# Patient Record
Sex: Male | Born: 1980 | Race: Black or African American | Hispanic: No | Marital: Single | Smoking: Current some day smoker
Health system: Southern US, Community
[De-identification: ages and names within clinical notes are randomized; demographics above are authoritative.]

## PROBLEM LIST (undated history)

## (undated) DIAGNOSIS — I2699 Other pulmonary embolism without acute cor pulmonale: Secondary | ICD-10-CM

## (undated) DIAGNOSIS — J45909 Unspecified asthma, uncomplicated: Secondary | ICD-10-CM

## (undated) DIAGNOSIS — Z789 Other specified health status: Secondary | ICD-10-CM

## (undated) DIAGNOSIS — F1090 Alcohol use, unspecified, uncomplicated: Secondary | ICD-10-CM

## (undated) DIAGNOSIS — K219 Gastro-esophageal reflux disease without esophagitis: Secondary | ICD-10-CM

## (undated) DIAGNOSIS — R569 Unspecified convulsions: Secondary | ICD-10-CM

## (undated) DIAGNOSIS — F419 Anxiety disorder, unspecified: Secondary | ICD-10-CM

## (undated) DIAGNOSIS — I509 Heart failure, unspecified: Secondary | ICD-10-CM

## (undated) DIAGNOSIS — F32A Depression, unspecified: Secondary | ICD-10-CM

## (undated) DIAGNOSIS — Z7289 Other problems related to lifestyle: Secondary | ICD-10-CM

## (undated) HISTORY — DX: Depression, unspecified: F32.A

## (undated) HISTORY — DX: Anxiety disorder, unspecified: F41.9

## (undated) HISTORY — DX: Unspecified convulsions: R56.9

## (undated) HISTORY — PX: WRIST SURGERY: SHX841

## (undated) HISTORY — DX: Other pulmonary embolism without acute cor pulmonale: I26.99

## (undated) HISTORY — DX: Gastro-esophageal reflux disease without esophagitis: K21.9

---

## 2005-08-16 ENCOUNTER — Emergency Department (HOSPITAL_COMMUNITY): Admission: EM | Admit: 2005-08-16 | Discharge: 2005-08-17 | Payer: Self-pay | Admitting: Emergency Medicine

## 2007-10-08 ENCOUNTER — Emergency Department (HOSPITAL_COMMUNITY): Admission: EM | Admit: 2007-10-08 | Discharge: 2007-10-09 | Payer: Self-pay | Admitting: Emergency Medicine

## 2008-02-02 ENCOUNTER — Emergency Department (HOSPITAL_COMMUNITY): Admission: EM | Admit: 2008-02-02 | Discharge: 2008-02-02 | Payer: Self-pay | Admitting: Emergency Medicine

## 2011-11-18 ENCOUNTER — Emergency Department (HOSPITAL_COMMUNITY)
Admission: EM | Admit: 2011-11-18 | Discharge: 2011-11-18 | Disposition: A | Discharge status: Home / Self Care | Payer: Self-pay | Attending: Emergency Medicine | Admitting: Emergency Medicine

## 2011-11-18 ENCOUNTER — Encounter (HOSPITAL_COMMUNITY): Payer: Self-pay | Admitting: Emergency Medicine

## 2011-11-18 DIAGNOSIS — F1010 Alcohol abuse, uncomplicated: Secondary | ICD-10-CM | POA: Insufficient documentation

## 2011-11-18 DIAGNOSIS — R110 Nausea: Secondary | ICD-10-CM | POA: Insufficient documentation

## 2011-11-18 HISTORY — DX: Unspecified asthma, uncomplicated: J45.909

## 2011-11-18 LAB — COMPREHENSIVE METABOLIC PANEL
ALT: 27 U/L (ref 0–53)
AST: 26 U/L (ref 0–37)
Albumin: 4.5 g/dL (ref 3.5–5.2)
Alkaline Phosphatase: 58 U/L (ref 39–117)
BUN: 11 mg/dL (ref 6–23)
CO2: 24 mEq/L (ref 19–32)
Calcium: 9.3 mg/dL (ref 8.4–10.5)
Chloride: 100 mEq/L (ref 96–112)
Creatinine, Ser: 0.88 mg/dL (ref 0.50–1.35)
GFR calc Af Amer: >90 mL/min (ref >90)
GFR calc non Af Amer: >90 mL/min (ref >90)
Glucose, Bld: 97 mg/dL (ref 70–99)
Potassium: 3.2 mEq/L — ABNORMAL LOW (ref 3.5–5.1)
Sodium: 136 mEq/L (ref 135–145)
Total Bilirubin: 0.7 mg/dL (ref 0.3–1.2)
Total Protein: 7.7 g/dL (ref 6.0–8.3)

## 2011-11-18 LAB — RAPID URINE DRUG SCREEN, HOSP PERFORMED
Amphetamines: NOT DETECTED
Barbiturates: NOT DETECTED
Benzodiazepines: NOT DETECTED
Cocaine: NOT DETECTED
Opiates: NOT DETECTED
Tetrahydrocannabinol: POSITIVE — AB

## 2011-11-18 LAB — CBC
HCT: 42.5 % (ref 39.0–52.0)
Hemoglobin: 14.8 g/dL (ref 13.0–17.0)
MCH: 29.7 pg (ref 26.0–34.0)
MCHC: 34.8 g/dL (ref 30.0–36.0)
MCV: 85.3 fL (ref 78.0–100.0)
Platelets: 291 10*3/uL (ref 150–400)
RBC: 4.98 MIL/uL (ref 4.22–5.81)
RDW: 13.2 % (ref 11.5–15.5)
WBC: 5 10*3/uL (ref 4.0–10.5)

## 2011-11-18 LAB — ETHANOL: Alcohol, Ethyl (B): 87 mg/dL — ABNORMAL HIGH (ref 0–11)

## 2011-11-18 NOTE — ED Notes (Signed)
Pt presenting to ed with c/o wanting detox pt states he drinks every other day. Pt states he drinks liquor and beer pt states he drinks a bottle of brandy. Pt states "I either drink brandy or beer" pt states mild abdominal pain. Pt states positive nausea no vomiting.

## 2011-11-18 NOTE — BHH Counselor (Signed)
Received call from patients nurse Anderson Malta stating that she met with patient upon his arrival to the Psych ED. Patient expressed to Anderson Malta that he does not want to be here and is only here b/c his family encourage him to come. Patient asking for referrals and wants to leave the facility. Writer briefly met with patient and provided him with the appropriate referrals  (CD-IOP, residential programs, individual substance abuse therapist/psychiatrist, etc.). Patient discharge home to follow up accordingly.

## 2011-11-18 NOTE — ED Provider Notes (Signed)
History     CSN: [id number]  Arrival date & time 11/18/11  19   First MD Initiated Contact with Patient 11/18/11 1641      Chief Complaint  Patient presents with  . Alcohol Problem    (Consider location/radiation/quality/duration/timing/severity/associated sxs/prior treatment) Patient is a 31 y.o. male presenting with alcohol problem. The history is provided by the patient.  Alcohol Problem This is a new problem. Pertinent negatives include no chest pain, no abdominal pain, no headaches and no shortness of breath.   patient is requesting treatment for alcohol abuse. He states he drinks about a pint of brandy every other day. He drinks beer or brandy. He last drank on Saturday. He is occasional nausea without vomiting. No abdominal pain. He states he does not have difficulty when he doesn't drink. He states he is here because his family states in his treatment. No other drug abuse. No fevers. He has no physical issues because of the drinking per patient. No suicidal or homicidal thoughts. He's not had previous treatment for his drinking. Past Medical History  Diagnosis Date  . Asthma     Past Surgical History  Procedure Date  . Wrist surgery     No family history on file.  History  Substance Use Topics  . Smoking status: Current Everyday Smoker    Types: Cigarettes  . Smokeless tobacco: Not on file  . Alcohol Use: Yes     every other day      Review of Systems  Constitutional: Negative for activity change and appetite change.  HENT: Negative for neck stiffness.   Eyes: Negative for pain.  Respiratory: Negative for chest tightness and shortness of breath.   Cardiovascular: Negative for chest pain and leg swelling.  Gastrointestinal: Positive for nausea. Negative for vomiting, abdominal pain and diarrhea.  Genitourinary: Negative for flank pain.  Musculoskeletal: Negative for back pain.  Skin: Negative for rash.  Neurological: Negative for weakness, numbness and  headaches.  Psychiatric/Behavioral: Negative for behavioral problems.    Allergies  Review of patient's allergies indicates no known allergies.  Home Medications  No current outpatient prescriptions on file.  BP 128/90  Pulse 79  Temp 98.7 F (37.1 C) (Oral)  Resp 20  SpO2 98%  Physical Exam  Nursing note and vitals reviewed. Constitutional: He is oriented to person, place, and time. He appears well-developed and well-nourished.  HENT:  Head: Normocephalic and atraumatic.  Eyes: EOM are normal. Pupils are equal, round, and reactive to light.  Neck: Normal range of motion. Neck supple.  Cardiovascular: Normal rate, regular rhythm and normal heart sounds.   No murmur heard. Pulmonary/Chest: Effort normal and breath sounds normal.  Abdominal: Soft. Bowel sounds are normal. He exhibits no distension and no mass. There is no tenderness. There is no rebound and no guarding.  Musculoskeletal: Normal range of motion. He exhibits no edema.  Neurological: He is alert and oriented to person, place, and time. No cranial nerve deficit.  Skin: Skin is warm and dry.  Psychiatric: He has a normal mood and affect.    ED Course  Procedures (including critical care time)  Labs Reviewed  COMPREHENSIVE METABOLIC PANEL - Abnormal; Notable for the following:    Potassium 3.2 (*)     All other components within normal limits  ETHANOL - Abnormal; Notable for the following:    Alcohol, Ethyl (B) 87 (*)     All other components within normal limits  URINE RAPID DRUG SCREEN (HOSP PERFORMED) - Abnormal;  Notable for the following:    Tetrahydrocannabinol POSITIVE (*)     All other components within normal limits  CBC   No results found.   1. Alcohol abuse       MDM  Patient with alcohol abuse. Patient states he needs treatment, but also states he is just here because his family made him. Patient appears to medically clear. Once patient was in the site cold rooms and before he was seen by  ACT patient requested to leave. Patient was given followup resources. He does not appear to be a risk to himself or others. He appears to have the capacity to make the decision. He was discharged home.        Jasper Riling. Alvino Chapel, MD 11/18/11 [id number]

## 2013-11-03 ENCOUNTER — Emergency Department (HOSPITAL_COMMUNITY)
Admission: EM | Admit: 2013-11-03 | Discharge: 2013-11-03 | Disposition: A | Discharge status: Home / Self Care | Payer: Self-pay | Attending: Emergency Medicine | Admitting: Emergency Medicine

## 2013-11-03 ENCOUNTER — Encounter (HOSPITAL_COMMUNITY): Payer: Self-pay | Admitting: Emergency Medicine

## 2013-11-03 ENCOUNTER — Emergency Department (HOSPITAL_COMMUNITY): Payer: Self-pay

## 2013-11-03 ENCOUNTER — Emergency Department (INDEPENDENT_AMBULATORY_CARE_PROVIDER_SITE_OTHER)
Admission: EM | Admit: 2013-11-03 | Discharge: 2013-11-03 | Disposition: A | Discharge status: Other Acute Inpatient Hospital | Payer: Self-pay | Source: Home / Self Care | Attending: Emergency Medicine | Admitting: Emergency Medicine

## 2013-11-03 DIAGNOSIS — Z720 Tobacco use: Secondary | ICD-10-CM | POA: Diagnosis present

## 2013-11-03 DIAGNOSIS — R072 Precordial pain: Secondary | ICD-10-CM

## 2013-11-03 DIAGNOSIS — R079 Chest pain, unspecified: Secondary | ICD-10-CM | POA: Diagnosis present

## 2013-11-03 DIAGNOSIS — Z79899 Other long term (current) drug therapy: Secondary | ICD-10-CM | POA: Insufficient documentation

## 2013-11-03 DIAGNOSIS — F17200 Nicotine dependence, unspecified, uncomplicated: Secondary | ICD-10-CM | POA: Insufficient documentation

## 2013-11-03 DIAGNOSIS — K2920 Alcoholic gastritis without bleeding: Secondary | ICD-10-CM | POA: Insufficient documentation

## 2013-11-03 DIAGNOSIS — J45909 Unspecified asthma, uncomplicated: Secondary | ICD-10-CM | POA: Insufficient documentation

## 2013-11-03 DIAGNOSIS — R1013 Epigastric pain: Secondary | ICD-10-CM

## 2013-11-03 LAB — COMPREHENSIVE METABOLIC PANEL WITH GFR
ALT: 31 U/L (ref 0–53)
AST: 27 U/L (ref 0–37)
Albumin: 3.6 g/dL (ref 3.5–5.2)
Alkaline Phosphatase: 73 U/L (ref 39–117)
BUN: 10 mg/dL (ref 6–23)
CO2: 24 meq/L (ref 19–32)
Calcium: 8.8 mg/dL (ref 8.4–10.5)
Chloride: 103 meq/L (ref 96–112)
Creatinine, Ser: 0.81 mg/dL (ref 0.50–1.35)
GFR calc Af Amer: >90 mL/min
GFR calc non Af Amer: >90 mL/min
Glucose, Bld: 93 mg/dL (ref 70–99)
Potassium: 4.5 meq/L (ref 3.7–5.3)
Sodium: 139 meq/L (ref 137–147)
Total Bilirubin: <0.2 mg/dL — ABNORMAL LOW (ref 0.3–1.2)
Total Protein: 6.7 g/dL (ref 6.0–8.3)

## 2013-11-03 LAB — CBC WITH DIFFERENTIAL/PLATELET
Basophils Absolute: 0 10*3/uL (ref 0.0–0.1)
Basophils Absolute: 0 10*3/uL (ref 0.0–0.1)
Basophils Relative: 1 % (ref 0–1)
Basophils Relative: 0 % (ref 0–1)
Eosinophils Absolute: 0.2 10*3/uL (ref 0.0–0.7)
Eosinophils Absolute: 0.1 10*3/uL (ref 0.0–0.7)
Eosinophils Relative: 2 % (ref 0–5)
Eosinophils Relative: 2 % (ref 0–5)
HCT: 39.3 % (ref 39.0–52.0)
HCT: 37.9 % — ABNORMAL LOW (ref 39.0–52.0)
Hemoglobin: 13.2 g/dL (ref 13.0–17.0)
Hemoglobin: 12.9 g/dL — ABNORMAL LOW (ref 13.0–17.0)
Lymphocytes Relative: 14 % (ref 12–46)
Lymphocytes Relative: 16 % (ref 12–46)
Lymphs Abs: 1.2 10*3/uL (ref 0.7–4.0)
Lymphs Abs: 1.3 10*3/uL (ref 0.7–4.0)
MCH: 29.3 pg (ref 26.0–34.0)
MCH: 29.5 pg (ref 26.0–34.0)
MCHC: 33.6 g/dL (ref 30.0–36.0)
MCHC: 34 g/dL (ref 30.0–36.0)
MCV: 87.3 fL (ref 78.0–100.0)
MCV: 86.5 fL (ref 78.0–100.0)
Monocytes Absolute: 0.9 10*3/uL (ref 0.1–1.0)
Monocytes Absolute: 0.9 10*3/uL (ref 0.1–1.0)
Monocytes Relative: 11 % (ref 3–12)
Monocytes Relative: 11 % (ref 3–12)
Neutro Abs: 6.3 10*3/uL (ref 1.7–7.7)
Neutro Abs: 5.6 10*3/uL (ref 1.7–7.7)
Neutrophils Relative %: 72 % (ref 43–77)
Neutrophils Relative %: 71 % (ref 43–77)
Platelets: 257 10*3/uL (ref 150–400)
Platelets: 250 10*3/uL (ref 150–400)
RBC: 4.5 MIL/uL (ref 4.22–5.81)
RBC: 4.38 MIL/uL (ref 4.22–5.81)
RDW: 13.2 % (ref 11.5–15.5)
RDW: 13.1 % (ref 11.5–15.5)
WBC: 8.6 10*3/uL (ref 4.0–10.5)
WBC: 7.9 10*3/uL (ref 4.0–10.5)

## 2013-11-03 LAB — COMPREHENSIVE METABOLIC PANEL
ALT: 33 U/L (ref 0–53)
AST: 30 U/L (ref 0–37)
Albumin: 3.8 g/dL (ref 3.5–5.2)
Alkaline Phosphatase: 82 U/L (ref 39–117)
BUN: 11 mg/dL (ref 6–23)
CO2: 21 mEq/L (ref 19–32)
Calcium: 8.8 mg/dL (ref 8.4–10.5)
Chloride: 102 mEq/L (ref 96–112)
Creatinine, Ser: 0.89 mg/dL (ref 0.50–1.35)
GFR calc Af Amer: >90 mL/min (ref >90)
GFR calc non Af Amer: >90 mL/min (ref >90)
Glucose, Bld: 154 mg/dL — ABNORMAL HIGH (ref 70–99)
Potassium: 4.1 mEq/L (ref 3.7–5.3)
Sodium: 139 mEq/L (ref 137–147)
Total Bilirubin: <0.2 mg/dL — ABNORMAL LOW (ref 0.3–1.2)
Total Protein: 6.9 g/dL (ref 6.0–8.3)

## 2013-11-03 LAB — TROPONIN I
Troponin I: <0.3 ng/mL (ref <0.30)
Troponin I: <0.3 ng/mL (ref <0.30)

## 2013-11-03 LAB — POCT H PYLORI SCREEN: H. PYLORI SCREEN, POC: NEGATIVE

## 2013-11-03 LAB — POC OCCULT BLOOD, ED: Fecal Occult Bld: NEGATIVE

## 2013-11-03 LAB — LIPASE, BLOOD
Lipase: 15 U/L (ref 11–59)
Lipase: 14 U/L (ref 11–59)

## 2013-11-03 LAB — OCCULT BLOOD, POC DEVICE: Fecal Occult Bld: NEGATIVE

## 2013-11-03 MED ORDER — NITROGLYCERIN 0.4 MG SL SUBL: 0.4000 mg | SUBLINGUAL_TABLET | SUBLINGUAL | Status: DC | PRN

## 2013-11-03 MED ORDER — SODIUM CHLORIDE 0.9 % IV SOLN
INTRAVENOUS | Status: DC
  Administered 2013-11-03: 10:00:00 via INTRAVENOUS

## 2013-11-03 MED ORDER — OMEPRAZOLE 20 MG PO CPDR
20.0000 mg | DELAYED_RELEASE_CAPSULE | Freq: Every day | ORAL | Status: DC
Start: 2013-11-03 — End: 2018-03-02

## 2013-11-03 MED ORDER — NITROGLYCERIN 0.4 MG SL SUBL
SUBLINGUAL_TABLET | SUBLINGUAL | Status: AC
  Filled 2013-11-03: qty 1

## 2013-11-03 MED ORDER — GI COCKTAIL ~~LOC~~
30.0000 mL | Freq: Once | ORAL | Status: AC
  Administered 2013-11-03: 30 mL via ORAL

## 2013-11-03 MED ORDER — GI COCKTAIL ~~LOC~~
ORAL | Status: AC
  Filled 2013-11-03: qty 30

## 2013-11-03 MED ORDER — SODIUM CHLORIDE 0.9 % IV SOLN
80.0000 mg | Freq: Once | INTRAVENOUS | Status: AC
  Administered 2013-11-03: 80 mg via INTRAVENOUS
  Filled 2013-11-03: qty 80

## 2013-11-03 NOTE — Discharge Instructions (Signed)
1. Medications: omeprazole, usual home medications 2. Treatment: rest, drink plenty of fluids, stop drinking and stop smoking; no more ibuprofen use 3. Follow Up: Please followup with gastroenterology within 1 week for discussion of your diagnoses and further evaluation after today's visit; if you do not have a primary care doctor use the resource guide provided to find one;    Gastritis, Adult Gastritis is soreness and swelling (inflammation) of the lining of the stomach. Gastritis can develop as a sudden onset (acute) or long-term (chronic) condition. If gastritis is not treated, it can lead to stomach bleeding and ulcers. CAUSES  Gastritis occurs when the stomach lining is weak or damaged. Digestive juices from the stomach then inflame the weakened stomach lining. The stomach lining may be weak or damaged due to viral or bacterial infections. One common bacterial infection is the Helicobacter pylori infection. Gastritis can also result from excessive alcohol consumption, taking certain medicines, or having too much acid in the stomach.  SYMPTOMS  In some cases, there are no symptoms. When symptoms are present, they may include:  Pain or a burning sensation in the upper abdomen.  Nausea.  Vomiting.  An uncomfortable feeling of fullness after eating. DIAGNOSIS  Your caregiver may suspect you have gastritis based on your symptoms and a physical exam. To determine the cause of your gastritis, your caregiver may perform the following:  Blood or stool tests to check for the H pylori bacterium.  Gastroscopy. A thin, flexible tube (endoscope) is passed down the esophagus and into the stomach. The endoscope has a light and camera on the end. Your caregiver uses the endoscope to view the inside of the stomach.  Taking a tissue sample (biopsy) from the stomach to examine under a microscope. TREATMENT  Depending on the cause of your gastritis, medicines may be prescribed. If you have a bacterial  infection, such as an H pylori infection, antibiotics may be given. If your gastritis is caused by too much acid in the stomach, H2 blockers or antacids may be given. Your caregiver may recommend that you stop taking aspirin, ibuprofen, or other nonsteroidal anti-inflammatory drugs (NSAIDs). HOME CARE INSTRUCTIONS  Only take over-the-counter or prescription medicines as directed by your caregiver.  If you were given antibiotic medicines, take them as directed. Finish them even if you start to feel better.  Drink enough fluids to keep your urine clear or pale yellow.  Avoid foods and drinks that make your symptoms worse, such as:  Caffeine or alcoholic drinks.  Chocolate.  Peppermint or mint flavorings.  Garlic and onions.  Spicy foods.  Citrus fruits, such as oranges, lemons, or limes.  Tomato-based foods such as sauce, chili, salsa, and pizza.  Fried and fatty foods.  Eat small, frequent meals instead of large meals. SEEK IMMEDIATE MEDICAL CARE IF:   You have black or dark red stools.  You vomit blood or material that looks like coffee grounds.  You are unable to keep fluids down.  Your abdominal pain gets worse.  You have a fever.  You do not feel better after 1 week.  You have any other questions or concerns. MAKE SURE YOU:  Understand these instructions.  Will watch your condition.  Will get help right away if you are not doing well or get worse. Document Released: 04/23/2001 Document Revised: 10/29/2011 Document Reviewed: 06/12/2011 [facility] Patient Information 2015 [place], [place]. This information is not intended to replace advice given to you by your health care provider. Make sure you discuss any  questions you have with your health care provider.    Emergency Department Resource Guide 1) Find a Doctor and Pay Out of Pocket Although you won't have to find out who is covered by your insurance plan, it is a good idea to ask around and get  recommendations. You will then need to call the office and see if the doctor you have chosen will accept you as a new patient and what types of options they offer for patients who are self-pay. Some doctors offer discounts or will set up payment plans for their patients who do not have insurance, but you will need to ask so you aren't surprised when you get to your appointment.  2) Contact Your Local Health Department Not all health departments have doctors that can see patients for sick visits, but many do, so it is worth a call to see if yours does. If you don't know where your local health department is, you can check in your phone book. The CDC also has a tool to help you locate your state's health department, and many state websites also have listings of all of their local health departments.  3) Find a [facility] If your illness is not likely to be very severe or complicated, you may want to try a walk in clinic. These are popping up all over the country in pharmacies, drugstores, and shopping centers. They're usually staffed by nurse practitioners or physician assistants that have been trained to treat common illnesses and complaints. They're usually fairly quick and inexpensive. However, if you have serious medical issues or chronic medical problems, these are probably not your best option.  No Primary Care Doctor: - Call Health Connect at  [phone] - they can help you locate a primary care doctor that  accepts your insurance, provides certain services, etc. - Physician Referral Service- [phone]  Chronic Pain Problems: Organization         Address  Phone   Notes  [facility]  [phone] Patients need to be referred by their primary care doctor.   Medication Assistance: Organization         Address  Phone   Notes  [facility] Medication [facility] [address] [place], [place] [address] [phone] --Must be a resident of  [facility] -- Must have NO insurance coverage whatsoever (no Medicaid/ Medicare, etc.) -- The pt. MUST have a primary care doctor that directs their care regularly and follows them in the community   [facility]ist  [phone]   [employer]  [phone]    Agencies that provide inexpensive medical care: Organization         Address  Phone   Notes  [place]  [phone]   [facility]    [phone]   [facility] [place], [place] [address] [phone]   [place] [address], [place] [phone]   Planned Parenthood    [phone]   [facility]    [phone]   [place] and [address], [place] Phone:  [phone], Fax:  [phone] Hours of Operation:  9 am - 6 pm, M-F.  Also accepts Medicaid/Medicare and self-pay.  [facility] for [place], [address],  Phone: [phone], Fax: [phone]. Hours of Operation:  8:30 am - 5:30 pm,  M-F.  Also accepts Medicaid and self-pay.  [facility] [place] [address] [place] Phone: [phone]   [place], [place], [place] [phone], Ext. [phone] Mondays & Thursdays: 7-9 AM.  First 15 patients are seen on a first come, first serve basis.    [place] Providers:  Organization         Address  Phone   Notes  [facility] [address], [place] [phone] Also accepts self-pay patients.  [facility] [id number] [address], [place]  [phone]   [place], [address], [place] [phone]   [facility] [address] [place] [phone]   Lucianne Lei [address], [place]   [phone] Only accepts [place] Access  [place] patients after they have their name applied to their card.   Self-Pay (no insurance) in [facility]:  Organization         Address  Phone   Notes  Sickle Cell Patients, [facility] Internal Medicine [place] [phone]   [facility] [place] [phone]   [facility] [place]  [place], [place], [place] [phone]   [facility]/Dr. Osei-Bonsu  [address] [place] or [address], [place] [phone] Phone number for both [place] and [place] locations is the same.  Urgent Medical and [facility] [address], [place] [phone]   [facility] [address] [place] or [address] [phone]   [facility] [address], [place] [phone], phone; [phone], fax Sees patients 1st and 3rd Saturday of every month.  Must not qualify for public or private insurance (i.e. Medicaid, Medicare, [place] Health Choice, Veterans' Benefits)  Household income should be no more than 200% of the poverty level The clinic cannot treat you if you are pregnant or think you are pregnant  Sexually transmitted diseases are not treated at the clinic.    Dental Care: Organization         Address  Phone  Notes  [facility] Department of [facility] [place] [phone] Accepts children up to age 70 who are enrolled in [place] or [place]; pregnant women with a Medicaid card; and children who have applied for Medicaid or [place] Health Choice, but were declined, whose parents can pay a reduced fee at time of service.  [facility] Department of [facility]  [address], [place] [phone] Accepts children up to age 61 who are enrolled in [place] or [place]; pregnant women with a Medicaid  card; and children who have applied for Medicaid or [place] Health Choice, but were declined, whose parents can pay a reduced fee at time of service.  [facility] Adult [facility] Access PROGRAM  [place] [phone] Patients are seen by appointment only. Walk-ins are not accepted. [place] will see patients 38 years of age and older. Monday - Tuesday (8am-5pm) Most Wednesdays (8:30-5pm) $30 per visit, cash only  [facility] Adult Dental Access PROGRAM  [address], [facility] [phone] Patients are seen by appointment only. Walk-ins are not accepted.  [place] will see patients 59 years of age and older. One Wednesday Evening (Monthly: Volunteer Based).  $30 per visit, cash only  [place]  [phone] for adults; Children under age 68, call Graduate Pediatric Dentistry at [phone]. Children aged 33-14, please call [phone] to request a pediatric application.  Dental services are provided in all areas of dental care including fillings, crowns and bridges, complete and partial dentures, implants, gum treatment, root canals, and extractions. Preventive care is also provided. Treatment is provided to both adults and children. Patients are selected via a lottery and there is often a waiting list.   [facility] [address], [place]  [phone] [email or web address]   [facility] [address] [place], [place] [phone], Ext. [phone] Second and Fourth Thursday of each month, opens at 6:30 AM; Clinic ends at 9 AM.  Patients are seen on a first-come first-served basis, and a limited number are seen during each clinic.   [facility]  [address] Hillard Danker [place], [place] [phone]   Eligibility Requirements You must have lived in [place], [place], or [place] counties for at least the last three months.   You cannot be eligible for state or federal sponsored [employer],  including [employer], [place], or [employer].   You generally cannot be eligible for healthcare insurance through your employer.    How to apply: Eligibility screenings are held every Tuesday and Wednesday afternoon from 1:00 pm until 4:00 pm. You do not need an appointment for the interview!  [facility] [address], [place], [place]   [place]  [place] Department  [place]  [phone]    Behavioral Health Resources in the Community: Intensive Outpatient Programs Organization         Address  Phone  Notes  [place]. [address], [place], [place] [phone]   [facility] Outpatient [address] [place], [place]   ADS: Alcohol & Drug Svcs [address] [place], [place]   [place] [address]  [place], [place] or [phone]   Substance Abuse Resources Organization         Address  Phone  Notes  Alcohol and Drug Services  [phone]   [place]  [phone]   [facility] [place]   Chinita Pester  [phone]   Residential & Outpatient Substance Abuse Program  [phone]   Psychological Services Organization         Address  Phone  Notes  [facility]  [place]  [phone]   [place] [address], [place] or [phone]    Mobile Crisis Teams Organization         Address  Phone  Notes  [facility], Mobile Crisis Care Unit  [phone]   [facility] Psychotherapeutic [facility]  [address] [place], [place]   [place] [address] [place] [phone]    Self-Help/Support Groups Organization         Address  Phone             Notes  [place]  of [place] - variety of support groups  [place] Call for more information  Narcotics Anonymous (NA), Caring Services  [place], [place]  2 meetings at this location   Residential Facilities manager         Address  Phone  Notes  [facility] Treatment [place],    [place]  [phone]   [facility]  [address], [place] [id number], [place], [place]   [place], [place] [phone] Admissions: 8am-3pm M-F  Incentives Substance [place] [address]    [place], [place] [phone]   [facility] [address] [place], [place], [place]   [facility] [address],  [place], [place]   [facility] - Intensive Outpatient [address] Kristeen [address], [place], [place]   [facility] ([place].,  [place], [place] [phone] or [phone]   [facility] Treatment Services (RTS) [address], [place], [place] Accepts Medicaid  [facility] [place] [address],  [place] [phone] Substance Abuse/Addiction Treatment   [facility] Organization         Address  Phone  Notes  [facility]  [phone]   Domenic Schwab, PhD [address] Arlis Porta [place], [place]   [phone] or [phone]   [place], [place] [phone]   [facility] [address] [place], [place] [phone] Insurance/Medicaid/sponsorship through [facility] and Families [address] [place]                                    [place], [place] [phone] [place] [address], [place] [phone]    Dr. Adele Schilder  [phone]   [facility] Dept. 1) [address], [place] 2)  [place] 3)  [place] [address], [place] [phone]  [phone]   [place] [phone] or [phone] (After Hours)

## 2013-11-03 NOTE — Discharge Instructions (Signed)
We have determined that your problem requires further evaluation in the emergency department.  We will take care of your transport there.  Once at the emergency department, you will be evaluated by a provider and they will order whatever treatment or tests they deem necessary.  We cannot guarantee that they will do any specific test or do any specific treatment.  ° °

## 2013-11-03 NOTE — ED Notes (Addendum)
Pt states that he drinks beer daily to every other day and has abdominal pain the next day after. Pt states that he also has noticed that he has blood in his vomit and once in his stool. Pt states that he also takes ibuprofen on a regular basis as well with or without food.

## 2013-11-03 NOTE — ED Notes (Signed)
C/o has had several week duration of abdominal pain, nausea, vomiting, spitting up blood, stool color changes. Drinks a couple of beers a day. NAD at present

## 2013-11-03 NOTE — ED Provider Notes (Signed)
CSN: [id number]     Arrival date & time 11/03/13  1014 History   First MD Initiated Contact with Patient 11/03/13 1023     Chief Complaint  Patient presents with  . Abdominal Pain     (Consider location/radiation/quality/duration/timing/severity/associated sxs/prior Treatment) Patient is a 33 y.o. male presenting with abdominal pain. The history is provided by the patient and medical records. No language interpreter was used.  Abdominal Pain Associated symptoms: chest pain, nausea and vomiting   Associated symptoms: no constipation, no cough, no diarrhea, no dysuria, no fatigue, no fever, no hematuria and no shortness of breath     Robert [name] is a 33 y.o. male  with a hx of asthma presents to the Emergency Department from [facility] complaining of intermittent epigastric pain rating along the costal margins in the low sternal area onset proximally one month ago. Patient reports the pain occurs upon waking the morning after drinking beer lasting for several hours and resolving spontaneously. He describes it as a burning sensation, rated at a 5/10.  He reports he is drinking 3-4, 24 ounce beers every day or every other day for the last several years. He reports he takes ibuprofen almost daily without food and drinks at least one rib bullet everyday.  He reports that he intermittently vomits and occasionally it is streaked with bright red blood after several bouts of emesis. He reports approximately a 5 pound weight loss in the last several months due to poor appetite.  He denies dyspnea on exertion or shortness of breath.  He denies any personal cardiac history. He reports his father had an MI at the age of 58 but no family history of early cardiac disease or sudden cardiac death.  Beer aggravates the problem and there are no alleviating factors the ibuprofen does sometimes help.  Pt denies fever, chills, headache, neck pain, diarrhea, weakness, dizziness, syncope, hematuria. He reports a  history of constipation and one episode of bright red blood in his stool greater than one month ago without recurrence. He denies, or hematochezia.  He denies history of peptic ulcer disease or any other GI problems.   Past Medical History  Diagnosis Date  . Asthma    Past Surgical History  Procedure Laterality Date  . Wrist surgery     Family History  Problem Relation Age of Onset  . Heart failure Father    History  Substance Use Topics  . Smoking status: Current Every Day Smoker -- 1.00 packs/day    Types: Cigarettes  . Smokeless tobacco: Not on file  . Alcohol Use: Yes     Comment: every other day.  2-3, 20 ounce beers     Review of Systems  Constitutional: Negative for fever, diaphoresis, appetite change, fatigue and unexpected weight change.  HENT: Negative for mouth sores.   Eyes: Negative for visual disturbance.  Respiratory: Negative for cough, chest tightness, shortness of breath and wheezing.   Cardiovascular: Positive for chest pain.  Gastrointestinal: Positive for nausea, vomiting and abdominal pain. Negative for diarrhea and constipation.  Endocrine: Negative for polydipsia, polyphagia and polyuria.  Genitourinary: Negative for dysuria, urgency, frequency and hematuria.  Musculoskeletal: Negative for back pain and neck stiffness.  Skin: Negative for rash.  Allergic/Immunologic: Negative for immunocompromised state.  Neurological: Negative for syncope, light-headedness and headaches.  Hematological: Does not bruise/bleed easily.  Psychiatric/Behavioral: Negative for sleep disturbance. The patient is not nervous/anxious.       Allergies  Review of patient's allergies indicates  no known allergies.  Home Medications   Prior to Admission medications   Medication Sig Start Date End Date Taking? Authorizing Provider  ibuprofen (ADVIL,MOTRIN) 200 MG tablet Take 400 mg by mouth 2 (two) times daily as needed (pain).   Yes Historical Provider, MD  omeprazole  (PRILOSEC) 20 MG capsule Take 1 capsule (20 mg total) by mouth daily. 11/03/13   Izzie Geers, PA-C   BP 138/114  Pulse 65  Resp 21  SpO2 99% Physical Exam  Nursing note and vitals reviewed. Constitutional: He is oriented to person, place, and time. He appears well-developed and well-nourished. No distress.  Awake, alert, nontoxic appearance  HENT:  Head: Normocephalic and atraumatic.  Mouth/Throat: Oropharynx is clear and moist. No oropharyngeal exudate.  Eyes: Conjunctivae are normal. No scleral icterus.  Neck: Normal range of motion. Neck supple.  Cardiovascular: Normal rate, regular rhythm, normal heart sounds and intact distal pulses.   No murmur heard. Regular rate and rhythm without murmurs or gallops  Pulmonary/Chest: Effort normal and breath sounds normal. No respiratory distress. He has no wheezes.  Clear and equal breath sounds  Abdominal: Soft. Bowel sounds are normal. He exhibits no distension and no mass. There is tenderness. There is no rebound and no guarding.  Very mild epigastric tenderness without guarding, rebound or peritoneal signs  Musculoskeletal: Normal range of motion. He exhibits no edema.  Neurological: He is alert and oriented to person, place, and time. He exhibits normal muscle tone. Coordination normal.  Speech is clear and goal oriented Moves extremities without ataxia  Skin: Skin is warm and dry. He is not diaphoretic. No erythema.  Psychiatric: He has a normal mood and affect.    ED Course  Procedures (including critical care time) Labs Review Labs Reviewed  CBC WITH DIFFERENTIAL - Abnormal; Notable for the following:    Hemoglobin 12.9 (*)    HCT 37.9 (*)    All other components within normal limits  COMPREHENSIVE METABOLIC PANEL - Abnormal; Notable for the following:    Total Bilirubin <0.2 (*)    All other components within normal limits  LIPASE, BLOOD  TROPONIN I  TROPONIN I  POC OCCULT BLOOD, ED    Imaging Review Dg Chest 2  View  11/03/2013   CLINICAL DATA:  Chest pain  EXAM: CHEST  2 VIEW  COMPARISON:  10/08/2007  FINDINGS: Cardiac shadow is at the upper limits of normal in size. The lungs are clear bilaterally. No focal infiltrate or sizable effusion is seen.  IMPRESSION: No active cardiopulmonary disease.   Electronically Signed   By: Inez Catalina M.D.   On: 11/03/2013 12:10     EKG Interpretation   Date/Time:  Wednesday November 03 2013 10:17:00 EDT Ventricular Rate:  71 PR Interval:  166 QRS Duration: 85 QT Interval:  393 QTC Calculation: 427 R Axis:   61 Text Interpretation:  Sinus arrhythmia Abnormal T, consider ischemia,  anterior leads Confirmed by Betsey Holiday  MD, CHRISTOPHER [phone]) on 11/03/2013  12:29:37 PM      MDM   Final diagnoses:  Chest pain, unspecified chest pain type  Alcoholic gastritis   Sterling Kearney presents from [facility] with epigastric pain radiating along the costal margins and into the lower sternum always associated with alcohol intake. Patient also with risk factors for pedicles at disease with persistent ibuprofen and taken in the stomach and red bull.  Likely alcoholic gastritis however will proceed with cardiac workup and consult with cardiology and GI.  12:59  PM Clinical negative, CBC with mild anemia at 12.9, CMP and lipase within normal limits. First troponin negative.  Chest x-ray without evidence of pulmonary edema, pneumothorax or pneumonia.  Pt is now pain free after protonix administration.   I still believe that patient's symptoms are likely pedicles at disease versus her chronic gastritis however patient continues to have abnormal T-wave inversion specifically in the anterior leads. Discussed with cardiology who will consult to help make admission versus outpatient followup decision. Delta troponin pending.    2:04 PM Consult with Dr. Gwenlyn Found of cardiology: "Young BM with minimal CRF and 1 month H/O atypical epigastric pain. Currently pain free. Exam  benign. Enz neg. EKG notable with LVH with strain. Suspect pain is not ischemic but rather gastrointestinal ( PUD, gastritis, esophagitis). LFTs not elevated. Can be D/Cd on PPI. No cardiology F/U necessary. Needs to stop smoking and drinking."  Patient is to be discharged with recommendation to follow up with PCP in regards to today's hospital visit. Chest pain is not likely of cardiac or pulmonary etiology d/t presentation, PERC negative, VSS, no tracheal deviation, no JVD or new murmur, RRR, breath sounds equal bilaterally, EKG with LVH with strain, negative delta troponin, and negative CXR. Pt has been advised start a PPI and return to the ED if CP becomes exertional, associated with diaphoresis or nausea, radiates to left jaw/arm, worsens or becomes concerning in any way. Pt appears reliable for follow up and is agreeable to discharge.   Case has been discussed with and seen by Dr. Betsey Holiday who agrees with the above plan to discharge.    Robert Soho Alphonsa Brickle, PA-C 11/03/13 1410

## 2013-11-03 NOTE — ED Provider Notes (Signed)
Medical screening examination/treatment/procedure(s) were conducted as a shared visit with non-physician practitioner(s) and myself.  I personally evaluated the patient during the encounter.   EKG Interpretation   Date/Time:  Wednesday November 03 2013 10:17:00 EDT Ventricular Rate:  71 PR Interval:  166 QRS Duration: 85 QT Interval:  393 QTC Calculation: 427 R Axis:   61 Text Interpretation:  Sinus arrhythmia Abnormal T, consider ischemia,  anterior leads Confirmed by Connie Hilgert  MD, Maheen Cwikla [phone]) on 11/03/2013  12:29:37 PM     Presented to the ER for evaluation of abdominal discomfort, chest discomfort, nausea and vomiting which is occasionally bloody. Symptoms felt to be consistent with gastritis secondary to alcohol, possible peptic ulcer disease. EKG abnormal, diffusely inverted T waves. Cardiology consult evaluated, have cleared the patient for discharge. He will be referred to GI for further workup.  Orpah Greek, MD 11/03/13 1415

## 2013-11-03 NOTE — ED Provider Notes (Signed)
Chief Complaint   Chief Complaint  Patient presents with  . Abdominal Pain    History of Present Illness   Robert [name] is a 33 year old male who has had a one-month history of recurring epigastric pain radiating along both costal margins and into his lower sternal area. It does not radiate through the back. The pain can last hours to days at a time. It's sharp and rated 9/10 in intensity at the most, now down to a 5/10 in intensity. It's associated with nausea and vomiting. Sometimes the vomitus contained streaks of bright red blood. He's had poor appetite and lost about 5 pounds. He's had some constipation and has seen streaks of bright red blood on his stool as well. The pain is worse if he drinks too much. It's not related to food, to eating, or to exertion. He denies any fever or chills. He had some burning with urination about a month ago but none recently. He has had some sweats. He has no cardiac history and no history of GI problems in the past. He smokes a pack of cigarettes a day and admits to drinking 2 beers per day.  Review of Systems   Other than as noted above, the patient denies any of the following symptoms: Constitutional:  No fever, chills, weight loss or anorexia. Abdomen:  No nausea, vomiting, hematememesis, melena, diarrhea, or hematochezia. GU:  No dysuria, frequency, urgency, or hematuria.  No testicular pain or swelling.  [place]   Past medical history, family history, social history, meds, and allergies were reviewed. He has a history of asthma.  Physical Examination     Vital signs:  BP 142/87  Pulse 93  Temp(Src) 98.6 F (37 C) (Oral)  Resp 12  SpO2 98% Gen:  Alert, oriented, in no distress. Lungs:  Breath sounds clear and equal bilaterally.  No wheezes, rales or rhonchi. Heart:  Regular rhythm.  No gallops or murmers.   Abdomen:  Abdomen is soft, flat, nondistended. No is no organomegaly or mass. Bowel sounds are normally active. He has mild pain to  palpation in the epigastrium without guarding or rebound. Murphy sign and Murphy's punch were negative. Rectal exam:  No masses, normal prostate, heme negative stools. Skin:  Clear, warm and dry.  No rash.  Labs   Results for orders placed during the hospital encounter of 11/03/13  OCCULT BLOOD, POC DEVICE      Result Value Ref Range   Fecal Occult Bld NEGATIVE  NEGATIVE  POCT H PYLORI SCREEN      Result Value Ref Range   H. PYLORI SCREEN, POC NEGATIVE  NEGATIVE    A CBC, complete metabolic panel, and lipase have also been ordered and results are pending as of this time.  EKG Results:  Date: 11/03/2013  Rate: 87  Rhythm: normal sinus rhythm  QRS Axis: normal  Intervals: normal  ST/T Wave abnormalities: nonspecific T wave changes  Conduction Disutrbances:none  Narrative Interpretation: Normal sinus rhythm, T-wave inversions in leads V3 through V6 consistent with anterolateral ischemia.  Old EKG Reviewed: none available   Course in Urgent [place]   The following medications were given:  Medications  0.9 %  sodium chloride infusion (not administered)  nitroGLYCERIN (NITROSTAT) SL tablet 0.4 mg (not administered)  gi cocktail (Maalox,Lidocaine,Donnatal) (30 mLs Oral Given 11/03/13 0915)   The patient was also given aspirin 325 mg by mouth.  Assessment   The primary encounter diagnosis was Epigastric pain. A diagnosis of Precordial pain was also  pertinent to this visit.  With the epigastric and substernal chest pain and abnormal EKG, intermediate cardiac syndrome needs to be ruled out. Other possibilities include esophagitis, gastritis, ulcer, gallstones, liver problems, or pancreatitis.  Plan     The patient was transferred to the ED via [place] in stable condition.  Medical Decision Making:  33 year old male smoker has a 1 month history of intermittent epigastric and lower sternal pain.  No shortness of breath, but has had some diaphoresis, nausea and vomiting.  EKG  shows T inversions in V3 to V6. Raising the possibility of angina pectoris.  Will send via [facility].          Harden Mo, MD 11/03/13 [phone]

## 2013-11-03 NOTE — ED Notes (Signed)
Oxygen @2liters /min nasal cannula applied , and discussed need for treatment. Went in to check on patient, and he had self d/c O2, as it was uncomfortable. Regarding GI cocktail, patient states pain went from "4: to "2". Given NTG

## 2013-11-03 NOTE — ED Notes (Addendum)
Pt sent from urgent care for evaluation of epigastric pain, nausea and vomiting. Pt was given 1 nitro prior to arrival. Pt states that he is pain free expect for a headache. Pt received GI cocktail

## 2013-11-03 NOTE — Consult Note (Signed)
Reason for Consult:  EKG changes/chest pain Referring Physician: ER  Korde Jeppsen is an 33 y.o. male.  HPI:  The patient is a 33 yo male with a history of tobacco abuse and asthma.  He drinks 2-3, 20oz beers per day.  His father had an MI at age 22 or 84. He reports having chest pain for the last month off and on.  It is not associated with exertion.  He describes it as "sharp".  No radiation.  He has had nausea and vomited.  Some hematemesis and hematochezia.  No melena, SOB, diaphoresis, LEE, orthopnea, PND, ABD pain.   He works for a [employer] and exerts himself frequently.  He notices the pain the morning after drinking more so.  He does takes ibuprofen 2-3 times per week for right arm pain or headache.    Past Medical History  Diagnosis Date  . Asthma     Past Surgical History  Procedure Laterality Date  . Wrist surgery      Family History  Problem Relation Age of Onset  . Heart failure Father     Social History:  reports that he has been smoking Cigarettes.  He has been smoking about 1.00 pack per day. He does not have any smokeless tobacco history on file. He reports that he drinks alcohol. He reports that he uses illicit drugs (Marijuana).  Allergies: No Known Allergies  Medications: Prior to Admission medications   Medication Sig Start Date End Date Taking? Authorizing Provider  ibuprofen (ADVIL,MOTRIN) 200 MG tablet Take 400 mg by mouth 2 (two) times daily as needed (pain).   Yes Historical Provider, MD     Results for orders placed during the hospital encounter of 11/03/13 (from the past 48 hour(s))  CBC WITH DIFFERENTIAL     Status: Abnormal   Collection Time    11/03/13 10:40 AM      Result Value Ref Range   WBC 7.9  4.0 - 10.5 K/uL   RBC 4.38  4.22 - 5.81 MIL/uL   Hemoglobin 12.9 (*) 13.0 - 17.0 g/dL   HCT 37.9 (*) 39.0 - 52.0 %   MCV 86.5  78.0 - 100.0 fL   MCH 29.5  26.0 - 34.0 pg   MCHC 34.0  30.0 - 36.0 g/dL   RDW 13.1  11.5 - 15.5 %   Platelets 250  150 - 400 K/uL   Neutrophils Relative % 71  43 - 77 %   Neutro Abs 5.6  1.7 - 7.7 K/uL   Lymphocytes Relative 16  12 - 46 %   Lymphs Abs 1.3  0.7 - 4.0 K/uL   Monocytes Relative 11  3 - 12 %   Monocytes Absolute 0.9  0.1 - 1.0 K/uL   Eosinophils Relative 2  0 - 5 %   Eosinophils Absolute 0.1  0.0 - 0.7 K/uL   Basophils Relative 0  0 - 1 %   Basophils Absolute 0.0  0.0 - 0.1 K/uL  COMPREHENSIVE METABOLIC PANEL     Status: Abnormal   Collection Time    11/03/13 10:40 AM      Result Value Ref Range   Sodium 139  137 - 147 mEq/L   Potassium 4.5  3.7 - 5.3 mEq/L   Chloride 103  96 - 112 mEq/L   CO2 24  19 - 32 mEq/L   Glucose, Bld 93  70 - 99 mg/dL   BUN 10  6 - 23 mg/dL  Creatinine, Ser 0.81  0.50 - 1.35 mg/dL   Calcium 8.8  8.4 - 10.5 mg/dL   Total Protein 6.7  6.0 - 8.3 g/dL   Albumin 3.6  3.5 - 5.2 g/dL   AST 27  0 - 37 U/L   ALT 31  0 - 53 U/L   Alkaline Phosphatase 73  39 - 117 U/L   Total Bilirubin <0.2 (*) 0.3 - 1.2 mg/dL   GFR calc non Af Amer >90  >90 mL/min   GFR calc Af Amer >90  >90 mL/min   Comment: (NOTE)     The eGFR has been calculated using the CKD EPI equation.     This calculation has not been validated in all clinical situations.     eGFR's persistently <90 mL/min signify possible Chronic Kidney     Disease.  LIPASE, BLOOD     Status: None   Collection Time    11/03/13 10:40 AM      Result Value Ref Range   Lipase 14  11 - 59 U/L  TROPONIN I     Status: None   Collection Time    11/03/13 10:40 AM      Result Value Ref Range   Troponin I <0.30  <0.30 ng/mL   Comment:            Due to the release kinetics of cTnI,     a negative result within the first hours     of the onset of symptoms does not rule out     myocardial infarction with certainty.     If myocardial infarction is still suspected,     repeat the test at appropriate intervals.  POC OCCULT BLOOD, ED     Status: None   Collection Time    11/03/13 11:12 AM      Result  Value Ref Range   Fecal Occult Bld NEGATIVE  NEGATIVE  TROPONIN I     Status: None   Collection Time    11/03/13 12:38 PM      Result Value Ref Range   Troponin I <0.30  <0.30 ng/mL   Comment:            Due to the release kinetics of cTnI,     a negative result within the first hours     of the onset of symptoms does not rule out     myocardial infarction with certainty.     If myocardial infarction is still suspected,     repeat the test at appropriate intervals.    Dg Chest 2 View  11/03/2013   CLINICAL DATA:  Chest pain  EXAM: CHEST  2 VIEW  COMPARISON:  10/08/2007  FINDINGS: Cardiac shadow is at the upper limits of normal in size. The lungs are clear bilaterally. No focal infiltrate or sizable effusion is seen.  IMPRESSION: No active cardiopulmonary disease.   Electronically Signed   By: Inez Catalina M.D.   On: 11/03/2013 12:10    Review of Systems  Constitutional: Negative for fever and diaphoresis.  HENT: Negative for congestion and sore throat.   Respiratory: Negative for cough and shortness of breath.   Cardiovascular: Positive for chest pain. Negative for orthopnea, claudication, leg swelling and PND.  Gastrointestinal: Positive for nausea, vomiting and blood in stool. Negative for abdominal pain and melena.       +hematemesis  Genitourinary: Negative for hematuria.  Musculoskeletal: Negative for myalgias.  Neurological: Negative for dizziness.  All other systems  reviewed and are negative.  Blood pressure 138/114, pulse 65, resp. rate 21, SpO2 99.00%. Physical Exam  Nursing note and vitals reviewed. Constitutional: He is oriented to person, place, and time. He appears well-developed and well-nourished. No distress.  HENT:  Head: Normocephalic and atraumatic.  Mouth/Throat: No oropharyngeal exudate.  Eyes: EOM are normal. Pupils are equal, round, and reactive to light. No scleral icterus.  Neck: Normal range of motion. Neck supple.  Cardiovascular: Normal rate, S1  normal and S2 normal.   No murmur heard. Pulses:      Radial pulses are 2+ on the right side, and 2+ on the left side.       Dorsalis pedis pulses are 2+ on the right side, and 2+ on the left side.  Respiratory: Effort normal and breath sounds normal. He has no wheezes. He has no rales.  GI: Soft. Bowel sounds are normal. He exhibits no distension. There is no tenderness.  Musculoskeletal: He exhibits no edema.  Lymphadenopathy:    He has no cervical adenopathy.  Neurological: He is alert and oriented to person, place, and time. He exhibits normal muscle tone.  Skin: Skin is warm and dry.  Psychiatric: He has a normal mood and affect.    Assessment/Plan: Principal Problem:   Chest pain Active Problems:   Tobacco abuse  Plan: Sounds noncardiac and likely GI. Nonexertional.  Two negative troponins.  EKG shows TWI inferiorly and in V3-5 probably from LV strain.  Discussed tobacco cessation.  Recommend he stops drinking and add PPI.  Stop NSAIDS   HAGER, BRYAN, PA-C 11/03/2013, 1:37 PM      Agree with note written by Luisa Dago PAC  Young BM with minimal CRF and 1 month H/O atypical epigastric pain. Currently pain free. Exam benign. Enz neg. EKG notable with LVH with strain. Suspect pain is not ischemic but rather gastrointestinal ( PUD, gastritis, esophagitis). LFTs not elevated. Can be D/Cd on PPI.  No cardiology F/U necessary. Needs to stop smoking and drinking.  Lorretta Harp 11/03/2013 1:58 PM

## 2013-11-03 NOTE — ED Notes (Signed)
PA at bedside.

## 2013-11-03 NOTE — ED Notes (Signed)
Cardiology at the bedside.

## 2013-11-09 NOTE — Discharge Planning (Signed)
[facility] Liaison was not able to see patient, [facility] orange card information and primary care resource guide will be mailed to the address listed

## 2016-09-28 ENCOUNTER — Emergency Department (HOSPITAL_COMMUNITY)
Admission: EM | Admit: 2016-09-28 | Discharge: 2016-09-28 | Disposition: A | Discharge status: Home / Self Care | Payer: Self-pay | Attending: Dermatology | Admitting: Dermatology

## 2016-09-28 DIAGNOSIS — F419 Anxiety disorder, unspecified: Secondary | ICD-10-CM | POA: Insufficient documentation

## 2016-09-28 DIAGNOSIS — Z5321 Procedure and treatment not carried out due to patient leaving prior to being seen by health care provider: Secondary | ICD-10-CM | POA: Insufficient documentation

## 2016-09-28 NOTE — ED Notes (Signed)
Pt now stating "I just want to go.  I started feeling better in the ambulance."  Charge RN informed pt wanting to leave.

## 2016-09-28 NOTE — ED Notes (Signed)
Bed: WA32 Expected date:  Expected time:  Means of arrival:  Comments: 

## 2016-09-28 NOTE — ED Triage Notes (Signed)
Per [facility], around 1400 pt used Molly and began feeling anxious.

## 2017-11-18 ENCOUNTER — Ambulatory Visit (HOSPITAL_COMMUNITY)
Admission: EM | Admit: 2017-11-18 | Discharge: 2017-11-18 | Disposition: A | Discharge status: Home / Self Care | Payer: Self-pay | Attending: Family Medicine | Admitting: Family Medicine

## 2017-11-18 ENCOUNTER — Encounter (HOSPITAL_COMMUNITY): Payer: Self-pay | Admitting: *Deleted

## 2017-11-18 ENCOUNTER — Other Ambulatory Visit: Payer: Self-pay

## 2017-11-18 DIAGNOSIS — R1084 Generalized abdominal pain: Secondary | ICD-10-CM

## 2017-11-18 DIAGNOSIS — R112 Nausea with vomiting, unspecified: Secondary | ICD-10-CM

## 2017-11-18 DIAGNOSIS — R197 Diarrhea, unspecified: Secondary | ICD-10-CM

## 2017-11-18 MED ORDER — ONDANSETRON HCL 4 MG PO TABS: 4.0000 mg | ORAL_TABLET | Freq: Three times a day (TID) | ORAL | 0 refills | Status: DC | PRN

## 2017-11-18 NOTE — Discharge Instructions (Addendum)
It was nice meeting you!!  I believe that you have either a stomach virus or food poisoning.  Your exam was normal.  I will prescribe zofran for the nausea.  Please return or go to the ER for worsening symptoms.

## 2017-11-18 NOTE — ED Provider Notes (Signed)
[place]    CSN: [id number] Arrival date & time: 11/18/17  7902     History   Chief Complaint Chief Complaint  Patient presents with  . Abdominal Pain    HPI Robert [name] is a 37 y.o. male.   Pt is a 37 year old male with complaints of generalized abd cramping. This started yesterday morning. He admits to eating some chinese food that could have been bad Sunday night. The abd pain is associated with N,V,D. This has improved since yesterday. He is feeling better. He hasn't taken any medications for his symptoms. He denies fever, chills, recent travels, dysuria, or constipation. He does have a hx of smoking and alcohol use. No significant medical hx.   ROS per HPI      Past Medical History:  Diagnosis Date  . Asthma     Patient Active Problem List   Diagnosis Date Noted  . Chest pain 11/03/2013  . Tobacco abuse 11/03/2013    Past Surgical History:  Procedure Laterality Date  . WRIST SURGERY         Home Medications    Prior to Admission medications   Medication Sig Start Date End Date Taking? Authorizing Provider  ibuprofen (ADVIL,MOTRIN) 200 MG tablet Take 400 mg by mouth 2 (two) times daily as needed (pain).    [provider]  omeprazole (PRILOSEC) 20 MG capsule Take 1 capsule (20 mg total) by mouth daily. 11/03/13   Muthersbaugh, Jarrett Soho, PA-C  ondansetron (ZOFRAN) 4 MG tablet Take 1 tablet (4 mg total) by mouth every 8 (eight) hours as needed for nausea or vomiting. 11/18/17   Orvan July, NP    Family History Family History  Problem Relation Age of Onset  . Heart failure Father     Social History Social History   Tobacco Use  . Smoking status: Current Every Day Smoker    Packs/day: 1.00    Types: Cigarettes  . Smokeless tobacco: Never Used  Substance Use Topics  . Alcohol use: Yes    Comment: every other day.  2-3, 20 ounce beers   . Drug use: Yes    Types: Marijuana     Allergies   Patient has no known  allergies.   Review of Systems Review of Systems   Physical Exam Triage Vital Signs ED Triage Vitals  Enc Vitals Group     BP 11/18/17 1000 (!) 140/92     Pulse Rate 11/18/17 1000 80     Resp 11/18/17 1000 18     Temp 11/18/17 1000 98.3 F (36.8 C)     Temp Source 11/18/17 1000 Oral     SpO2 11/18/17 1000 98 %     Weight --      Height --      Head Circumference --      Peak Flow --      Pain Score 11/18/17 1001 4     Pain Loc --      Pain Edu? --      Excl. in [place]? --    No data found.  Updated Vital Signs BP (!) 140/92 (BP Location: Right Arm)   Pulse 80   Temp 98.3 F (36.8 C) (Oral)   Resp 18   SpO2 98%   Visual Acuity Right Eye Distance:   Left Eye Distance:   Bilateral Distance:    Right Eye Near:   Left Eye Near:    Bilateral Near:     Physical Exam  Constitutional: He appears well-developed and well-nourished. He does not appear ill.  HENT:  Head: Normocephalic.  Cardiovascular: Normal rate and regular rhythm.  Pulmonary/Chest: Effort normal.  Abdominal: Soft. Normal appearance and bowel sounds are normal. He exhibits no shifting dullness, no distension, no pulsatile liver, no fluid wave, no abdominal bruit, no ascites, no pulsatile midline mass and no mass. There is no hepatosplenomegaly, splenomegaly or hepatomegaly. There is no tenderness. There is no rigidity, no rebound, no guarding, no tenderness at McBurney's point and negative Murphy's sign.  Skin: Skin is dry. Capillary refill takes less than 2 seconds.     UC Treatments / Results  Labs (all labs ordered are listed, but only abnormal results are displayed) Labs Reviewed - No data to display  EKG None  Radiology No results found.  Procedures Procedures (including critical care time)  Medications Ordered in UC Medications - No data to display  Initial Impression / Assessment and Plan / UC Course  I have reviewed the triage vital signs and the nursing notes.  Pertinent labs &  imaging results that were available during my care of the patient were reviewed by me and considered in my medical decision making (see chart for details).     I believe this is a self limiting stomach virus or food poisoning. More likely food poisoning. No acute abdomen. Exam completley normal. Zofran for nausea and work note given. Return precautions given.  Final Clinical Impressions(s) / UC Diagnoses   Final diagnoses:  Generalized abdominal pain  Nausea vomiting and diarrhea     Discharge Instructions     It was nice meeting you!!  I believe that you have either a stomach virus or food poisoning.  Your exam was normal.  I will prescribe zofran for the nausea.  Please return or go to the ER for worsening symptoms.    ED Prescriptions    Medication Sig Dispense Auth. Provider   ondansetron (ZOFRAN) 4 MG tablet  (Status: Discontinued) Take 1 tablet (4 mg total) by mouth every 8 (eight) hours as needed for nausea or vomiting. 4 tablet Alissa Pharr A, NP   ondansetron (ZOFRAN) 4 MG tablet Take 1 tablet (4 mg total) by mouth every 8 (eight) hours as needed for nausea or vomiting. 4 tablet Loura Halt A, NP     Controlled Substance Prescriptions [place] Controlled Substance Registry consulted? Not Applicable   Orvan July, NP 11/18/17 1133

## 2017-11-18 NOTE — ED Triage Notes (Signed)
C/o abd. Pain osnet yest. vomited x 1has diarrhea yest

## 2017-12-04 ENCOUNTER — Ambulatory Visit (HOSPITAL_COMMUNITY)
Admission: EM | Admit: 2017-12-04 | Discharge: 2017-12-04 | Disposition: A | Discharge status: Home / Self Care | Payer: Self-pay | Attending: Family Medicine | Admitting: Family Medicine

## 2017-12-04 ENCOUNTER — Encounter (HOSPITAL_COMMUNITY): Payer: Self-pay | Admitting: Emergency Medicine

## 2017-12-04 DIAGNOSIS — R1084 Generalized abdominal pain: Secondary | ICD-10-CM | POA: Insufficient documentation

## 2017-12-04 DIAGNOSIS — F17210 Nicotine dependence, cigarettes, uncomplicated: Secondary | ICD-10-CM | POA: Insufficient documentation

## 2017-12-04 DIAGNOSIS — J45909 Unspecified asthma, uncomplicated: Secondary | ICD-10-CM | POA: Insufficient documentation

## 2017-12-04 DIAGNOSIS — R197 Diarrhea, unspecified: Secondary | ICD-10-CM | POA: Insufficient documentation

## 2017-12-04 DIAGNOSIS — R1010 Upper abdominal pain, unspecified: Secondary | ICD-10-CM

## 2017-12-04 DIAGNOSIS — Z79899 Other long term (current) drug therapy: Secondary | ICD-10-CM | POA: Insufficient documentation

## 2017-12-04 LAB — COMPREHENSIVE METABOLIC PANEL
ALT: 22 U/L (ref 0–44)
AST: 22 U/L (ref 15–41)
Albumin: 3.5 g/dL (ref 3.5–5.0)
Alkaline Phosphatase: 46 U/L (ref 38–126)
Anion gap: 9 (ref 5–15)
BUN: 5 mg/dL — ABNORMAL LOW (ref 6–20)
CO2: 23 mmol/L (ref 22–32)
Calcium: 8.5 mg/dL — ABNORMAL LOW (ref 8.9–10.3)
Chloride: 108 mmol/L (ref 98–111)
Creatinine, Ser: 1.09 mg/dL (ref 0.61–1.24)
GFR calc Af Amer: >60 mL/min (ref >60)
GFR calc non Af Amer: >60 mL/min (ref >60)
Glucose, Bld: 92 mg/dL (ref 70–99)
Potassium: 4.2 mmol/L (ref 3.5–5.1)
Sodium: 140 mmol/L (ref 135–145)
Total Bilirubin: 0.5 mg/dL (ref 0.3–1.2)
Total Protein: 5.6 g/dL — ABNORMAL LOW (ref 6.5–8.1)

## 2017-12-04 LAB — LIPASE, BLOOD: Lipase: 26 U/L (ref 11–51)

## 2017-12-04 LAB — AMYLASE: Amylase: 55 U/L (ref 28–100)

## 2017-12-04 NOTE — Discharge Instructions (Addendum)
It was nice meeting you!!  This could be a simple virus but we will check a few labs to ensure that everything else is okay.  We will call you with the results.  Make sure you drink plenty of fluids to stay hydrated with the diarrhea. I will stay away from spicy, greasy foods or milk products until the diarrhea resolves. If you develop any worsening abdominal pain, nausea, vomiting please return to be reevaluated.

## 2017-12-04 NOTE — ED Triage Notes (Signed)
PT reports generalized abdominal pain with diarrhea that started yesterday.

## 2017-12-04 NOTE — ED Provider Notes (Signed)
[place]    CSN: [id number] Arrival date & time: 12/04/17  1229     History   Chief Complaint Chief Complaint  Patient presents with  . Abdominal Pain    HPI Robert [name] is a 37 y.o. male.   Patient is a 37 year old male presents with 1 day of generalized abdominal discomfort with diarrhea.  He had 3 episodes of diarrhea last night. This started after he ate some nachos from [employer] and drank a beer He describes his stool as more soft than normal but not watery explosive. He denies any blood mixed with the diarrhea.  He is feeling better today. He has had no diarrhea today. He denies any N,V constipation. He denies any fever or recent travels. He denies any back pain, dysuria, or penile discharge. He denies any recent sick contacts.   He was here 3 weeks ago for similar symptom. He reports he had consumed alcohol prior to this episode also. He does have a history of alcohol abuse and hasn't had any labs drawn in many years.   ROS per HPI      Past Medical History:  Diagnosis Date  . Asthma     Patient Active Problem List   Diagnosis Date Noted  . Chest pain 11/03/2013  . Tobacco abuse 11/03/2013    Past Surgical History:  Procedure Laterality Date  . WRIST SURGERY         Home Medications    Prior to Admission medications   Medication Sig Start Date End Date Taking? Authorizing Provider  ibuprofen (ADVIL,MOTRIN) 200 MG tablet Take 400 mg by mouth 2 (two) times daily as needed (pain).    [provider]  omeprazole (PRILOSEC) 20 MG capsule Take 1 capsule (20 mg total) by mouth daily. 11/03/13   Muthersbaugh, Jarrett Soho, PA-C  ondansetron (ZOFRAN) 4 MG tablet Take 1 tablet (4 mg total) by mouth every 8 (eight) hours as needed for nausea or vomiting. 11/18/17   Orvan July, NP    Family History Family History  Problem Relation Age of Onset  . Heart failure Father     Social History Social History   Tobacco Use  . Smoking status:  Current Every Day Smoker    Packs/day: 1.00    Types: Cigarettes  . Smokeless tobacco: Never Used  Substance Use Topics  . Alcohol use: Yes    Comment: every other day.  2-3, 20 ounce beers   . Drug use: Yes    Types: Marijuana     Allergies   Patient has no known allergies.   Review of Systems Review of Systems   Physical Exam Triage Vital Signs ED Triage Vitals  Enc Vitals Group     BP 12/04/17 1306 114/63     Pulse Rate 12/04/17 1306 (!) 101     Resp 12/04/17 1306 16     Temp 12/04/17 1306 98.9 F (37.2 C)     Temp Source 12/04/17 1306 Oral     SpO2 12/04/17 1306 98 %     Weight 12/04/17 1307 184 lb (83.5 kg)     Height --      Head Circumference --      Peak Flow --      Pain Score 12/04/17 1307 5     Pain Loc --      Pain Edu? --      Excl. in [place]? --    No data found.  Updated Vital Signs BP 114/63  Pulse (!) 101   Temp 98.9 F (37.2 C) (Oral)   Resp 16   Wt 184 lb (83.5 kg)   SpO2 98%   Visual Acuity Right Eye Distance:   Left Eye Distance:   Bilateral Distance:    Right Eye Near:   Left Eye Near:    Bilateral Near:     Physical Exam  Constitutional: He appears well-developed and well-nourished.  HENT:  Head: Normocephalic and atraumatic.  Pulmonary/Chest: Effort normal.  Abdominal: Soft. Normal appearance and bowel sounds are normal. He exhibits no shifting dullness, no distension, no pulsatile liver, no fluid wave, no abdominal bruit, no ascites, no pulsatile midline mass and no mass. There is no hepatosplenomegaly, splenomegaly or hepatomegaly. There is no tenderness. There is no rigidity, no rebound, no guarding, no CVA tenderness, no tenderness at McBurney's point and negative Murphy's sign. No hernia.  Nontender upon deep and light palpation of the entire abdomen.   Neurological: He is alert.  Skin: Skin is warm and dry. Capillary refill takes less than 2 seconds.  Psychiatric: He has a normal mood and affect.  Nursing note and vitals  reviewed.    UC Treatments / Results  Labs (all labs ordered are listed, but only abnormal results are displayed) Labs Reviewed  COMPREHENSIVE METABOLIC PANEL - Abnormal; Notable for the following components:      Result Value   BUN 5 (*)    Calcium 8.5 (*)    Total Protein 5.6 (*)    All other components within normal limits  LIPASE, BLOOD  AMYLASE    EKG None  Radiology No results found.  Procedures Procedures (including critical care time)  Medications Ordered in UC Medications - No data to display  Initial Impression / Assessment and Plan / UC Course  I have reviewed the triage vital signs and the nursing notes.  Pertinent labs & imaging results that were available during my care of the patient were reviewed by me and considered in my medical decision making (see chart for details).    Pt abdominal exam not concerning. No tenderness to deep or light palpation. Based on history and the fact that labs haven't been drawn in years I will Draw some basic labs and check liver function and pancreatic function. Most likely this is another episode of bad food or stomach virus but based on his history we will check labs. Work note given. Will call with any positive results. Return precautions given. Pt agreeable to plan.   Patient lab work completed and normal. Patient made aware.  Final Clinical Impressions(s) / UC Diagnoses   Final diagnoses:  Pain of upper abdomen     Discharge Instructions     It was nice meeting you!!  This could be a simple virus but we will check a few labs to ensure that everything else is okay.  We will call you with the results.  Make sure you drink plenty of fluids to stay hydrated with the diarrhea. I will stay away from spicy, greasy foods or milk products until the diarrhea resolves. If you develop any worsening abdominal pain, nausea, vomiting please return to be reevaluated.    ED Prescriptions    None     Controlled Substance  Prescriptions [place] Controlled Substance Registry consulted? Not Applicable   Orvan July, NP 12/05/17 1129

## 2018-03-01 ENCOUNTER — Emergency Department (HOSPITAL_COMMUNITY)
Admission: EM | Admit: 2018-03-01 | Discharge: 2018-03-02 | Disposition: A | Discharge status: Home / Self Care | Payer: Self-pay | Attending: Emergency Medicine | Admitting: Emergency Medicine

## 2018-03-01 DIAGNOSIS — K219 Gastro-esophageal reflux disease without esophagitis: Secondary | ICD-10-CM | POA: Insufficient documentation

## 2018-03-01 DIAGNOSIS — J4520 Mild intermittent asthma, uncomplicated: Secondary | ICD-10-CM

## 2018-03-01 DIAGNOSIS — F17210 Nicotine dependence, cigarettes, uncomplicated: Secondary | ICD-10-CM | POA: Insufficient documentation

## 2018-03-01 DIAGNOSIS — R0789 Other chest pain: Secondary | ICD-10-CM

## 2018-03-01 DIAGNOSIS — J45909 Unspecified asthma, uncomplicated: Secondary | ICD-10-CM | POA: Insufficient documentation

## 2018-03-01 DIAGNOSIS — F1410 Cocaine abuse, uncomplicated: Secondary | ICD-10-CM | POA: Insufficient documentation

## 2018-03-01 NOTE — ED Triage Notes (Signed)
Pt c/o central chest pain for the last few days; pt states the pain stays in one area; pt states he has had some dizziness; pt states the pain is intermittent

## 2018-03-02 ENCOUNTER — Encounter (HOSPITAL_COMMUNITY): Payer: Self-pay | Admitting: *Deleted

## 2018-03-02 ENCOUNTER — Other Ambulatory Visit: Payer: Self-pay

## 2018-03-02 ENCOUNTER — Emergency Department (HOSPITAL_COMMUNITY): Payer: Self-pay

## 2018-03-02 LAB — COMPREHENSIVE METABOLIC PANEL
ALT: 37 U/L (ref 0–44)
AST: 28 U/L (ref 15–41)
Albumin: 4.1 g/dL (ref 3.5–5.0)
Alkaline Phosphatase: 50 U/L (ref 38–126)
Anion gap: 8 (ref 5–15)
BUN: 14 mg/dL (ref 6–20)
CO2: 25 mmol/L (ref 22–32)
Calcium: 8.8 mg/dL — ABNORMAL LOW (ref 8.9–10.3)
Chloride: 107 mmol/L (ref 98–111)
Creatinine, Ser: 1.03 mg/dL (ref 0.61–1.24)
GFR calc Af Amer: >60 mL/min (ref >60)
GFR calc non Af Amer: >60 mL/min (ref >60)
Glucose, Bld: 96 mg/dL (ref 70–99)
Potassium: 4.4 mmol/L (ref 3.5–5.1)
Sodium: 140 mmol/L (ref 135–145)
Total Bilirubin: 0.7 mg/dL (ref 0.3–1.2)
Total Protein: 6.8 g/dL (ref 6.5–8.1)

## 2018-03-02 LAB — CBC WITH DIFFERENTIAL/PLATELET
Abs Immature Granulocytes: 0.02 10*3/uL (ref 0.00–0.07)
Basophils Absolute: 0 10*3/uL (ref 0.0–0.1)
Basophils Relative: 1 %
Eosinophils Absolute: 0.2 10*3/uL (ref 0.0–0.5)
Eosinophils Relative: 3 %
HCT: 43.4 % (ref 39.0–52.0)
Hemoglobin: 14.1 g/dL (ref 13.0–17.0)
Immature Granulocytes: 0 %
Lymphocytes Relative: 32 %
Lymphs Abs: 1.8 10*3/uL (ref 0.7–4.0)
MCH: 28.7 pg (ref 26.0–34.0)
MCHC: 32.5 g/dL (ref 30.0–36.0)
MCV: 88.2 fL (ref 80.0–100.0)
Monocytes Absolute: 0.5 10*3/uL (ref 0.1–1.0)
Monocytes Relative: 9 %
Neutro Abs: 3.2 10*3/uL (ref 1.7–7.7)
Neutrophils Relative %: 55 %
Platelets: 308 10*3/uL (ref 150–400)
RBC: 4.92 MIL/uL (ref 4.22–5.81)
RDW: 13.2 % (ref 11.5–15.5)
WBC: 5.7 10*3/uL (ref 4.0–10.5)
nRBC: 0 % (ref 0.0–0.2)

## 2018-03-02 LAB — TROPONIN I: Troponin I: <0.03 ng/mL (ref <0.03)

## 2018-03-02 LAB — LIPASE, BLOOD: Lipase: 25 U/L (ref 11–51)

## 2018-03-02 MED ORDER — ALBUTEROL SULFATE (2.5 MG/3ML) 0.083% IN NEBU
5.0000 mg | INHALATION_SOLUTION | Freq: Once | RESPIRATORY_TRACT | Status: AC
  Administered 2018-03-02: 5 mg via RESPIRATORY_TRACT
  Filled 2018-03-02: qty 6

## 2018-03-02 MED ORDER — OMEPRAZOLE 20 MG PO CPDR: DELAYED_RELEASE_CAPSULE | ORAL | 0 refills | Status: DC

## 2018-03-02 MED ORDER — FAMOTIDINE 20 MG PO TABS
20.0000 mg | ORAL_TABLET | Freq: Once | ORAL | Status: AC
  Administered 2018-03-02: 20 mg via ORAL
  Filled 2018-03-02: qty 1

## 2018-03-02 MED ORDER — AEROCHAMBER Z-STAT PLUS/MEDIUM MISC: 1.0000 | Freq: Once | Status: DC

## 2018-03-02 MED ORDER — ALBUTEROL SULFATE HFA 108 (90 BASE) MCG/ACT IN AERS
2.0000 | INHALATION_SPRAY | Freq: Four times a day (QID) | RESPIRATORY_TRACT | Status: DC | PRN
  Administered 2018-03-02: 2 via RESPIRATORY_TRACT
  Filled 2018-03-02: qty 6.7

## 2018-03-02 MED ORDER — GI COCKTAIL ~~LOC~~
30.0000 mL | Freq: Once | ORAL | Status: AC
  Administered 2018-03-02: 30 mL via ORAL
  Filled 2018-03-02: qty 30

## 2018-03-02 NOTE — ED Notes (Signed)
RT notified of need for aerochamber and albuterol MDI

## 2018-03-02 NOTE — ED Provider Notes (Addendum)
[facility] EMERGENCY DEPARTMENT Provider Note   CSN: [id number] Arrival date & time: 03/01/18  2353  Time seen 12:35 AM   History   Chief Complaint Chief Complaint  Patient presents with  . Chest Pain    HPI Robert [name] is a 37 y.o. male.  HPI patient states he was doing cocaine October 17 and has been having trouble sleeping since then.  He states the following day, the 18th he started having some chest pain during the day.  He states it is in his lower central chest and it is described as a burning and dull pain that comes and goes.  When it comes on it lasts for few hours.  He has had some mild shortness of breath earlier today.  He denies nausea, vomiting, coughing, diaphoresis.  His wife states he has been coughing but he denies it.  He states he is having chills without documented fevers.  He states that he sleeps 1 hour and then he wakes up.  He has noticed that she has makes the pain worse, and he drinks a few shots of alcohol which briefly helps the pain but then it returns.  He denies getting any acid fluid in his throat.  He denies any family history of heart disease.  PCP Patient, No Pcp Per   Past Medical History:  Diagnosis Date  . Asthma     Patient Active Problem List   Diagnosis Date Noted  . Chest pain 11/03/2013  . Tobacco abuse 11/03/2013    Past Surgical History:  Procedure Laterality Date  . WRIST SURGERY          Home Medications    Prior to Admission medications   Medication Sig Start Date End Date Taking? Authorizing Provider  ibuprofen (ADVIL,MOTRIN) 200 MG tablet Take 400 mg by mouth 2 (two) times daily as needed (pain).    [provider]  omeprazole (PRILOSEC) 20 MG capsule Take 1 po BID x 2 weeks then once a day 03/02/18   Rolland Porter, MD  ondansetron (ZOFRAN) 4 MG tablet Take 1 tablet (4 mg total) by mouth every 8 (eight) hours as needed for nausea or vomiting. 11/18/17   Orvan July, NP    Family History Family History    Problem Relation Age of Onset  . Heart failure Father     Social History Social History   Tobacco Use  . Smoking status: Current Every Day Smoker    Packs/day: 1.00    Types: Cigarettes  . Smokeless tobacco: Never Used  Substance Use Topics  . Alcohol use: Yes    Comment: every other day.  2-3, 20 ounce beers   . Drug use: Yes    Types: Marijuana  states he does cocaine monthly He drinks a few shots every other day   Allergies   Patient has no known allergies.   Review of Systems Review of Systems  All other systems reviewed and are negative.    Physical Exam Updated Vital Signs BP (!) 138/91   Pulse 69   Temp 99.1 F (37.3 C) (Oral)   Resp 17   Ht 5\' 10"  (1.778 m)   Wt 86.2 kg   SpO2 100%   BMI 27.26 kg/m   Vital signs normal   Physical Exam  Constitutional: He is oriented to person, place, and time. He appears well-developed and well-nourished.  Non-toxic appearance. He does not appear ill. No distress.  HENT:  Head: Normocephalic and atraumatic.  Right Ear:  External ear normal.  Left Ear: External ear normal.  Nose: Nose normal. No mucosal edema or rhinorrhea.  Mouth/Throat: Oropharynx is clear and moist and mucous membranes are normal. No dental abscesses or uvula swelling.  Eyes: Pupils are equal, round, and reactive to light. Conjunctivae and EOM are normal.  Neck: Normal range of motion and full passive range of motion without pain. Neck supple.  Cardiovascular: Normal rate, regular rhythm and normal heart sounds. Exam reveals no gallop and no friction rub.  No murmur heard. Pulmonary/Chest: Effort normal. No respiratory distress. He has wheezes. He has no rhonchi. He has no rales. He exhibits no tenderness and no crepitus.  Area of chest pain noted, patient has a few scattered wheezes especially at the bases.  He states he used to have reactive airway disease as a child and denies having a current inhaler.    Abdominal: Soft. Normal appearance  and bowel sounds are normal. He exhibits no distension. There is no tenderness. There is no rebound and no guarding.  Musculoskeletal: Normal range of motion. He exhibits no edema or tenderness.  Moves all extremities well.   Neurological: He is alert and oriented to person, place, and time. He has normal strength. No cranial nerve deficit.  Skin: Skin is warm, dry and intact. No rash noted. No erythema. No pallor.  Psychiatric: He has a normal mood and affect. His speech is normal and behavior is normal. His mood appears not anxious.  Nursing note and vitals reviewed.    ED Treatments / Results  Labs (all labs ordered are listed, but only abnormal results are displayed)  Results for orders placed or performed during the hospital encounter of 03/01/18  Comprehensive metabolic panel  Result Value Ref Range   Sodium 140 135 - 145 mmol/L   Potassium 4.4 3.5 - 5.1 mmol/L   Chloride 107 98 - 111 mmol/L   CO2 25 22 - 32 mmol/L   Glucose, Bld 96 70 - 99 mg/dL   BUN 14 6 - 20 mg/dL   Creatinine, Ser 1.03 0.61 - 1.24 mg/dL   Calcium 8.8 (L) 8.9 - 10.3 mg/dL   Total Protein 6.8 6.5 - 8.1 g/dL   Albumin 4.1 3.5 - 5.0 g/dL   AST 28 15 - 41 U/L   ALT 37 0 - 44 U/L   Alkaline Phosphatase 50 38 - 126 U/L   Total Bilirubin 0.7 0.3 - 1.2 mg/dL   GFR calc non Af Amer >60 >60 mL/min   GFR calc Af Amer >60 >60 mL/min   Anion gap 8 5 - 15  CBC with Differential  Result Value Ref Range   WBC 5.7 4.0 - 10.5 K/uL   RBC 4.92 4.22 - 5.81 MIL/uL   Hemoglobin 14.1 13.0 - 17.0 g/dL   HCT 43.4 39.0 - 52.0 %   MCV 88.2 80.0 - 100.0 fL   MCH 28.7 26.0 - 34.0 pg   MCHC 32.5 30.0 - 36.0 g/dL   RDW 13.2 11.5 - 15.5 %   Platelets 308 150 - 400 K/uL   nRBC 0.0 0.0 - 0.2 %   Neutrophils Relative % 55 %   Neutro Abs 3.2 1.7 - 7.7 K/uL   Lymphocytes Relative 32 %   Lymphs Abs 1.8 0.7 - 4.0 K/uL   Monocytes Relative 9 %   Monocytes Absolute 0.5 0.1 - 1.0 K/uL   Eosinophils Relative 3 %   Eosinophils  Absolute 0.2 0.0 - 0.5 K/uL   Basophils Relative 1 %  Basophils Absolute 0.0 0.0 - 0.1 K/uL   Immature Granulocytes 0 %   Abs Immature Granulocytes 0.02 0.00 - 0.07 K/uL  Troponin I  Result Value Ref Range   Troponin I <0.03 <0.03 ng/mL  Lipase, blood  Result Value Ref Range   Lipase 25 11 - 51 U/L    Laboratory interpretation all normal   EKG EKG Interpretation  Date/Time:  Monday March 02 2018 00:03:23 EDT Ventricular Rate:  78 PR Interval:    QRS Duration: 91 QT Interval:  391 QTC Calculation: 446 R Axis:   51 Text Interpretation:  Sinus rhythm Abnrm T, probable ischemia, anterolateral lds Baseline wander in lead(s) V1 No significant change since last tracing 03 Nov 2017 Confirmed by Rolland Porter [phone]) on 03/02/2018 12:09:23 AM   Radiology Dg Chest 2 View  Result Date: 03/02/2018 CLINICAL DATA:  Chest pain, shortness of Breath EXAM: CHEST - 2 VIEW COMPARISON:  11/03/2013 FINDINGS: Heart and mediastinal contours are within normal limits. No focal opacities or effusions. No acute bony abnormality. IMPRESSION: No active cardiopulmonary disease. Electronically Signed   By: Rolm Baptise M.D.   On: 03/02/2018 01:11    Procedures Procedures (including critical care time)  Medications Ordered in ED Medications  albuterol (PROVENTIL HFA;VENTOLIN HFA) 108 (90 Base) MCG/ACT inhaler 2 puff (has no administration in time range)  aerochamber Z-Stat Plus/medium 1 each (has no administration in time range)  gi cocktail (Maalox,Lidocaine,Donnatal) (30 mLs Oral Given 03/02/18 0116)  famotidine (PEPCID) tablet 20 mg (20 mg Oral Given 03/02/18 0116)  albuterol (PROVENTIL) (2.5 MG/3ML) 0.083% nebulizer solution 5 mg (5 mg Nebulization Given 03/02/18 0111)     Initial Impression / Assessment and Plan / ED Course  I have reviewed the triage vital signs and the nursing notes.  Pertinent labs & imaging results that were available during my care of the patient were reviewed by me and  considered in my medical decision making (see chart for details).    Laboratory testing was done to look for myocardial event, also lipase was done to look for pancreatitis.  He has had wheezing and his wife states he has been coughing although he denies it. He also has had some shortness of breath.  Chest x-ray was done to look for pneumonia or other acute intrapulmonary problem.  An albuterol nebulizer was done to see if that would help with his wheezing.  Patient describes symptoms that sound like it may be heartburn or GERD related.  He was given a GI cocktail and Pepcid.  Recheck at 2:15 AM patient is sleeping soundly.  He states he feels much better.  We went over his test results which were all normal.  He was given an albuterol inhaler and spacer to use at home and started on Prilosec for his GERD symptoms.  We did also discussed he needs to stay away from the cocaine and he is in agreement.  Final Clinical Impressions(s) / ED Diagnoses   Final diagnoses:  Atypical chest pain  Gastroesophageal reflux disease without esophagitis  Mild intermittent asthma, unspecified whether complicated  Cocaine abuse [facility])    ED Discharge Orders         Ordered    omeprazole (PRILOSEC) 20 MG capsule     03/02/18 0218         Plan discharge  Rolland Porter, MD, Barbette Or, MD 03/02/18 Natasha Mead    Rolland Porter, MD 03/02/18 0221

## 2018-03-02 NOTE — Discharge Instructions (Signed)
Look at the information about GERD or heartburn and try to avoid the foods that make it worse.  Use the inhaler as needed for wheezing.  Stay away from the cocaine.  Try melatonin over-the-counter 5 mg tablets for sleep.  Recheck as needed.

## 2018-03-31 ENCOUNTER — Emergency Department (HOSPITAL_COMMUNITY)
Admission: EM | Admit: 2018-03-31 | Discharge: 2018-03-31 | Disposition: A | Discharge status: Home / Self Care | Payer: Self-pay | Attending: Emergency Medicine | Admitting: Emergency Medicine

## 2018-03-31 ENCOUNTER — Emergency Department (HOSPITAL_COMMUNITY): Payer: Self-pay

## 2018-03-31 ENCOUNTER — Other Ambulatory Visit: Payer: Self-pay

## 2018-03-31 ENCOUNTER — Encounter (HOSPITAL_COMMUNITY): Payer: Self-pay | Admitting: Internal Medicine

## 2018-03-31 DIAGNOSIS — R0789 Other chest pain: Secondary | ICD-10-CM | POA: Insufficient documentation

## 2018-03-31 DIAGNOSIS — F17210 Nicotine dependence, cigarettes, uncomplicated: Secondary | ICD-10-CM | POA: Insufficient documentation

## 2018-03-31 DIAGNOSIS — J45909 Unspecified asthma, uncomplicated: Secondary | ICD-10-CM | POA: Insufficient documentation

## 2018-03-31 LAB — COMPREHENSIVE METABOLIC PANEL
ALT: 46 U/L — ABNORMAL HIGH (ref 0–44)
AST: 39 U/L (ref 15–41)
Albumin: 4.4 g/dL (ref 3.5–5.0)
Alkaline Phosphatase: 53 U/L (ref 38–126)
Anion gap: 12 (ref 5–15)
BUN: 6 mg/dL (ref 6–20)
CO2: 22 mmol/L (ref 22–32)
Calcium: 9.1 mg/dL (ref 8.9–10.3)
Chloride: 104 mmol/L (ref 98–111)
Creatinine, Ser: 1.07 mg/dL (ref 0.61–1.24)
GFR calc Af Amer: >60 mL/min (ref >60)
GFR calc non Af Amer: >60 mL/min (ref >60)
Glucose, Bld: 114 mg/dL — ABNORMAL HIGH (ref 70–99)
Potassium: 3.6 mmol/L (ref 3.5–5.1)
Sodium: 138 mmol/L (ref 135–145)
Total Bilirubin: 0.9 mg/dL (ref 0.3–1.2)
Total Protein: 6.9 g/dL (ref 6.5–8.1)

## 2018-03-31 LAB — CBC WITH DIFFERENTIAL/PLATELET
Abs Immature Granulocytes: 0.01 10*3/uL (ref 0.00–0.07)
Basophils Absolute: 0.1 10*3/uL (ref 0.0–0.1)
Basophils Relative: 1 %
Eosinophils Absolute: 0.1 10*3/uL (ref 0.0–0.5)
Eosinophils Relative: 2 %
HCT: 43.4 % (ref 39.0–52.0)
Hemoglobin: 14.5 g/dL (ref 13.0–17.0)
Immature Granulocytes: 0 %
Lymphocytes Relative: 32 %
Lymphs Abs: 1.9 10*3/uL (ref 0.7–4.0)
MCH: 28.8 pg (ref 26.0–34.0)
MCHC: 33.4 g/dL (ref 30.0–36.0)
MCV: 86.1 fL (ref 80.0–100.0)
Monocytes Absolute: 0.5 10*3/uL (ref 0.1–1.0)
Monocytes Relative: 8 %
Neutro Abs: 3.3 10*3/uL (ref 1.7–7.7)
Neutrophils Relative %: 57 %
Platelets: 325 10*3/uL (ref 150–400)
RBC: 5.04 MIL/uL (ref 4.22–5.81)
RDW: 13.1 % (ref 11.5–15.5)
WBC: 5.9 10*3/uL (ref 4.0–10.5)
nRBC: 0 % (ref 0.0–0.2)

## 2018-03-31 LAB — I-STAT TROPONIN, ED
Troponin i, poc: 0 ng/mL (ref 0.00–0.08)
Troponin i, poc: 0 ng/mL (ref 0.00–0.08)

## 2018-03-31 LAB — LIPASE, BLOOD: Lipase: 26 U/L (ref 11–51)

## 2018-03-31 MED ORDER — ALBUTEROL SULFATE HFA 108 (90 BASE) MCG/ACT IN AERS
1.0000 | INHALATION_SPRAY | Freq: Once | RESPIRATORY_TRACT | Status: AC
  Administered 2018-03-31: 1 via RESPIRATORY_TRACT
  Filled 2018-03-31: qty 6.7

## 2018-03-31 MED ORDER — AEROCHAMBER PLUS FLO-VU LARGE MISC
Status: AC
  Administered 2018-03-31: 1
  Filled 2018-03-31: qty 1

## 2018-03-31 MED ORDER — AEROCHAMBER PLUS FLO-VU LARGE MISC
1.0000 | Freq: Once | Status: AC
  Administered 2018-03-31: 1

## 2018-03-31 NOTE — ED Notes (Signed)
Patient transported to XR. 

## 2018-03-31 NOTE — Discharge Instructions (Signed)
Return to ED for worsening symptoms, increased chest pain, vomiting or coughing up blood, leg swelling, lightheadedness or loss of consciousness.

## 2018-03-31 NOTE — ED Notes (Signed)
Patient verbalizes understanding of discharge instructions. Opportunity for questioning and answers were provided. Armband removed by staff, pt discharged from ED ambulatory with family.  

## 2018-03-31 NOTE — ED Notes (Signed)
Patient transported to X-ray 

## 2018-03-31 NOTE — ED Triage Notes (Addendum)
Patient here for evaluation of chest pain that began a couple of hours ago while he was at work. Rates pain 6/10. Denies leg swelling/shortness of breath/recent injuries. Endorses cocaine use and alcohol use "a few days ago."

## 2018-03-31 NOTE — ED Provider Notes (Signed)
[facility] EMERGENCY DEPARTMENT Provider Note   CSN: [id number] Arrival date & time: 03/31/18  1313     History   Chief Complaint Chief Complaint  Patient presents with  . Chest Pain    HPI Robert [name] is a 37 y.o. male with a past medical history of asthma, alcohol abuse, tobacco abuse, cocaine abuse who presents to ED for chest pain that began 1-1/2 hours ago while he was at work.  States that the pain is sharp, intermittent and located in the middle of his chest.  He also reports associated shortness of breath and right arm pain, epigastric abdominal pain.  He has a history of similar chest pain about a month ago when he was seen and evaluated in the ED.  He ran out of his inhaler about 1 month ago and has not used it.  He does endorse some wheezing as well.  His last cocaine use was 2 to 3 days ago.  Last alcohol use was yesterday and he states that he drinks "every few days."  Denies any history of MI, DVT, PE, recent immobilization, vomiting, fever, leg swelling.  HPI  Past Medical History:  Diagnosis Date  . Asthma     Patient Active Problem List   Diagnosis Date Noted  . Chest pain 11/03/2013  . Tobacco abuse 11/03/2013    Past Surgical History:  Procedure Laterality Date  . WRIST SURGERY          Home Medications    Prior to Admission medications   Medication Sig Start Date End Date Taking? Authorizing Provider  omeprazole (PRILOSEC) 20 MG capsule Take 1 po BID x 2 weeks then once a day Patient not taking: Reported on 03/31/2018 03/02/18   Rolland Porter, MD  ondansetron (ZOFRAN) 4 MG tablet Take 1 tablet (4 mg total) by mouth every 8 (eight) hours as needed for nausea or vomiting. Patient not taking: Reported on 03/31/2018 11/18/17   Orvan July, NP    Family History Family History  Problem Relation Age of Onset  . Heart failure Father     Social History Social History   Tobacco Use  . Smoking status: Current Every Day Smoker   Packs/day: 1.00    Types: Cigarettes  . Smokeless tobacco: Never Used  Substance Use Topics  . Alcohol use: Yes    Comment: every other day.  2-3, 20 ounce beers   . Drug use: Yes    Types: Marijuana     Allergies   Patient has no known allergies.   Review of Systems Review of Systems  Constitutional: Negative for appetite change, chills and fever.  HENT: Negative for ear pain, rhinorrhea, sneezing and sore throat.   Eyes: Negative for photophobia and visual disturbance.  Respiratory: Positive for shortness of breath and wheezing. Negative for cough and chest tightness.   Cardiovascular: Positive for chest pain. Negative for palpitations.  Gastrointestinal: Negative for abdominal pain, blood in stool, constipation, diarrhea, nausea and vomiting.  Genitourinary: Negative for dysuria, hematuria and urgency.  Musculoskeletal: Negative for myalgias.  Skin: Negative for rash.  Neurological: Negative for dizziness, weakness and light-headedness.     Physical Exam Updated Vital Signs BP 134/84   Pulse 91   Temp 98.5 F (36.9 C) (Oral)   Resp 19   Ht 5\' 10"  (1.778 m)   SpO2 96%   BMI 27.26 kg/m   Physical Exam  Constitutional: He appears well-developed and well-nourished. No distress.  HENT:  Head: Normocephalic  and atraumatic.  Nose: Nose normal.  Eyes: Conjunctivae and EOM are normal. Left eye exhibits no discharge. No scleral icterus.  Neck: Normal range of motion. Neck supple.  Cardiovascular: Normal rate, regular rhythm, normal heart sounds and intact distal pulses. Exam reveals no gallop and no friction rub.  No murmur heard. Pulmonary/Chest: Effort normal and breath sounds normal. No respiratory distress. He exhibits tenderness.  Abdominal: Soft. Bowel sounds are normal. He exhibits no distension. There is tenderness in the epigastric area. There is no guarding.  Musculoskeletal: Normal range of motion. He exhibits no edema.  No lower extremity edema, erythema or  calf tenderness bilaterally.  Neurological: He is alert. He exhibits normal muscle tone. Coordination normal.  Skin: Skin is warm and dry. No rash noted.  Psychiatric: He has a normal mood and affect.  Nursing note and vitals reviewed.    ED Treatments / Results  Labs (all labs ordered are listed, but only abnormal results are displayed) Labs Reviewed  COMPREHENSIVE METABOLIC PANEL - Abnormal; Notable for the following components:      Result Value   Glucose, Bld 114 (*)    ALT 46 (*)    All other components within normal limits  CBC WITH DIFFERENTIAL/PLATELET  LIPASE, BLOOD  I-STAT TROPONIN, ED  I-STAT TROPONIN, ED    EKG EKG Interpretation  Date/Time:  Tuesday March 31 2018 13:22:56 EST Ventricular Rate:  103 PR Interval:    QRS Duration: 84 QT Interval:  358 QTC Calculation: 469 R Axis:   41 Text Interpretation:  Sinus tachycardia Consider left atrial enlargement Abnormal T, consider ischemia, diffuse leads No significant change since last tracing Confirmed by Deno Etienne [id number]) on 03/31/2018 1:26:56 PM   Radiology Dg Chest 2 View  Result Date: 03/31/2018 CLINICAL DATA:  Chest tightness and shortness of breath for the past 2 hours 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 Current smoker. History of asthma. EXAM: CHEST - 2 VIEW COMPARISON:  PA and lateral chest x-ray of March 02, 2018 FINDINGS: The lungs are well-expanded and clear. The heart and pulmonary vascularity are normal. The mediastinum is normal in width. There is no pleural effusion. The trachea is midline. The bony thorax exhibits no acute abnormality. IMPRESSION: There is no active cardiopulmonary disease. Electronically Signed   By: David  Martinique M.D.   On: 03/31/2018 14:13    Procedures Procedures (including critical care time)  Medications Ordered in ED Medications  albuterol (PROVENTIL HFA;VENTOLIN HFA) 108 (90 Base) MCG/ACT inhaler 1 puff (has no administration in time  range)  AEROCHAMBER PLUS FLO-VU LARGE MISC 1 each (has no administration in time range)     Initial Impression / Assessment and Plan / ED Course  I have reviewed the triage vital signs and the nursing notes.  Pertinent labs & imaging results that were available during my care of the patient were reviewed by me and considered in my medical decision making (see chart for details).     37 year old male with past medical history of asthma, alcohol abuse, tobacco abuse presents to ED for chest pain that began 1-1/2 hours prior to arrival while at work.  Pain is sharp, intermittent located in the middle of his chest as well as middle of his abdomen.  He did run out of his inhaler 1 month ago and does endorse some shortness of breath and wheezing.  Last cocaine use was 2 to 3 days ago.  No history of MI, DVT or PE.  Chest pain is not pleuritic.  On exam there is tenderness palpation of the epigastrium and chest.  No rebound or guarding noted.  He is afebrile.  He is not tachycardic, tachypneic or hypoxic.  EKG shows no changes from prior tracings.  Initial and delta troponin are both negative.  CBC, CMP, lipase unremarkable.  Patient was given an inhaler here with improvement in his symptoms.  Chest x-ray is negative.  Suspect that symptoms are musculoskeletal in nature.  Low suspicion for cardiac cause, he is PERC negative.  Will advise him to use his albuterol inhaler as needed and to return to ED for any severe worsening symptoms.  Patient is hemodynamically stable, in NAD, and able to ambulate in the ED. Evaluation does not show pathology that would require ongoing emergent intervention or inpatient treatment. I explained the diagnosis to the patient. Pain has been managed and has no complaints prior to discharge. Patient is comfortable with above plan and is stable for discharge at this time. All questions were answered prior to disposition. Strict return precautions for returning to the ED were  discussed. Encouraged follow up with PCP.    Portions of this note were generated with [employer]. Dictation errors may occur despite best attempts at proofreading.   Final Clinical Impressions(s) / ED Diagnoses   Final diagnoses:  Chest wall pain    ED Discharge Orders    None       Delia Heady, PA-C 03/31/18 [place], [place], DO 04/01/18 [phone]

## 2018-03-31 NOTE — ED Notes (Signed)
Called lab to check on D dimer not in process from 1415. Lab states they do not have the blue top that was collected.

## 2018-08-03 ENCOUNTER — Emergency Department (HOSPITAL_COMMUNITY): Payer: Self-pay

## 2018-08-03 ENCOUNTER — Emergency Department (HOSPITAL_COMMUNITY)
Admission: EM | Admit: 2018-08-03 | Discharge: 2018-08-03 | Disposition: A | Discharge status: Home / Self Care | Payer: Self-pay | Attending: Emergency Medicine | Admitting: Emergency Medicine

## 2018-08-03 ENCOUNTER — Encounter (HOSPITAL_COMMUNITY): Payer: Self-pay

## 2018-08-03 ENCOUNTER — Other Ambulatory Visit: Payer: Self-pay

## 2018-08-03 DIAGNOSIS — R197 Diarrhea, unspecified: Secondary | ICD-10-CM | POA: Insufficient documentation

## 2018-08-03 DIAGNOSIS — J45909 Unspecified asthma, uncomplicated: Secondary | ICD-10-CM | POA: Insufficient documentation

## 2018-08-03 DIAGNOSIS — F17210 Nicotine dependence, cigarettes, uncomplicated: Secondary | ICD-10-CM | POA: Insufficient documentation

## 2018-08-03 LAB — CBC
HCT: 41.4 % (ref 39.0–52.0)
Hemoglobin: 13.9 g/dL (ref 13.0–17.0)
MCH: 29.4 pg (ref 26.0–34.0)
MCHC: 33.6 g/dL (ref 30.0–36.0)
MCV: 87.5 fL (ref 80.0–100.0)
Platelets: 277 10*3/uL (ref 150–400)
RBC: 4.73 MIL/uL (ref 4.22–5.81)
RDW: 13.2 % (ref 11.5–15.5)
WBC: 5 10*3/uL (ref 4.0–10.5)
nRBC: 0 % (ref 0.0–0.2)

## 2018-08-03 LAB — COMPREHENSIVE METABOLIC PANEL
ALT: 26 U/L (ref 0–44)
AST: 25 U/L (ref 15–41)
Albumin: 4.1 g/dL (ref 3.5–5.0)
Alkaline Phosphatase: 55 U/L (ref 38–126)
Anion gap: 10 (ref 5–15)
BUN: 16 mg/dL (ref 6–20)
CO2: 20 mmol/L — ABNORMAL LOW (ref 22–32)
Calcium: 8.6 mg/dL — ABNORMAL LOW (ref 8.9–10.3)
Chloride: 105 mmol/L (ref 98–111)
Creatinine, Ser: 0.82 mg/dL (ref 0.61–1.24)
GFR calc Af Amer: >60 mL/min (ref >60)
GFR calc non Af Amer: >60 mL/min (ref >60)
Glucose, Bld: 104 mg/dL — ABNORMAL HIGH (ref 70–99)
Potassium: 3.7 mmol/L (ref 3.5–5.1)
Sodium: 135 mmol/L (ref 135–145)
Total Bilirubin: 0.8 mg/dL (ref 0.3–1.2)
Total Protein: 7 g/dL (ref 6.5–8.1)

## 2018-08-03 LAB — TROPONIN I: Troponin I: <0.03 ng/mL (ref <0.03)

## 2018-08-03 LAB — LIPASE, BLOOD: Lipase: 24 U/L (ref 11–51)

## 2018-08-03 MED ORDER — SODIUM CHLORIDE 0.9 % IV BOLUS
1000.0000 mL | Freq: Once | INTRAVENOUS | Status: AC
  Administered 2018-08-03: 1000 mL via INTRAVENOUS

## 2018-08-03 MED ORDER — SODIUM CHLORIDE 0.9% FLUSH
3.0000 mL | Freq: Once | INTRAVENOUS | Status: AC
  Administered 2018-08-03: 3 mL via INTRAVENOUS

## 2018-08-03 MED ORDER — ACETAMINOPHEN 325 MG PO TABS
650.0000 mg | ORAL_TABLET | Freq: Once | ORAL | Status: AC
  Administered 2018-08-03: 650 mg via ORAL
  Filled 2018-08-03: qty 2

## 2018-08-03 NOTE — ED Notes (Signed)
Patient transported to X-ray 

## 2018-08-03 NOTE — ED Provider Notes (Signed)
[facility] DEPT Provider Note   CSN: [id number] Arrival date & time: 08/03/18  1721    History   Chief Complaint Chief Complaint  Patient presents with  . Chest Pain  . Generalized Body Aches    HPI Robert [name] is a 38 y.o. male.      Chest Pain  Pain location:  Epigastric Pain quality: aching and dull   Pain radiates to:  Does not radiate Pain severity:  Mild Onset quality:  Gradual Timing:  Intermittent Progression:  Waxing and waning Chronicity:  New Context: eating   Relieved by:  Nothing Worsened by:  Nothing Associated symptoms: abdominal pain and nausea   Associated symptoms: no back pain, no cough, no fever, no lower extremity edema, no numbness, no orthopnea, no palpitations, no shortness of breath, no vomiting and no weakness   Risk factors: smoking   Risk factors: no coronary artery disease, no high cholesterol, no hypertension and no prior DVT/PE     Past Medical History:  Diagnosis Date  . Asthma     Patient Active Problem List   Diagnosis Date Noted  . Chest pain 11/03/2013  . Tobacco abuse 11/03/2013    Past Surgical History:  Procedure Laterality Date  . WRIST SURGERY          Home Medications    Prior to Admission medications   Medication Sig Start Date End Date Taking? Authorizing Provider  omeprazole (PRILOSEC) 20 MG capsule Take 1 po BID x 2 weeks then once a day Patient not taking: Reported on 03/31/2018 03/02/18   Rolland Porter, MD  ondansetron (ZOFRAN) 4 MG tablet Take 1 tablet (4 mg total) by mouth every 8 (eight) hours as needed for nausea or vomiting. Patient not taking: Reported on 03/31/2018 11/18/17   Orvan July, NP    Family History Family History  Problem Relation Age of Onset  . Heart failure Father     Social History Social History   Tobacco Use  . Smoking status: Current Every Day Smoker    Packs/day: 0.50    Types: Cigarettes  . Smokeless tobacco: Never Used  Substance  Use Topics  . Alcohol use: Yes    Comment: every other day.  2-3, 20 ounce beers   . Drug use: Yes    Types: Marijuana     Allergies   Patient has no known allergies.   Review of Systems Review of Systems  Constitutional: Positive for chills. Negative for fever.  HENT: Negative for ear pain and sore throat.   Eyes: Negative for pain and visual disturbance.  Respiratory: Negative for cough and shortness of breath.   Cardiovascular: Positive for chest pain. Negative for palpitations and orthopnea.  Gastrointestinal: Positive for abdominal pain, diarrhea and nausea. Negative for vomiting.  Genitourinary: Negative for dysuria and hematuria.  Musculoskeletal: Positive for myalgias. Negative for arthralgias and back pain.  Skin: Negative for color change and rash.  Neurological: Negative for seizures, syncope, weakness and numbness.  All other systems reviewed and are negative.    Physical Exam Updated Vital Signs  ED Triage Vitals [08/03/18 1727]  Enc Vitals Group     BP (!) 165/108     Pulse Rate 82     Resp 16     Temp 98.7 F (37.1 C)     Temp Source Oral     SpO2 99 %     Weight 190 lb (86.2 kg)     Height 5\' 10"  (1.778 m)  Head Circumference      Peak Flow      Pain Score 5     Pain Loc      Pain Edu?      Excl. in [place]?     Physical Exam Vitals signs and nursing note reviewed.  Constitutional:      General: He is not in acute distress.    Appearance: He is well-developed. He is not ill-appearing.  HENT:     Head: Normocephalic and atraumatic.  Eyes:     Extraocular Movements: Extraocular movements intact.     Conjunctiva/sclera: Conjunctivae normal.     Pupils: Pupils are equal, round, and reactive to light.  Neck:     Musculoskeletal: Normal range of motion and neck supple.  Cardiovascular:     Rate and Rhythm: Normal rate and regular rhythm.     Heart sounds: Normal heart sounds. No murmur.  Pulmonary:     Effort: Pulmonary effort is normal. No  respiratory distress.     Breath sounds: Normal breath sounds. No decreased breath sounds, wheezing, rhonchi or rales.  Abdominal:     Palpations: Abdomen is soft. There is no mass.     Tenderness: There is no abdominal tenderness. There is no guarding or rebound.  Musculoskeletal: Normal range of motion.     Right lower leg: No edema.     Left lower leg: No edema.  Skin:    General: Skin is warm and dry.     Capillary Refill: Capillary refill takes less than 2 seconds.  Neurological:     General: No focal deficit present.     Mental Status: He is alert.  Psychiatric:        Mood and Affect: Mood normal.      ED Treatments / Results  Labs (all labs ordered are listed, but only abnormal results are displayed) Labs Reviewed  COMPREHENSIVE METABOLIC PANEL - Abnormal; Notable for the following components:      Result Value   CO2 20 (*)    Glucose, Bld 104 (*)    Calcium 8.6 (*)    All other components within normal limits  CBC  LIPASE, BLOOD  TROPONIN I    EKG EKG Interpretation  Date/Time:  Monday August 03 2018 17:31:20 EDT Ventricular Rate:  74 PR Interval:    QRS Duration: 84 QT Interval:  388 QTC Calculation: 431 R Axis:   42 Text Interpretation:  Sinus rhythm Abnormal T, probable ischemia, widespread No significant change since last tracing Confirmed by Lennice Sites [phone]) on 08/03/2018 5:34:07 PM   Radiology Dg Chest 2 View  Result Date: 08/03/2018 CLINICAL DATA:  Left-sided chest pain for 3 days.  Smoker.  Asthma. EXAM: CHEST - 2 VIEW COMPARISON:  03/31/2018 FINDINGS: The heart size and mediastinal contours are within normal limits. Both lungs are clear. The visualized skeletal structures are unremarkable. IMPRESSION: No active cardiopulmonary disease. Electronically Signed   By: Earle Gell M.D.   On: 08/03/2018 17:57    Procedures Procedures (including critical care time)  Medications Ordered in ED Medications  sodium chloride flush (NS) 0.9 %  injection 3 mL (3 mLs Intravenous Given 08/03/18 1844)  sodium chloride 0.9 % bolus 1,000 mL (1,000 mLs Intravenous New Bag/Given 08/03/18 1844)  acetaminophen (TYLENOL) tablet 650 mg (650 mg Oral Given 08/03/18 1843)     Initial Impression / Assessment and Plan / ED Course  I have reviewed the triage vital signs and the nursing notes.  Pertinent labs &  imaging results that were available during my care of the patient were reviewed by me and considered in my medical decision making (see chart for details).     Robert [name] is a 38 year old male history of asthma who presents to the ED with multiple complaints.  Patient with unremarkable vitals.  No fever.  Patient has had some intermittent chest pain, epigastric abdominal pain, left upper abdominal pain on and off for the last several days.  Has had diarrhea.  Has had some nausea.  Denies any fever but has had body aches.  No recent travel, no known coronavirus exposures.  Patient overall well-appearing.  Has no specific abdominal tenderness on exam.  Clear breath sounds.  EKG shows sinus rhythm.  T wave inversions are not new.  Patient has noncardiac sounding chest pain.  No concern for PE.  Patient is PERC negative.  Does not have any cardiac risk factors.  Troponin within normal limits.  Likely pain is more abdominal in nature.  However, no signs of peritonitis.  Patient had lab work that showed no significant anemia, electrolyte abnormality, kidney injury.  Gallbladder and liver enzymes within normal limits.  Chest x-ray showed no signs of pneumonia, no pneumothorax, no pleural effusion.  Possible viral gastroenteritis versus gastritis.  Recommend continued hydration at home.  Tylenol/Motrin as needed for aches.  Told to follow-up with primary care doctor and given return precautions.  Patient denies any cough, shortness of breath, fever no concern for coronavirus.  This chart was dictated using voice recognition software.  Despite best efforts to  proofread,  errors can occur which can change the documentation meaning.    Final Clinical Impressions(s) / ED Diagnoses   Final diagnoses:  Diarrhea, unspecified type    ED Discharge Orders    None       Lennice Sites, DO 08/03/18 1858

## 2018-08-03 NOTE — ED Triage Notes (Signed)
Pt states that he has been having left sided chest pain down to the epigastric area, as well as LUQ abdominal pain.  Pt states that he was feeling unwell since Saturday, unable to get out of bed. 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

## 2019-01-06 ENCOUNTER — Other Ambulatory Visit: Payer: Self-pay

## 2019-01-06 DIAGNOSIS — Z20822 Contact with and (suspected) exposure to covid-19: Secondary | ICD-10-CM

## 2019-01-07 LAB — NOVEL CORONAVIRUS, NAA: SARS-CoV-2, NAA: NOT DETECTED

## 2019-02-05 ENCOUNTER — Emergency Department (HOSPITAL_COMMUNITY)
Admission: EM | Admit: 2019-02-05 | Discharge: 2019-02-05 | Disposition: A | Discharge status: Home / Self Care | Payer: Self-pay | Attending: Emergency Medicine | Admitting: Emergency Medicine

## 2019-02-05 ENCOUNTER — Encounter (HOSPITAL_COMMUNITY): Payer: Self-pay | Admitting: Emergency Medicine

## 2019-02-05 ENCOUNTER — Other Ambulatory Visit: Payer: Self-pay

## 2019-02-05 DIAGNOSIS — Z52098 Other blood donor, other blood: Secondary | ICD-10-CM | POA: Insufficient documentation

## 2019-02-05 DIAGNOSIS — F17210 Nicotine dependence, cigarettes, uncomplicated: Secondary | ICD-10-CM | POA: Insufficient documentation

## 2019-02-05 DIAGNOSIS — R58 Hemorrhage, not elsewhere classified: Secondary | ICD-10-CM

## 2019-02-05 DIAGNOSIS — I97618 Postprocedural hemorrhage and hematoma of a circulatory system organ or structure following other circulatory system procedure: Secondary | ICD-10-CM | POA: Insufficient documentation

## 2019-02-05 DIAGNOSIS — J45909 Unspecified asthma, uncomplicated: Secondary | ICD-10-CM | POA: Insufficient documentation

## 2019-02-05 NOTE — ED Provider Notes (Signed)
[facility] DEPT Provider Note   CSN: [id number] Arrival date & time: 02/05/19  2021     History   Chief Complaint Chief Complaint  Patient presents with  . bleeding after getting plasma    HPI Robert [name] is a 37 y.o. male.     38 year old male presents to the ER for bleeding from his right arm.  Patient donated plasma today, removed his bandage several hours later and had bleeding at the site.  Bandages were placed in triage and bleeding is controlled.  No other complaints or concerns.  Patient is not on blood thinners.     Past Medical History:  Diagnosis Date  . Asthma     Patient Active Problem List   Diagnosis Date Noted  . Chest pain 11/03/2013  . Tobacco abuse 11/03/2013    Past Surgical History:  Procedure Laterality Date  . WRIST SURGERY          Home Medications    Prior to Admission medications   Medication Sig Start Date End Date Taking? Authorizing Provider  omeprazole (PRILOSEC) 20 MG capsule Take 1 po BID x 2 weeks then once a day Patient not taking: Reported on 03/31/2018 03/02/18   Rolland Porter, MD  ondansetron (ZOFRAN) 4 MG tablet Take 1 tablet (4 mg total) by mouth every 8 (eight) hours as needed for nausea or vomiting. Patient not taking: Reported on 03/31/2018 11/18/17   Orvan July, NP    Family History Family History  Problem Relation Age of Onset  . Heart failure Father     Social History Social History   Tobacco Use  . Smoking status: Current Every Day Smoker    Packs/day: 0.50    Types: Cigarettes  . Smokeless tobacco: Never Used  Substance Use Topics  . Alcohol use: Yes    Comment: every other day.  2-3, 20 ounce beers   . Drug use: Yes    Types: Marijuana     Allergies   Patient has no known allergies.   Review of Systems Review of Systems  Constitutional: Negative for fever.  Skin: Negative for color change.  Allergic/Immunologic: Negative for immunocompromised state.   Neurological: Negative for weakness and light-headedness.  All other systems reviewed and are negative.    Physical Exam Updated Vital Signs BP (!) 152/96 (BP Location: Left Arm)   Pulse 94   Temp 99.3 F (37.4 C) (Oral)   Resp 16   Ht 5\' 10"  (1.778 m)   Wt 85.3 kg   SpO2 100%   BMI 26.98 kg/m   Physical Exam Vitals signs and nursing note reviewed.  Constitutional:      General: He is not in acute distress.    Appearance: He is well-developed. He is not diaphoretic.  HENT:     Head: Normocephalic and atraumatic.  Cardiovascular:     Pulses: Normal pulses.  Pulmonary:     Effort: Pulmonary effort is normal.  Skin:    General: Skin is warm and dry.     Findings: No erythema or rash.  Neurological:     Mental Status: He is alert and oriented to person, place, and time.  Psychiatric:        Behavior: Behavior normal.      ED Treatments / Results  Labs (all labs ordered are listed, but only abnormal results are displayed) Labs Reviewed - No data to display  EKG None  Radiology No results found.  Procedures Procedures (including critical care  time)  Medications Ordered in ED Medications - No data to display   Initial Impression / Assessment and Plan / ED Course  I have reviewed the triage vital signs and the nursing notes.  Pertinent labs & imaging results that were available during my care of the patient were reviewed by me and considered in my medical decision making (see chart for details).  Clinical Course as of Feb 05 2107  Fri Feb 05, 5759  3725 38 year old male presents with bleeding from the right [facility] after removing bandage from plasma donation today.  Bandage was placed in triage, bleeding was controlled.  Denies symptomatic anemia and is ready for discharge.  Advised patient to loosen bandage before going to bed, remove at anytime for any tingling in his hands.   [LM]    Clinical Course User Index [LM] Tacy Learn, PA-C      Final  Clinical Impressions(s) / ED Diagnoses   Final diagnoses:  Bleeding    ED Discharge Orders    None       Roque Lias 02/05/19 2108    Lucrezia Starch, MD 02/06/19 [phone]

## 2019-02-05 NOTE — ED Notes (Signed)
Patients are rewrapped. Right ac is currently not bleeding. Patient is requesting to leave.

## 2019-02-05 NOTE — ED Triage Notes (Signed)
Patient states he was bleeding after giving plasma. He states this was around 5pm. Patient right AC. Patient states that it seems that it has stopped now.

## 2019-02-05 NOTE — Discharge Instructions (Addendum)
Loosen bandage before bed. Remove if your fingers tingle or change colors.

## 2019-04-19 ENCOUNTER — Other Ambulatory Visit: Payer: Self-pay

## 2019-04-19 DIAGNOSIS — Z20822 Contact with and (suspected) exposure to covid-19: Secondary | ICD-10-CM

## 2019-04-21 LAB — NOVEL CORONAVIRUS, NAA: SARS-CoV-2, NAA: NOT DETECTED

## 2019-07-06 ENCOUNTER — Ambulatory Visit: Payer: BC Managed Care – PPO | Admitting: Adult Health

## 2019-08-03 ENCOUNTER — Other Ambulatory Visit: Payer: Self-pay

## 2019-08-03 ENCOUNTER — Encounter (HOSPITAL_COMMUNITY): Payer: Self-pay | Admitting: Emergency Medicine

## 2019-08-03 ENCOUNTER — Ambulatory Visit (HOSPITAL_COMMUNITY)
Admission: EM | Admit: 2019-08-03 | Discharge: 2019-08-03 | Disposition: A | Discharge status: Home / Self Care | Payer: BC Managed Care – PPO | Attending: Physician Assistant | Admitting: Physician Assistant

## 2019-08-03 DIAGNOSIS — R1013 Epigastric pain: Secondary | ICD-10-CM | POA: Insufficient documentation

## 2019-08-03 DIAGNOSIS — F1010 Alcohol abuse, uncomplicated: Secondary | ICD-10-CM | POA: Diagnosis not present

## 2019-08-03 DIAGNOSIS — J45909 Unspecified asthma, uncomplicated: Secondary | ICD-10-CM | POA: Diagnosis not present

## 2019-08-03 DIAGNOSIS — Z8249 Family history of ischemic heart disease and other diseases of the circulatory system: Secondary | ICD-10-CM | POA: Insufficient documentation

## 2019-08-03 DIAGNOSIS — K920 Hematemesis: Secondary | ICD-10-CM | POA: Insufficient documentation

## 2019-08-03 DIAGNOSIS — R197 Diarrhea, unspecified: Secondary | ICD-10-CM | POA: Diagnosis not present

## 2019-08-03 DIAGNOSIS — R195 Other fecal abnormalities: Secondary | ICD-10-CM | POA: Insufficient documentation

## 2019-08-03 DIAGNOSIS — Z20822 Contact with and (suspected) exposure to covid-19: Secondary | ICD-10-CM | POA: Diagnosis not present

## 2019-08-03 DIAGNOSIS — R112 Nausea with vomiting, unspecified: Secondary | ICD-10-CM | POA: Diagnosis not present

## 2019-08-03 DIAGNOSIS — F17210 Nicotine dependence, cigarettes, uncomplicated: Secondary | ICD-10-CM | POA: Diagnosis not present

## 2019-08-03 LAB — CBC
HCT: 43.2 % (ref 39.0–52.0)
Hemoglobin: 14.6 g/dL (ref 13.0–17.0)
MCH: 30.9 pg (ref 26.0–34.0)
MCHC: 33.8 g/dL (ref 30.0–36.0)
MCV: 91.5 fL (ref 80.0–100.0)
Platelets: 227 10*3/uL (ref 150–400)
RBC: 4.72 MIL/uL (ref 4.22–5.81)
RDW: 13 % (ref 11.5–15.5)
WBC: 4.2 10*3/uL (ref 4.0–10.5)
nRBC: 0 % (ref 0.0–0.2)

## 2019-08-03 LAB — COMPREHENSIVE METABOLIC PANEL
ALT: 118 U/L — ABNORMAL HIGH (ref 0–44)
AST: 96 U/L — ABNORMAL HIGH (ref 15–41)
Albumin: 4.5 g/dL (ref 3.5–5.0)
Alkaline Phosphatase: 45 U/L (ref 38–126)
Anion gap: 13 (ref 5–15)
BUN: 10 mg/dL (ref 6–20)
CO2: 25 mmol/L (ref 22–32)
Calcium: 8.8 mg/dL — ABNORMAL LOW (ref 8.9–10.3)
Chloride: 101 mmol/L (ref 98–111)
Creatinine, Ser: 0.94 mg/dL (ref 0.61–1.24)
GFR calc Af Amer: >60 mL/min (ref >60)
GFR calc non Af Amer: >60 mL/min (ref >60)
Glucose, Bld: 98 mg/dL (ref 70–99)
Potassium: 4.4 mmol/L (ref 3.5–5.1)
Sodium: 139 mmol/L (ref 135–145)
Total Bilirubin: 1.2 mg/dL (ref 0.3–1.2)
Total Protein: 7.3 g/dL (ref 6.5–8.1)

## 2019-08-03 LAB — SARS CORONAVIRUS 2 (TAT 6-24 HRS): SARS Coronavirus 2: NEGATIVE

## 2019-08-03 LAB — LIPASE, BLOOD: Lipase: 23 U/L (ref 11–51)

## 2019-08-03 LAB — PROTIME-INR
INR: 0.9 (ref 0.8–1.2)
Prothrombin Time: 12 seconds (ref 11.4–15.2)

## 2019-08-03 MED ORDER — SUCRALFATE 1 GM/10ML PO SUSP: 1.0000 g | Freq: Three times a day (TID) | ORAL | 0 refills | Status: DC

## 2019-08-03 MED ORDER — ONDANSETRON HCL 4 MG PO TABS: 4.0000 mg | ORAL_TABLET | Freq: Three times a day (TID) | ORAL | 0 refills | Status: DC | PRN

## 2019-08-03 MED ORDER — OMEPRAZOLE 20 MG PO CPDR: 20.0000 mg | DELAYED_RELEASE_CAPSULE | Freq: Every day | ORAL | 0 refills | Status: DC

## 2019-08-03 NOTE — ED Triage Notes (Signed)
epigastric pain started on Friday, intermittent, patient had vomiting over the weekend.  Had diarrhea on friday

## 2019-08-03 NOTE — ED Provider Notes (Signed)
[place]    CSN: [id number] Arrival date & time: 08/03/19  1217      History   Chief Complaint Chief Complaint  Patient presents with  . Abdominal Pain    HPI Robert [name] is a 39 y.o. male.   Patient history of alcohol use reports for evaluation of abdominal pain, vomiting and diarrhea.  He reports diarrhea occurred on Friday 3/19 but is since stopped.  He reports it was liquidy and brown.  He reports Sunday 3/21 that he had on and off epigastric pain.  He had numerous episodes of vomiting Sunday night. He does report that he may have been intoxicated at that time did have room spinning sensations at times. He does report there being blood mixed into the vomit. This blood was not with every episode of vomiting and definitively mixed in with vomiting not pure blood. He has not had vomiting since that time.  He has also been spitting up blood over the last week or so as well.  He has had on and off epigastric pain yesterday and today.  He denies fever but endorses some chills intermittently.  He reports in clinic today feels fairly well.  He does endorse having a little bit of epigastric pain prior to beginning of visit.  He does report having to airplane bottle size liquor drinks today.  He also endorses a tremor.  He notices the shaking tremor on days that he is not drinking alcohol.  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  He notes that the tremor immediately goes away with drinking alcohol.  He endorses some increased heart rate and anxiety when having this tremor as well.  He reports the symptoms go away after drinking alcohol.  He reports that his alcohol consumption is increased since the beginning of the new year.  He reports drinking 750 mL liquor every other day.  He reports this is increased since last year however he admits drinking quite a bit every other day last year and for a long period of time as well.  He  reports the longest he has gone without drinking in the last 6 months is 3 to 4 days.  He reports having increasing anxiety increasing tremor over that.  That immediately resolved when he began drinking again.  He also reports some sleep disturbances often having very hard time falling asleep.  He reports for some time he has had intermittent dark tarry stools mixed with green stool.  He denies any blood in his stool.  He reports having more formed bowel movements over the weekend however notes a clay colored stool yesterday.  Patient is also a smoker and has some listed marijuana usage.  Denies recent upper respiratory symptoms.  Denies chest pain or shortness of breath.  Denies headache     Past Medical History:  Diagnosis Date  . Asthma     Patient Active Problem List   Diagnosis Date Noted  . Chest pain 11/03/2013  . Tobacco abuse 11/03/2013    Past Surgical History:  Procedure Laterality Date  . WRIST SURGERY         Home Medications    Prior to Admission medications   Medication Sig Start Date End Date Taking? Authorizing Provider  omeprazole (PRILOSEC) 20 MG capsule Take 1 capsule (20 mg total) by mouth daily. 08/03/19 09/02/19  Margrette Wynia, Marguerita Beards, PA-C  ondansetron (ZOFRAN) 4 MG tablet Take 1 tablet (  4 mg total) by mouth every 8 (eight) hours as needed for nausea or vomiting. 08/03/19   Meshach Perry, Marguerita Beards, PA-C  sucralfate (CARAFATE) 1 GM/10ML suspension Take 10 mLs (1 g total) by mouth 4 (four) times daily -  with meals and at bedtime. 08/03/19   Kripa Foskey, Marguerita Beards, PA-C    Family History Family History  Problem Relation Age of Onset  . Heart failure Father     Social History Social History   Tobacco Use  . Smoking status: Current Every Day Smoker    Packs/day: 0.50    Types: Cigarettes  . Smokeless tobacco: Never Used  Substance Use Topics  . Alcohol use: Yes    Comment: every other day.  2-3, 20 ounce beers   . Drug use: Yes    Types: Marijuana     Allergies     Patient has no known allergies.   Review of Systems Review of Systems  Constitutional: Positive for chills. Negative for fever.  HENT: Negative.   Gastrointestinal: Positive for abdominal pain, diarrhea, nausea and vomiting. Negative for blood in stool, constipation and rectal pain.  Genitourinary: Negative for decreased urine volume, dysuria and hematuria.  Musculoskeletal: Negative for arthralgias and myalgias.  Neurological: Negative for headaches.     Physical Exam Triage Vital Signs ED Triage Vitals [08/03/19 1313]  Enc Vitals Group     BP      Pulse      Resp      Temp      Temp src      SpO2      Weight      Height      Head Circumference      Peak Flow      Pain Score 7     Pain Loc      Pain Edu?      Excl. in [place]?    No data found.  Updated Vital Signs BP 114/75 (BP Location: Left Arm)   Pulse 97   Temp 98.6 F (37 C) (Oral)   Resp 16   SpO2 100%   Visual Acuity Right Eye Distance:   Left Eye Distance:   Bilateral Distance:    Right Eye Near:   Left Eye Near:    Bilateral Near:     Physical Exam Vitals and nursing note reviewed.  Constitutional:      General: He is not in acute distress.    Appearance: He is well-developed. He is ill-appearing.  HENT:     Head: Normocephalic and atraumatic.     Mouth/Throat:     Mouth: Mucous membranes are moist.     Pharynx: Oropharynx is clear.  Eyes:     Extraocular Movements: Extraocular movements intact.     Conjunctiva/sclera: Conjunctivae normal.     Pupils: Pupils are equal, round, and reactive to light.     Comments: Slight icterus  Cardiovascular:     Rate and Rhythm: Normal rate and regular rhythm.     Heart sounds: No murmur.  Pulmonary:     Effort: Pulmonary effort is normal. No respiratory distress.     Breath sounds: Normal breath sounds.  Abdominal:     General: Abdomen is protuberant. There is no distension.     Palpations: Abdomen is soft. There is no hepatomegaly or splenomegaly.      Tenderness: There is abdominal tenderness (mild) in the epigastric area. There is no right CVA tenderness, left CVA tenderness, guarding or rebound.  Hernia: No hernia is present.  Musculoskeletal:     Cervical back: Neck supple.  Skin:    General: Skin is warm and dry.     Comments: Difficult to assess whether he has mild jaundice due to skin pigmentation.  Neurological:     General: No focal deficit present.     Mental Status: He is alert.     Comments: Slight tremor hands. No asterixis       UC Treatments / Results  Labs (all labs ordered are listed, but only abnormal results are displayed) Labs Reviewed  COMPREHENSIVE METABOLIC PANEL - Abnormal; Notable for the following components:      Result Value   Calcium 8.8 (*)    AST 96 (*)    ALT 118 (*)    All other components within normal limits  SARS CORONAVIRUS 2 (TAT 6-24 HRS)  CBC  PROTIME-INR  LIPASE, BLOOD    EKG   Radiology No results found.  Procedures Procedures (including critical care time)  Medications Ordered in UC Medications - No data to display  Initial Impression / Assessment and Plan / UC Course  I have reviewed the triage vital signs and the nursing notes.  Pertinent labs & imaging results that were available during my care of the patient were reviewed by me and considered in my medical decision making (see chart for details).   Chart review was conducted and review of previous lab work    #Epigastric pain #Nausea vomiting diarrhea #Blood in vomit #Dark stools Patient 39 year old male with long history of alcohol abuse dating back to 2013 per chart review. He has intermittent epigastric pain as well as concerns for GI bleed at this time. His epigastric pain is not severe and he is hemodynamically well at this time. Differential would include alcoholic gastritis vs chronic or acute mild pancreatitis vs gastric or duodenal ulcer vs hepatitis. Liver function tests were last resulted in  07/2018 and were normal.  LFTs today 96/144 AST/ALT.  Some evidence of some hepatitis, likely alcohol induced vs infectious.  Hemoglobin normal so doubt brisk GI bleed at this time.  Unlikely this is variceal bleeding given intermittent nature.  Lipase was normal however this does not rule out chronic pancreatitis. -Strict emergency department precautions were given if worsening vomiting with large amounts of blood or severe epigastric pain.  - Ambulatory referral to GI made. Instructed patient to establish primary care. -We will start on Prilosec and Carafate today. Zofran prescribed for nausea. -Discussed reduction in alcohol intake at this time however the kidney does have withdrawal symptoms with tremors anxiety instructed patient not to stop cold [place] and explained the dangers of doing so. We discussed that he can follow-up with primary care about ways to minimize his alcohol use safely and quit smoking. -Patient called after visit and discussed lab results.  Reiterated follow-up with GI primary care.   Final Clinical Impressions(s) / UC Diagnoses   Final diagnoses:  Epigastric pain  Nausea vomiting and diarrhea  Symptom of blood in vomit  Dark stools     Discharge Instructions     We have sent lab work and I will call you to discuss the results.  If you begin to vomit have large amounts of blood in your vomit I want you to go immediately to the emergency department.  If you have severe abdominal pain I would like for you to the emergency department  I like for you to cut back on your alcohol intake however  do not stop completely.  Like for you to follow-up with primary care to further discuss this.  As well as establish care and have a reevaluation  I placed a referral to the gastroenterology team and they will contact you about appointment I will also give you a number to call if you have not heard from them in a few days.  Take the medicines as prescribed, Prilosec once a day  and the Carafate up to 4 times a day with meals.  You may use the Zofran as needed for nausea.  If your Covid-19 test is positive, you will receive a phone call from [facility] regarding your results. Negative test results are not called. Both positive and negative results area always visible on MyChart. If you do not have a MyChart account, sign up instructions are in your discharge papers.   Persons who are directed to care for themselves at home may discontinue isolation under the following conditions:  . At least 10 days have passed since symptom onset and . At least 24 hours have passed without running a fever (this means without the use of fever-reducing medications) and . Other symptoms have improved.  Persons infected with COVID-19 who never develop symptoms may discontinue isolation and other precautions 10 days after the date of their first positive COVID-19 test.     ED Prescriptions    Medication Sig Dispense Auth. Provider   omeprazole (PRILOSEC) 20 MG capsule Take 1 capsule (20 mg total) by mouth daily. 30 capsule Rafiel Mecca, Marguerita Beards, PA-C   sucralfate (CARAFATE) 1 GM/10ML suspension Take 10 mLs (1 g total) by mouth 4 (four) times daily -  with meals and at bedtime. 420 mL Keri Tavella, Marguerita Beards, PA-C   ondansetron (ZOFRAN) 4 MG tablet Take 1 tablet (4 mg total) by mouth every 8 (eight) hours as needed for nausea or vomiting. 8 tablet Keyonte Cookston, Marguerita Beards, PA-C     PDMP not reviewed this encounter.   Purnell Shoemaker, PA-C 08/03/19 [phone]

## 2019-08-03 NOTE — Discharge Instructions (Addendum)
We have sent lab work and I will call you to discuss the results.  If you begin to vomit have large amounts of blood in your vomit I want you to go immediately to the emergency department.  If you have severe abdominal pain I would like for you to the emergency department  I like for you to cut back on your alcohol intake however do not stop completely.  Like for you to follow-up with primary care to further discuss this.  As well as establish care and have a reevaluation  I placed a referral to the gastroenterology team and they will contact you about appointment I will also give you a number to call if you have not heard from them in a few days.  Take the medicines as prescribed, Prilosec once a day and the Carafate up to 4 times a day with meals.  You may use the Zofran as needed for nausea.  If your Covid-19 test is positive, you will receive a phone call from [facility] regarding your results. Negative test results are not called. Both positive and negative results area always visible on MyChart. If you do not have a MyChart account, sign up instructions are in your discharge papers.   Persons who are directed to care for themselves at home may discontinue isolation under the following conditions:   At least 10 days have passed since symptom onset and  At least 24 hours have passed without running a fever (this means without the use of fever-reducing medications) and  Other symptoms have improved.  Persons infected with COVID-19 who never develop symptoms may discontinue isolation and other precautions 10 days after the date of their first positive COVID-19 test.

## 2019-09-13 ENCOUNTER — Ambulatory Visit: Payer: BC Managed Care – PPO | Attending: Internal Medicine

## 2019-09-15 ENCOUNTER — Ambulatory Visit: Payer: BC Managed Care – PPO | Attending: Internal Medicine

## 2019-09-15 DIAGNOSIS — Z20822 Contact with and (suspected) exposure to covid-19: Secondary | ICD-10-CM

## 2019-09-16 LAB — SARS-COV-2, NAA 2 DAY TAT

## 2019-09-16 LAB — NOVEL CORONAVIRUS, NAA: SARS-CoV-2, NAA: NOT DETECTED

## 2019-10-04 ENCOUNTER — Encounter (HOSPITAL_COMMUNITY): Payer: Self-pay

## 2019-10-04 ENCOUNTER — Inpatient Hospital Stay (HOSPITAL_COMMUNITY)
Admission: EM | Admit: 2019-10-04 | Discharge: 2019-10-13 | DRG: 286 | Disposition: A | Discharge status: Home / Self Care | Payer: BC Managed Care – PPO | Attending: Cardiovascular Disease | Admitting: Cardiovascular Disease

## 2019-10-04 ENCOUNTER — Emergency Department (HOSPITAL_COMMUNITY): Payer: BC Managed Care – PPO

## 2019-10-04 ENCOUNTER — Other Ambulatory Visit: Payer: Self-pay

## 2019-10-04 DIAGNOSIS — I2602 Saddle embolus of pulmonary artery with acute cor pulmonale: Secondary | ICD-10-CM | POA: Diagnosis not present

## 2019-10-04 DIAGNOSIS — R000 Tachycardia, unspecified: Secondary | ICD-10-CM | POA: Diagnosis not present

## 2019-10-04 DIAGNOSIS — I5043 Acute on chronic combined systolic (congestive) and diastolic (congestive) heart failure: Secondary | ICD-10-CM | POA: Diagnosis present

## 2019-10-04 DIAGNOSIS — Z720 Tobacco use: Secondary | ICD-10-CM | POA: Diagnosis present

## 2019-10-04 DIAGNOSIS — I472 Ventricular tachycardia: Secondary | ICD-10-CM | POA: Diagnosis present

## 2019-10-04 DIAGNOSIS — Z8249 Family history of ischemic heart disease and other diseases of the circulatory system: Secondary | ICD-10-CM | POA: Diagnosis not present

## 2019-10-04 DIAGNOSIS — I272 Pulmonary hypertension, unspecified: Secondary | ICD-10-CM | POA: Diagnosis present

## 2019-10-04 DIAGNOSIS — I5021 Acute systolic (congestive) heart failure: Secondary | ICD-10-CM | POA: Diagnosis not present

## 2019-10-04 DIAGNOSIS — I420 Dilated cardiomyopathy: Secondary | ICD-10-CM | POA: Diagnosis present

## 2019-10-04 DIAGNOSIS — I426 Alcoholic cardiomyopathy: Secondary | ICD-10-CM | POA: Diagnosis present

## 2019-10-04 DIAGNOSIS — I5022 Chronic systolic (congestive) heart failure: Secondary | ICD-10-CM

## 2019-10-04 DIAGNOSIS — F17210 Nicotine dependence, cigarettes, uncomplicated: Secondary | ICD-10-CM | POA: Diagnosis present

## 2019-10-04 DIAGNOSIS — J45909 Unspecified asthma, uncomplicated: Secondary | ICD-10-CM | POA: Diagnosis present

## 2019-10-04 DIAGNOSIS — I2699 Other pulmonary embolism without acute cor pulmonale: Secondary | ICD-10-CM | POA: Diagnosis present

## 2019-10-04 DIAGNOSIS — I428 Other cardiomyopathies: Secondary | ICD-10-CM | POA: Diagnosis not present

## 2019-10-04 DIAGNOSIS — F1010 Alcohol abuse, uncomplicated: Secondary | ICD-10-CM | POA: Diagnosis not present

## 2019-10-04 DIAGNOSIS — I493 Ventricular premature depolarization: Secondary | ICD-10-CM | POA: Diagnosis present

## 2019-10-04 DIAGNOSIS — I959 Hypotension, unspecified: Secondary | ICD-10-CM | POA: Diagnosis not present

## 2019-10-04 DIAGNOSIS — Z20822 Contact with and (suspected) exposure to covid-19: Secondary | ICD-10-CM | POA: Diagnosis present

## 2019-10-04 DIAGNOSIS — Z841 Family history of disorders of kidney and ureter: Secondary | ICD-10-CM

## 2019-10-04 DIAGNOSIS — R0602 Shortness of breath: Secondary | ICD-10-CM | POA: Diagnosis not present

## 2019-10-04 DIAGNOSIS — F1490 Cocaine use, unspecified, uncomplicated: Secondary | ICD-10-CM | POA: Diagnosis present

## 2019-10-04 DIAGNOSIS — I509 Heart failure, unspecified: Secondary | ICD-10-CM

## 2019-10-04 DIAGNOSIS — Z833 Family history of diabetes mellitus: Secondary | ICD-10-CM

## 2019-10-04 DIAGNOSIS — N179 Acute kidney failure, unspecified: Secondary | ICD-10-CM | POA: Diagnosis present

## 2019-10-04 DIAGNOSIS — I5082 Biventricular heart failure: Secondary | ICD-10-CM | POA: Diagnosis present

## 2019-10-04 DIAGNOSIS — I313 Pericardial effusion (noninflammatory): Secondary | ICD-10-CM | POA: Diagnosis present

## 2019-10-04 DIAGNOSIS — E875 Hyperkalemia: Secondary | ICD-10-CM | POA: Diagnosis not present

## 2019-10-04 DIAGNOSIS — I248 Other forms of acute ischemic heart disease: Secondary | ICD-10-CM | POA: Diagnosis present

## 2019-10-04 DIAGNOSIS — I5041 Acute combined systolic (congestive) and diastolic (congestive) heart failure: Secondary | ICD-10-CM | POA: Diagnosis not present

## 2019-10-04 DIAGNOSIS — I340 Nonrheumatic mitral (valve) insufficiency: Secondary | ICD-10-CM | POA: Diagnosis not present

## 2019-10-04 LAB — PROTIME-INR
INR: 1.1 (ref 0.8–1.2)
Prothrombin Time: 14.1 seconds (ref 11.4–15.2)

## 2019-10-04 LAB — CBC
HCT: 41.2 % (ref 39.0–52.0)
Hemoglobin: 13.3 g/dL (ref 13.0–17.0)
MCH: 29.9 pg (ref 26.0–34.0)
MCHC: 32.3 g/dL (ref 30.0–36.0)
MCV: 92.6 fL (ref 80.0–100.0)
Platelets: 286 10*3/uL (ref 150–400)
RBC: 4.45 MIL/uL (ref 4.22–5.81)
RDW: 12.9 % (ref 11.5–15.5)
WBC: 5.2 10*3/uL (ref 4.0–10.5)
nRBC: 0 % (ref 0.0–0.2)

## 2019-10-04 LAB — BASIC METABOLIC PANEL
Anion gap: 11 (ref 5–15)
BUN: 19 mg/dL (ref 6–20)
CO2: 20 mmol/L — ABNORMAL LOW (ref 22–32)
Calcium: 8.3 mg/dL — ABNORMAL LOW (ref 8.9–10.3)
Chloride: 107 mmol/L (ref 98–111)
Creatinine, Ser: 0.96 mg/dL (ref 0.61–1.24)
GFR calc Af Amer: >60 mL/min (ref >60)
GFR calc non Af Amer: >60 mL/min (ref >60)
Glucose, Bld: 120 mg/dL — ABNORMAL HIGH (ref 70–99)
Potassium: 3.5 mmol/L (ref 3.5–5.1)
Sodium: 138 mmol/L (ref 135–145)

## 2019-10-04 LAB — TROPONIN I (HIGH SENSITIVITY)
Troponin I (High Sensitivity): 22 ng/L — ABNORMAL HIGH (ref <18)
Troponin I (High Sensitivity): 23 ng/L — ABNORMAL HIGH (ref <18)

## 2019-10-04 LAB — APTT: aPTT: 27 seconds (ref 24–36)

## 2019-10-04 LAB — HIV ANTIBODY (ROUTINE TESTING W REFLEX): HIV Screen 4th Generation wRfx: NONREACTIVE

## 2019-10-04 LAB — TSH: TSH: 1.856 u[IU]/mL (ref 0.350–4.500)

## 2019-10-04 LAB — SARS CORONAVIRUS 2 BY RT PCR (HOSPITAL ORDER, PERFORMED IN ~~LOC~~ HOSPITAL LAB): SARS Coronavirus 2: NEGATIVE

## 2019-10-04 LAB — BRAIN NATRIURETIC PEPTIDE: B Natriuretic Peptide: 2614.9 pg/mL — ABNORMAL HIGH (ref 0.0–100.0)

## 2019-10-04 MED ORDER — ONDANSETRON HCL 4 MG PO TABS: 4.0000 mg | ORAL_TABLET | Freq: Four times a day (QID) | ORAL | Status: DC | PRN

## 2019-10-04 MED ORDER — SODIUM CHLORIDE 0.9% FLUSH
3.0000 mL | Freq: Two times a day (BID) | INTRAVENOUS | Status: DC
  Administered 2019-10-05 – 2019-10-09 (×3): 3 mL via INTRAVENOUS

## 2019-10-04 MED ORDER — SODIUM CHLORIDE 0.9% FLUSH: 3.0000 mL | Freq: Once | INTRAVENOUS | Status: DC

## 2019-10-04 MED ORDER — IOHEXOL 350 MG/ML SOLN: 100.0000 mL | Freq: Once | INTRAVENOUS | Status: DC | PRN

## 2019-10-04 MED ORDER — FUROSEMIDE 10 MG/ML IJ SOLN
20.0000 mg | Freq: Two times a day (BID) | INTRAMUSCULAR | Status: DC
  Administered 2019-10-04 – 2019-10-05 (×2): 20 mg via INTRAVENOUS
  Filled 2019-10-04 (×2): qty 4

## 2019-10-04 MED ORDER — HEPARIN (PORCINE) 25000 UT/250ML-% IV SOLN
1300.0000 [IU]/h | INTRAVENOUS | Status: DC
  Administered 2019-10-04 – 2019-10-06 (×3): 1300 [IU]/h via INTRAVENOUS
  Filled 2019-10-04 (×3): qty 250

## 2019-10-04 MED ORDER — SODIUM CHLORIDE (PF) 0.9 % IJ SOLN
INTRAMUSCULAR | Status: AC
  Filled 2019-10-04: qty 50

## 2019-10-04 MED ORDER — SODIUM CHLORIDE 0.9% FLUSH: 3.0000 mL | INTRAVENOUS | Status: DC | PRN

## 2019-10-04 MED ORDER — SODIUM CHLORIDE 0.9 % IV SOLN
250.0000 mL | INTRAVENOUS | Status: DC | PRN
  Administered 2019-10-07: 250 mL via INTRAVENOUS

## 2019-10-04 MED ORDER — HEPARIN BOLUS VIA INFUSION
4000.0000 [IU] | Freq: Once | INTRAVENOUS | Status: AC
  Administered 2019-10-04: 4000 [IU] via INTRAVENOUS
  Filled 2019-10-04: qty 4000

## 2019-10-04 MED ORDER — FUROSEMIDE 10 MG/ML IJ SOLN
40.0000 mg | Freq: Once | INTRAMUSCULAR | Status: AC
  Administered 2019-10-04: 40 mg via INTRAVENOUS
  Filled 2019-10-04: qty 4

## 2019-10-04 MED ORDER — FUROSEMIDE 10 MG/ML IJ SOLN
40.0000 mg | Freq: Two times a day (BID) | INTRAMUSCULAR | Status: DC
  Administered 2019-10-04 – 2019-10-06 (×4): 40 mg via INTRAVENOUS
  Filled 2019-10-04 (×4): qty 4

## 2019-10-04 MED ORDER — IOHEXOL 350 MG/ML SOLN
100.0000 mL | Freq: Once | INTRAVENOUS | Status: AC | PRN
  Administered 2019-10-04: 100 mL via INTRAVENOUS

## 2019-10-04 MED ORDER — ONDANSETRON HCL 4 MG/2ML IJ SOLN: 4.0000 mg | Freq: Four times a day (QID) | INTRAMUSCULAR | Status: DC | PRN

## 2019-10-04 NOTE — H&P (Addendum)
History and Physical    Robert [name] VOZ:[id number] DOB: [birth year] DOA: 10/04/2019  PCP: Patient, No Pcp Per   Patient coming from: Home  I have personally briefly reviewed patient's old medical records in [place]  Chief Complaint: Chest pain                                Shortness of breath  HPI: Robert [name] is a 39 y.o. male with medical history significant for asthma who presents to emergency room for evaluation of a 1 week history of shortness of breath which initially was with exertion but he is not short of breath at rest.  He also complains of midsternal chest pain, intermittent, nonradiating and occurs with exertion.  Chest pain is associated with palpitations but he denies  having any nausea, vomiting or diaphoresis.  He does not have a family history of coronary artery disease and has never had symptoms like this in the past.  Due to the persistence of his symptoms he presented to the emergency room for further evaluation. He denies having any fever or chills, he denies feeling dizzy or lightheaded, he denies having any abdominal pain or urinary symptoms. Twelve-lead EKG shows sinus tachycardia with biatrial enlargement. Labs reveal elevated BNP levels and slight elevation of his troponin levels Chest x-ray shows Cardiomegaly with pulmonary vascular congestion. Mild mid lung atelectatic change. Lungs otherwise clear. No evident adenopathy. Patient had a CTA of the chest which showed acute nonocclusive lobar and segmental pulmonary emboli in the left lower lobe. Positive for acute PE with CT evidence of right heart strain (RV/LV Ratio = 1.1) consistent with at least submassive (intermediate risk) PE. The presence of right heart strain has been associated with an increased risk of morbidity and mortality. Moderate cardiomegaly with small to moderate pericardial effusion, small right and trace left pleural effusions, and mild interstitial pulmonary edema, consistent with  congestive heart failure. Subsegmental atelectasis in both lower lobes with more focal patchy opacity and small pneumatocele in the medial aspect of the left lower lobe that may be postinfectious or postinflammatory scarring. Given the patient's smoking history, follow-up chest CT in 3 months is recommended.   ED Course: Patient seen in the emergency room for evaluation of chest pain and shortness of breath mostly with exertion which has been going on intermittently for a week.  Patient's labs show an elevated BNP level.  Patient will be admitted to the hospital for further evaluation  Review of Systems: As per HPI otherwise 10 point review of systems negative.    Past Medical History:  Diagnosis Date  . Asthma     Past Surgical History:  Procedure Laterality Date  . WRIST SURGERY       reports that he has been smoking cigarettes. He has been smoking about 0.50 packs per day. He has never used smokeless tobacco. He reports current alcohol use. He reports current drug use. Drug: Marijuana.  No Known Allergies  Family History  Problem Relation Age of Onset  . Heart failure Father   . Diabetes Father   . Kidney disease Father      Prior to Admission medications   Medication Sig Start Date End Date Taking? Authorizing Provider  omeprazole (PRILOSEC) 20 MG capsule Take 1 capsule (20 mg total) by mouth daily. 08/03/19 09/02/19  Darr, Marguerita Beards, PA-C  ondansetron (ZOFRAN) 4 MG tablet Take 1 tablet (4 mg total) by mouth  every 8 (eight) hours as needed for nausea or vomiting. Patient not taking: Reported on 10/04/2019 08/03/19   Darr, Marguerita Beards, PA-C  sucralfate (CARAFATE) 1 GM/10ML suspension Take 10 mLs (1 g total) by mouth 4 (four) times daily -  with meals and at bedtime. Patient not taking: Reported on 10/04/2019 08/03/19   Darr, Marguerita Beards, PA-C    Physical Exam: Vitals:   10/04/19 1400 10/04/19 1415 10/04/19 1430 10/04/19 1500  BP: (!) 118/98 (!) 123/97 (!) 116/100 (!) 124/98  Pulse: (!)  116 (!) 119 (!) 110 (!) 107  Resp: (!) 33 (!) 25 20 18   Temp:      TempSrc:      SpO2: 98% 97% 99% 96%  Weight:      Height:         Vitals:   10/04/19 1400 10/04/19 1415 10/04/19 1430 10/04/19 1500  BP: (!) 118/98 (!) 123/97 (!) 116/100 (!) 124/98  Pulse: (!) 116 (!) 119 (!) 110 (!) 107  Resp: (!) 33 (!) 25 20 18   Temp:      TempSrc:      SpO2: 98% 97% 99% 96%  Weight:      Height:        Constitutional: NAD, alert and oriented x 3.  Appears acutely ill Eyes: PERRL, lids and conjunctivae normal ENMT: Mucous membranes are moist.  Neck: normal, supple, no masses, no thyromegaly Respiratory: Bilateral air entry, no wheezing, no crackles. Normal respiratory effort. No accessory muscle use.  Cardiovascular: Tachycardia, no murmurs / rubs / gallops. 1+ extremity edema. 2+ pedal pulses. No carotid bruits.  Abdomen: no tenderness, no masses palpated. No hepatosplenomegaly. Bowel sounds positive.  Musculoskeletal: no clubbing / cyanosis. No joint deformity upper and lower extremities.  Skin: no rashes, lesions, ulcers.  Neurologic: No gross focal neurologic deficit. Psychiatric: Normal mood and affect.   Labs on Admission: I have personally reviewed following labs and imaging studies  CBC: Recent Labs  Lab 10/04/19 1041  WBC 5.2  HGB 13.3  HCT 41.2  MCV 92.6  PLT 765   Basic Metabolic Panel: Recent Labs  Lab 10/04/19 1041  NA 138  K 3.5  CL 107  CO2 20*  GLUCOSE 120*  BUN 19  CREATININE 0.96  CALCIUM 8.3*   GFR: Estimated Creatinine Clearance: 107.7 mL/min (by C-G formula based on SCr of 0.96 mg/dL). Liver Function Tests: No results for input(s): AST, ALT, ALKPHOS, BILITOT, PROT, ALBUMIN in the last 168 hours. No results for input(s): LIPASE, AMYLASE in the last 168 hours. No results for input(s): AMMONIA in the last 168 hours. Coagulation Profile: No results for input(s): INR, PROTIME in the last 168 hours. Cardiac Enzymes: No results for input(s):  CKTOTAL, CKMB, CKMBINDEX, TROPONINI in the last 168 hours. BNP (last 3 results) No results for input(s): PROBNP in the last 8760 hours. HbA1C: No results for input(s): HGBA1C in the last 72 hours. CBG: No results for input(s): GLUCAP in the last 168 hours. Lipid Profile: No results for input(s): CHOL, HDL, LDLCALC, TRIG, CHOLHDL, LDLDIRECT in the last 72 hours. Thyroid Function Tests: No results for input(s): TSH, T4TOTAL, FREET4, T3FREE, THYROIDAB in the last 72 hours. Anemia Panel: No results for input(s): VITAMINB12, FOLATE, FERRITIN, TIBC, IRON, RETICCTPCT in the last 72 hours. Urine analysis: No results found for: COLORURINE, APPEARANCEUR, LABSPEC, [place], GLUCOSEU, HGBUR, BILIRUBINUR, KETONESUR, PROTEINUR, UROBILINOGEN, NITRITE, LEUKOCYTESUR  Radiological Exams on Admission: DG Chest 2 View  Result Date: 10/04/2019 CLINICAL DATA:  Shortness of breath with  intermittent chest pain EXAM: CHEST - 2 VIEW COMPARISON:  August 03, 2018 FINDINGS: There is cardiomegaly with pulmonary venous hypertension. There is mild atelectatic change in each mid lung. There is no frank edema or airspace opacity. No pleural effusions evident. No adenopathy. No bone lesions. IMPRESSION: Cardiomegaly with pulmonary vascular congestion. Mild midlung atelectatic change. Lungs otherwise clear. No evident adenopathy. Electronically Signed   By: Lowella Grip III M.D.   On: 10/04/2019 10:21   CT ANGIO CHEST PE W OR WO CONTRAST  Result Date: 10/04/2019 CLINICAL DATA:  Intermittent chest pain and palpitations for the past week. EXAM: CT ANGIOGRAPHY CHEST WITH CONTRAST TECHNIQUE: Multidetector CT imaging of the chest was performed using the standard protocol during bolus administration of intravenous contrast. Multiplanar CT image reconstructions and MIPs were obtained to evaluate the vascular anatomy. CONTRAST:  140mL OMNIPAQUE IOHEXOL 350 MG/ML SOLN COMPARISON:  Chest x-ray from same day. FINDINGS: Cardiovascular:  Satisfactory opacification of the pulmonary arteries to the segmental level. Acute nonocclusive lobar and segmental pulmonary emboli in the left lower lobe. No additional pulmonary emboli. Moderate cardiomegaly. Elevated RV/LV ratio measuring 1.1. Small to moderate pericardial effusion. No thoracic aortic aneurysm. Reflux of contrast into the hepatic veins. Mediastinum/Nodes: Prominent subcentimeter mediastinal and hilar lymph nodes are likely reactive. No enlarged axillary lymph nodes. The thyroid gland, trachea, and esophagus demonstrate no significant findings. Lungs/Pleura: Small right and trace left pleural effusions. Scattered mild interlobular septal thickening. Subsegmental atelectasis in both lower lobes with more focal patchy opacity and small pneumatocele in the medial aspect of the left lower lobe. No pneumothorax. Upper Abdomen: No acute abnormality. Musculoskeletal: No chest wall abnormality. No acute or significant osseous findings. Review of the MIP images confirms the above findings. IMPRESSION: 1. Acute nonocclusive lobar and segmental pulmonary emboli in the left lower lobe. Positive for acute PE with CT evidence of right heart strain (RV/LV Ratio = 1.1) consistent with at least submassive (intermediate risk) PE. The presence of right heart strain has been associated with an increased risk of morbidity and mortality. 2. Moderate cardiomegaly with small to moderate pericardial effusion, small right and trace left pleural effusions, and mild interstitial pulmonary edema, consistent with congestive heart failure. 3. Subsegmental atelectasis in both lower lobes with more focal patchy opacity and small pneumatocele in the medial aspect of the left lower lobe that may be postinfectious or postinflammatory scarring. Given the patient's smoking history, follow-up chest CT in 3 months is recommended. Critical Value/emergent results were called by telephone at the time of interpretation on 10/04/2019 at 4:08  pm to provider [facility] , who verbally acknowledged these results. Electronically Signed   By: Titus Dubin M.D.   On: 10/04/2019 16:12    EKG: Independently reviewed.  Sinus tachycardia  Assessment/Plan Principal Problem:   Pulmonary embolism (HCC) Active Problems:   Tobacco abuse   Acute congestive heart failure ([place])    Acute pulmonary embolism Patient presents for evaluation of chest pain or shortness of breath mostly with exertion. He was also tachycardic but had no hypoxia CT angiogram of the chest reveals submassive pulmonary emboli with right heart strain We will start patient on IV heparin We will obtain 2D echocardiogram We will consult interventional radiology We will obtain lower extremity venous Dopplers to rule out DVT   Acute CHF Unclear etiology Follow-up results of 2D echocardiogram We will place patient on IV Lasix We will hold off on beta-blockers for now since he is normotensive and also has a history of  cocaine use.   Nicotine dependence Smoking cessation was discussed with patient in detail We will place patient on nicotine transdermal patch 14 mg daily  DVT prophylaxis: Heparin Code Status: Full Family Communication: Greater than 50% of time was spent discussing patient's condition and plan of care with him at the bedside. All questions and concerns have been addressed. He verbalizes understanding and agrees with the plan Disposition Plan: Back to previous home environment Consults called: Interventional Radiology    Azaliyah Kennard MD [facility]     10/04/2019, 4:21 PM

## 2019-10-04 NOTE — ED Triage Notes (Signed)
Patient c/o intermittent chest pain and heart racing x 1 week. Patient states at the moment he is not having chest pain. Patient does c/o SOB but denies nausea and diaphoresis.  Patient is also requesting a medication refill albuterol inhaler.

## 2019-10-04 NOTE — ED Provider Notes (Signed)
[facility] DEPT Provider Note   CSN: [id number] Arrival date & time: 10/04/19  1607     History Chief Complaint  Patient presents with  . Chest Pain  . Medication Refill    Robert [name] is a 39 y.o. male.  The history is provided by the patient. No language interpreter was used.  Chest Pain Pain location:  Substernal area Pain quality: aching, sharp and stabbing   Pain radiates to:  Does not radiate Pain severity:  Moderate Onset quality:  Gradual Duration:  1 day Timing:  Constant Progression:  Worsening Chronicity:  New Context: breathing   Relieved by:  Nothing Worsened by:  Nothing Ineffective treatments:  None tried Associated symptoms: shortness of breath   Risk factors: male sex   Medication Refill  Pt complains of shortness of breath and chest pain.  Pt reports he is out of albuterol     Past Medical History:  Diagnosis Date  . Asthma     Patient Active Problem List   Diagnosis Date Noted  . Chest pain 11/03/2013  . Tobacco abuse 11/03/2013    Past Surgical History:  Procedure Laterality Date  . WRIST SURGERY         Family History  Problem Relation Age of Onset  . Heart failure Father     Social History   Tobacco Use  . Smoking status: Current Every Day Smoker    Packs/day: 0.50    Types: Cigarettes  . Smokeless tobacco: Never Used  Substance Use Topics  . Alcohol use: Yes  . Drug use: Yes    Types: Marijuana    Home Medications Prior to Admission medications   Medication Sig Start Date End Date Taking? Authorizing Provider  omeprazole (PRILOSEC) 20 MG capsule Take 1 capsule (20 mg total) by mouth daily. 08/03/19 09/02/19  Darr, Marguerita Beards, PA-C  ondansetron (ZOFRAN) 4 MG tablet Take 1 tablet (4 mg total) by mouth every 8 (eight) hours as needed for nausea or vomiting. Patient not taking: Reported on 10/04/2019 08/03/19   Darr, Marguerita Beards, PA-C  sucralfate (CARAFATE) 1 GM/10ML suspension Take 10 mLs (1 g  total) by mouth 4 (four) times daily -  with meals and at bedtime. Patient not taking: Reported on 10/04/2019 08/03/19   Darr, Marguerita Beards, PA-C    Allergies    Patient has no known allergies.  Review of Systems   Review of Systems  Respiratory: Positive for shortness of breath.   Cardiovascular: Positive for chest pain.  All other systems reviewed and are negative.   Physical Exam Updated Vital Signs BP (!) 122/95   Pulse (!) 120   Temp 98.7 F (37.1 C) (Oral)   Resp (!) 22   Ht 5\' 10"  (1.778 m)   Wt 81.6 kg   SpO2 97%   BMI 25.83 kg/m   Physical Exam Vitals and nursing note reviewed.  Constitutional:      Appearance: He is well-developed.  HENT:     Head: Normocephalic.  Eyes:     Pupils: Pupils are equal, round, and reactive to light.  Cardiovascular:     Rate and Rhythm: Tachycardia present.     Heart sounds: Normal heart sounds.  Pulmonary:     Effort: Pulmonary effort is normal.     Breath sounds: Normal breath sounds.  Abdominal:     General: Bowel sounds are normal.     Palpations: Abdomen is soft.  Musculoskeletal:        General:  Normal range of motion.     Cervical back: Normal range of motion.  Skin:    General: Skin is warm.  Neurological:     General: No focal deficit present.     Mental Status: He is alert.     ED Results / Procedures / Treatments   Labs (all labs ordered are listed, but only abnormal results are displayed) Labs Reviewed  BASIC METABOLIC PANEL - Abnormal; Notable for the following components:      Result Value   CO2 20 (*)    Glucose, Bld 120 (*)    Calcium 8.3 (*)    All other components within normal limits  BRAIN NATRIURETIC PEPTIDE - Abnormal; Notable for the following components:   B Natriuretic Peptide 2,614.9 (*)    All other components within normal limits  TROPONIN I (HIGH SENSITIVITY) - Abnormal; Notable for the following components:   Troponin I (High Sensitivity) 22 (*)    All other components within normal  limits  TROPONIN I (HIGH SENSITIVITY) - Abnormal; Notable for the following components:   Troponin I (High Sensitivity) 23 (*)    All other components within normal limits  CBC    EKG EKG Interpretation  Date/Time:  Monday Oct 04 2019 13:01:32 EDT Ventricular Rate:  117 PR Interval:    QRS Duration: 96 QT Interval:  379 QTC Calculation: 529 R Axis:   61 Text Interpretation: Sinus tachycardia LAE, consider biatrial enlargement Abnormal T, consider ischemia, diffuse leads Prolonged QT interval Confirmed by Lacretia Leigh ([id number]) on 10/04/2019 1:11:19 PM   Radiology DG Chest 2 View  Result Date: 10/04/2019 CLINICAL DATA:  Shortness of breath with intermittent chest pain EXAM: CHEST - 2 VIEW COMPARISON:  August 03, 2018 FINDINGS: There is cardiomegaly with pulmonary venous hypertension. There is mild atelectatic change in each mid lung. There is no frank edema or airspace opacity. No pleural effusions evident. No adenopathy. No bone lesions. IMPRESSION: Cardiomegaly with pulmonary vascular congestion. Mild midlung atelectatic change. Lungs otherwise clear. No evident adenopathy. Electronically Signed   By: Lowella Grip III M.D.   On: 10/04/2019 10:21    Procedures Procedures (including critical care time)  Medications Ordered in ED Medications  sodium chloride flush (NS) 0.9 % injection 3 mL (3 mLs Intravenous Not Given 10/04/19 1054)  furosemide (LASIX) injection 40 mg (40 mg Intravenous Given 10/04/19 1138)    ED Course  I have reviewed the triage vital signs and the nursing notes.  Pertinent labs & imaging results that were available during my care of the patient were reviewed by me and considered in my medical decision making (see chart for details).    MDM Rules/Calculators/A&P                      MDM:   Pt has abnormal ekg with tachycardia and nonspecific changes,  Chest xray shows cardiomegally and pulmonary vascular congestion. Pt has troponin of 20. BNP 2614.   I  spoke to Dr. Oval Linsey with Cardiology who will see,  She ask to have Hospitalist admit and cardiology will see for consultation  Final Clinical Impression(s) / ED Diagnoses Final diagnoses:  Acute congestive heart failure, unspecified heart failure type [facility])    Rx / DC Orders ED Discharge Orders    None       Sidney Ace 10/04/19 1351    Veryl Speak, MD 10/04/19 1421

## 2019-10-04 NOTE — ED Notes (Signed)
Provider at pt bedside  

## 2019-10-04 NOTE — Consult Note (Signed)
.  Cardiology Consultation:   Patient ID: Robert [name] MRN: [id number]; DOB: 1981-04-28  Admit date: 10/04/2019 Date of Consult: 10/04/2019  Primary Care Provider: Patient, No Pcp Per Primary Cardiologist: No primary care provider on file.  Primary Electrophysiologist:  None    Patient Profile:   Robert [name] is a 39 y.o. male with a hx of  who is being seen today for the evaluation of asthma palpitation and chest pain at the request of Caryl Ada, PA-C.  History of Present Illness:   Robert [name] reports increasing shortness of breath over the last 2 to 3 weeks.  He has also noted lower extremity edema, orthopnea, and PND.  He has intermittent tachycardia that last for hours at a time.  More recently he is also noted some chest heaviness.  It is not necessarily with exertion and seems to occur randomly.  He does not get much formal exercise.  He does work lifting tires daily and typically has no exertional chest pain or shortness of breath.  He has a history of asthma but has not had his inhaler for the last 2 years.  1 or 2 weeks prior to the onset of the symptoms he had cold-like symptoms and a productive cough.  He reports drinking 2 many bottles of alcohol daily.  He occasionally uses cocaine.  The most recent was 5 days ago.  In the ED he was noted to have sinus tachycardia.  High-sensitivity troponin was elevated to 23 and flat.  Chest x-ray was notable for cardiomegaly and pulmonary vascular congestion.  BNP was 2614.  EKG showed lateral T wave inversions.  Of note, he has inferolateral T wave inversions on his EKG at baseline.  Cardiology was consulted for the above symptoms.  He received IV Lasix and has 2-1/2 urinals full at the bedside.  He is starting to feel little bit better.  He thinks his edema is unchanged.  Past Medical History:  Diagnosis Date  . Asthma     Past Surgical History:  Procedure Laterality Date  . WRIST SURGERY       Home Medications:  Prior to Admission  medications   Medication Sig Start Date End Date Taking? Authorizing Provider  omeprazole (PRILOSEC) 20 MG capsule Take 1 capsule (20 mg total) by mouth daily. 08/03/19 09/02/19  Darr, Marguerita Beards, PA-C  ondansetron (ZOFRAN) 4 MG tablet Take 1 tablet (4 mg total) by mouth every 8 (eight) hours as needed for nausea or vomiting. Patient not taking: Reported on 10/04/2019 08/03/19   Darr, Marguerita Beards, PA-C  sucralfate (CARAFATE) 1 GM/10ML suspension Take 10 mLs (1 g total) by mouth 4 (four) times daily -  with meals and at bedtime. Patient not taking: Reported on 10/04/2019 08/03/19   Darr, Marguerita Beards, PA-C    Inpatient Medications: Scheduled Meds: . sodium chloride flush  3 mL Intravenous Once   Continuous Infusions:  PRN Meds:   Allergies:   No Known Allergies  Social History:   Social History   Socioeconomic History  . Marital status: Single    Spouse name: Not on file  . Number of children: Not on file  . Years of education: Not on file  . Highest education level: Not on file  Occupational History  . Not on file  Tobacco Use  . Smoking status: Current Every Day Smoker    Packs/day: 0.50    Types: Cigarettes  . Smokeless tobacco: Never Used  Substance and Sexual Activity  . Alcohol use: Yes  .  Drug use: Yes    Types: Marijuana  . Sexual activity: Not on file  Other Topics Concern  . Not on file  Social History Narrative  . Not on file   Social Determinants of Health   Financial Resource Strain:   . Difficulty of Paying Living Expenses:   Food Insecurity:   . Worried About Charity fundraiser in the Last Year:   . Arboriculturist in the Last Year:   Transportation Needs:   . Film/video editor (Medical):   [name] Lack of Transportation (Non-Medical):   Physical Activity:   . Days of Exercise per Week:   . Minutes of Exercise per Session:   Stress:   . Feeling of Stress :   Social Connections:   . Frequency of Communication with Friends and Family:   . Frequency of Social  Gatherings with Friends and Family:   . Attends Religious Services:   . Active Member of Clubs or Organizations:   . Attends Archivist Meetings:   [name] Marital Status:   Intimate Partner Violence:   . Fear of Current or Ex-Partner:   . Emotionally Abused:   [name] Physically Abused:   . Sexually Abused:     Family History:    Family History  Problem Relation Age of Onset  . Heart failure Father      ROS:  Please see the history of present illness.  All other ROS reviewed and negative.     Physical Exam/Data:   Vitals:   10/04/19 1333 10/04/19 1400 10/04/19 1415 10/04/19 1430  BP: 92/71 (!) 118/98 (!) 123/97 (!) 116/100  Pulse: (!) 115 (!) 116 (!) 119 (!) 110  Resp: (!) 26 (!) 33 (!) 25 20  Temp:      TempSrc:      SpO2: 98% 98% 97% 99%  Weight:      Height:       No intake or output data in the 24 hours ending 10/04/19 1455 Last 3 Weights 10/04/2019 02/05/2019 08/03/2018  Weight (lbs) 180 lb 188 lb 190 lb  Weight (kg) 81.647 kg 85.276 kg 86.183 kg     VS:  BP (!) 124/98   Pulse (!) 107   Temp 98.7 F (37.1 C) (Oral)   Resp 18   Ht 5\' 10"  (1.778 m)   Wt 81.6 kg   SpO2 96%   BMI 25.83 kg/m  , BMI Body mass index is 25.83 kg/m. GENERAL:  Well appearing.  No acute distress HEENT: Pupils equal round and reactive, fundi not visualized, oral mucosa unremarkable NECK:  + jugular venous distention sitting upright, waveform within normal limits, carotid upstroke brisk and symmetric, no bruits, no thyromegaly LYMPHATICS:  No cervical adenopathy LUNGS:  Clear to auscultation bilaterally HEART:  RRR.  PMI not displaced or sustained,S1 and S2 within normal limits, no S3, no S4, no clicks, no rubs, no murmurs ABD:  Flat, positive bowel sounds normal in frequency in pitch, no bruits, no rebound, no guarding, no midline pulsatile mass, no hepatomegaly, no splenomegaly EXT:  2 plus pulses throughout, 2+ pitting edema to the ankles bilaterally, no cyanosis no clubbing SKIN:   No rashes no nodules NEURO:  Cranial nerves II through XII grossly intact, motor grossly intact throughout PSYCH:  Cognitively intact, oriented to person place and time  EKG:  The EKG was personally reviewed and demonstrates:  Sinus tachycardia.  Rate 117 bpm.  Biatrial enlargement.  Lateral T wave inversions.  QTC 529  ms. Telemetry:  Telemetry was personally reviewed and demonstrates: Sinus tachycardia.  Occasional PVCs.  Relevant CV Studies: Echo pending  Laboratory Data:  High Sensitivity Troponin:   Recent Labs  Lab 10/04/19 1041 10/04/19 1205  TROPONINIHS 22* 23*     Chemistry Recent Labs  Lab 10/04/19 1041  NA 138  K 3.5  CL 107  CO2 20*  GLUCOSE 120*  BUN 19  CREATININE 0.96  CALCIUM 8.3*  GFRNONAA >60  GFRAA >60  ANIONGAP 11    No results for input(s): PROT, ALBUMIN, AST, ALT, ALKPHOS, BILITOT in the last 168 hours. Hematology Recent Labs  Lab 10/04/19 1041  WBC 5.2  RBC 4.45  HGB 13.3  HCT 41.2  MCV 92.6  MCH 29.9  MCHC 32.3  RDW 12.9  PLT 286   BNP Recent Labs  Lab 10/04/19 1041  BNP 2,614.9*    DDimer No results for input(s): DDIMER in the last 168 hours.   Radiology/Studies:  DG Chest 2 View  Result Date: 10/04/2019 CLINICAL DATA:  Shortness of breath with intermittent chest pain EXAM: CHEST - 2 VIEW COMPARISON:  August 03, 2018 FINDINGS: There is cardiomegaly with pulmonary venous hypertension. There is mild atelectatic change in each mid lung. There is no frank edema or airspace opacity. No pleural effusions evident. No adenopathy. No bone lesions. IMPRESSION: Cardiomegaly with pulmonary vascular congestion. Mild midlung atelectatic change. Lungs otherwise clear. No evident adenopathy. Electronically Signed   By: Lowella Grip III M.D.   On: 10/04/2019 10:21     Assessment and Plan:   # Chest pain:  #Demand ischemia: #Cardiomegaly: #Palpitations: Mr. Maniscalco has new onset heart failure.  There is cardiomegaly on chest x-ray.  We  will get an echocardiogram to better assess.  He is also volume overloaded.  He is feeling better with diuresis.  Continue with Lasix 40 mg IV twice daily.  He also has sinus tachycardia.  We will check a TSH.  He is not anemic.  He has not used his albuterol recently.  He reports cocaine abuse, though none recently.  We will check a urinalysis.  He has a history of alcohol use, but reports only having 2 drinks for 5 days/week.  Post viral cardiomyopathy is also a possibility.  Given the cocaine use, would avoid metoprolol.  Could consider carvedilol.  Consider an ischemia evaluation based on echo findings.  However I suspect this is not ischemia related.  High-sensitivity troponin is minimally elevated and flat.  This is more consistent with demand ischemia.  # Substance Abuse:  He smokes 4 to 5 cigarettes daily and has intermittent cocaine use.  He reports drinking 2 drinks several times per week.  Check urinalysis as above.  Social work consult needed.     For questions or updates, please contact [place] Please consult [email or web address] for contact info under     Signed, Skeet Latch, MD  10/04/2019 2:55 PM

## 2019-10-04 NOTE — Consult Note (Signed)
   Chief Complaint: Submassive PE  Referring Physician(s): Dr. Collier Bullock  Patient Status: [facility] - ED  Curbside Consultation:  I have reviewed the CTA chest and this appears to be primarily a cardiac issue.  The PE is tiny and non occlusive, in fact it is fairly wispy suggesting that it may even be chronic in nature.  However, the cardiomegally and pericardial effusion are pronounced and there are small bilateral effusions as well.  I am more concerned for a cardiomyopathy or myocarditis than I am for the PE.    - No indication for thrombolysis/thrombectomy - Recommend cardiology consultation - Bilateral lower extremity venous duplex US to assess for any additional DVT.    Electronically Signed: Jacqulynn Cadet, MD 10/04/2019, 5:06 PM

## 2019-10-04 NOTE — Progress Notes (Signed)
ANTICOAGULATION CONSULT NOTE - Initial Consult  Pharmacy Consult for heparin Indication: pulmonary embolus  No Known Allergies  Patient Measurements: Height: 5\' 10"  (177.8 cm) Weight: 81.6 kg (180 lb) IBW/kg (Calculated) : 73 Heparin Dosing Weight: 81.6kg  Vital Signs: Temp: 98.7 F (37.1 C) (05/24 0955) Temp Source: Oral (05/24 0955) BP: 124/98 (05/24 1500) Pulse Rate: 107 (05/24 1500)  Labs: Recent Labs    10/04/19 1041 10/04/19 1205  HGB 13.3  --   HCT 41.2  --   PLT 286  --   CREATININE 0.96  --   TROPONINIHS 22* 23*    Estimated Creatinine Clearance: 107.7 mL/min (by C-G formula based on SCr of 0.96 mg/dL).   Medical History: Past Medical History:  Diagnosis Date  . Asthma      Assessment: 39 yo male present for evaluation of chest pain and SOB.  Patient had a CTA of the chest which showed acute nonocclusive lobar and segmental pulmonary emboli in the left lower lobe.  Pharmacy consulted to dose heparin drip.  No prior AC noted.  10/04/2019 H/H WNL Plts WNL CrCL > 156mls/min APTT and pt/INR ordered STAT  Goal of Therapy:  Heparin level 0.3-0.7 units/ml Monitor platelets by anticoagulation protocol   Plan:  Heparin bolus 4000 units x 1 Then start heparin drip at 1300 units/hr Heparin level in 6 hours Daily CBC  Dolly Rias RPh 10/04/2019, 4:40 PM

## 2019-10-04 NOTE — ED Notes (Signed)
Pt removed BP cuff, replaced back on pt.

## 2019-10-05 ENCOUNTER — Inpatient Hospital Stay (HOSPITAL_COMMUNITY): Payer: BC Managed Care – PPO

## 2019-10-05 ENCOUNTER — Inpatient Hospital Stay (HOSPITAL_COMMUNITY)
Admit: 2019-10-05 | Discharge: 2019-10-05 | Disposition: A | Discharge status: Home / Self Care | Payer: BC Managed Care – PPO | Attending: Internal Medicine | Admitting: Internal Medicine

## 2019-10-05 ENCOUNTER — Other Ambulatory Visit: Payer: Self-pay

## 2019-10-05 DIAGNOSIS — I2602 Saddle embolus of pulmonary artery with acute cor pulmonale: Secondary | ICD-10-CM

## 2019-10-05 DIAGNOSIS — I2699 Other pulmonary embolism without acute cor pulmonale: Secondary | ICD-10-CM

## 2019-10-05 DIAGNOSIS — I509 Heart failure, unspecified: Secondary | ICD-10-CM

## 2019-10-05 DIAGNOSIS — I340 Nonrheumatic mitral (valve) insufficiency: Secondary | ICD-10-CM

## 2019-10-05 DIAGNOSIS — I5041 Acute combined systolic (congestive) and diastolic (congestive) heart failure: Secondary | ICD-10-CM

## 2019-10-05 LAB — HEPARIN LEVEL (UNFRACTIONATED)
Heparin Unfractionated: 0.63 IU/mL (ref 0.30–0.70)
Heparin Unfractionated: 0.43 IU/mL (ref 0.30–0.70)

## 2019-10-05 LAB — ECHOCARDIOGRAM COMPLETE
Height: 70 in
Weight: 2880 oz

## 2019-10-05 LAB — MAGNESIUM: Magnesium: 1.5 mg/dL — ABNORMAL LOW (ref 1.7–2.4)

## 2019-10-05 LAB — BASIC METABOLIC PANEL
Anion gap: 8 (ref 5–15)
BUN: 19 mg/dL (ref 6–20)
CO2: 28 mmol/L (ref 22–32)
Calcium: 8.7 mg/dL — ABNORMAL LOW (ref 8.9–10.3)
Chloride: 102 mmol/L (ref 98–111)
Creatinine, Ser: 1.27 mg/dL — ABNORMAL HIGH (ref 0.61–1.24)
GFR calc Af Amer: >60 mL/min (ref >60)
GFR calc non Af Amer: >60 mL/min (ref >60)
Glucose, Bld: 94 mg/dL (ref 70–99)
Potassium: 3.7 mmol/L (ref 3.5–5.1)
Sodium: 138 mmol/L (ref 135–145)

## 2019-10-05 LAB — CBC
HCT: 45.2 % (ref 39.0–52.0)
Hemoglobin: 14.6 g/dL (ref 13.0–17.0)
MCH: 29.7 pg (ref 26.0–34.0)
MCHC: 32.3 g/dL (ref 30.0–36.0)
MCV: 92.1 fL (ref 80.0–100.0)
Platelets: 303 10*3/uL (ref 150–400)
RBC: 4.91 MIL/uL (ref 4.22–5.81)
RDW: 12.9 % (ref 11.5–15.5)
WBC: 5.7 10*3/uL (ref 4.0–10.5)
nRBC: 0 % (ref 0.0–0.2)

## 2019-10-05 LAB — PHOSPHORUS: Phosphorus: 4.1 mg/dL (ref 2.5–4.6)

## 2019-10-05 MED ORDER — THIAMINE HCL 100 MG PO TABS
100.0000 mg | ORAL_TABLET | Freq: Every day | ORAL | Status: DC
  Administered 2019-10-05 – 2019-10-13 (×9): 100 mg via ORAL
  Filled 2019-10-05 (×9): qty 1

## 2019-10-05 MED ORDER — LORAZEPAM 1 MG PO TABS
1.0000 mg | ORAL_TABLET | ORAL | Status: AC | PRN
  Administered 2019-10-05: 1 mg via ORAL
  Filled 2019-10-05: qty 1

## 2019-10-05 MED ORDER — THIAMINE HCL 100 MG/ML IJ SOLN: 100.0000 mg | Freq: Every day | INTRAMUSCULAR | Status: DC

## 2019-10-05 MED ORDER — ADULT MULTIVITAMIN W/MINERALS CH
1.0000 | ORAL_TABLET | Freq: Every day | ORAL | Status: DC
  Administered 2019-10-05 – 2019-10-13 (×9): 1 via ORAL
  Filled 2019-10-05 (×9): qty 1

## 2019-10-05 MED ORDER — LORAZEPAM 2 MG/ML IJ SOLN
1.0000 mg | INTRAMUSCULAR | Status: AC | PRN
  Administered 2019-10-07: 2 mg via INTRAVENOUS
  Filled 2019-10-05: qty 1

## 2019-10-05 MED ORDER — FOLIC ACID 1 MG PO TABS
1.0000 mg | ORAL_TABLET | Freq: Every day | ORAL | Status: DC
  Administered 2019-10-05 – 2019-10-13 (×9): 1 mg via ORAL
  Filled 2019-10-05 (×9): qty 1

## 2019-10-05 MED ORDER — DIGOXIN 125 MCG PO TABS
0.2500 mg | ORAL_TABLET | Freq: Every day | ORAL | Status: DC
  Administered 2019-10-05 – 2019-10-07 (×3): 0.25 mg via ORAL
  Filled 2019-10-05: qty 2
  Filled 2019-10-05: qty 1
  Filled 2019-10-05: qty 2

## 2019-10-05 MED ORDER — MAGNESIUM SULFATE 4 GM/100ML IV SOLN
4.0000 g | Freq: Once | INTRAVENOUS | Status: AC
  Administered 2019-10-05: 4 g via INTRAVENOUS
  Filled 2019-10-05: qty 100

## 2019-10-05 NOTE — Progress Notes (Signed)
  Echocardiogram 2D Echocardiogram has been performed.  Robert [name] 10/05/2019, 9:22 AM

## 2019-10-05 NOTE — ED Notes (Signed)
Pt given a few ice chips.

## 2019-10-05 NOTE — Progress Notes (Signed)
Progress Note  Patient Name: Robert [name] Date of Encounter: 10/05/2019  Primary Cardiologist: No primary care provider on file.   Subjective   Patient put out 7L urine overnight. Creatinine up this AM. He feels overall better but dies have some intermittent chest discomfort.   Echo showed EF <20% with G3DD.  Inpatient Medications    Scheduled Meds: . furosemide  20 mg Intravenous BID  . furosemide  40 mg Intravenous BID  . sodium chloride flush  3 mL Intravenous Once  . sodium chloride flush  3 mL Intravenous Q12H   Continuous Infusions: . sodium chloride    . heparin 1,300 Units/hr (10/04/19 1841)   PRN Meds: sodium chloride, iohexol, ondansetron **OR** ondansetron (ZOFRAN) IV, sodium chloride flush   Vital Signs    Vitals:   10/05/19 0830 10/05/19 0900 10/05/19 0915 10/05/19 0930  BP: 110/81 107/82  104/79  Pulse: (!) 107 (!) 110 (!) 103 94  Resp: (!) 23 (!) 21 20 16   Temp:      TempSrc:      SpO2: 96% 97% 98% 99%  Weight:      Height:        Intake/Output Summary (Last 24 hours) at 10/05/2019 0947 Last data filed at 10/05/2019 0431 Gross per 24 hour  Intake 355 ml  Output 7010 ml  Net -6655 ml   Last 3 Weights 10/04/2019 02/05/2019 08/03/2018  Weight (lbs) 180 lb 188 lb 190 lb  Weight (kg) 81.647 kg 85.276 kg 86.183 kg      Telemetry    Sinus tach 100-110, pak to 120s, PVCs - Personally Reviewed  ECG    No new - Personally Reviewed  Physical Exam   GEN: No acute distress.   Neck: No JVD Cardiac: RR, tachycardic, no murmurs, rubs, or gallops.  Respiratory: minimal crackles LLL GI: Soft, nontender, non-distended  MS: No edema; No deformity. Neuro:  Nonfocal  Psych: Normal affect   Labs    High Sensitivity Troponin:   Recent Labs  Lab 10/04/19 1041 10/04/19 1205  TROPONINIHS 22* 23*      Chemistry Recent Labs  Lab 10/04/19 1041 10/05/19 0509  NA 138 138  K 3.5 3.7  CL 107 102  CO2 20* 28  GLUCOSE 120* 94  BUN 19 19   CREATININE 0.96 1.27*  CALCIUM 8.3* 8.7*  GFRNONAA >60 >60  GFRAA >60 >60  ANIONGAP 11 8     Hematology Recent Labs  Lab 10/04/19 1041 10/05/19 0509  WBC 5.2 5.7  RBC 4.45 4.91  HGB 13.3 14.6  HCT 41.2 45.2  MCV 92.6 92.1  MCH 29.9 29.7  MCHC 32.3 32.3  RDW 12.9 12.9  PLT 286 303    BNP Recent Labs  Lab 10/04/19 1041  BNP 2,614.9*     DDimer No results for input(s): DDIMER in the last 168 hours.   Radiology    DG Chest 2 View  Result Date: 10/04/2019 CLINICAL DATA:  Shortness of breath with intermittent chest pain EXAM: CHEST - 2 VIEW COMPARISON:  August 03, 2018 FINDINGS: There is cardiomegaly with pulmonary venous hypertension. There is mild atelectatic change in each mid lung. There is no frank edema or airspace opacity. No pleural effusions evident. No adenopathy. No bone lesions. IMPRESSION: Cardiomegaly with pulmonary vascular congestion. Mild midlung atelectatic change. Lungs otherwise clear. No evident adenopathy. Electronically Signed   By: Lowella Grip III M.D.   On: 10/04/2019 10:21   CT ANGIO CHEST PE W OR WO CONTRAST  Result Date: 10/04/2019 CLINICAL DATA:  Intermittent chest pain and palpitations for the past week. EXAM: CT ANGIOGRAPHY CHEST WITH CONTRAST TECHNIQUE: Multidetector CT imaging of the chest was performed using the standard protocol during bolus administration of intravenous contrast. Multiplanar CT image reconstructions and MIPs were obtained to evaluate the vascular anatomy. CONTRAST:  161mL OMNIPAQUE IOHEXOL 350 MG/ML SOLN COMPARISON:  Chest x-ray from same day. FINDINGS: Cardiovascular: Satisfactory opacification of the pulmonary arteries to the segmental level. Acute nonocclusive lobar and segmental pulmonary emboli in the left lower lobe. No additional pulmonary emboli. Moderate cardiomegaly. Elevated RV/LV ratio measuring 1.1. Small to moderate pericardial effusion. No thoracic aortic aneurysm. Reflux of contrast into the hepatic veins.  Mediastinum/Nodes: Prominent subcentimeter mediastinal and hilar lymph nodes are likely reactive. No enlarged axillary lymph nodes. The thyroid gland, trachea, and esophagus demonstrate no significant findings. Lungs/Pleura: Small right and trace left pleural effusions. Scattered mild interlobular septal thickening. Subsegmental atelectasis in both lower lobes with more focal patchy opacity and small pneumatocele in the medial aspect of the left lower lobe. No pneumothorax. Upper Abdomen: No acute abnormality. Musculoskeletal: No chest wall abnormality. No acute or significant osseous findings. Review of the MIP images confirms the above findings. IMPRESSION: 1. Acute nonocclusive lobar and segmental pulmonary emboli in the left lower lobe. Positive for acute PE with CT evidence of right heart strain (RV/LV Ratio = 1.1) consistent with at least submassive (intermediate risk) PE. The presence of right heart strain has been associated with an increased risk of morbidity and mortality. 2. Moderate cardiomegaly with small to moderate pericardial effusion, small right and trace left pleural effusions, and mild interstitial pulmonary edema, consistent with congestive heart failure. 3. Subsegmental atelectasis in both lower lobes with more focal patchy opacity and small pneumatocele in the medial aspect of the left lower lobe that may be postinfectious or postinflammatory scarring. Given the patient's smoking history, follow-up chest CT in 3 months is recommended. Critical Value/emergent results were called by telephone at the time of interpretation on 10/04/2019 at 4:08 pm to provider [facility] , who verbally acknowledged these results. Electronically Signed   By: Titus Dubin M.D.   On: 10/04/2019 16:12    Cardiac Studies   Echo   1. Left ventricular ejection fraction, by estimation, is <20%. The left  ventricle has severely decreased function. The left ventricle demonstrates  global hypokinesis. The  left ventricular internal cavity size was  moderately to severely dilated.  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 Left ventricular diastolic parameters are  consistent with Grade III diastolic dysfunction (restrictive). Elevated  left ventricular end-diastolic pressure.  2. Right ventricular systolic function is moderately reduced. The right  ventricular size is mildly enlarged. There is mildly elevated pulmonary  artery systolic pressure. The estimated right ventricular systolic  pressure is 60.7 mmHg.  3. Left atrial size was moderately dilated.  4. Right atrial size was mildly dilated.  5. The mitral valve is normal in structure. Mild mitral valve  regurgitation. No evidence of mitral stenosis.  6. The aortic valve is tricuspid. Aortic valve regurgitation is not  visualized. No aortic stenosis is present.  7. The inferior vena cava is dilated in size with <50% respiratory  variability, suggesting right atrial pressure of 15 mmHg.   Patient Profile     39 y.o. male with pmh of asthma and family history of CAD who presented with dyspnea and palpitations found to have new onset HF.  Assessment & Plan    Acute PE - presented with chest pain and sob on exertion with tachycardic - CTA chest showed submassive PE with right heart strain - IV heparin per pharmacy - 2D echo showed severely decreased EF  - IR consulted and no plan for thrombolysis or thrombectomy - US dopplers LE ordered  New acute CHF - CXR showed cardiomegaly and pulmonary vascular congestion. BNP up to 2614 - CTA chest showed small tom ed pericardial effusion and acute non-occlusive segmental PE in the LLL consistent with submassive PE and findings consistent with CHF - Unsure etiology, patient has history of cocaine abuse and alcohol use. UDS pending - Echo showed EF <20%, global hypokinesis, LV cavity is mod to severely dilated, prominent  trabeculation (consider cardiac MRI), G3DD (restrictive), elevated  - No metoprolol due to h/o of cocaine use. Can consider coreg - started on IV lasix 40 mg BID but also received some extra doses of lasix - Patient put out 7L urine overnight - creatinine 1.27. Hold lasix today with elevated creatinine.  - Patient will need further work-up to investigate low EF. Low suspicion of ischemic etiology. Will discuss plan with MD.   Elevated troponin/demand ischemia - in the setting of Acute PE and CHF - HS troponin 22>23 - Patient did have some chest pain but this is also in the setting of PE  Palpitations - TSH 1.85 - tele shows sinus tach with PVCs  For questions or updates, please contact [facility] Please consult [email or web address] for contact info under        Signed, Kathryn Cosby Ninfa Meeker, PA-C  10/05/2019, 9:47 AM

## 2019-10-05 NOTE — Progress Notes (Signed)
   10/05/19 1946  Assess: MEWS Score  Temp 97.7 F (36.5 C)  BP 96/67  Pulse Rate (!) 113  Resp 20  Level of Consciousness Alert  SpO2 100 %  O2 Device Room Air  Patient Activity (if Appropriate) In bed  Assess: MEWS Score  MEWS Temp 0  MEWS Systolic 1  MEWS Pulse 2  MEWS RR 0  MEWS LOC 0  MEWS Score 3  MEWS Score Color Yellow  Assess: if the MEWS score is Yellow or Red  Were vital signs taken at a resting state? Yes  Focused Assessment Documented focused assessment  Early Detection of Sepsis Score *See Row Information* Low  MEWS guidelines implemented *See Row Information* No, previously yellow, continue vital signs every 4 hours  Treat  MEWS Interventions Other (Comment) (Conitnue to monitor the patient. BP to low to administer PRN)  Take Vital Signs  Increase Vital Sign Frequency  Yellow: Q 2hr X 2 then Q 4hr X 2, if remains yellow, continue Q 4hrs  Escalate  MEWS: Escalate Yellow: discuss with charge nurse/RN and consider discussing with provider and RRT  Notify: Charge Nurse/RN  Name of Charge Nurse/RN Notified [place], RN   Date Charge Nurse/RN Notified 10/05/19  Time Charge Nurse/RN Notified 1950  Document  Patient Outcome Other (Comment) (Patient stable )  Progress note created (see row info) Yes

## 2019-10-05 NOTE — ED Notes (Signed)
Pt educated on delay, diet, and need for 2nd IV.  Offered to assist patient into a gown, but Pt refused.  No current needs.

## 2019-10-05 NOTE — Progress Notes (Signed)
ANTICOAGULATION CONSULT NOTE  Pharmacy Consult for heparin Indication: pulmonary embolus  No Known Allergies  Patient Measurements: Height: 5\' 10"  (177.8 cm) Weight: 81.6 kg (180 lb) IBW/kg (Calculated) : 73 Heparin Dosing Weight: 81.6kg  Vital Signs: BP: 102/80 (05/25 0313) Pulse Rate: 130 (05/25 0313)  Labs: Recent Labs    10/04/19 1041 10/04/19 1205 10/04/19 1711 10/05/19 0235  HGB 13.3  --   --   --   HCT 41.2  --   --   --   PLT 286  --   --   --   APTT  --   --  27  --   LABPROT  --   --  14.1  --   INR  --   --  1.1  --   HEPARINUNFRC  --   --   --  0.63  CREATININE 0.96  --   --   --   TROPONINIHS 22* 23*  --   --     Estimated Creatinine Clearance: 107.7 mL/min (by C-G formula based on SCr of 0.96 mg/dL).   Medical History: Past Medical History:  Diagnosis Date  . Asthma      Assessment: 39 yo male present for evaluation of chest pain and SOB.  Patient had a CTA of the chest which showed acute nonocclusive lobar and segmental pulmonary emboli in the left lower lobe.  Pharmacy consulted to dose heparin drip.  No prior AC noted.  10/05/2019 HL 0.63 wnl No bleeding reported  Goal of Therapy:  Heparin level 0.3-0.7 units/ml Monitor platelets by anticoagulation protocol   Plan:  Continue heparin infusion at 1300 units/hr Heparin level in 6 hours Daily CBC  Ulice Dash, PharmD, BCPS 10/05/2019, 3:42 AM

## 2019-10-05 NOTE — Progress Notes (Signed)
Venous duplex       has been completed. Preliminary results can be found under CV proc through chart review. Felicia Both, BS, RDMS, RVT   

## 2019-10-05 NOTE — ED Notes (Signed)
Pt getting antsy regarding wait/delay and restricted diet.  Pt given all updates available.

## 2019-10-05 NOTE — Progress Notes (Signed)
[place] for heparin Indication: pulmonary embolus  No Known Allergies  Patient Measurements: Height: 5\' 10"  (177.8 cm) Weight: 81.6 kg (180 lb) IBW/kg (Calculated) : 73 Heparin Dosing Weight: 81.6kg  Vital Signs: BP: 106/78 (05/25 1000) Pulse Rate: 111 (05/25 1000)  Labs: Recent Labs    10/04/19 1041 10/04/19 1205 10/04/19 1711 10/05/19 0235 10/05/19 0509 10/05/19 0940  HGB 13.3  --   --   --  14.6  --   HCT 41.2  --   --   --  45.2  --   PLT 286  --   --   --  303  --   APTT  --   --  27  --   --   --   LABPROT  --   --  14.1  --   --   --   INR  --   --  1.1  --   --   --   HEPARINUNFRC  --   --   --  0.63  --  0.43  CREATININE 0.96  --   --   --  1.27*  --   TROPONINIHS 22* 23*  --   --   --   --     Estimated Creatinine Clearance: 81.4 mL/min (A) (by C-G formula based on SCr of 1.27 mg/dL (H)).  Assessment: 39 yo male present for evaluation of chest pain and SOB.  Patient had a CTA of the chest which showed acute nonocclusive lobar and segmental pulmonary emboli in the left lower lobe.  Pharmacy consulted to dose heparin drip.  No prior AC noted.  Today, 10/05/2019:  CBC: stable WNL  Confirmatory heparin level therapeutic on 1300 units/hr  No bleeding or infusion issues per RN  Initially concerned for significant cardiac effects d/t PE, but per IR review, this is more likely cardiac in origin; recommending Cardiology workup  Goal of Therapy:  Heparin level 0.3-0.7 units/ml Monitor platelets by anticoagulation protocol   Plan:   Continue heparin infusion at 1300 units/hr  Daily heparin level and CBC  Monitor for signs of bleeding or worsening thrombosis  F/u plans for conversion to oral AC, any Cards recs  Reuel Boom, PharmD, BCPS [phone] 10/05/2019, 10:27 AM

## 2019-10-05 NOTE — ED Notes (Signed)
Pt continues to ask for food and drinks.  Pt given a few ice chips to wet mouth and educated on fluid restrictions.

## 2019-10-05 NOTE — Progress Notes (Signed)
PROGRESS NOTE  Robert [name] RFF:[id number] DOB: [birth year] DOA: 10/04/2019 PCP: Patient, No Pcp Per  HPI/Recap of past 24 hours: Robert [name] is a 39 y.o. male with medical history significant for asthma, alcohol and cocaine use, who presents to emergency room for evaluation of a 1 week history of shortness of breath, initially with exertion then at rest.    Associated with intermittent midsternal chest pain, nonradiating and occurs with exertion.  He does not have a family history of coronary artery disease and has never had symptoms like this in the past.  Due to the persistence of his symptoms he presented to the emergency room for further evaluation.  ED course: Twelve-lead EKG shows sinus tachycardia with biatrial enlargement. Labs reveal elevated BNP > 2600 and slight elevation of his troponin S, peaked at 23. Chest x-ray shows Cardiomegaly with pulmonary vascular congestion.  CTA of the chest showed acute nonocclusive lobar and segmental pulmonary emboli in the left lower lobe. Positive for acute PE with CT evidence of right heart strain (RV/LV Ratio = 1.1) consistent with at least submassive Moderate cardiomegaly with small to moderate pericardial effusion, small right and trace left pleural effusions, and mild interstitial pulmonary edema, consistent with congestive heart failure. Subsegmental atelectasis in both lower lobes with more focal patchy opacity and small pneumatocele in the medial aspect of the left lower lobe that may be postinfectious or postinflammatory scarring. Given the patient's smoking history, follow-up chest CT in 3 months is recommended.  Started on heparin drip.  Cardiology was consulted and evaluated the patient on admission.  Ongoing work up.  2D echo showed LVEF <20%, global hypokinesis, Grade 3DD.  Seen and evaluated by cardiology.  Plan is to transfer to [facility] for further management of his acute heart failure.  10/05/19: Seen and examined at  bedside at [facility] ED.  States his shortness of breath is improved with diuresing.  Admits to intermittent chest discomfort.  Tachycardic with HR 109-116.  BP stable, O2 sat 100% RA.  Net I&O -6.6L.  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 Net I&O -6.6 L on IV Lasix 40 mg twice daily Continue strict I's and O's and daily weights Evaluated by cardiology, will transfer to [facility] for further management under cardiology service Continue close monitoring on telemetry cardiac unit  Newly diagnosed acute pulmonary embolism Presented with chest pain and dyspnea CT angio positive for acute PE, segmental pulmonary emboli in the left lower lobe On heparin drip due to possible cardiac procedure O2 saturation stable 100% on room air  Acute pulmonary edema/bilateral pleural effusion likely cardiogenic Ongoing diuresing with improvement of symptomatology  AKI, possibly prerenal with ongoing diuresing Continue to closely monitor renal function, electrolytes and urine output Avoid nephrotoxins and hypotension as able Continue daily BMP for electrolytes and renal function monitoring  Sinus tachycardia, suspect multifactorial In the  setting of acute PE and acute heart failure Heart rate between 109 and 116 Defer further management to  cardiology  Subsegmental bibasilar atelectasis/small pneumatocele in the medial aspect of the left lower lobe may be postinfectious or postinflammatory scarring Follow-up chest CT in 3 months is recommended. May consider incentive spirometer or flutter valve if he becomes hypoxic O2 saturation currently 100% on room air  Hypomagnesemia Magnesium 1.5 Repleted intravenously  Cocaine and alcohol abuse UDS pending On CIWA protocol No evidence of withdrawal by the time of this visit Polysubstance cessation counseling done at bedside.  Tobacco use disorder Continue nicotine patch  DVT prophylaxis: Heparin drip Code Status: Full Family Communication:  None at bedside.  Disposition Plan:  Transferring to [facility]  Consults called: Interventional Radiology, cardiology   Status is: Inpatient    Dispo: The patient is from: Home.              Anticipated d/c is to: Home.              Anticipated d/c date is: 10/08/19               Patient currently ongoing further cardiac management for newly diagnosed acute combined diastolic and systolic heart failure and acute pulmonary embolism.          Objective: Vitals:   10/05/19 1000 10/05/19 1030 10/05/19 1045 10/05/19 1100  BP: 106/78 107/78  113/89  Pulse: (!) 111 (!) 104 (!) 116 (!) 112  Resp: 16   16  Temp:      TempSrc:      SpO2: 99% 98% 99% 100%  Weight:      Height:        Intake/Output Summary (Last 24 hours) at 10/05/2019 1158 Last data filed at 10/05/2019 0431 Gross per 24 hour  Intake 355 ml  Output 7010 ml  Net -6655 ml   Filed Weights   10/04/19 0959  Weight: 81.6 kg    Exam:  . General: 39 y.o. year-old male well developed well nourished in no acute distress.  Alert and oriented x3. . Cardiovascular: Tachycardic with no rubs or gallops.  No thyromegaly or JVD noted.   [name] Respiratory: Mild rales at bases no wheezing noted.  Good respiratory effort. . Abdomen: Soft nontender nondistended  with normal bowel sounds x4 quadrants. . Musculoskeletal: Trace lower extremity edema bilaterally. [name] Psychiatry: Mood is appropriate for condition and setting   Data Reviewed: CBC: Recent Labs  Lab 10/04/19 1041 10/05/19 0509  WBC 5.2 5.7  HGB 13.3 14.6  HCT 41.2 45.2  MCV 92.6 92.1  PLT 286 191   Basic Metabolic Panel: Recent Labs  Lab 10/04/19 1041 10/05/19 0509  NA 138 138  K 3.5 3.7  CL 107 102  CO2 20* 28  GLUCOSE 120* 94  BUN 19 19  CREATININE 0.96 1.27*  CALCIUM 8.3* 8.7*  MG  --  1.5*  PHOS  --  4.1   GFR: Estimated Creatinine Clearance: 81.4 mL/min (A) (by C-G formula based on SCr of 1.27 mg/dL (H)). Liver Function Tests: No results for input(s): AST, ALT, ALKPHOS, BILITOT, PROT, ALBUMIN in the last 168 hours. No results for input(s): LIPASE, AMYLASE in the last 168 hours. No results for input(s): AMMONIA in the last 168 hours. Coagulation Profile: Recent Labs  Lab 10/04/19 1711  INR 1.1   Cardiac Enzymes: No results for input(s): CKTOTAL, CKMB, CKMBINDEX, TROPONINI in the last 168 hours. BNP (last 3 results) No results for  input(s): PROBNP in the last 8760 hours. HbA1C: No results for input(s): HGBA1C in the last 72 hours. CBG: No results for input(s): GLUCAP in the last 168 hours. Lipid Profile: No results for input(s): CHOL, HDL, LDLCALC, TRIG, CHOLHDL, LDLDIRECT in the last 72 hours. Thyroid Function Tests: Recent Labs    10/04/19 1711  TSH 1.856   Anemia Panel: No results for input(s): VITAMINB12, FOLATE, FERRITIN, TIBC, IRON, RETICCTPCT in the last 72 hours. Urine analysis: No results found for: COLORURINE, APPEARANCEUR, LABSPEC, PHURINE, GLUCOSEU, HGBUR, BILIRUBINUR, KETONESUR, PROTEINUR, UROBILINOGEN, NITRITE, LEUKOCYTESUR Sepsis Labs: @LABRCNTIP (procalcitonin:4,lacticidven:4)  ) Recent Results (from the past 240 hour(s))  SARS Coronavirus 2 by RT PCR (hospital order, performed in [facility] hospital lab) Nasopharyngeal  Nasopharyngeal Swab     Status: None   Collection Time: 10/04/19  2:17 PM   Specimen: Nasopharyngeal Swab  Result Value Ref Range Status   SARS Coronavirus 2 NEGATIVE NEGATIVE Final    Comment: (NOTE) SARS-CoV-2 target nucleic acids are NOT DETECTED. The SARS-CoV-2 RNA is generally detectable in upper and lower respiratory specimens during the acute phase of infection. The lowest concentration of SARS-CoV-2 viral copies this assay can detect is 250 copies / mL. A negative result does not preclude SARS-CoV-2 infection and should not be used as the sole basis for treatment or other patient management decisions.  A negative result may occur with improper specimen collection / handling, submission of specimen other than nasopharyngeal swab, presence of viral mutation(s) within the areas targeted by this assay, and inadequate number of viral copies (<250 copies / mL). A negative result must be combined with clinical observations, patient history, and epidemiological information. Fact Sheet for Patients:   StrictlyIdeas.no Fact Sheet for Healthcare Providers: BankingDealers.co.za This test is not yet approved or cleared  by the [place] FDA and has been authorized for detection and/or diagnosis of SARS-CoV-2 by FDA under an Emergency Use Authorization (EUA).  This EUA will remain in effect (meaning this test can be used) for the duration of the COVID-19 declaration under Section 564(b)(1) of the Act, 21 U.S.C. section 360bbb-3(b)(1), unless the authorization is terminated or revoked sooner. Performed at [facility], [place] [address] [place], [place] [address]       Studies: CT ANGIO CHEST PE W OR WO CONTRAST  Result Date: 10/04/2019 CLINICAL DATA:  Intermittent chest pain and palpitations for the past week. EXAM: CT ANGIOGRAPHY CHEST WITH CONTRAST TECHNIQUE: Multidetector CT imaging of the chest was performed using  the standard protocol during bolus administration of intravenous contrast. Multiplanar CT image reconstructions and MIPs were obtained to evaluate the vascular anatomy. CONTRAST:  161mL OMNIPAQUE IOHEXOL 350 MG/ML SOLN COMPARISON:  Chest x-ray from same day. FINDINGS: Cardiovascular: Satisfactory opacification of the pulmonary arteries to the segmental level. Acute nonocclusive lobar and segmental pulmonary emboli in the left lower lobe. No additional pulmonary emboli. Moderate cardiomegaly. Elevated RV/LV ratio measuring 1.1. Small to moderate pericardial effusion. No thoracic aortic aneurysm. Reflux of contrast into the hepatic veins. Mediastinum/Nodes: Prominent subcentimeter mediastinal and hilar lymph nodes are likely reactive. No enlarged axillary lymph nodes. The thyroid gland, trachea, and esophagus demonstrate no significant findings. Lungs/Pleura: Small right and trace left pleural effusions. Scattered mild interlobular septal thickening. Subsegmental atelectasis in both lower lobes with more focal patchy opacity and small pneumatocele in the medial aspect of the left lower lobe. No pneumothorax. Upper Abdomen: No acute abnormality. Musculoskeletal: No chest wall abnormality. No acute or significant osseous findings. Review of the MIP  images confirms the above findings. IMPRESSION: 1. Acute nonocclusive lobar and segmental pulmonary emboli in the left lower lobe. Positive for acute PE with CT evidence of right heart strain (RV/LV Ratio = 1.1) consistent with at least submassive (intermediate risk) PE. The presence of right heart strain has been associated with an increased risk of morbidity and mortality. 2. Moderate cardiomegaly with small to moderate pericardial effusion, small right and trace left pleural effusions, and mild interstitial pulmonary edema, consistent with congestive heart failure. 3. Subsegmental atelectasis in both lower lobes with more focal patchy opacity and small pneumatocele in the  medial aspect of the left lower lobe that may be postinfectious or postinflammatory scarring. Given the patient's smoking history, follow-up chest CT in 3 months is recommended. Critical Value/emergent results were called by telephone at the time of interpretation on 10/04/2019 at 4:08 pm to provider [facility] , who verbally acknowledged these results. Electronically Signed   By: Titus Dubin M.D.   On: 10/04/2019 16:12   ECHOCARDIOGRAM COMPLETE  Result Date: 10/05/2019    ECHOCARDIOGRAM REPORT   Patient Name:   MARSDEN Urbieta Date of Exam: 10/05/2019 Medical Rec #:  [id number]    Height:       70.0 in Accession #:    [id number]   Weight:       180.0 lb Date of Birth:  1980-07-22     BSA:          1.996 m Patient Age:    75 years     BP:           110/81 mmHg Patient Gender: M            HR:           113 bpm. Exam Location:  Inpatient Procedure: 2D Echo, Cardiac Doppler and Color Doppler Indications:    Dyspnea  History:        Patient has no prior history of Echocardiogram examinations.                 Arrythmias:sinus tachycardia, Signs/Symptoms:Dyspnea and Chest                 Pain; Risk Factors:Current Smoker. Pulmonary emboli.  Sonographer:    Dustin Flock Referring Phys: [phone] TIFFANY [place] IMPRESSIONS  1. Left ventricular ejection fraction, by estimation, is <20%. The left ventricle has severely decreased function. The left ventricle demonstrates global hypokinesis. The left ventricular internal cavity size was moderately to severely dilated. Prominent trabeculation, consider LV noncompaction (could assess more closely with cardiac MRI). Left ventricular diastolic parameters are consistent with Grade III diastolic dysfunction (restrictive). Elevated left ventricular end-diastolic pressure.  2. Right ventricular systolic function is moderately reduced. The right ventricular size is mildly enlarged. There is mildly elevated pulmonary artery systolic pressure. The estimated right ventricular  systolic pressure is 25.0 mmHg.  3. Left atrial size was moderately dilated.  4. Right atrial size was mildly dilated.  5. The mitral valve is normal in structure. Mild mitral valve regurgitation. No evidence of mitral stenosis.  6. The aortic valve is tricuspid. Aortic valve regurgitation is not visualized. No aortic stenosis is present.  7. The inferior vena cava is dilated in size with <50% respiratory variability, suggesting right atrial pressure of 15 mmHg. FINDINGS  Left Ventricle: Left ventricular ejection fraction, by estimation, is <20%. The left ventricle has severely decreased function. The left ventricle demonstrates global hypokinesis. The left ventricular internal cavity size was moderately to severely dilated. There is  no left ventricular hypertrophy. Left ventricular diastolic parameters are consistent with Grade III diastolic dysfunction (restrictive). Elevated left ventricular end-diastolic pressure. Right Ventricle: The right ventricular size is mildly enlarged. No increase in right ventricular wall thickness. Right ventricular systolic function is moderately reduced. There is mildly elevated pulmonary artery systolic pressure. The tricuspid regurgitant velocity is 2.41 m/s, and with an assumed right atrial pressure of 15 mmHg, the estimated right ventricular systolic pressure is 15.4 mmHg. Left Atrium: Left atrial size was moderately dilated. Right Atrium: Right atrial size was mildly dilated. Prominent Chiari network. Pericardium: A small pericardial effusion is present. There is no evidence of cardiac tamponade. Mitral Valve: The mitral valve is normal in structure. Mild mitral valve regurgitation. No evidence of mitral valve stenosis. Tricuspid Valve: The tricuspid valve is normal in structure. Tricuspid valve regurgitation is trivial. Aortic Valve: The aortic valve is tricuspid. Aortic valve regurgitation is not visualized. No aortic stenosis is present. Pulmonic Valve: The pulmonic valve was  normal in structure. Pulmonic valve regurgitation is trivial. Aorta: The aortic root is normal in size and structure. Venous: The inferior vena cava is dilated in size with less than 50% respiratory variability, suggesting right atrial pressure of 15 mmHg. IAS/Shunts: No atrial level shunt detected by color flow Doppler.  LEFT VENTRICLE PLAX 2D LVIDd:         7.00 cm  Diastology LVIDs:         6.30 cm  LV e' lateral:   4.90 cm/s LV PW:         1.00 cm  LV E/e' lateral: 19.1 LV IVS:        1.00 cm  LV e' medial:    3.56 cm/s LVOT diam:     2.70 cm  LV E/e' medial:  26.2 LV SV:         51 LV SV Index:   25 LVOT Area:     5.73 cm  RIGHT VENTRICLE RV Basal diam:  2.50 cm RV S prime:     7.29 cm/s TAPSE (M-mode): 2.4 cm LEFT ATRIUM              Index       RIGHT ATRIUM           Index LA diam:        4.90 cm  2.46 cm/m  RA Area:     22.90 cm LA Vol (A2C):   71.0 ml  35.57 ml/m RA Volume:   69.10 ml  34.62 ml/m LA Vol (A4C):   109.0 ml 54.61 ml/m LA Biplane Vol: 96.8 ml  48.50 ml/m  AORTIC VALVE LVOT Vmax:   64.60 cm/s LVOT Vmean:  43.500 cm/s LVOT VTI:    0.088 m  AORTA Ao Root diam: 3.10 cm MITRAL VALVE               TRICUSPID VALVE MV Area (PHT): 7.16 cm    TR Peak grad:   23.2 mmHg MV Decel Time: 106 msec    TR Vmax:        241.00 cm/s MV E velocity: 93.40 cm/s MV A velocity: 30.00 cm/s  SHUNTS MV E/A ratio:  3.11        Systemic VTI:  0.09 m                            Systemic Diam: 2.70 cm Loralie Champagne MD Electronically signed by Loralie Champagne MD Signature Date/Time: 10/05/2019/10:21:58  AM    Final     Scheduled Meds: . folic acid  1 mg Oral Daily  . furosemide  40 mg Intravenous BID  . multivitamin with minerals  1 tablet Oral Daily  . sodium chloride flush  3 mL Intravenous Once  . sodium chloride flush  3 mL Intravenous Q12H  . thiamine  100 mg Oral Daily   Or  . thiamine  100 mg Intravenous Daily    Continuous Infusions: . sodium chloride    . heparin 1,300 Units/hr (10/05/19 1057)  .  magnesium sulfate bolus IVPB       LOS: 1 day     Kayleen Memos, MD [facility] Pager [phone]  If 7PM-7AM, please contact night-coverage [email or web address] Password [facility] 10/05/2019, 11:58 AM

## 2019-10-05 NOTE — Progress Notes (Signed)
   10/05/19 1641  Assess: MEWS Score  Temp (!) 97.5 F (36.4 C)  BP 114/82  Pulse Rate (!) 112  Resp 18  SpO2 100 %  O2 Device Room Air  Patient Activity (if Appropriate) In bed  Assess: MEWS Score  MEWS Temp 0  MEWS Systolic 0  MEWS Pulse 2  MEWS RR 0  MEWS LOC 0  MEWS Score 2  MEWS Score Color Yellow  Assess: if the MEWS score is Yellow or Red  Were vital signs taken at a resting state? Yes  Focused Assessment Documented focused assessment  Early Detection of Sepsis Score *See Row Information* Low  MEWS guidelines implemented *See Row Information* No, previously yellow, continue vital signs every 4 hours

## 2019-10-06 ENCOUNTER — Encounter (HOSPITAL_COMMUNITY)
Admission: EM | Disposition: A | Discharge status: Home / Self Care | Payer: Self-pay | Source: Home / Self Care | Attending: Cardiovascular Disease

## 2019-10-06 DIAGNOSIS — I2699 Other pulmonary embolism without acute cor pulmonale: Secondary | ICD-10-CM

## 2019-10-06 DIAGNOSIS — F1010 Alcohol abuse, uncomplicated: Secondary | ICD-10-CM

## 2019-10-06 DIAGNOSIS — Z720 Tobacco use: Secondary | ICD-10-CM

## 2019-10-06 HISTORY — PX: RIGHT/LEFT HEART CATH AND CORONARY ANGIOGRAPHY: CATH118266

## 2019-10-06 LAB — HEPARIN LEVEL (UNFRACTIONATED): Heparin Unfractionated: 0.44 IU/mL (ref 0.30–0.70)

## 2019-10-06 LAB — POCT I-STAT EG7
Acid-Base Excess: 5 mmol/L — ABNORMAL HIGH (ref 0.0–2.0)
Acid-Base Excess: 7 mmol/L — ABNORMAL HIGH (ref 0.0–2.0)
Bicarbonate: 30.5 mmol/L — ABNORMAL HIGH (ref 20.0–28.0)
Bicarbonate: 32.1 mmol/L — ABNORMAL HIGH (ref 20.0–28.0)
Calcium, Ion: 1.16 mmol/L (ref 1.15–1.40)
Calcium, Ion: 1.18 mmol/L (ref 1.15–1.40)
HCT: 41 % (ref 39.0–52.0)
HCT: 42 % (ref 39.0–52.0)
Hemoglobin: 13.9 g/dL (ref 13.0–17.0)
Hemoglobin: 14.3 g/dL (ref 13.0–17.0)
O2 Saturation: 69 %
O2 Saturation: 70 %
Potassium: 3.9 mmol/L (ref 3.5–5.1)
Potassium: 4 mmol/L (ref 3.5–5.1)
Sodium: 136 mmol/L (ref 135–145)
Sodium: 137 mmol/L (ref 135–145)
TCO2: 32 mmol/L (ref 22–32)
TCO2: 33 mmol/L — ABNORMAL HIGH (ref 22–32)
pCO2, Ven: 46.3 mmHg (ref 44.0–60.0)
pCO2, Ven: 47.7 mmHg (ref 44.0–60.0)
pH, Ven: 7.426 (ref 7.250–7.430)
pH, Ven: 7.435 — ABNORMAL HIGH (ref 7.250–7.430)
pO2, Ven: 35 mmHg (ref 32.0–45.0)
pO2, Ven: 36 mmHg (ref 32.0–45.0)

## 2019-10-06 LAB — POCT I-STAT 7, (LYTES, BLD GAS, ICA,H+H)
Acid-Base Excess: 6 mmol/L — ABNORMAL HIGH (ref 0.0–2.0)
Bicarbonate: 30.2 mmol/L — ABNORMAL HIGH (ref 20.0–28.0)
Calcium, Ion: 1.12 mmol/L — ABNORMAL LOW (ref 1.15–1.40)
HCT: 41 % (ref 39.0–52.0)
Hemoglobin: 13.9 g/dL (ref 13.0–17.0)
O2 Saturation: 100 %
Potassium: 3.7 mmol/L (ref 3.5–5.1)
Sodium: 136 mmol/L (ref 135–145)
TCO2: 31 mmol/L (ref 22–32)
pCO2 arterial: 40 mmHg (ref 32.0–48.0)
pH, Arterial: 7.486 — ABNORMAL HIGH (ref 7.350–7.450)
pO2, Arterial: 165 mmHg — ABNORMAL HIGH (ref 83.0–108.0)

## 2019-10-06 LAB — CBC
HCT: 41.6 % (ref 39.0–52.0)
Hemoglobin: 13.4 g/dL (ref 13.0–17.0)
MCH: 29.6 pg (ref 26.0–34.0)
MCHC: 32.2 g/dL (ref 30.0–36.0)
MCV: 91.8 fL (ref 80.0–100.0)
Platelets: 272 10*3/uL (ref 150–400)
RBC: 4.53 MIL/uL (ref 4.22–5.81)
RDW: 12.9 % (ref 11.5–15.5)
WBC: 4.9 10*3/uL (ref 4.0–10.5)
nRBC: 0 % (ref 0.0–0.2)

## 2019-10-06 LAB — BASIC METABOLIC PANEL
Anion gap: 14 (ref 5–15)
BUN: 16 mg/dL (ref 6–20)
CO2: 28 mmol/L (ref 22–32)
Calcium: 8.7 mg/dL — ABNORMAL LOW (ref 8.9–10.3)
Chloride: 95 mmol/L — ABNORMAL LOW (ref 98–111)
Creatinine, Ser: 1.25 mg/dL — ABNORMAL HIGH (ref 0.61–1.24)
GFR calc Af Amer: >60 mL/min (ref >60)
GFR calc non Af Amer: >60 mL/min (ref >60)
Glucose, Bld: 129 mg/dL — ABNORMAL HIGH (ref 70–99)
Potassium: 3.3 mmol/L — ABNORMAL LOW (ref 3.5–5.1)
Sodium: 137 mmol/L (ref 135–145)

## 2019-10-06 SURGERY — RIGHT/LEFT HEART CATH AND CORONARY ANGIOGRAPHY
Anesthesia: LOCAL

## 2019-10-06 MED ORDER — METOPROLOL SUCCINATE ER 25 MG PO TB24
12.5000 mg | ORAL_TABLET | Freq: Every day | ORAL | Status: DC
  Administered 2019-10-07: 12.5 mg via ORAL
  Filled 2019-10-06 (×3): qty 1

## 2019-10-06 MED ORDER — FENTANYL CITRATE (PF) 100 MCG/2ML IJ SOLN
INTRAMUSCULAR | Status: AC
  Filled 2019-10-06: qty 2

## 2019-10-06 MED ORDER — LABETALOL HCL 5 MG/ML IV SOLN: 10.0000 mg | INTRAVENOUS | Status: AC | PRN

## 2019-10-06 MED ORDER — DIAZEPAM 5 MG PO TABS
5.0000 mg | ORAL_TABLET | Freq: Four times a day (QID) | ORAL | Status: DC | PRN
  Administered 2019-10-09: 5 mg via ORAL
  Filled 2019-10-06: qty 1

## 2019-10-06 MED ORDER — IOHEXOL 350 MG/ML SOLN
INTRAVENOUS | Status: DC | PRN
  Administered 2019-10-06: 60 mL

## 2019-10-06 MED ORDER — ASPIRIN 81 MG PO CHEW: 81.0000 mg | CHEWABLE_TABLET | ORAL | Status: DC

## 2019-10-06 MED ORDER — HEPARIN SODIUM (PORCINE) 1000 UNIT/ML IJ SOLN
INTRAMUSCULAR | Status: AC
  Filled 2019-10-06: qty 1

## 2019-10-06 MED ORDER — SODIUM CHLORIDE 0.9 % IV SOLN: INTRAVENOUS | Status: DC

## 2019-10-06 MED ORDER — SODIUM CHLORIDE 0.9 % IV SOLN: 250.0000 mL | INTRAVENOUS | Status: DC | PRN

## 2019-10-06 MED ORDER — MIDAZOLAM HCL 2 MG/2ML IJ SOLN
INTRAMUSCULAR | Status: DC | PRN
  Administered 2019-10-06: 2 mg via INTRAVENOUS
  Administered 2019-10-06: 1 mg via INTRAVENOUS

## 2019-10-06 MED ORDER — SODIUM CHLORIDE 0.9% FLUSH
3.0000 mL | INTRAVENOUS | Status: DC | PRN
  Administered 2019-10-08: 3 mL via INTRAVENOUS

## 2019-10-06 MED ORDER — HYDRALAZINE HCL 20 MG/ML IJ SOLN: 10.0000 mg | INTRAMUSCULAR | Status: AC | PRN

## 2019-10-06 MED ORDER — VERAPAMIL HCL 2.5 MG/ML IV SOLN
INTRAVENOUS | Status: DC | PRN
  Administered 2019-10-06: 10 mL via INTRA_ARTERIAL

## 2019-10-06 MED ORDER — ACETAMINOPHEN 325 MG PO TABS: 650.0000 mg | ORAL_TABLET | ORAL | Status: DC | PRN

## 2019-10-06 MED ORDER — SODIUM CHLORIDE 0.9 % IV SOLN
INTRAVENOUS | Status: AC | PRN
  Administered 2019-10-06: 10 mL/h via INTRAVENOUS

## 2019-10-06 MED ORDER — VERAPAMIL HCL 2.5 MG/ML IV SOLN
INTRAVENOUS | Status: AC
  Filled 2019-10-06: qty 2

## 2019-10-06 MED ORDER — MIDAZOLAM HCL 2 MG/2ML IJ SOLN
INTRAMUSCULAR | Status: AC
  Filled 2019-10-06: qty 2

## 2019-10-06 MED ORDER — LIDOCAINE HCL (PF) 1 % IJ SOLN
INTRAMUSCULAR | Status: DC | PRN
  Administered 2019-10-06 (×2): 3 mL

## 2019-10-06 MED ORDER — LIDOCAINE HCL (PF) 1 % IJ SOLN
INTRAMUSCULAR | Status: AC
  Filled 2019-10-06: qty 30

## 2019-10-06 MED ORDER — SODIUM CHLORIDE 0.9% FLUSH
3.0000 mL | Freq: Two times a day (BID) | INTRAVENOUS | Status: DC
  Administered 2019-10-07 – 2019-10-08 (×3): 3 mL via INTRAVENOUS

## 2019-10-06 MED ORDER — SODIUM CHLORIDE 0.9 % IV SOLN: INTRAVENOUS | Status: AC

## 2019-10-06 MED ORDER — HEPARIN (PORCINE) IN NACL 1000-0.9 UT/500ML-% IV SOLN
INTRAVENOUS | Status: DC | PRN
  Administered 2019-10-06 (×2): 500 mL

## 2019-10-06 MED ORDER — HEPARIN (PORCINE) IN NACL 1000-0.9 UT/500ML-% IV SOLN
INTRAVENOUS | Status: AC
  Filled 2019-10-06: qty 1000

## 2019-10-06 MED ORDER — HEPARIN (PORCINE) 25000 UT/250ML-% IV SOLN
1300.0000 [IU]/h | INTRAVENOUS | Status: DC
  Administered 2019-10-06: 1300 [IU]/h via INTRAVENOUS
  Filled 2019-10-06: qty 250

## 2019-10-06 MED ORDER — ONDANSETRON HCL 4 MG/2ML IJ SOLN: 4.0000 mg | Freq: Four times a day (QID) | INTRAMUSCULAR | Status: DC | PRN

## 2019-10-06 MED ORDER — HEPARIN SODIUM (PORCINE) 1000 UNIT/ML IJ SOLN
INTRAMUSCULAR | Status: DC | PRN
  Administered 2019-10-06: 4000 [IU] via INTRAVENOUS

## 2019-10-06 MED ORDER — FENTANYL CITRATE (PF) 100 MCG/2ML IJ SOLN
INTRAMUSCULAR | Status: DC | PRN
  Administered 2019-10-06: 25 ug via INTRAVENOUS

## 2019-10-06 MED ORDER — POTASSIUM CHLORIDE CRYS ER 20 MEQ PO TBCR
40.0000 meq | EXTENDED_RELEASE_TABLET | Freq: Two times a day (BID) | ORAL | Status: AC
  Administered 2019-10-06 (×2): 40 meq via ORAL
  Filled 2019-10-06 (×2): qty 2

## 2019-10-06 SURGICAL SUPPLY — 12 items
CATH 5FR JL3.5 JR4 ANG PIG MP (CATHETERS) ×1 IMPLANT
CATH BALLN WEDGE 5F 110CM (CATHETERS) ×1 IMPLANT
DEVICE RAD COMP TR BAND LRG (VASCULAR PRODUCTS) ×1 IMPLANT
GLIDESHEATH SLEND SS 6F .021 (SHEATH) ×1 IMPLANT
GUIDEWIRE INQWIRE 1.5J.035X260 (WIRE) IMPLANT
INQWIRE 1.5J .035X260CM (WIRE) ×2
KIT HEART LEFT (KITS) ×2 IMPLANT
PACK CARDIAC CATHETERIZATION (CUSTOM PROCEDURE TRAY) ×2 IMPLANT
SHEATH GLIDE SLENDER 4/5FR (SHEATH) ×1 IMPLANT
SYR MEDRAD MARK 7 150ML (SYRINGE) ×2 IMPLANT
TRANSDUCER W/STOPCOCK (MISCELLANEOUS) ×2 IMPLANT
TUBING CIL FLEX 10 FLL-RA (TUBING) ×2 IMPLANT

## 2019-10-06 NOTE — Progress Notes (Signed)
Nutrition Brief Note  Patient identified on the Malnutrition Screening Tool (MST) Report  Wt Readings from Last 15 Encounters:  10/06/19 82.1 kg  02/05/19 85.3 kg  08/03/18 86.2 kg  03/01/18 86.2 kg  12/04/17 83.5 kg   Florentino Laabs is a 39 y.o. male with medical history significant for asthma who presents to emergency room for evaluation of a 1 week history of shortness of breath which initially was with exertion but he is not short of breath at rest.  He also complains of midsternal chest pain, intermittent, nonradiating and occurs with exertion.  Pt admitted with acute PE.   Reviewed I/O's: -658 ml x 24 hours and -7.3 L since admission  UOP: 1.9 L x 24 hours  Pt down in cath lab at time of visit. Also attempted to speak with pt via phone, however, no answer.   Medications reviewed and include lasix, KCl, and thiamine.   Labs reviewed: K: 3.3 (on PO supplementation).   Current diet order is heart healthy, patient is consuming approximately 100% of meals at this time. Labs and medications reviewed.   No nutrition interventions warranted at this time. If nutrition issues arise, please consult RD.   [name], RD, LDN, [place] Registered Dietitian II Certified Diabetes Care and Education Specialist Please refer to [facility] for RD and/or RD on-call/weekend/after hours pager

## 2019-10-06 NOTE — Progress Notes (Signed)
ANTICOAGULATION CONSULT NOTE - Follow Up Consult  Pharmacy Consult for Heparin Indication: pulmonary embolus  No Known Allergies  Patient Measurements: Height: 5\' 10"  (177.8 cm) Weight: 82.1 kg (180 lb 14.4 oz) IBW/kg (Calculated) : 73 Heparin Dosing Weight:    Vital Signs: Temp: 98 F (36.7 C) (05/26 1243) Temp Source: Oral (05/26 0751) BP: 109/77 (05/26 1243) Pulse Rate: 104 (05/26 1243)  Labs: Recent Labs    10/04/19 1041 10/04/19 1041 10/04/19 1205 10/04/19 1711 10/05/19 0235 10/05/19 0509 10/05/19 0940 10/06/19 0618  HGB 13.3   < >  --   --   --  14.6  --  13.4  HCT 41.2  --   --   --   --  45.2  --  41.6  PLT 286  --   --   --   --  303  --  272  APTT  --   --   --  27  --   --   --   --   LABPROT  --   --   --  14.1  --   --   --   --   INR  --   --   --  1.1  --   --   --   --   HEPARINUNFRC  --   --   --   --  0.63  --  0.43 0.44  CREATININE 0.96  --   --   --   --  1.27*  --  1.25*  TROPONINIHS 22*  --  23*  --   --   --   --   --    < > = values in this interval not displayed.    Estimated Creatinine Clearance: 82.7 mL/min (A) (by C-G formula based on SCr of 1.25 mg/dL (H)).  Assessment:  Anticoag: +LLL PE (RV/LV ratio 1.1) HL 0.44 in goal. CBC WNL. Dopplers negative for DVT - 5/26 Cath: [place] out 1225. Cath report pending.  Goal of Therapy:  Heparin level 0.3-0.7 units/ml Monitor platelets by anticoagulation protocol: Yes   Plan:  Resume IV heparin at 1300 units/hr at 2025 tonight. Daily HL and CBC   Robert [name] S. [place], PharmD, BCPS Clinical Staff Pharmacist [email or web address] Alford Highland, [employer] 10/06/2019,12:53 PM

## 2019-10-06 NOTE — H&P (View-Only) (Signed)
Progress Note  Patient Name: Robert [name] Date of Encounter: 10/06/2019  Primary Cardiologist: Skeet Latch, MD   Subjective   Feels well.  Breathing improving.  Unable to lay flat completely.  Inpatient Medications    Scheduled Meds: . digoxin  0.25 mg Oral Daily  . folic acid  1 mg Oral Daily  . furosemide  40 mg Intravenous BID  . metoprolol succinate  12.5 mg Oral Daily  . multivitamin with minerals  1 tablet Oral Daily  . potassium chloride  40 mEq Oral BID  . sodium chloride flush  3 mL Intravenous Once  . sodium chloride flush  3 mL Intravenous Q12H  . thiamine  100 mg Oral Daily   Or  . thiamine  100 mg Intravenous Daily   Continuous Infusions: . sodium chloride    . heparin 1,300 Units/hr (10/06/19 0715)   PRN Meds: sodium chloride, iohexol, LORazepam **OR** LORazepam, ondansetron **OR** ondansetron (ZOFRAN) IV, sodium chloride flush   Vital Signs    Vitals:   10/06/19 0045 10/06/19 0435 10/06/19 0500 10/06/19 0751  BP: 103/82 94/81 99/84  102/81  Pulse: (!) 109 (!) 111 (!) 108 (!) 109  Resp: 18 18 18 16   Temp: 98.7 F (37.1 C) 97.6 F (36.4 C) 98 F (36.7 C) 98.2 F (36.8 C)  TempSrc: Oral Oral Oral Oral  SpO2: 99% 100% 100% 99%  Weight:  82.1 kg    Height:        Intake/Output Summary (Last 24 hours) at 10/06/2019 0951 Last data filed at 10/06/2019 0807 Gross per 24 hour  Intake 1431.84 ml  Output 1850 ml  Net -418.16 ml   Last 3 Weights 10/06/2019 10/05/2019 10/04/2019  Weight (lbs) 180 lb 14.4 oz 181 lb 3.2 oz 180 lb  Weight (kg) 82.056 kg 82.192 kg 81.647 kg      Telemetry    Sinus tachycardia.  Heart rate 90s to 110s.- Personally Reviewed  ECG    n/a - Personally Reviewed  Physical Exam   VS:  BP 102/81 (BP Location: Left Arm)   Pulse (!) 109   Temp 98.2 F (36.8 C) (Oral)   Resp 16   Ht 5\' 10"  (1.778 m)   Wt 82.1 kg   SpO2 99%   BMI 25.96 kg/m  , BMI Body mass index is 25.96 kg/m. GENERAL:  Well appearing HEENT:  Pupils equal round and reactive, fundi not visualized, oral mucosa unremarkable NECK:  No jugular venous distention, waveform within normal limits, carotid upstroke brisk and symmetric, no bruits LUNGS:  Clear to auscultation bilaterally HEART:  RRR.  PMI not displaced or sustained,S1 and S2 within normal limits, no S3, no S4, no clicks, no rubs, no murmurs ABD:  Flat, positive bowel sounds normal in frequency in pitch, no bruits, no rebound, no guarding, no midline pulsatile mass, no hepatomegaly, no splenomegaly EXT:  2 plus pulses throughout, no edema, no cyanosis no clubbing SKIN:  No rashes no nodules NEURO:  Cranial nerves II through XII grossly intact, motor grossly intact throughout PSYCH:  Cognitively intact, oriented to person place and time   Labs    High Sensitivity Troponin:   Recent Labs  Lab 10/04/19 1041 10/04/19 1205  TROPONINIHS 22* 23*      Chemistry Recent Labs  Lab 10/04/19 1041 10/05/19 0509 10/06/19 0618  NA 138 138 137  K 3.5 3.7 3.3*  CL 107 102 95*  CO2 20* 28 28  GLUCOSE 120* 94 129*  BUN 19 19 16  CREATININE 0.96 1.27* 1.25*  CALCIUM 8.3* 8.7* 8.7*  GFRNONAA >60 >60 >60  GFRAA >60 >60 >60  ANIONGAP 11 8 14      Hematology Recent Labs  Lab 10/04/19 1041 10/05/19 0509 10/06/19 0618  WBC 5.2 5.7 4.9  RBC 4.45 4.91 4.53  HGB 13.3 14.6 13.4  HCT 41.2 45.2 41.6  MCV 92.6 92.1 91.8  MCH 29.9 29.7 29.6  MCHC 32.3 32.3 32.2  RDW 12.9 12.9 12.9  PLT 286 303 272    BNP Recent Labs  Lab 10/04/19 1041  BNP 2,614.9*     DDimer No results for input(s): DDIMER in the last 168 hours.   Radiology    DG Chest 2 View  Result Date: 10/04/2019 CLINICAL DATA:  Shortness of breath with intermittent chest pain EXAM: CHEST - 2 VIEW COMPARISON:  August 03, 2018 FINDINGS: There is cardiomegaly with pulmonary venous hypertension. There is mild atelectatic change in each mid lung. There is no frank edema or airspace opacity. No pleural effusions  evident. No adenopathy. No bone lesions. IMPRESSION: Cardiomegaly with pulmonary vascular congestion. Mild midlung atelectatic change. Lungs otherwise clear. No evident adenopathy. Electronically Signed   By: Lowella Grip III M.D.   On: 10/04/2019 10:21   CT ANGIO CHEST PE W OR WO CONTRAST  Result Date: 10/04/2019 CLINICAL DATA:  Intermittent chest pain and palpitations for the past week. EXAM: CT ANGIOGRAPHY CHEST WITH CONTRAST TECHNIQUE: Multidetector CT imaging of the chest was performed using the standard protocol during bolus administration of intravenous contrast. Multiplanar CT image reconstructions and MIPs were obtained to evaluate the vascular anatomy. CONTRAST:  176mL OMNIPAQUE IOHEXOL 350 MG/ML SOLN COMPARISON:  Chest x-ray from same day. FINDINGS: Cardiovascular: Satisfactory opacification of the pulmonary arteries to the segmental level. Acute nonocclusive lobar and segmental pulmonary emboli in the left lower lobe. No additional pulmonary emboli. Moderate cardiomegaly. Elevated RV/LV ratio measuring 1.1. Small to moderate pericardial effusion. No thoracic aortic aneurysm. Reflux of contrast into the hepatic veins. Mediastinum/Nodes: Prominent subcentimeter mediastinal and hilar lymph nodes are likely reactive. No enlarged axillary lymph nodes. The thyroid gland, trachea, and esophagus demonstrate no significant findings. Lungs/Pleura: Small right and trace left pleural effusions. Scattered mild interlobular septal thickening. Subsegmental atelectasis in both lower lobes with more focal patchy opacity and small pneumatocele in the medial aspect of the left lower lobe. No pneumothorax. Upper Abdomen: No acute abnormality. Musculoskeletal: No chest wall abnormality. No acute or significant osseous findings. Review of the MIP images confirms the above findings. IMPRESSION: 1. Acute nonocclusive lobar and segmental pulmonary emboli in the left lower lobe. Positive for acute PE with CT evidence of  right heart strain (RV/LV Ratio = 1.1) consistent with at least submassive (intermediate risk) PE. The presence of right heart strain has been associated with an increased risk of morbidity and mortality. 2. Moderate cardiomegaly with small to moderate pericardial effusion, small right and trace left pleural effusions, and mild interstitial pulmonary edema, consistent with congestive heart failure. 3. Subsegmental atelectasis in both lower lobes with more focal patchy opacity and small pneumatocele in the medial aspect of the left lower lobe that may be postinfectious or postinflammatory scarring. Given the patient's smoking history, follow-up chest CT in 3 months is recommended. Critical Value/emergent results were called by telephone at the time of interpretation on 10/04/2019 at 4:08 pm to provider [facility] , who verbally acknowledged these results. Electronically Signed   By: Titus Dubin M.D.   On: 10/04/2019 16:12   ECHOCARDIOGRAM  COMPLETE  Result Date: 10/05/2019    ECHOCARDIOGRAM REPORT   Patient Name:   Robert [name] Date of Exam: 10/05/2019 Medical Rec #:  [id number]    Height:       70.0 in Accession #:    [id number]   Weight:       180.0 lb Date of Birth:  [birth year]     BSA:          1.996 m Patient Age:    77 years     BP:           110/81 mmHg Patient Gender: M            HR:           113 bpm. Exam Location:  Inpatient Procedure: 2D Echo, Cardiac Doppler and Color Doppler Indications:    Dyspnea  History:        Patient has no prior history of Echocardiogram examinations.                 Arrythmias:sinus tachycardia, Signs/Symptoms:Dyspnea and Chest                 Pain; Risk Factors:Current Smoker. Pulmonary emboli.  Sonographer:    Dustin Flock Referring Phys: [phone] Jashad Depaula Belk IMPRESSIONS  1. Left ventricular ejection fraction, by estimation, is <20%. The left ventricle has severely decreased function. The left ventricle demonstrates global hypokinesis. The left ventricular  internal cavity size was moderately to severely dilated. Prominent trabeculation, consider LV noncompaction (could assess more closely with cardiac MRI). Left ventricular diastolic parameters are consistent with Grade III diastolic dysfunction (restrictive). Elevated left ventricular end-diastolic pressure.  2. Right ventricular systolic function is moderately reduced. The right ventricular size is mildly enlarged. There is mildly elevated pulmonary artery systolic pressure. The estimated right ventricular systolic pressure is 14.4 mmHg.  3. Left atrial size was moderately dilated.  4. Right atrial size was mildly dilated.  5. The mitral valve is normal in structure. Mild mitral valve regurgitation. No evidence of mitral stenosis.  6. The aortic valve is tricuspid. Aortic valve regurgitation is not visualized. No aortic stenosis is present.  7. The inferior vena cava is dilated in size with <50% respiratory variability, suggesting right atrial pressure of 15 mmHg. FINDINGS  Left Ventricle: Left ventricular ejection fraction, by estimation, is <20%. The left ventricle has severely decreased function. The left ventricle demonstrates global hypokinesis. The left ventricular internal cavity size was moderately to severely dilated. There is no left ventricular hypertrophy. Left ventricular diastolic parameters are consistent with Grade III diastolic dysfunction (restrictive). Elevated left ventricular end-diastolic pressure. Right Ventricle: The right ventricular size is mildly enlarged. No increase in right ventricular wall thickness. Right ventricular systolic function is moderately reduced. There is mildly elevated pulmonary artery systolic pressure. The tricuspid regurgitant velocity is 2.41 m/s, and with an assumed right atrial pressure of 15 mmHg, the estimated right ventricular systolic pressure is 81.8 mmHg. Left Atrium: Left atrial size was moderately dilated. Right Atrium: Right atrial size was mildly dilated.  Prominent Chiari network. Pericardium: A small pericardial effusion is present. There is no evidence of cardiac tamponade. Mitral Valve: The mitral valve is normal in structure. Mild mitral valve regurgitation. No evidence of mitral valve stenosis. Tricuspid Valve: The tricuspid valve is normal in structure. Tricuspid valve regurgitation is trivial. Aortic Valve: The aortic valve is tricuspid. Aortic valve regurgitation is not visualized. No aortic stenosis is present. Pulmonic Valve: The pulmonic valve was normal in structure. Pulmonic valve  regurgitation is trivial. Aorta: The aortic root is normal in size and structure. Venous: The inferior vena cava is dilated in size with less than 50% respiratory variability, suggesting right atrial pressure of 15 mmHg. IAS/Shunts: No atrial level shunt detected by color flow Doppler.  LEFT VENTRICLE PLAX 2D LVIDd:         7.00 cm  Diastology LVIDs:         6.30 cm  LV e' lateral:   4.90 cm/s LV PW:         1.00 cm  LV E/e' lateral: 19.1 LV IVS:        1.00 cm  LV e' medial:    3.56 cm/s LVOT diam:     2.70 cm  LV E/e' medial:  26.2 LV SV:         51 LV SV Index:   25 LVOT Area:     5.73 cm  RIGHT VENTRICLE RV Basal diam:  2.50 cm RV S prime:     7.29 cm/s TAPSE (M-mode): 2.4 cm LEFT ATRIUM              Index       RIGHT ATRIUM           Index LA diam:        4.90 cm  2.46 cm/m  RA Area:     22.90 cm LA Vol (A2C):   71.0 ml  35.57 ml/m RA Volume:   69.10 ml  34.62 ml/m LA Vol (A4C):   109.0 ml 54.61 ml/m LA Biplane Vol: 96.8 ml  48.50 ml/m  AORTIC VALVE LVOT Vmax:   64.60 cm/s LVOT Vmean:  43.500 cm/s LVOT VTI:    0.088 m  AORTA Ao Root diam: 3.10 cm MITRAL VALVE               TRICUSPID VALVE MV Area (PHT): 7.16 cm    TR Peak grad:   23.2 mmHg MV Decel Time: 106 msec    TR Vmax:        241.00 cm/s MV E velocity: 93.40 cm/s MV A velocity: 30.00 cm/s  SHUNTS MV E/A ratio:  3.11        Systemic VTI:  0.09 m                            Systemic Diam: 2.70 cm Loralie Champagne  MD Electronically signed by Loralie Champagne MD Signature Date/Time: 10/05/2019/10:21:58 AM    Final    VAS [place] LOWER EXTREMITY VENOUS (DVT)  Result Date: 10/05/2019  Lower Venous DVTStudy Indications: Pulmonary embolism, and CHF.  Comparison Study: none Performing Technologist: June Leap RDMS, RVT  Examination Guidelines: A complete evaluation includes B-mode imaging, spectral Doppler, color Doppler, and power Doppler as needed of all accessible portions of each vessel. Bilateral testing is considered an integral part of a complete examination. Limited examinations for reoccurring indications may be performed as noted. The reflux portion of the exam is performed with the patient in reverse Trendelenburg.  +---------+---------------+---------+-----------+----------+--------------+ RIGHT    CompressibilityPhasicitySpontaneityPropertiesThrombus Aging +---------+---------------+---------+-----------+----------+--------------+ CFV      Full           Yes      Yes                                 +---------+---------------+---------+-----------+----------+--------------+ SFJ      Full                                                        +---------+---------------+---------+-----------+----------+--------------+  FV Prox  Full                                                        +---------+---------------+---------+-----------+----------+--------------+ FV Mid   Full                                                        +---------+---------------+---------+-----------+----------+--------------+ FV DistalFull                                                        +---------+---------------+---------+-----------+----------+--------------+ PFV      Full                                                        +---------+---------------+---------+-----------+----------+--------------+ POP      Full           Yes      Yes                                  +---------+---------------+---------+-----------+----------+--------------+ PTV      Full                                                        +---------+---------------+---------+-----------+----------+--------------+ PERO     Full                                                        +---------+---------------+---------+-----------+----------+--------------+   +---------+---------------+---------+-----------+----------+--------------+ LEFT     CompressibilityPhasicitySpontaneityPropertiesThrombus Aging +---------+---------------+---------+-----------+----------+--------------+ CFV      Full           Yes      Yes                                 +---------+---------------+---------+-----------+----------+--------------+ SFJ      Full                                                        +---------+---------------+---------+-----------+----------+--------------+ FV Prox  Full                                                        +---------+---------------+---------+-----------+----------+--------------+  FV Mid   Full                                                        +---------+---------------+---------+-----------+----------+--------------+ FV DistalFull                                                        +---------+---------------+---------+-----------+----------+--------------+ PFV      Full                                                        +---------+---------------+---------+-----------+----------+--------------+ POP      Full           Yes      Yes                                 +---------+---------------+---------+-----------+----------+--------------+ PTV      Full                                                        +---------+---------------+---------+-----------+----------+--------------+ PERO     Full                                                         +---------+---------------+---------+-----------+----------+--------------+     Summary: BILATERAL: - No evidence of deep vein thrombosis seen in the lower extremities, bilaterally. -No evidence of popliteal cyst, bilaterally.   *See table(s) above for measurements and observations. Electronically signed by Deitra Mayo MD on 10/05/2019 at 3:28:35 PM.    Final     Cardiac Studies   Echo 10/05/19 1. Left ventricular ejection fraction, by estimation, is <20%. The left  ventricle has severely decreased function. The left ventricle demonstrates  global hypokinesis. The left ventricular internal cavity size was  moderately to severely dilated.  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 Left ventricular diastolic parameters are  consistent with Grade III diastolic dysfunction (restrictive). Elevated  left ventricular end-diastolic pressure.  2. Right ventricular systolic function is moderately reduced. The right  ventricular size is mildly enlarged. There is mildly elevated pulmonary  artery systolic pressure. The estimated right ventricular systolic  pressure is 69.6 mmHg.  3. Left atrial size was moderately dilated.  4. Right atrial size was mildly dilated.  5. The mitral valve is normal in structure. Mild mitral valve  regurgitation. No evidence of mitral stenosis.  6. The aortic valve is tricuspid. Aortic valve regurgitation is not  visualized. No aortic stenosis is present.  7. The inferior vena cava is dilated in size with <50% respiratory  variability, suggesting right  atrial pressure of 15 mmHg.   LE Doppler: NO DVT  Patient Profile     Mr. Deskins is a 66M with tobacco abuse, cocaine abuse, and asthma admitted with new onset heart failure  Assessment & Plan    # Acute systolic and diastolic heart failure: # Chest pain:  #Demand ischemia: #Cardiomegaly: #Palpitations: Mr. Whisenant has new onset heart failure.   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  His mother was in the room and notes that he drinks far more than he should.  She is also been concerned about his substance abuse.  He initially reported drinking too mini bottles for 5 times per week.  In her presence he actually admits to drinking 2 1/5 bottles 4-5 times per week.  He notes that in the past he had withdrawal symptoms if he did not drink but this has not been an issue lately.  He has been doing this for several years.  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  We discussed the importance of cessation of alcohol.  He did have some cold symptoms prior to the onset of the symptoms.  If the cath is negative today we will plan for a cardiac MRI tomorrow to better assess for noncompaction or myopericarditis given his chest pain elevated troponin on admission.  Blood pressure was initially low and he was quite tachycardic.  He was started on low-dose digoxin and has diuresed excessively.  He seems to be pretty euvolemic now.  Renal function is slightly higher.  We will stop IV Lasix and wait for direction on further diuresis from his right heart cath.  Start metoprolol succinate 12.5 mg daily.  We will try to add an ARB as blood pressure and renal function permit.  Risks and benefits of cardiac catheterization have been discussed with the patient.  The patient understands that risks included but are not limited to stroke (1 in 1000), death (1 in 40), kidney failure [usually temporary] (1 in 500), bleeding (1 in 200), allergic reaction [possibly serious] (1 in 200). The patient understands and agrees to proceed.   # PE:  Small.  No DVT on lower extremity Dopplers.  Continue heparin and transition to oral agent after cath.  # Polysubstance abuse: Importance of alcohol and substance abuse cessation was stressed.  We will place social work consult.  Watch for signs of withdrawal,  though he seems okay right now.   For questions or updates, please contact [place] Please consult [email or web address] for contact info under        Signed, Skeet Latch, MD  10/06/2019, 9:51 AM

## 2019-10-06 NOTE — Plan of Care (Signed)
  Problem: Education: Goal: Knowledge of General Education information will improve Description: Including pain rating scale, medication(s)/side effects and non-pharmacologic comfort measures Outcome: Progressing   Problem: Health Behavior/Discharge Planning: Goal: Ability to manage health-related needs will improve Outcome: Progressing   Problem: Clinical Measurements: Goal: Ability to maintain clinical measurements within normal limits will improve Outcome: Progressing Goal: Will remain free from infection Outcome: Progressing Goal: Diagnostic test results will improve Outcome: Progressing Goal: Cardiovascular complication will be avoided Outcome: Progressing   Problem: Nutrition: Goal: Adequate nutrition will be maintained Outcome: Progressing   Problem: Coping: Goal: Level of anxiety will decrease Outcome: Progressing   Problem: Elimination: Goal: Will not experience complications related to bowel motility Outcome: Progressing Goal: Will not experience complications related to urinary retention Outcome: Progressing   Problem: Pain Managment: Goal: General experience of comfort will improve Outcome: Progressing   Problem: Skin Integrity: Goal: Risk for impaired skin integrity will decrease Outcome: Progressing   Problem: Education: Goal: Ability to demonstrate management of disease process will improve Outcome: Progressing Goal: Ability to verbalize understanding of medication therapies will improve Outcome: Progressing

## 2019-10-06 NOTE — Progress Notes (Signed)
   10/06/19 1944  Assess: MEWS Score  Temp 98.1 F (36.7 C)  BP 96/74  Pulse Rate (!) 110  Resp 19  SpO2 99 %  Assess: MEWS Score  MEWS Temp 0  MEWS Systolic 1  MEWS Pulse 1  MEWS RR 0  MEWS LOC 0  MEWS Score 2  MEWS Score Color Yellow  Assess: if the MEWS score is Yellow or Red  Were vital signs taken at a resting state? Yes  Focused Assessment Documented focused assessment  Early Detection of Sepsis Score *See Row Information* Low  MEWS guidelines implemented *See Row Information* No, previously yellow, continue vital signs every 4 hours  Treat  MEWS Interventions Other (Comment) (Continue to monitor patient )  Notify: Charge Nurse/RN  Name of Charge Nurse/RN Notified [place], RN   Date Charge Nurse/RN Notified 10/06/19  Time Charge Nurse/RN Notified 1945  Document  Patient Outcome Other (Comment) (Patient has remianed stable)  Progress note created (see row info) Yes

## 2019-10-06 NOTE — Interval H&P Note (Signed)
Cath Lab Visit (complete for each Cath Lab visit)  Clinical Evaluation Leading to the Procedure:   ACS: No.  Non-ACS:    Anginal Classification: CCS II  Anti-ischemic medical therapy: No Therapy  Non-Invasive Test Results: No non-invasive testing performed  Prior CABG: No previous CABG      History and Physical Interval Note:  10/06/2019 11:27 AM  Robert [name]  has presented today for surgery, with the diagnosis of chest pain.  The various methods of treatment have been discussed with the patient and family. After consideration of risks, benefits and other options for treatment, the patient has consented to  Procedure(s): RIGHT/LEFT HEART CATH AND CORONARY ANGIOGRAPHY (N/A) as a surgical intervention.  The patient's history has been reviewed, patient examined, no change in status, stable for surgery.  I have reviewed the patient's chart and labs.  Questions were answered to the patient's satisfaction.     Shelva Majestic

## 2019-10-06 NOTE — Progress Notes (Signed)
ANTICOAGULATION CONSULT NOTE - Follow Up Consult  Pharmacy Consult for Heparin Indication: pulmonary embolus  No Known Allergies  Patient Measurements: Height: 5\' 10"  (177.8 cm) Weight: 82.1 kg (180 lb 14.4 oz) IBW/kg (Calculated) : 73 Heparin Dosing Weight:  82.1 kg  Vital Signs: Temp: 98.2 F (36.8 C) (05/26 0751) Temp Source: Oral (05/26 0751) BP: 102/81 (05/26 0751) Pulse Rate: 109 (05/26 0751)  Labs: Recent Labs    10/04/19 1041 10/04/19 1041 10/04/19 1205 10/04/19 1711 10/05/19 0235 10/05/19 0509 10/05/19 0940 10/06/19 0618  HGB 13.3   < >  --   --   --  14.6  --  13.4  HCT 41.2  --   --   --   --  45.2  --  41.6  PLT 286  --   --   --   --  303  --  272  APTT  --   --   --  27  --   --   --   --   LABPROT  --   --   --  14.1  --   --   --   --   INR  --   --   --  1.1  --   --   --   --   HEPARINUNFRC  --   --   --   --  0.63  --  0.43 0.44  CREATININE 0.96  --   --   --   --  1.27*  --   --   TROPONINIHS 22*  --  23*  --   --   --   --   --    < > = values in this interval not displayed.    Estimated Creatinine Clearance: 81.4 mL/min (A) (by C-G formula based on SCr of 1.27 mg/dL (H)).   Assessment:  Anticoag: +LLL PE (RV/LV ratio 1.1) HL 0.44 in goal. CBC WNL. Dopplers negative for DVT  Goal of Therapy:  Heparin level 0.3-0.7 units/ml Monitor platelets by anticoagulation protocol: Yes   Plan:  Con't IV heparin at 1300 units/hr Daily HL and CBC F/u insurance coverage and transition to [place]. Alford Highland, PharmD, BCPS Clinical Staff Pharmacist [email or web address] Alford Highland, [place] 10/06/2019,8:00 AM

## 2019-10-06 NOTE — Plan of Care (Signed)
  Problem: Education: Goal: Knowledge of General Education information will improve Description: Including pain rating scale, medication(s)/side effects and non-pharmacologic comfort measures Outcome: Progressing   Problem: Health Behavior/Discharge Planning: Goal: Ability to manage health-related needs will improve Outcome: Progressing   Problem: Clinical Measurements: Goal: Ability to maintain clinical measurements within normal limits will improve Outcome: Progressing Goal: Will remain free from infection Outcome: Progressing Goal: Cardiovascular complication will be avoided Outcome: Progressing   Problem: Activity: Goal: Risk for activity intolerance will decrease Outcome: Progressing   Problem: Nutrition: Goal: Adequate nutrition will be maintained Outcome: Progressing   Problem: Elimination: Goal: Will not experience complications related to bowel motility Outcome: Progressing Goal: Will not experience complications related to urinary retention Outcome: Progressing   Problem: Pain Managment: Goal: General experience of comfort will improve Outcome: Progressing   Problem: Safety: Goal: Ability to remain free from injury will improve Outcome: Progressing   Problem: Skin Integrity: Goal: Risk for impaired skin integrity will decrease Outcome: Progressing   Problem: Education: Goal: Ability to verbalize understanding of medication therapies will improve Outcome: Progressing

## 2019-10-06 NOTE — Progress Notes (Signed)
Progress Note  Patient Name: Robert [name] Date of Encounter: 10/06/2019  Primary Cardiologist: Skeet Latch, MD   Subjective   Feels well.  Breathing improving.  Unable to lay flat completely.  Inpatient Medications    Scheduled Meds: . digoxin  0.25 mg Oral Daily  . folic acid  1 mg Oral Daily  . furosemide  40 mg Intravenous BID  . metoprolol succinate  12.5 mg Oral Daily  . multivitamin with minerals  1 tablet Oral Daily  . potassium chloride  40 mEq Oral BID  . sodium chloride flush  3 mL Intravenous Once  . sodium chloride flush  3 mL Intravenous Q12H  . thiamine  100 mg Oral Daily   Or  . thiamine  100 mg Intravenous Daily   Continuous Infusions: . sodium chloride    . heparin 1,300 Units/hr (10/06/19 0715)   PRN Meds: sodium chloride, iohexol, LORazepam **OR** LORazepam, ondansetron **OR** ondansetron (ZOFRAN) IV, sodium chloride flush   Vital Signs    Vitals:   10/06/19 0045 10/06/19 0435 10/06/19 0500 10/06/19 0751  BP: 103/82 94/81 99/84  102/81  Pulse: (!) 109 (!) 111 (!) 108 (!) 109  Resp: 18 18 18 16   Temp: 98.7 F (37.1 C) 97.6 F (36.4 C) 98 F (36.7 C) 98.2 F (36.8 C)  TempSrc: Oral Oral Oral Oral  SpO2: 99% 100% 100% 99%  Weight:  82.1 kg    Height:        Intake/Output Summary (Last 24 hours) at 10/06/2019 0951 Last data filed at 10/06/2019 0807 Gross per 24 hour  Intake 1431.84 ml  Output 1850 ml  Net -418.16 ml   Last 3 Weights 10/06/2019 10/05/2019 10/04/2019  Weight (lbs) 180 lb 14.4 oz 181 lb 3.2 oz 180 lb  Weight (kg) 82.056 kg 82.192 kg 81.647 kg      Telemetry    Sinus tachycardia.  Heart rate 90s to 110s.- Personally Reviewed  ECG    n/a - Personally Reviewed  Physical Exam   VS:  BP 102/81 (BP Location: Left Arm)   Pulse (!) 109   Temp 98.2 F (36.8 C) (Oral)   Resp 16   Ht 5\' 10"  (1.778 m)   Wt 82.1 kg   SpO2 99%   BMI 25.96 kg/m  , BMI Body mass index is 25.96 kg/m. GENERAL:  Well appearing HEENT:  Pupils equal round and reactive, fundi not visualized, oral mucosa unremarkable NECK:  No jugular venous distention, waveform within normal limits, carotid upstroke brisk and symmetric, no bruits LUNGS:  Clear to auscultation bilaterally HEART:  RRR.  PMI not displaced or sustained,S1 and S2 within normal limits, no S3, no S4, no clicks, no rubs, no murmurs ABD:  Flat, positive bowel sounds normal in frequency in pitch, no bruits, no rebound, no guarding, no midline pulsatile mass, no hepatomegaly, no splenomegaly EXT:  2 plus pulses throughout, no edema, no cyanosis no clubbing SKIN:  No rashes no nodules NEURO:  Cranial nerves II through XII grossly intact, motor grossly intact throughout PSYCH:  Cognitively intact, oriented to person place and time   Labs    High Sensitivity Troponin:   Recent Labs  Lab 10/04/19 1041 10/04/19 1205  TROPONINIHS 22* 23*      Chemistry Recent Labs  Lab 10/04/19 1041 10/05/19 0509 10/06/19 0618  NA 138 138 137  K 3.5 3.7 3.3*  CL 107 102 95*  CO2 20* 28 28  GLUCOSE 120* 94 129*  BUN 19 19 16  CREATININE 0.96 1.27* 1.25*  CALCIUM 8.3* 8.7* 8.7*  GFRNONAA >60 >60 >60  GFRAA >60 >60 >60  ANIONGAP 11 8 14      Hematology Recent Labs  Lab 10/04/19 1041 10/05/19 0509 10/06/19 0618  WBC 5.2 5.7 4.9  RBC 4.45 4.91 4.53  HGB 13.3 14.6 13.4  HCT 41.2 45.2 41.6  MCV 92.6 92.1 91.8  MCH 29.9 29.7 29.6  MCHC 32.3 32.3 32.2  RDW 12.9 12.9 12.9  PLT 286 303 272    BNP Recent Labs  Lab 10/04/19 1041  BNP 2,614.9*     DDimer No results for input(s): DDIMER in the last 168 hours.   Radiology    DG Chest 2 View  Result Date: 10/04/2019 CLINICAL DATA:  Shortness of breath with intermittent chest pain EXAM: CHEST - 2 VIEW COMPARISON:  August 03, 2018 FINDINGS: There is cardiomegaly with pulmonary venous hypertension. There is mild atelectatic change in each mid lung. There is no frank edema or airspace opacity. No pleural effusions  evident. No adenopathy. No bone lesions. IMPRESSION: Cardiomegaly with pulmonary vascular congestion. Mild midlung atelectatic change. Lungs otherwise clear. No evident adenopathy. Electronically Signed   By: Lowella Grip III M.D.   On: 10/04/2019 10:21   CT ANGIO CHEST PE W OR WO CONTRAST  Result Date: 10/04/2019 CLINICAL DATA:  Intermittent chest pain and palpitations for the past week. EXAM: CT ANGIOGRAPHY CHEST WITH CONTRAST TECHNIQUE: Multidetector CT imaging of the chest was performed using the standard protocol during bolus administration of intravenous contrast. Multiplanar CT image reconstructions and MIPs were obtained to evaluate the vascular anatomy. CONTRAST:  168mL OMNIPAQUE IOHEXOL 350 MG/ML SOLN COMPARISON:  Chest x-ray from same day. FINDINGS: Cardiovascular: Satisfactory opacification of the pulmonary arteries to the segmental level. Acute nonocclusive lobar and segmental pulmonary emboli in the left lower lobe. No additional pulmonary emboli. Moderate cardiomegaly. Elevated RV/LV ratio measuring 1.1. Small to moderate pericardial effusion. No thoracic aortic aneurysm. Reflux of contrast into the hepatic veins. Mediastinum/Nodes: Prominent subcentimeter mediastinal and hilar lymph nodes are likely reactive. No enlarged axillary lymph nodes. The thyroid gland, trachea, and esophagus demonstrate no significant findings. Lungs/Pleura: Small right and trace left pleural effusions. Scattered mild interlobular septal thickening. Subsegmental atelectasis in both lower lobes with more focal patchy opacity and small pneumatocele in the medial aspect of the left lower lobe. No pneumothorax. Upper Abdomen: No acute abnormality. Musculoskeletal: No chest wall abnormality. No acute or significant osseous findings. Review of the MIP images confirms the above findings. IMPRESSION: 1. Acute nonocclusive lobar and segmental pulmonary emboli in the left lower lobe. Positive for acute PE with CT evidence of  right heart strain (RV/LV Ratio = 1.1) consistent with at least submassive (intermediate risk) PE. The presence of right heart strain has been associated with an increased risk of morbidity and mortality. 2. Moderate cardiomegaly with small to moderate pericardial effusion, small right and trace left pleural effusions, and mild interstitial pulmonary edema, consistent with congestive heart failure. 3. Subsegmental atelectasis in both lower lobes with more focal patchy opacity and small pneumatocele in the medial aspect of the left lower lobe that may be postinfectious or postinflammatory scarring. Given the patient's smoking history, follow-up chest CT in 3 months is recommended. Critical Value/emergent results were called by telephone at the time of interpretation on 10/04/2019 at 4:08 pm to provider [facility] , who verbally acknowledged these results. Electronically Signed   By: Titus Dubin M.D.   On: 10/04/2019 16:12   ECHOCARDIOGRAM  COMPLETE  Result Date: 10/05/2019    ECHOCARDIOGRAM REPORT   Patient Name:   Robert [name] Date of Exam: 10/05/2019 Medical Rec #:  [id number]    Height:       70.0 in Accession #:    [id number]   Weight:       180.0 lb Date of Birth:  05/19/1980     BSA:          1.996 m Patient Age:    25 years     BP:           110/81 mmHg Patient Gender: M            HR:           113 bpm. Exam Location:  Inpatient Procedure: 2D Echo, Cardiac Doppler and Color Doppler Indications:    Dyspnea  History:        Patient has no prior history of Echocardiogram examinations.                 Arrythmias:sinus tachycardia, Signs/Symptoms:Dyspnea and Chest                 Pain; Risk Factors:Current Smoker. Pulmonary emboli.  Sonographer:    Dustin Flock Referring Phys: [phone] Monie Shere  IMPRESSIONS  1. Left ventricular ejection fraction, by estimation, is <20%. The left ventricle has severely decreased function. The left ventricle demonstrates global hypokinesis. The left ventricular  internal cavity size was moderately to severely dilated. Prominent trabeculation, consider LV noncompaction (could assess more closely with cardiac MRI). Left ventricular diastolic parameters are consistent with Grade III diastolic dysfunction (restrictive). Elevated left ventricular end-diastolic pressure.  2. Right ventricular systolic function is moderately reduced. The right ventricular size is mildly enlarged. There is mildly elevated pulmonary artery systolic pressure. The estimated right ventricular systolic pressure is 64.4 mmHg.  3. Left atrial size was moderately dilated.  4. Right atrial size was mildly dilated.  5. The mitral valve is normal in structure. Mild mitral valve regurgitation. No evidence of mitral stenosis.  6. The aortic valve is tricuspid. Aortic valve regurgitation is not visualized. No aortic stenosis is present.  7. The inferior vena cava is dilated in size with <50% respiratory variability, suggesting right atrial pressure of 15 mmHg. FINDINGS  Left Ventricle: Left ventricular ejection fraction, by estimation, is <20%. The left ventricle has severely decreased function. The left ventricle demonstrates global hypokinesis. The left ventricular internal cavity size was moderately to severely dilated. There is no left ventricular hypertrophy. Left ventricular diastolic parameters are consistent with Grade III diastolic dysfunction (restrictive). Elevated left ventricular end-diastolic pressure. Right Ventricle: The right ventricular size is mildly enlarged. No increase in right ventricular wall thickness. Right ventricular systolic function is moderately reduced. There is mildly elevated pulmonary artery systolic pressure. The tricuspid regurgitant velocity is 2.41 m/s, and with an assumed right atrial pressure of 15 mmHg, the estimated right ventricular systolic pressure is 03.4 mmHg. Left Atrium: Left atrial size was moderately dilated. Right Atrium: Right atrial size was mildly dilated.  Prominent Chiari network. Pericardium: A small pericardial effusion is present. There is no evidence of cardiac tamponade. Mitral Valve: The mitral valve is normal in structure. Mild mitral valve regurgitation. No evidence of mitral valve stenosis. Tricuspid Valve: The tricuspid valve is normal in structure. Tricuspid valve regurgitation is trivial. Aortic Valve: The aortic valve is tricuspid. Aortic valve regurgitation is not visualized. No aortic stenosis is present. Pulmonic Valve: The pulmonic valve was normal in structure. Pulmonic valve  regurgitation is trivial. Aorta: The aortic root is normal in size and structure. Venous: The inferior vena cava is dilated in size with less than 50% respiratory variability, suggesting right atrial pressure of 15 mmHg. IAS/Shunts: No atrial level shunt detected by color flow Doppler.  LEFT VENTRICLE PLAX 2D LVIDd:         7.00 cm  Diastology LVIDs:         6.30 cm  LV e' lateral:   4.90 cm/s LV PW:         1.00 cm  LV E/e' lateral: 19.1 LV IVS:        1.00 cm  LV e' medial:    3.56 cm/s LVOT diam:     2.70 cm  LV E/e' medial:  26.2 LV SV:         51 LV SV Index:   25 LVOT Area:     5.73 cm  RIGHT VENTRICLE RV Basal diam:  2.50 cm RV S prime:     7.29 cm/s TAPSE (M-mode): 2.4 cm LEFT ATRIUM              Index       RIGHT ATRIUM           Index LA diam:        4.90 cm  2.46 cm/m  RA Area:     22.90 cm LA Vol (A2C):   71.0 ml  35.57 ml/m RA Volume:   69.10 ml  34.62 ml/m LA Vol (A4C):   109.0 ml 54.61 ml/m LA Biplane Vol: 96.8 ml  48.50 ml/m  AORTIC VALVE LVOT Vmax:   64.60 cm/s LVOT Vmean:  43.500 cm/s LVOT VTI:    0.088 m  AORTA Ao Root diam: 3.10 cm MITRAL VALVE               TRICUSPID VALVE MV Area (PHT): 7.16 cm    TR Peak grad:   23.2 mmHg MV Decel Time: 106 msec    TR Vmax:        241.00 cm/s MV E velocity: 93.40 cm/s MV A velocity: 30.00 cm/s  SHUNTS MV E/A ratio:  3.11        Systemic VTI:  0.09 m                            Systemic Diam: 2.70 cm Loralie Champagne  MD Electronically signed by Loralie Champagne MD Signature Date/Time: 10/05/2019/10:21:58 AM    Final    VAS [place] LOWER EXTREMITY VENOUS (DVT)  Result Date: 10/05/2019  Lower Venous DVTStudy Indications: Pulmonary embolism, and CHF.  Comparison Study: none Performing Technologist: June Leap RDMS, RVT  Examination Guidelines: A complete evaluation includes B-mode imaging, spectral Doppler, color Doppler, and power Doppler as needed of all accessible portions of each vessel. Bilateral testing is considered an integral part of a complete examination. Limited examinations for reoccurring indications may be performed as noted. The reflux portion of the exam is performed with the patient in reverse Trendelenburg.  +---------+---------------+---------+-----------+----------+--------------+ RIGHT    CompressibilityPhasicitySpontaneityPropertiesThrombus Aging +---------+---------------+---------+-----------+----------+--------------+ CFV      Full           Yes      Yes                                 +---------+---------------+---------+-----------+----------+--------------+ SFJ      Full                                                        +---------+---------------+---------+-----------+----------+--------------+  FV Prox  Full                                                        +---------+---------------+---------+-----------+----------+--------------+ FV Mid   Full                                                        +---------+---------------+---------+-----------+----------+--------------+ FV DistalFull                                                        +---------+---------------+---------+-----------+----------+--------------+ PFV      Full                                                        +---------+---------------+---------+-----------+----------+--------------+ POP      Full           Yes      Yes                                  +---------+---------------+---------+-----------+----------+--------------+ PTV      Full                                                        +---------+---------------+---------+-----------+----------+--------------+ PERO     Full                                                        +---------+---------------+---------+-----------+----------+--------------+   +---------+---------------+---------+-----------+----------+--------------+ LEFT     CompressibilityPhasicitySpontaneityPropertiesThrombus Aging +---------+---------------+---------+-----------+----------+--------------+ CFV      Full           Yes      Yes                                 +---------+---------------+---------+-----------+----------+--------------+ SFJ      Full                                                        +---------+---------------+---------+-----------+----------+--------------+ FV Prox  Full                                                        +---------+---------------+---------+-----------+----------+--------------+  FV Mid   Full                                                        +---------+---------------+---------+-----------+----------+--------------+ FV DistalFull                                                        +---------+---------------+---------+-----------+----------+--------------+ PFV      Full                                                        +---------+---------------+---------+-----------+----------+--------------+ POP      Full           Yes      Yes                                 +---------+---------------+---------+-----------+----------+--------------+ PTV      Full                                                        +---------+---------------+---------+-----------+----------+--------------+ PERO     Full                                                         +---------+---------------+---------+-----------+----------+--------------+     Summary: BILATERAL: - No evidence of deep vein thrombosis seen in the lower extremities, bilaterally. -No evidence of popliteal cyst, bilaterally.   *See table(s) above for measurements and observations. Electronically signed by Deitra Mayo MD on 10/05/2019 at 3:28:35 PM.    Final     Cardiac Studies   Echo 10/05/19 1. Left ventricular ejection fraction, by estimation, is <20%. The left  ventricle has severely decreased function. The left ventricle demonstrates  global hypokinesis. The left ventricular internal cavity size was  moderately to severely dilated.  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 Left ventricular diastolic parameters are  consistent with Grade III diastolic dysfunction (restrictive). Elevated  left ventricular end-diastolic pressure.  2. Right ventricular systolic function is moderately reduced. The right  ventricular size is mildly enlarged. There is mildly elevated pulmonary  artery systolic pressure. The estimated right ventricular systolic  pressure is 54.2 mmHg.  3. Left atrial size was moderately dilated.  4. Right atrial size was mildly dilated.  5. The mitral valve is normal in structure. Mild mitral valve  regurgitation. No evidence of mitral stenosis.  6. The aortic valve is tricuspid. Aortic valve regurgitation is not  visualized. No aortic stenosis is present.  7. The inferior vena cava is dilated in size with <50% respiratory  variability, suggesting right  atrial pressure of 15 mmHg.   LE Doppler: NO DVT  Patient Profile     Robert [name] is a 39M with tobacco abuse, cocaine abuse, and asthma admitted with new onset heart failure  Assessment & Plan    # Acute systolic and diastolic heart failure: # Chest pain:  #Demand ischemia: #Cardiomegaly: #Palpitations: Robert [name] has new onset heart failure.   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  His mother was in the room and notes that he drinks far more than he should.  She is also been concerned about his substance abuse.  He initially reported drinking too mini bottles for 5 times per week.  In her presence he actually admits to drinking 2 1/5 bottles 4-5 times per week.  He notes that in the past he had withdrawal symptoms if he did not drink but this has not been an issue lately.  He has been doing this for several years.  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  We discussed the importance of cessation of alcohol.  He did have some cold symptoms prior to the onset of the symptoms.  If the cath is negative today we will plan for a cardiac MRI tomorrow to better assess for noncompaction or myopericarditis given his chest pain elevated troponin on admission.  Blood pressure was initially low and he was quite tachycardic.  He was started on low-dose digoxin and has diuresed excessively.  He seems to be pretty euvolemic now.  Renal function is slightly higher.  We will stop IV Lasix and wait for direction on further diuresis from his right heart cath.  Start metoprolol succinate 12.5 mg daily.  We will try to add an ARB as blood pressure and renal function permit.  Risks and benefits of cardiac catheterization have been discussed with the patient.  The patient understands that risks included but are not limited to stroke (1 in 1000), death (1 in 31), kidney failure [usually temporary] (1 in 500), bleeding (1 in 200), allergic reaction [possibly serious] (1 in 200). The patient understands and agrees to proceed.   # PE:  Small.  No DVT on lower extremity Dopplers.  Continue heparin and transition to oral agent after cath.  # Polysubstance abuse: Importance of alcohol and substance abuse cessation was stressed.  We will place social work consult.  Watch for signs of withdrawal,  though he seems okay right now.   For questions or updates, please contact [place] Please consult [email or web address] for contact info under        Signed, Skeet Latch, MD  10/06/2019, 9:51 AM

## 2019-10-07 ENCOUNTER — Inpatient Hospital Stay (HOSPITAL_COMMUNITY): Payer: BC Managed Care – PPO

## 2019-10-07 DIAGNOSIS — N179 Acute kidney failure, unspecified: Secondary | ICD-10-CM

## 2019-10-07 DIAGNOSIS — I5041 Acute combined systolic (congestive) and diastolic (congestive) heart failure: Secondary | ICD-10-CM

## 2019-10-07 DIAGNOSIS — I5021 Acute systolic (congestive) heart failure: Secondary | ICD-10-CM

## 2019-10-07 LAB — CBC
HCT: 43.2 % (ref 39.0–52.0)
Hemoglobin: 13.8 g/dL (ref 13.0–17.0)
MCH: 30.2 pg (ref 26.0–34.0)
MCHC: 31.9 g/dL (ref 30.0–36.0)
MCV: 94.5 fL (ref 80.0–100.0)
Platelets: 275 10*3/uL (ref 150–400)
RBC: 4.57 MIL/uL (ref 4.22–5.81)
RDW: 12.9 % (ref 11.5–15.5)
WBC: 5.6 10*3/uL (ref 4.0–10.5)
nRBC: 0 % (ref 0.0–0.2)

## 2019-10-07 LAB — BASIC METABOLIC PANEL
Anion gap: 15 (ref 5–15)
BUN: 18 mg/dL (ref 6–20)
CO2: 24 mmol/L (ref 22–32)
Calcium: 8.7 mg/dL — ABNORMAL LOW (ref 8.9–10.3)
Chloride: 99 mmol/L (ref 98–111)
Creatinine, Ser: 1.39 mg/dL — ABNORMAL HIGH (ref 0.61–1.24)
GFR calc Af Amer: >60 mL/min (ref >60)
GFR calc non Af Amer: >60 mL/min (ref >60)
Glucose, Bld: 116 mg/dL — ABNORMAL HIGH (ref 70–99)
Potassium: 4 mmol/L (ref 3.5–5.1)
Sodium: 138 mmol/L (ref 135–145)

## 2019-10-07 LAB — GLUCOSE, CAPILLARY: Glucose-Capillary: 111 mg/dL — ABNORMAL HIGH (ref 70–99)

## 2019-10-07 LAB — HEPARIN LEVEL (UNFRACTIONATED): Heparin Unfractionated: 0.15 IU/mL — ABNORMAL LOW (ref 0.30–0.70)

## 2019-10-07 MED ORDER — SODIUM CHLORIDE 0.9% FLUSH: 3.0000 mL | Freq: Two times a day (BID) | INTRAVENOUS | Status: DC

## 2019-10-07 MED ORDER — SODIUM CHLORIDE 0.9 % IV SOLN
250.0000 mL | INTRAVENOUS | Status: DC | PRN
Start: 2019-10-07 — End: 2019-10-07

## 2019-10-07 MED ORDER — NITROGLYCERIN 0.4 MG SL SUBL
SUBLINGUAL_TABLET | SUBLINGUAL | Status: AC
  Filled 2019-10-07: qty 1

## 2019-10-07 MED ORDER — SODIUM CHLORIDE 0.9% FLUSH: 3.0000 mL | INTRAVENOUS | Status: DC | PRN

## 2019-10-07 MED ORDER — RIVAROXABAN 20 MG PO TABS: 20.0000 mg | ORAL_TABLET | Freq: Every day | ORAL | Status: DC

## 2019-10-07 MED ORDER — GADOBUTROL 1 MMOL/ML IV SOLN
8.0000 mL | Freq: Once | INTRAVENOUS | Status: AC | PRN
  Administered 2019-10-07: 8 mL via INTRAVENOUS

## 2019-10-07 MED ORDER — MAGNESIUM SULFATE 2 GM/50ML IV SOLN
2.0000 g | Freq: Once | INTRAVENOUS | Status: AC
  Administered 2019-10-07: 2 g via INTRAVENOUS
  Filled 2019-10-07: qty 50

## 2019-10-07 MED ORDER — FUROSEMIDE 10 MG/ML IJ SOLN
40.0000 mg | Freq: Two times a day (BID) | INTRAMUSCULAR | Status: DC
  Administered 2019-10-07 – 2019-10-10 (×6): 40 mg via INTRAVENOUS
  Filled 2019-10-07 (×6): qty 4

## 2019-10-07 MED ORDER — RIVAROXABAN 15 MG PO TABS
15.0000 mg | ORAL_TABLET | Freq: Two times a day (BID) | ORAL | Status: DC
  Administered 2019-10-07 – 2019-10-13 (×13): 15 mg via ORAL
  Filled 2019-10-07 (×12): qty 1

## 2019-10-07 NOTE — Progress Notes (Signed)
Transitions of Care Pharmacist Note  Robert [name] is a 39 y.o. male that has been diagnosed with PE and will be prescribed Xarelto (rivaroxaban) at discharge.   Patient Education: I provided the following education on 10/07/2019 to the patient and family member: How to take the medication Described what the medication is Signs of bleeding Answered their questions  Discharge Medications Plan: The patient wants to have their discharge medications filled by the Transitions of Care pharmacy rather than their usual pharmacy.  The discharge orders pharmacy has been changed to the Transitions of Care pharmacy, the patient will receive a phone call regarding co-pay, and their medications will be delivered by the Transitions of Care pharmacy.    Thank you,   Cristela Felt, PharmD PGY1 Pharmacy Resident [employer]: [phone]  Oct 07, 2019

## 2019-10-07 NOTE — Discharge Instructions (Addendum)

## 2019-10-07 NOTE — Progress Notes (Addendum)
Progress Note  Patient Name: Robert [name] Date of Encounter: 10/07/2019  Primary Cardiologist: Skeet Latch, MD   Subjective   Feels well.  Breathing improving.   Inpatient Medications    Scheduled Meds: . digoxin  0.25 mg Oral Daily  . folic acid  1 mg Oral Daily  . metoprolol succinate  12.5 mg Oral Daily  . multivitamin with minerals  1 tablet Oral Daily  . nitroGLYCERIN      . sodium chloride flush  3 mL Intravenous Once  . sodium chloride flush  3 mL Intravenous Q12H  . sodium chloride flush  3 mL Intravenous Q12H  . thiamine  100 mg Oral Daily   Or  . thiamine  100 mg Intravenous Daily   Continuous Infusions: . sodium chloride    . sodium chloride    . heparin 1,300 Units/hr (10/07/19 0500)   PRN Meds: sodium chloride, sodium chloride, acetaminophen, diazepam, iohexol, LORazepam **OR** LORazepam, ondansetron (ZOFRAN) IV, ondansetron **OR** [DISCONTINUED] ondansetron (ZOFRAN) IV, sodium chloride flush, sodium chloride flush   Vital Signs    Vitals:   10/07/19 0511 10/07/19 0513 10/07/19 0746 10/07/19 0958  BP:  (!) 106/92 99/78 111/78  Pulse:  (!) 115 (!) 117 (!) 115  Resp:  19 18 15   Temp:  (!) 97.5 F (36.4 C) 98.3 F (36.8 C)   TempSrc:  Oral Oral   SpO2:  100% 100% 100%  Weight: 82.3 kg     Height:        Intake/Output Summary (Last 24 hours) at 10/07/2019 1007 Last data filed at 10/07/2019 0837 Gross per 24 hour  Intake 1601.5 ml  Output 1200 ml  Net 401.5 ml   Last 3 Weights 10/07/2019 10/07/2019 10/06/2019  Weight (lbs) 181 lb 8 oz 180 lb 8 oz 180 lb 14.4 oz  Weight (kg) 82.328 kg 81.874 kg 82.056 kg      Telemetry    Sinus tachycardia.  Heart rate 100s to 110s.- Personally Reviewed  ECG    n/a - Personally Reviewed  Physical Exam   VS:  BP 111/78 (BP Location: Left Arm)   Pulse (!) 115   Temp 98.3 F (36.8 C) (Oral)   Resp 15   Ht 5\' 10"  (1.778 m)   Wt 82.3 kg   SpO2 100%   BMI 26.04 kg/m  , BMI Body mass index is 26.04  kg/m. GENERAL:  Well appearing HEENT: Pupils equal round and reactive, fundi not visualized, oral mucosa unremarkable NECK:  + jugular venous distention, waveform within normal limits, carotid upstroke brisk and symmetric, no bruits LUNGS:   HEART:  RRR.  PMI not displaced or sustained,S1 and S2 within normal limits, no S3, no S4, no clicks, no rubs, no murmurs ABD:  Flat, positive bowel sounds normal in frequency in pitch, no bruits, no rebound, no guarding, no midline pulsatile mass, no hepatomegaly, no splenomegaly EXT:  2 plus pulses throughout, trace LE edema, no cyanosis no clubbing SKIN:  No rashes no nodules NEURO:  Cranial nerves II through XII grossly intact, motor grossly intact throughout PSYCH:  Cognitively intact, oriented to person place and time   Labs    High Sensitivity Troponin:   Recent Labs  Lab 10/04/19 1041 10/04/19 1205  TROPONINIHS 22* 23*      Chemistry Recent Labs  Lab 10/05/19 0509 10/05/19 0509 10/06/19 0618 10/06/19 1159 10/06/19 1200 10/06/19 1212 10/07/19 0433  NA 138   < > 137   < > 136 136 138  K 3.7   < > 3.3*   < > 4.0 3.7 4.0  CL 102  --  95*  --   --   --  99  CO2 28  --  28  --   --   --  24  GLUCOSE 94  --  129*  --   --   --  116*  BUN 19  --  16  --   --   --  18  CREATININE 1.27*  --  1.25*  --   --   --  1.39*  CALCIUM 8.7*  --  8.7*  --   --   --  8.7*  GFRNONAA >60  --  >60  --   --   --  >60  GFRAA >60  --  >60  --   --   --  >60  ANIONGAP 8  --  14  --   --   --  15   < > = values in this interval not displayed.     Hematology Recent Labs  Lab 10/05/19 0509 10/05/19 0509 10/06/19 0618 10/06/19 1159 10/06/19 1200 10/06/19 1212 10/07/19 0433  WBC 5.7  --  4.9  --   --   --  5.6  RBC 4.91  --  4.53  --   --   --  4.57  HGB 14.6   < > 13.4   < > 14.3 13.9 13.8  HCT 45.2   < > 41.6   < > 42.0 41.0 43.2  MCV 92.1  --  91.8  --   --   --  94.5  MCH 29.7  --  29.6  --   --   --  30.2  MCHC 32.3  --  32.2  --   --    --  31.9  RDW 12.9  --  12.9  --   --   --  12.9  PLT 303  --  272  --   --   --  275   < > = values in this interval not displayed.    BNP Recent Labs  Lab 10/04/19 1041  BNP 2,614.9*     DDimer No results for input(s): DDIMER in the last 168 hours.   Radiology    CARDIAC CATHETERIZATION  Result Date: 10/06/2019 Moderate elevation of right heart pressures. Moderate pulmonary venous hypertension with a mean PA pressure at 37 mmHg; PVR 2.7 WU Normal coronary arteries with a dominant RCA. RECOMMENDATION: Guideline directed medical therapy for severe LV dysfunction.  To initiate heparin therapy following the procedure later today with plans to transition to oral anticoagulation with his pulmonary embolism tomorrow.  VAS [place] LOWER EXTREMITY VENOUS (DVT)  Result Date: 10/05/2019  Lower Venous DVTStudy Indications: Pulmonary embolism, and CHF.  Comparison Study: none Performing Technologist: June Leap RDMS, RVT  Examination Guidelines: A complete evaluation includes B-mode imaging, spectral Doppler, color Doppler, and power Doppler as needed of all accessible portions of each vessel. Bilateral testing is considered an integral part of a complete examination. Limited examinations for reoccurring indications may be performed as noted. The reflux portion of the exam is performed with the patient in reverse Trendelenburg.  +---------+---------------+---------+-----------+----------+--------------+ RIGHT    CompressibilityPhasicitySpontaneityPropertiesThrombus Aging +---------+---------------+---------+-----------+----------+--------------+ CFV      Full           Yes      Yes                                 +---------+---------------+---------+-----------+----------+--------------+  SFJ      Full                                                        +---------+---------------+---------+-----------+----------+--------------+ FV Prox  Full                                                         +---------+---------------+---------+-----------+----------+--------------+ FV Mid   Full                                                        +---------+---------------+---------+-----------+----------+--------------+ FV DistalFull                                                        +---------+---------------+---------+-----------+----------+--------------+ PFV      Full                                                        +---------+---------------+---------+-----------+----------+--------------+ POP      Full           Yes      Yes                                 +---------+---------------+---------+-----------+----------+--------------+ PTV      Full                                                        +---------+---------------+---------+-----------+----------+--------------+ PERO     Full                                                        +---------+---------------+---------+-----------+----------+--------------+   +---------+---------------+---------+-----------+----------+--------------+ LEFT     CompressibilityPhasicitySpontaneityPropertiesThrombus Aging +---------+---------------+---------+-----------+----------+--------------+ CFV      Full           Yes      Yes                                 +---------+---------------+---------+-----------+----------+--------------+ SFJ      Full                                                        +---------+---------------+---------+-----------+----------+--------------+  FV Prox  Full                                                        +---------+---------------+---------+-----------+----------+--------------+ FV Mid   Full                                                        +---------+---------------+---------+-----------+----------+--------------+ FV DistalFull                                                         +---------+---------------+---------+-----------+----------+--------------+ PFV      Full                                                        +---------+---------------+---------+-----------+----------+--------------+ POP      Full           Yes      Yes                                 +---------+---------------+---------+-----------+----------+--------------+ PTV      Full                                                        +---------+---------------+---------+-----------+----------+--------------+ PERO     Full                                                        +---------+---------------+---------+-----------+----------+--------------+     Summary: BILATERAL: - No evidence of deep vein thrombosis seen in the lower extremities, bilaterally. -No evidence of popliteal cyst, bilaterally.   *See table(s) above for measurements and observations. Electronically signed by Deitra Mayo MD on 10/05/2019 at 3:28:35 PM.    Final     Cardiac Studies   Echo 10/05/19 1. Left ventricular ejection fraction, by estimation, is <20%. The left  ventricle has severely decreased function. The left ventricle demonstrates  global hypokinesis. The left ventricular internal cavity size was  moderately to severely dilated.  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 Left ventricular diastolic parameters are  consistent with Grade III diastolic dysfunction (restrictive). Elevated  left ventricular end-diastolic pressure.  2. Right ventricular systolic function is moderately reduced. The right  ventricular size is mildly enlarged. There is mildly elevated pulmonary  artery systolic pressure. The estimated right ventricular systolic  pressure is 28.4 mmHg.  3. Left atrial size was moderately dilated.  4. Right  atrial size was mildly dilated.  5. The mitral valve is normal in structure. Mild mitral valve  regurgitation. No  evidence of mitral stenosis.  6. The aortic valve is tricuspid. Aortic valve regurgitation is not  visualized. No aortic stenosis is present.  7. The inferior vena cava is dilated in size with <50% respiratory  variability, suggesting right atrial pressure of 15 mmHg.   LE Doppler: NO DVT  RHC/LHC 10/06/19: Right Heart Pressures RA: A-wave 14, V wave 11; mean 10 RV: 45/15 PA: 47/27; mean 37 PW: A-wave 27, V wave 25; mean 24  Initial AO: 86/69          LV: 84/14  LV: 86/14 AO: 86/65  Oxygen saturation in the PA 70% and in the AO 100%  Fick cardiac output 4.9 L/min with cardiac index 2.4 L/min/m.  PVR: 2.7 WU  AO: 86/65   Normal coronary arteries  Patient Profile     Mr. Aken is a 21M with tobacco abuse, cocaine abuse, and asthma admitted with new onset heart failure  Assessment & Plan    # Acute systolic and diastolic heart failure: # Chest pain:  #Demand ischemia: #Cardiomegaly: #Palpitations: Mr. Ferrelli has new onset heart failure.  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  His mother was in the room and notes that he drinks far more than he should.  She is also been concerned about his substance abuse.  He initially reported drinking too mini bottles for 5 times per week.  In her presence he actually admited to drinking 2, 1/5 bottles 4-5 times per week.  He notes that in the past he had withdrawal symptoms if he did not drink but this has not been an issue lately.  He has been doing this for several years.  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  We discussed the importance of cessation of alcohol.  He did have some cold symptoms prior to the onset of the symptoms.  Left heart cath was negative for coronary disease.  He is going for a cardiac MRI today.  He continues to be tachycardic.  He was unable to get metoprolol yesterday due to hypotension.  I do not feel comfortable adding ACE  inhibitors/ARB or hydralazine/nitrates due to his low blood pressures.  Would consider ivabradine, however ideally he would be on guideline directed medical therapy first.  He was started on digoxin and blood pressure still remain low.  Pulmonary pressures were elevated and right heart cath.  This may be to smoking or his pulmonary embolism.  We have asked the advanced heart failure team to see him today.  He is little volume overloaded still but renal function is worsening with diuresis.  He would not be a good candidate for home inotropes at this point but may need some while in the it will to adequately diurese him.  # PE:  Small.  No DVT on lower extremity Dopplers.  We will switch from heparin to Xarelto today given that he will need any other procedures.  # Polysubstance abuse: Importance of alcohol and substance abuse cessation was stressed.  We will place social work consult.  Watch for signs of withdrawal, though he seems okay right now.   For questions or updates, please contact [facility] Please consult [email or web address] for contact info under    Time spent: 45 minutes-Greater than 50% of this time was spent in counseling, explanation of diagnosis, planning of further  management, and coordination of care.     Signed, Skeet Latch, MD  10/07/2019, 10:07 AM

## 2019-10-07 NOTE — Consult Note (Addendum)
Advanced Heart Failure Team Consult Note   Primary Physician: Patient, No Pcp Per PCP-Cardiologist:  Skeet Latch, MD  Reason for Consultation: Heart Failure   HPI:    Garl Speigner is seen today for evaluation of heart failure  at the request of Dr Oval Linsey.   Mr Laufer is a 39 year old with a history of tobacco abuse, cocaine abuse, ETOH abuse, and asthma. Last used cocaine a couple weeks ago. Drinks 2 at least 2 beers every night and about 12-24 over the weekend. Says he has been drinking alcohol daily for > 10 years.     No recent long road or air trips. Denies recent viruses. Over the 4 weeks he developed increased shortness of breath. Works full time at [employer] in [place] and says he noticed increased fatigue and shortness of breath. Says it took him twice as long to change  4 tires.   Presented to [facility] ED with increased shortness of breath. CXR with cardiomegaly and vascular congestion. CTA with nonocclusive PE. IR consulted. [place] negative for DVT. Started on heparin drip. ECHO showed severely reduced LV and moderately reduced RV.   Cardiology consulted for acute systolic heart. He underwent RHC/LHC. Coronaries ok with preserved cardiac output and elevated filling pressures.   Last night acutely short of breath at rest. Given ativan with resolution. O2 sats ok. Feeling better today. Denies shortness of breath.   CTA Acute nonocclusive lobar and segmental pulmonary emboli in the left lower lobe. Positive for acute PE with CT evidence of right heart strain (RV/LV Ratio = 1.1) consistent with at least submassive (intermediate risk) PE. The presence of right heart strain has been associated with an increased risk of morbidity and mortality. 2. Moderate cardiomegaly with small to moderate pericardial effusion, small right and trace left pleural effusions, and mild interstitial pulmonary edema, consistent with congestive heart failure. 3. Subsegmental atelectasis in  both lower lobes with more focal patchy opacity and small pneumatocele in the medial aspect of the left lower lobe that may be postinfectious or postinflammatory scarring. Given the patient's smoking history, follow-up chest CT in 3 months is recommended.  RHC/LHC 10/06/19  Normal coronary arteries.  RA 10  PA 47/27 (24)  PWCP 24 PA sat 70% CO 4.9 CI 2.4 PVR 2.7 SVR 960    Echo EF < 20%   Review of Systems: [y] = yes, [ ]  = no   . General: Weight gain [ ] ; Weight loss [ ] ; Anorexia [ ] ; Fatigue [ Y]; Fever [ ] ; Chills [ ] ; Weakness [ Y]  . Cardiac: Chest pain/pressure [ ] ; Resting SOB [ ] ; Exertional SOB [ Y]; Orthopnea [ ] ; Pedal Edema [ ] ; Palpitations [ ] ; Syncope [ ] ; Presyncope [ ] ; Paroxysmal nocturnal dyspnea[ ]   . Pulmonary: Cough [ ] ; Wheezing[ ] ; Hemoptysis[ ] ; Sputum [ ] ; Snoring [ ]   . GI: Vomiting[ ] ; Dysphagia[ ] ; Melena[ ] ; Hematochezia [ ] ; Heartburn[ ] ; Abdominal pain [ ] ; Constipation [ ] ; Diarrhea [ ] ; BRBPR [ ]   . GU: Hematuria[ ] ; Dysuria [ ] ; Nocturia[ ]   . Vascular: Pain in legs with walking [ ] ; Pain in feet with lying flat [ ] ; Non-healing sores [ ] ; Stroke [ ] ; TIA [ ] ; Slurred speech [ ] ;  . Neuro: Headaches[ ] ; Vertigo[ ] ; Seizures[ ] ; Paresthesias[ ] ;Blurred vision [ ] ; Diplopia [ ] ; Vision changes [ ]   . Ortho/Skin: Arthritis [ ] ; Joint pain [ ] ; Muscle pain [ ] ; Joint swelling [ ] ;  Back Pain [ ] ; Rash [ ]   . Psych: Depression[ ] ; Anxiety[ ]   . Heme: Bleeding problems [ ] ; Clotting disorders [ ] ; Anemia [ ]   . Endocrine: Diabetes [ ] ; Thyroid dysfunction[ ]   Home Medications Prior to Admission medications   Medication Sig Start Date End Date Taking? Authorizing Provider  omeprazole (PRILOSEC) 20 MG capsule Take 1 capsule (20 mg total) by mouth daily. 08/03/19 09/02/19  Darr, Marguerita Beards, PA-C  ondansetron (ZOFRAN) 4 MG tablet Take 1 tablet (4 mg total) by mouth every 8 (eight) hours as needed for nausea or vomiting. Patient not taking: Reported on  10/04/2019 08/03/19   Darr, Marguerita Beards, PA-C  sucralfate (CARAFATE) 1 GM/10ML suspension Take 10 mLs (1 g total) by mouth 4 (four) times daily -  with meals and at bedtime. Patient not taking: Reported on 10/04/2019 08/03/19   Darr, Marguerita Beards, PA-C    Past Medical History: Past Medical History:  Diagnosis Date  . Asthma     Past Surgical History: Past Surgical History:  Procedure Laterality Date  . RIGHT/LEFT HEART CATH AND CORONARY ANGIOGRAPHY N/A 10/06/2019   Procedure: RIGHT/LEFT HEART CATH AND CORONARY ANGIOGRAPHY;  Surgeon: [name], MD;  Location: [facility];  Service: Cardiovascular;  Laterality: N/A;  . WRIST SURGERY      Family History: Family History  Problem Relation Age of Onset  . Heart failure Father   . Diabetes Father   . Kidney disease Father     Social History: Social History   Socioeconomic History  . Marital status: Single    Spouse name: Not on file  . Number of children: Not on file  . Years of education: Not on file  . Highest education level: Not on file  Occupational History  . Not on file  Tobacco Use  . Smoking status: Current Every Day Smoker    Packs/day: 0.50    Types: Cigarettes  . Smokeless tobacco: Never Used  Substance and Sexual Activity  . Alcohol use: Yes  . Drug use: Yes    Types: Marijuana  . Sexual activity: Not on file  Other Topics Concern  . Not on file  Social History Narrative  . Not on file   Social Determinants of Health   Financial Resource Strain:   . Difficulty of Paying Living Expenses:   Food Insecurity:   . Worried About Charity fundraiser in the Last Year:   . Arboriculturist in the Last Year:   Transportation Needs:   . Film/video editor (Medical):   [name] Lack of Transportation (Non-Medical):   Physical Activity:   . Days of Exercise per Week:   . Minutes of Exercise per Session:   Stress:   . Feeling of Stress :   Social Connections:   . Frequency of Communication with Friends and  Family:   . Frequency of Social Gatherings with Friends and Family:   . Attends Religious Services:   . Active Member of Clubs or Organizations:   . Attends Archivist Meetings:   [name] Marital Status:     Allergies:  No Known Allergies  Objective:    Vital Signs:   Temp:  [97.5 F (36.4 C)-98.3 F (36.8 C)] 98 F (36.7 C) (05/27 1105) Pulse Rate:  [107-127] 116 (05/27 1416) Resp:  [15-19] 16 (05/27 1416) BP: (94-113)/(70-92) 100/80 (05/27 1416) SpO2:  [97 %-100 %] 98 % (05/27 1416) Weight:  [81.9 kg-82.3 kg] 82.3 kg (05/27  0511) Last BM Date: 10/06/19  Weight change: Filed Weights   10/06/19 0435 10/07/19 0440 10/07/19 0511  Weight: 82.1 kg 81.9 kg 82.3 kg    Intake/Output:   Intake/Output Summary (Last 24 hours) at 10/07/2019 1555 Last data filed at 10/07/2019 1500 Gross per 24 hour  Intake 1874.99 ml  Output 800 ml  Net 1074.99 ml      Physical Exam    General:  Sitting on the side of the bed. No resp difficulty HEENT: normal Neck: supple. JVP to jaw . Carotids 2+ bilat; no bruits. No lymphadenopathy or thyromegaly appreciated. Cor: PMI nondisplaced. Tachy Regular rate & rhythm. No rubs, gallops or murmurs. Lungs: clear Abdomen: soft, nontender, nondistended. No hepatosplenomegaly. No bruits or masses. Good bowel sounds. Extremities: no cyanosis, clubbing, rash, edema Neuro: alert & orientedx3, cranial nerves grossly intact. moves all 4 extremities w/o difficulty. Affect pleasant   Telemetry   Sinus Tach 120s   EKG    Sinus tach 117 bpm   Labs   Basic Metabolic Panel: Recent Labs  Lab 10/04/19 1041 10/04/19 1041 10/05/19 0509 10/05/19 0509 10/06/19 0618 10/06/19 1159 10/06/19 1200 10/06/19 1212 10/07/19 0433  NA 138   < > 138   < > 137 137 136 136 138  K 3.5   < > 3.7   < > 3.3* 3.9 4.0 3.7 4.0  CL 107  --  102  --  95*  --   --   --  99  CO2 20*  --  28  --  28  --   --   --  24  GLUCOSE 120*  --  94  --  129*  --   --   --  116*   BUN 19  --  19  --  16  --   --   --  18  CREATININE 0.96  --  1.27*  --  1.25*  --   --   --  1.39*  CALCIUM 8.3*   < > 8.7*  --  8.7*  --   --   --  8.7*  MG  --   --  1.5*  --   --   --   --   --   --   PHOS  --   --  4.1  --   --   --   --   --   --    < > = values in this interval not displayed.    Liver Function Tests: No results for input(s): AST, ALT, ALKPHOS, BILITOT, PROT, ALBUMIN in the last 168 hours. No results for input(s): LIPASE, AMYLASE in the last 168 hours. No results for input(s): AMMONIA in the last 168 hours.  CBC: Recent Labs  Lab 10/04/19 1041 10/04/19 1041 10/05/19 0509 10/05/19 0509 10/06/19 0618 10/06/19 1159 10/06/19 1200 10/06/19 1212 10/07/19 0433  WBC 5.2  --  5.7  --  4.9  --   --   --  5.6  HGB 13.3   < > 14.6   < > 13.4 13.9 14.3 13.9 13.8  HCT 41.2   < > 45.2   < > 41.6 41.0 42.0 41.0 43.2  MCV 92.6  --  92.1  --  91.8  --   --   --  94.5  PLT 286  --  303  --  272  --   --   --  275   < > = values in this interval not  displayed.    Cardiac Enzymes: No results for input(s): CKTOTAL, CKMB, CKMBINDEX, TROPONINI in the last 168 hours.  BNP: BNP (last 3 results) Recent Labs    10/04/19 1041  BNP 2,614.9*    ProBNP (last 3 results) No results for input(s): PROBNP in the last 8760 hours.   CBG: Recent Labs  Lab 10/07/19 0408  GLUCAP 111*    Coagulation Studies: Recent Labs    10/04/19 1711  LABPROT 14.1  INR 1.1     Imaging    No results found.   Medications:     Current Medications: . digoxin  0.25 mg Oral Daily  . folic acid  1 mg Oral Daily  . metoprolol succinate  12.5 mg Oral Daily  . multivitamin with minerals  1 tablet Oral Daily  . nitroGLYCERIN      . rivaroxaban  15 mg Oral BID   Followed by  . [START ON 10/28/2019] rivaroxaban  20 mg Oral Daily  . sodium chloride flush  3 mL Intravenous Once  . sodium chloride flush  3 mL Intravenous Q12H  . sodium chloride flush  3 mL Intravenous Q12H  .  thiamine  100 mg Oral Daily   Or  . thiamine  100 mg Intravenous Daily     Infusions: . sodium chloride    . sodium chloride          Assessment/Plan  1. Acute Systolic Heart Failure, NICM  ECHO EF < 20% RV moderately reduced. Suspect ETOH playing a role.  -LHC with no coronary disease. RHC with preserved cardiac output and elevated filling pressures -Remains volume overloaded.  Restart 40 mg IV lasix twice daily.  - Continue digoxin  - Hold Toprol  - Add 12. 5 mg spiro daily - CMRI pending.   2. PUlmonary Emboli CTA- small PE. IR reviewed.  On heparin drip. Now on xarelto   3. Sinus Tachycardia  4. ETOH Abuse -Heavy drinker for many years. -On CIWA  - Refer to HFSW  For outpatient follow up.   5. H/O Asthma  Uses rescue inhaler but hasnt his inhaler recently.     6. H/O Cocaine Abuse Last used 2-3 weeks ago  7. Hypomagnesia Mag 1.5 - Give 2 gram mag now.   Lengthy discussion about heart failure. Refer to cardiac rehab.   Medication concerns reviewed with patient and pharmacy team. Barriers identified: Has insurance.   Length of Stay: 3  Amy Clegg, NP  10/07/2019, 3:55 PM  Advanced Heart Failure Team Pager [phone] (M-F; 7a - 4p)  Please contact [facility] for night-coverage after hours (4p -7a ) and weekends on [email or web address]  Patient seen and examined with the above-signed Advanced Practice Provider and/or Housestaff. I personally reviewed laboratory data, imaging studies and relevant notes. I independently examined the patient and formulated the important aspects of the plan. I have edited the note to reflect any of my changes or salient points. I have personally discussed the plan with the patient and/or family.  39 y/o male with h/o ETOH use admitted with acute onset HF with severe biventricular failure. EF 15%. RV down. Cath shown normal coronaries. Elevated filling pressures with normal output. Echo ? Noncompaction. Significant daily ETOH intake. CT  shows small LLL PE>   General:  Sitting up in bed. No resp difficulty HEENT: normal Neck: supple.JVP 10 Carotids 2+ bilat; no bruits. No lymphadenopathy or thryomegaly appreciated. Cor: PMI nondisplaced. Regular tachy + s3 Lungs: clear Abdomen: obese soft, nontender, nondistended. No hepatosplenomegaly.  No bruits or masses. Good bowel sounds. Extremities: no cyanosis, clubbing, rash, edema Neuro: alert & orientedx3, cranial nerves grossly intact. moves all 4 extremities w/o difficulty. Affect flat  Suspect CM due to ETOH but may also have LVNC. Await MRI read. Stop b-blocker. Continue dig. Add spiro. Continue IV diuresis. Titrate GDMT as tolerated. Will need AC.   Glori Bickers, MD  11:05 PM

## 2019-10-07 NOTE — Progress Notes (Signed)
Per CCMD, patient had a 14 beat run of v-tach. Patient denies chest pain, shortness of breath, or dizziness. Current vitals are as follows:   10/07/19 1416  Vitals  BP 100/80  MAP (mmHg) 88  BP Location Left Arm  BP Method Automatic  Patient Position (if appropriate) Sitting  Pulse Rate (!) 116  Pulse Rate Source Monitor  Resp 16  Oxygen Therapy  SpO2 98 %  O2 Device Room Air  MEWS Score  MEWS Temp 0  MEWS Systolic 1  MEWS Pulse 2  MEWS RR 0  MEWS LOC 0  MEWS Score 3  MEWS Score Color Yellow   Chronic yellow MEWS, not an acute change. Kathlen Mody, [place] paged.

## 2019-10-07 NOTE — Progress Notes (Signed)
ANTICOAGULATION CONSULT NOTE - Initial Consult  Pharmacy Consult for Xarelto Indication: pulmonary embolus  No Known Allergies  Patient Measurements: Height: 5\' 10"  (177.8 cm) Weight: 82.3 kg (181 lb 8 oz) IBW/kg (Calculated) : 73  Vital Signs: Temp: 98.3 F (36.8 C) (05/27 0746) Temp Source: Oral (05/27 0746) BP: 111/78 (05/27 0958) Pulse Rate: 115 (05/27 0958)  Labs: Recent Labs     0000 10/04/19 1041 10/04/19 1205 10/04/19 1711 10/05/19 0235 10/05/19 0509 10/05/19 0509 10/05/19 0940 10/06/19 0618 10/06/19 1159 10/06/19 1200 10/06/19 1200 10/06/19 1212 10/07/19 0433  HGB   < > 13.3  --   --   --  14.6   < >  --  13.4   < > 14.3   < > 13.9 13.8  HCT   < > 41.2  --   --   --  45.2   < >  --  41.6   < > 42.0  --  41.0 43.2  PLT   < > 286  --   --   --  303  --   --  272  --   --   --   --  275  APTT  --   --   --  27  --   --   --   --   --   --   --   --   --   --   LABPROT  --   --   --  14.1  --   --   --   --   --   --   --   --   --   --   INR  --   --   --  1.1  --   --   --   --   --   --   --   --   --   --   HEPARINUNFRC  --   --   --   --    < >  --   --  0.43 0.44  --   --   --   --  0.15*  CREATININE   < > 0.96  --   --   --  1.27*  --   --  1.25*  --   --   --   --  1.39*  TROPONINIHS  --  22* 23*  --   --   --   --   --   --   --   --   --   --   --    < > = values in this interval not displayed.    Estimated Creatinine Clearance: 74.4 mL/min (A) (by C-G formula based on SCr of 1.39 mg/dL (H)).   Medical History: Past Medical History:  Diagnosis Date  . Asthma     Assessment: Anticoag: +LLL PE (RV/LV ratio 1.1) Dopplers negative for DVT. CBC WNL. IV heparin>>Start Xarelto 5/27 - 5/26 Cath: normal  Goal of Therapy:  Therapeutic oral anticoagulation   Plan:  Xarelto 15mg  BID x 21d then 20mg /d   Coben Godshall S. Alford Highland, PharmD, BCPS Clinical Staff Pharmacist [email or web address] Alford Highland, [employer] 10/07/2019,10:21 AM

## 2019-10-07 NOTE — TOC CAGE-AID Note (Signed)
Transition of Care [facility]) - CAGE-AID Screening   Patient Details  Name: Robert [name] MRN: [id number] Date of Birth: May 26, 1980  [facility]) CM/SW Contact:    Emeterio Reeve, [place]in[place] Phone Number: 10/07/2019, 10:21 AM   Clinical Narrative: Pt stated that PTA he was drinking alcohol a couple times a week and smokes marijuana occasionally. Pt states he does not have a problem with either but does recognize he should cut back. Pt was receptive to resources.    CAGE-AID Screening:    Have You Ever Felt You Ought to Cut Down on Your Drinking or Drug Use?: Yes Have People Annoyed You By Critizing Your Drinking Or Drug Use?: Yes Have You Felt Bad Or Guilty About Your Drinking Or Drug Use?: No Have You Ever Had a Drink or Used Drugs First Thing In The Morning to STeady Your Nerves or to Get Rid of a Hangover?: No CAGE-AID Score: 2  Substance Abuse Education Offered: Yes  Substance abuse interventions: Patient Counseling, Educational Materials   Emeterio Reeve, Latanya Presser, [place] Social Worker [phone]

## 2019-10-07 NOTE — Progress Notes (Signed)
   10/07/19 0746  Assess: MEWS Score  Temp 98.3 F (36.8 C)  BP 99/78  Pulse Rate (!) 117  Resp 18  SpO2 100 %  O2 Device Nasal Cannula  Assess: MEWS Score  MEWS Temp 0  MEWS Systolic 1  MEWS Pulse 2  MEWS RR 0  MEWS LOC 0  MEWS Score 3  MEWS Score Color Yellow  Assess: if the MEWS score is Yellow or Red  Were vital signs taken at a resting state? Yes  Focused Assessment Documented focused assessment  Early Detection of Sepsis Score *See Row Information* Low  MEWS guidelines implemented *See Row Information* No, previously yellow, continue vital signs every 4 hours  Treat  MEWS Interventions Other (Comment) (Continue to monitor vitals closely; previously yellow.)

## 2019-10-07 NOTE — Progress Notes (Signed)
Patient called out with complaints of chest pain, being hot and tingling in his left arm. When RN arrived to the room patient was SOB and in distress. Vitals, CBG, and ECG obtained. O2 placed on patient. Left radial pulse assessed 1+ by RN and Agricultural consultant.  MD Akhter made aware with no new orders and stated to report to day shift RN that if patient is still having tingling in left arm he may need an US of the arm. RR RN also at bedside and agrees with treatment plan. Ativan administered per CIWA protocol see MAR. RN will continue to monitor.

## 2019-10-08 ENCOUNTER — Other Ambulatory Visit: Payer: Self-pay

## 2019-10-08 ENCOUNTER — Inpatient Hospital Stay: Payer: Self-pay

## 2019-10-08 DIAGNOSIS — I428 Other cardiomyopathies: Secondary | ICD-10-CM

## 2019-10-08 LAB — COOXEMETRY PANEL
Carboxyhemoglobin: 0.9 % (ref 0.5–1.5)
Methemoglobin: 0.7 % (ref 0.0–1.5)
O2 Saturation: 54.7 %
Total hemoglobin: 12.8 g/dL (ref 12.0–16.0)

## 2019-10-08 LAB — BASIC METABOLIC PANEL
Anion gap: 10 (ref 5–15)
BUN: 19 mg/dL (ref 6–20)
CO2: 27 mmol/L (ref 22–32)
Calcium: 9 mg/dL (ref 8.9–10.3)
Chloride: 99 mmol/L (ref 98–111)
Creatinine, Ser: 1.37 mg/dL — ABNORMAL HIGH (ref 0.61–1.24)
GFR calc Af Amer: >60 mL/min (ref >60)
GFR calc non Af Amer: >60 mL/min (ref >60)
Glucose, Bld: 138 mg/dL — ABNORMAL HIGH (ref 70–99)
Potassium: 4.3 mmol/L (ref 3.5–5.1)
Sodium: 136 mmol/L (ref 135–145)

## 2019-10-08 LAB — MAGNESIUM: Magnesium: 2 mg/dL (ref 1.7–2.4)

## 2019-10-08 MED ORDER — SODIUM CHLORIDE 0.9% FLUSH: 10.0000 mL | INTRAVENOUS | Status: DC | PRN

## 2019-10-08 MED ORDER — MILRINONE LACTATE IN DEXTROSE 20-5 MG/100ML-% IV SOLN
0.1250 ug/kg/min | INTRAVENOUS | Status: DC
  Administered 2019-10-08 – 2019-10-10 (×6): 0.25 ug/kg/min via INTRAVENOUS
  Administered 2019-10-11: 0.125 ug/kg/min via INTRAVENOUS
  Filled 2019-10-08 (×7): qty 100

## 2019-10-08 MED ORDER — DIGOXIN 125 MCG PO TABS
0.2500 mg | ORAL_TABLET | Freq: Every day | ORAL | Status: DC
  Administered 2019-10-08: 0.25 mg via ORAL
  Filled 2019-10-08: qty 2

## 2019-10-08 MED ORDER — SPIRONOLACTONE 12.5 MG HALF TABLET
12.5000 mg | ORAL_TABLET | Freq: Every day | ORAL | Status: DC
  Administered 2019-10-08: 12.5 mg via ORAL
  Filled 2019-10-08: qty 1

## 2019-10-08 MED ORDER — DIGOXIN 125 MCG PO TABS
0.1250 mg | ORAL_TABLET | Freq: Every day | ORAL | Status: DC
  Administered 2019-10-09 – 2019-10-13 (×5): 0.125 mg via ORAL
  Filled 2019-10-08 (×5): qty 1

## 2019-10-08 MED ORDER — SODIUM CHLORIDE 0.9% FLUSH
10.0000 mL | Freq: Two times a day (BID) | INTRAVENOUS | Status: DC
  Administered 2019-10-08 – 2019-10-13 (×6): 10 mL

## 2019-10-08 MED ORDER — CHLORHEXIDINE GLUCONATE CLOTH 2 % EX PADS
6.0000 | MEDICATED_PAD | Freq: Every day | CUTANEOUS | Status: DC
  Administered 2019-10-08 – 2019-10-13 (×5): 6 via TOPICAL

## 2019-10-08 NOTE — Progress Notes (Signed)
   10/08/19 1950  Assess: MEWS Score  Temp 98.7 F (37.1 C)  BP 109/71  Pulse Rate (!) 114  Resp 16  SpO2 97 %  O2 Device Room Air  Assess: MEWS Score  MEWS Temp 0  MEWS Systolic 0  MEWS Pulse 2  MEWS RR 0  MEWS LOC 0  MEWS Score 2  MEWS Score Color Yellow  Assess: if the MEWS score is Yellow or Red  Were vital signs taken at a resting state? Yes  Focused Assessment Documented focused assessment  Early Detection of Sepsis Score *See Row Information* Low  MEWS guidelines implemented *See Row Information* Yes  Treat  MEWS Interventions Escalated (See documentation below)  Take Vital Signs  Increase Vital Sign Frequency  Yellow: Q 2hr X 2 then Q 4hr X 2, if remains yellow, continue Q 4hrs  Escalate  MEWS: Escalate Yellow: discuss with charge nurse/RN and consider discussing with provider and RRT  Notify: Charge Nurse/RN  Name of Charge Nurse/RN Notified Danae Chen RN  Date Charge Nurse/RN Notified 10/08/19  Time Charge Nurse/RN Notified 2000  Notify: Provider  Provider Name/Title Ugowe MD  Date Provider Notified 10/08/19  Time Provider Notified 2025  Notification Type Page  Notification Reason Change in status  Response No new orders  Date of Provider Response 10/08/19  Time of Provider Response 2030  Document  Patient Outcome Stabilized after interventions

## 2019-10-08 NOTE — Progress Notes (Addendum)
Advanced Heart Failure Rounding Note  PCP-Cardiologist: Skeet Latch, MD  AHF: Dr. Haroldine Laws    Patient Profile   39 y/o male with h/o ETOH use admitted with acute onset HF with severe biventricular failure. EF 15%. RV down. Cath shown normal coronaries. Elevated filling pressures with normal output. cMRI shows Noncompaction. Significant daily ETOH intake. CT shows small LLL PE.   Subjective:    Continues to endorse dyspnea and orthopnea.   Diuresis sluggish and SCr rising w/ attempts at diuresis, up to 1.4 today (baseline 0.96). BP soft. Concern for low output.    Sinus tach on tele, w/ runs of VT, longest ~13 beats.   cMRI c/w noncompaction.    Objective:   Weight Range: 81.9 kg Body mass index is 25.9 kg/m.   Vital Signs:   Temp:  [97.8 F (36.6 C)-98.8 F (37.1 C)] 98.8 F (37.1 C) (05/28 0327) Pulse Rate:  [108-116] 110 (05/28 0327) Resp:  [15-19] 18 (05/28 0327) BP: (92-111)/(74-82) 96/81 (05/28 0327) SpO2:  [97 %-100 %] 100 % (05/28 0327) Weight:  [81.9 kg] 81.9 kg (05/28 0327) Last BM Date: 10/07/19  Weight change: Filed Weights   10/07/19 0440 10/07/19 0511 10/08/19 0327  Weight: 81.9 kg 82.3 kg 81.9 kg    Intake/Output:   Intake/Output Summary (Last 24 hours) at 10/08/2019 0947 Last data filed at 10/08/2019 0841 Gross per 24 hour  Intake 1420 ml  Output 1700 ml  Net -280 ml      Physical Exam    General:  Young male, No resp difficulty HEENT: Normal Neck: Supple. Elevated JVP to jaw . Carotids 2+ bilat; no bruits. No lymphadenopathy or thyromegaly appreciated. Cor: PMI nondisplaced. Regular rhythm, tachy rate. No rubs, gallops or murmurs. Lungs: Clear Abdomen: Soft, nontender, nondistended. No hepatosplenomegaly. No bruits or masses. Good bowel sounds. Extremities: No cyanosis, clubbing, rash, edema Neuro: Alert & orientedx3, cranial nerves grossly intact. moves all 4 extremities w/o difficulty. Affect pleasant   Telemetry    Sinus tach 123s, NSVT up to 13 beats   EKG    No new EKG to review   Labs    CBC Recent Labs    10/06/19 0618 10/06/19 1159 10/06/19 1212 10/07/19 0433  WBC 4.9  --   --  5.6  HGB 13.4   < > 13.9 13.8  HCT 41.6   < > 41.0 43.2  MCV 91.8  --   --  94.5  PLT 272  --   --  275   < > = values in this interval not displayed.   Basic Metabolic Panel Recent Labs    10/07/19 0433 10/08/19 0445  NA 138 136  K 4.0 4.3  CL 99 99  CO2 24 27  GLUCOSE 116* 138*  BUN 18 19  CREATININE 1.39* 1.37*  CALCIUM 8.7* 9.0   Liver Function Tests No results for input(s): AST, ALT, ALKPHOS, BILITOT, PROT, ALBUMIN in the last 72 hours. No results for input(s): LIPASE, AMYLASE in the last 72 hours. Cardiac Enzymes No results for input(s): CKTOTAL, CKMB, CKMBINDEX, TROPONINI in the last 72 hours.  BNP: BNP (last 3 results) Recent Labs    10/04/19 1041  BNP 2,614.9*    ProBNP (last 3 results) No results for input(s): PROBNP in the last 8760 hours.   D-Dimer No results for input(s): DDIMER in the last 72 hours. Hemoglobin A1C No results for input(s): HGBA1C in the last 72 hours. Fasting Lipid Panel No results for input(s): CHOL, HDL,  LDLCALC, TRIG, CHOLHDL, LDLDIRECT in the last 72 hours. Thyroid Function Tests No results for input(s): TSH, T4TOTAL, T3FREE, THYROIDAB in the last 72 hours.  Invalid input(s): FREET3  Other results:   Imaging    MR CARDIAC MORPHOLOGY W WO CONTRAST  Result Date: 10/08/2019 CLINICAL DATA:  39 year old male with h/o tobacco, etoh and cocaine abuse, admitted with new onset CHF and pulmonary embolism. EXAM: CARDIAC MRI TECHNIQUE: The patient was scanned on a 1.5 Tesla GE magnet. A dedicated cardiac coil was used. Functional imaging was done using Fiesta sequences. 2,3, and 4 chamber views were done to assess for RWMA's. Modified Simpson's rule using a short axis stack was used to calculate an ejection fraction on a dedicated work Brewing technologist. The patient received 8 cc of Gadavist. After 10 minutes inversion recovery sequences were used to assess for infiltration and scar tissue. CONTRAST:  8 cc  of Gadavist FINDINGS: 1. Severely dilated left ventricle with normal wall thickness and severely decreased systolic function (LVEF = 14%). There is severe global hypokinesis and no late gadolinium enhancement in the left ventricular myocardium. There are pronounced trabeculations in the left ventricular mid and apical segments with non-compacted to compacted myocardial ratio 4:1. LVEDD: 74 mm LVESD: 69 mm LVEDV: 388 ml LVESV: 335 ml SV: 53 ml Myocardial mass: 158g 2. Moderately dilated right ventricle with normal wall thickness and severely decreased systolic function (RVEF = 16%). There are no regional wall motion abnormalities. 3.  Severely dilated left atrium and moderately right atrium. 4. Normal size of the aortic root, ascending aorta. Moderately dilated pulmonary artery measuring 35 mm. 5.  Mild mitral and tricuspid regurgitation. 6. Normal pericardium. Moderate circumferential pericardial effusion wit maximum diameter 17 mm around the inferior wall. IVC diameter 22 mm. IMPRESSION: 1. Severely dilated left ventricle with normal wall thickness and severely decreased systolic function (LVEF = 14%). There is severe global hypokinesis and no late gadolinium enhancement in the left ventricular myocardium. There are pronounced trabeculations in the left ventricular mid and apical segments with non-compacted to compacted myocardial ratio 4:1. 2. Moderately dilated right ventricle with normal wall thickness and severely decreased systolic function (RVEF = 16%). There are no regional wall motion abnormalities. 3.  Severely dilated left atrium and moderately right atrium. 4. Normal size of the aortic root, ascending aorta. Moderately dilated pulmonary artery measuring 35 mm. 5.  Mild mitral and tricuspid regurgitation. 6. Normal pericardium.  Moderate circumferential pericardial effusion with maximum diameter 19 mm around the inferior wall. IVC diameter 22 mm with no collapse with respiratory variations. There is mild right atrial invagination. No definite signs of tamponade. 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 Electronically Signed   By: Ena Dawley   On: 10/07/2019 17:11      Medications:     Scheduled Medications: . folic acid  1 mg Oral Daily  . furosemide  40 mg Intravenous BID  . metoprolol succinate  12.5 mg Oral Daily  . multivitamin with minerals  1 tablet Oral Daily  . rivaroxaban  15 mg Oral BID   Followed by  . [START ON 10/28/2019] rivaroxaban  20 mg Oral Daily  . sodium chloride flush  3 mL Intravenous Once  . sodium chloride flush  3 mL Intravenous Q12H  . sodium chloride flush  3 mL Intravenous Q12H  . thiamine  100 mg Oral Daily     Infusions: . sodium chloride  250 mL (10/07/19 1707)  . milrinone       PRN Medications:  sodium chloride, acetaminophen, diazepam, iohexol, LORazepam **OR** LORazepam, ondansetron (ZOFRAN) IV, ondansetron **OR** [DISCONTINUED] ondansetron (ZOFRAN) IV, sodium chloride flush, sodium chloride flush     Assessment/Plan   1. Acute Systolic Heart Failure, NICM  - ECHO EF 15% RV moderately reduced. Suspect ETOH playing a role.  - LHC with no coronary disease. RHC with preserved cardiac output and elevated filling pressures - cMRI shows noncompaction. Will need gene testing done as outpatient  - Remains volume overloaded. Diuresis sluggish w/ worsening renal function concerning for low output - Agree w/ emperic milrinone to try to argument diuresis, starting at 0.25 mg/kg/min. Place PICC line to follow co-ox and CVPs - Will d/c metoprolol for now. May need later addition of corlanor if tachycardia persists  - Continue digoxin 0.125 mg daily   - Add 12.5 mg of spiro qhs - Continue IV Lasix 40 mg bid - Frequent NSVT on tele. Monitor w/ milrinone. Add IV amiodarone if needed. May need LifeVest at d/c. Keep K >4.0 and Mg >2.0  - consider SGLT2i. ? DAPA trial    2. Pulmonary Emboli - CTA w/ small LLL PE - LE Venous dopplers negative for DVT  - Now on xarelto   3. Sinus Tachycardia - suspect 2/2 PE + Volume overload and low output + ? ETOH withdrawal  - management per above - monitor rates on tele. Low threshold to start IV amiodarone  - hold  blocker for now - consider corlanor if tachycardia persists   4. ETOH Abuse - Heavy drinker for many years. - On CIWA  - continue to follow Mg and supp as needed  - Refer to [facility]FS[facility]  For outpatient follow up.   5. H/O Asthma  - Uses rescue inhaler but hasnt his inhaler recently.     6. H/O Cocaine Abuse - Last used 2-3 weeks ago  7. Hypomagnesia - Mag 1.5 yesterday, treated w/ IV MgSO4  - repeat Mg pending   8. Pericardial Effusion - moderate circumferential effusion on cMRI. No definite signs of tamponade on study but he is tachycardic w/ soft BP. Monitor closely. Will need repeat echo to assess for interval change.     Length of Stay: [address], [place]  10/08/2019, 9:47 AM  Advanced Heart Failure Team Pager [phone] (M-F; 7a - 4p)  Please contact [facility] for night-coverage after hours (4p -7a ) and weekends on [email or web address]  Patient seen and examined with the above-signed Advanced Practice Provider and/or Housestaff. I personally reviewed laboratory data, imaging studies and relevant notes. I independently examined the patient and formulated the important aspects of the plan. I have edited the note to reflect any of my changes or salient points. I have personally discussed the plan with the patient and/or family.  Now on milrinone 0.25. Coox drawn after milrinone initiation still low at 54%. CVP 12. Diuresing well with IV lasix and milrinone support.  Occasional NSVT.cMRI shows EF 16% + LVNC  General:  Sitting uo in bed. No resp difficulty HEENT: normal Neck: supple.jvp to jaw. Carotids 2+ bilat; no bruits. No lymphadenopathy or thryomegaly appreciated. Cor: PMI nondisplaced. Tachy reg + s3 Lungs: clear Abdomen: soft, nontender, nondistended. No hepatosplenomegaly. No bruits or masses. Good bowel sounds. Extremities: no cyanosis, clubbing, rash,1+  edema Neuro: alert & orientedx3, cranial nerves grossly intact. moves all 4 extremities w/o difficulty. Affect pleasant  Milrinone started. Co-ox remains marginal. Continue IV  lasix. Follow co-ox  TItrate GDMT as tolerated. Will need genetic testing.   Glori Bickers, MD  11:50 PM

## 2019-10-08 NOTE — Progress Notes (Signed)
Peripherally Inserted Central Catheter Placement  The IV Nurse has discussed with the patient and/or persons authorized to consent for the patient, the purpose of this procedure and the potential benefits and risks involved with this procedure.  The benefits include less needle sticks, lab draws from the catheter, and the patient may be discharged home with the catheter. Risks include, but not limited to, infection, bleeding, blood clot (thrombus formation), and puncture of an artery; nerve damage and irregular heartbeat and possibility to perform a PICC exchange if needed/ordered by physician.  Alternatives to this procedure were also discussed.  Bard Power PICC patient education guide, fact sheet on infection prevention and patient information card has been provided to patient /or left at bedside.    PICC Placement Documentation  PICC Double Lumen 20/80/22 PICC Right Basilic 38 cm 0 cm (Active)  Indication for Insertion or Continuance of Line Vasoactive infusions 10/08/19 1442  Exposed Catheter (cm) 0 cm 10/08/19 1442  Site Assessment Clean;Dry;Intact 10/08/19 1442  Lumen #1 Status Flushed;Saline locked;Blood return noted 10/08/19 1442  Lumen #2 Status Flushed;Saline locked;Blood return noted 10/08/19 1442  Dressing Type Transparent 10/08/19 1442  Dressing Status Clean;Dry;Intact;Antimicrobial disc in place 10/08/19 1442  Dressing Intervention New dressing 10/08/19 1442  Dressing Change Due 10/15/19 10/08/19 1442       Gus, Littler 10/08/2019, 2:43 PM

## 2019-10-08 NOTE — Progress Notes (Signed)
   10/07/19 1900  Assess: MEWS Score  ECG Heart Rate (!) 119  Assess: MEWS Score  MEWS Temp 0  MEWS Systolic 0  MEWS Pulse 2  MEWS RR 0  MEWS LOC 0  MEWS Score 2  MEWS Score Color Yellow  Assess: if the MEWS score is Yellow or Red  Were vital signs taken at a resting state? Yes  Focused Assessment Documented focused assessment  Early Detection of Sepsis Score *See Row Information* Low  MEWS guidelines implemented *See Row Information* No, previously yellow, continue vital signs every 4 hours  Treat  MEWS Interventions Administered scheduled meds/treatments  Escalate  MEWS: Escalate Yellow: discuss with charge nurse/RN and consider discussing with provider and RRT  Notify: Charge Nurse/RN  Name of Charge Nurse/RN Notified Danae Chen  Date Charge Nurse/RN Notified 10/07/19  Time Charge Nurse/RN Notified 2035  Document  Patient Outcome Other (Comment)  Progress note created (see row info) Yes

## 2019-10-08 NOTE — Progress Notes (Signed)
Progress Note  Patient Name: Robert [name] Date of Encounter: 10/08/2019  Primary Cardiologist: Skeet Latch, MD   Subjective   Feels poorly.  Breathing worse than yesterday.  Unable to lay flat.   Inpatient Medications    Scheduled Meds: . digoxin  0.25 mg Oral Daily  . folic acid  1 mg Oral Daily  . furosemide  40 mg Intravenous BID  . metoprolol succinate  12.5 mg Oral Daily  . multivitamin with minerals  1 tablet Oral Daily  . rivaroxaban  15 mg Oral BID   Followed by  . [START ON 10/28/2019] rivaroxaban  20 mg Oral Daily  . sodium chloride flush  3 mL Intravenous Once  . sodium chloride flush  3 mL Intravenous Q12H  . sodium chloride flush  3 mL Intravenous Q12H  . thiamine  100 mg Oral Daily   Or  . thiamine  100 mg Intravenous Daily   Continuous Infusions: . sodium chloride 250 mL (10/07/19 1707)   PRN Meds: sodium chloride, acetaminophen, diazepam, iohexol, LORazepam **OR** LORazepam, ondansetron (ZOFRAN) IV, ondansetron **OR** [DISCONTINUED] ondansetron (ZOFRAN) IV, sodium chloride flush, sodium chloride flush   Vital Signs    Vitals:   10/07/19 1637 10/07/19 2033 10/08/19 0040 10/08/19 0327  BP: 101/82 96/74 92/78  96/81  Pulse: (!) 116 (!) 110 (!) 108 (!) 110  Resp: 16 17 19 18   Temp: 98.5 F (36.9 C) 98.1 F (36.7 C) 97.8 F (36.6 C) 98.8 F (37.1 C)  TempSrc: Oral Oral Oral Oral  SpO2: 100% 100% 98% 100%  Weight:    81.9 kg  Height:        Intake/Output Summary (Last 24 hours) at 10/08/2019 0756 Last data filed at 10/08/2019 0328 Gross per 24 hour  Intake 1420 ml  Output 1600 ml  Net -180 ml   Last 3 Weights 10/08/2019 10/07/2019 10/07/2019  Weight (lbs) 180 lb 8 oz 181 lb 8 oz 180 lb 8 oz  Weight (kg) 81.874 kg 82.328 kg 81.874 kg      Telemetry    Sinus tachycardia.  Heart rate 100s to 110s.  14 beats NSVT - Personally Reviewed  ECG    n/a - Personally Reviewed  Physical Exam   VS:  BP 96/81 (BP Location: Left Arm)   Pulse (!)  110   Temp 98.8 F (37.1 C) (Oral)   Resp 18   Ht 5\' 10"  (1.778 m)   Wt 81.9 kg   SpO2 100%   BMI 25.90 kg/m  , BMI Body mass index is 25.9 kg/m. GENERAL:  Well appearing HEENT: Pupils equal round and reactive, fundi not visualized, oral mucosa unremarkable NECK:  + jugular venous distention, waveform within normal limits, carotid upstroke brisk and symmetric, no bruits LUNGS:  CTAB HEART:  RRR.  PMI not displaced or sustained,S1 and S2 within normal limits, no S3, no S4, no clicks, no rubs, no murmurs ABD:  Flat, positive bowel sounds normal in frequency in pitch, no bruits, no rebound, no guarding, no midline pulsatile mass, no hepatomegaly, no splenomegaly EXT:  2 plus pulses throughout, trace LE edema, no cyanosis no clubbing SKIN:  No rashes no nodules NEURO:  Cranial nerves II through XII grossly intact, motor grossly intact throughout PSYCH:  Cognitively intact, oriented to person place and time   Labs    High Sensitivity Troponin:   Recent Labs  Lab 10/04/19 1041 10/04/19 1205  TROPONINIHS 22* 23*      Chemistry Recent Labs  Lab 10/06/19  4825 10/06/19 1159 10/06/19 1212 10/07/19 0433 10/08/19 0445  NA 137   < > 136 138 136  K 3.3*   < > 3.7 4.0 4.3  CL 95*  --   --  99 99  CO2 28  --   --  24 27  GLUCOSE 129*  --   --  116* 138*  BUN 16  --   --  18 19  CREATININE 1.25*  --   --  1.39* 1.37*  CALCIUM 8.7*  --   --  8.7* 9.0  GFRNONAA >60  --   --  >60 >60  GFRAA >60  --   --  >60 >60  ANIONGAP 14  --   --  15 10   < > = values in this interval not displayed.     Hematology Recent Labs  Lab 10/05/19 0509 10/05/19 0509 10/06/19 0618 10/06/19 1159 10/06/19 1200 10/06/19 1212 10/07/19 0433  WBC 5.7  --  4.9  --   --   --  5.6  RBC 4.91  --  4.53  --   --   --  4.57  HGB 14.6   < > 13.4   < > 14.3 13.9 13.8  HCT 45.2   < > 41.6   < > 42.0 41.0 43.2  MCV 92.1  --  91.8  --   --   --  94.5  MCH 29.7  --  29.6  --   --   --  30.2  MCHC 32.3  --   32.2  --   --   --  31.9  RDW 12.9  --  12.9  --   --   --  12.9  PLT 303  --  272  --   --   --  275   < > = values in this interval not displayed.    BNP Recent Labs  Lab 10/04/19 1041  BNP 2,614.9*     DDimer No results for input(s): DDIMER in the last 168 hours.   Radiology    CARDIAC CATHETERIZATION  Result Date: 10/06/2019 Moderate elevation of right heart pressures. Moderate pulmonary venous hypertension with a mean PA pressure at 37 mmHg; PVR 2.7 WU Normal coronary arteries with a dominant RCA. RECOMMENDATION: Guideline directed medical therapy for severe LV dysfunction.  To initiate heparin therapy following the procedure later today with plans to transition to oral anticoagulation with his pulmonary embolism tomorrow.   Cardiac Studies   Cardiac MRI 10/07/19: 1. Severely dilated left ventricle with normal wall thickness and severely decreased systolic function (LVEF = 14%). There is severe global hypokinesis and no late gadolinium enhancement in the left ventricular myocardium. There are pronounced trabeculations in the left ventricular mid and apical segments with non-compacted to compacted myocardial ratio 4:1.  2. Moderately dilated right ventricle with normal wall thickness and severely decreased systolic function (RVEF = 16%). There are no regional wall motion abnormalities.  3.  Severely dilated left atrium and moderately right atrium.  4. Normal size of the aortic root, ascending aorta. Moderately dilated pulmonary artery measuring 35 mm.  5.  Mild mitral and tricuspid regurgitation.  6. Normal pericardium. Moderate circumferential pericardial effusion with maximum diameter 19 mm around the inferior wall. IVC diameter 22 mm with no collapse with respiratory variations. There is mild right atrial invagination. No definite signs of tamponade.  These findings are consistent with non-ischemic cardiomyopathy with severe biventricular systolic  dysfunction and highly suggestive of non-compaction  cardiomyopathy.  Echo 10/05/19 1. Left ventricular ejection fraction, by estimation, is <20%. The left  ventricle has severely decreased function. The left ventricle demonstrates  global hypokinesis. The left ventricular internal cavity size was  moderately to severely dilated.  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 Left ventricular diastolic parameters are  consistent with Grade III diastolic dysfunction (restrictive). Elevated  left ventricular end-diastolic pressure.  2. Right ventricular systolic function is moderately reduced. The right  ventricular size is mildly enlarged. There is mildly elevated pulmonary  artery systolic pressure. The estimated right ventricular systolic  pressure is 08.6 mmHg.  3. Left atrial size was moderately dilated.  4. Right atrial size was mildly dilated.  5. The mitral valve is normal in structure. Mild mitral valve  regurgitation. No evidence of mitral stenosis.  6. The aortic valve is tricuspid. Aortic valve regurgitation is not  visualized. No aortic stenosis is present.  7. The inferior vena cava is dilated in size with <50% respiratory  variability, suggesting right atrial pressure of 15 mmHg.   LE Doppler: NO DVT  RHC/LHC 10/06/19: Right Heart Pressures RA: A-wave 14, V wave 11; mean 10 RV: 45/15 PA: 47/27; mean 37 PW: A-wave 27, V wave 25; mean 24  Initial AO: 86/69          LV: 84/14  LV: 86/14 AO: 86/65  Oxygen saturation in the PA 70% and in the AO 100%  Fick cardiac output 4.9 L/min with cardiac index 2.4 L/min/m.  PVR: 2.7 WU  AO: 86/65   Normal coronary arteries  Patient Profile     Mr. Garriga is a 23M with tobacco abuse, cocaine abuse, and asthma admitted with new onset heart failure  Assessment & Plan    # Acute systolic and diastolic heart failure: # LV/RV Noncompaction:  # Pericardial  effusion: # Chest pain:  # Demand ischemia: #Cardiomegaly: #Palpitations: Mr. Renstrom has new onset heart failure.  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 Cardia MRI is  Consistent with LV noncompaction.  There was also a moderate pericardial effusion without tamponade.  He needs a repeat echo in 2 days to re-evaluate.  EtOH abuse is likely contributory.  Cath was negative for CAD.  Appreciate Advanced HF service.  He was started on spironolactone and BB stopped.  He is not diuresing and BP is low.  We will get a PICC, CVP and co-ox.  Start milrinone.  HF service will continue to follow.   # PE:  Small.  No DVT on lower extremity Dopplers.  Continue Xarelto.  # Polysubstance abuse: Importance of alcohol and substance abuse cessation was stressed.  We will place social work consult.  Watch for signs of withdrawal, though he seems okay right now.   For questions or updates, please contact [facility] [facility] Please consult [email or web address] for contact info under    Time spent: 35 minutes-Greater than 50% of this time was spent in counseling, explanation of diagnosis, planning of further management, and coordination of care.     Signed, Skeet Latch, MD  10/08/2019, 7:56 AM

## 2019-10-08 NOTE — TOC Benefit Eligibility Note (Signed)
Transition of Care [facility]) Benefit Eligibility Note    Patient Details  Name: Robert [name] MRN: [id number] Date of Birth: 06-27-1980   Medication/Dose: Alveda Reasons 15  MG BID  Covered?: Yes  Tier: 2 Drug  Prescription Coverage Preferred [facility]: [facility]  Spoke with Person/Company/Phone Number:: CHRIS   @  [employer] # [phone]  Co-Pay: $251.26   25% OF TOTAL COST  Prior Approval: No  Deductible: (NO DEDUCTIBLE WITH PLAN /  OUT-OF-POCKET :UNMET)  Additional Notes: XARELTO 20 MG DAILY: COVER- YES  CO-PAY- $125.65  25 % OF TOTAL COST TIER- 2 DRUG  P/A -NO    Memory Argue Phone Number: 10/08/2019, 3:53 PM

## 2019-10-09 LAB — COOXEMETRY PANEL
Carboxyhemoglobin: 1.2 % (ref 0.5–1.5)
Methemoglobin: 1 % (ref 0.0–1.5)
O2 Saturation: 55.3 %
Total hemoglobin: 13.7 g/dL (ref 12.0–16.0)

## 2019-10-09 LAB — BASIC METABOLIC PANEL
Anion gap: 11 (ref 5–15)
BUN: 23 mg/dL — ABNORMAL HIGH (ref 6–20)
CO2: 26 mmol/L (ref 22–32)
Calcium: 8.9 mg/dL (ref 8.9–10.3)
Chloride: 95 mmol/L — ABNORMAL LOW (ref 98–111)
Creatinine, Ser: 1.39 mg/dL — ABNORMAL HIGH (ref 0.61–1.24)
GFR calc Af Amer: >60 mL/min (ref >60)
GFR calc non Af Amer: >60 mL/min (ref >60)
Glucose, Bld: 150 mg/dL — ABNORMAL HIGH (ref 70–99)
Potassium: 4.3 mmol/L (ref 3.5–5.1)
Sodium: 132 mmol/L — ABNORMAL LOW (ref 135–145)

## 2019-10-09 LAB — MAGNESIUM: Magnesium: 1.9 mg/dL (ref 1.7–2.4)

## 2019-10-09 MED ORDER — SPIRONOLACTONE 12.5 MG HALF TABLET
12.5000 mg | ORAL_TABLET | Freq: Every day | ORAL | Status: DC
  Administered 2019-10-09 – 2019-10-11 (×3): 12.5 mg via ORAL
  Filled 2019-10-09 (×4): qty 1

## 2019-10-09 MED ORDER — MAGNESIUM SULFATE 2 GM/50ML IV SOLN
2.0000 g | Freq: Once | INTRAVENOUS | Status: AC
  Administered 2019-10-09: 2 g via INTRAVENOUS
  Filled 2019-10-09: qty 50

## 2019-10-09 MED ORDER — LOSARTAN POTASSIUM 25 MG PO TABS
12.5000 mg | ORAL_TABLET | Freq: Two times a day (BID) | ORAL | Status: DC
  Administered 2019-10-09 – 2019-10-13 (×6): 12.5 mg via ORAL
  Filled 2019-10-09 (×7): qty 1

## 2019-10-09 NOTE — Progress Notes (Addendum)
Advanced Heart Failure Rounding Note  PCP-Cardiologist: Skeet Latch, MD  AHF: Dr. Haroldine Laws    Patient Profile   39 y/o male with h/o ETOH use admitted with acute onset HF with severe biventricular failure. EF 15%. RV down. Cath shown normal coronaries. Elevated filling pressures with normal output. cMRI shows Noncompaction. Significant daily ETOH intake. CT shows small LLL PE.   Subjective:    Started on milrinone 0.25 on 5/28.   Co-ox 55% Diuresis has picked up. Weight down 5 pounds. CVP 12 (checked personally)  Denies orthopnea or PND. Renal function stable.   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    Objective:   Weight Range: 79.6 kg Body mass index is 25.17 kg/m.   Vital Signs:   Temp:  [98.1 F (36.7 C)-99.1 F (37.3 C)] 98.7 F (37.1 C) (05/29 1055) Pulse Rate:  [108-119] 112 (05/29 1055) Resp:  [16-20] 17 (05/29 1156) BP: (101-110)/(69-90) 107/70 (05/29 1055) SpO2:  [95 %-100 %] 99 % (05/29 1055) Weight:  [79.6 kg] 79.6 kg (05/29 0548) Last BM Date: 10/07/19  Weight change: Filed Weights   10/07/19 0511 10/08/19 0327 10/09/19 0548  Weight: 82.3 kg 81.9 kg 79.6 kg    Intake/Output:   Intake/Output Summary (Last 24 hours) at 10/09/2019 1448 Last data filed at 10/09/2019 1100 Gross per 24 hour  Intake 3638.87 ml  Output 3875 ml  Net -236.13 ml      Physical Exam    General: Sitting up in bed. No resp difficulty HEENT: normal Neck: supple. JVP 12  Carotids 2+ bilat; no bruits. No lymphadenopathy or thryomegaly appreciated. Cor: PMI nondisplaced. Regular tachy +s3  Lungs: clear Abdomen: soft, nontender, nondistended. No hepatosplenomegaly. No bruits or masses. Good bowel sounds. Extremities: no cyanosis, clubbing, rash, edema Neuro: alert & orientedx3, cranial nerves grossly intact. moves all 4 extremities w/o difficulty. Affect pleasant    Telemetry   Sinus tach 110-120 occasional NSVT Personally reviewed   Labs     CBC Recent Labs    10/07/19 0433  WBC 5.6  HGB 13.8  HCT 43.2  MCV 94.5  PLT 017   Basic Metabolic Panel Recent Labs    10/08/19 0445 10/09/19 0510  NA 136 132*  K 4.3 4.3  CL 99 95*  CO2 27 26  GLUCOSE 138* 150*  BUN 19 23*  CREATININE 1.37* 1.39*  CALCIUM 9.0 8.9  MG 2.0 1.9   Liver Function Tests No results for input(s): AST, ALT, ALKPHOS, BILITOT, PROT, ALBUMIN in the last 72 hours. No results for input(s): LIPASE, AMYLASE in the last 72 hours. Cardiac Enzymes No results for input(s): CKTOTAL, CKMB, CKMBINDEX, TROPONINI in the last 72 hours.  BNP: BNP (last 3 results) Recent Labs    10/04/19 1041  BNP 2,614.9*    ProBNP (last 3 results) No results for input(s): PROBNP in the last 8760 hours.   D-Dimer No results for input(s): DDIMER in the last 72 hours. Hemoglobin A1C No results for input(s): HGBA1C in the last 72 hours. Fasting Lipid Panel No results for input(s): CHOL, HDL, LDLCALC, TRIG, CHOLHDL, LDLDIRECT in the last 72 hours. Thyroid Function Tests No results for input(s): TSH, T4TOTAL, T3FREE, THYROIDAB in the last 72 hours.  Invalid input(s): FREET3  Other results:   Imaging    No results found.   Medications:     Scheduled Medications: . Chlorhexidine Gluconate Cloth  6 each Topical Daily  . digoxin  0.125 mg Oral Daily  . folic acid  1 mg Oral Daily  . furosemide  40 mg Intravenous BID  . multivitamin with minerals  1 tablet Oral Daily  . rivaroxaban  15 mg Oral BID   Followed by  . [START ON 10/28/2019] rivaroxaban  20 mg Oral Daily  . sodium chloride flush  10-40 mL Intracatheter Q12H  . thiamine  100 mg Oral Daily    Infusions: . milrinone 0.25 mcg/kg/min (10/09/19 1132)    PRN Medications: acetaminophen, diazepam, iohexol, ondansetron (ZOFRAN) IV, ondansetron **OR** [DISCONTINUED] ondansetron (ZOFRAN) IV, sodium chloride flush     Assessment/Plan   1. Acute Systolic Heart Failure, NICM  - ECHO EF 15% RV  moderately reduced. Suspect ETOH playing a role.  - LHC with no coronary disease. 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 (will need outpatient genetic testing) - Milrinone started on 5/28. Now on milrinone 0.25. Co-ox 55% - CVP 12 - Continue digoxin 0.125 mg daily  - Add spiro 12.5 daily - Add losartan 12.5 bid - Continue IV Lasix 40 mg bid - Frequent NSVT on tele. Monitor w/ milrinone. Add IV amiodarone if needed. May need LifeVest at d/c. Keep K >4.0 and Mg >2.0  - consider SGLT2i.   2. Pulmonary Emboli - CTA w/ small LLL PE - LE Venous dopplers negative for DVT  - Now on xarelto   3. ETOH Abuse - Heavy drinker for many years. - On CIWA  - continue to follow Mg and supp as needed  - Refer to HFSW  For outpatient follow up.   4. H/O Asthma  - Uses rescue inhaler but hasnt his inhaler recently.     5. H/O Cocaine Abuse - Last used 2-3 weeks ago  6. Hypomagnesia - Mag 1.9. will supp  7. Pericardial Effusion - moderate circumferential effusion on cMRI. No definite signs of tamponade on study but he is tachycardic w/ soft BP. Monitor closely. Will need repeat echo to assess for interval change.     Length of Stay: [place], MD  10/09/2019, 2:48 PM  Advanced Heart Failure Team Pager [phone] (M-F; 7a - 4p)  Please contact [facility] for night-coverage after hours (4p -7a ) and weekends on [email or web address]

## 2019-10-09 NOTE — Progress Notes (Signed)
CARDIAC REHAB PHASE I   PRE:  Rate/Rhythm: 106 ST  BP:  Supine: 106/78 Sitting:   Standing:    SaO2: 98% RA  MODE:  Ambulation: 470 ft   POST:  Rate/Rhythm: 122 ST  BP:  Supine:   Sitting: 101/82  Standing:    SaO2: 100% RA  1315-1340 Patient tolerated ambulation well with assist x1. Patient states he felt a little "off balance" during walk, informed patient's nurse. Vital signs stable, to bed after walk IV intact, call bell within reach. Encouraged patient to walk again with nursing staff and to try to walk 3x's/day as tolerated.  Sol Passer, MS, ACSM CEP

## 2019-10-10 LAB — BASIC METABOLIC PANEL
Anion gap: 8 (ref 5–15)
BUN: 28 mg/dL — ABNORMAL HIGH (ref 6–20)
CO2: 27 mmol/L (ref 22–32)
Calcium: 8.7 mg/dL — ABNORMAL LOW (ref 8.9–10.3)
Chloride: 95 mmol/L — ABNORMAL LOW (ref 98–111)
Creatinine, Ser: 1.48 mg/dL — ABNORMAL HIGH (ref 0.61–1.24)
GFR calc Af Amer: >60 mL/min (ref >60)
GFR calc non Af Amer: 59 mL/min — ABNORMAL LOW (ref >60)
Glucose, Bld: 127 mg/dL — ABNORMAL HIGH (ref 70–99)
Potassium: 4.4 mmol/L (ref 3.5–5.1)
Sodium: 130 mmol/L — ABNORMAL LOW (ref 135–145)

## 2019-10-10 LAB — MAGNESIUM: Magnesium: 2.2 mg/dL (ref 1.7–2.4)

## 2019-10-10 LAB — COOXEMETRY PANEL
Carboxyhemoglobin: 1.3 % (ref 0.5–1.5)
Methemoglobin: 1 % (ref 0.0–1.5)
O2 Saturation: 64.9 %
Total hemoglobin: 15.6 g/dL (ref 12.0–16.0)

## 2019-10-10 MED ORDER — IVABRADINE HCL 5 MG PO TABS
5.0000 mg | ORAL_TABLET | Freq: Two times a day (BID) | ORAL | Status: DC
  Administered 2019-10-10 – 2019-10-13 (×6): 5 mg via ORAL
  Filled 2019-10-10 (×8): qty 1

## 2019-10-10 NOTE — Progress Notes (Signed)
Patient ID: Robert [name], male   DOB: [birth year], 39 y.o.   MRN: [id number]     Advanced Heart Failure Rounding Note  PCP-Cardiologist: Skeet Latch, MD  AHF: Dr. Haroldine Laws    Patient Profile   39 y/o male with h/o ETOH use admitted with acute onset HF with severe biventricular failure. EF 15%. RV down. Cath shown normal coronaries. Elevated filling pressures with normal output. cMRI shows Noncompaction. Significant daily ETOH intake. CT shows small LLL PE.   Subjective:    Started on milrinone 0.25 on 5/28.   No co-ox done this morning.  He diuresed well, CVP down to 5.  Weight down.   Denies orthopnea or PND. Creatinine mildly higher at 1.48.   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    Objective:   Weight Range: 78.7 kg Body mass index is 24.89 kg/m.   Vital Signs:   Temp:  [97.5 F (36.4 C)-98.7 F (37.1 C)] 97.9 F (36.6 C) (05/30 0745) Pulse Rate:  [106-114] 106 (05/30 0829) Resp:  [15-19] 16 (05/30 0745) BP: (88-107)/(53-70) 93/62 (05/30 0524) SpO2:  [99 %] 99 % (05/29 1055) Weight:  [78.7 kg] 78.7 kg (05/30 0156) Last BM Date: 10/07/19  Weight change: Filed Weights   10/08/19 0327 10/09/19 0548 10/10/19 0156  Weight: 81.9 kg 79.6 kg 78.7 kg    Intake/Output:   Intake/Output Summary (Last 24 hours) at 10/10/2019 1046 Last data filed at 10/10/2019 0840 Gross per 24 hour  Intake 1082.7 ml  Output 3450 ml  Net -2367.3 ml      Physical Exam    General: NAD Neck: No JVD, no thyromegaly or thyroid nodule.  Lungs: Clear to auscultation bilaterally with normal respiratory effort. CV: Lateral PMI.  Heart mildly tachy, regular S1/S2, no S3/S4, no murmur.  No peripheral edema.   Abdomen: Soft, nontender, no hepatosplenomegaly, no distention.  Skin: Intact without lesions or rashes.  Neurologic: Alert and oriented x 3.  Psych: Normal affect. Extremities: No clubbing or cyanosis.  HEENT: Normal.    Telemetry   Sinus tachy 100s. Personally  reviewed   Labs    CBC No results for input(s): WBC, NEUTROABS, HGB, HCT, MCV, PLT in the last 72 hours. Basic Metabolic Panel Recent Labs    10/09/19 0510 10/10/19 0500  NA 132* 130*  K 4.3 4.4  CL 95* 95*  CO2 26 27  GLUCOSE 150* 127*  BUN 23* 28*  CREATININE 1.39* 1.48*  CALCIUM 8.9 8.7*  MG 1.9 2.2   Liver Function Tests No results for input(s): AST, ALT, ALKPHOS, BILITOT, PROT, ALBUMIN in the last 72 hours. No results for input(s): LIPASE, AMYLASE in the last 72 hours. Cardiac Enzymes No results for input(s): CKTOTAL, CKMB, CKMBINDEX, TROPONINI in the last 72 hours.  BNP: BNP (last 3 results) Recent Labs    10/04/19 1041  BNP 2,614.9*    ProBNP (last 3 results) No results for input(s): PROBNP in the last 8760 hours.   D-Dimer No results for input(s): DDIMER in the last 72 hours. Hemoglobin A1C No results for input(s): HGBA1C in the last 72 hours. Fasting Lipid Panel No results for input(s): CHOL, HDL, LDLCALC, TRIG, CHOLHDL, LDLDIRECT in the last 72 hours. Thyroid Function Tests No results for input(s): TSH, T4TOTAL, T3FREE, THYROIDAB in the last 72 hours.  Invalid input(s): FREET3  Other results:   Imaging    No results found.   Medications:     Scheduled Medications: . Chlorhexidine Gluconate Cloth  6 each Topical  Daily  . digoxin  0.125 mg Oral Daily  . folic acid  1 mg Oral Daily  . ivabradine  5 mg Oral BID WC  . losartan  12.5 mg Oral BID  . multivitamin with minerals  1 tablet Oral Daily  . rivaroxaban  15 mg Oral BID   Followed by  . [START ON 10/28/2019] rivaroxaban  20 mg Oral Daily  . sodium chloride flush  10-40 mL Intracatheter Q12H  . spironolactone  12.5 mg Oral Daily  . thiamine  100 mg Oral Daily    Infusions: . milrinone 0.25 mcg/kg/min (10/10/19 0600)    PRN Medications: acetaminophen, diazepam, iohexol, ondansetron (ZOFRAN) IV, ondansetron **OR** [DISCONTINUED] ondansetron (ZOFRAN) IV, sodium chloride  flush     Assessment/Plan   1. Acute Systolic Heart Failure, NICM  - ECHO EF 15% RV moderately reduced. Suspect ETOH playing a role.  - LHC with no coronary disease. 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 (will need outpatient genetic testing) - Milrinone started on 5/28. Now on milrinone 0.25. Pending co-ox today.  - CVP 5, stop IV Lasix.  - Continue digoxin 0.125 mg daily  - Continue spiro 12.5 daily - Continue losartan 12.5 bid, no BP room to increase or transition to [employer].  - Corlanor 5 mg bid.  - Has had NSVT, now less frequent. Add IV amiodarone if needed. May need LifeVest at d/c. Keep K >4.0 and Mg >2.0  - consider SGLT2i.  2. Pulmonary Emboli - CTA w/ small LLL PE - LE Venous dopplers negative for DVT  - Now on xarelto   3. ETOH Abuse - Heavy drinker for many years. - On CIWA  - continue to follow Mg and supp as needed  - Refer to HFSW for outpatient follow up.   4. H/O Asthma  - Uses rescue inhaler but hasnt his inhaler recently.     5. H/O Cocaine Abuse - Last used 2-3 weeks ago  6. Hypomagnesia - Mag 1.9. will supp  7. Pericardial Effusion - moderate circumferential effusion on cMRI. No definite signs of tamponade on study but he is tachycardic w/ soft BP. Monitor closely. Will need repeat echo to assess for interval change, will order tomorrow.   Needs co-ox today, will try to slowly wean milrinone starting either later today or tomorrow depending on co-ox.   Length of Stay: 6  Loralie Champagne, MD  10/10/2019, 10:46 AM  Advanced Heart Failure Team Pager [phone] (M-F; 7a - 4p)  Please contact [facility] for night-coverage after hours (4p -7a ) and weekends on [email or web address]

## 2019-10-10 NOTE — Plan of Care (Signed)
  Problem: Education: Goal: Knowledge of General Education information will improve Description: Including pain rating scale, medication(s)/side effects and non-pharmacologic comfort measures Outcome: Progressing   Problem: Health Behavior/Discharge Planning: Goal: Ability to manage health-related needs will improve Outcome: Progressing   Problem: Clinical Measurements: Goal: Respiratory complications will improve Outcome: Progressing   Problem: Clinical Measurements: Goal: Cardiovascular complication will be avoided Outcome: Progressing

## 2019-10-11 LAB — BASIC METABOLIC PANEL
Anion gap: 10 (ref 5–15)
BUN: 25 mg/dL — ABNORMAL HIGH (ref 6–20)
CO2: 26 mmol/L (ref 22–32)
Calcium: 9 mg/dL (ref 8.9–10.3)
Chloride: 98 mmol/L (ref 98–111)
Creatinine, Ser: 1.31 mg/dL — ABNORMAL HIGH (ref 0.61–1.24)
GFR calc Af Amer: >60 mL/min (ref >60)
GFR calc non Af Amer: >60 mL/min (ref >60)
Glucose, Bld: 107 mg/dL — ABNORMAL HIGH (ref 70–99)
Potassium: 5.2 mmol/L — ABNORMAL HIGH (ref 3.5–5.1)
Sodium: 134 mmol/L — ABNORMAL LOW (ref 135–145)

## 2019-10-11 LAB — COOXEMETRY PANEL
Carboxyhemoglobin: 1 % (ref 0.5–1.5)
Carboxyhemoglobin: 0.8 % (ref 0.5–1.5)
Methemoglobin: 0.7 % (ref 0.0–1.5)
Methemoglobin: 0.7 % (ref 0.0–1.5)
O2 Saturation: 63.9 %
O2 Saturation: 67.2 %
Total hemoglobin: 14.9 g/dL (ref 12.0–16.0)
Total hemoglobin: 14.9 g/dL (ref 12.0–16.0)

## 2019-10-11 MED ORDER — MILRINONE LACTATE IN DEXTROSE 20-5 MG/100ML-% IV SOLN: 0.1250 ug/kg/min | INTRAVENOUS | Status: DC

## 2019-10-11 NOTE — TOC Benefit Eligibility Note (Signed)
Transition of Care [facility]) Benefit Eligibility Note    Patient Details  Name: Robert [name] MRN: [id number] Date of Birth: July 20, 1980   Medication/Dose: Steffanie Dunn  5 MG BID  Covered?: Yes  Tier: 3 Drug  Prescription Coverage Preferred Pharmacy: [facility] , [facility]  Spoke with Person/Company/Phone Number:: [facility]    @ [employer] # [phone]  Co-Pay: 100 % OF TOTAL COST  Prior Approval: [phone])  Deductible: (NO DEDUCTIBLE WITH PLAN  &  OUT-OF- POCKET: UNMET)  Additional Notes: INVABRADINE : NOT   Calton Golds Phone Number: 10/11/2019, 12:12 PM

## 2019-10-11 NOTE — Progress Notes (Addendum)
Patient ID: Robert [name], male   DOB: 06-Nov-1980, 39 y.o.   MRN: [id number]     Advanced Heart Failure Rounding Note  PCP-Cardiologist: Skeet Latch, MD  AHF: Dr. Haroldine Laws    Patient Profile   39 y/o male with h/o ETOH use admitted with acute onset HF with severe biventricular failure. EF 15%. RV down. Cath shown normal coronaries. Elevated filling pressures with normal output. cMRI shows Noncompaction. Significant daily ETOH intake. CT shows small LLL PE.   Subjective:    Started on milrinone 0.25 on 5/28.   Remains on milrinone 0.25 mcg. CO-OX 64%.   Denies SOB. Able to rest. Able to walk down the hall.    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    Objective:   Weight Range: 79.6 kg Body mass index is 25.18 kg/m.   Vital Signs:   Temp:  [97.9 F (36.6 C)-98.7 F (37.1 C)] 98.5 F (36.9 C) (05/31 0737) Pulse Rate:  [95-103] 103 (05/31 0737) Resp:  [14-17] 16 (05/31 0737) BP: (91-105)/(65-77) 103/65 (05/31 0737) SpO2:  [98 %-100 %] 100 % (05/31 0737) Weight:  [79.6 kg] 79.6 kg (05/31 0039) Last BM Date: 10/10/19  Weight change: Filed Weights   10/09/19 0548 10/10/19 0156 10/11/19 0039  Weight: 79.6 kg 78.7 kg 79.6 kg    Intake/Output:   Intake/Output Summary (Last 24 hours) at 10/11/2019 0921 Last data filed at 10/11/2019 0841 Gross per 24 hour  Intake 1349.65 ml  Output 2075 ml  Net -725.35 ml      Physical Exam   CVP 6-7 General:  Well appearing. No resp difficulty HEENT: normal Neck: supple. no JVD. Carotids 2+ bilat; no bruits. No lymphadenopathy or thryomegaly appreciated. Cor: PMI nondisplaced. Regular rate & rhythm. No rubs, or murmurs. +S3  Lungs: clear Abdomen: soft, nontender, nondistended. No hepatosplenomegaly. No bruits or masses. Good bowel sounds. Extremities: no cyanosis, clubbing, rash, edema. RUE PICC Neuro: alert & orientedx3, cranial nerves grossly intact. moves all 4 extremities w/o difficulty. Affect pleasant    Telemetry   Sinus Tach 100-110s   Labs    CBC No results for input(s): WBC, NEUTROABS, HGB, HCT, MCV, PLT in the last 72 hours. Basic Metabolic Panel Recent Labs    10/09/19 0510 10/09/19 0510 10/10/19 0500 10/11/19 0415  NA 132*   < > 130* 134*  K 4.3   < > 4.4 5.2*  CL 95*   < > 95* 98  CO2 26   < > 27 26  GLUCOSE 150*   < > 127* 107*  BUN 23*   < > 28* 25*  CREATININE 1.39*   < > 1.48* 1.31*  CALCIUM 8.9   < > 8.7* 9.0  MG 1.9  --  2.2  --    < > = values in this interval not displayed.   Liver Function Tests No results for input(s): AST, ALT, ALKPHOS, BILITOT, PROT, ALBUMIN in the last 72 hours. No results for input(s): LIPASE, AMYLASE in the last 72 hours. Cardiac Enzymes No results for input(s): CKTOTAL, CKMB, CKMBINDEX, TROPONINI in the last 72 hours.  BNP: BNP (last 3 results) Recent Labs    10/04/19 1041  BNP 2,614.9*    ProBNP (last 3 results) No results for input(s): PROBNP in the last 8760 hours.   D-Dimer No results for input(s): DDIMER in the last 72 hours. Hemoglobin A1C No results for input(s): HGBA1C in the last 72 hours. Fasting Lipid Panel No results for input(s): CHOL, HDL, LDLCALC,  TRIG, CHOLHDL, LDLDIRECT in the last 72 hours. Thyroid Function Tests No results for input(s): TSH, T4TOTAL, T3FREE, THYROIDAB in the last 72 hours.  Invalid input(s): FREET3  Other results:   Imaging    No results found.   Medications:     Scheduled Medications: . Chlorhexidine Gluconate Cloth  6 each Topical Daily  . digoxin  0.125 mg Oral Daily  . folic acid  1 mg Oral Daily  . ivabradine  5 mg Oral BID WC  . losartan  12.5 mg Oral BID  . multivitamin with minerals  1 tablet Oral Daily  . rivaroxaban  15 mg Oral BID   Followed by  . [START ON 10/28/2019] rivaroxaban  20 mg Oral Daily  . sodium chloride flush  10-40 mL Intracatheter Q12H  . spironolactone  12.5 mg Oral Daily  . thiamine  100 mg Oral Daily    Infusions: . milrinone  0.25 mcg/kg/min (10/11/19 0636)    PRN Medications: acetaminophen, diazepam, iohexol, ondansetron (ZOFRAN) IV, ondansetron **OR** [DISCONTINUED] ondansetron (ZOFRAN) IV, sodium chloride flush     Assessment/Plan   1. Acute Systolic Heart Failure, NICM  - ECHO EF 15% RV moderately reduced. Suspect ETOH and cocaine playing a role.  - LHC with no coronary disease. 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 (will need outpatient genetic testing) - Milrinone started on 5/28. Now on milrinone 0.25.  -CO-OX stable. Cut back milrinone 0.125 mcg.   - Continue digoxin 0.125 mg daily  - Continue spiro 12.5 daily.  Woulld not increase with elevated potassium. He has been eating bananas. Asked him to avoid bananas.  - Continue losartan 12.5 bid, no BP room to increase or transition to [employer].  - Corlanor 5 mg bid.  - Has had NSVT, now less frequent. Add IV amiodarone if needed. May need LifeVest at d/c. Keep K >4.0 and Mg >2.0  - consider SGLT2i.  2. Pulmonary Emboli - CTA w/ small LLL PE - LE Venous dopplers negative for DVT  - Now on xarelto - TOC consulted for cost   3. ETOH Abuse - Heavy drinker for many years. - On CIWA  - continue to follow Mg and supp as needed  - Refer to HFSW for outpatient follow up.   4. H/O Asthma  - Uses rescue inhaler but hasnt his inhaler recently.     5. H/O Cocaine Abuse - Last used 2-3 weeks ago  6. Hypomagnesia -Had mag replaced. Check mag in am.   7. Pericardial Effusion - moderate circumferential effusion on cMRI. No definite signs of tamponade on study but he is tachycardic w/ soft BP. Monitor closely. Will need repeat echo to assess for interval change, will order tomorrow.   TOC corlanor and xarelto cost.  Cardiac rehab consulted.   Length of Stay: [place], NP  10/11/2019, 9:21 AM  Advanced Heart Failure Team Pager [phone] (M-F; 7a - 4p)  Please  contact [facility] for night-coverage after hours (4p -7a ) and weekends on [email or web address]  Patient seen with NP, agree with the above note.   Co-ox good at 64% today, CVP 6-7.  SBP 90s-100s.   General: NAD Neck: No JVD, no thyromegaly or thyroid nodule.  Lungs: Clear to auscultation bilaterally with normal respiratory effort. CV: Nondisplaced PMI.  Heart regular S1/S2, no S3/S4, no murmur.  No peripheral edema.   Abdomen: Soft, nontender, no hepatosplenomegaly, no distention.  Skin: Intact without  lesions or rashes.  Neurologic: Alert and oriented x 3.  Psych: Normal affect. Extremities: No clubbing or cyanosis.  HEENT: Normal.   I will decrease milrinone to 0.125 today.  Hold diuretic with low CVP.  May be able to transition to [facility] tomorrow, watch BP.  Will not increase spironolactone today with K 5.2, asked him to cut back on bananas.   On Xarelto for PE.   Loralie Champagne 10/11/2019 9:51 AM

## 2019-10-12 ENCOUNTER — Inpatient Hospital Stay (HOSPITAL_COMMUNITY): Payer: BC Managed Care – PPO

## 2019-10-12 DIAGNOSIS — I5043 Acute on chronic combined systolic (congestive) and diastolic (congestive) heart failure: Principal | ICD-10-CM

## 2019-10-12 DIAGNOSIS — I313 Pericardial effusion (noninflammatory): Secondary | ICD-10-CM

## 2019-10-12 LAB — BASIC METABOLIC PANEL
Anion gap: 9 (ref 5–15)
BUN: 26 mg/dL — ABNORMAL HIGH (ref 6–20)
CO2: 23 mmol/L (ref 22–32)
Calcium: 9 mg/dL (ref 8.9–10.3)
Chloride: 99 mmol/L (ref 98–111)
Creatinine, Ser: 1.31 mg/dL — ABNORMAL HIGH (ref 0.61–1.24)
GFR calc Af Amer: >60 mL/min (ref >60)
GFR calc non Af Amer: >60 mL/min (ref >60)
Glucose, Bld: 109 mg/dL — ABNORMAL HIGH (ref 70–99)
Potassium: 5.4 mmol/L — ABNORMAL HIGH (ref 3.5–5.1)
Sodium: 131 mmol/L — ABNORMAL LOW (ref 135–145)

## 2019-10-12 LAB — MAGNESIUM: Magnesium: 2.3 mg/dL (ref 1.7–2.4)

## 2019-10-12 LAB — COOXEMETRY PANEL
Carboxyhemoglobin: 1.1 % (ref 0.5–1.5)
Methemoglobin: 1 % (ref 0.0–1.5)
O2 Saturation: 56.7 %
Total hemoglobin: 15.4 g/dL (ref 12.0–16.0)

## 2019-10-12 LAB — ECHOCARDIOGRAM LIMITED
Height: 70 in
Weight: 2849.6 oz

## 2019-10-12 MED ORDER — SODIUM ZIRCONIUM CYCLOSILICATE 5 G PO PACK
5.0000 g | PACK | Freq: Once | ORAL | Status: AC
  Administered 2019-10-12: 5 g via ORAL
  Filled 2019-10-12: qty 1

## 2019-10-12 MED ORDER — SPIRONOLACTONE 12.5 MG HALF TABLET
12.5000 mg | ORAL_TABLET | Freq: Every day | ORAL | Status: DC
  Administered 2019-10-12: 12.5 mg via ORAL
  Filled 2019-10-12: qty 1

## 2019-10-12 NOTE — Progress Notes (Signed)
CARDIAC REHAB PHASE I   PRE:  Rate/Rhythm: 101 ST    BP: sitting 105/88    SaO2:   MODE:  Ambulation: 790 ft   POST:  Rate/Rhythm: 111 ST    BP: sitting 107/88     SaO2:   Tolerated well at quick pace. No c/o until he sat back and became more SOB. HR more controlled. Discussed/reviewed HF booklet. Pt could perform teach back and asked appropriate questions. Gave exercise guidelines. Encouraged more walking. Also gave HF video to view. [place], ACSM 10/12/2019 3:00 PM

## 2019-10-12 NOTE — Progress Notes (Signed)
  Echocardiogram 2D Echocardiogram has been performed.  Robert [name] 10/12/2019, 4:11 PM

## 2019-10-12 NOTE — Discharge Summary (Signed)
Advanced Heart Failure Team  Discharge Summary   Patient ID: Robert [name] MRN: [id number], DOB/AGE: August 13, 1980 39 y.o. Admit date: 10/04/2019 D/C date:     10/13/2019   Primary Discharge Diagnoses:  Acute on chronic systolic heart failure, w/ biventricular dysfunction and low output  LLL PE Moderate Pericardial Effusion  ETOH abuse  Cocaine use     Hospital Course:   38 y/o male with h/o heavy ETOH and recent cocaine use but no other prior PMH admitted 10/04/19 with acute onset HF with severe biventricular failure. EF 15%. RV down. Moderate pericardial effusion noted. R/LHC showed normal coronaries, elevated filling pressures with normal output. cMRI showed Noncompaction. Chest CT also showed small LLL PE. Started on Xarelto.   On 5/28, he developed s/s concerning for low output. PICC line was placed and showed low co-ox at 55%. CVP elevated. He was started on milrinone and diuretics were increased.   2D echo was repeated on 6/1 to reassess pericardial effusion. This showed no interval change. Pericardial effusion remains moderate in size. No tamponade physiology.   He was weaned off milrinone w/ marginal co-ox at 53%. Unfortunately, not a candidate for home milrinone or other advanced therapies currently given active substance abuse.   He was placed on GDMT for systolic HF w/ losartan, digoxin and corlanor. He did not tolerate spironolactone due to hyperkalemia. BP was too soft for Entresto and Bidil. No  blocker w/ low output. Unfortunately, he did not meet inclusion criteria for DAPA trial. He diuresed well w/ IV Lasix and placed on PO Lasix 20 mg daily w/ stable SCr.   On 6/2, he was seen and examined by Dr. Aundra Dubin and felt stable for d/c home. He was provided Xarelto starter pack for his PE. He is scheduled for f/u BMP in 6/4. Post hospital f/u 6/14.      Discharge Weight Range: 178 lb  Discharge Vitals: Blood pressure 102/65, pulse 96, temperature 98.4 F (36.9 C), temperature  source Oral, resp. rate 15, height 5\' 10"  (1.778 m), weight 80.7 kg, SpO2 100 %.  Labs: Lab Results  Component Value Date   WBC 5.6 10/07/2019   HGB 13.8 10/07/2019   HCT 43.2 10/07/2019   MCV 94.5 10/07/2019   PLT 275 10/07/2019    Recent Labs  Lab 10/13/19 1200  NA 135  K 4.7  CL 100  CO2 24  BUN 29*  CREATININE 1.36*  CALCIUM 9.0  GLUCOSE 85   No results found for: CHOL, HDL, LDLCALC, TRIG BNP (last 3 results) Recent Labs    10/04/19 1041  BNP 2,614.9*    ProBNP (last 3 results) No results for input(s): PROBNP in the last 8760 hours.   Diagnostic Studies/Procedures   ECHOCARDIOGRAM LIMITED  Result Date: 10/12/2019    ECHOCARDIOGRAM LIMITED REPORT   Patient Name:   Robert [name] Date of Exam: 10/12/2019 Medical Rec #:  [id number]    Height:       70.0 in Accession #:    [id number]   Weight:       178.1 lb Date of Birth:  [birth year]     BSA:          1.987 m Patient Age:    39 years     BP:           93/73 mmHg Patient Gender: M            HR:           109 bpm. Exam Location:  Inpatient Procedure: Limited Echo, Color Doppler and Cardiac Doppler Indications:    pericardial effusion  History:        Patient has prior history of Echocardiogram examinations, most                 recent 10/05/2019. CHF; Risk Factors:Current Smoker.  Sonographer:    Johny Chess Referring Phys: [place]  1. Left ventricular ejection fraction, by estimation, is <20%. The left ventricle has severely decreased function. The left ventricle demonstrates global hypokinesis. The left ventricular internal cavity size was moderately to severely dilated. Left ventricular diastolic function could not be evaluated.  2. Moderate pericardial effusion. There is no evidence of cardiac tamponade.  3. The mitral valve is normal in structure. Mild mitral valve regurgitation.  4. The aortic valve is tricuspid. Aortic valve regurgitation is not visualized. No aortic stenosis is present.  5. There  is normal pulmonary artery systolic pressure.  6. The inferior vena cava is normal in size with <50% respiratory variability, suggesting right atrial pressure of 8 mmHg. Conclusion(s)/Recommendation(s): Pericardial effusion stable to slightly smaller. Rest of echo without significant change from prior. FINDINGS  Left Ventricle: Left ventricular ejection fraction, by estimation, is <20%. The left ventricle has severely decreased function. The left ventricle demonstrates global hypokinesis. The left ventricular internal cavity size was moderately to severely dilated. Right Ventricle: There is normal pulmonary artery systolic pressure. The tricuspid regurgitant velocity is 2.62 m/s, and with an assumed right atrial pressure of 8 mmHg, the estimated right ventricular systolic pressure is 36.1 mmHg. Pericardium: A moderately sized pericardial effusion is present. There is no evidence of cardiac tamponade. Mitral Valve: The mitral valve is normal in structure. Mild mitral valve regurgitation. Tricuspid Valve: The tricuspid valve is normal in structure. Tricuspid valve regurgitation is mild. Aortic Valve: The aortic valve is tricuspid. Aortic valve regurgitation is not visualized. No aortic stenosis is present. Pulmonic Valve: The pulmonic valve was grossly normal. Pulmonic valve regurgitation is trivial. No evidence of pulmonic stenosis. Aorta: The aortic root and ascending aorta are structurally normal, with no evidence of dilitation. Venous: The inferior vena cava is normal in size with less than 50% respiratory variability, suggesting right atrial pressure of 8 mmHg.  LEFT VENTRICLE PLAX 2D LVIDd:         6.80 cm LVIDs:         6.32 cm LV PW:         0.92 cm LV IVS:        0.64 cm LVOT diam:     2.20 cm LVOT Area:     3.80 cm  IVC IVC diam: 1.89 cm LEFT ATRIUM         Index LA diam:    5.00 cm 2.52 cm/m  TRICUSPID VALVE TR Peak grad:   27.5 mmHg TR Vmax:        262.00 cm/s  SHUNTS Systemic Diam: 2.20 cm Buford Dresser MD Electronically signed by Buford Dresser MD Signature Date/Time: 10/12/2019/5:30:19 PM    Final     Discharge Medications   Allergies as of 10/13/2019   No Known Allergies     Medication List    STOP taking these medications   ondansetron 4 MG tablet Commonly known as: ZOFRAN   sucralfate 1 GM/10ML suspension Commonly known as: Carafate     TAKE these medications   digoxin 0.125 MG tablet Commonly known as: LANOXIN Take 1 tablet (0.125 mg total) by mouth daily. Start taking  on: October 14, 6718   folic acid 1 MG tablet Commonly known as: FOLVITE Take 1 tablet (1 mg total) by mouth daily. Start taking on: October 14, 2019   furosemide 20 MG tablet Commonly known as: LASIX Take 1 tablet (20 mg total) by mouth daily. Start taking on: October 14, 2019   ivabradine 5 MG Tabs tablet Commonly known as: CORLANOR Take 1 tablet (5 mg total) by mouth 2 (two) times daily with a meal.   losartan 25 MG tablet Commonly known as: COZAAR Take 0.5 tablets (12.5 mg total) by mouth 2 (two) times daily.   omeprazole 20 MG capsule Commonly known as: PRILOSEC Take 1 capsule (20 mg total) by mouth daily.   Rivaroxaban Stater Pack (15 mg and 20 mg) Commonly known as: XARELTO STARTER PACK Follow package directions: Take one 15mg  tablet by mouth twice a day. On day 22, switch to one 20mg  tablet once a day. Take with food.   rivaroxaban 20 MG Tabs tablet Commonly known as: XARELTO Take 1 tablet (20 mg total) by mouth daily. Start taking on: October 28, 2019       Disposition   The patient will be discharged in stable condition to home. Discharge Instructions    Amb Referral to Cardiac Rehabilitation   Complete by: As directed    Diagnosis: Heart Failure (see criteria below if ordering Phase II)   Heart Failure Type: Chronic Systolic & Diastolic   After initial evaluation and assessments completed: Virtual Based Care may be provided alone or in conjunction with Phase 2 Cardiac  Rehab based on patient barriers.: Yes     Follow-up Information    [facility] [place] Follow up on 10/15/2019.   Specialty: Cardiology Why: Advanced Heart Failure Clinic Lab Appointment 10:00 AM Contact information: [address] [id number] [place]       Bensimhon, Shaune Pascal, MD Follow up on 10/25/2019.   Specialty: Cardiology Why: Advanced Heart Failure Clinic 11:30 AM Parking Garage Code [id number]  Contact information: [address] [place] [address] [phone]             Duration of Discharge Encounter: Greater than 35 minutes   Signed, Nelida Gores  10/13/2019, 5:24 PM

## 2019-10-12 NOTE — Plan of Care (Signed)
  Problem: Health Behavior/Discharge Planning: Goal: Ability to manage health-related needs will improve Outcome: Progressing   Problem: Clinical Measurements: Goal: Ability to maintain clinical measurements within normal limits will improve Outcome: Progressing Goal: Will remain free from infection Outcome: Progressing Goal: Diagnostic test results will improve Outcome: Progressing Goal: Respiratory complications will improve Outcome: Progressing Goal: Cardiovascular complication will be avoided Outcome: Progressing   Problem: Activity: Goal: Risk for activity intolerance will decrease Outcome: Progressing   Problem: Nutrition: Goal: Adequate nutrition will be maintained Outcome: Progressing   Problem: Coping: Goal: Level of anxiety will decrease Outcome: Progressing   Problem: Elimination: Goal: Will not experience complications related to bowel motility Outcome: Progressing Goal: Will not experience complications related to urinary retention Outcome: Progressing   Problem: Pain Managment: Goal: General experience of comfort will improve Outcome: Progressing   Problem: Safety: Goal: Ability to remain free from injury will improve Outcome: Progressing   Problem: Skin Integrity: Goal: Risk for impaired skin integrity will decrease Outcome: Progressing   Problem: Education: Goal: Ability to demonstrate management of disease process will improve Outcome: Progressing Goal: Ability to verbalize understanding of medication therapies will improve Outcome: Progressing Goal: Individualized Educational Video(s) Outcome: Progressing   Problem: Activity: Goal: Capacity to carry out activities will improve Outcome: Progressing   Problem: Cardiac: Goal: Ability to achieve and maintain adequate cardiopulmonary perfusion will improve Outcome: Progressing   

## 2019-10-12 NOTE — Progress Notes (Addendum)
Patient ID: Robert [name], male   DOB: 11-03-1980, 39 y.o.   MRN: [id number]     Advanced Heart Failure Rounding Note  PCP-Cardiologist: Skeet Latch, MD  AHF: Dr. Haroldine Laws    Patient Profile   40 y/o male with h/o ETOH use admitted with acute onset HF with severe biventricular failure. EF 15%. RV down. Cath shown normal coronaries. Elevated filling pressures with normal output. cMRI shows Noncompaction. Significant daily ETOH intake. CT shows small LLL PE.   Subjective:    Started on milrinone 0.25 on 5/28.   Yesterday milrinone cut back to 0.125 mcg. CO-OX 57%  Denies SOB.   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    Objective:   Weight Range: 80.8 kg Body mass index is 25.55 kg/m.   Vital Signs:   Temp:  [97.4 F (36.3 C)-98.9 F (37.2 C)] 98.9 F (37.2 C) (06/01 0739) Pulse Rate:  [90-109] 100 (06/01 0800) Resp:  [16-18] 16 (06/01 0739) BP: (89-101)/(59-76) 92/75 (06/01 0739) SpO2:  [98 %-100 %] 99 % (06/01 0739) Weight:  [80.8 kg] 80.8 kg (06/01 0427) Last BM Date: 10/11/19  Weight change: Filed Weights   10/10/19 0156 10/11/19 0039 10/12/19 0427  Weight: 78.7 kg 79.6 kg 80.8 kg    Intake/Output:   Intake/Output Summary (Last 24 hours) at 10/12/2019 0922 Last data filed at 10/12/2019 0839 Gross per 24 hour  Intake 1103.87 ml  Output 2000 ml  Net -896.13 ml      Physical Exam   CVP 4-5  General: No resp difficulty HEENT: normal Neck: supple. no JVD. Carotids 2+ bilat; no bruits. No lymphadenopathy or thryomegaly appreciated. Cor: PMI nondisplaced. Regular rate & rhythm. No rubs, gallops or murmurs. Lungs: clear Abdomen: soft, nontender, nondistended. No hepatosplenomegaly. No bruits or masses. Good bowel sounds. Extremities: no cyanosis, clubbing, rash, edema. RUE PICC  Neuro: alert & orientedx3, cranial nerves grossly intact. moves all 4 extremities w/o difficulty. Affect pleasant   Telemetry   SR/ST 90-100s   Labs    CBC No  results for input(s): WBC, NEUTROABS, HGB, HCT, MCV, PLT in the last 72 hours. Basic Metabolic Panel Recent Labs    10/10/19 0500 10/10/19 0500 10/11/19 0415 10/12/19 0459  NA 130*   < > 134* 131*  K 4.4   < > 5.2* 5.4*  CL 95*   < > 98 99  CO2 27   < > 26 23  GLUCOSE 127*   < > 107* 109*  BUN 28*   < > 25* 26*  CREATININE 1.48*   < > 1.31* 1.31*  CALCIUM 8.7*   < > 9.0 9.0  MG 2.2  --   --   --    < > = values in this interval not displayed.   Liver Function Tests No results for input(s): AST, ALT, ALKPHOS, BILITOT, PROT, ALBUMIN in the last 72 hours. No results for input(s): LIPASE, AMYLASE in the last 72 hours. Cardiac Enzymes No results for input(s): CKTOTAL, CKMB, CKMBINDEX, TROPONINI in the last 72 hours.  BNP: BNP (last 3 results) Recent Labs    10/04/19 1041  BNP 2,614.9*    ProBNP (last 3 results) No results for input(s): PROBNP in the last 8760 hours.   D-Dimer No results for input(s): DDIMER in the last 72 hours. Hemoglobin A1C No results for input(s): HGBA1C in the last 72 hours. Fasting Lipid Panel No results for input(s): CHOL, HDL, LDLCALC, TRIG, CHOLHDL, LDLDIRECT in the last 72 hours. Thyroid Function  Tests No results for input(s): TSH, T4TOTAL, T3FREE, THYROIDAB in the last 72 hours.  Invalid input(s): FREET3  Other results:   Imaging    No results found.   Medications:     Scheduled Medications: . Chlorhexidine Gluconate Cloth  6 each Topical Daily  . digoxin  0.125 mg Oral Daily  . folic acid  1 mg Oral Daily  . ivabradine  5 mg Oral BID WC  . losartan  12.5 mg Oral BID  . multivitamin with minerals  1 tablet Oral Daily  . rivaroxaban  15 mg Oral BID   Followed by  . [START ON 10/28/2019] rivaroxaban  20 mg Oral Daily  . sodium chloride flush  10-40 mL Intracatheter Q12H  . spironolactone  12.5 mg Oral Daily  . thiamine  100 mg Oral Daily    Infusions: . milrinone 0.125 mcg/kg/min (10/11/19 1816)    PRN  Medications: acetaminophen, diazepam, iohexol, ondansetron (ZOFRAN) IV, ondansetron **OR** [DISCONTINUED] ondansetron (ZOFRAN) IV, sodium chloride flush     Assessment/Plan   1. Acute Systolic Heart Failure, NICM  - ECHO EF 15% RV moderately reduced. Suspect ETOH and cocaine playing a role.  - LHC with no coronary disease. 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 (will need outpatient genetic testing) - Milrinone started on 5/28. Now on milrinone 0.25.  -CO-OX stable. Stop milrinone  - CVP low.  - Continue digoxin 0.125 mg daily  - Continue spiro 12.5 daily.   - K higher today. Give dose of lokelma - Continue losartan 12.5 bid, no BP room to increase or transition to [employer].  - Corlanor 5 mg bid.  - Has had NSVT, now less frequent. Add IV amiodarone if needed. May need LifeVest at d/c. Keep K >4.0 and Mg >2.0  - consider SGLT2i.  2. Pulmonary Emboli - CTA w/ small LLL PE - LE Venous dopplers negative for DVT  - Now on xarelto - TOC consulted for cost -->125.00   3. ETOH Abuse - Heavy drinker for many years.  Needs to stop completely.  - On CIWA  - continue to follow Mg and supp as needed  - Refer to [facility] for outpatient follow up.   4. H/O Asthma  - Uses rescue inhaler but hasnt his inhaler recently.     5. H/O Cocaine Abuse - Last used 2-3 weeks ago  6. Hypomagnesia -Had mag replaced. Check mag today   7. Pericardial Effusion - moderate circumferential effusion on cMRI. No definite signs of tamponade on study but he is tachycardic w/ soft BP. Monitor closely.  Repeat ECHO today to assess for interval change  8. Hyperkalemia  K elevated. Discussed avoid high potassium foods. Give dose of lokelma.  Check K in am   TOC Xarelto 125.65 co pay. ? May need to switch to eliquis if coverage better. I will check on that. TOC corlanor covered 100%.  Cardiac rehab consulted.   Length of Stay:  [place], NP  10/12/2019, 9:22 AM  Advanced Heart Failure Team Pager [phone] (M-F; 7a - 4p)  Please contact [facility] for night-coverage after hours (4p -7a ) and weekends on [email or web address]  Patient seen with NP, agree with the above note.   No complaints today, no dyspnea.  Walked in hall.  Co-ox 57%, CVP 4-5.  Creatinine stable at 1.3, K still elevated 5.4.    General: NAD Neck: No JVD, no thyromegaly or thyroid nodule.  Lungs: Clear to auscultation bilaterally with normal respiratory effort. CV: Lateral PMI.  Heart regular S1/S2, no S3/S4, no murmur.  No peripheral edema.    Abdomen: Soft, nontender, no hepatosplenomegaly, no distention.  Skin: Intact without lesions or rashes.  Neurologic: Alert and oriented x 3.  Psych: Normal affect. Extremities: No clubbing or cyanosis.  HEENT: Normal.   SBP 90s, no room to titrate meds.  Will stop milrinone today.  No Lasix today with low CVP.  Will give a dose of Lokelma with K 5.4 to get it down, hopefully can control in future by starting Lasix (probably start Lasix 20 mg daily tomorrow morning).   Will screen for DAPA-HF trial.   Hopefully home tomorrow.   Loralie Champagne 10/12/2019 2:13 PM

## 2019-10-13 ENCOUNTER — Other Ambulatory Visit: Payer: Self-pay

## 2019-10-13 LAB — BASIC METABOLIC PANEL
Anion gap: 8 (ref 5–15)
Anion gap: 11 (ref 5–15)
BUN: 32 mg/dL — ABNORMAL HIGH (ref 6–20)
BUN: 29 mg/dL — ABNORMAL HIGH (ref 6–20)
CO2: 22 mmol/L (ref 22–32)
CO2: 24 mmol/L (ref 22–32)
Calcium: 8.8 mg/dL — ABNORMAL LOW (ref 8.9–10.3)
Calcium: 9 mg/dL (ref 8.9–10.3)
Chloride: 102 mmol/L (ref 98–111)
Chloride: 100 mmol/L (ref 98–111)
Creatinine, Ser: 1.2 mg/dL (ref 0.61–1.24)
Creatinine, Ser: 1.36 mg/dL — ABNORMAL HIGH (ref 0.61–1.24)
GFR calc Af Amer: >60 mL/min (ref >60)
GFR calc Af Amer: >60 mL/min (ref >60)
GFR calc non Af Amer: >60 mL/min (ref >60)
GFR calc non Af Amer: >60 mL/min (ref >60)
Glucose, Bld: 99 mg/dL (ref 70–99)
Glucose, Bld: 85 mg/dL (ref 70–99)
Potassium: 5.7 mmol/L — ABNORMAL HIGH (ref 3.5–5.1)
Potassium: 4.7 mmol/L (ref 3.5–5.1)
Sodium: 132 mmol/L — ABNORMAL LOW (ref 135–145)
Sodium: 135 mmol/L (ref 135–145)

## 2019-10-13 LAB — COOXEMETRY PANEL
Carboxyhemoglobin: 0.7 % (ref 0.5–1.5)
Methemoglobin: 0.8 % (ref 0.0–1.5)
O2 Saturation: 53.2 %
Total hemoglobin: 14.9 g/dL (ref 12.0–16.0)

## 2019-10-13 LAB — DIGOXIN LEVEL: Digoxin Level: 0.3 ng/mL — ABNORMAL LOW (ref 0.8–2.0)

## 2019-10-13 MED ORDER — FUROSEMIDE 20 MG PO TABS: 20.0000 mg | ORAL_TABLET | Freq: Every day | ORAL | 5 refills | Status: DC

## 2019-10-13 MED ORDER — SODIUM ZIRCONIUM CYCLOSILICATE 10 G PO PACK
10.0000 g | PACK | ORAL | Status: AC
  Administered 2019-10-13: 10 g via ORAL
  Filled 2019-10-13: qty 1

## 2019-10-13 MED ORDER — IVABRADINE HCL 5 MG PO TABS: 5.0000 mg | ORAL_TABLET | Freq: Two times a day (BID) | ORAL | 5 refills | Status: DC

## 2019-10-13 MED ORDER — DIGOXIN 125 MCG PO TABS: 0.1250 mg | ORAL_TABLET | Freq: Every day | ORAL | 5 refills | Status: DC

## 2019-10-13 MED ORDER — RIVAROXABAN (XARELTO) VTE STARTER PACK (15 & 20 MG)
ORAL_TABLET | ORAL | 0 refills | Status: DC
Start: 2019-10-13 — End: 2019-10-13

## 2019-10-13 MED ORDER — FUROSEMIDE 20 MG PO TABS
20.0000 mg | ORAL_TABLET | Freq: Every day | ORAL | Status: DC
  Administered 2019-10-13: 20 mg via ORAL
  Filled 2019-10-13: qty 1

## 2019-10-13 MED ORDER — RIVAROXABAN (XARELTO) VTE STARTER PACK (15 & 20 MG): ORAL_TABLET | ORAL | 0 refills | Status: DC

## 2019-10-13 MED ORDER — FOLIC ACID 1 MG PO TABS: 1.0000 mg | ORAL_TABLET | Freq: Every day | ORAL | 3 refills | Status: DC

## 2019-10-13 MED ORDER — RIVAROXABAN 20 MG PO TABS: 20.0000 mg | ORAL_TABLET | Freq: Every day | ORAL | 5 refills | Status: DC

## 2019-10-13 MED ORDER — SODIUM ZIRCONIUM CYCLOSILICATE 10 G PO PACK: 10.0000 g | PACK | Freq: Once | ORAL | Status: DC

## 2019-10-13 MED ORDER — LOSARTAN POTASSIUM 25 MG PO TABS: 12.5000 mg | ORAL_TABLET | Freq: Two times a day (BID) | ORAL | 5 refills | Status: DC

## 2019-10-13 MED FILL — XARELTO STARTER PACK: 15 & 20 | 30 days supply | Qty: 51 | Fill #0

## 2019-10-13 NOTE — Plan of Care (Signed)
Problem: Health Behavior/Discharge Planning: Goal: Ability to manage health-related needs will improve 10/13/2019 1632 by Edmonia Lynch, RN Outcome: Adequate for Discharge 10/13/2019 1632 by Edmonia Lynch, RN Outcome: Adequate for Discharge   Problem: Clinical Measurements: Goal: Ability to maintain clinical measurements within normal limits will improve 10/13/2019 1632 by Edmonia Lynch, RN Outcome: Adequate for Discharge 10/13/2019 1632 by Edmonia Lynch, RN Outcome: Adequate for Discharge Goal: Will remain free from infection 10/13/2019 1632 by Edmonia Lynch, RN Outcome: Adequate for Discharge 10/13/2019 1632 by Edmonia Lynch, RN Outcome: Adequate for Discharge Goal: Diagnostic test results will improve 10/13/2019 1632 by Edmonia Lynch, RN Outcome: Adequate for Discharge 10/13/2019 1632 by Edmonia Lynch, RN Outcome: Adequate for Discharge Goal: Respiratory complications will improve 10/13/2019 1632 by Edmonia Lynch, RN Outcome: Adequate for Discharge 10/13/2019 1632 by Edmonia Lynch, RN Outcome: Adequate for Discharge Goal: Cardiovascular complication will be avoided 10/13/2019 1632 by Edmonia Lynch, RN Outcome: Adequate for Discharge 10/13/2019 1632 by Edmonia Lynch, RN Outcome: Adequate for Discharge   Problem: Activity: Goal: Risk for activity intolerance will decrease 10/13/2019 1632 by Edmonia Lynch, RN Outcome: Adequate for Discharge 10/13/2019 1632 by Edmonia Lynch, RN Outcome: Adequate for Discharge   Problem: Nutrition: Goal: Adequate nutrition will be maintained 10/13/2019 1632 by Edmonia Lynch, RN Outcome: Adequate for Discharge 10/13/2019 1632 by Edmonia Lynch, RN Outcome: Adequate for Discharge   Problem: Coping: Goal: Level of anxiety will decrease 10/13/2019 1632 by Edmonia Lynch, RN Outcome: Adequate for Discharge 10/13/2019 1632 by Edmonia Lynch, RN Outcome: Adequate for Discharge   Problem: Elimination: Goal: Will not experience  complications related to bowel motility 10/13/2019 1632 by Edmonia Lynch, RN Outcome: Adequate for Discharge 10/13/2019 1632 by Edmonia Lynch, RN Outcome: Adequate for Discharge Goal: Will not experience complications related to urinary retention 10/13/2019 1632 by Edmonia Lynch, RN Outcome: Adequate for Discharge 10/13/2019 1632 by Edmonia Lynch, RN Outcome: Adequate for Discharge   Problem: Pain Managment: Goal: General experience of comfort will improve 10/13/2019 1632 by Edmonia Lynch, RN Outcome: Adequate for Discharge 10/13/2019 1632 by Edmonia Lynch, RN Outcome: Adequate for Discharge   Problem: Safety: Goal: Ability to remain free from injury will improve 10/13/2019 1632 by Edmonia Lynch, RN Outcome: Adequate for Discharge 10/13/2019 1632 by Edmonia Lynch, RN Outcome: Adequate for Discharge   Problem: Skin Integrity: Goal: Risk for impaired skin integrity will decrease 10/13/2019 1632 by Edmonia Lynch, RN Outcome: Adequate for Discharge 10/13/2019 1632 by Edmonia Lynch, RN Outcome: Adequate for Discharge   Problem: Education: Goal: Ability to demonstrate management of disease process will improve 10/13/2019 1632 by Edmonia Lynch, RN Outcome: Adequate for Discharge 10/13/2019 1632 by Edmonia Lynch, RN Outcome: Adequate for Discharge Goal: Ability to verbalize understanding of medication therapies will improve 10/13/2019 1632 by Edmonia Lynch, RN Outcome: Adequate for Discharge 10/13/2019 1632 by Edmonia Lynch, RN Outcome: Adequate for Discharge Goal: Individualized Educational Video(s) 10/13/2019 1632 by Edmonia Lynch, RN Outcome: Adequate for Discharge 10/13/2019 1632 by Edmonia Lynch, RN Outcome: Adequate for Discharge   Problem: Activity: Goal: Capacity to carry out activities will improve 10/13/2019 1632 by Edmonia Lynch, RN Outcome: Adequate for Discharge 10/13/2019 1632 by Edmonia Lynch, RN Outcome: Adequate for Discharge   Problem: Cardiac: Goal:  Ability to achieve and maintain adequate cardiopulmonary perfusion will improve 10/13/2019 1632 by Lianne Cure, Chester Sibert  G, RN Outcome: Adequate for Discharge 10/13/2019 1632 by Edmonia Lynch, RN Outcome: Adequate for Discharge

## 2019-10-13 NOTE — Research (Signed)
   Screening Visit Worksheet  PATIENT ID: _____[id number]____________________   DATE OF VISIT:  10/13/2019   Date and time the ICF was signed:       Date:10/13/2019   Time: 1011   Version: _2_______          Individual who consented patient:  ___Rachel Young,RN          '[x]'$  ICF process completed according to SOPs. If not, please add comment below:  ____________________________________________________________________________  1. Demography   Year of Birth  69  Age at time of informed consent 77    Gender  '[x]'$  M '[]'$  F  Woman of Childbearing Potential '[]'$  Yes*       '[]'$  No  *If Yes:  Date of Pregnancy Test:  ______/ ______/_______   Results of Pregnancy Test (Positive or Negative):  _________________  Ethnicity Not Hispanic or Latino  Race  Black or African American  2. Confirmed all Inclusion Criteria for enrollment are met   '[]'$  Yes      '[x]'$  No patient screenfailed due to Inclusion #5  3. Confirmed none of the Exclusion Criteria are present  '[]'$  Yes       '[x]'$  No  4. Vital Signs  Pulse __99______________   Height ____70____________ '[]'$  cm  '[x]'$  in (xxx.xx)  B/P __101/75______________  Body Weight ___178______ '[]'$  kg   '[x]'$  lbs  (xxx.xx)    5.  Specific Medical History  A. Established History of HFrEF     '[x]'$  Yes  '[]'$  No B. Most Recent LVEF (including current hospitalization)   20% C. NT-proBNP during current hospitalization    ________   units _____ D. BNP during current hospitalization    2614.9  units pg/ml E. Diagnosis of Type 2 Diabetes Mellitus (T2DM):   '[]'$  Yes   '[x]'$  No  F. Please note any other relevant medical history in the space below: ___________asthma_________________________________________________________________________________________________________________________________________________________________________________________________   6.  Concomitant medications  Request information on all heart failure and/or diabetes medications patient was taking  prior to admission and record on the specific eCRF pages.  7.  Local Labs Performed  . BNP or NT-proBNP  '[x]'$  Yes  '[]'$  No . Creatinine    '[x]'$  Yes  '[]'$  No . Potassium    '[x]'$  Yes  '[]'$  No  8. Is patient fully eligible at the time of screening and will be randomized the same day?  9. '[]'$  Yes   '[x]'$  No patient screen failed because they did not meet Inclusion #5  If no,  '[]'$  Schedule Randomization Visit within 7 days of hospital admission (Date:_________________)  10. Was KCCQ questionnaire completed?  '[x]'$  Yes  '[]'$  No   11. Was patient successfully randomized?  '[]'$  Yes  '[x]'$  No     12. Study Drug Dispensed  '[]'$  Assign Study Drug via IRT  Bottle number 1:    Bottle number 2:    13.  Other Items  '[]'$  Schedule Week 1 Clinic Visit          '[]'$  Schedule Month 1 Clinic Visit __________________________  '[]'$  Provide patient a copy of the signed ICF, patient ID card, patient medication instructions and  any other patient material  '[]'$  Remind patient of importance of complying with study medication         '[]'$  Remind patient to bring all study medication and empty containers to next clinic visit

## 2019-10-13 NOTE — TOC Progression Note (Signed)
Transition of Care [facility]) - Progression Note    Patient Details  Name: Robert [name] MRN: [id number] Date of Birth: 1980-10-15  [facility]) CM/SW Contact  Zenon Mayo, RN Phone Number: 10/13/2019, 3:55 PM  Clinical Narrative:    NCM spoke with patient, TOC will deliver the starter pack for xarelto to patient, he has samples of the corlanor also , HeartFailure team is helping with getting this prior auth per [place] PA. He will pick up the rest of his meds at [facility].  He has the 10.00 copay card for the refill for xarelto.         Expected Discharge Plan and Services           Expected Discharge Date: 10/13/19                                     Social Determinants of Health (SDOH) Interventions    Readmission Risk Interventions No flowsheet data found.

## 2019-10-13 NOTE — Progress Notes (Signed)
Right arm DL PICC removed per protocol for pt's discharge. Manual pressure applied for 5 mins. No bleeding or swelling noted. Instructed patient to remain in bed until 1650. Educated patient about S/S of infection and when to call MD; no heavy lifting or pressure on right side for 24 hours; keep dressing dry and intact until 1800 tomorrow. Pt verbalized comprehension.

## 2019-10-13 NOTE — Research (Signed)
Patient is unfortunately a screen failure for the DAPA research study. Patient did not meet all inclusion criteria. Randomization must occur within 7 days of hospitalization and this is day 9.

## 2019-10-13 NOTE — Progress Notes (Addendum)
Patient ID: Robert [name], male   DOB: January 18, 1981, 39 y.o.   MRN: [id number]     Advanced Heart Failure Rounding Note  PCP-Cardiologist: Skeet Latch, MD  AHF: Dr. Haroldine Laws    Patient Profile   39 y/o male with h/o ETOH use admitted with acute onset HF with severe biventricular failure. EF 15%. RV down. Cath shown normal coronaries. Elevated filling pressures with normal output. cMRI shows Noncompaction. Significant daily ETOH intake. CT shows small LLL PE.   Subjective:    Milrinone stopped 6/1. Co-ox down to 53%. He remains tachycardic at rest, low 100s-110s. On 5 of corlanor bid.   K 5.7 on am labs. Dose of Lokelma given.    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   Initial 2D echo showed moderate sized circumferential pericardial effusion. Repeat limited echo 6/1 showed no interval change. Pericardial effusion remains moderate in size. No tamponade physiology.  LVEF <20%. RV ok. RA pressure 8 mmHg.   SBPs 90-101. CVP ~10   He denies resting dyspnea. No further orthopnea/PND but was mildly SOB after ambulating w/ CR yesterday. Has not ambulated much today.   Objective:   Weight Range: 80.7 kg Body mass index is 25.54 kg/m.   Vital Signs:   Temp:  [97.6 F (36.4 C)-98.6 F (37 C)] 98.6 F (37 C) (06/02 1110) Pulse Rate:  [97-101] 97 (06/02 1110) Resp:  [13-20] 18 (06/02 1110) BP: (90-101)/(65-75) 96/75 (06/02 1110) SpO2:  [95 %-100 %] 100 % (06/02 1110) Weight:  [80.7 kg] 80.7 kg (06/02 0056) Last BM Date: 10/12/19  Weight change: Filed Weights   10/11/19 0039 10/12/19 0427 10/13/19 0056  Weight: 79.6 kg 80.8 kg 80.7 kg    Intake/Output:   Intake/Output Summary (Last 24 hours) at 10/13/2019 1216 Last data filed at 10/13/2019 1127 Gross per 24 hour  Intake 1680 ml  Output 2700 ml  Net -1020 ml      Physical Exam  CVP ~10  General: No resp difficulty HEENT: normal Neck: supple. JVD ~8-9 cm. Carotids 2+ bilat; no bruits.  No lymphadenopathy or thryomegaly appreciated. Cor: PMI nondisplaced. Regular rhythm, tachy rate. No rubs, gallops or murmurs. Lungs: clear Abdomen: soft, nontender, nondistended. No hepatosplenomegaly. No bruits or masses. Good bowel sounds. Extremities: no cyanosis, clubbing, rash, edema. Warm. RUE PICC  Neuro: alert & orientedx3, cranial nerves grossly intact. moves all 4 extremities w/o difficulty. Affect pleasant   Telemetry   Sinus tach, low 100s-110s.   Labs    CBC No results for input(s): WBC, NEUTROABS, HGB, HCT, MCV, PLT in the last 72 hours. Basic Metabolic Panel Recent Labs    10/12/19 0459 10/13/19 0431  NA 131* 132*  K 5.4* 5.7*  CL 99 102  CO2 23 22  GLUCOSE 109* 99  BUN 26* 32*  CREATININE 1.31* 1.20  CALCIUM 9.0 8.8*  MG 2.3  --    Liver Function Tests No results for input(s): AST, ALT, ALKPHOS, BILITOT, PROT, ALBUMIN in the last 72 hours. No results for input(s): LIPASE, AMYLASE in the last 72 hours. Cardiac Enzymes No results for input(s): CKTOTAL, CKMB, CKMBINDEX, TROPONINI in the last 72 hours.  BNP: BNP (last 3 results) Recent Labs    10/04/19 1041  BNP 2,614.9*    ProBNP (last 3 results) No results for input(s): PROBNP in the last 8760 hours.   D-Dimer No results for input(s): DDIMER in the last 72 hours. Hemoglobin A1C No results for input(s): HGBA1C in the last  72 hours. Fasting Lipid Panel No results for input(s): CHOL, HDL, LDLCALC, TRIG, CHOLHDL, LDLDIRECT in the last 72 hours. Thyroid Function Tests No results for input(s): TSH, T4TOTAL, T3FREE, THYROIDAB in the last 72 hours.  Invalid input(s): FREET3  Other results:   Imaging    ECHOCARDIOGRAM LIMITED  Result Date: 10/12/2019    ECHOCARDIOGRAM LIMITED REPORT   Patient Name:   Robert [name] Date of Exam: 10/12/2019 Medical Rec #:  [id number]    Height:       70.0 in Accession #:    [id number]   Weight:       178.1 lb Date of Birth:  11/01/1980     BSA:          1.987 m  Patient Age:    66 years     BP:           93/73 mmHg Patient Gender: M            HR:           109 bpm. Exam Location:  Inpatient Procedure: Limited Echo, Color Doppler and Cardiac Doppler Indications:    pericardial effusion  History:        Patient has prior history of Echocardiogram examinations, most                 recent 10/05/2019. CHF; Risk Factors:Current Smoker.  Sonographer:    Johny Chess Referring Phys: [place]  1. Left ventricular ejection fraction, by estimation, is <20%. The left ventricle has severely decreased function. The left ventricle demonstrates global hypokinesis. The left ventricular internal cavity size was moderately to severely dilated. Left ventricular diastolic function could not be evaluated.  2. Moderate pericardial effusion. There is no evidence of cardiac tamponade.  3. The mitral valve is normal in structure. Mild mitral valve regurgitation.  4. The aortic valve is tricuspid. Aortic valve regurgitation is not visualized. No aortic stenosis is present.  5. There is normal pulmonary artery systolic pressure.  6. The inferior vena cava is normal in size with <50% respiratory variability, suggesting right atrial pressure of 8 mmHg. Conclusion(s)/Recommendation(s): Pericardial effusion stable to slightly smaller. Rest of echo without significant change from prior. FINDINGS  Left Ventricle: Left ventricular ejection fraction, by estimation, is <20%. The left ventricle has severely decreased function. The left ventricle demonstrates global hypokinesis. The left ventricular internal cavity size was moderately to severely dilated. Right Ventricle: There is normal pulmonary artery systolic pressure. The tricuspid regurgitant velocity is 2.62 m/s, and with an assumed right atrial pressure of 8 mmHg, the estimated right ventricular systolic pressure is 10.6 mmHg. Pericardium: A moderately sized pericardial effusion is present. There is no evidence of cardiac  tamponade. Mitral Valve: The mitral valve is normal in structure. Mild mitral valve regurgitation. Tricuspid Valve: The tricuspid valve is normal in structure. Tricuspid valve regurgitation is mild. Aortic Valve: The aortic valve is tricuspid. Aortic valve regurgitation is not visualized. No aortic stenosis is present. Pulmonic Valve: The pulmonic valve was grossly normal. Pulmonic valve regurgitation is trivial. No evidence of pulmonic stenosis. Aorta: The aortic root and ascending aorta are structurally normal, with no evidence of dilitation. Venous: The inferior vena cava is normal in size with less than 50% respiratory variability, suggesting right atrial pressure of 8 mmHg.  LEFT VENTRICLE PLAX 2D LVIDd:         6.80 cm LVIDs:         6.32 cm LV PW:  0.92 cm LV IVS:        0.64 cm LVOT diam:     2.20 cm LVOT Area:     3.80 cm  IVC IVC diam: 1.89 cm LEFT ATRIUM         Index LA diam:    5.00 cm 2.52 cm/m  TRICUSPID VALVE TR Peak grad:   27.5 mmHg TR Vmax:        262.00 cm/s  SHUNTS Systemic Diam: 2.20 cm Buford Dresser MD Electronically signed by Buford Dresser MD Signature Date/Time: 10/12/2019/5:30:19 PM    Final      Medications:     Scheduled Medications: . Chlorhexidine Gluconate Cloth  6 each Topical Daily  . digoxin  0.125 mg Oral Daily  . folic acid  1 mg Oral Daily  . ivabradine  5 mg Oral BID WC  . losartan  12.5 mg Oral BID  . multivitamin with minerals  1 tablet Oral Daily  . rivaroxaban  15 mg Oral BID   Followed by  . [START ON 10/28/2019] rivaroxaban  20 mg Oral Daily  . sodium chloride flush  10-40 mL Intracatheter Q12H  . thiamine  100 mg Oral Daily    Infusions:   PRN Medications: acetaminophen, diazepam, iohexol, ondansetron (ZOFRAN) IV, ondansetron **OR** [DISCONTINUED] ondansetron (ZOFRAN) IV, sodium chloride flush     Assessment/Plan   1. Acute Systolic Heart Failure, NICM  - ECHO EF 15% RV moderately reduced. Suspect ETOH and cocaine  playing a role.  - LHC with no coronary disease. 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 (will need outpatient genetic testing) - Milrinone started on 5/28. CO-OX improved from 54>>67%.  Milrinone discontinued 6/1. Co-ox back down to 53%.  - CVP trending back up. Start Lasix PO 20 mg daily  - Continue digoxin 0.125 mg daily. Dig level oK (0.3 on 6/2) - Stop Arlyce Harman given persistent hyperkalemia   - Continue losartan 12.5 bid, no BP room to increase or transition to [employer].  - no  blocker w/ low output  - Continue Corlanor. Robert need further titration to 7.5 bid - Has had NSVT, now less frequent. - He has agreed to DAPA trial  - unfortunately, not a candidate for home milrinone or other advanced therapies currently given h/o substance abuse.  - Abstinence from ETOH and cocaine imperative   2. Pulmonary Emboli - CTA w/ small LLL PE - LE Venous dopplers negative for DVT  - Now on xarelto - TOC consulted for cost -->125.00.   3. ETOH Abuse - Heavy drinker for many years.  Needs to stop completely.  - On CIWA  -  Mg stable at 2.3 yesterday  - Refer to [facility] for outpatient follow up.   4. H/O Asthma  - Uses rescue inhaler but hasn't used his inhaler recently.     5. H/O Cocaine Abuse - Last used 2-3 weeks ago  6. Hypomagnesia -Had mag replaced. Stable at 2.3 yesterday   7. Pericardial Effusion - moderate circumferential effusion on cMRI. No definite signs of tamponade on study  - f/u limited ECHO 6/1 showed no interval change. Pericardial effusion remains moderate in size. No tamponade physiology.   8. Hyperkalemia  - K elevated, 5.7.  Received dose of lokelma. Repeat BMP - Stop spironolactone.   TOC Xarelto 125.65 co pay. ? Robert need to switch to eliquis if coverage better. TOC has been consulted corlanor covered 100%.  Length of Stay: [address] PA-C  10/13/2019, 12:16 PM  Advanced  Heart Failure Team Pager [phone] (M-F; 7a - 4p)  Please contact [facility] for night-coverage after hours (4p -7a ) and weekends on [email or web address]  Patient seen with PA, agree with the above note.    Repeat BMET after Lokelma with K 4.7.  He feels good, no dyspnea.  Co-ox is a bit lower today at 53%.  CVP 9-10.   General: NAD Neck: No JVD, no thyromegaly or thyroid nodule.  Lungs: Clear to auscultation bilaterally with normal respiratory effort. CV: Lateral PMI.  Heart regular S1/S2, no S3/S4, no murmur.  No peripheral edema.  Abdomen: Soft, nontender, no hepatosplenomegaly, no distention.  Skin: Intact without lesions or rashes.  Neurologic: Alert and oriented x 3.  Psych: Normal affect. Extremities: No clubbing or cyanosis.  HEENT: Normal.   I think that he can go home today.  He has stopped spironolactone and started Lasix 20 mg daily today.  He will need a BMET on Friday and close followup in 7-10 days in CHF clinic. He will continue digoxin, losartan, and ivabradine.   He will continue Xarelto for PE.   Needs to stay off ETOH/cocaine.   Loralie Champagne 10/13/2019 1:25 PM

## 2019-10-13 NOTE — Progress Notes (Addendum)
Discussed CRPII with pt. He is interested and I will refer to [place]. Also discussed smoking cessation but pt only somewhat interested. Left materials. Pt is interested in participating in Virtual Cardiac and Pulmonary Rehab. Pt advised that Virtual Cardiac and Pulmonary Rehab is provided at no cost to the patient.  Checklist:  1. Pt has smart device  ie smartphone and/or ipad for downloading an app  Yes 2. Reliable internet/wifi service    Yes 3. Understands how to use their smartphone and navigate within an app.  Yes Pt verbalized understanding and is in agreement.  [place], ACSM 2:48 PM 10/13/2019

## 2019-10-13 NOTE — Progress Notes (Addendum)
Patient's AM K level is 5.7. Paged and notify [place], [place]. Ordered a stat one time 10mg  Lokelma packet.

## 2019-10-13 NOTE — Research (Signed)
Subject Name: Robert [name]  Subject met inclusion and exclusion criteria.  The informed consent form, study requirements and expectations were reviewed with the subject and questions and concerns were addressed prior to the signing of the consent form.  The subject verbalized understanding of the trial requirements.  The subject agreed to participate in the DAPA trial and signed the informed consent at 10:11 on October 13, 2019. The informed consent was obtained prior to performance of any protocol-specific procedures for the subject.  A copy of the signed informed consent was given to the subject and a copy was placed in the subject's medical record.   [name]

## 2019-10-14 ENCOUNTER — Telehealth (HOSPITAL_COMMUNITY): Payer: Self-pay

## 2019-10-14 NOTE — Telephone Encounter (Signed)
Pt insurance is active and benefits verified through [employer] Co-pay 0, DED $2,750/$2,750 met, out of pocket $6,850/$3,307.07 met, co-insurance 25%. no pre-authorization required. Passport, 10/14/2019@1:36pm, REF# [id number]  Will contact patient to see if he is interested in the Cardiac Rehab Program. If interested, patient will need to complete follow up appt. Once completed, patient will be contacted for scheduling upon review by the RN Navigator. 

## 2019-10-15 ENCOUNTER — Other Ambulatory Visit: Payer: Self-pay

## 2019-10-15 ENCOUNTER — Ambulatory Visit (HOSPITAL_COMMUNITY)
Admission: RE | Admit: 2019-10-15 | Discharge: 2019-10-15 | Disposition: A | Discharge status: Home / Self Care | Payer: BC Managed Care – PPO | Source: Ambulatory Visit | Attending: Cardiology | Admitting: Cardiology

## 2019-10-15 DIAGNOSIS — I5021 Acute systolic (congestive) heart failure: Secondary | ICD-10-CM | POA: Diagnosis present

## 2019-10-15 LAB — BASIC METABOLIC PANEL
Anion gap: 11 (ref 5–15)
BUN: 30 mg/dL — ABNORMAL HIGH (ref 6–20)
CO2: 22 mmol/L (ref 22–32)
Calcium: 9.1 mg/dL (ref 8.9–10.3)
Chloride: 101 mmol/L (ref 98–111)
Creatinine, Ser: 1.37 mg/dL — ABNORMAL HIGH (ref 0.61–1.24)
GFR calc Af Amer: >60 mL/min (ref >60)
GFR calc non Af Amer: >60 mL/min (ref >60)
Glucose, Bld: 119 mg/dL — ABNORMAL HIGH (ref 70–99)
Potassium: 4.9 mmol/L (ref 3.5–5.1)
Sodium: 134 mmol/L — ABNORMAL LOW (ref 135–145)

## 2019-10-18 ENCOUNTER — Telehealth (HOSPITAL_COMMUNITY): Payer: Self-pay | Admitting: *Deleted

## 2019-10-18 ENCOUNTER — Encounter (HOSPITAL_COMMUNITY): Payer: Self-pay | Admitting: *Deleted

## 2019-10-18 NOTE — Telephone Encounter (Signed)
Pt filled out walk in form stating he's gained 10-11lbs since hospital discharge 6/3 and his left ankle is really swollen. Per Brittainy Simmons,PA increase lasix to 40mg  daily for 2 days and call our office if his weight doesn't go down. Pt aware and voiced understanding.

## 2019-10-24 ENCOUNTER — Encounter (HOSPITAL_COMMUNITY): Payer: Self-pay | Admitting: Emergency Medicine

## 2019-10-24 ENCOUNTER — Emergency Department (HOSPITAL_COMMUNITY): Payer: BC Managed Care – PPO

## 2019-10-24 ENCOUNTER — Emergency Department (HOSPITAL_COMMUNITY)
Admission: EM | Admit: 2019-10-24 | Discharge: 2019-10-25 | Disposition: A | Discharge status: Home / Self Care | Payer: BC Managed Care – PPO | Attending: Emergency Medicine | Admitting: Emergency Medicine

## 2019-10-24 ENCOUNTER — Other Ambulatory Visit: Payer: Self-pay

## 2019-10-24 DIAGNOSIS — R0602 Shortness of breath: Secondary | ICD-10-CM | POA: Diagnosis not present

## 2019-10-24 DIAGNOSIS — R0789 Other chest pain: Secondary | ICD-10-CM | POA: Diagnosis present

## 2019-10-24 DIAGNOSIS — Z5321 Procedure and treatment not carried out due to patient leaving prior to being seen by health care provider: Secondary | ICD-10-CM | POA: Insufficient documentation

## 2019-10-24 LAB — CBC
HCT: 35.7 % — ABNORMAL LOW (ref 39.0–52.0)
Hemoglobin: 11.5 g/dL — ABNORMAL LOW (ref 13.0–17.0)
MCH: 29.2 pg (ref 26.0–34.0)
MCHC: 32.2 g/dL (ref 30.0–36.0)
MCV: 90.6 fL (ref 80.0–100.0)
Platelets: 280 10*3/uL (ref 150–400)
RBC: 3.94 MIL/uL — ABNORMAL LOW (ref 4.22–5.81)
RDW: 13.5 % (ref 11.5–15.5)
WBC: 5.1 10*3/uL (ref 4.0–10.5)
nRBC: 0 % (ref 0.0–0.2)

## 2019-10-24 LAB — BASIC METABOLIC PANEL
Anion gap: 11 (ref 5–15)
BUN: 23 mg/dL — ABNORMAL HIGH (ref 6–20)
CO2: 22 mmol/L (ref 22–32)
Calcium: 8.8 mg/dL — ABNORMAL LOW (ref 8.9–10.3)
Chloride: 107 mmol/L (ref 98–111)
Creatinine, Ser: 1.06 mg/dL (ref 0.61–1.24)
GFR calc Af Amer: >60 mL/min (ref >60)
GFR calc non Af Amer: >60 mL/min (ref >60)
Glucose, Bld: 91 mg/dL (ref 70–99)
Potassium: 3.9 mmol/L (ref 3.5–5.1)
Sodium: 140 mmol/L (ref 135–145)

## 2019-10-24 LAB — PROTIME-INR
INR: 2 — ABNORMAL HIGH (ref 0.8–1.2)
Prothrombin Time: 21.6 seconds — ABNORMAL HIGH (ref 11.4–15.2)

## 2019-10-24 LAB — TROPONIN I (HIGH SENSITIVITY): Troponin I (High Sensitivity): 14 ng/L (ref <18)

## 2019-10-24 MED ORDER — SODIUM CHLORIDE 0.9% FLUSH
3.0000 mL | Freq: Once | INTRAVENOUS | Status: DC
  Administered 2019-10-25 (×2): 3 mL via INTRAVENOUS

## 2019-10-24 NOTE — ED Triage Notes (Signed)
Patient reports right chest pain onset yesterday with SOB , denies nausea or diaphoresis , history of CHF , no fever or cough.

## 2019-10-25 ENCOUNTER — Ambulatory Visit (HOSPITAL_BASED_OUTPATIENT_CLINIC_OR_DEPARTMENT_OTHER)
Admit: 2019-10-25 | Discharge: 2019-10-25 | Disposition: A | Discharge status: Home / Self Care | Payer: BC Managed Care – PPO | Attending: Adult Health | Admitting: Adult Health

## 2019-10-25 ENCOUNTER — Encounter (HOSPITAL_COMMUNITY): Payer: Self-pay

## 2019-10-25 ENCOUNTER — Telehealth (HOSPITAL_COMMUNITY): Payer: Self-pay | Admitting: Pharmacy Technician

## 2019-10-25 VITALS — BP 110/82 | HR 87 | Wt 194.6 lb

## 2019-10-25 DIAGNOSIS — F1010 Alcohol abuse, uncomplicated: Secondary | ICD-10-CM | POA: Diagnosis not present

## 2019-10-25 DIAGNOSIS — Z720 Tobacco use: Secondary | ICD-10-CM

## 2019-10-25 DIAGNOSIS — I5043 Acute on chronic combined systolic (congestive) and diastolic (congestive) heart failure: Secondary | ICD-10-CM | POA: Diagnosis not present

## 2019-10-25 DIAGNOSIS — I5022 Chronic systolic (congestive) heart failure: Secondary | ICD-10-CM | POA: Diagnosis not present

## 2019-10-25 DIAGNOSIS — Z5321 Procedure and treatment not carried out due to patient leaving prior to being seen by health care provider: Secondary | ICD-10-CM | POA: Diagnosis not present

## 2019-10-25 DIAGNOSIS — R0789 Other chest pain: Secondary | ICD-10-CM | POA: Diagnosis not present

## 2019-10-25 DIAGNOSIS — R0602 Shortness of breath: Secondary | ICD-10-CM | POA: Diagnosis not present

## 2019-10-25 DIAGNOSIS — I2782 Chronic pulmonary embolism: Secondary | ICD-10-CM

## 2019-10-25 LAB — URINE DRUGS OF ABUSE SCREEN W ALC, ROUTINE (REF LAB)
Amphetamines, Urine: NEGATIVE ng/mL
Barbiturate, Ur: NEGATIVE ng/mL
Benzodiazepine Quant, Ur: NEGATIVE ng/mL
Methadone Screen, Urine: NEGATIVE ng/mL
Opiate Quant, Ur: NEGATIVE ng/mL
Phencyclidine, Ur: NEGATIVE ng/mL
Propoxyphene, Urine: NEGATIVE ng/mL

## 2019-10-25 LAB — BASIC METABOLIC PANEL
Anion gap: 12 (ref 5–15)
BUN: 20 mg/dL (ref 6–20)
CO2: 20 mmol/L — ABNORMAL LOW (ref 22–32)
Calcium: 8.7 mg/dL — ABNORMAL LOW (ref 8.9–10.3)
Chloride: 104 mmol/L (ref 98–111)
Creatinine, Ser: 0.99 mg/dL (ref 0.61–1.24)
GFR calc Af Amer: >60 mL/min (ref >60)
GFR calc non Af Amer: >60 mL/min (ref >60)
Glucose, Bld: 103 mg/dL — ABNORMAL HIGH (ref 70–99)
Potassium: 3.6 mmol/L (ref 3.5–5.1)
Sodium: 136 mmol/L (ref 135–145)

## 2019-10-25 LAB — PANEL 799049: Cannabinoid GC/MS, Ur: NEGATIVE

## 2019-10-25 LAB — MAGNESIUM: Magnesium: 1.6 mg/dL — ABNORMAL LOW (ref 1.7–2.4)

## 2019-10-25 LAB — CBC
HCT: 36.5 % — ABNORMAL LOW (ref 39.0–52.0)
Hemoglobin: 11.6 g/dL — ABNORMAL LOW (ref 13.0–17.0)
MCH: 29 pg (ref 26.0–34.0)
MCHC: 31.8 g/dL (ref 30.0–36.0)
MCV: 91.3 fL (ref 80.0–100.0)
Platelets: 275 10*3/uL (ref 150–400)
RBC: 4 MIL/uL — ABNORMAL LOW (ref 4.22–5.81)
RDW: 13.2 % (ref 11.5–15.5)
WBC: 7.4 10*3/uL (ref 4.0–10.5)
nRBC: 0 % (ref 0.0–0.2)

## 2019-10-25 LAB — COCAINE CONF, UR
Benzoylecgonine GC/MS Conf: 675 ng/mL
Cocaine Metab Quant, Ur: POSITIVE — AB

## 2019-10-25 LAB — ETHANOL CONFIRM, URINE: Ethanol, Ur - Confirmation: 0.057 %

## 2019-10-25 LAB — TROPONIN I (HIGH SENSITIVITY): Troponin I (High Sensitivity): 14 ng/L (ref <18)

## 2019-10-25 MED ORDER — FUROSEMIDE 10 MG/ML IJ SOLN
80.0000 mg | Freq: Once | INTRAMUSCULAR | Status: AC
  Administered 2019-10-25: 80 mg via INTRAVENOUS
  Filled 2019-10-25: qty 8

## 2019-10-25 MED ORDER — POTASSIUM CHLORIDE CRYS ER 20 MEQ PO TBCR
40.0000 meq | EXTENDED_RELEASE_TABLET | Freq: Once | ORAL | Status: AC
  Administered 2019-10-25: 40 meq via ORAL
  Filled 2019-10-25: qty 2

## 2019-10-25 MED ORDER — FUROSEMIDE 80 MG PO TABS: 80.0000 mg | ORAL_TABLET | Freq: Every day | ORAL | 6 refills | Status: DC

## 2019-10-25 NOTE — Patient Instructions (Addendum)
INCREASE Lasix to 80 mg daily   Labs today We will only contact you if something comes back abnormal or we need to make some changes. Otherwise no news is good news!   Your physician recommends that you schedule a follow-up appointment in: 1 week  in the Advanced Practitioners (PA/NP) Clinic   Do the following things EVERYDAY: 1) Weigh yourself in the morning before breakfast. Write it down and keep it in a log. 2) Take your medicines as prescribed 3) Eat low salt foods--Limit salt (sodium) to 2000 mg per day.  4) Stay as active as you can everyday 5) Limit all fluids for the day to less than 2 liters

## 2019-10-25 NOTE — Telephone Encounter (Signed)
Spoke with patient regarding assistance with [place]. Insurance says coverage was terminated 10/22/19. Advised patient to contact insurance since he was unaware of the change.  Was able to start an [employer] application in an effort to get his medication at no cost.  Will follow up.

## 2019-10-25 NOTE — ED Notes (Signed)
Pt called for room x2, no answer ?

## 2019-10-25 NOTE — Progress Notes (Signed)
PCP: Primary Cardiologist: Dr Aundra Dubin   HPI: 39 y/o male with h/o heavy ETOH and recent cocaine use, PE, recently diagnosed biventricular heart failure.   Admitted with acute onset HF with severe biventricular failure. EF 15%. RV down. He was drinking at least 2 beers every night and about 12-24 over the weekend. Cath shown normal coronaries. Elevated filling pressures with normal output. Placed on milrinone and diuresed with IV lasix. As he improved milrinone was weaned off . Transitioned to lasix 20 mg daily. cMRI shows Noncompaction. Significant daily ETOH intake. CT shows small LLL PE. Placed on xarelto. Started on HF meds.  Discharge weight 178 pounds.   Today he returns for post hospital follow up. Overall feeling fine. Having slight shortness of breath after he walks in the grocery store. Says it goes away after he slows down. Denies PND/Orthopnea. Increased leg edema over the last few days.  Drinking lots of fluids.  Appetite ok. No fever or chills. Weight at home trending up 1-2 pounds a day. Weight at home up to 194 today. Smoking 3-4 cigarettes per day. Drinking 3 small bottles of beer a day. Has not used cocaine. Taking all medications. He is not working. Lives with his Mom.   ECHO 10/12/2019 Pericardial effusion remains moderate in size. No tamponade physiology. ECHO 10/05/2019 EF <20%  Prominent trabeculation. Grade III DD  [facility] 10/05/2019 showed normal coronaries, elevated filling pressures with normal output.  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  SH:  Social History   Socioeconomic History  . Marital status: Single    Spouse name: Not on file  . Number of children: Not on file  . Years of education: Not on file  . Highest education level: Not on file  Occupational History  . Not on file  Tobacco Use  . Smoking status: Current Every Day Smoker     Packs/day: 0.50    Types: Cigarettes  . Smokeless tobacco: Never Used  Vaping Use  . Vaping Use: Former  Substance and Sexual Activity  . Alcohol use: Yes  . Drug use: Yes    Types: Marijuana  . Sexual activity: Not on file  Other Topics Concern  . Not on file  Social History Narrative  . Not on file   Social Determinants of Health   Financial Resource Strain:   . Difficulty of Paying Living Expenses:   Food Insecurity:   . Worried About Charity fundraiser in the Last Year:   . Arboriculturist in the Last Year:   Transportation Needs:   . Film/video editor (Medical):   [name] Lack of Transportation (Non-Medical):   Physical Activity:   . Days of Exercise per Week:   . Minutes of Exercise per Session:   Stress:   . Feeling of Stress :   Social Connections:   . Frequency of Communication with Friends and Family:   . Frequency of Social Gatherings with Friends and Family:   . Attends Religious Services:   . Active Member of Clubs or Organizations:   . Attends Archivist Meetings:   [name] Marital Status:   Intimate Partner Violence:   . Fear of Current or Ex-Partner:   . Emotionally Abused:   [name] Physically Abused:   . Sexually Abused:     FH:  Family History  Problem Relation Age of Onset  .  Heart failure Father   . Diabetes Father   . Kidney disease Father     Past Medical History:  Diagnosis Date  . Asthma     Current Outpatient Medications  Medication Sig Dispense Refill  . digoxin (LANOXIN) 0.125 MG tablet Take 1 tablet (0.125 mg total) by mouth daily. 30 tablet 5  . folic acid (FOLVITE) 1 MG tablet Take 1 tablet (1 mg total) by mouth daily. 30 tablet 3  . furosemide (LASIX) 20 MG tablet Take 1 tablet (20 mg total) by mouth daily. 30 tablet 5  . ivabradine (CORLANOR) 5 MG TABS tablet Take 1 tablet (5 mg total) by mouth 2 (two) times daily with a meal. 60 tablet 5  . losartan (COZAAR) 25 MG tablet Take 0.5 tablets (12.5 mg total) by mouth 2 (two)  times daily. 30 tablet 5  . [START ON 10/28/2019] rivaroxaban (XARELTO) 20 MG TABS tablet Take 1 tablet (20 mg total) by mouth daily. 30 tablet 5  . RIVAROXABAN (XARELTO) VTE STARTER PACK (15 & 20 MG TABLETS) Follow package directions: Take one 15mg  tablet by mouth twice a day. On day 22, switch to one 20mg  tablet once a day. Take with food. 51 each 0   No current facility-administered medications for this encounter.    Vitals:   10/25/19 1149  BP: 110/82  Pulse: 87  SpO2: 99%  Weight: 88.3 kg (194 lb 9.6 oz)    ReDS Vest / Clip - 10/25/19 1200      ReDS Vest / Clip   Station Marker D    Ruler Value 34    ReDS Value Range High volume overload    ReDS Actual Value 44    Anatomical Comments sitting           PHYSICAL EXAM: General:  Well appearing. No resp difficulty HEENT: normal Neck: supple. JVP 11-12 . Carotids 2+ bilaterally; no bruits. No lymphadenopathy or thryomegaly appreciated. Cor: PMI normal. Regular rate & rhythm. No rubs, gallops or murmurs. Lungs: clear Abdomen: soft, nontender, nondistended. No hepatosplenomegaly. No bruits or masses. Good bowel sounds. Extremities: no cyanosis, clubbing, rash,  R and LLE 2+ edema Neuro: alert & orientedx3, cranial nerves grossly intact. Moves all 4 extremities w/o difficulty. Affect pleasant.   ECG: SR 89 bpm personally reviewed   ASSESSMENT & PLAN: 1.Chronic Systolic Heart Failure, NICM  - ECHO EF 15% RV moderately reduced. Suspect ETOH and cocaine playing a role.  - LHC with no coronary disease. 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 (will need outpatient genetic testing) - NYHA III. Volume status elevated. Reds Clip 44%. Suspect elevated in the setting of high sodium diet and increased fluid intake.  - Give 80 mg IV lasix + 40 meq potassium in the clinic now.-->  Had > 500 cc urine output.  Tomorrow he will start lasix 80 mg po daily.  -  Continue digoxin 0.125 mg daily  - Continue spiro 12.5 daily.   - Continue losartan 12.5 bid - Corlanor 5 mg bid. Completed patient assistance form for corlanor.  - consider SGLT2i. - Discussed low salt food choices and limiting fluid intake to < 2 liters per day.  - Plan to repeat ECHO in 3 months.   2.Pulmonary Emboli - CTA w/ small LLL PE - LE Venous dopplers negative for DVT  - Continue xarelto . No bleeding issues  - Check CBC   3. ETOH Abuse - Heavy  drinker for many years.   - He has started drinking again after discharge.  - Next visit will ask HFSW to follow up with him.    4. H/O Asthma  - Uses rescue inhaler but hasnt his inhaler recently.   5. H/O Cocaine Abuse - No use since discharge.   6. Hypomagnesia - Check Mag   7. Pericardial Effusion - moderate circumferential effusion on cMRI. No definite signs of tamponade on study.  - Repeat ECHO in the 3 months   8. Hyperkalemia  Check BMET  9. Tobacco Abuse Discussed smoking cessation   Follow up next week to reassess volume status. Check labs today . Provided with HF education on  Diet and limiting fluid intake.   Kawehi Hostetter NP-C  1:38 PM

## 2019-10-25 NOTE — Progress Notes (Signed)
ReDS Vest / Clip - 10/25/19 1200      ReDS Vest / Clip   Station Marker D    Ruler Value 34    ReDS Value Range High volume overload    ReDS Actual Value 44    Anatomical Comments sitting            Medication Samples have been provided to the patient.  Drug name: corlanor       Strength: 5mg          Qty: 28  LOT: [id number]  Exp.Date: 11/2022  Dosing instructions: one tab twice daily  The patient has been instructed regarding the correct time, dose, and frequency of taking this medication, including desired effects and most common side effects.   Robert [name] 12:30 PM 10/25/2019

## 2019-10-27 NOTE — Telephone Encounter (Signed)
[employer] application sent in via fax.  Will follow up.

## 2019-10-28 ENCOUNTER — Telehealth (HOSPITAL_COMMUNITY): Payer: Self-pay | Admitting: Cardiology

## 2019-10-28 ENCOUNTER — Encounter (HOSPITAL_COMMUNITY): Payer: Self-pay

## 2019-10-28 MED ORDER — MAGNESIUM OXIDE -MG SUPPLEMENT 200 MG PO TABS
200.0000 mg | ORAL_TABLET | Freq: Two times a day (BID) | ORAL | 11 refills | Status: DC
Start: 2019-10-28 — End: 2019-12-09

## 2019-10-28 NOTE — Telephone Encounter (Signed)
Attempted to call patient in regards to Cardiac Rehab - LM on VM Mailed letter 

## 2019-10-28 NOTE — Telephone Encounter (Signed)
-----   Message from Conrad Wellston, NP sent at 10/25/2019  1:44 PM EDT ----- Renal function stable. CBC stable.  Mag low. Start mag ox 200 mg twice a day.

## 2019-10-28 NOTE — Telephone Encounter (Signed)
Pt aware.

## 2019-10-29 NOTE — Telephone Encounter (Signed)
Advanced Heart Failure Patient Advocate Encounter   Patient was approved to receive Corlanor from [employer]   Effective dates: 10/28/19 through 10/27/20  Called and left patient message with phone number to the foundation.  Charlann Boxer, CPhT

## 2019-10-30 ENCOUNTER — Emergency Department (HOSPITAL_COMMUNITY)
Admission: EM | Admit: 2019-10-30 | Discharge: 2019-10-30 | Discharge status: Against Medical Advice | Payer: BC Managed Care – PPO

## 2019-10-30 ENCOUNTER — Inpatient Hospital Stay (HOSPITAL_COMMUNITY)
Admit: 2019-10-30 | Discharge: 2019-11-02 | DRG: 378 | Disposition: A | Discharge status: Home / Self Care | Payer: BC Managed Care – PPO | Attending: Internal Medicine | Admitting: Internal Medicine

## 2019-10-30 ENCOUNTER — Emergency Department (HOSPITAL_COMMUNITY): Payer: BC Managed Care – PPO

## 2019-10-30 ENCOUNTER — Emergency Department (HOSPITAL_COMMUNITY)
Admission: EM | Admit: 2019-10-30 | Discharge: 2019-10-30 | Disposition: A | Discharge status: Home / Self Care | Payer: BC Managed Care – PPO | Source: Home / Self Care

## 2019-10-30 ENCOUNTER — Other Ambulatory Visit: Payer: Self-pay

## 2019-10-30 ENCOUNTER — Encounter (HOSPITAL_COMMUNITY): Payer: Self-pay | Admitting: *Deleted

## 2019-10-30 DIAGNOSIS — R1110 Vomiting, unspecified: Secondary | ICD-10-CM

## 2019-10-30 DIAGNOSIS — F17210 Nicotine dependence, cigarettes, uncomplicated: Secondary | ICD-10-CM | POA: Diagnosis present

## 2019-10-30 DIAGNOSIS — E860 Dehydration: Secondary | ICD-10-CM | POA: Diagnosis present

## 2019-10-30 DIAGNOSIS — I2699 Other pulmonary embolism without acute cor pulmonale: Secondary | ICD-10-CM | POA: Diagnosis present

## 2019-10-30 DIAGNOSIS — R079 Chest pain, unspecified: Secondary | ICD-10-CM | POA: Insufficient documentation

## 2019-10-30 DIAGNOSIS — Z881 Allergy status to other antibiotic agents status: Secondary | ICD-10-CM

## 2019-10-30 DIAGNOSIS — Z20822 Contact with and (suspected) exposure to covid-19: Secondary | ICD-10-CM | POA: Diagnosis present

## 2019-10-30 DIAGNOSIS — D62 Acute posthemorrhagic anemia: Secondary | ICD-10-CM | POA: Diagnosis present

## 2019-10-30 DIAGNOSIS — R072 Precordial pain: Secondary | ICD-10-CM | POA: Diagnosis not present

## 2019-10-30 DIAGNOSIS — F10229 Alcohol dependence with intoxication, unspecified: Secondary | ICD-10-CM | POA: Diagnosis present

## 2019-10-30 DIAGNOSIS — K2921 Alcoholic gastritis with bleeding: Principal | ICD-10-CM | POA: Diagnosis present

## 2019-10-30 DIAGNOSIS — E875 Hyperkalemia: Secondary | ICD-10-CM | POA: Diagnosis present

## 2019-10-30 DIAGNOSIS — I428 Other cardiomyopathies: Secondary | ICD-10-CM | POA: Diagnosis present

## 2019-10-30 DIAGNOSIS — F1410 Cocaine abuse, uncomplicated: Secondary | ICD-10-CM | POA: Diagnosis present

## 2019-10-30 DIAGNOSIS — N183 Chronic kidney disease, stage 3 unspecified: Secondary | ICD-10-CM | POA: Diagnosis present

## 2019-10-30 DIAGNOSIS — K3189 Other diseases of stomach and duodenum: Secondary | ICD-10-CM | POA: Diagnosis present

## 2019-10-30 DIAGNOSIS — I313 Pericardial effusion (noninflammatory): Secondary | ICD-10-CM | POA: Diagnosis present

## 2019-10-30 DIAGNOSIS — N179 Acute kidney failure, unspecified: Secondary | ICD-10-CM

## 2019-10-30 DIAGNOSIS — K2970 Gastritis, unspecified, without bleeding: Secondary | ICD-10-CM

## 2019-10-30 DIAGNOSIS — Z79899 Other long term (current) drug therapy: Secondary | ICD-10-CM

## 2019-10-30 DIAGNOSIS — Y908 Blood alcohol level of 240 mg/100 ml or more: Secondary | ICD-10-CM | POA: Diagnosis present

## 2019-10-30 DIAGNOSIS — I5022 Chronic systolic (congestive) heart failure: Secondary | ICD-10-CM | POA: Diagnosis present

## 2019-10-30 DIAGNOSIS — Z5321 Procedure and treatment not carried out due to patient leaving prior to being seen by health care provider: Secondary | ICD-10-CM | POA: Insufficient documentation

## 2019-10-30 DIAGNOSIS — K219 Gastro-esophageal reflux disease without esophagitis: Secondary | ICD-10-CM | POA: Diagnosis present

## 2019-10-30 DIAGNOSIS — F10231 Alcohol dependence with withdrawal delirium: Secondary | ICD-10-CM | POA: Diagnosis present

## 2019-10-30 DIAGNOSIS — I130 Hypertensive heart and chronic kidney disease with heart failure and stage 1 through stage 4 chronic kidney disease, or unspecified chronic kidney disease: Secondary | ICD-10-CM | POA: Diagnosis present

## 2019-10-30 DIAGNOSIS — I5082 Biventricular heart failure: Secondary | ICD-10-CM | POA: Diagnosis present

## 2019-10-30 DIAGNOSIS — I340 Nonrheumatic mitral (valve) insufficiency: Secondary | ICD-10-CM | POA: Diagnosis present

## 2019-10-30 DIAGNOSIS — Z7901 Long term (current) use of anticoagulants: Secondary | ICD-10-CM

## 2019-10-30 DIAGNOSIS — R197 Diarrhea, unspecified: Secondary | ICD-10-CM

## 2019-10-30 DIAGNOSIS — R791 Abnormal coagulation profile: Secondary | ICD-10-CM

## 2019-10-30 DIAGNOSIS — Z86711 Personal history of pulmonary embolism: Secondary | ICD-10-CM

## 2019-10-30 HISTORY — DX: Alcohol use, unspecified, uncomplicated: F10.90

## 2019-10-30 HISTORY — DX: Heart failure, unspecified: I50.9

## 2019-10-30 HISTORY — DX: Other problems related to lifestyle: Z72.89

## 2019-10-30 HISTORY — DX: Acute kidney failure, unspecified: N17.9

## 2019-10-30 HISTORY — DX: Other specified health status: Z78.9

## 2019-10-30 LAB — ETHANOL: Alcohol, Ethyl (B): 341 mg/dL (ref <10)

## 2019-10-30 LAB — PROTIME-INR
INR: 3.8 — ABNORMAL HIGH (ref 0.8–1.2)
Prothrombin Time: 35.9 seconds — ABNORMAL HIGH (ref 11.4–15.2)

## 2019-10-30 LAB — TYPE AND SCREEN
ABO/RH(D): O POS
Antibody Screen: NEGATIVE

## 2019-10-30 LAB — CBC
HCT: 41.3 % (ref 39.0–52.0)
Hemoglobin: 13.9 g/dL (ref 13.0–17.0)
MCH: 29.2 pg (ref 26.0–34.0)
MCHC: 33.7 g/dL (ref 30.0–36.0)
MCV: 86.8 fL (ref 80.0–100.0)
Platelets: 274 10*3/uL (ref 150–400)
RBC: 4.76 MIL/uL (ref 4.22–5.81)
RDW: 13.3 % (ref 11.5–15.5)
WBC: 4.5 10*3/uL (ref 4.0–10.5)
nRBC: 0 % (ref 0.0–0.2)

## 2019-10-30 LAB — SARS CORONAVIRUS 2 BY RT PCR (HOSPITAL ORDER, PERFORMED IN ~~LOC~~ HOSPITAL LAB): SARS Coronavirus 2: NEGATIVE

## 2019-10-30 LAB — BASIC METABOLIC PANEL
Anion gap: 18 — ABNORMAL HIGH (ref 5–15)
BUN: 28 mg/dL — ABNORMAL HIGH (ref 6–20)
CO2: 22 mmol/L (ref 22–32)
Calcium: 8.7 mg/dL — ABNORMAL LOW (ref 8.9–10.3)
Chloride: 96 mmol/L — ABNORMAL LOW (ref 98–111)
Creatinine, Ser: 1.56 mg/dL — ABNORMAL HIGH (ref 0.61–1.24)
GFR calc Af Amer: >60 mL/min (ref >60)
GFR calc non Af Amer: 56 mL/min — ABNORMAL LOW (ref >60)
Glucose, Bld: 109 mg/dL — ABNORMAL HIGH (ref 70–99)
Potassium: 3.8 mmol/L (ref 3.5–5.1)
Sodium: 136 mmol/L (ref 135–145)

## 2019-10-30 LAB — HEPATIC FUNCTION PANEL
ALT: 61 U/L — ABNORMAL HIGH (ref 0–44)
AST: 56 U/L — ABNORMAL HIGH (ref 15–41)
Albumin: 4.3 g/dL (ref 3.5–5.0)
Alkaline Phosphatase: 49 U/L (ref 38–126)
Bilirubin, Direct: 0.2 mg/dL (ref 0.0–0.2)
Indirect Bilirubin: 0.9 mg/dL (ref 0.3–0.9)
Total Bilirubin: 1.1 mg/dL (ref 0.3–1.2)
Total Protein: 7.6 g/dL (ref 6.5–8.1)

## 2019-10-30 LAB — POC OCCULT BLOOD, ED: Fecal Occult Bld: POSITIVE — AB

## 2019-10-30 LAB — LIPASE, BLOOD: Lipase: 29 U/L (ref 11–51)

## 2019-10-30 LAB — TROPONIN I (HIGH SENSITIVITY)
Troponin I (High Sensitivity): 32 ng/L — ABNORMAL HIGH (ref <18)
Troponin I (High Sensitivity): 31 ng/L — ABNORMAL HIGH (ref <18)

## 2019-10-30 LAB — DIGOXIN LEVEL: Digoxin Level: 0.6 ng/mL — ABNORMAL LOW (ref 0.8–2.0)

## 2019-10-30 MED ORDER — ONDANSETRON HCL 4 MG PO TABS: 4.0000 mg | ORAL_TABLET | Freq: Four times a day (QID) | ORAL | Status: DC | PRN

## 2019-10-30 MED ORDER — ONDANSETRON HCL 4 MG/2ML IJ SOLN
4.0000 mg | Freq: Four times a day (QID) | INTRAMUSCULAR | Status: DC | PRN
  Administered 2019-10-31: 4 mg via INTRAVENOUS
  Filled 2019-10-30 (×2): qty 2

## 2019-10-30 MED ORDER — SODIUM CHLORIDE 0.9% FLUSH: 3.0000 mL | Freq: Once | INTRAVENOUS | Status: DC

## 2019-10-30 MED ORDER — SODIUM CHLORIDE 0.9 % IV SOLN: INTRAVENOUS | Status: DC

## 2019-10-30 MED ORDER — DIGOXIN 125 MCG PO TABS
0.1250 mg | ORAL_TABLET | Freq: Every day | ORAL | Status: DC
  Administered 2019-10-31 – 2019-11-02 (×3): 0.125 mg via ORAL
  Filled 2019-10-30 (×3): qty 1

## 2019-10-30 MED ORDER — SODIUM CHLORIDE 0.9 % IV SOLN
8.0000 mg/h | INTRAVENOUS | Status: DC
  Administered 2019-10-30: 8 mg/h via INTRAVENOUS
  Filled 2019-10-30: qty 80

## 2019-10-30 MED ORDER — LORAZEPAM 2 MG/ML IJ SOLN: 1.0000 mg | INTRAMUSCULAR | Status: DC | PRN

## 2019-10-30 MED ORDER — PANTOPRAZOLE SODIUM 40 MG IV SOLR: 40.0000 mg | Freq: Two times a day (BID) | INTRAVENOUS | Status: DC

## 2019-10-30 MED ORDER — LORAZEPAM 1 MG PO TABS
1.0000 mg | ORAL_TABLET | ORAL | Status: DC | PRN
  Administered 2019-10-31 (×2): 2 mg via ORAL
  Administered 2019-11-01: 4 mg via ORAL
  Filled 2019-10-30 (×2): qty 2
  Filled 2019-10-30: qty 4

## 2019-10-30 MED ORDER — ADULT MULTIVITAMIN W/MINERALS CH
1.0000 | ORAL_TABLET | Freq: Every day | ORAL | Status: DC
  Administered 2019-10-31 – 2019-11-02 (×3): 1 via ORAL
  Filled 2019-10-30 (×3): qty 1

## 2019-10-30 MED ORDER — THIAMINE HCL 100 MG PO TABS
100.0000 mg | ORAL_TABLET | Freq: Every day | ORAL | Status: DC
  Administered 2019-11-01 – 2019-11-02 (×2): 100 mg via ORAL
  Filled 2019-10-30 (×2): qty 1

## 2019-10-30 MED ORDER — FOLIC ACID 1 MG PO TABS
1.0000 mg | ORAL_TABLET | Freq: Every day | ORAL | Status: DC
  Administered 2019-10-31 – 2019-11-02 (×3): 1 mg via ORAL
  Filled 2019-10-30 (×3): qty 1

## 2019-10-30 MED ORDER — SODIUM CHLORIDE 0.9 % IV SOLN
80.0000 mg | Freq: Once | INTRAVENOUS | Status: AC
  Administered 2019-10-30: 80 mg via INTRAVENOUS
  Filled 2019-10-30: qty 80

## 2019-10-30 MED ORDER — SODIUM CHLORIDE 0.9% FLUSH
3.0000 mL | Freq: Two times a day (BID) | INTRAVENOUS | Status: DC
  Administered 2019-10-31 – 2019-11-02 (×2): 3 mL via INTRAVENOUS

## 2019-10-30 MED ORDER — THIAMINE HCL 100 MG/ML IJ SOLN
100.0000 mg | Freq: Every day | INTRAMUSCULAR | Status: DC
  Administered 2019-10-31: 100 mg via INTRAVENOUS
  Filled 2019-10-30: qty 2

## 2019-10-30 MED ORDER — IVABRADINE HCL 5 MG PO TABS
5.0000 mg | ORAL_TABLET | Freq: Two times a day (BID) | ORAL | Status: DC
  Administered 2019-10-31 – 2019-11-02 (×5): 5 mg via ORAL
  Filled 2019-10-30 (×5): qty 1

## 2019-10-30 NOTE — ED Provider Notes (Signed)
[facility] EMERGENCY DEPARTMENT Provider Note   CSN: [id number] Arrival date & time: 10/30/19  1720     History Chief Complaint  Patient presents with  . Chest Pain    Robert [name] is a 39 y.o. male.  The history is provided by the patient, medical records and a parent. No language interpreter was used.  Chest Pain  Robert [name] is a 39 y.o. male who presents to the Emergency Department complaining of multiple complaints. He presents the emergency department accompanied by his mother for multiple complaints. He complains of intermittent nausea, vomiting, diarrhea, chest pain, shortness of breath that is been ongoing for several days to weeks. He reports about one month of intermittent hematemesis. He describes the vomit as clear followed by blood he at times. He is also sometimes coughing up blood. His mother states that she thinks she saw black blood near his mouth today but he denies it. He also complains of black stools, 2 to 3 daily. He also has intermittent chest pain as well as shortness of breath. He has a history of CHF, PE on Xarelto. He did miss his dose of Xarelto yesterday but took it today at 3 PM. No history of prior G.I. bleed. He does drink alcohol and drink two airplane bottles today. Symptoms are severe, constant, worsening.    Past Medical History:  Diagnosis Date  . Asthma   . CHF (congestive heart failure) [facility])     Patient Active Problem List   Diagnosis Date Noted  . Acute heart failure ([place]) 10/05/2019  . Pulmonary embolism ([place]) 10/04/2019  . Acute pulmonary embolism ([place]) 10/04/2019  . CHF (congestive heart failure) ([place])   . Inappropriate sinus tachycardia   . Chest pain 11/03/2013  . Tobacco abuse 11/03/2013    Past Surgical History:  Procedure Laterality Date  . RIGHT/LEFT HEART CATH AND CORONARY ANGIOGRAPHY N/A 10/06/2019   Procedure: RIGHT/LEFT HEART CATH AND CORONARY ANGIOGRAPHY;  Surgeon: [name], MD;   Location: [facility];  Service: Cardiovascular;  Laterality: N/A;  . WRIST SURGERY         Family History  Problem Relation Age of Onset  . Heart failure Father   . Diabetes Father   . Kidney disease Father     Social History   Tobacco Use  . Smoking status: Current Every Day Smoker    Packs/day: 0.50    Types: Cigarettes  . Smokeless tobacco: Never Used  Vaping Use  . Vaping Use: Former  Substance Use Topics  . Alcohol use: Yes  . Drug use: Yes    Types: Marijuana    Home Medications Prior to Admission medications   Medication Sig Start Date End Date Taking? Authorizing Provider  digoxin (LANOXIN) 0.125 MG tablet Take 1 tablet (0.125 mg total) by mouth daily. 10/14/19   Lyda Jester M, PA-C  folic acid (FOLVITE) 1 MG tablet Take 1 tablet (1 mg total) by mouth daily. 10/14/19   Lyda Jester M, PA-C  furosemide (LASIX) 80 MG tablet Take 1 tablet (80 mg total) by mouth daily. 10/25/19   Clegg, Amy D, NP  ivabradine (CORLANOR) 5 MG TABS tablet Take 1 tablet (5 mg total) by mouth 2 (two) times daily with a meal. 10/13/19   Lyda Jester M, PA-C  losartan (COZAAR) 25 MG tablet Take 0.5 tablets (12.5 mg total) by mouth 2 (two) times daily. 10/13/19   Lyda Jester M, PA-C  Magnesium Oxide 200 MG TABS Take  1 tablet (200 mg total) by mouth 2 (two) times daily. 10/28/19   Clegg, Amy D, NP  rivaroxaban (XARELTO) 20 MG TABS tablet Take 1 tablet (20 mg total) by mouth daily. 10/28/19   Lyda Jester M, PA-C  RIVAROXABAN [employer]) VTE STARTER PACK (15 & 20 MG TABLETS) Follow package directions: Take one 15mg  tablet by mouth twice a day. On day 22, switch to one 20mg  tablet once a day. Take with food. 10/13/19   Consuelo Pandy, PA-C    Allergies    Patient has no known allergies.  Review of Systems   Review of Systems  Cardiovascular: Positive for chest pain.  All other systems reviewed and are negative.   Physical Exam Updated Vital Signs BP 101/71  (BP Location: Left Arm)   Pulse 85   Temp 98.1 F (36.7 C) (Oral)   Resp 18   Ht 5\' 10"  (1.778 m)   Wt 81.6 kg   SpO2 98%   BMI 25.81 kg/m   Physical Exam Vitals and nursing note reviewed.  Constitutional:      General: He is in acute distress.     Appearance: He is well-developed. He is ill-appearing.  HENT:     Head: Normocephalic and atraumatic.  Cardiovascular:     Rate and Rhythm: Normal rate and regular rhythm.     Heart sounds: No murmur heard.   Pulmonary:     Effort: Pulmonary effort is normal. No respiratory distress.     Breath sounds: Normal breath sounds.  Abdominal:     Palpations: Abdomen is soft.     Tenderness: There is no abdominal tenderness. There is no guarding or rebound.  Genitourinary:    Comments: Small amount of black stool in rectal vault Musculoskeletal:        General: No tenderness.  Skin:    General: Skin is warm and dry.  Neurological:     Mental Status: He is alert and oriented to person, place, and time.  Psychiatric:        Behavior: Behavior normal.     ED Results / Procedures / Treatments   Labs (all labs ordered are listed, but only abnormal results are displayed) Labs Reviewed  POC OCCULT BLOOD, ED - Abnormal; Notable for the following components:      Result Value   Fecal Occult Bld POSITIVE (*)    All other components within normal limits  SARS CORONAVIRUS 2 BY RT PCR (HOSPITAL ORDER, [facility])  CBC  BASIC METABOLIC PANEL  HEPATIC FUNCTION PANEL  ETHANOL  LIPASE, BLOOD  PROTIME-INR  DIGOXIN LEVEL  TYPE AND SCREEN  TROPONIN I (HIGH SENSITIVITY)  TROPONIN I (HIGH SENSITIVITY)    EKG None  Radiology DG Chest 2 View  Result Date: 10/30/2019 CLINICAL DATA:  Chest pain, shortness of breath. History of congestive heart failure. EXAM: CHEST - 2 VIEW COMPARISON:  Chest radiograph dated 10/24/2019 FINDINGS: The cardiac silhouette is enlarged and there is suggestion of a pericardial  effusion. Both lungs are clear. The visualized skeletal structures are unremarkable. IMPRESSION: Enlarged cardiac silhouette with suggestion of a pericardial effusion. Clear lungs. Electronically Signed   By: Zerita Boers M.D.   On: 10/30/2019 18:39    Procedures Procedures (including critical care time)  Medications Ordered in ED Medications  sodium chloride flush (NS) 0.9 % injection 3 mL (has no administration in time range)    ED Course  I have reviewed the triage vital signs and the nursing  notes.  Pertinent labs & imaging results that were available during my care of the patient were reviewed by me and considered in my medical decision making (see chart for details).    MDM Rules/Calculators/A&P                         Patient with history of cardiomyopathy, PE on anticoagulation here for evaluation of chest pain, vomiting, black stools. He is ill appearing on evaluation. He has dark stool on exam that is chemical positive. Hemoglobin is within normal limits. He is hemodynamically stable on ED assessment. Given his anticoagulation and risk factors discussed with Dr. Therisa Doyne, with G.I. She recommends medicine consulted if he develops stigmata of G.I. bleed. Discussed with patient findings of studies and concern for bleeding and recommendation for admission for observation and he is in agreement with treatment plan. Medicine consulted for admission for further treatment and work up.  Final Clinical Impression(s) / ED Diagnoses Final diagnoses:  None    Rx / DC Orders ED Discharge Orders    None       Quintella Reichert, MD 10/31/19 0000

## 2019-10-30 NOTE — ED Notes (Signed)
Pt went to [facility]

## 2019-10-30 NOTE — Progress Notes (Signed)
Patient arrived from the ED on a stretcher,assessment completed see flow sheet, placed on tele CCMD notified, patient oriented to room and staff, bed in lowest position call bell within reach will continue to monitor.

## 2019-10-30 NOTE — ED Notes (Signed)
Pt complaining of shakes and stated " I feel like I am withdrawing from, alcohol". Dr. Posey Pronto contacted.

## 2019-10-30 NOTE — H&P (Signed)
History and Physical    Robert [name] DXC:[id number] DOB: Mar 14, 1981 DOA: 10/30/2019  PCP: Patient, No Pcp Per  Patient coming from: Home  I have personally briefly reviewed patient's old medical records in [facility]  Chief Complaint: Nausea, vomiting, dark stools  HPI: Robert [name] is a 39 y.o. 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  Patient was recently hospitalized from 10/04/2019-10/13/2019 for acute systolic heart failure with biventricular dysfunction and low output.  Right/left heart catheterization showed normal coronary arteries.  CTA chest showed no pulmonary emboli in the left lower lobe of the lung for which she was started on Xarelto.  He was found to have a moderate-sized pericardial effusion without tamponade physiology.  He required milrinone and diuresis.  He was weaned off of milrinone and discharged to home on Lasix 20 mg daily, losartan, digoxin, and Corlanor.  He did not tolerate spironolactone due to hyperkalemia.  He cannot tolerate Entresto and BiDil due to soft blood pressures.  He was not placed on beta-blocker due to low output.  He was seen in follow-up at the heart failure clinic on 10/25/2019 was noted to be volume overloaded.  He was given IV Lasix 80 mg once with 40 mEq of potassium in the clinic and Lasix was increased to 80 mg to start the following day.  Patient states he feels his volume status and dyspnea from CHF have improved however yesterday he began to have significant central chest discomfort with heartburn and acid reflux.  He has been having nausea, vomiting, and coughing up small amounts of bright red blood.  He says he was forcing himself to throw up the abdominal discomfort yesterday and that is when  he noticed blood.  He is also seen dark black color stools without BRBPR.  He has had some lightheadedness and fell yesterday but denies any significant injury or loss of consciousness.  He reports good urine output without dysuria.  He denies any diarrhea.  He says he feels dehydrated and thirsty.  He says he is still drinking alcohol, a couple airplane bottles of liquor per day.  He reports smoking about 3 cigarettes/day.  He reports adherence to Xarelto with last dose taken morning of 10/30/2019.   ED Course:  Initial vitals showed BP 101/71, pulse 85, RR 18, temp 98.1 Fahrenheit, SPO2 98% on room air.  Labs are notable for creatinine 1.56 (0.99 on 10/25/2019), BUN 28, sodium 136, potassium 3.8, bicarb 22, serum glucose 109, WBC 4.5, hemoglobin 13.9, platelets 274,000, high-sensitivity troponin I 32 >> 31, INR 3.8, lipase 29, AST 56, ALT 61, alk phos 49, total bilirubin 1.1.  Serum ethanol level elevated at 341.  Digoxin level low at 0.6.  FOBT is positive.  SARS-CoV-2 PCR is negative.  2 view chest x-ray showed enlarged cardiac silhouette suggestive of pericardial effusion.  No focal consolidation, or pulmonary edema seen.  Patient was given IV Protonix 80 mg once and placed on Protonix infusion.  EDP discussed with on-call GI who felt there was no significant GI bleeding and to call GI services again if needed.  The hospitalist service was consulted to admit for further evaluation management.  Review of Systems: All systems reviewed and are negative except as documented in history of present illness above.   Past Medical  History:  Diagnosis Date  . Alcohol use   . Asthma   . CHF (congestive heart failure) ([place])     Past Surgical History:  Procedure Laterality Date  . RIGHT/LEFT HEART CATH AND CORONARY ANGIOGRAPHY N/A 10/06/2019   Procedure: RIGHT/LEFT HEART CATH AND CORONARY ANGIOGRAPHY;  Surgeon: [name], MD;  Location: [place] [facility];  Service: Cardiovascular;  Laterality:  N/A;  . WRIST SURGERY      Social History:  reports that he has been smoking cigarettes. He has been smoking about 0.50 packs per day. He has never used smokeless tobacco. He reports current alcohol use. He reports current drug use. Drug: Marijuana.  Allergies  Allergen Reactions  . Bactrim [Sulfamethoxazole-Trimethoprim] Rash    Family History  Problem Relation Age of Onset  . Heart failure Father   . Diabetes Father   . Kidney disease Father      Prior to Admission medications   Medication Sig Start Date End Date Taking? Authorizing Provider  digoxin (LANOXIN) 0.125 MG tablet Take 1 tablet (0.125 mg total) by mouth daily. 10/14/19  Yes Lyda Jester M, PA-C  folic acid (FOLVITE) 1 MG tablet Take 1 tablet (1 mg total) by mouth daily. 10/14/19  Yes Rosita Fire, Brittainy M, PA-C  furosemide (LASIX) 80 MG tablet Take 1 tablet (80 mg total) by mouth daily. 10/25/19  Yes Clegg, Amy D, NP  ivabradine (CORLANOR) 5 MG TABS tablet Take 1 tablet (5 mg total) by mouth 2 (two) times daily with a meal. 10/13/19  Yes Rosita Fire, Brittainy M, PA-C  losartan (COZAAR) 25 MG tablet Take 0.5 tablets (12.5 mg total) by mouth 2 (two) times daily. 10/13/19  Yes Lyda Jester M, PA-C  rivaroxaban (XARELTO) 20 MG TABS tablet Take 1 tablet (20 mg total) by mouth daily. 10/28/19  Yes Lyda Jester M, PA-C  Magnesium Oxide 200 MG TABS Take 1 tablet (200 mg total) by mouth 2 (two) times daily. 10/28/19   Clegg, Amy D, NP  RIVAROXABAN Alveda Reasons) VTE STARTER PACK (15 & 20 MG TABLETS) Follow package directions: Take one '15mg'$  tablet by mouth twice a day. On day 22, switch to one '20mg'$  tablet once a day. Take with food. Patient not taking: Reported on 10/30/2019 10/13/19   Consuelo Pandy, PA-C    Physical Exam: Vitals:   10/30/19 2115 10/30/19 2132 10/30/19 2200 10/30/19 2215  BP: (!) 93/57 108/60 97/62 105/76  Pulse: 90 84 83 (!) 104  Resp: (!) '27 13 20 16  '$ Temp:      TempSrc:      SpO2: 99% 100% 100% (!)  89%  Weight:      Height:       Constitutional: Resting supine in bed, NAD, calm, comfortable Eyes: PERRL, lids and conjunctivae normal ENMT: Mucous membranes are dry. Posterior pharynx clear of any exudate or lesions.Normal dentition.  Neck: normal, supple, no masses. Respiratory: clear to auscultation bilaterally, no wheezing, no crackles. Normal respiratory effort. No accessory muscle use.  Cardiovascular: Regular rate and rhythm, no murmurs / rubs / gallops. No extremity edema. 2+ pedal pulses. Abdomen: no tenderness, no masses palpated. No hepatosplenomegaly. Bowel sounds positive.  Musculoskeletal: no clubbing / cyanosis. No joint deformity upper and lower extremities. Good ROM, no contractures. Normal muscle tone.  Skin: no rashes, lesions, ulcers. No induration Neurologic: CN 2-12 grossly intact. Sensation intact, Strength 5/5 in all 4.  Psychiatric: Normal judgment and insight. Alert and oriented x 3.  Depressed mood.    Labs on  Admission: I have personally reviewed following labs and imaging studies  CBC: Recent Labs  Lab 10/24/19 2247 10/25/19 1222 10/30/19 1757  WBC 5.1 7.4 4.5  HGB 11.5* 11.6* 13.9  HCT 35.7* 36.5* 41.3  MCV 90.6 91.3 86.8  PLT 280 275 716   Basic Metabolic Panel: Recent Labs  Lab 10/24/19 2247 10/25/19 1230 10/30/19 1757  NA 140 136 136  K 3.9 3.6 3.8  CL 107 104 96*  CO2 22 20* 22  GLUCOSE 91 103* 109*  BUN 23* 20 28*  CREATININE 1.06 0.99 1.56*  CALCIUM 8.8* 8.7* 8.7*  MG  --  1.6*  --    GFR: Estimated Creatinine Clearance: 66.3 mL/min (A) (by C-G formula based on SCr of 1.56 mg/dL (H)). Liver Function Tests: Recent Labs  Lab 10/30/19 2015  AST 56*  ALT 61*  ALKPHOS 49  BILITOT 1.1  PROT 7.6  ALBUMIN 4.3   Recent Labs  Lab 10/30/19 2015  LIPASE 29   No results for input(s): AMMONIA in the last 168 hours. Coagulation Profile: Recent Labs  Lab 10/24/19 2247 10/30/19 2015  INR 2.0* 3.8*   Cardiac Enzymes: No  results for input(s): CKTOTAL, CKMB, CKMBINDEX, TROPONINI in the last 168 hours. BNP (last 3 results) No results for input(s): PROBNP in the last 8760 hours. HbA1C: No results for input(s): HGBA1C in the last 72 hours. CBG: No results for input(s): GLUCAP in the last 168 hours. Lipid Profile: No results for input(s): CHOL, HDL, LDLCALC, TRIG, CHOLHDL, LDLDIRECT in the last 72 hours. Thyroid Function Tests: No results for input(s): TSH, T4TOTAL, FREET4, T3FREE, THYROIDAB in the last 72 hours. Anemia Panel: No results for input(s): VITAMINB12, FOLATE, FERRITIN, TIBC, IRON, RETICCTPCT in the last 72 hours. Urine analysis: No results found for: COLORURINE, APPEARANCEUR, LABSPEC, [place], GLUCOSEU, HGBUR, BILIRUBINUR, KETONESUR, PROTEINUR, UROBILINOGEN, NITRITE, LEUKOCYTESUR  Radiological Exams on Admission: DG Chest 2 View  Result Date: 10/30/2019 CLINICAL DATA:  Chest pain, shortness of breath. History of congestive heart failure. EXAM: CHEST - 2 VIEW COMPARISON:  Chest radiograph dated 10/24/2019 FINDINGS: The cardiac silhouette is enlarged and there is suggestion of a pericardial effusion. Both lungs are clear. The visualized skeletal structures are unremarkable. IMPRESSION: Enlarged cardiac silhouette with suggestion of a pericardial effusion. Clear lungs. Electronically Signed   By: Zerita Boers M.D.   On: 10/30/2019 18:39    EKG: Independently reviewed. Sinus rhythm, biatrial enlargement, T wave inversion V4-V5, QTC 477.  Similar to prior.  Assessment/Plan Active Problems:   Chronic systolic CHF (congestive heart failure) (HCC)   Pulmonary embolism (HCC)   AKI (acute kidney injury) ([place])   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HCC)   Gastritis   Elevated INR  Robert [name] is a 39 y.o. 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  Gastritis/positive FOBT/Elevated INR: Presentation suggestive of gastritis possible mild upper GI bleeding in the setting of alcohol and Xarelto use.  Hemoglobin is currently stable.  Per EDP, on-call GI did not think other significant upper GI bleeding and recommended reconsulting if needed. -Continue IV Protonix 40 mg twice daily -Hold Xarelto, avoid NSAIDs -Repeat CBC in the morning -No indication for blood transfusion at this time  Alcohol use disorder with acute intoxication: Patient reports ongoing alcohol use and is actively  intoxicated on ED arrival with elevated serum ethanol level. -Continue management as above -Consult to TOC -Monitor CIWA  AKI: Suspect related to GI losses and exacerbated while on losartan and Lasix.  He does appear clinically mildly hypervolemic. -Gentle IV fluid hydration with NS '@75'$  mL/hr x 10 hours -Repeat labs in the morning -Hold Lasix and losartan tonight  Chronic systolic CHF with biventricular failure/NICM/pericardial effusion: EF 15% with LV global hypokinesis.  Felt secondary to alcohol/cocaine use.  Clinically appears mildly hypovolemic as above.  On gentle IV fluids overnight. -Monitor strict I/O's and daily weights -Holding losartan and Lasix for now, restart as soon as able -Continue Corlanor and digoxin  Substance use disorder: Prior history of cocaine use.  Denies current illicit drug use.  Check UDS.  DVT prophylaxis: SCDs Code Status: Full code Family Communication: Discussed with patient's mother at bedside Disposition Plan: From home and likely discharge to home pending hemodynamic stability and improvement in renal function Consults called: EDP discussed with on-call GI Admission status:  Status is: Observation  The patient remains OBS appropriate and will d/c before 2 midnights.  Dispo: The patient is from:  Home              Anticipated d/c is to: Home              Anticipated d/c date is: 1 day              Patient currently is not medically stable to d/c.   Zada Finders MD [facility]  If 7PM-7AM, please contact night-coverage [email or web address]  10/30/2019, 10:22 PM

## 2019-10-30 NOTE — ED Triage Notes (Signed)
Reports he started to feel unwell with rt sided chest pain yesterday, chest pain has occurred in the past, seen at [facility] on 6/13 for related complaint.

## 2019-10-30 NOTE — ED Triage Notes (Signed)
The pt is c/o chest pain for 2 months  With nausea and vomiting  Hx of chf  He went to [facility] ed and he thinks they took to long and he left and came here

## 2019-10-30 NOTE — ED Notes (Signed)
Pt has been called x 3 to place in room, no answer

## 2019-10-31 DIAGNOSIS — R791 Abnormal coagulation profile: Secondary | ICD-10-CM | POA: Diagnosis present

## 2019-10-31 DIAGNOSIS — F17210 Nicotine dependence, cigarettes, uncomplicated: Secondary | ICD-10-CM | POA: Diagnosis present

## 2019-10-31 DIAGNOSIS — N183 Chronic kidney disease, stage 3 unspecified: Secondary | ICD-10-CM | POA: Diagnosis present

## 2019-10-31 DIAGNOSIS — Z86711 Personal history of pulmonary embolism: Secondary | ICD-10-CM | POA: Diagnosis not present

## 2019-10-31 DIAGNOSIS — I130 Hypertensive heart and chronic kidney disease with heart failure and stage 1 through stage 4 chronic kidney disease, or unspecified chronic kidney disease: Secondary | ICD-10-CM | POA: Diagnosis present

## 2019-10-31 DIAGNOSIS — I340 Nonrheumatic mitral (valve) insufficiency: Secondary | ICD-10-CM | POA: Diagnosis present

## 2019-10-31 DIAGNOSIS — D62 Acute posthemorrhagic anemia: Secondary | ICD-10-CM | POA: Diagnosis present

## 2019-10-31 DIAGNOSIS — F1410 Cocaine abuse, uncomplicated: Secondary | ICD-10-CM | POA: Diagnosis present

## 2019-10-31 DIAGNOSIS — I428 Other cardiomyopathies: Secondary | ICD-10-CM | POA: Diagnosis present

## 2019-10-31 DIAGNOSIS — Z881 Allergy status to other antibiotic agents status: Secondary | ICD-10-CM | POA: Diagnosis not present

## 2019-10-31 DIAGNOSIS — R072 Precordial pain: Secondary | ICD-10-CM | POA: Diagnosis present

## 2019-10-31 DIAGNOSIS — Z79899 Other long term (current) drug therapy: Secondary | ICD-10-CM | POA: Diagnosis not present

## 2019-10-31 DIAGNOSIS — I5022 Chronic systolic (congestive) heart failure: Secondary | ICD-10-CM | POA: Diagnosis present

## 2019-10-31 DIAGNOSIS — E875 Hyperkalemia: Secondary | ICD-10-CM | POA: Diagnosis present

## 2019-10-31 DIAGNOSIS — I313 Pericardial effusion (noninflammatory): Secondary | ICD-10-CM | POA: Diagnosis present

## 2019-10-31 DIAGNOSIS — Y908 Blood alcohol level of 240 mg/100 ml or more: Secondary | ICD-10-CM | POA: Diagnosis present

## 2019-10-31 DIAGNOSIS — E860 Dehydration: Secondary | ICD-10-CM | POA: Diagnosis present

## 2019-10-31 DIAGNOSIS — I5082 Biventricular heart failure: Secondary | ICD-10-CM | POA: Diagnosis present

## 2019-10-31 DIAGNOSIS — K219 Gastro-esophageal reflux disease without esophagitis: Secondary | ICD-10-CM | POA: Diagnosis present

## 2019-10-31 DIAGNOSIS — Z7901 Long term (current) use of anticoagulants: Secondary | ICD-10-CM | POA: Diagnosis not present

## 2019-10-31 DIAGNOSIS — K3189 Other diseases of stomach and duodenum: Secondary | ICD-10-CM | POA: Diagnosis present

## 2019-10-31 DIAGNOSIS — N179 Acute kidney failure, unspecified: Secondary | ICD-10-CM | POA: Diagnosis present

## 2019-10-31 DIAGNOSIS — K2921 Alcoholic gastritis with bleeding: Secondary | ICD-10-CM | POA: Diagnosis present

## 2019-10-31 DIAGNOSIS — F10231 Alcohol dependence with withdrawal delirium: Secondary | ICD-10-CM | POA: Diagnosis present

## 2019-10-31 DIAGNOSIS — Z20822 Contact with and (suspected) exposure to covid-19: Secondary | ICD-10-CM | POA: Diagnosis present

## 2019-10-31 LAB — CBC
HCT: 41.2 % (ref 39.0–52.0)
HCT: 38.4 % — ABNORMAL LOW (ref 39.0–52.0)
HCT: 37.1 % — ABNORMAL LOW (ref 39.0–52.0)
Hemoglobin: 13.9 g/dL (ref 13.0–17.0)
Hemoglobin: 12.6 g/dL — ABNORMAL LOW (ref 13.0–17.0)
Hemoglobin: 12.1 g/dL — ABNORMAL LOW (ref 13.0–17.0)
MCH: 29.1 pg (ref 26.0–34.0)
MCH: 28.5 pg (ref 26.0–34.0)
MCH: 28.8 pg (ref 26.0–34.0)
MCHC: 33.7 g/dL (ref 30.0–36.0)
MCHC: 32.8 g/dL (ref 30.0–36.0)
MCHC: 32.6 g/dL (ref 30.0–36.0)
MCV: 86.4 fL (ref 80.0–100.0)
MCV: 86.9 fL (ref 80.0–100.0)
MCV: 88.3 fL (ref 80.0–100.0)
Platelets: 241 10*3/uL (ref 150–400)
Platelets: 215 10*3/uL (ref 150–400)
Platelets: 193 10*3/uL (ref 150–400)
RBC: 4.77 MIL/uL (ref 4.22–5.81)
RBC: 4.42 MIL/uL (ref 4.22–5.81)
RBC: 4.2 MIL/uL — ABNORMAL LOW (ref 4.22–5.81)
RDW: 13.3 % (ref 11.5–15.5)
RDW: 13.2 % (ref 11.5–15.5)
RDW: 13.1 % (ref 11.5–15.5)
WBC: 5.3 10*3/uL (ref 4.0–10.5)
WBC: 5.8 10*3/uL (ref 4.0–10.5)
WBC: 5.5 10*3/uL (ref 4.0–10.5)
nRBC: 0 % (ref 0.0–0.2)
nRBC: 0 % (ref 0.0–0.2)
nRBC: 0 % (ref 0.0–0.2)

## 2019-10-31 LAB — COMPREHENSIVE METABOLIC PANEL
ALT: 66 U/L — ABNORMAL HIGH (ref 0–44)
AST: 67 U/L — ABNORMAL HIGH (ref 15–41)
Albumin: 4.4 g/dL (ref 3.5–5.0)
Alkaline Phosphatase: 47 U/L (ref 38–126)
Anion gap: 14 (ref 5–15)
BUN: 27 mg/dL — ABNORMAL HIGH (ref 6–20)
CO2: 27 mmol/L (ref 22–32)
Calcium: 8.6 mg/dL — ABNORMAL LOW (ref 8.9–10.3)
Chloride: 94 mmol/L — ABNORMAL LOW (ref 98–111)
Creatinine, Ser: 1.54 mg/dL — ABNORMAL HIGH (ref 0.61–1.24)
GFR calc Af Amer: >60 mL/min (ref >60)
GFR calc non Af Amer: 56 mL/min — ABNORMAL LOW (ref >60)
Glucose, Bld: 87 mg/dL (ref 70–99)
Potassium: 3.8 mmol/L (ref 3.5–5.1)
Sodium: 135 mmol/L (ref 135–145)
Total Bilirubin: 1.5 mg/dL — ABNORMAL HIGH (ref 0.3–1.2)
Total Protein: 7.6 g/dL (ref 6.5–8.1)

## 2019-10-31 LAB — PROTIME-INR
INR: 2.8 — ABNORMAL HIGH (ref 0.8–1.2)
Prothrombin Time: 28.4 seconds — ABNORMAL HIGH (ref 11.4–15.2)

## 2019-10-31 LAB — RAPID URINE DRUG SCREEN, HOSP PERFORMED
Amphetamines: NOT DETECTED
Barbiturates: NOT DETECTED
Benzodiazepines: NOT DETECTED
Cocaine: NOT DETECTED
Opiates: NOT DETECTED
Tetrahydrocannabinol: POSITIVE — AB

## 2019-10-31 LAB — PHOSPHORUS: Phosphorus: 5.3 mg/dL — ABNORMAL HIGH (ref 2.5–4.6)

## 2019-10-31 LAB — MAGNESIUM: Magnesium: 1.7 mg/dL (ref 1.7–2.4)

## 2019-10-31 LAB — ABO/RH: ABO/RH(D): O POS

## 2019-10-31 MED ORDER — KCL IN DEXTROSE-NACL 10-5-0.45 MEQ/L-%-% IV SOLN
INTRAVENOUS | Status: DC
  Filled 2019-10-31 (×5): qty 1000

## 2019-10-31 MED ORDER — CHLORDIAZEPOXIDE HCL 5 MG PO CAPS
20.0000 mg | ORAL_CAPSULE | Freq: Three times a day (TID) | ORAL | Status: DC
  Administered 2019-10-31 – 2019-11-02 (×7): 20 mg via ORAL
  Filled 2019-10-31 (×7): qty 4

## 2019-10-31 MED ORDER — SODIUM CHLORIDE 0.9 % IV SOLN: INTRAVENOUS | Status: DC

## 2019-10-31 MED ORDER — CARVEDILOL 3.125 MG PO TABS
3.1250 mg | ORAL_TABLET | Freq: Two times a day (BID) | ORAL | Status: DC
  Administered 2019-10-31 – 2019-11-01 (×3): 3.125 mg via ORAL
  Filled 2019-10-31 (×5): qty 1

## 2019-10-31 MED ORDER — SUCRALFATE 1 GM/10ML PO SUSP
1.0000 g | Freq: Three times a day (TID) | ORAL | Status: DC
  Administered 2019-10-31 – 2019-11-02 (×9): 1 g via ORAL
  Filled 2019-10-31 (×9): qty 10

## 2019-10-31 MED ORDER — PANTOPRAZOLE SODIUM 40 MG IV SOLR
40.0000 mg | Freq: Two times a day (BID) | INTRAVENOUS | Status: DC
  Administered 2019-10-31 – 2019-11-02 (×5): 40 mg via INTRAVENOUS
  Filled 2019-10-31 (×5): qty 40

## 2019-10-31 NOTE — Consult Note (Signed)
[facility] Consult  Referring Provider: [facility] Primary Care Physician:  Patient, No Pcp Per Primary Gastroenterologist: Althia Forts  Reason for Consultation: Vomiting blood, black stools  HPI: Robert [name] is a 39 y.o. male with history of significant alcohol use, marijuana use, on Xarelto for pulmonary embolism presented to the ED with chest pain. GI was consulted because patient complained of black stools for 3 days along with vomiting small streaks of blood. Patient has been having acid reflux and heartburn, denies difficulty swallowing or pain on swallowing, intermittently vomits small amount of blood and also states that he coughs out small amount of blood. He has some epigastric discomfort but denies abdominal pain, denies recent unintentional weight loss or early satiety/bloating. No prior endoscopy or colonoscopy. Although only 75 he has significant comorbidities with biventricular failure, EF of 15% and history of pericardial effusion. Patient denies using NSAIDs recently. He drinks significant amount of alcohol on a daily basis usually vodka, "2 airplane sized bottles" for many years. Last dose of Xarelto on 10/30/2019.   Past Medical History:  Diagnosis Date  . Alcohol use   . Asthma   . CHF (congestive heart failure) ([place])     Past Surgical History:  Procedure Laterality Date  . RIGHT/LEFT HEART CATH AND CORONARY ANGIOGRAPHY N/A 10/06/2019   Procedure: RIGHT/LEFT HEART CATH AND CORONARY ANGIOGRAPHY;  Surgeon: [name], MD;  Location: [place] [facility];  Service: Cardiovascular;  Laterality: N/A;  . WRIST SURGERY      Prior to Admission medications   Medication Sig Start Date End Date Taking? Authorizing Provider  digoxin (LANOXIN) 0.125 MG tablet Take 1 tablet (0.125 mg total) by mouth daily. 10/14/19  Yes Lyda Jester M, PA-C  folic acid (FOLVITE) 1 MG tablet Take 1 tablet (1 mg total) by mouth daily. 10/14/19  Yes Rosita Fire,  Brittainy M, PA-C  furosemide (LASIX) 80 MG tablet Take 1 tablet (80 mg total) by mouth daily. 10/25/19  Yes Clegg, Amy D, NP  ivabradine (CORLANOR) 5 MG TABS tablet Take 1 tablet (5 mg total) by mouth 2 (two) times daily with a meal. 10/13/19  Yes Rosita Fire, Brittainy M, PA-C  losartan (COZAAR) 25 MG tablet Take 0.5 tablets (12.5 mg total) by mouth 2 (two) times daily. 10/13/19  Yes Lyda Jester M, PA-C  rivaroxaban (XARELTO) 20 MG TABS tablet Take 1 tablet (20 mg total) by mouth daily. 10/28/19  Yes Lyda Jester M, PA-C  Magnesium Oxide 200 MG TABS Take 1 tablet (200 mg total) by mouth 2 (two) times daily. 10/28/19   Clegg, Amy D, NP  RIVAROXABAN [employer]veda Reasons) VTE STARTER PACK (15 & 20 MG TABLETS) Follow package directions: Take one 15mg  tablet by mouth twice a day. On day 22, switch to one 20mg  tablet once a day. Take with food. Patient not taking: Reported on 10/30/2019 10/13/19   Consuelo Pandy, PA-C    Current Facility-Administered Medications  Medication Dose Route Frequency Provider Last Rate Last Admin  . carvedilol (COREG) tablet 3.125 mg  3.125 mg Oral BID Thurnell Lose, MD   3.125 mg at 10/31/19 0911  . chlordiazePOXIDE (LIBRIUM) capsule 20 mg  20 mg Oral TID Thurnell Lose, MD   20 mg at 10/31/19 0852  . dextrose 5 % and 0.45 % NaCl with KCl 10 mEq/L infusion   Intravenous Continuous Thurnell Lose, MD 75 mL/hr at 10/31/19 0907 New Bag at 10/31/19 0907  . digoxin (LANOXIN) tablet 0.125 mg  0.125 mg Oral Daily  Zada Finders R, MD   0.125 mg at 10/31/19 0853  . folic acid (FOLVITE) tablet 1 mg  1 mg Oral Daily Lenore Cordia, MD   1 mg at 10/31/19 0853  . ivabradine (CORLANOR) tablet 5 mg  5 mg Oral BID WC Lenore Cordia, MD   5 mg at 10/31/19 0852  . LORazepam (ATIVAN) tablet 1-4 mg  1-4 mg Oral Q1H PRN Lenore Cordia, MD       Or  . LORazepam (ATIVAN) injection 1-4 mg  1-4 mg Intravenous Q1H PRN Lenore Cordia, MD      . multivitamin with minerals tablet 1 tablet  1  tablet Oral Daily Lenore Cordia, MD   1 tablet at 10/31/19 0853  . ondansetron (ZOFRAN) tablet 4 mg  4 mg Oral Q6H PRN Lenore Cordia, MD       Or  . ondansetron (ZOFRAN) injection 4 mg  4 mg Intravenous Q6H PRN Lenore Cordia, MD   4 mg at 10/31/19 0834  . pantoprazole (PROTONIX) injection 40 mg  40 mg Intravenous Q12H Thurnell Lose, MD   40 mg at 10/31/19 0844  . sodium chloride flush (NS) 0.9 % injection 3 mL  3 mL Intravenous Once Quintella Reichert, MD      . sodium chloride flush (NS) 0.9 % injection 3 mL  3 mL Intravenous Q12H Zada Finders R, MD   3 mL at 10/31/19 0009  . sucralfate (CARAFATE) 1 GM/10ML suspension 1 g  1 g Oral TID WC & HS Lala Lund K, MD      . thiamine tablet 100 mg  100 mg Oral Daily Zada Finders R, MD       Or  . thiamine (B-1) injection 100 mg  100 mg Intravenous Daily Zada Finders R, MD   100 mg at 10/31/19 0845    Allergies as of 10/30/2019 - Review Complete 10/30/2019  Allergen Reaction Noted  . Bactrim [sulfamethoxazole-trimethoprim] Rash 10/30/2019    Family History  Problem Relation Age of Onset  . Heart failure Father   . Diabetes Father   . Kidney disease Father     Social History   Socioeconomic History  . Marital status: Single    Spouse name: Not on file  . Number of children: Not on file  . Years of education: Not on file  . Highest education level: Not on file  Occupational History  . Not on file  Tobacco Use  . Smoking status: Current Every Day Smoker    Packs/day: 0.50    Types: Cigarettes  . Smokeless tobacco: Never Used  Vaping Use  . Vaping Use: Former  Substance and Sexual Activity  . Alcohol use: Yes  . Drug use: Yes    Types: Marijuana  . Sexual activity: Not on file  Other Topics Concern  . Not on file  Social History Narrative  . Not on file   Social Determinants of Health   Financial Resource Strain:   . Difficulty of Paying Living Expenses:   Food Insecurity:   . Worried About Sales executive in the Last Year:   . Arboriculturist in the Last Year:   Transportation Needs:   . Film/video editor (Medical):   [name] Lack of Transportation (Non-Medical):   Physical Activity:   . Days of Exercise per Week:   . Minutes of Exercise per Session:   Stress:   . Feeling of Stress :  Social Connections:   . Frequency of Communication with Friends and Family:   . Frequency of Social Gatherings with Friends and Family:   . Attends Religious Services:   . Active Member of Clubs or Organizations:   . Attends Archivist Meetings:   [name] Marital Status:   Intimate Partner Violence:   . Fear of Current or Ex-Partner:   . Emotionally Abused:   [name] Physically Abused:   . Sexually Abused:     Review of Systems: Positive for: GI: Described in detail in HPI.    Gen: Denies any fever, chills, rigors, night sweats, anorexia, fatigue, weakness, malaise, involuntary weight loss, and sleep disorder CV: chest pain,Denies  angina, palpitations, syncope, orthopnea, PND, peripheral edema, and claudication. Resp: Denies dyspnea, cough, sputum, wheezing, coughing up blood. GU : Denies urinary burning, blood in urine, urinary frequency, urinary hesitancy, nocturnal urination, and urinary incontinence. MS: Denies joint pain or swelling.  Denies muscle weakness, cramps, atrophy.  Derm: Denies rash, itching, oral ulcerations, hives, unhealing ulcers.  Psych: Denies depression, anxiety, memory loss, suicidal ideation, hallucinations,  and confusion. Heme: Denies bruising and enlarged lymph nodes. Neuro:  Denies any headaches, dizziness, paresthesias. Endo:  Denies any problems with DM, thyroid, adrenal function.  Physical Exam: Vital signs in last 24 hours: Temp:  [98.1 F (36.7 C)-99 F (37.2 C)] 99 F (37.2 C) (06/20 0801) Pulse Rate:  [80-104] 83 (06/20 0911) Resp:  [13-27] 16 (06/20 0801) BP: (93-127)/(57-85) 126/64 (06/20 0911) SpO2:  [89 %-100 %] 100 % (06/20 0801) Weight:  [80.8  kg-81.6 kg] 80.8 kg (06/19 2335) Last BM Date: 10/29/19  General:   Alert,  Well-developed, well-nourished, pleasant and cooperative in NAD Head:  Normocephalic and atraumatic. Eyes:  Sclera clear, no icterus.   Conjunctiva pink. Ears:  Normal auditory acuity. Nose:  No deformity, discharge,  or lesions. Mouth:  No deformity or lesions.  Oropharynx pink & moist. Neck:  Supple; no masses or thyromegaly. Lungs:  Clear throughout to auscultation.   No wheezes, crackles, or rhonchi. No acute distress. Heart:  Regular rate and rhythm; no murmurs, clicks, rubs,  or gallops. Extremities:  Without clubbing or edema. Neurologic:  Alert and  oriented x4;  grossly normal neurologically. Skin:  Intact without significant lesions or rashes. Psych:  Alert and cooperative. Normal mood and affect. Abdomen:  Soft, nontender and nondistended. No masses, hepatosplenomegaly or hernias noted. Normal bowel sounds, without guarding, and without rebound.         Lab Results: Recent Labs    10/30/19 1757 10/31/19 0401 10/31/19 0802  WBC 4.5 5.3 5.8  HGB 13.9 13.9 12.6*  HCT 41.3 41.2 38.4*  PLT 274 241 215   BMET Recent Labs    10/30/19 1757 10/31/19 0401  NA 136 135  K 3.8 3.8  CL 96* 94*  CO2 22 27  GLUCOSE 109* 87  BUN 28* 27*  CREATININE 1.56* 1.54*  CALCIUM 8.7* 8.6*   LFT Recent Labs    10/30/19 2015 10/30/19 2015 10/31/19 0401  PROT 7.6   < > 7.6  ALBUMIN 4.3   < > 4.4  AST 56*   < > 67*  ALT 61*   < > 66*  ALKPHOS 49   < > 47  BILITOT 1.1   < > 1.5*  BILIDIR 0.2  --   --   IBILI 0.9  --   --    < > = values in this interval not displayed.   PT/INR Recent  Labs    10/30/19 2015 10/31/19 0401  LABPROT 35.9* 28.4*  INR 3.8* 2.8*    Studies/Results: DG Chest 2 View  Result Date: 10/30/2019 CLINICAL DATA:  Chest pain, shortness of breath. History of congestive heart failure. EXAM: CHEST - 2 VIEW COMPARISON:  Chest radiograph dated 10/24/2019 FINDINGS: The cardiac  silhouette is enlarged and there is suggestion of a pericardial effusion. Both lungs are clear. The visualized skeletal structures are unremarkable. IMPRESSION: Enlarged cardiac silhouette with suggestion of a pericardial effusion. Clear lungs. Electronically Signed   By: Zerita Boers M.D.   On: 10/30/2019 18:39    Impression: Vomiting small streaks of blood and black stools for 3 days-FOBT positive On Xarelto, last dose yesterday Hemoglobin relatively stable 13.9/12.6 BUN/creatinine ratio 27/1.54 PT/INR elevated at 28.4/2.8 U tox positive for THC Blood alcohol level of 341 on admission  Multiple comorbidities-congestive heart failure with severe biventricular failure EF 15%, history of pulmonary embolism, pericardial effusion    Plan: Start patient on clear liquid diet, plan EGD in a.m., patient will be off Xarelto for 2 days, hopefully INR will be less than 2. Meanwhile, continue Protonix at 40 mg every 12 hours, continue sucralfate 1 g suspension 4 times a day. Monitor for alcohol withdrawal, agree with folic acid, multivitamin, thiamine and IV fluids. Monitor H&H and transfuse if needed.   LOS: 0 days   Ronnette Juniper, MD  10/31/2019, 9:39 AM

## 2019-10-31 NOTE — Anesthesia Preprocedure Evaluation (Addendum)
Anesthesia Evaluation  Patient identified by MRN, date of birth, ID band Patient awake    Reviewed: Allergy & Precautions, NPO status , Patient's Chart, lab work & pertinent test results  Airway Mallampati: II  TM Distance: >3 FB     Dental   Pulmonary asthma , Current Smoker and Patient abstained from smoking.,    breath sounds clear to auscultation       Cardiovascular +CHF   Rhythm:Regular Rate:Normal  History noted. CG   Neuro/Psych    GI/Hepatic Neg liver ROS, History noted. CG   Endo/Other    Renal/GU Renal disease     Musculoskeletal   Abdominal   Peds  Hematology   Anesthesia Other Findings   Reproductive/Obstetrics                           Anesthesia Physical Anesthesia Plan  ASA: III  Anesthesia Plan: MAC   Post-op Pain Management:    Induction: Intravenous  PONV Risk Score and Plan: Ondansetron and Propofol infusion  Airway Management Planned: Nasal Cannula and Simple Face Mask  Additional Equipment:   Intra-op Plan:   Post-operative Plan:   Informed Consent: I have reviewed the patients History and Physical, chart, labs and discussed the procedure including the risks, benefits and alternatives for the proposed anesthesia with the patient or authorized representative who has indicated his/her understanding and acceptance.     Dental advisory given  Plan Discussed with: CRNA and Anesthesiologist  Anesthesia Plan Comments:       Anesthesia Quick Evaluation

## 2019-10-31 NOTE — Progress Notes (Signed)
PROGRESS NOTE                                                                                                                                                                                                             Patient Demographics:    Robert [name], is a 39 y.o. male, DOB - Sep 14, 1980, TGG:[id number]  Admit date - 10/30/2019   Admitting Physician Lenore Cordia, MD  Outpatient Primary MD for the patient is Patient, No Pcp Per  LOS - 0  Chief Complaint  Patient presents with  . Chest Pain       Brief Narrative  Tarence Searcy is a 39 y.o. 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   Subjective:    Gavin Potters today has, No headache, No chest pain, No abdominal pain - No Nausea, No new weakness tingling or numbness, No Cough - SOB.  +nausea and black stools.   Assessment  & Plan :     1.  Mild acute blood loss related anemia due to ongoing most likely upper GI bleed caused from gastritis along with possible Mallory-Weiss tear in the setting of Xarelto use.  This likely has been brought on by excessive alcohol intake, currently n.p.o. except medications, IV fluid, type screen done, monitoring H&H, IV PPI and Carafate, GI to see.  2.  Ongoing alcohol abuse, history of cocaine abuse.  Currently in DTs.  Placed on Librium along with CIWA protocol.  Counseled to quit.  3.  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  Recently had a thorough work-up by cardiology few weeks ago, currently compensated, continue digoxin and low-dose beta-blocker.  Due to AKI cannot use ACE/ARB or Entresto.  4.  AKI.  Due to  dehydration, gentle hydration and monitor, hold Lasix and nephrotoxic medications.     Condition - Extremely Guarded  Family Communication  :  Mother Sarita Haver [phone] - 10/31/19 Code Status :  Full  Consults  :  GI  Procedures  :    TTE 10/12/19 -  1. Left ventricular ejection fraction, by estimation, is <20%. The left ventricle has severely decreased function. The left ventricle demonstrates global hypokinesis. The left ventricular internal cavity size was  moderately to severely dilated. Left  ventricular diastolic function could not be evaluated.  2. Moderate pericardial effusion. There is no evidence of cardiac tamponade.  3. The mitral valve is normal in structure. Mild mitral valve regurgitation.  4. The aortic valve is tricuspid. Aortic valve regurgitation is not visualized. No aortic stenosis is present.  5. There is normal pulmonary artery systolic pressure.  6. The inferior vena cava is normal in size with <50% respiratory variability, suggesting right atrial pressure of 8 mmHg.    PUD Prophylaxis : PPI  Disposition Plan  :    Status is: change to Inpt  Dispo: The patient is from: Home              Anticipated d/c is to: Home              Anticipated d/c date is: 3 days              Patient currently is not medically stable to d/c.  DVT Prophylaxis  :   SCDs   Lab Results  Component Value Date   PLT 215 10/31/2019    Diet :  Diet Order            Diet NPO time specified Except for: Sips with Meds  Diet effective midnight                  Inpatient Medications Scheduled Meds: . chlordiazePOXIDE  20 mg Oral TID  . digoxin  0.125 mg Oral Daily  . folic acid  1 mg Oral Daily  . ivabradine  5 mg Oral BID WC  . multivitamin with minerals  1 tablet Oral Daily  . pantoprazole  40 mg Intravenous Q12H  . sodium chloride flush  3 mL Intravenous Once  . sodium chloride flush  3 mL Intravenous Q12H  . sucralfate  1 g Oral TID WC & HS  . thiamine   100 mg Oral Daily   Or  . thiamine  100 mg Intravenous Daily   Continuous Infusions: . dextrose 5 % and 0.45 % NaCl with KCl 10 mEq/L     PRN Meds:.LORazepam **OR** LORazepam, ondansetron **OR** ondansetron (ZOFRAN) IV  Antibiotics  :   Anti-infectives (From admission, onward)   None          Objective:   Vitals:   10/30/19 2335 10/30/19 2344 10/31/19 0409 10/31/19 0801  BP:  110/73 114/63 127/70  Pulse:  82 82 82  Resp:  18  16  Temp:  98.9 F (37.2 C) 98.6 F (37 C) 99 F (37.2 C)  TempSrc:  Oral Oral Oral  SpO2:  97% 100% 100%  Weight: 80.8 kg     Height: 5\' 10"  (1.778 m)       SpO2: 100 %  Wt Readings from Last 3 Encounters:  10/30/19 80.8 kg  10/30/19 88.3 kg  10/25/19 88.3 kg     Intake/Output Summary (Last 24 hours) at 10/31/2019 0849 Last data filed at 10/31/2019 8502 Gross per 24 hour  Intake 501.62 ml  Output 180 ml  Net 321.62 ml     Physical Exam  Awake Alert, No new F.N deficits, in mild DTs [place].AT,PERRAL Supple Neck,No JVD, No cervical lymphadenopathy appriciated.  Symmetrical Chest wall movement, Good air movement bilaterally, CTAB RRR,No Gallops,Rubs or new Murmurs, No Parasternal Heave +ve B.Sounds, Abd Soft, No tenderness, No organomegaly appriciated, No rebound - guarding or rigidity. No Cyanosis, Clubbing or edema, No new Rash or bruise  Data Review:    Recent Labs  Lab 10/24/19 2247 10/25/19 1222 10/30/19 1757 10/31/19 0401 10/31/19 0802  WBC 5.1 7.4 4.5 5.3 5.8  HGB 11.5* 11.6* 13.9 13.9 12.6*  HCT 35.7* 36.5* 41.3 41.2 38.4*  PLT 280 275 274 241 215  MCV 90.6 91.3 86.8 86.4 86.9  MCH 29.2 29.0 29.2 29.1 28.5  MCHC 32.2 31.8 33.7 33.7 32.8  RDW 13.5 13.2 13.3 13.3 13.2    Recent Labs  Lab 10/24/19 2247 10/25/19 1230 10/30/19 1757 10/30/19 2015 10/31/19 0401  NA 140 136 136  --  135  K 3.9 3.6 3.8  --  3.8  CL 107 104 96*  --  94*  CO2 22 20* 22  --  27  GLUCOSE 91 103* 109*  --  87  BUN 23* 20 28*  --   27*  CREATININE 1.06 0.99 1.56*  --  1.54*  CALCIUM 8.8* 8.7* 8.7*  --  8.6*  AST  --   --   --  56* 67*  ALT  --   --   --  61* 66*  ALKPHOS  --   --   --  49 47  BILITOT  --   --   --  1.1 1.5*  ALBUMIN  --   --   --  4.3 4.4  MG  --  1.6*  --   --  1.7  INR 2.0*  --   --  3.8* 2.8*    Recent Labs  Lab 10/30/19 2024  SARSCOV2NAA NEGATIVE    ------------------------------------------------------------------------------------------------------------------ No results for input(s): CHOL, HDL, LDLCALC, TRIG, CHOLHDL, LDLDIRECT in the last 72 hours.  No results found for: HGBA1C ------------------------------------------------------------------------------------------------------------------ No results for input(s): TSH, T4TOTAL, T3FREE, THYROIDAB in the last 72 hours.  Invalid input(s): FREET3 ------------------------------------------------------------------------------------------------------------------ No results for input(s): VITAMINB12, FOLATE, FERRITIN, TIBC, IRON, RETICCTPCT in the last 72 hours.  Coagulation profile Recent Labs  Lab 10/24/19 2247 10/30/19 2015 10/31/19 0401  INR 2.0* 3.8* 2.8*    No results for input(s): DDIMER in the last 72 hours.  Cardiac Enzymes No results for input(s): CKMB, TROPONINI, MYOGLOBIN in the last 168 hours.  Invalid input(s): CK ------------------------------------------------------------------------------------------------------------------    Component Value Date/Time   BNP 2,614.9 (H) 10/04/2019 1041    Micro Results Recent Results (from the past 240 hour(s))  SARS Coronavirus 2 by RT PCR (hospital order, performed in [facility] hospital lab) Nasopharyngeal Nasopharyngeal Swab     Status: None   Collection Time: 10/30/19  8:24 PM   Specimen: Nasopharyngeal Swab  Result Value Ref Range Status   SARS Coronavirus 2 NEGATIVE NEGATIVE Final    Comment: (NOTE) SARS-CoV-2 target nucleic acids are NOT DETECTED.  The  SARS-CoV-2 RNA is generally detectable in upper and lower respiratory specimens during the acute phase of infection. The lowest concentration of SARS-CoV-2 viral copies this assay can detect is 250 copies / mL. A negative result does not preclude SARS-CoV-2 infection and should not be used as the sole basis for treatment or other patient management decisions.  A negative result may occur with improper specimen collection / handling, submission of specimen other than nasopharyngeal swab, presence of viral mutation(s) within the areas targeted by this assay, and inadequate number of viral copies (<250 copies / mL). A negative result must be combined with clinical observations, patient history, and epidemiological information.  Fact Sheet for Patients:   StrictlyIdeas.no  Fact Sheet for Healthcare Providers: BankingDealers.co.za  This test is not yet approved  or  cleared by the [place] and has been authorized for detection and/or diagnosis of SARS-CoV-2 by FDA under an Emergency Use Authorization (EUA).  This EUA will remain in effect (meaning this test can be used) for the duration of the COVID-19 declaration under Section 564(b)(1) of the Act, 21 U.S.C. section 360bbb-3(b)(1), unless the authorization is terminated or revoked sooner.  Performed at [facility], [address] [place], [place] [address]     Radiology Reports DG Chest 2 View  Result Date: 10/30/2019 CLINICAL DATA:  Chest pain, shortness of breath. History of congestive heart failure. EXAM: CHEST - 2 VIEW COMPARISON:  Chest radiograph dated 10/24/2019 FINDINGS: The cardiac silhouette is enlarged and there is suggestion of a pericardial effusion. Both lungs are clear. The visualized skeletal structures are unremarkable. IMPRESSION: Enlarged cardiac silhouette with suggestion of a pericardial effusion. Clear lungs. Electronically Signed   By: Zerita Boers  M.D.   On: 10/30/2019 18:39      Time Spent in minutes  30   Lala Lund M.D on 10/31/2019 at 8:49 AM  To page go to [email or web address] - password [facility]

## 2019-11-01 ENCOUNTER — Encounter (HOSPITAL_COMMUNITY): Payer: Self-pay | Admitting: Internal Medicine

## 2019-11-01 ENCOUNTER — Encounter (HOSPITAL_COMMUNITY)
Disposition: A | Discharge status: Home / Self Care | Payer: Self-pay | Source: Home / Self Care | Attending: Internal Medicine

## 2019-11-01 ENCOUNTER — Inpatient Hospital Stay (HOSPITAL_COMMUNITY): Payer: BC Managed Care – PPO | Admitting: Anesthesiology

## 2019-11-01 HISTORY — PX: ESOPHAGOGASTRODUODENOSCOPY (EGD) WITH PROPOFOL: SHX5813

## 2019-11-01 LAB — CBC
HCT: 35.4 % — ABNORMAL LOW (ref 39.0–52.0)
Hemoglobin: 11.6 g/dL — ABNORMAL LOW (ref 13.0–17.0)
MCH: 28.9 pg (ref 26.0–34.0)
MCHC: 32.8 g/dL (ref 30.0–36.0)
MCV: 88.3 fL (ref 80.0–100.0)
Platelets: 181 10*3/uL (ref 150–400)
RBC: 4.01 MIL/uL — ABNORMAL LOW (ref 4.22–5.81)
RDW: 13 % (ref 11.5–15.5)
WBC: 4.1 10*3/uL (ref 4.0–10.5)
nRBC: 0 % (ref 0.0–0.2)

## 2019-11-01 LAB — BASIC METABOLIC PANEL
Anion gap: 9 (ref 5–15)
BUN: 23 mg/dL — ABNORMAL HIGH (ref 6–20)
CO2: 27 mmol/L (ref 22–32)
Calcium: 8.1 mg/dL — ABNORMAL LOW (ref 8.9–10.3)
Chloride: 98 mmol/L (ref 98–111)
Creatinine, Ser: 1.43 mg/dL — ABNORMAL HIGH (ref 0.61–1.24)
GFR calc Af Amer: >60 mL/min (ref >60)
GFR calc non Af Amer: >60 mL/min (ref >60)
Glucose, Bld: 98 mg/dL (ref 70–99)
Potassium: 3.8 mmol/L (ref 3.5–5.1)
Sodium: 134 mmol/L — ABNORMAL LOW (ref 135–145)

## 2019-11-01 LAB — BRAIN NATRIURETIC PEPTIDE: B Natriuretic Peptide: 859.2 pg/mL — ABNORMAL HIGH (ref 0.0–100.0)

## 2019-11-01 SURGERY — ESOPHAGOGASTRODUODENOSCOPY (EGD) WITH PROPOFOL
Anesthesia: Monitor Anesthesia Care

## 2019-11-01 SURGERY — CANCELLED PROCEDURE

## 2019-11-01 MED ORDER — LIDOCAINE 2% (20 MG/ML) 5 ML SYRINGE
INTRAMUSCULAR | Status: DC | PRN
  Administered 2019-11-01: 40 mg via INTRAVENOUS

## 2019-11-01 MED ORDER — PROPOFOL 500 MG/50ML IV EMUL
INTRAVENOUS | Status: DC | PRN
  Administered 2019-11-01: 100 ug/kg/min via INTRAVENOUS

## 2019-11-01 MED ORDER — LACTATED RINGERS IV SOLN
INTRAVENOUS | Status: AC | PRN
  Administered 2019-11-01: 1000 mL via INTRAVENOUS

## 2019-11-01 SURGICAL SUPPLY — 15 items

## 2019-11-01 SURGICAL SUPPLY — 15 items

## 2019-11-01 NOTE — H&P (Signed)
Patient presents to the endoscopy unit for EGD to evaluate melena and hematemesis.  History reviewed.  No changes.  Physical no distress  Heart regular  Lungs clear  Abdomen soft and nontender  Impression: Upper GI bleed  Plan: EGD

## 2019-11-01 NOTE — Progress Notes (Signed)
PROGRESS NOTE                                                                                                                                                                                                             Patient Demographics:    Robert [name], is a 39 y.o. male, DOB - 22-Jan-1981, JOI:[id number]  Admit date - 10/30/2019   Admitting Physician Lenore Cordia, MD  Outpatient Primary MD for the patient is Patient, No Pcp Per  LOS - 1  Chief Complaint  Patient presents with  . Chest Pain       Brief Narrative  Robert [name] is a 39 y.o. 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   Subjective:   Patient in bed, appears comfortable, denies any headache, no fever, no chest pain or pressure, no shortness of breath , no abdominal pain. No focal weakness.   Assessment  & Plan :     1.  Mild acute blood loss related anemia due to ongoing most likely upper GI bleed caused from gastritis along with possible Mallory-Weiss tear in the setting of Xarelto use.  This likely has been brought on by excessive alcohol intake, currently n.p.o. except medications, IV fluid, type screen done, monitoring H&H, IV PPI and Carafate, GI on board and is to undergo EGD later today on 11/01/2019.  Some element of hemodilution as well and has anemia, no need for transfusion.  2.  Ongoing alcohol abuse, history of cocaine abuse.  Currently in DTs.  Placed on Librium along with CIWA protocol.  Counseled to quit.  DTs improved with scheduled Librium.  3.  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  Recently had a thorough work-up  by cardiology few weeks ago, currently compensated, continue digoxin and low-dose beta-blocker.  Due to AKI cannot use ACE/ARB or Entresto.  4.  AKI.  Due to dehydration, gentle hydration and monitor, hold Lasix and nephrotoxic medications.  AKI improving.     Condition - Extremely Guarded  Family Communication  :  Mother Sarita Haver [phone] - 10/31/19, 11/01/19 Code Status :  Full  Consults  :  GI  Procedures  :    TTE 10/12/19 -  1.  Left ventricular ejection fraction, by estimation, is <20%. The left ventricle has severely decreased function. The left ventricle demonstrates global hypokinesis. The left ventricular internal cavity size was moderately to severely dilated. Left  ventricular diastolic function could not be evaluated.  2. Moderate pericardial effusion. There is no evidence of cardiac tamponade.  3. The mitral valve is normal in structure. Mild mitral valve regurgitation.  4. The aortic valve is tricuspid. Aortic valve regurgitation is not visualized. No aortic stenosis is present.  5. There is normal pulmonary artery systolic pressure.  6. The inferior vena cava is normal in size with <50% respiratory variability, suggesting right atrial pressure of 8 mmHg.    PUD Prophylaxis : PPI  Disposition Plan  :    Status is: change to Inpt  Dispo: The patient is from: Home              Anticipated d/c is to: Home              Anticipated d/c date is: 3 days              Patient currently is not medically stable to d/c.  DVT Prophylaxis  :   SCDs   Lab Results  Component Value Date   PLT 181 11/01/2019    Diet :  Diet Order            Diet NPO time specified  Diet effective midnight                  Inpatient Medications Scheduled Meds: . [MAR Hold] carvedilol  3.125 mg Oral BID  . [MAR Hold] chlordiazePOXIDE  20 mg Oral TID  . [MAR Hold] digoxin  0.125 mg Oral Daily  . [MAR Hold] folic acid  1 mg Oral Daily  . [MAR Hold] ivabradine  5 mg Oral BID  WC  . [MAR Hold] multivitamin with minerals  1 tablet Oral Daily  . [MAR Hold] pantoprazole  40 mg Intravenous Q12H  . [MAR Hold] sodium chloride flush  3 mL Intravenous Once  . [MAR Hold] sodium chloride flush  3 mL Intravenous Q12H  . [MAR Hold] sucralfate  1 g Oral TID WC & HS  . [MAR Hold] thiamine  100 mg Oral Daily   Or  . [MAR Hold] thiamine  100 mg Intravenous Daily   Continuous Infusions: . dextrose 5 % and 0.45 % NaCl with KCl 10 mEq/L 75 mL/hr at 10/31/19 2234  . lactated ringers 1,000 mL (11/01/19 0859)   PRN Meds:.lactated ringers, [MAR Hold] LORazepam **OR** [MAR Hold] LORazepam, [MAR Hold] ondansetron **OR** [MAR Hold] ondansetron (ZOFRAN) IV  Antibiotics  :   Anti-infectives (From admission, onward)   None          Objective:   Vitals:   11/01/19 0024 11/01/19 0500 11/01/19 0757 11/01/19 0858  BP: 113/83 113/77 107/75 114/79  Pulse: 81 83 80 82  Resp: 15 17 18 18   Temp: 98.4 F (36.9 C) 98.5 F (36.9 C) 98.4 F (36.9 C) 98.3 F (36.8 C)  TempSrc: Oral Oral Oral Oral  SpO2: 100% 100% 100% 100%  Weight:  83 kg  83 kg  Height:        SpO2: 100 %  Wt Readings from Last 3 Encounters:  11/01/19 83 kg  10/30/19 88.3 kg  10/25/19 88.3 kg     Intake/Output Summary (Last 24 hours) at 11/01/2019 0915 Last data filed at 11/01/2019 0758 Gross per 24 hour  Intake 180  ml  Output 220 ml  Net -40 ml     Physical Exam  Awake Alert, No new F.N deficits, improved DTs [place].AT,PERRAL Supple Neck,No JVD, No cervical lymphadenopathy appriciated.  Symmetrical Chest wall movement, Good air movement bilaterally, CTAB RRR,No Gallops, Rubs or new Murmurs, No Parasternal Heave +ve B.Sounds, Abd Soft, No tenderness, No organomegaly appriciated, No rebound - guarding or rigidity. No Cyanosis, Clubbing or edema, No new Rash or bruise      Data Review:    Recent Labs  Lab 10/30/19 1757 10/31/19 0401 10/31/19 0802 10/31/19 1623 11/01/19 0055  WBC 4.5 5.3 5.8  5.5 4.1  HGB 13.9 13.9 12.6* 12.1* 11.6*  HCT 41.3 41.2 38.4* 37.1* 35.4*  PLT 274 241 215 193 181  MCV 86.8 86.4 86.9 88.3 88.3  MCH 29.2 29.1 28.5 28.8 28.9  MCHC 33.7 33.7 32.8 32.6 32.8  RDW 13.3 13.3 13.2 13.1 13.0    Recent Labs  Lab 10/25/19 1230 10/30/19 1757 10/30/19 2015 10/31/19 0401 11/01/19 0055  NA 136 136  --  135 134*  K 3.6 3.8  --  3.8 3.8  CL 104 96*  --  94* 98  CO2 20* 22  --  27 27  GLUCOSE 103* 109*  --  87 98  BUN 20 28*  --  27* 23*  CREATININE 0.99 1.56*  --  1.54* 1.43*  CALCIUM 8.7* 8.7*  --  8.6* 8.1*  AST  --   --  56* 67*  --   ALT  --   --  61* 66*  --   ALKPHOS  --   --  49 47  --   BILITOT  --   --  1.1 1.5*  --   ALBUMIN  --   --  4.3 4.4  --   MG 1.6*  --   --  1.7  --   INR  --   --  3.8* 2.8*  --     Recent Labs  Lab 10/30/19 2024  SARSCOV2NAA NEGATIVE    ------------------------------------------------------------------------------------------------------------------ No results for input(s): CHOL, HDL, LDLCALC, TRIG, CHOLHDL, LDLDIRECT in the last 72 hours.  No results found for: HGBA1C ------------------------------------------------------------------------------------------------------------------ No results for input(s): TSH, T4TOTAL, T3FREE, THYROIDAB in the last 72 hours.  Invalid input(s): FREET3 ------------------------------------------------------------------------------------------------------------------ No results for input(s): VITAMINB12, FOLATE, FERRITIN, TIBC, IRON, RETICCTPCT in the last 72 hours.  Coagulation profile Recent Labs  Lab 10/30/19 2015 10/31/19 0401  INR 3.8* 2.8*    No results for input(s): DDIMER in the last 72 hours.  Cardiac Enzymes No results for input(s): CKMB, TROPONINI, MYOGLOBIN in the last 168 hours.  Invalid input(s): CK ------------------------------------------------------------------------------------------------------------------    Component Value Date/Time   BNP  2,614.9 (H) 10/04/2019 1041    Micro Results Recent Results (from the past 240 hour(s))  SARS Coronavirus 2 by RT PCR (hospital order, performed in [facility] hospital lab) Nasopharyngeal Nasopharyngeal Swab     Status: None   Collection Time: 10/30/19  8:24 PM   Specimen: Nasopharyngeal Swab  Result Value Ref Range Status   SARS Coronavirus 2 NEGATIVE NEGATIVE Final    Comment: (NOTE) SARS-CoV-2 target nucleic acids are NOT DETECTED.  The SARS-CoV-2 RNA is generally detectable in upper and lower respiratory specimens during the acute phase of infection. The lowest concentration of SARS-CoV-2 viral copies this assay can detect is 250 copies / mL. A negative result does not preclude SARS-CoV-2 infection and should not be used as the sole basis for treatment or  other patient management decisions.  A negative result may occur with improper specimen collection / handling, submission of specimen other than nasopharyngeal swab, presence of viral mutation(s) within the areas targeted by this assay, and inadequate number of viral copies (<250 copies / mL). A negative result must be combined with clinical observations, patient history, and epidemiological information.  Fact Sheet for Patients:   StrictlyIdeas.no  Fact Sheet for Healthcare Providers: BankingDealers.co.za  This test is not yet approved or  cleared by the [place] FDA and has been authorized for detection and/or diagnosis of SARS-CoV-2 by FDA under an Emergency Use Authorization (EUA).  This EUA will remain in effect (meaning this test can be used) for the duration of the COVID-19 declaration under Section 564(b)(1) of the Act, 21 U.S.C. section 360bbb-3(b)(1), unless the authorization is terminated or revoked sooner.  Performed at [facility], [place] [address], [place], [place] [address]     Radiology Reports DG Chest 2 View  Result Date: 10/30/2019 CLINICAL  DATA:  Chest pain, shortness of breath. History of congestive heart failure. EXAM: CHEST - 2 VIEW COMPARISON:  Chest radiograph dated 10/24/2019 FINDINGS: The cardiac silhouette is enlarged and there is suggestion of a pericardial effusion. Both lungs are clear. The visualized skeletal structures are unremarkable. IMPRESSION: Enlarged cardiac silhouette with suggestion of a pericardial effusion. Clear lungs. Electronically Signed   By: Zerita Boers M.D.   On: 10/30/2019 18:39      Time Spent in minutes  30   Lala Lund M.D on 11/01/2019 at 9:15 AM  To page go to [email or web address] - password [facility]

## 2019-11-01 NOTE — Evaluation (Signed)
Physical Therapy Evaluation - One-time  Patient Details Name: Robert [name] MRN: [id number] DOB: 1981-02-21 Today's Date: 11/01/2019   History of Present Illness  39 y.o. 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 EGD on 6/21 am, results pending but upper GIB suspected.  Clinical Impression   Pt presents with [facility] strength, seated/standing balance, gait, and activity tolerance this session. Pt ambulated 400 ft without AD and supervision level of assist, pt with mild R drift in hallway but other than that gait WFL. Pt appears to be mobilizing at baseline, pt with no further acute or post-acute PT needs. PT encourages pt to ambulate in hall with RN staff. PT to sign off, please reconsult if needed.      Follow Up Recommendations No PT follow up    Equipment Recommendations  None recommended by PT    Recommendations for Other Services       Precautions / Restrictions Precautions Precautions: None Restrictions Weight Bearing Restrictions: No      Mobility  Bed Mobility Overal bed mobility: Modified Independent                Transfers Overall transfer level: Modified independent               General transfer comment: reaches for environment to steady, no overt LOB or physical assist needed.  Ambulation/Gait Ambulation/Gait assistance: Supervision Gait Distance (Feet): 400 Feet Assistive device: None;IV Pole Gait Pattern/deviations: Step-through pattern;Decreased stride length;Drifts right/left Gait velocity: slighty decr   General Gait Details: Supervision for safety, initially pt using IV pole to self-steady, transitioning to no AD. Pt with preference for R drifting, which pt  is aware of during gait.  Stairs            Wheelchair Mobility    Modified Rankin (Stroke Patients Only)       Balance Overall balance assessment: Modified Independent                                           Pertinent Vitals/Pain Pain Assessment: No/denies pain    Home Living Family/patient expects to be discharged to:: Private residence Living Arrangements: Parent (mother) Available Help at Discharge: Family Type of Home: House (condo) Home Access: Stairs to enter Entrance Stairs-Rails: Right Entrance Stairs-Number of Steps: flight Home Layout: One level Home Equipment: None      Prior Function Level of Independence: Independent         Comments: pt works at [employer], has not worked in a couple of months due to covid outbreak at work and HF exacerbation     Hand Dominance   Dominant Hand: Right    Extremity/Trunk Assessment   Upper Extremity Assessment Upper Extremity Assessment: Overall WFL for tasks assessed    Lower Extremity Assessment Lower Extremity Assessment: Overall WFL for tasks assessed    Cervical / Trunk Assessment Cervical / Trunk Assessment: Normal  Communication   Communication: No difficulties  Cognition Arousal/Alertness: Awake/alert Behavior During Therapy: WFL for tasks assessed/performed Overall Cognitive Status: Within Functional Limits for tasks assessed  General Comments General comments (skin integrity, edema, etc.): HRmax during mobility 100 bpm, no chest pain reported    Exercises     Assessment/Plan    PT Assessment Patent does not need any further PT services  PT Problem List         PT Treatment Interventions      PT Goals (Current goals can be found in the Care Plan section)  Acute Rehab PT Goals PT Goal Formulation: With patient Time For Goal Achievement: 11/01/19 Potential to Achieve Goals: Good    Frequency      Barriers to discharge        Co-evaluation               AM-PAC PT "6 Clicks" Mobility  Outcome Measure Help needed turning from your back to your side while in a flat bed without using bedrails?: None Help needed moving from lying on your back to sitting on the side of a flat bed without using bedrails?: None Help needed moving to and from a bed to a chair (including a wheelchair)?: None Help needed standing up from a chair using your arms (e.g., wheelchair or bedside chair)?: None Help needed to walk in hospital room?: None Help needed climbing 3-5 steps with a railing? : A Little 6 Click Score: 23    End of Session   Activity Tolerance: Patient tolerated treatment well Patient left: in bed;with call bell/phone within reach Nurse Communication: Mobility status PT Visit Diagnosis: Other abnormalities of gait and mobility (R26.89)    Time: 9597-4718 PT Time Calculation (min) (ACUTE ONLY): 12 min   Charges:   PT Evaluation $PT Eval Low Complexity: 1 Low          Kammy Klett E, PT Acute Rehabilitation Services Pager [phone]  Office [phone]   Winfield Caba D Elonda Husky 11/01/2019, 4:18 PM

## 2019-11-01 NOTE — Transfer of Care (Signed)
Immediate Anesthesia Transfer of Care Note  Patient: Robert [name]  Procedure(s) Performed: ESOPHAGOGASTRODUODENOSCOPY (EGD) WITH PROPOFOL (N/A )  Patient Location: PACU and Endoscopy Unit  Anesthesia Type:MAC  Level of Consciousness: awake, patient cooperative and responds to stimulation  Airway & Oxygen Therapy: Patient Spontanous Breathing and Patient connected to nasal cannula oxygen  Post-op Assessment: Report given to RN and Post -op Vital signs reviewed and stable  Post vital signs: Reviewed and stable  Last Vitals:  Vitals Value Taken Time  BP 98/70 11/01/19 1006  Temp 36.6 C 11/01/19 1005  Pulse 82 11/01/19 1008  Resp 13 11/01/19 1008  SpO2 100 % 11/01/19 1008  Vitals shown include unvalidated device data.  Last Pain:  Vitals:   11/01/19 1005  TempSrc: Oral  PainSc: 0-No pain         Complications: No complications documented.

## 2019-11-01 NOTE — Anesthesia Postprocedure Evaluation (Signed)
Anesthesia Post Note  Patient: Robert [name]  Procedure(s) Performed: ESOPHAGOGASTRODUODENOSCOPY (EGD) WITH PROPOFOL (N/A )     Patient location during evaluation: Endoscopy Anesthesia Type: MAC Level of consciousness: awake Pain management: pain level controlled Vital Signs Assessment: post-procedure vital signs reviewed and stable Respiratory status: spontaneous breathing Cardiovascular status: stable Postop Assessment: no apparent nausea or vomiting Anesthetic complications: no   No complications documented.  Last Vitals:  Vitals:   11/01/19 1127 11/01/19 1517  BP: 106/78 110/78  Pulse: 75 85  Resp: 16 17  Temp: 36.8 C 37.1 C  SpO2: 100% 99%    Last Pain:  Vitals:   11/01/19 1517  TempSrc: Oral  PainSc:                  Kayode Petion

## 2019-11-01 NOTE — Progress Notes (Signed)
PT Cancellation Note  Patient Details Name: Wenzel Backlund MRN: [id number] DOB: June 30, 1980   Cancelled Treatment:    Reason Eval/Treat Not Completed: Patient at procedure or test/unavailable - will check back as schedule allows.  [facility] Pager [phone]  Office [phone]    Roxine Caddy D Elonda Husky 11/01/2019, 10:05 AM

## 2019-11-01 NOTE — Op Note (Signed)
[facility] Patient Name: Robert [name] Procedure Date : 11/01/2019 MRN: [id number] Attending MD: [name] , MD Date of Birth: 1980-10-07 CSN: [id number] Age: 39 Admit Type: Inpatient Procedure:                Upper GI endoscopy Indications:              Hematemesis, Melena Providers:                [name], MD, Angus Seller, Theodora Blow,                            Technician Referring MD:              Medicines:                Propofol per Anesthesia Complications:            No immediate complications. Estimated Blood Loss:     Estimated blood loss: none. Procedure:                Pre-Anesthesia Assessment:                           - Prior to the procedure, a History and Physical                            was performed, and patient medications and                            allergies were reviewed. The patient's tolerance of                            previous anesthesia was also reviewed. The risks                            and benefits of the procedure and the sedation                            options and risks were discussed with the patient.                            All questions were answered, and informed consent                            was obtained. Prior Anticoagulants: The patient has                            taken no previous anticoagulant or antiplatelet                            agents. ASA Grade Assessment: II - A patient with                            mild systemic disease. After reviewing the risks  and benefits, the patient was deemed in                            satisfactory condition to undergo the procedure.                           - Prior to the procedure, a History and Physical                            was performed, and patient medications and                            allergies were reviewed. The patient's tolerance of                            previous anesthesia was also reviewed. The  risks                            and benefits of the procedure and the sedation                            options and risks were discussed with the patient.                            All questions were answered, and informed consent                            was obtained. Prior Anticoagulants: The patient has                            taken Xarelto (rivaroxaban), last dose was 2 days                            prior to procedure. ASA Grade Assessment: II - A                            patient with mild systemic disease. After reviewing                            the risks and benefits, the patient was deemed in                            satisfactory condition to undergo the procedure.                           After obtaining informed consent, the endoscope was                            passed under direct vision. Throughout the                            procedure, the patient's blood pressure, pulse, and  oxygen saturations were monitored continuously. The                            GIF-H190 ([id number] Olympus gastroscope was                            introduced through the mouth, and advanced to the                            second part of duodenum. The upper GI endoscopy was                            accomplished without difficulty. The patient                            tolerated the procedure well. Scope In: Scope Out: Findings:      The examined esophagus was normal. / No varices.      Diffuse moderately erythematous mucosa was found in the stomach.      Patchy moderately erythematous mucosa was found in the duodenal bulb.      No active bleeding Impression:               - Normal esophagus.                           - Erythematous mucosa in the stomach.                           - Erythematous duodenopathy.                           - No specimens collected. Recommendation:           - Resume regular diet.                           - Continue  present medications.                           - Recommend acid suppression medication and avoid                            alcohol. Procedure Code(s):        --- Professional ---                           [phone], Esophagogastroduodenoscopy, flexible,                            transoral; diagnostic, including collection of                            specimen(s) by brushing or washing, when performed                            (separate procedure) Diagnosis Code(s):        --- Professional ---  K31.89, Other diseases of stomach and duodenum                           K92.0, Hematemesis                           K92.1, Melena (includes Hematochezia) CPT copyright 2019 American Medical Association. All rights reserved. The codes documented in this report are preliminary and upon coder review may  be revised to meet current compliance requirements. [name], MD 11/01/2019 10:11:00 AM This report has been signed electronically. Number of Addenda: 0

## 2019-11-02 LAB — CBC WITH DIFFERENTIAL/PLATELET
Abs Immature Granulocytes: 0.01 10*3/uL (ref 0.00–0.07)
Basophils Absolute: 0 10*3/uL (ref 0.0–0.1)
Basophils Relative: 1 %
Eosinophils Absolute: 0.3 10*3/uL (ref 0.0–0.5)
Eosinophils Relative: 8 %
HCT: 33.5 % — ABNORMAL LOW (ref 39.0–52.0)
Hemoglobin: 11 g/dL — ABNORMAL LOW (ref 13.0–17.0)
Immature Granulocytes: 0 %
Lymphocytes Relative: 28 %
Lymphs Abs: 1.1 10*3/uL (ref 0.7–4.0)
MCH: 29.3 pg (ref 26.0–34.0)
MCHC: 32.8 g/dL (ref 30.0–36.0)
MCV: 89.3 fL (ref 80.0–100.0)
Monocytes Absolute: 0.5 10*3/uL (ref 0.1–1.0)
Monocytes Relative: 14 %
Neutro Abs: 1.9 10*3/uL (ref 1.7–7.7)
Neutrophils Relative %: 49 %
Platelets: 137 10*3/uL — ABNORMAL LOW (ref 150–400)
RBC: 3.75 MIL/uL — ABNORMAL LOW (ref 4.22–5.81)
RDW: 12.8 % (ref 11.5–15.5)
WBC: 3.8 10*3/uL — ABNORMAL LOW (ref 4.0–10.5)
nRBC: 0 % (ref 0.0–0.2)

## 2019-11-02 LAB — COMPREHENSIVE METABOLIC PANEL
ALT: 51 U/L — ABNORMAL HIGH (ref 0–44)
AST: 47 U/L — ABNORMAL HIGH (ref 15–41)
Albumin: 3.1 g/dL — ABNORMAL LOW (ref 3.5–5.0)
Alkaline Phosphatase: 61 U/L (ref 38–126)
Anion gap: 7 (ref 5–15)
BUN: 17 mg/dL (ref 6–20)
CO2: 23 mmol/L (ref 22–32)
Calcium: 8 mg/dL — ABNORMAL LOW (ref 8.9–10.3)
Chloride: 105 mmol/L (ref 98–111)
Creatinine, Ser: 1.33 mg/dL — ABNORMAL HIGH (ref 0.61–1.24)
GFR calc Af Amer: >60 mL/min (ref >60)
GFR calc non Af Amer: >60 mL/min (ref >60)
Glucose, Bld: 122 mg/dL — ABNORMAL HIGH (ref 70–99)
Potassium: 4 mmol/L (ref 3.5–5.1)
Sodium: 135 mmol/L (ref 135–145)
Total Bilirubin: 1 mg/dL (ref 0.3–1.2)
Total Protein: 5.5 g/dL — ABNORMAL LOW (ref 6.5–8.1)

## 2019-11-02 LAB — BRAIN NATRIURETIC PEPTIDE: B Natriuretic Peptide: 1196.8 pg/mL — ABNORMAL HIGH (ref 0.0–100.0)

## 2019-11-02 LAB — MAGNESIUM: Magnesium: 1.6 mg/dL — ABNORMAL LOW (ref 1.7–2.4)

## 2019-11-02 MED ORDER — RIVAROXABAN 20 MG PO TABS: 20.0000 mg | ORAL_TABLET | Freq: Every day | ORAL | 0 refills | Status: DC

## 2019-11-02 MED ORDER — MAGNESIUM SULFATE 2 GM/50ML IV SOLN
2.0000 g | Freq: Once | INTRAVENOUS | Status: AC
  Administered 2019-11-02: 2 g via INTRAVENOUS
  Filled 2019-11-02: qty 50

## 2019-11-02 MED ORDER — PANTOPRAZOLE SODIUM 40 MG PO TBEC: 40.0000 mg | DELAYED_RELEASE_TABLET | Freq: Two times a day (BID) | ORAL | 0 refills | Status: DC

## 2019-11-02 MED ORDER — FUROSEMIDE 40 MG PO TABS: 40.0000 mg | ORAL_TABLET | Freq: Every day | ORAL | 0 refills | Status: DC

## 2019-11-02 MED ORDER — RIVAROXABAN 20 MG PO TABS
20.0000 mg | ORAL_TABLET | Freq: Every day | ORAL | Status: DC
  Administered 2019-11-02: 20 mg via ORAL
  Filled 2019-11-02: qty 1

## 2019-11-02 MED ORDER — THIAMINE HCL 100 MG PO TABS: 100.0000 mg | ORAL_TABLET | Freq: Every day | ORAL | 0 refills | Status: DC

## 2019-11-02 NOTE — Progress Notes (Signed)
Discharge instructions given to patient. IV removed, clean and intact. Medications reviewed. All questions answered. Pt escorted home with mother.  Arletta Bale, RN

## 2019-11-02 NOTE — Progress Notes (Signed)
ANTICOAGULATION CONSULT NOTE - Initial Consult  Pharmacy Consult for Xarelto Indication: Hx PE  Allergies  Allergen Reactions  . Bactrim [Sulfamethoxazole-Trimethoprim] Rash    Patient Measurements: Height: 5\' 10"  (177.8 cm) Weight: 83 kg (182 lb 15.7 oz) IBW/kg (Calculated) : 73  Vital Signs: Temp: 98 F (36.7 C) (06/22 0407) Temp Source: Oral (06/22 0407) BP: 101/68 (06/22 0407) Pulse Rate: 85 (06/22 0407)  Labs: Recent Labs    10/30/19 1757 10/30/19 1757 10/30/19 2015 10/31/19 0401 10/31/19 0802 10/31/19 1623 10/31/19 1623 11/01/19 0055 11/02/19 0403  HGB 13.9   < >  --  13.9   < > 12.1*   < > 11.6* 11.0*  HCT 41.3   < >  --  41.2   < > 37.1*  --  35.4* 33.5*  PLT 274   < >  --  241   < > 193  --  181 137*  LABPROT  --   --  35.9* 28.4*  --   --   --   --   --   INR  --   --  3.8* 2.8*  --   --   --   --   --   CREATININE 1.56*   < >  --  1.54*  --   --   --  1.43* 1.33*  TROPONINIHS 32*  --  31*  --   --   --   --   --   --    < > = values in this interval not displayed.    Estimated Creatinine Clearance: 77.8 mL/min (A) (by C-G formula based on SCr of 1.33 mg/dL (H)).   Medical History: Past Medical History:  Diagnosis Date  . Alcohol use   . Asthma   . CHF (congestive heart failure) (HCC)     Assessment: 9 YOM with hx of LLL PE in May started on Xarelto for treatment, held this admission for possible GI bleed and now stable for restart.  CBC low but stable.    Goal of Therapy:  Monitor platelets by anticoagulation protocol: Yes   Plan:  Xarelto 20mg  PO daily Monitor renal function, s/s bleeding  Bertis Ruddy, PharmD Clinical Pharmacist Please check AMION for all [place] numbers 11/02/2019 7:08 AM

## 2019-11-02 NOTE — Discharge Instructions (Signed)
Follow with Primary MD  in 7 days   Get CBC, CMP, Magnesium, 2 view Chest X ray -  checked next visit within 1 week by Primary MD    Activity: As tolerated with Full fall precautions use walker/cane & assistance as needed  Disposition Home    Diet: Heart Healthy with 1.5 L fluid restriction per day.  Special Instructions: If you have smoked or chewed Tobacco  in the last 2 yrs please stop smoking, stop any regular Alcohol  and or any Recreational drug use.  On your next visit with your primary care physician please Get Medicines reviewed and adjusted.  Please request your Prim.MD to go over all Hospital Tests and Procedure/Radiological results at the follow up, please get all Hospital records sent to your Prim MD by signing hospital release before you go home.  If you experience worsening of your admission symptoms, develop shortness of breath, life threatening emergency, suicidal or homicidal thoughts you must seek medical attention immediately by calling 911 or calling your MD immediately  if symptoms less severe.  You Must read complete instructions/literature along with all the possible adverse reactions/side effects for all the Medicines you take and that have been prescribed to you. Take any new Medicines after you have completely understood and accpet all the possible adverse reactions/side effects.   Do not drive, operate heavy machinery, perform activities at heights, swimming or participation in water activities or provide baby sitting services if your were admitted for syncope or siezures until you have seen by Primary MD or a Neurologist and advised to do so again.  Do not drive when taking Pain medications.  Do not take more than prescribed Pain, Sleep and Anxiety Medications  Wear Seat belts while driving.   Information on my medicine - XARELTO (rivaroxaban)   WHY WAS XARELTO PRESCRIBED FOR YOU? Xarelto was prescribed to treat blood clots that may have been found in  the veins of your legs (deep vein thrombosis) or in your lungs (pulmonary embolism) and to reduce the risk of them occurring again.  What do you need to know about Xarelto? Take one 20 mg tablet taken ONCE A DAY with your evening meal.  DO NOT stop taking Xarelto without talking to the health care provider who prescribed the medication.  Refill your prescription for 20 mg tablets before you run out.  After discharge, you should have regular check-up appointments with your healthcare provider that is prescribing your Xarelto.  In the future your dose may need to be changed if your kidney function changes by a significant amount.  What do you do if you miss a dose? If you are taking Xarelto TWICE DAILY and you miss a dose, take it as soon as you remember. You may take two 15 mg tablets (total 30 mg) at the same time then resume your regularly scheduled 15 mg twice daily the next day.  If you are taking Xarelto ONCE DAILY and you miss a dose, take it as soon as you remember on the same day then continue your regularly scheduled once daily regimen the next day. Do not take two doses of Xarelto at the same time.   Important Safety Information Xarelto is a blood thinner medicine that can cause bleeding. You should call your healthcare provider right away if you experience any of the following: ? Bleeding from an injury or your nose that does not stop. ? Unusual colored urine (red or dark brown) or unusual colored stools (red or  black). ? Unusual bruising for unknown reasons. ? A serious fall or if you hit your head (even if there is no bleeding).  Some medicines may interact with Xarelto and might increase your risk of bleeding while on Xarelto. To help avoid this, consult your healthcare provider or pharmacist prior to using any new prescription or non-prescription medications, including herbals, vitamins, non-steroidal anti-inflammatory drugs (NSAIDs) and supplements.  This website has  more information on Xarelto: [email or web address]

## 2019-11-02 NOTE — Discharge Summary (Signed)
Robert [name] KDT:[id number] DOB: 09/22/1980 DOA: 10/30/2019  PCP: Patient, No Pcp Per  Admit date: 10/30/2019  Discharge date: 11/02/2019  Admitted From: Home   Disposition:  Home   Recommendations for Outpatient Follow-up:   Follow up with PCP in 1-2 weeks  PCP Please obtain BMP/CBC, 2 view CXR in 1week,  (see Discharge instructions)   PCP Please follow up on the following pending results: Monitor CBC, CMP, needs outpatient cardiology and GI follow-up.   Home Health: None Equipment/Devices: None Consultations: GI Discharge Condition: Stable    CODE STATUS: Full    Diet Recommendation: Heart Healthy with 1.5 L fluid restriction per day.    Chief Complaint  Patient presents with  . Chest Pain     Brief history of present illness from the day of admission and additional interim summary     Robert [name] a 39 y.o.malewith medical history significant forchronic systolic CHF with severe biventricular failure (EF 15%), LLL PE on Xarelto, pericardial effusion, alcohol use disorder, substance use disorder, tobacco use who presents to the ED for evaluation of nausea, vomiting, and dark stools.                                                                 Hospital Course    1.  Mild acute blood loss related anemia due to ongoing most likely upper GI bleed caused from gastritis in the setting of Xarelto use.  This likely has been brought on by excessive alcohol intake, he was treated with bowel rest, IV fluids and IV PPI, he was seen by GI underwent EGD showing no active bleeding but evidence of some gastritis.  He has been switched to oral PPI twice daily, H&H remained stable and he did not require any transfusions.  He will commence his Xarelto.  Strictly counseled to quit alcohol.  He is now  symptom-free and will be discharged home with outpatient PCP and GI follow-up.     2.  Ongoing alcohol abuse, history of cocaine abuse.  Strictly counseled in terms of his mother, DTs have resolved, will be discharged on folic acid and thiamine.  3.  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  Recently had a thorough work-up by cardiology few weeks ago, currently compensated, discharged on essentially previously prescribed medications by cardiologist 2 weeks ago, no beta-blocker as suggested by cardiology team few weeks ago due to low output state.  Request PCP to arrange for outpatient cardiology follow-up.  4.  AKI.  With history of CKD stage III.  Currently creatinine back to baseline.   Discharge diagnosis     Principal Problem:   AKI (acute kidney injury) ([facility]) Active Problems:   Chronic systolic CHF (  congestive heart failure) ([facility])   Pulmonary embolism ([facility])   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HCC)   Gastritis   Elevated INR    Discharge instructions    Discharge Instructions    Discharge instructions   Complete by: As directed    Follow with Primary MD  in 7 days   Get CBC, CMP, Magnesium, 2 view Chest X ray -  checked next visit within 1 week by Primary MD    Activity: As tolerated with Full fall precautions use walker/cane & assistance as needed  Disposition Home    Diet: Heart Healthy with 1.5 L fluid restriction per day.  Special Instructions: If you have smoked or chewed Tobacco  in the last 2 yrs please stop smoking, stop any regular Alcohol  and or any Recreational drug use.  On your next visit with your primary care physician please Get Medicines reviewed and adjusted.  Please request your Prim.MD to go over all Hospital Tests and Procedure/Radiological results at the follow up, please get all  Hospital records sent to your Prim MD by signing hospital release before you go home.  If you experience worsening of your admission symptoms, develop shortness of breath, life threatening emergency, suicidal or homicidal thoughts you must seek medical attention immediately by calling 911 or calling your MD immediately  if symptoms less severe.  You Must read complete instructions/literature along with all the possible adverse reactions/side effects for all the Medicines you take and that have been prescribed to you. Take any new Medicines after you have completely understood and accpet all the possible adverse reactions/side effects.   Do not drive, operate heavy machinery, perform activities at heights, swimming or participation in water activities or provide baby sitting services if your were admitted for syncope or siezures until you have seen by Primary MD or a Neurologist and advised to do so again.  Do not drive when taking Pain medications.  Do not take more than prescribed Pain, Sleep and Anxiety Medications  Wear Seat belts while driving.   Increase activity slowly   Complete by: As directed       Discharge Medications   Allergies as of 11/02/2019      Reactions   Bactrim [sulfamethoxazole-trimethoprim] Rash      Medication List    TAKE these medications   digoxin 0.125 MG tablet Commonly known as: LANOXIN Take 1 tablet (0.125 mg total) by mouth daily.   folic acid 1 MG tablet Commonly known as: FOLVITE Take 1 tablet (1 mg total) by mouth daily.   furosemide 40 MG tablet Commonly known as: LASIX Take 1 tablet (40 mg total) by mouth daily. What changed:   medication strength  how much to take   ivabradine 5 MG Tabs tablet Commonly known as: CORLANOR Take 1 tablet (5 mg total) by mouth 2 (two) times daily with a meal.   losartan 25 MG tablet Commonly known as: COZAAR Take 0.5 tablets (12.5 mg total) by mouth 2 (two) times daily.   Magnesium Oxide 200 MG  Tabs Take 1 tablet (200 mg total) by mouth 2 (two) times daily.   pantoprazole 40 MG tablet Commonly known as: Protonix Take 1 tablet (40 mg total) by mouth 2 (two) times daily.   rivaroxaban 20 MG Tabs tablet Commonly known as: XARELTO Take 1 tablet (20 mg total) by mouth daily. Start taking on: November 03, 2019 What changed: Another medication with the same name was removed. Continue taking this medication,  and follow the directions you see here.   thiamine 100 MG tablet Take 1 tablet (100 mg total) by mouth daily. Start taking on: November 03, 2019        Follow-up Information    [facility]. Schedule an appointment as soon as possible for a visit in 1 week(s).   Contact information: [place] [address] [phone]       Jerline Pain, MD. Schedule an appointment as soon as possible for a visit in 1 week(s).   Specialty: Cardiology Why: CHF Contact information: [address] [place] [address] [place] [address] [phone]               Major procedures and Radiology Reports - PLEASE review detailed and final reports thoroughly  -       DG Chest 2 View  Result Date: 10/30/2019 CLINICAL DATA:  Chest pain, shortness of breath. History of congestive heart failure. EXAM: CHEST - 2 VIEW COMPARISON:  Chest radiograph dated 10/24/2019 FINDINGS: The cardiac silhouette is enlarged and there is suggestion of a pericardial effusion. Both lungs are clear. The visualized skeletal structures are unremarkable. IMPRESSION: Enlarged cardiac silhouette with suggestion of a pericardial effusion. Clear lungs. Electronically Signed   By: Zerita Boers M.D.   On: 10/30/2019 18:39   DG Chest 2 View  Result Date: 10/24/2019 CLINICAL DATA:  Chest pain EXAM: CHEST - 2 VIEW COMPARISON:  10/04/2019 FINDINGS: Cardiomegaly. Lungs clear. No effusions or edema. No acute bony abnormality. IMPRESSION: Cardiomegaly.  No active  disease. Electronically Signed   By: Rolm Baptise M.D.   On: 10/24/2019 23:05   DG Chest 2 View  Result Date: 10/04/2019 CLINICAL DATA:  Shortness of breath with intermittent chest pain EXAM: CHEST - 2 VIEW COMPARISON:  August 03, 2018 FINDINGS: There is cardiomegaly with pulmonary venous hypertension. There is mild atelectatic change in each mid lung. There is no frank edema or airspace opacity. No pleural effusions evident. No adenopathy. No bone lesions. IMPRESSION: Cardiomegaly with pulmonary vascular congestion. Mild midlung atelectatic change. Lungs otherwise clear. No evident adenopathy. Electronically Signed   By: Lowella Grip III M.D.   On: 10/04/2019 10:21   CT ANGIO CHEST PE W OR WO CONTRAST  Result Date: 10/04/2019 CLINICAL DATA:  Intermittent chest pain and palpitations for the past week. EXAM: CT ANGIOGRAPHY CHEST WITH CONTRAST TECHNIQUE: Multidetector CT imaging of the chest was performed using the standard protocol during bolus administration of intravenous contrast. Multiplanar CT image reconstructions and MIPs were obtained to evaluate the vascular anatomy. CONTRAST:  122mL OMNIPAQUE IOHEXOL 350 MG/ML SOLN COMPARISON:  Chest x-ray from same day. FINDINGS: Cardiovascular: Satisfactory opacification of the pulmonary arteries to the segmental level. Acute nonocclusive lobar and segmental pulmonary emboli in the left lower lobe. No additional pulmonary emboli. Moderate cardiomegaly. Elevated RV/LV ratio measuring 1.1. Small to moderate pericardial effusion. No thoracic aortic aneurysm. Reflux of contrast into the hepatic veins. Mediastinum/Nodes: Prominent subcentimeter mediastinal and hilar lymph nodes are likely reactive. No enlarged axillary lymph nodes. The thyroid gland, trachea, and esophagus demonstrate no significant findings. Lungs/Pleura: Small right and trace left pleural effusions. Scattered mild interlobular septal thickening. Subsegmental atelectasis in both lower lobes with  more focal patchy opacity and small pneumatocele in the medial aspect of the left lower lobe. No pneumothorax. Upper Abdomen: No acute abnormality. Musculoskeletal: No chest wall abnormality. No acute or significant osseous findings. Review of the MIP images confirms the above  findings. IMPRESSION: 1. Acute nonocclusive lobar and segmental pulmonary emboli in the left lower lobe. Positive for acute PE with CT evidence of right heart strain (RV/LV Ratio = 1.1) consistent with at least submassive (intermediate risk) PE. The presence of right heart strain has been associated with an increased risk of morbidity and mortality. 2. Moderate cardiomegaly with small to moderate pericardial effusion, small right and trace left pleural effusions, and mild interstitial pulmonary edema, consistent with congestive heart failure. 3. Subsegmental atelectasis in both lower lobes with more focal patchy opacity and small pneumatocele in the medial aspect of the left lower lobe that may be postinfectious or postinflammatory scarring. Given the patient's smoking history, follow-up chest CT in 3 months is recommended. Critical Value/emergent results were called by telephone at the time of interpretation on 10/04/2019 at 4:08 pm to provider [facility] , who verbally acknowledged these results. Electronically Signed   By: Titus Dubin M.D.   On: 10/04/2019 16:12   CARDIAC CATHETERIZATION  Result Date: 10/06/2019 Moderate elevation of right heart pressures. Moderate pulmonary venous hypertension with a mean PA pressure at 37 mmHg; PVR 2.7 WU Normal coronary arteries with a dominant RCA. RECOMMENDATION: Guideline directed medical therapy for severe LV dysfunction.  To initiate heparin therapy following the procedure later today with plans to transition to oral anticoagulation with his pulmonary embolism tomorrow.  MR CARDIAC MORPHOLOGY W WO CONTRAST  Result Date: 10/08/2019 CLINICAL DATA:  39 year old male with h/o tobacco,  etoh and cocaine abuse, admitted with new onset CHF and pulmonary embolism. EXAM: CARDIAC MRI TECHNIQUE: The patient was scanned on a 1.5 Tesla GE magnet. A dedicated cardiac coil was used. Functional imaging was done using Fiesta sequences. 2,3, and 4 chamber views were done to assess for RWMA's. Modified Simpson's rule using a short axis stack was used to calculate an ejection fraction on a dedicated work Conservation officer, nature. The patient received 8 cc of Gadavist. After 10 minutes inversion recovery sequences were used to assess for infiltration and scar tissue. CONTRAST:  8 cc  of Gadavist FINDINGS: 1. Severely dilated left ventricle with normal wall thickness and severely decreased systolic function (LVEF = 14%). There is severe global hypokinesis and no late gadolinium enhancement in the left ventricular myocardium. There are pronounced trabeculations in the left ventricular mid and apical segments with non-compacted to compacted myocardial ratio 4:1. LVEDD: 74 mm LVESD: 69 mm LVEDV: 388 ml LVESV: 335 ml SV: 53 ml Myocardial mass: 158g 2. Moderately dilated right ventricle with normal wall thickness and severely decreased systolic function (RVEF = 16%). There are no regional wall motion abnormalities. 3.  Severely dilated left atrium and moderately right atrium. 4. Normal size of the aortic root, ascending aorta. Moderately dilated pulmonary artery measuring 35 mm. 5.  Mild mitral and tricuspid regurgitation. 6. Normal pericardium. Moderate circumferential pericardial effusion wit maximum diameter 17 mm around the inferior wall. IVC diameter 22 mm. IMPRESSION: 1. Severely dilated left ventricle with normal wall thickness and severely decreased systolic function (LVEF = 14%). There is severe global hypokinesis and no late gadolinium enhancement in the left ventricular myocardium. There are pronounced trabeculations in the left ventricular mid and apical segments with non-compacted to compacted  myocardial ratio 4:1. 2. Moderately dilated right ventricle with normal wall thickness and severely decreased systolic function (RVEF = 16%). There are no regional wall motion abnormalities. 3.  Severely dilated left atrium and moderately right atrium. 4. Normal size of the aortic root, ascending aorta. Moderately dilated  pulmonary artery measuring 35 mm. 5.  Mild mitral and tricuspid regurgitation. 6. Normal pericardium. Moderate circumferential pericardial effusion with maximum diameter 19 mm around the inferior wall. IVC diameter 22 mm with no collapse with respiratory variations. There is mild right atrial invagination. No definite signs of tamponade. 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 Electronically Signed   By: Ena Dawley   On: 10/07/2019 17:11   ECHOCARDIOGRAM COMPLETE  Result Date: 10/05/2019    ECHOCARDIOGRAM REPORT   Patient Name:   OLIVERIO Geyer Date of Exam: 10/05/2019 Medical Rec #:  [id number]    Height:       70.0 in Accession #:    [id number]   Weight:       180.0 lb Date of Birth:  December 03, 1980     BSA:          1.996 m Patient Age:    46 years     BP:           110/81 mmHg Patient Gender: M            HR:           113 bpm. Exam Location:  Inpatient Procedure: 2D Echo, Cardiac Doppler and Color Doppler Indications:    Dyspnea  History:        Patient has no prior history of Echocardiogram examinations.                 Arrythmias:sinus tachycardia, Signs/Symptoms:Dyspnea and Chest                 Pain; Risk Factors:Current Smoker. Pulmonary emboli.  Sonographer:    Dustin Flock Referring Phys: [phone] TIFFANY [place] IMPRESSIONS  1. Left ventricular ejection fraction, by estimation, is <20%. The left ventricle has severely decreased function. The left ventricle demonstrates global hypokinesis. The left ventricular internal cavity size was moderately to severely  dilated. Prominent trabeculation, consider LV noncompaction (could assess more closely with cardiac MRI). Left ventricular diastolic parameters are consistent with Grade III diastolic dysfunction (restrictive). Elevated left ventricular end-diastolic pressure.  2. Right ventricular systolic function is moderately reduced. The right ventricular size is mildly enlarged. There is mildly elevated pulmonary artery systolic pressure. The estimated right ventricular systolic pressure is 30.8 mmHg.  3. Left atrial size was moderately dilated.  4. Right atrial size was mildly dilated.  5. The mitral valve is normal in structure. Mild mitral valve regurgitation. No evidence of mitral stenosis.  6. The aortic valve is tricuspid. Aortic valve regurgitation is not visualized. No aortic stenosis is present.  7. The inferior vena cava is dilated in size with <50% respiratory variability, suggesting right atrial pressure of 15 mmHg. FINDINGS  Left Ventricle: Left ventricular ejection fraction, by estimation, is <20%. The left ventricle has severely decreased function. The left ventricle demonstrates global hypokinesis. The left ventricular internal cavity size was moderately to severely dilated. There is no left ventricular hypertrophy. Left ventricular diastolic parameters are consistent with Grade III diastolic dysfunction (restrictive). Elevated left ventricular end-diastolic pressure. Right Ventricle: The right ventricular size is mildly enlarged. No increase in right ventricular wall thickness. Right ventricular systolic function is moderately reduced. There is mildly elevated pulmonary artery systolic pressure. The tricuspid regurgitant velocity is 2.41 m/s, and with an assumed right atrial pressure of 15 mmHg, the estimated right ventricular systolic pressure is 65.7 mmHg. Left Atrium: Left atrial size was moderately dilated. Right Atrium: Right atrial size  was mildly dilated. Prominent Chiari network. Pericardium: A small  pericardial effusion is present. There is no evidence of cardiac tamponade. Mitral Valve: The mitral valve is normal in structure. Mild mitral valve regurgitation. No evidence of mitral valve stenosis. Tricuspid Valve: The tricuspid valve is normal in structure. Tricuspid valve regurgitation is trivial. Aortic Valve: The aortic valve is tricuspid. Aortic valve regurgitation is not visualized. No aortic stenosis is present. Pulmonic Valve: The pulmonic valve was normal in structure. Pulmonic valve regurgitation is trivial. Aorta: The aortic root is normal in size and structure. Venous: The inferior vena cava is dilated in size with less than 50% respiratory variability, suggesting right atrial pressure of 15 mmHg. IAS/Shunts: No atrial level shunt detected by color flow Doppler.  LEFT VENTRICLE PLAX 2D LVIDd:         7.00 cm  Diastology LVIDs:         6.30 cm  LV e' lateral:   4.90 cm/s LV PW:         1.00 cm  LV E/e' lateral: 19.1 LV IVS:        1.00 cm  LV e' medial:    3.56 cm/s LVOT diam:     2.70 cm  LV E/e' medial:  26.2 LV SV:         51 LV SV Index:   25 LVOT Area:     5.73 cm  RIGHT VENTRICLE RV Basal diam:  2.50 cm RV S prime:     7.29 cm/s TAPSE (M-mode): 2.4 cm LEFT ATRIUM              Index       RIGHT ATRIUM           Index LA diam:        4.90 cm  2.46 cm/m  RA Area:     22.90 cm LA Vol (A2C):   71.0 ml  35.57 ml/m RA Volume:   69.10 ml  34.62 ml/m LA Vol (A4C):   109.0 ml 54.61 ml/m LA Biplane Vol: 96.8 ml  48.50 ml/m  AORTIC VALVE LVOT Vmax:   64.60 cm/s LVOT Vmean:  43.500 cm/s LVOT VTI:    0.088 m  AORTA Ao Root diam: 3.10 cm MITRAL VALVE               TRICUSPID VALVE MV Area (PHT): 7.16 cm    TR Peak grad:   23.2 mmHg MV Decel Time: 106 msec    TR Vmax:        241.00 cm/s MV E velocity: 93.40 cm/s MV A velocity: 30.00 cm/s  SHUNTS MV E/A ratio:  3.11        Systemic VTI:  0.09 m                            Systemic Diam: 2.70 cm Loralie Champagne MD Electronically signed by Loralie Champagne MD  Signature Date/Time: 10/05/2019/10:21:58 AM    Final    VAS [place] LOWER EXTREMITY VENOUS (DVT)  Result Date: 10/05/2019  Lower Venous DVTStudy Indications: Pulmonary embolism, and CHF.  Comparison Study: none Performing Technologist: June Leap RDMS, RVT  Examination Guidelines: A complete evaluation includes B-mode imaging, spectral Doppler, color Doppler, and power Doppler as needed of all accessible portions of each vessel. Bilateral testing is considered an integral part of a complete examination. Limited examinations for reoccurring indications may be performed as noted. The reflux portion of the exam is  performed with the patient in reverse Trendelenburg.  +---------+---------------+---------+-----------+----------+--------------+ RIGHT    CompressibilityPhasicitySpontaneityPropertiesThrombus Aging +---------+---------------+---------+-----------+----------+--------------+ CFV      Full           Yes      Yes                                 +---------+---------------+---------+-----------+----------+--------------+ SFJ      Full                                                        +---------+---------------+---------+-----------+----------+--------------+ FV Prox  Full                                                        +---------+---------------+---------+-----------+----------+--------------+ FV Mid   Full                                                        +---------+---------------+---------+-----------+----------+--------------+ FV DistalFull                                                        +---------+---------------+---------+-----------+----------+--------------+ PFV      Full                                                        +---------+---------------+---------+-----------+----------+--------------+ POP      Full           Yes      Yes                                  +---------+---------------+---------+-----------+----------+--------------+ PTV      Full                                                        +---------+---------------+---------+-----------+----------+--------------+ PERO     Full                                                        +---------+---------------+---------+-----------+----------+--------------+   +---------+---------------+---------+-----------+----------+--------------+ LEFT     CompressibilityPhasicitySpontaneityPropertiesThrombus Aging +---------+---------------+---------+-----------+----------+--------------+ CFV      Full           Yes      Yes                                 +---------+---------------+---------+-----------+----------+--------------+  SFJ      Full                                                        +---------+---------------+---------+-----------+----------+--------------+ FV Prox  Full                                                        +---------+---------------+---------+-----------+----------+--------------+ FV Mid   Full                                                        +---------+---------------+---------+-----------+----------+--------------+ FV DistalFull                                                        +---------+---------------+---------+-----------+----------+--------------+ PFV      Full                                                        +---------+---------------+---------+-----------+----------+--------------+ POP      Full           Yes      Yes                                 +---------+---------------+---------+-----------+----------+--------------+ PTV      Full                                                        +---------+---------------+---------+-----------+----------+--------------+ PERO     Full                                                         +---------+---------------+---------+-----------+----------+--------------+     Summary: BILATERAL: - No evidence of deep vein thrombosis seen in the lower extremities, bilaterally. -No evidence of popliteal cyst, bilaterally.   *See table(s) above for measurements and observations. Electronically signed by Deitra Mayo MD on 10/05/2019 at 3:28:35 PM.    Final    ECHOCARDIOGRAM LIMITED  Result Date: 10/12/2019    ECHOCARDIOGRAM LIMITED REPORT   Patient Name:   EGAN BERKHEIMER Date of Exam: 10/12/2019 Medical Rec #:  [id number]    Height:       70.0 in Accession #:    [id number]   Weight:       178.1 lb Date of  Birth:  [birth year]     BSA:          1.987 m Patient Age:    60 years     BP:           93/73 mmHg Patient Gender: M            HR:           109 bpm. Exam Location:  Inpatient Procedure: Limited Echo, Color Doppler and Cardiac Doppler Indications:    pericardial effusion  History:        Patient has prior history of Echocardiogram examinations, most                 recent 10/05/2019. CHF; Risk Factors:Current Smoker.  Sonographer:    Johny Chess Referring Phys: [place]  1. Left ventricular ejection fraction, by estimation, is <20%. The left ventricle has severely decreased function. The left ventricle demonstrates global hypokinesis. The left ventricular internal cavity size was moderately to severely dilated. Left ventricular diastolic function could not be evaluated.  2. Moderate pericardial effusion. There is no evidence of cardiac tamponade.  3. The mitral valve is normal in structure. Mild mitral valve regurgitation.  4. The aortic valve is tricuspid. Aortic valve regurgitation is not visualized. No aortic stenosis is present.  5. There is normal pulmonary artery systolic pressure.  6. The inferior vena cava is normal in size with <50% respiratory variability, suggesting right atrial pressure of 8 mmHg. Conclusion(s)/Recommendation(s): Pericardial effusion stable to  slightly smaller. Rest of echo without significant change from prior. FINDINGS  Left Ventricle: Left ventricular ejection fraction, by estimation, is <20%. The left ventricle has severely decreased function. The left ventricle demonstrates global hypokinesis. The left ventricular internal cavity size was moderately to severely dilated. Right Ventricle: There is normal pulmonary artery systolic pressure. The tricuspid regurgitant velocity is 2.62 m/s, and with an assumed right atrial pressure of 8 mmHg, the estimated right ventricular systolic pressure is 54.6 mmHg. Pericardium: A moderately sized pericardial effusion is present. There is no evidence of cardiac tamponade. Mitral Valve: The mitral valve is normal in structure. Mild mitral valve regurgitation. Tricuspid Valve: The tricuspid valve is normal in structure. Tricuspid valve regurgitation is mild. Aortic Valve: The aortic valve is tricuspid. Aortic valve regurgitation is not visualized. No aortic stenosis is present. Pulmonic Valve: The pulmonic valve was grossly normal. Pulmonic valve regurgitation is trivial. No evidence of pulmonic stenosis. Aorta: The aortic root and ascending aorta are structurally normal, with no evidence of dilitation. Venous: The inferior vena cava is normal in size with less than 50% respiratory variability, suggesting right atrial pressure of 8 mmHg.  LEFT VENTRICLE PLAX 2D LVIDd:         6.80 cm LVIDs:         6.32 cm LV PW:         0.92 cm LV IVS:        0.64 cm LVOT diam:     2.20 cm LVOT Area:     3.80 cm  IVC IVC diam: 1.89 cm LEFT ATRIUM         Index LA diam:    5.00 cm 2.52 cm/m  TRICUSPID VALVE TR Peak grad:   27.5 mmHg TR Vmax:        262.00 cm/s  SHUNTS Systemic Diam: 2.20 cm Buford Dresser MD Electronically signed by Buford Dresser MD Signature Date/Time: 10/12/2019/5:30:19 PM    Final    [place] EKG SITE  RITE  Result Date: 10/08/2019 If [facility] image not attached, placement could not be confirmed due to  current cardiac rhythm.   Micro Results     Recent Results (from the past 240 hour(s))  SARS Coronavirus 2 by RT PCR (hospital order, performed in [facility] hospital lab) Nasopharyngeal Nasopharyngeal Swab     Status: None   Collection Time: 10/30/19  8:24 PM   Specimen: Nasopharyngeal Swab  Result Value Ref Range Status   SARS Coronavirus 2 NEGATIVE NEGATIVE Final    Comment: (NOTE) SARS-CoV-2 target nucleic acids are NOT DETECTED.  The SARS-CoV-2 RNA is generally detectable in upper and lower respiratory specimens during the acute phase of infection. The lowest concentration of SARS-CoV-2 viral copies this assay can detect is 250 copies / mL. A negative result does not preclude SARS-CoV-2 infection and should not be used as the sole basis for treatment or other patient management decisions.  A negative result may occur with improper specimen collection / handling, submission of specimen other than nasopharyngeal swab, presence of viral mutation(s) within the areas targeted by this assay, and inadequate number of viral copies (<250 copies / mL). A negative result must be combined with clinical observations, patient history, and epidemiological information.  Fact Sheet for Patients:   StrictlyIdeas.no  Fact Sheet for Healthcare Providers: BankingDealers.co.za  This test is not yet approved or  cleared by the [place] FDA and has been authorized for detection and/or diagnosis of SARS-CoV-2 by FDA under an Emergency Use Authorization (EUA).  This EUA will remain in effect (meaning this test can be used) for the duration of the COVID-19 declaration under Section 564(b)(1) of the Act, 21 U.S.C. section 360bbb-3(b)(1), unless the authorization is terminated or revoked sooner.  Performed at [facility], [place] [address], [place], [place] [address]     Today   Subjective    Robert [name] today has no headache,no  chest abdominal pain,no new weakness tingling or numbness, feels much better wants to go home today.    Objective   Blood pressure 105/79, pulse 94, temperature 98.2 F (36.8 C), temperature source Oral, resp. rate 19, height 5\' 10"  (1.778 m), weight 83 kg, SpO2 100 %.   Intake/Output Summary (Last 24 hours) at 11/02/2019 0939 Last data filed at 11/02/2019 0900 Gross per 24 hour  Intake 2616.28 ml  Output 675 ml  Net 1941.28 ml    Exam  Awake Alert, No new F.N deficits, Normal affect [place].AT,PERRAL Supple Neck,No JVD, No cervical lymphadenopathy appriciated.  Symmetrical Chest wall movement, Good air movement bilaterally, CTAB RRR,No Gallops,Rubs or new Murmurs, No Parasternal Heave +ve B.Sounds, Abd Soft, Non tender, No organomegaly appriciated, No rebound -guarding or rigidity. No Cyanosis, Clubbing or edema, No new Rash or bruise   Data Review   CBC w Diff:  Lab Results  Component Value Date   WBC 3.8 (L) 11/02/2019   HGB 11.0 (L) 11/02/2019   HCT 33.5 (L) 11/02/2019   PLT 137 (L) 11/02/2019   LYMPHOPCT 28 11/02/2019   MONOPCT 14 11/02/2019   EOSPCT 8 11/02/2019   BASOPCT 1 11/02/2019    CMP:  Lab Results  Component Value Date   NA 135 11/02/2019   K 4.0 11/02/2019   CL 105 11/02/2019   CO2 23 11/02/2019   BUN 17 11/02/2019   CREATININE 1.33 (H) 11/02/2019   PROT 5.5 (L) 11/02/2019   ALBUMIN 3.1 (L) 11/02/2019   BILITOT 1.0 11/02/2019   ALKPHOS 61 11/02/2019  AST 47 (H) 11/02/2019   ALT 51 (H) 11/02/2019  .   Total Time in preparing paper work, data evaluation and todays exam - 86 minutes  Lala Lund M.D on 11/02/2019 at 9:39 AM  [facility]   Office  [phone]

## 2019-11-02 NOTE — Progress Notes (Signed)
                                      [facility]                            [place],  [address]      Robert [name] was admitted to the Hospital on 10/30/2019 and Discharged  11/02/2019 and should be excused from work/school   For 6  days starting from date -  10/30/2019 , may return to work/school without any restrictions.  Call Lala Lund MD, [facility]  [phone] with questions.  Lala Lund M.D on 11/02/2019,at 10:17 AM  [facility]   Office  [phone]

## 2019-11-03 ENCOUNTER — Ambulatory Visit (HOSPITAL_COMMUNITY)
Admission: RE | Admit: 2019-11-03 | Discharge: 2019-11-03 | Disposition: A | Discharge status: Home / Self Care | Payer: BC Managed Care – PPO | Source: Ambulatory Visit | Attending: Cardiology | Admitting: Cardiology

## 2019-11-03 ENCOUNTER — Encounter (HOSPITAL_COMMUNITY): Payer: Self-pay

## 2019-11-03 ENCOUNTER — Telehealth (HOSPITAL_COMMUNITY): Payer: Self-pay | Admitting: Pharmacy Technician

## 2019-11-03 ENCOUNTER — Other Ambulatory Visit: Payer: Self-pay

## 2019-11-03 VITALS — BP 112/74 | HR 91 | Wt 191.1 lb

## 2019-11-03 DIAGNOSIS — I5022 Chronic systolic (congestive) heart failure: Secondary | ICD-10-CM | POA: Diagnosis not present

## 2019-11-03 LAB — BASIC METABOLIC PANEL
Anion gap: 13 (ref 5–15)
BUN: 20 mg/dL (ref 6–20)
CO2: 23 mmol/L (ref 22–32)
Calcium: 8.9 mg/dL (ref 8.9–10.3)
Chloride: 102 mmol/L (ref 98–111)
Creatinine, Ser: 1.19 mg/dL (ref 0.61–1.24)
GFR calc Af Amer: >60 mL/min (ref >60)
GFR calc non Af Amer: >60 mL/min (ref >60)
Glucose, Bld: 105 mg/dL — ABNORMAL HIGH (ref 70–99)
Potassium: 4.1 mmol/L (ref 3.5–5.1)
Sodium: 138 mmol/L (ref 135–145)

## 2019-11-03 LAB — DIGOXIN LEVEL: Digoxin Level: 0.5 ng/mL — ABNORMAL LOW (ref 0.8–2.0)

## 2019-11-03 MED ORDER — FUROSEMIDE 40 MG PO TABS: 80.0000 mg | ORAL_TABLET | Freq: Every day | ORAL | 3 refills | Status: DC

## 2019-11-03 MED ORDER — SPIRONOLACTONE 25 MG PO TABS
12.5000 mg | ORAL_TABLET | Freq: Two times a day (BID) | ORAL | 3 refills | Status: DC
Start: 2019-11-03 — End: 2019-12-09

## 2019-11-03 NOTE — Telephone Encounter (Signed)
I saw Robert [name] in clinic today. He has lost his insurance through [place] and needs helps getting some of his medications. He is already approved to receive Corlanor from [place]. I provided him with [facility] phone number. I advised him to call and get a refill of his Corlanor.  Will now send off [employer] application for assistance with Xarelto. Will follow up with Robert [name] once [employer] makes a determination.

## 2019-11-03 NOTE — Progress Notes (Signed)
CSW referred to assist patient with insurance options and resources. Patient reports he was working at [employer] and due to recent illness has been unable to work. 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 He is hopeful to return to work but has been advised not to return at this time due to health reasons. Unfortunately, he reports that he does not have any short term disability income benefits but may have an insurance option. Patient will contact human resources and see if he can reinstate his insurance benefits and return call to [place]. CSW discussed some community resources and other insurance options. Patient appears to be a bit overwhelmed with all the medical information as this is a new diagnosis. CSW provided supportive intervention and will await return call from patient. Raquel Sarna, [place], [place]

## 2019-11-03 NOTE — Patient Instructions (Signed)
TAKE Spironolactone 12.5mg  twice daily  INCREASE Lasix to 80mg  daily.  Routine lab work today. Will notify you of abnormal results   Follow up next week.

## 2019-11-03 NOTE — Progress Notes (Signed)
ReDs Clip reading performed =46%

## 2019-11-03 NOTE — Progress Notes (Signed)
PCP: Primary Cardiologist: Dr Haroldine Laws    Reason for F/u: F/u for Chronic Systolic Heart Failure   HPI: 39 y/o male with h/o heavy ETOH and recent cocaine use, PE, recently diagnosed biventricular heart failure.   Admitted with acute onset HF with severe biventricular failure. EF 15%. RV down. He was drinking at least 2 beers every night and about 12-24 over the weekend. Cath shown normal coronaries. Elevated filling pressures with normal output. Placed on milrinone and diuresed with IV lasix. As he improved milrinone was weaned off . Transitioned oral lasix. cMRI shows Noncompaction. Significant daily ETOH intake. CT shows small LLL PE. Placed on xarelto. Started on HF meds.  Discharge weight 178 pounds.   Had initial post hospital f/u 6/14 and was volume overloaded. Given IV Lasix in clinic and home lasix increased to 80 mg daily.   Recently readmitted by [facility] and just discharged yesterday. Admitted for mild acute blood loss related anemia due to ongoing most likely upper GI bleed caused from gastritis in the setting of Xarelto use. This likely has been brought on by excessive alcohol intake. He was treated with bowel rest, IV fluids and IV PPI. He was seen by GI and underwent EGD showing no active bleeding but evidence of some gastritis.  He was switched to oral PPI twice daily, H&H remained stable and he did not require any transfusions.  He was instructed to continue Xarelto.   As noted above, he was just discharged yesterday. Prior to d/c, labs yesterday showed elevated BNP 1,196 (up on 859, the day prior).  SCr 1.33 c/w baseline. K 4.0. Hgb 11.  Pt reports that while on bowel rest, he was given IVFs and states that his PO lasix was reduced to 40 mg.   He is volume overloaded on exam but not highly symptomatic. ReDs clip elevated at 46%. Notes some fatigue. No resting dyspnea but dyspnea with moderate activity. Continues to smoke cigarettes. No ETOH since discharge yesterday. Denies further  cocaine use. No further melena.    ECHO 10/12/2019 Pericardial effusion remains moderate in size. No tamponade physiology. ECHO 10/05/2019 EF <20%  Prominent trabeculation. Grade III DD  [facility] 10/05/2019 showed normal coronaries, elevated filling pressures with normal output.  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  SH:  Social History   Socioeconomic History  . Marital status: Single    Spouse name: Not on file  . Number of children: Not on file  . Years of education: Not on file  . Highest education level: Not on file  Occupational History  . Not on file  Tobacco Use  . Smoking status: Current Every Day Smoker    Packs/day: 0.50    Types: Cigarettes  . Smokeless tobacco: Never Used  Vaping Use  . Vaping Use: Former  Substance and Sexual Activity  . Alcohol use: Yes  . Drug use: Yes    Types: Marijuana  . Sexual activity: Not on file  Other Topics Concern  . Not on file  Social History Narrative  . Not on file   Social Determinants of Health   Financial Resource Strain:   . Difficulty of Paying Living Expenses:   Food Insecurity:   . Worried About Charity fundraiser in the Last Year:   . Arboriculturist in the Last Year:   Transportation Needs:   . Lack  of Transportation (Medical):   [name] Lack of Transportation (Non-Medical):   Physical Activity:   . Days of Exercise per Week:   . Minutes of Exercise per Session:   Stress:   . Feeling of Stress :   Social Connections:   . Frequency of Communication with Friends and Family:   . Frequency of Social Gatherings with Friends and Family:   . Attends Religious Services:   . Active Member of Clubs or Organizations:   . Attends Archivist Meetings:   [name] Marital Status:   Intimate Partner Violence:   . Fear of Current or Ex-Partner:   . Emotionally Abused:   [name] Physically Abused:   . Sexually  Abused:     FH:  Family History  Problem Relation Age of Onset  . Heart failure Father   . Diabetes Father   . Kidney disease Father     Past Medical History:  Diagnosis Date  . Alcohol use   . Asthma   . CHF (congestive heart failure) (HCC)     Current Outpatient Medications  Medication Sig Dispense Refill  . digoxin (LANOXIN) 0.125 MG tablet Take 1 tablet (0.125 mg total) by mouth daily. 30 tablet 5  . folic acid (FOLVITE) 1 MG tablet Take 1 tablet (1 mg total) by mouth daily. 30 tablet 3  . furosemide (LASIX) 40 MG tablet Take 1 tablet (40 mg total) by mouth daily. 30 tablet 0  . ivabradine (CORLANOR) 5 MG TABS tablet Take 1 tablet (5 mg total) by mouth 2 (two) times daily with a meal. 60 tablet 5  . losartan (COZAAR) 25 MG tablet Take 0.5 tablets (12.5 mg total) by mouth 2 (two) times daily. 30 tablet 5  . Magnesium Oxide 200 MG TABS Take 1 tablet (200 mg total) by mouth 2 (two) times daily. 60 tablet 11  . pantoprazole (PROTONIX) 40 MG tablet Take 1 tablet (40 mg total) by mouth 2 (two) times daily. 60 tablet 0  . rivaroxaban (XARELTO) 20 MG TABS tablet Take 1 tablet (20 mg total) by mouth daily. 30 tablet 0  . thiamine 100 MG tablet Take 1 tablet (100 mg total) by mouth daily. 30 tablet 0   No current facility-administered medications for this encounter.    Vitals:   11/03/19 1032  BP: 112/74  Pulse: 91  SpO2: 99%  Weight: 86.7 kg   PHYSICAL EXAM: ReDs Clip 46% General:  Mildly fatigue appearing. No respiratory difficulty HEENT: normal Neck: supple. no JVD. Carotids 2+ bilat; no bruits. No lymphadenopathy or thyromegaly appreciated. Cor: PMI nondisplaced. Regular rate & rhythm. No rubs, gallops or murmurs. Lungs: clear Abdomen: soft, nontender, nondistended. No hepatosplenomegaly. No bruits or masses. Good bowel sounds. Extremities: no cyanosis, clubbing, rash, edema Neuro: alert & oriented x 3, cranial nerves grossly intact. moves all 4 extremities w/o  difficulty. Affect pleasant.    ECG: not performed    ASSESSMENT & PLAN: 1.Chronic Systolic Heart Failure, NICM  - ECHO 5/21 w/ severely reduced EF 15% RV moderately reduced. Suspect ETOH and cocaine playing a role.  - LHC with no coronary disease. 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 Will refer for genetic testing - NYHA III. Volume status elevated. Reds Clip 46%. BNP 1,196 - Increase Lasix back to 80 mg daily  - Increase Spiro to 12.5 mg bid  - Continue digoxin 0.125 mg daily . Check dig level   - Continue losartan  12.5 bid. BP too soft for Entresto currently  - Continue Corlanor 5 mg bid.  - consider SGLT2i at next visit - no  blocker yet t  - not a candidate for advanced therapies w/ polysubstance abuse  - discussed importance of abstinence from ETOH, cocaine and tobacco  - encouraged low sodium diet and daily wts (has scale at home) - Plan to repeat ECHO in 3 months after med optimization  2.Pulmonary Emboli - CTA w/ small LLL PE 5/21 - LE Venous dopplers negative for DVT  - Continue xarelto.    3. ETOH Abuse - Heavy drinker for many years.   - denies any further ETOH use since most recent hospitalization  - abstinence imperative. HFSW following   4. H/O Asthma  - stable   5. H/O Cocaine Abuse - No use since discharge.  - most recent UDS 10/31/19 negative   6. Pericardial Effusion - moderate circumferential effusion on cMRI. No definite signs of tamponade on study.  - Repeat ECHO in the 3 months   7. Tobacco Abuse - Discussed smoking cessation   8. Gastritis  - Recent gastritis as outlined above, related to ETOH + Xarelto. Hgb ~11 - advised to quit drinking  - continue bid Protonix for GI protection   9. Social - out of work due to medical. Needs short term disability  - currently uninsured. Has financial barriers that may affect compliance - HFSW consulted and will assist  w/ needs. Appreciative for their assistance   Close f/u w/ APP in 1 week   Kloey Cazarez PA-C  10:35 AM

## 2019-11-04 ENCOUNTER — Telehealth (HOSPITAL_COMMUNITY): Payer: Self-pay | Admitting: Licensed Clinical Social Worker

## 2019-11-04 ENCOUNTER — Encounter (HOSPITAL_COMMUNITY): Payer: Self-pay

## 2019-11-04 NOTE — Telephone Encounter (Signed)
CSW called out to front lobby by receptionist requesting assistance as a woman was inquiring about substance abuse resources.  CSW met with woman who reports she is the patients mom.  Did not discuss any specifics of patient situation as we have not received verbal permission to discuss his case with her but spoke to her generally about substance abuse resources in the area and provided with her with a resource list as well as my phone number to give to her son to discuss.  Pt mom very worried about pt- reports after leaving the hospital he got home and started drinking immediately- thinks this is due to depression and pt expresses he can't sleep without the alcohol.  Pt mom really hopeful that pt can go to inpatient rehab but states he has said he doesn't want to go when she has spoken to him about it.  States he went once before to [facility] and was there for about 3 months and that this helped him quit for awhile after- would like him to do something similar again.  Is worried about him continuing especially with his health conditions.  CSW attempted to reach out to pt to discuss as pt mom reports she is ok with CSW revealing she was inquiring about help- no answer- left VM requesting return call  Jorge Ny, [place] [facility] Desk#: [phone] Cell#: [phone]

## 2019-11-08 ENCOUNTER — Encounter (HOSPITAL_COMMUNITY): Payer: Self-pay | Admitting: Licensed Clinical Social Worker

## 2019-11-08 ENCOUNTER — Other Ambulatory Visit: Payer: Self-pay

## 2019-11-08 ENCOUNTER — Encounter (HOSPITAL_COMMUNITY): Payer: Self-pay

## 2019-11-08 ENCOUNTER — Ambulatory Visit (HOSPITAL_COMMUNITY)
Admission: RE | Admit: 2019-11-08 | Discharge: 2019-11-08 | Disposition: A | Discharge status: Home / Self Care | Payer: BC Managed Care – PPO | Source: Ambulatory Visit | Attending: Adult Health | Admitting: Adult Health

## 2019-11-08 VITALS — BP 94/60 | HR 65 | Ht 70.0 in | Wt 188.4 lb

## 2019-11-08 DIAGNOSIS — I2699 Other pulmonary embolism without acute cor pulmonale: Secondary | ICD-10-CM

## 2019-11-08 DIAGNOSIS — J45909 Unspecified asthma, uncomplicated: Secondary | ICD-10-CM | POA: Insufficient documentation

## 2019-11-08 DIAGNOSIS — Z86711 Personal history of pulmonary embolism: Secondary | ICD-10-CM | POA: Insufficient documentation

## 2019-11-08 DIAGNOSIS — Z79899 Other long term (current) drug therapy: Secondary | ICD-10-CM | POA: Diagnosis not present

## 2019-11-08 DIAGNOSIS — I5082 Biventricular heart failure: Secondary | ICD-10-CM | POA: Insufficient documentation

## 2019-11-08 DIAGNOSIS — Z833 Family history of diabetes mellitus: Secondary | ICD-10-CM | POA: Insufficient documentation

## 2019-11-08 DIAGNOSIS — I313 Pericardial effusion (noninflammatory): Secondary | ICD-10-CM | POA: Diagnosis not present

## 2019-11-08 DIAGNOSIS — I428 Other cardiomyopathies: Secondary | ICD-10-CM | POA: Insufficient documentation

## 2019-11-08 DIAGNOSIS — F17210 Nicotine dependence, cigarettes, uncomplicated: Secondary | ICD-10-CM | POA: Insufficient documentation

## 2019-11-08 DIAGNOSIS — Z720 Tobacco use: Secondary | ICD-10-CM | POA: Diagnosis not present

## 2019-11-08 DIAGNOSIS — Z8249 Family history of ischemic heart disease and other diseases of the circulatory system: Secondary | ICD-10-CM | POA: Diagnosis not present

## 2019-11-08 DIAGNOSIS — K2970 Gastritis, unspecified, without bleeding: Secondary | ICD-10-CM | POA: Insufficient documentation

## 2019-11-08 DIAGNOSIS — F1010 Alcohol abuse, uncomplicated: Secondary | ICD-10-CM

## 2019-11-08 DIAGNOSIS — I5022 Chronic systolic (congestive) heart failure: Secondary | ICD-10-CM | POA: Diagnosis present

## 2019-11-08 DIAGNOSIS — F1410 Cocaine abuse, uncomplicated: Secondary | ICD-10-CM | POA: Diagnosis not present

## 2019-11-08 DIAGNOSIS — Z7901 Long term (current) use of anticoagulants: Secondary | ICD-10-CM | POA: Insufficient documentation

## 2019-11-08 LAB — BASIC METABOLIC PANEL
Anion gap: 15 (ref 5–15)
BUN: 30 mg/dL — ABNORMAL HIGH (ref 6–20)
CO2: 24 mmol/L (ref 22–32)
Calcium: 9.7 mg/dL (ref 8.9–10.3)
Chloride: 95 mmol/L — ABNORMAL LOW (ref 98–111)
Creatinine, Ser: 1.25 mg/dL — ABNORMAL HIGH (ref 0.61–1.24)
GFR calc Af Amer: >60 mL/min (ref >60)
GFR calc non Af Amer: >60 mL/min (ref >60)
Glucose, Bld: 100 mg/dL — ABNORMAL HIGH (ref 70–99)
Potassium: 4.5 mmol/L (ref 3.5–5.1)
Sodium: 134 mmol/L — ABNORMAL LOW (ref 135–145)

## 2019-11-08 LAB — CBC
HCT: 39.6 % (ref 39.0–52.0)
Hemoglobin: 12.8 g/dL — ABNORMAL LOW (ref 13.0–17.0)
MCH: 29 pg (ref 26.0–34.0)
MCHC: 32.3 g/dL (ref 30.0–36.0)
MCV: 89.8 fL (ref 80.0–100.0)
Platelets: 261 10*3/uL (ref 150–400)
RBC: 4.41 MIL/uL (ref 4.22–5.81)
RDW: 13.3 % (ref 11.5–15.5)
WBC: 4.4 10*3/uL (ref 4.0–10.5)
nRBC: 0 % (ref 0.0–0.2)

## 2019-11-08 MED ORDER — SERTRALINE HCL 25 MG PO TABS
12.5000 mg | ORAL_TABLET | Freq: Every day | ORAL | 2 refills | Status: DC
Start: 2019-11-08 — End: 2019-11-25

## 2019-11-08 MED ORDER — FOLIC ACID 1 MG PO TABS: 1.0000 mg | ORAL_TABLET | Freq: Every day | ORAL | 3 refills | Status: DC

## 2019-11-08 MED ORDER — PANTOPRAZOLE SODIUM 40 MG PO TBEC: 40.0000 mg | DELAYED_RELEASE_TABLET | Freq: Two times a day (BID) | ORAL | 3 refills | Status: DC

## 2019-11-08 NOTE — Progress Notes (Signed)
CSW met with patient in the clinic to assist with disability paperwork. CSW reviewed paperwork with patient and will have staff and MD complete. CSW shared that patient's mother shared with staff last week that she was concerned about his "drinking". Patient stated that he has been feeling down and admitted to drinking "5 airplane bottles mixed with juice 2-3x week". CSW completed an alcohol audit screening and patient scored a 14 and also completed a PHQ-9 and scored a 10. Patient shared that he has been to inpatient treatment at [facility] in the past as well as AA meetings. Patient states he would like to return to [facility] and denied need for detox stating that he has been able to stop drinking for a week at a time without any withdraw symptoms. Patient was seen by the NP who agreed that patient is struggling with depressive symptoms and started patient on Zoloft. CSW also assisted patient with insurance issues through the [employer] and he states he will be able to manage his premiums going forward as he will be able to get short term disability through [place] his employer. Patient will return next week on July 6th at 10am to follow up with CSW. Patient grateful for the support and follow up. CSW continues to follow for supportive intervention. Raquel Sarna, [place], [place]

## 2019-11-08 NOTE — Progress Notes (Signed)
PCP: none Primary Cardiologist: Dr Haroldine Laws    Reason for F/u: F/u for Chronic Systolic Heart Failure   HPI: 39 y/o male with h/o heavy ETOH and recent cocaine use, PE, recently diagnosed biventricular heart failure.   Admitted with acute onset HF with severe biventricular failure. EF 15%. RV down. He was drinking at least 2 beers every night and about 12-24 over the weekend. Cath shown normal coronaries. Elevated filling pressures with normal output. Placed on milrinone and diuresed with IV lasix. As he improved milrinone was weaned off . Transitioned oral lasix. cMRI shows Noncompaction. Significant daily ETOH intake. CT shows small LLL PE. Placed on xarelto. Started on HF meds.  Discharge weight 178 pounds.   Had initial post hospital f/u 6/14 and was volume overloaded. Given IV Lasix in clinic and home lasix increased to 80 mg daily.   Recently readmitted by [facility] and  discharged 11/02/19  Admitted for mild acute blood loss related anemia due to ongoing most likely upper GI bleed caused from gastritis in the setting of Xarelto use. This likely has been brought on by excessive alcohol intake. He was treated with bowel rest, IV fluids and IV PPI. He was seen by GI and underwent EGD showing no active bleeding but evidence of some gastritis.  He was switched to oral PPI twice daily, H&H remained stable and he did not require any transfusions.  He was instructed to continue Xarelto.  He was seen last week and instructed to increase lasix to 80 mg daily and increase spiro to 12.5 mg twice a day.    Today he returns for HF follow up. Overall feeling ok but says he has been having anxiety and depression. SOB with moderate exertion. Denies PND/Orthopnea. Appetite ok. No fever or chills. Weight at home 191 pounds. No bleeding issues.  He has been taking all medications. Over the last week he has been taking lasix 80 mg twice a day. Says he only drinks when he is anxious or depressed. SBP 90-100s at home.  He does not have alcohol at home. He will go buy 5 small liquor bottles when drinks. Has not had alcohol in 3 days. Smoking 1-2 cigarettes per day. In the past he completed [employer] alcohol program about 10 years ago. Lives with his mom. He does not have medical insurance.   ECHO 10/12/2019 Pericardial effusion remains moderate in size. No tamponade physiology. ECHO 10/05/2019 EF <20%  Prominent trabeculation. Grade III DD  [facility] 10/05/2019 showed normal coronaries, elevated filling pressures with normal output.  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  SH:  Social History   Socioeconomic History  . Marital status: Single    Spouse name: Not on file  . Number of children: Not on file  . Years of education: Not on file  . Highest education level: Not on file  Occupational History  . Not on file  Tobacco Use  . Smoking status: Current Every Day Smoker    Packs/day: 0.50    Types: Cigarettes  . Smokeless tobacco: Never Used  Vaping Use  . Vaping Use: Former  Substance and Sexual Activity  . Alcohol use: Yes  . Drug use: Yes    Types: Marijuana  . Sexual activity: Not on file  Other Topics Concern  . Not on file  Social History Narrative  . Not on file   Social  Determinants of Health   Financial Resource Strain: High Risk  . Difficulty of Paying Living Expenses: Very hard  Food Insecurity: Food Insecurity Present  . Worried About Charity fundraiser in the Last Year: Sometimes true  . Ran Out of Food in the Last Year: Sometimes true  Transportation Needs:   . Lack of Transportation (Medical):   [name] Lack of Transportation (Non-Medical):   Physical Activity:   . Days of Exercise per Week:   . Minutes of Exercise per Session:   Stress:   . Feeling of Stress :   Social Connections:   . Frequency of Communication with Friends and Family:   . Frequency of Social  Gatherings with Friends and Family:   . Attends Religious Services:   . Active Member of Clubs or Organizations:   . Attends Archivist Meetings:   [name] Marital Status:   Intimate Partner Violence:   . Fear of Current or Ex-Partner:   . Emotionally Abused:   [name] Physically Abused:   . Sexually Abused:     FH:  Family History  Problem Relation Age of Onset  . Heart failure Father   . Diabetes Father   . Kidney disease Father     Past Medical History:  Diagnosis Date  . Alcohol use   . Asthma   . CHF (congestive heart failure) (HCC)     Current Outpatient Medications  Medication Sig Dispense Refill  . digoxin (LANOXIN) 0.125 MG tablet Take 1 tablet (0.125 mg total) by mouth daily. 30 tablet 5  . folic acid (FOLVITE) 1 MG tablet Take 1 tablet (1 mg total) by mouth daily. 30 tablet 3  . furosemide (LASIX) 40 MG tablet Take 2 tablets (80 mg total) by mouth daily. 60 tablet 3  . ivabradine (CORLANOR) 5 MG TABS tablet Take 1 tablet (5 mg total) by mouth 2 (two) times daily with a meal. 60 tablet 5  . losartan (COZAAR) 25 MG tablet Take 0.5 tablets (12.5 mg total) by mouth 2 (two) times daily. 30 tablet 5  . Magnesium Oxide 200 MG TABS Take 1 tablet (200 mg total) by mouth 2 (two) times daily. 60 tablet 11  . pantoprazole (PROTONIX) 40 MG tablet Take 1 tablet (40 mg total) by mouth 2 (two) times daily. 60 tablet 0  . rivaroxaban (XARELTO) 20 MG TABS tablet Take 1 tablet (20 mg total) by mouth daily. 30 tablet 0  . spironolactone (ALDACTONE) 25 MG tablet Take 0.5 tablets (12.5 mg total) by mouth 2 (two) times daily. 30 tablet 3  . thiamine 100 MG tablet Take 1 tablet (100 mg total) by mouth daily. 30 tablet 0   No current facility-administered medications for this encounter.    Vitals:   11/08/19 1456  BP: 94/60  Pulse: 65  SpO2: 97%  Weight: 85.5 kg (188 lb 6.4 oz)  Height: 5\' 10"  (1.778 m)   Wt Readings from Last 3 Encounters:  11/08/19 85.5 kg (188 lb 6.4 oz)    11/03/19 86.7 kg (191 lb 2 oz)  11/01/19 83 kg (182 lb 15.7 oz)     General:  Well appearing. No resp difficulty HEENT: normal Neck: supple. no JVD. Carotids 2+ bilat; no bruits. No lymphadenopathy or thryomegaly appreciated. Cor: PMI nondisplaced. Regular rate & rhythm. No rubs, gallops or murmurs. Lungs: clear Abdomen: soft, nontender, nondistended. No hepatosplenomegaly. No bruits or masses. Good bowel sounds. Extremities: no cyanosis, clubbing, rash, edema Neuro: alert & orientedx3, cranial nerves grossly intact.  moves all 4 extremities w/o difficulty. Affect pleasant  ASSESSMENT & PLAN: 1.Chronic Systolic Heart Failure, NICM  - ECHO 5/21 w/ severely reduced EF 15% RV moderately reduced. Suspect ETOH and cocaine playing a role.  - LHC with no coronary disease. 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 Will refer for genetic testing - He has started drinking alcohol again. See below.  - NYHA III. Volume status stable. Continue Lasix back to 80 mg daily. I have asked him to only take lasix 80 mg daily.    - Continue Spiro to 12.5 mg bid  - Continue digoxin 0.125 mg daily .  - Continue losartan 12.5 bid. BP remains to low for entresto.   - Continue Corlanor 5 mg bid. Heart rate 65 much improved.  - No room for SGLT2i - no  blocker yet t  - not a candidate for advanced therapies w/ polysubstance abuse  - discussed importance of abstinence from ETOH, cocaine and tobacco  -  Plan to repeat ECHO in 3 months after med optimization---> September.   2.Pulmonary Emboli - CTA w/ small LLL PE 5/21 - LE Venous dopplers negative for DVT  - Continue xarelto.   - Patient assistance has been started.   3. ETOH Abuse - Heavy drinker for many years.   - Started drinking again. Discussed with SW. He plans to go to [facility] outpatient alcohol program.  - I have added 12.5 mg sertraline to start at bed time.   4. H/O  Asthma  - stable   5. H/O Cocaine Abuse - No use since discharge.  - most recent UDS 10/31/19 negative  - No use recently   6. Pericardial Effusion - moderate circumferential effusion on cMRI. No definite signs of tamponade on study.  - Repeat ECHO in the 3 months   7. Tobacco Abuse - Discussed cessation.   8. Gastritis  - Recent gastritis as outlined above, related to ETOH + Xarelto. Hgb ~11 - advised to quit drinking  - continue bid Protonix for GI protection  - refill for protonix sent. He was given Good RX care.   9. Social - No insurance. We are working to help get his medications.   HFSW appreciated. F/U next week with SW.   Check CBC and BMET .  Follow closely until stabilized.    Robert Plasse  NP-C  Advanced Heart Failure Team   3:14 PM

## 2019-11-08 NOTE — Patient Instructions (Signed)
START Sertraline 12.5 mg, one half tab daily at bedtime START Protonix 40 mg, one tab twice daily  Labs today We will only contact you if something comes back abnormal or we need to make some changes. Otherwise no news is good news!  Your physician recommends that you schedule a follow-up appointment in: 1-2 weeks  in the Advanced Practitioners (PA/NP) Clinic    Do the following things EVERYDAY: 1) Weigh yourself in the morning before breakfast. Write it down and keep it in a log. 2) Take your medicines as prescribed 3) Eat low salt foods--Limit salt (sodium) to 2000 mg per day.  4) Stay as active as you can everyday 5) Limit all fluids for the day to less than 2 liters  At the [facility], you and your health needs are our priority. As part of our continuing mission to provide you with exceptional heart care, we have created designated Provider Care Teams. These Care Teams include your primary Cardiologist (physician) and Advanced Practice Providers (APPs- Physician Assistants and Nurse Practitioners) who all work together to provide you with the care you need, when you need it.   You may see any of the following providers on your designated Care Team at your next follow up: [name] Dr Glori Bickers . Dr Loralie Champagne . Darrick Grinder, NP . Lyda Jester, PA . Audry Riles, PharmD   Please be sure to bring in all your medications bottles to every appointment.

## 2019-11-09 ENCOUNTER — Telehealth (HOSPITAL_COMMUNITY): Payer: Self-pay | Admitting: Licensed Clinical Social Worker

## 2019-11-09 NOTE — Telephone Encounter (Signed)
CSW called patient to follow up on yesterday's visit. Patient states he has not contacted [employer] yet stated to [place] "I will think about it". CSW encouraged patient to seek support to stop drinking. He acknowledged the importance and states he will call back to CSW if further assistance needed. CSW will continue to follow for supportive needs. Raquel Sarna, [place], [place]

## 2019-11-10 NOTE — Telephone Encounter (Signed)
Called and checked the status of Mr. Michalec [employer] application. They are still working through the enrollment process and advised me to call back next week for a determination.  Will follow up.

## 2019-11-11 ENCOUNTER — Encounter (HOSPITAL_COMMUNITY): Payer: BC Managed Care – PPO

## 2019-11-12 ENCOUNTER — Telehealth (HOSPITAL_COMMUNITY): Payer: Self-pay

## 2019-11-12 NOTE — Telephone Encounter (Signed)
No response from pt regarding CR.  Closed referral.  

## 2019-11-14 ENCOUNTER — Emergency Department (HOSPITAL_COMMUNITY)
Admission: EM | Admit: 2019-11-14 | Discharge: 2019-11-14 | Disposition: A | Discharge status: Home / Self Care | Payer: BC Managed Care – PPO | Attending: Emergency Medicine | Admitting: Emergency Medicine

## 2019-11-14 ENCOUNTER — Emergency Department (HOSPITAL_COMMUNITY): Payer: BC Managed Care – PPO

## 2019-11-14 ENCOUNTER — Encounter (HOSPITAL_COMMUNITY): Payer: Self-pay

## 2019-11-14 ENCOUNTER — Other Ambulatory Visit: Payer: Self-pay

## 2019-11-14 DIAGNOSIS — I5022 Chronic systolic (congestive) heart failure: Secondary | ICD-10-CM | POA: Insufficient documentation

## 2019-11-14 DIAGNOSIS — R079 Chest pain, unspecified: Secondary | ICD-10-CM

## 2019-11-14 DIAGNOSIS — N179 Acute kidney failure, unspecified: Secondary | ICD-10-CM

## 2019-11-14 DIAGNOSIS — F17210 Nicotine dependence, cigarettes, uncomplicated: Secondary | ICD-10-CM | POA: Diagnosis not present

## 2019-11-14 DIAGNOSIS — Z20822 Contact with and (suspected) exposure to covid-19: Secondary | ICD-10-CM | POA: Insufficient documentation

## 2019-11-14 DIAGNOSIS — Z79899 Other long term (current) drug therapy: Secondary | ICD-10-CM | POA: Insufficient documentation

## 2019-11-14 DIAGNOSIS — Z7901 Long term (current) use of anticoagulants: Secondary | ICD-10-CM | POA: Insufficient documentation

## 2019-11-14 DIAGNOSIS — R0789 Other chest pain: Secondary | ICD-10-CM | POA: Insufficient documentation

## 2019-11-14 DIAGNOSIS — J45909 Unspecified asthma, uncomplicated: Secondary | ICD-10-CM | POA: Diagnosis not present

## 2019-11-14 LAB — RAPID URINE DRUG SCREEN, HOSP PERFORMED
Amphetamines: NOT DETECTED
Barbiturates: NOT DETECTED
Benzodiazepines: POSITIVE — AB
Cocaine: NOT DETECTED
Opiates: NOT DETECTED
Tetrahydrocannabinol: POSITIVE — AB

## 2019-11-14 LAB — BASIC METABOLIC PANEL
Anion gap: 18 — ABNORMAL HIGH (ref 5–15)
BUN: 29 mg/dL — ABNORMAL HIGH (ref 6–20)
CO2: 21 mmol/L — ABNORMAL LOW (ref 22–32)
Calcium: 9.1 mg/dL (ref 8.9–10.3)
Chloride: 96 mmol/L — ABNORMAL LOW (ref 98–111)
Creatinine, Ser: 1.43 mg/dL — ABNORMAL HIGH (ref 0.61–1.24)
GFR calc Af Amer: >60 mL/min (ref >60)
GFR calc non Af Amer: >60 mL/min (ref >60)
Glucose, Bld: 99 mg/dL (ref 70–99)
Potassium: 4.4 mmol/L (ref 3.5–5.1)
Sodium: 135 mmol/L (ref 135–145)

## 2019-11-14 LAB — TROPONIN I (HIGH SENSITIVITY)
Troponin I (High Sensitivity): 11 ng/L (ref <18)
Troponin I (High Sensitivity): 12 ng/L (ref <18)

## 2019-11-14 LAB — CBC
HCT: 38.6 % — ABNORMAL LOW (ref 39.0–52.0)
Hemoglobin: 12.7 g/dL — ABNORMAL LOW (ref 13.0–17.0)
MCH: 28.3 pg (ref 26.0–34.0)
MCHC: 32.9 g/dL (ref 30.0–36.0)
MCV: 86 fL (ref 80.0–100.0)
Platelets: 401 10*3/uL — ABNORMAL HIGH (ref 150–400)
RBC: 4.49 MIL/uL (ref 4.22–5.81)
RDW: 13.5 % (ref 11.5–15.5)
WBC: 4.9 10*3/uL (ref 4.0–10.5)
nRBC: 0 % (ref 0.0–0.2)

## 2019-11-14 LAB — LACTIC ACID, PLASMA: Lactic Acid, Venous: 1.1 mmol/L (ref 0.5–1.9)

## 2019-11-14 LAB — ETHANOL: Alcohol, Ethyl (B): <10 mg/dL (ref <10)

## 2019-11-14 LAB — SARS CORONAVIRUS 2 BY RT PCR (HOSPITAL ORDER, PERFORMED IN ~~LOC~~ HOSPITAL LAB): SARS Coronavirus 2: NEGATIVE

## 2019-11-14 LAB — BRAIN NATRIURETIC PEPTIDE: B Natriuretic Peptide: 360.6 pg/mL — ABNORMAL HIGH (ref 0.0–100.0)

## 2019-11-14 MED ORDER — FAMOTIDINE 20 MG PO TABS
20.0000 mg | ORAL_TABLET | Freq: Once | ORAL | Status: AC
  Administered 2019-11-14: 20 mg via ORAL
  Filled 2019-11-14: qty 1

## 2019-11-14 NOTE — Discharge Instructions (Addendum)
Follow-up at your appointment this week with your cardiologist and your primary care doctor.  Return for new or worsening symptoms.  Try acid reflux medication to see if it helps with your discomfort.  If you have protonix take that, if not take Pepcid daily and its over-the-counter.

## 2019-11-14 NOTE — ED Provider Notes (Signed)
[facility] EMERGENCY DEPARTMENT Provider Note   CSN: [id number] Arrival date & time: 11/14/19  1557     History Chief Complaint  Patient presents with  . Chest Pain    Robert [name] is a 39 y.o. male.  Patient with history of alcohol abuse, cocaine abuse, recent admission and significant work-up for congestive heart failure and pulmonary embolism presents with intermittent chest pain and mild shortness of breath since earlier today. Patient's had this however is not lasted this long but he said this intermittently the past week. Per report patient has not followed up regularly with cardiac rehab however he does report compliance with medications including anticoagulant. Patient denies exertional chest pain. Patient denies fevers or infectious symptoms.        Past Medical History:  Diagnosis Date  . Alcohol use   . Asthma   . CHF (congestive heart failure) [facility])     Patient Active Problem List   Diagnosis Date Noted  . AKI (acute kidney injury) ([place]) 10/30/2019  . Gastritis 10/30/2019  . Elevated INR 10/30/2019  . 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[place])   . Acute heart failure ([place]) 10/05/2019  . Pulmonary embolism ([place]) 10/04/2019  . Acute pulmonary embolism ([place]) 10/04/2019  . Chronic systolic CHF (congestive heart failure) ([place])   . Inappropriate sinus tachycardia   . Chest pain 11/03/2013  . Tobacco abuse 11/03/2013    Past Surgical History:  Procedure Laterality Date  . ESOPHAGOGASTRODUODENOSCOPY (EGD) WITH PROPOFOL N/A 11/01/2019   Procedure: ESOPHAGOGASTRODUODENOSCOPY (EGD) WITH PROPOFOL;  Surgeon: [name], MD;  Location: [facility] ENDOSCOPY;  Service: Endoscopy;  Laterality: N/A;  . RIGHT/LEFT HEART CATH AND CORONARY ANGIOGRAPHY N/A 10/06/2019   Procedure: RIGHT/LEFT HEART CATH AND CORONARY ANGIOGRAPHY;  Surgeon: [name], MD;  Location: [facility] CV LAB;  Service: Cardiovascular;  Laterality: N/A;  .  WRIST SURGERY         Family History  Problem Relation Age of Onset  . Heart failure Father   . Diabetes Father   . Kidney disease Father     Social History   Tobacco Use  . Smoking status: Current Every Day Smoker    Packs/day: 0.50    Types: Cigarettes  . Smokeless tobacco: Never Used  Vaping Use  . Vaping Use: Former  Substance Use Topics  . Alcohol use: Yes  . Drug use: Yes    Types: Marijuana    Home Medications Prior to Admission medications   Medication Sig Start Date End Date Taking? Authorizing Provider  digoxin (LANOXIN) 0.125 MG tablet Take 1 tablet (0.125 mg total) by mouth daily. 10/14/19  Yes Lyda Jester M, PA-C  folic acid (FOLVITE) 1 MG tablet Take 1 tablet (1 mg total) by mouth daily. 11/08/19  Yes Clegg, Amy D, NP  furosemide (LASIX) 80 MG tablet Take 80 mg by mouth daily.    Yes [provider]  ivabradine (CORLANOR) 5 MG TABS tablet Take 1 tablet (5 mg total) by mouth 2 (two) times daily with a meal. 10/13/19  Yes Lyda Jester M, PA-C  losartan (COZAAR) 25 MG tablet Take 0.5 tablets (12.5 mg total) by mouth 2 (two) times daily. 10/13/19  Yes Lyda Jester M, PA-C  Magnesium Oxide 200 MG TABS Take 1 tablet (200 mg total) by mouth 2 (two) times daily. 10/28/19  Yes Clegg, Amy D, NP  pantoprazole (PROTONIX) 40 MG tablet Take 1 tablet (40 mg total) by mouth 2 (two) times  daily. 11/08/19  Yes Clegg, Amy D, NP  rivaroxaban (XARELTO) 20 MG TABS tablet Take 1 tablet (20 mg total) by mouth daily. 11/03/19  Yes Thurnell Lose, MD  spironolactone (ALDACTONE) 25 MG tablet Take 0.5 tablets (12.5 mg total) by mouth 2 (two) times daily. 11/03/19  Yes Lyda Jester M, PA-C  thiamine 100 MG tablet Take 1 tablet (100 mg total) by mouth daily. 11/03/19  Yes Thurnell Lose, MD  furosemide (LASIX) 40 MG tablet Take 2 tablets (80 mg total) by mouth daily. Patient not taking: Reported on 11/08/2019 11/03/19   Lyda Jester M, PA-C  sertraline  (ZOLOFT) 25 MG tablet Take 0.5 tablets (12.5 mg total) by mouth daily. Patient not taking: Reported on 11/14/2019 11/08/19 11/07/20  Conrad [place], NP    Allergies    Bactrim [sulfamethoxazole-trimethoprim]  Review of Systems   Review of Systems  Constitutional: Negative for chills and fever.  HENT: Negative for congestion.   Eyes: Negative for visual disturbance.  Respiratory: Positive for shortness of breath.   Cardiovascular: Positive for chest pain. Negative for leg swelling.  Gastrointestinal: Positive for nausea. Negative for abdominal pain and vomiting.  Genitourinary: Negative for dysuria and flank pain.  Musculoskeletal: Negative for back pain, neck pain and neck stiffness.  Skin: Negative for rash.  Neurological: Negative for light-headedness and headaches.    Physical Exam Updated Vital Signs BP 111/63   Pulse 79   Temp 98.1 F (36.7 C) (Oral)   Resp 16   Ht 5\' 10"  (1.778 m)   Wt 84.8 kg   SpO2 97%   BMI 26.83 kg/m   Physical Exam Vitals and nursing note reviewed.  Constitutional:      Appearance: He is well-developed.  HENT:     Head: Normocephalic and atraumatic.  Eyes:     General:        Right eye: No discharge.        Left eye: No discharge.     Conjunctiva/sclera: Conjunctivae normal.  Neck:     Trachea: No tracheal deviation.  Cardiovascular:     Rate and Rhythm: Normal rate and regular rhythm.     Heart sounds: Normal heart sounds.  Pulmonary:     Effort: Pulmonary effort is normal.     Breath sounds: Normal breath sounds.  Abdominal:     General: There is no distension.     Palpations: Abdomen is soft.     Tenderness: There is no abdominal tenderness. There is no guarding.  Musculoskeletal:     Cervical back: Normal range of motion and neck supple.     Right lower leg: No tenderness. No edema.     Left lower leg: No tenderness. No edema.  Skin:    General: Skin is warm.     Capillary Refill: Capillary refill takes less than 2 seconds.      Findings: No rash.  Neurological:     General: No focal deficit present.     Mental Status: He is alert and oriented to person, place, and time.     ED Results / Procedures / Treatments   Labs (all labs ordered are listed, but only abnormal results are displayed) Labs Reviewed  BASIC METABOLIC PANEL - Abnormal; Notable for the following components:      Result Value   Chloride 96 (*)    CO2 21 (*)    BUN 29 (*)    Creatinine, Ser 1.43 (*)    Anion gap 18 (*)  All other components within normal limits  CBC - Abnormal; Notable for the following components:   Hemoglobin 12.7 (*)    HCT 38.6 (*)    Platelets 401 (*)    All other components within normal limits  BRAIN NATRIURETIC PEPTIDE - Abnormal; Notable for the following components:   B Natriuretic Peptide 360.6 (*)    All other components within normal limits  RAPID URINE DRUG SCREEN, HOSP PERFORMED - Abnormal; Notable for the following components:   Benzodiazepines POSITIVE (*)    Tetrahydrocannabinol POSITIVE (*)    All other components within normal limits  SARS CORONAVIRUS 2 BY RT PCR (HOSPITAL ORDER, [facility])  LACTIC ACID, PLASMA  ETHANOL  LACTIC ACID, PLASMA  DRUG PROFILE, UR, 9 DRUGS ([facility])  TROPONIN I (HIGH SENSITIVITY)  TROPONIN I (HIGH SENSITIVITY)    EKG EKG Interpretation  Date/Time:  Sunday November 14 2019 17:50:51 EDT Ventricular Rate:  67 PR Interval:    QRS Duration: 94 QT Interval:  502 QTC Calculation: 530 R Axis:   72 Text Interpretation: Sinus rhythm LVH w/ repol abnormalities, possible ischemia Prolonged QT interval worsening T wave inversions anterior and lateral Confirmed by Elnora Morrison [id number]) on 11/14/2019 5:54:14 PM   Radiology DG Chest 2 View  Result Date: 11/14/2019 CLINICAL DATA:  Chest pain and shortness of breath. EXAM: CHEST - 2 VIEW COMPARISON:  10/30/2019 FINDINGS: Lungs are adequately inflated without consolidation or effusion. Flattening of the  hemidiaphragms on the lateral film. Moderate stable cardiomegaly. Remainder the exam is unchanged. IMPRESSION: No acute cardiopulmonary disease. Moderate stable cardiomegaly. Electronically Signed   By: Marin Olp M.D.   On: 11/14/2019 16:39    Procedures Procedures (including critical care time)  Medications Ordered in ED Medications  famotidine (PEPCID) tablet 20 mg (20 mg Oral Given 11/14/19 2042)    ED Course  I have reviewed the triage vital signs and the nursing notes.  Pertinent labs & imaging results that were available during my care of the patient were reviewed by me and considered in my medical decision making (see chart for details).    MDM Rules/Calculators/A&P                          Patient with recent significant medical history and admission presents with intermittent chest pain shortness of breath. Patient overall well-appearing on exam lungs are clear, clinically no evidence of fluid overload or heart failure. Patient is compliant with anticoagulant and currently no shortness of breath no tachycardia no hypoxia and with mild worsening kidney function creatinine 1.43 I do not feel repeat CT scan will change his management. EKG did show mild worsening of T wave inversions across the precordium, discussed with cardiology was consulted and evaluated the patient and recommend continued outpatient follow-up at his appointment this week. Pepcid ordered to see if it help with his mild discomfort. Troponin negative.  Chest x-ray ordered and reviewed stable cardiomegaly. Reviewed medical records from recent discharge admission and patient had echo done showing moderate pericardial effusion, significant decreased ejection fraction 15 to 20% and reviewed note from cardiology.  Patient currently stable for outpatient follow-up no indication for admission at this time. Final Clinical Impression(s) / ED Diagnoses Final diagnoses:  Acute chest pain  Acute renal failure, unspecified  acute renal failure type [facility])    Rx / DC Orders ED Discharge Orders    None       Elnora Morrison, MD 11/14/19  2141  

## 2019-11-14 NOTE — ED Notes (Signed)
Discharge instructions reviewed with pt. Pt verbalized understanding.   

## 2019-11-14 NOTE — ED Triage Notes (Signed)
Pt arrives POV for eval of ongoing chest pain, SOB w/ associated nausea. Pt was admitted last month for same complaints and dx'd w/ CHF at that time. Has been non responsive to follow ups for cardiac rehab, reports compliance w/ medications prescribed at DC.

## 2019-11-16 ENCOUNTER — Encounter (HOSPITAL_COMMUNITY): Payer: Self-pay | Admitting: Licensed Clinical Social Worker

## 2019-11-16 NOTE — Progress Notes (Signed)
CSW me with patient in the clinic. Patient reports he has not picked up hs medication at [facility] and has not followed up with [facility] for substance abuse counseling. Patient did share that he was approved for short term disability and will be getting paid tomorrow. CSW discussed good RX card and reduced cost for medications and assisted with patient care fund to obtain meds today. The one medications is the Zoloft to help with his admitted depression. CSW and patient spoke of increased anxiety and struggles with current health contributing to his depression. He states living at home with his mother gets him down as he lacks independence. CSW provided supportive intervention and discussed coping skills. Patient states he has not had any alcohol or substances since last visit. He states "I am done with that". CSW discussed attending AA for support and also to give him purpose during the day as he states he is bored not being able to work. He states he is also interested in going to cardiac rehab and has missed the calls for the orientation. A new order was placed in Epic and patient should hear within the next few days. CSW also discussed attending the [facility] Heart failure Men's Group which will be held on July 27th at 3:30pm. Patient is agreeable to attend AA and the men's group and will go to [facility] to pick up his meds this afternoon. Patient has an appointment next week in the APP clinic for follow up. CSW continues to follow for support and any other needs as they arise.Raquel Sarna, [place], [place]

## 2019-11-16 NOTE — Telephone Encounter (Signed)
Called [employer] this morning to see if they had an update. I was advised to call back Friday.  Will follow up.

## 2019-11-20 ENCOUNTER — Other Ambulatory Visit: Payer: Self-pay

## 2019-11-20 ENCOUNTER — Encounter (HOSPITAL_COMMUNITY): Payer: Self-pay | Admitting: Emergency Medicine

## 2019-11-20 ENCOUNTER — Emergency Department (HOSPITAL_COMMUNITY): Payer: BC Managed Care – PPO

## 2019-11-20 ENCOUNTER — Emergency Department (HOSPITAL_COMMUNITY)
Admission: EM | Admit: 2019-11-20 | Discharge: 2019-11-21 | Disposition: A | Discharge status: Home / Self Care | Payer: BC Managed Care – PPO | Attending: Emergency Medicine | Admitting: Emergency Medicine

## 2019-11-20 DIAGNOSIS — F10920 Alcohol use, unspecified with intoxication, uncomplicated: Secondary | ICD-10-CM

## 2019-11-20 DIAGNOSIS — R079 Chest pain, unspecified: Secondary | ICD-10-CM | POA: Diagnosis present

## 2019-11-20 DIAGNOSIS — I5022 Chronic systolic (congestive) heart failure: Secondary | ICD-10-CM | POA: Insufficient documentation

## 2019-11-20 DIAGNOSIS — F332 Major depressive disorder, recurrent severe without psychotic features: Secondary | ICD-10-CM | POA: Insufficient documentation

## 2019-11-20 DIAGNOSIS — F32A Depression, unspecified: Secondary | ICD-10-CM

## 2019-11-20 DIAGNOSIS — F1020 Alcohol dependence, uncomplicated: Secondary | ICD-10-CM | POA: Insufficient documentation

## 2019-11-20 DIAGNOSIS — R0602 Shortness of breath: Secondary | ICD-10-CM | POA: Diagnosis not present

## 2019-11-20 DIAGNOSIS — F17210 Nicotine dependence, cigarettes, uncomplicated: Secondary | ICD-10-CM | POA: Insufficient documentation

## 2019-11-20 DIAGNOSIS — Y908 Blood alcohol level of 240 mg/100 ml or more: Secondary | ICD-10-CM | POA: Diagnosis not present

## 2019-11-20 DIAGNOSIS — J45909 Unspecified asthma, uncomplicated: Secondary | ICD-10-CM | POA: Insufficient documentation

## 2019-11-20 DIAGNOSIS — Z79899 Other long term (current) drug therapy: Secondary | ICD-10-CM | POA: Insufficient documentation

## 2019-11-20 DIAGNOSIS — F1210 Cannabis abuse, uncomplicated: Secondary | ICD-10-CM | POA: Insufficient documentation

## 2019-11-20 DIAGNOSIS — Z7901 Long term (current) use of anticoagulants: Secondary | ICD-10-CM | POA: Insufficient documentation

## 2019-11-20 LAB — CBC
HCT: 40.8 % (ref 39.0–52.0)
Hemoglobin: 14 g/dL (ref 13.0–17.0)
MCH: 29.3 pg (ref 26.0–34.0)
MCHC: 34.3 g/dL (ref 30.0–36.0)
MCV: 85.4 fL (ref 80.0–100.0)
Platelets: 355 10*3/uL (ref 150–400)
RBC: 4.78 MIL/uL (ref 4.22–5.81)
RDW: 13.3 % (ref 11.5–15.5)
WBC: 4.1 10*3/uL (ref 4.0–10.5)
nRBC: 0 % (ref 0.0–0.2)

## 2019-11-20 LAB — BASIC METABOLIC PANEL
Anion gap: 17 — ABNORMAL HIGH (ref 5–15)
BUN: 36 mg/dL — ABNORMAL HIGH (ref 6–20)
CO2: 22 mmol/L (ref 22–32)
Calcium: 9.2 mg/dL (ref 8.9–10.3)
Chloride: 99 mmol/L (ref 98–111)
Creatinine, Ser: 1.5 mg/dL — ABNORMAL HIGH (ref 0.61–1.24)
GFR calc Af Amer: >60 mL/min (ref >60)
GFR calc non Af Amer: 58 mL/min — ABNORMAL LOW (ref >60)
Glucose, Bld: 110 mg/dL — ABNORMAL HIGH (ref 70–99)
Potassium: 4.6 mmol/L (ref 3.5–5.1)
Sodium: 138 mmol/L (ref 135–145)

## 2019-11-20 LAB — RAPID URINE DRUG SCREEN, HOSP PERFORMED
Amphetamines: NOT DETECTED
Barbiturates: NOT DETECTED
Benzodiazepines: NOT DETECTED
Cocaine: NOT DETECTED
Opiates: NOT DETECTED
Tetrahydrocannabinol: POSITIVE — AB

## 2019-11-20 LAB — ETHANOL: Alcohol, Ethyl (B): 391 mg/dL (ref <10)

## 2019-11-20 LAB — TROPONIN I (HIGH SENSITIVITY)
Troponin I (High Sensitivity): 20 ng/L — ABNORMAL HIGH (ref <18)
Troponin I (High Sensitivity): 20 ng/L — ABNORMAL HIGH (ref <18)

## 2019-11-20 MED ORDER — SODIUM CHLORIDE 0.9% FLUSH: 3.0000 mL | Freq: Once | INTRAVENOUS | Status: DC

## 2019-11-20 NOTE — ED Triage Notes (Signed)
Per EMS, Mom called b/c pt has been experiencing CP, SOB, N/V/weakness.  Pt is newly diagnosed w/ CHF which is causing him some depression.  Told EMS, he "wishes he could die, feels doomed, does not want to be a burden to family, is in despair".   Was Orthostatic w/ EMS, gave 500NS  Pt did admit to depression to RN and "not wanting to live however denied plan.  Pt is tearful in triage and does appear very depressed, is cooperative.

## 2019-11-20 NOTE — ED Provider Notes (Signed)
[facility] EMERGENCY DEPARTMENT Provider Note   CSN: [id number] Arrival date & time: 11/20/19  2052     History Chief Complaint  Patient presents with  . Chest Pain  . depressed    English Robert [name] is a 39 y.o. male with PMHx alcohol abuse, cocaine abuse, asthma, CHF, and PE currently on Xarelto who presents to the ED via EMS for chest pain/SOB. Per triage report mom called out because pt has been experiencing cp, SOB, N/V, and weakness. Pt is a newly diagnosed CHF pt which has been cauisng his depression. Pt apparently told EMS he "wishes he could die, feels doomed, does not want to be a burden to family, is in despair." Pt was found to be orthostatic with EMS and given 500 NS.   Pt is currently denying depression or SI. He states he has been having some intermittent substernal chest pain; none currently however did have an episode that lasted several hours today with associated SOB. States he has been complaint with his medications. He has no complaints currently.   The history is provided by the patient and medical records.       Past Medical History:  Diagnosis Date  . Alcohol use   . Asthma   . CHF (congestive heart failure) [facility])     Patient Active Problem List   Diagnosis Date Noted  . AKI (acute kidney injury) ([place]) 10/30/2019  . Gastritis 10/30/2019  . Elevated INR 10/30/2019  . 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[place])   . Acute heart failure ([place]) 10/05/2019  . Pulmonary embolism ([place]) 10/04/2019  . Acute pulmonary embolism ([place]) 10/04/2019  . Chronic systolic CHF (congestive heart failure) ([place])   . Inappropriate sinus tachycardia   . Chest pain 11/03/2013  . Tobacco abuse 11/03/2013    Past Surgical History:  Procedure Laterality Date  . ESOPHAGOGASTRODUODENOSCOPY (EGD) WITH PROPOFOL N/A 11/01/2019   Procedure: ESOPHAGOGASTRODUODENOSCOPY (EGD) WITH PROPOFOL;  Surgeon: [name], MD;  Location: [facility] ENDOSCOPY;   Service: Endoscopy;  Laterality: N/A;  . RIGHT/LEFT HEART CATH AND CORONARY ANGIOGRAPHY N/A 10/06/2019   Procedure: RIGHT/LEFT HEART CATH AND CORONARY ANGIOGRAPHY;  Surgeon: [name], MD;  Location: [facility] LAB;  Service: Cardiovascular;  Laterality: N/A;  . WRIST SURGERY         Family History  Problem Relation Age of Onset  . Heart failure Father   . Diabetes Father   . Kidney disease Father     Social History   Tobacco Use  . Smoking status: Current Every Day Smoker    Packs/day: 0.50    Types: Cigarettes  . Smokeless tobacco: Never Used  Vaping Use  . Vaping Use: Former  Substance Use Topics  . Alcohol use: Yes  . Drug use: Yes    Types: Marijuana    Home Medications Prior to Admission medications   Medication Sig Start Date End Date Taking? Authorizing Provider  digoxin (LANOXIN) 0.125 MG tablet Take 1 tablet (0.125 mg total) by mouth daily. 10/14/19  Yes Lyda Jester M, PA-C  folic acid (FOLVITE) 1 MG tablet Take 1 tablet (1 mg total) by mouth daily. 11/08/19  Yes Clegg, Amy D, NP  furosemide (LASIX) 80 MG tablet Take 80 mg by mouth daily.    Yes [provider]  ivabradine (CORLANOR) 5 MG TABS tablet Take 1 tablet (5 mg total) by mouth 2 (two) times daily with a meal. 10/13/19  Yes Consuelo Pandy, PA-C  losartan (COZAAR) 25 MG tablet Take 0.5 tablets (12.5 mg total) by mouth 2 (two) times daily. 10/13/19  Yes Lyda Jester M, PA-C  Magnesium Oxide 200 MG TABS Take 1 tablet (200 mg total) by mouth 2 (two) times daily. 10/28/19  Yes Clegg, Amy D, NP  pantoprazole (PROTONIX) 40 MG tablet Take 1 tablet (40 mg total) by mouth 2 (two) times daily. 11/08/19  Yes Clegg, Amy D, NP  rivaroxaban (XARELTO) 20 MG TABS tablet Take 1 tablet (20 mg total) by mouth daily. 11/03/19  Yes Thurnell Lose, MD  sertraline (ZOLOFT) 25 MG tablet Take 0.5 tablets (12.5 mg total) by mouth daily. 11/08/19 11/07/20 Yes Clegg, Amy D, NP  spironolactone (ALDACTONE) 25  MG tablet Take 0.5 tablets (12.5 mg total) by mouth 2 (two) times daily. 11/03/19  Yes Lyda Jester M, PA-C  thiamine 100 MG tablet Take 1 tablet (100 mg total) by mouth daily. 11/03/19  Yes Thurnell Lose, MD  furosemide (LASIX) 40 MG tablet Take 2 tablets (80 mg total) by mouth daily. Patient not taking: Reported on 11/08/2019 11/03/19   Consuelo Pandy, PA-C    Allergies    Bactrim [sulfamethoxazole-trimethoprim]  Review of Systems   Review of Systems  Constitutional: Negative for chills and fever.  Respiratory: Positive for shortness of breath. Negative for cough.   Cardiovascular: Positive for chest pain.  All other systems reviewed and are negative.   Physical Exam Updated Vital Signs BP (!) 116/94 (BP Location: Left Arm)   Pulse 94   Temp 98.5 F (36.9 C) (Oral)   Resp 18   SpO2 100%   Physical Exam Vitals and nursing note reviewed.  Constitutional:      Appearance: He is not ill-appearing or diaphoretic.  HENT:     Head: Normocephalic and atraumatic.  Eyes:     Conjunctiva/sclera: Conjunctivae normal.  Cardiovascular:     Rate and Rhythm: Normal rate and regular rhythm.     Pulses:          Radial pulses are 2+ on the right side and 2+ on the left side.       Dorsalis pedis pulses are 2+ on the right side and 2+ on the left side.  Pulmonary:     Effort: Pulmonary effort is normal.     Breath sounds: Normal breath sounds. No decreased breath sounds, wheezing, rhonchi or rales.  Chest:     Chest wall: No tenderness.  Abdominal:     Palpations: Abdomen is soft.     Tenderness: There is no abdominal tenderness. There is no guarding or rebound.  Musculoskeletal:     Cervical back: Neck supple.     Comments: Nonpitting edema bilaterally  Skin:    General: Skin is warm and dry.  Neurological:     Mental Status: He is alert.  Psychiatric:        Mood and Affect: Affect is flat.     ED Results / Procedures / Treatments   Labs (all labs ordered are  listed, but only abnormal results are displayed) Labs Reviewed  BASIC METABOLIC PANEL - Abnormal; Notable for the following components:      Result Value   Glucose, Bld 110 (*)    BUN 36 (*)    Creatinine, Ser 1.50 (*)    GFR calc non Af Amer 58 (*)    Anion gap 17 (*)    All other components within normal limits  ETHANOL - Abnormal; Notable for the following  components:   Alcohol, Ethyl (B) 391 (*)    All other components within normal limits  RAPID URINE DRUG SCREEN, HOSP PERFORMED - Abnormal; Notable for the following components:   Tetrahydrocannabinol POSITIVE (*)    All other components within normal limits  BRAIN NATRIURETIC PEPTIDE - Abnormal; Notable for the following components:   B Natriuretic Peptide 284.7 (*)    All other components within normal limits  TROPONIN I (HIGH SENSITIVITY) - Abnormal; Notable for the following components:   Troponin I (High Sensitivity) 20 (*)    All other components within normal limits  TROPONIN I (HIGH SENSITIVITY) - Abnormal; Notable for the following components:   Troponin I (High Sensitivity) 20 (*)    All other components within normal limits  CBC    EKG EKG Interpretation  Date/Time:  Saturday November 20 2019 22:59:07 EDT Ventricular Rate:  79 PR Interval:    QRS Duration: 98 QT Interval:  397 QTC Calculation: 456 R Axis:   73 Text Interpretation: Sinus rhythm Probable left atrial enlargement LVH with secondary repolarization abnormality No significant change was found Confirmed by Ripley Fraise [phone]) on 11/20/2019 11:36:56 PM   Radiology DG Chest 2 View  Result Date: 11/20/2019 CLINICAL DATA:  Chest pain and shortness of breath EXAM: CHEST - 2 VIEW COMPARISON:  Radiograph 11/14/2019, CT 10/04/2019 FINDINGS: No consolidation, features of edema, pneumothorax, or effusion. Cardiomegaly is similar to prior. Remaining cardiomediastinal contours are unremarkable. No acute osseous or soft tissue abnormality. IMPRESSION: 1. No  acute cardiopulmonary abnormality. 2. Stable cardiomegaly. Electronically Signed   By: Lovena Le M.D.   On: 11/20/2019 21:52    Procedures Procedures (including critical care time)  Medications Ordered in ED Medications  sodium chloride flush (NS) 0.9 % injection 3 mL (has no administration in time range)    ED Course  I have reviewed the triage vital signs and the nursing notes.  Pertinent labs & imaging results that were available during my care of the patient were reviewed by me and considered in my medical decision making (see chart for details).    MDM Rules/Calculators/A&P                          39 year old male presents to the ED today with complaint of suicidal ideation.  Apparently EMS was called out by his mother with complaint of chest pain and worsening depression.  He is a new CHF diagnosis patient.  On arrival to the ED patient is afebrile, nontachycardic nontachypneic.  He is not having any active chest pain.  He was worked up while in the waiting room for chest pain.  EKG unchanged from previous.  Chest x-ray with stable cardiomegaly.  Alcohol level noted to be elevated at 391.  Creatinine mildly elevated at 1.50, mildly increased from 7 days ago. Tolerating water in the ED. Do not feel he needs IVF at this time.   Lab Results  Component Value Date   CREATININE 1.50 (H) 11/20/2019   CREATININE 1.43 (H) 11/14/2019   CREATININE 1.25 (H) 11/08/2019   Troponin of 20. Will repeat.  BNP 284.7, improving.   BNP (last 3 results) Recent Labs    11/02/19 0403 11/14/19 1921 11/20/19 2123  BNP 1,196.8* 360.6* 284.7*   Pt brought back to the room after labs done. He is denying SI currently however he did admit this to the nurse. Will have him see TTS.   Repeat trop of 20. Do not feel  pt requires admission at this time from a cardiac standpoint.   TTS has evaluated. See note. Pt refused treatment for alcohol use and depressive sx however pt refused treatment and pt  does not meet criteria for IVC. He will be discharged at this time.   This note was prepared using Dragon voice recognition software and may include unintentional dictation errors due to the inherent limitations of voice recognition software.  Final Clinical Impression(s) / ED Diagnoses Final diagnoses:  Depression, unspecified depression type  Nonspecific chest pain  Alcoholic intoxication without complication ([place])    Rx / DC Orders ED Discharge Orders    None       Discharge Instructions     Please follow up with your cardiologist and primary care provider Return to the ED for any worsening symptoms       Eustaquio Maize, PA-C 11/21/19 0422    Ripley Fraise, MD 11/21/19 [phone]

## 2019-11-21 ENCOUNTER — Other Ambulatory Visit: Payer: Self-pay

## 2019-11-21 ENCOUNTER — Encounter (HOSPITAL_COMMUNITY): Payer: Self-pay

## 2019-11-21 ENCOUNTER — Emergency Department (HOSPITAL_COMMUNITY): Payer: BC Managed Care – PPO

## 2019-11-21 ENCOUNTER — Emergency Department (HOSPITAL_COMMUNITY)
Admission: EM | Admit: 2019-11-21 | Discharge: 2019-11-21 | Disposition: A | Discharge status: Home / Self Care | Payer: BC Managed Care – PPO | Source: Home / Self Care | Attending: Emergency Medicine | Admitting: Emergency Medicine

## 2019-11-21 DIAGNOSIS — F1020 Alcohol dependence, uncomplicated: Secondary | ICD-10-CM

## 2019-11-21 DIAGNOSIS — I5022 Chronic systolic (congestive) heart failure: Secondary | ICD-10-CM | POA: Insufficient documentation

## 2019-11-21 DIAGNOSIS — R0789 Other chest pain: Secondary | ICD-10-CM | POA: Insufficient documentation

## 2019-11-21 DIAGNOSIS — F17210 Nicotine dependence, cigarettes, uncomplicated: Secondary | ICD-10-CM | POA: Insufficient documentation

## 2019-11-21 DIAGNOSIS — Z79899 Other long term (current) drug therapy: Secondary | ICD-10-CM | POA: Insufficient documentation

## 2019-11-21 DIAGNOSIS — J45909 Unspecified asthma, uncomplicated: Secondary | ICD-10-CM | POA: Insufficient documentation

## 2019-11-21 DIAGNOSIS — R079 Chest pain, unspecified: Secondary | ICD-10-CM

## 2019-11-21 LAB — BASIC METABOLIC PANEL
Anion gap: 16 — ABNORMAL HIGH (ref 5–15)
BUN: 31 mg/dL — ABNORMAL HIGH (ref 6–20)
CO2: 26 mmol/L (ref 22–32)
Calcium: 9.6 mg/dL (ref 8.9–10.3)
Chloride: 95 mmol/L — ABNORMAL LOW (ref 98–111)
Creatinine, Ser: 1.5 mg/dL — ABNORMAL HIGH (ref 0.61–1.24)
GFR calc Af Amer: >60 mL/min (ref >60)
GFR calc non Af Amer: 58 mL/min — ABNORMAL LOW (ref >60)
Glucose, Bld: 119 mg/dL — ABNORMAL HIGH (ref 70–99)
Potassium: 4.2 mmol/L (ref 3.5–5.1)
Sodium: 137 mmol/L (ref 135–145)

## 2019-11-21 LAB — RAPID URINE DRUG SCREEN, HOSP PERFORMED
Amphetamines: NOT DETECTED
Barbiturates: NOT DETECTED
Benzodiazepines: POSITIVE — AB
Cocaine: NOT DETECTED
Opiates: NOT DETECTED
Tetrahydrocannabinol: POSITIVE — AB

## 2019-11-21 LAB — CBC
HCT: 40.3 % (ref 39.0–52.0)
Hemoglobin: 13.3 g/dL (ref 13.0–17.0)
MCH: 28.7 pg (ref 26.0–34.0)
MCHC: 33 g/dL (ref 30.0–36.0)
MCV: 86.9 fL (ref 80.0–100.0)
Platelets: 289 10*3/uL (ref 150–400)
RBC: 4.64 MIL/uL (ref 4.22–5.81)
RDW: 13.5 % (ref 11.5–15.5)
WBC: 4.7 10*3/uL (ref 4.0–10.5)
nRBC: 0 % (ref 0.0–0.2)

## 2019-11-21 LAB — HEPATIC FUNCTION PANEL
ALT: 34 U/L (ref 0–44)
AST: 35 U/L (ref 15–41)
Albumin: 4.6 g/dL (ref 3.5–5.0)
Alkaline Phosphatase: 45 U/L (ref 38–126)
Bilirubin, Direct: 0.1 mg/dL (ref 0.0–0.2)
Indirect Bilirubin: 1 mg/dL — ABNORMAL HIGH (ref 0.3–0.9)
Total Bilirubin: 1.1 mg/dL (ref 0.3–1.2)
Total Protein: 8.3 g/dL — ABNORMAL HIGH (ref 6.5–8.1)

## 2019-11-21 LAB — TROPONIN I (HIGH SENSITIVITY)
Troponin I (High Sensitivity): 20 ng/L — ABNORMAL HIGH (ref <18)
Troponin I (High Sensitivity): 17 ng/L (ref <18)

## 2019-11-21 LAB — BRAIN NATRIURETIC PEPTIDE
B Natriuretic Peptide: 284.7 pg/mL — ABNORMAL HIGH (ref 0.0–100.0)
B Natriuretic Peptide: 451.3 pg/mL — ABNORMAL HIGH (ref 0.0–100.0)

## 2019-11-21 LAB — ETHANOL: Alcohol, Ethyl (B): <10 mg/dL (ref <10)

## 2019-11-21 MED ORDER — THIAMINE HCL 100 MG/ML IJ SOLN
100.0000 mg | Freq: Every day | INTRAMUSCULAR | Status: DC
  Administered 2019-11-21: 100 mg via INTRAVENOUS
  Filled 2019-11-21: qty 2

## 2019-11-21 MED ORDER — LORAZEPAM 2 MG/ML IJ SOLN: 0.0000 mg | Freq: Two times a day (BID) | INTRAMUSCULAR | Status: DC

## 2019-11-21 MED ORDER — ONDANSETRON HCL 4 MG/2ML IJ SOLN
4.0000 mg | Freq: Once | INTRAMUSCULAR | Status: AC
  Administered 2019-11-21: 4 mg via INTRAVENOUS
  Filled 2019-11-21: qty 2

## 2019-11-21 MED ORDER — IVABRADINE HCL 5 MG PO TABS: 5.0000 mg | ORAL_TABLET | Freq: Two times a day (BID) | ORAL | 0 refills | Status: DC

## 2019-11-21 MED ORDER — SODIUM CHLORIDE 0.9 % IV BOLUS
500.0000 mL | Freq: Once | INTRAVENOUS | Status: AC
  Administered 2019-11-21: 500 mL via INTRAVENOUS

## 2019-11-21 MED ORDER — LORAZEPAM 2 MG/ML IJ SOLN
0.0000 mg | Freq: Four times a day (QID) | INTRAMUSCULAR | Status: DC
  Administered 2019-11-21: 2 mg via INTRAVENOUS
  Filled 2019-11-21: qty 1

## 2019-11-21 MED ORDER — LORAZEPAM 1 MG PO TABS: 0.0000 mg | ORAL_TABLET | Freq: Four times a day (QID) | ORAL | Status: DC

## 2019-11-21 MED ORDER — SODIUM CHLORIDE 0.9% FLUSH
3.0000 mL | Freq: Once | INTRAVENOUS | Status: AC
  Administered 2019-11-21: 3 mL via INTRAVENOUS

## 2019-11-21 MED ORDER — ONDANSETRON 4 MG PO TBDP
4.0000 mg | ORAL_TABLET | Freq: Three times a day (TID) | ORAL | 0 refills | Status: DC | PRN
Start: 2019-11-21 — End: 2019-12-09

## 2019-11-21 MED ORDER — LORAZEPAM 1 MG PO TABS: 0.0000 mg | ORAL_TABLET | Freq: Two times a day (BID) | ORAL | Status: DC

## 2019-11-21 MED ORDER — THIAMINE HCL 100 MG PO TABS: 100.0000 mg | ORAL_TABLET | Freq: Every day | ORAL | Status: DC

## 2019-11-21 NOTE — ED Notes (Signed)
Patient transported to X-ray 

## 2019-11-21 NOTE — Discharge Instructions (Addendum)
It was recommended that you undergo treatment for your alcohol use and dependence.  Please follow-up with resources given.  This type of recovery often needs a formal plan and a medical support person who can sure it is done safely from a medical perspective.  We recommend you keep your appointment with cardiology on July 15, as planned.  Nausea/vomiting: Use the ondansetron (generic for Zofran) for nausea or vomiting.  This medication may not prevent all vomiting or nausea, but can help facilitate better hydration. Things that can help with nausea/vomiting also include peppermint/menthol candies, vitamin B12, and ginger.  Return to the emergency department for chest pain, shortness of breath, dizziness, passing out, abdominal pain, or any other major concerns.

## 2019-11-21 NOTE — BH Assessment (Signed)
Clinician contacted pt's nurse, Larry Sierras, at ext [phone] in regards to completing pt's [place]. Clinician inquired about pt's BAL being 391; pt's nurse stated that the level was accurate but that one would not know that pt was intoxicated. Clinician informed pt's nurse that completing pt's [place] while pt was so incredibly under the influence, as pt's blood had not been taken until 2123, would be unethical and could lead to pt providing inaccurate information or information that he would not otherwise share. Pt's nurse stated it was up to clinician as to what [facility] staff want to do, and clinician stated that at this time we'd have to wait until more of the alcohol is out of pt's system. Pt's nurse expressed an understanding.   Seth Bake [phone]

## 2019-11-21 NOTE — ED Notes (Signed)
Patient verbalizes understanding of discharge instructions. Opportunity for questioning and answers were provided. Armband removed by staff, pt discharged from ED. Pt. ambulatory and discharged home.  

## 2019-11-21 NOTE — BH Assessment (Addendum)
Comprehensive Clinical Assessment (CCA) Note  11/21/2019 Robert [name] [id number]  Pt is a 39 year old single male who presents to [facility] ED via EMS due to chest pain, SOB, alcohol intoxication, and depressive symptoms. Pt's mother, Robert [name] [phone], participated in assessment with Pt's consent. Pt reports his mother called EMS because he was experiencing chest pain and shortness of breath. Pt's blood alcohol level upon arrival was 391. Per ED record, Pt told EMS he "wishes he could die, feels doomed, does not want to be a burden to family, is in despair." Pt acknowledges he feels depressed stating that "I've always been depressed. That's nothing new." Pt acknowledges symptoms including crying spells, social withdrawal, loss of interest in usual pleasures, fatigue, irritability, decreased concentration, decreased sleep, decreased appetite and feelings of guilt, worthlessness and hopelessness. He denies current suicidal ideation or history of suicide attempts. Protective factors against suicide include good family support, future orientation, therapeutic relationship, no access to firearms, and no prior attempts. Pt reports he stays awake for days at a time and then sleeps for long periods. Pt denies any history of intentional self-injurious behaviors. Pt denies current homicidal ideation or history of violence. Pt denies any history of auditory or visual hallucinations.   Pt reports he drinks 2-3 "airplane bottles" (2 fl.oz.) of vodka daily and says he drank that much today. Explained to Pt that 4 fl oz. of vodka doesn't result in a blood alcohol level of 391. Pt reports he experiences withdrawal symptoms when he stops drinking including sweat, tremors and nausea. Pt denies his alcohol use is a problem and states, "I can quit whenever I want." He reports using marijuana "rarely." He says he has used cocaine in the past but not recently. Pt's urine drug screen is positive for  cannabis.  Pt identifies his medical problems as his primary stressor. He says he is tired "of being stuck at home." He says he is currently unemployed. He lives with his mother and identifies her as his only support. He has no children. He denies history of abuse or trauma. He states he has no mental health providers. He denies history of inpatient psychiatric treatment. He reports he received inpatient treatment for substance use 4-5 years ago at [facility].  Pt's mother says she is concerned because Pt has been told by his physician he needs to stop drinking. She says Pt says he will stop but he doesn't. She is not concerned he will intentionally harm himself but will do so by continuing to drink alcohol.  Pt is casually dressed, alert and oriented x4. Pt speaks in a clear tone, at moderate volume and normal pace. Motor behavior appears normal. Eye contact is good. Pt's mood is depressed and irritable, affect is congruent with mood. Thought process is coherent and relevant. There is no indication Pt is currently responding to internal stimuli or experiencing delusional thought content. Pt says he doesn't want help for his drinking or his depressive symptoms. He expresses little interest in outpatient therapy. Advised Pt how to access affordable treatment with his insurance benefits. Pt said he is tired of being in the ED and wants to go home.    Visit Diagnosis:    F10.20 Alcohol use disorder, Severe F33.2 Major depressive disorder, Recurrent episode, Severe  DISPOSITION: Gave clinical report to Lindon Romp, FNP who said Pt could benefit from alcohol detox treatment. Pt refuses treatment for alcohol use and depressive symptoms. Lindon Romp, FNP agrees Pt does not meet criteria  for involuntary commitment. Notified Mitzi Hansen, RN who said he would relay recommendation to EDP and have her call TTS if she had any questions or concerns.  PHQ9 SCORE ONLY 11/21/2019 11/08/2019  PHQ-9 Total  Score 16 10     CCA Screening, Triage and Referral (STR)  Patient Reported Information How did you hear about [place]? No data recorded Referral name: No data recorded Referral phone number: No data recorded  Whom do you see for routine medical problems? No data recorded Practice/Facility Name: No data recorded Practice/Facility Phone Number: No data recorded Name of Contact: No data recorded Contact Number: No data recorded Contact Fax Number: No data recorded Prescriber Name: No data recorded Prescriber Address (if known): No data recorded  What Is the Reason for Your Visit/Call Today? No data recorded How Long Has This Been Causing You Problems? No data recorded What Do You Feel Would Help You the Most Today? No data recorded  Have You Recently Been in Any Inpatient Treatment (Hospital/Detox/Crisis Center/28-Day Program)? No data recorded Name/Location of Program/Hospital:No data recorded How Long Were You There? No data recorded When Were You Discharged? No data recorded  Have You Ever Received Services From [facility] Before? No data recorded Who Do You See at [facility]? No data recorded  Have You Recently Had Any Thoughts About Hurting Yourself? No data recorded Are You Planning to Commit Suicide/Harm Yourself At This time? No data recorded  Have you Recently Had Thoughts About [place]? No data recorded Explanation: No data recorded  Have You Used Any Alcohol or Drugs in the Past 24 Hours? No data recorded How Long Ago Did You Use Drugs or Alcohol? No data recorded What Did You Use and How Much? No data recorded  Do You Currently Have a Therapist/Psychiatrist? No data recorded Name of Therapist/Psychiatrist: No data recorded  Have You Been Recently Discharged From Any Office Practice or Programs? No data recorded Explanation of Discharge From Practice/Program: No data recorded    CCA Screening Triage Referral Assessment Type of Contact: No data  recorded Is this Initial or Reassessment? No data recorded Date Telepsych consult ordered in [facility]:  No data recorded Time Telepsych consult ordered in [facility]:  No data recorded  Patient Reported Information Reviewed? No data recorded Patient Left Without Being Seen? No data recorded Reason for Not Completing Assessment: No data recorded  Collateral Involvement: No data recorded  Does Patient Have a [place]? No data recorded Name and Contact of Legal Guardian: No data recorded If Minor and Not Living with Parent(s), Who has Custody? No data recorded Is CPS involved or ever been involved? No data recorded Is APS involved or ever been involved? No data recorded  Patient Determined To Be At Risk for Harm To Self or Others Based on Review of Patient Reported Information or Presenting Complaint? No data recorded Method: No data recorded Availability of Means: No data recorded Intent: No data recorded Notification Required: No data recorded Additional Information for Danger to Others Potential: No data recorded Additional Comments for Danger to Others Potential: No data recorded Are There Guns or Other Weapons in Your Home? No data recorded Types of Guns/Weapons: No data recorded Are These Weapons Safely Secured?                            No data recorded Who Could Verify You Are Able To Have These Secured: No data recorded Do You Have  any Outstanding Charges, Pending Court Dates, Parole/Probation? No data recorded Contacted To Inform of Risk of Harm To Self or Others: No data recorded  Location of Assessment: No data recorded  Does Patient Present under Involuntary Commitment? No data recorded IVC Papers Initial File Date: No data recorded  [place] of Residence: No data recorded  Patient Currently Receiving the Following Services: No data recorded  Determination of Need: No data recorded  Options For Referral: No data recorded    CCA  Biopsychosocial  Intake/Chief Complaint:  CCA Intake With Chief Complaint CCA Part Two Date: 11/21/19 CCA Part Two Time: 0301 Chief Complaint/Presenting Problem: Pt presents with chest pain, SOB, alcohol intoxication, depressive symptoms Patient's Currently Reported Symptoms/Problems: Pt reports depressive symptoms related to medical issues Individual's Strengths: NA Individual's Preferences: NA Individual's Abilities: NA Type of Services Patient Feels Are Needed: Pt states he doesn't want treatment for depression or alcohol use Initial Clinical Notes/Concerns: Pt focused on being discharged from ED  Mental Health Symptoms Depression:  Depression: Change in energy/activity, Difficulty Concentrating, Fatigue, Increase/decrease in appetite, Irritability, Sleep (too much or little), Worthlessness, Duration of symptoms less than two weeks  Mania:  Mania: Irritability  Anxiety:   Anxiety: Fatigue, Irritability, Sleep  Psychosis:  Psychosis: None  Trauma:  Trauma: None  Obsessions:  Obsessions: None  Compulsions:  Compulsions: None  Inattention:  Inattention: None  Hyperactivity/Impulsivity:  Hyperactivity/Impulsivity: N/A  Oppositional/Defiant Behaviors:  Oppositional/Defiant Behaviors: None  Emotional Irregularity:  Emotional Irregularity: None  Other Mood/Personality Symptoms:      Mental Status Exam Appearance and self-care  Stature:  Stature: Average  Weight:  Weight: Average weight  Clothing:  Clothing: Casual  Grooming:  Grooming: Normal  Cosmetic use:  Cosmetic Use: None  Posture/gait:  Posture/Gait: Normal  Motor activity:  Motor Activity: Not Remarkable  Sensorium  Attention:  Attention: Normal  Concentration:  Concentration: Normal  Orientation:  Orientation: X5  Recall/memory:  Recall/Memory: Normal  Affect and Mood  Affect:  Affect: Depressed  Mood:  Mood: Irritable, Depressed  Relating  Eye contact:  Eye Contact: Normal  Facial expression:  Facial Expression:  Responsive  Attitude toward examiner:  Attitude Toward Examiner: Cooperative, Guarded  Thought and Language  Speech flow: Speech Flow: Normal  Thought content:  Thought Content: Appropriate to Mood and Circumstances  Preoccupation:  Preoccupations: None  Hallucinations:  Hallucinations: None  Organization:     Transport planner of Knowledge:  Fund of Knowledge: Average  Intelligence:  Intelligence: Average  Abstraction:  Abstraction: Normal  Judgement:  Judgement: Fair  Art therapist:  Reality Testing: Adequate  Insight:  Insight: Lacking  Decision Making:  Decision Making: Normal  Social Functioning  Social Maturity:  Social Maturity: Isolates  Social Judgement:  Social Judgement: Normal  Stress  Stressors:  Stressors: Illness  Coping Ability:  Coping Ability: Deficient supports  Skill Deficits:  Skill Deficits: Self-care  Supports:  Supports: Family     Religion:    Leisure/Recreation: Leisure / Recreation Do You Have Hobbies?: No  Exercise/Diet: Exercise/Diet Do You Exercise?: No Have You Gained or Lost A Significant Amount of Weight in the Past Six Months?: No Do You Follow a Special Diet?: No Do You Have Any Trouble Sleeping?: Yes Explanation of Sleeping Difficulties: Awake for days, then sleeps all day   CCA Employment/Education  Employment/Work Situation: Employment / Work Situation Employment situation: Unemployed Patient's job has been impacted by current illness: Yes Describe how patient's job has been impacted: Pt unable to work due  to medical problems Has patient ever been in the [employer]?: No  Education: Education Is Patient Currently Attending School?: No   CCA Family/Childhood History  Family and Relationship History: Family history Marital status: Single Does patient have children?: No  Childhood History:  Childhood History By whom was/is the patient raised?: Mother Did patient suffer any verbal/emotional/physical/sexual  abuse as a child?: No Did patient suffer from severe childhood neglect?: No Has patient ever been sexually abused/assaulted/raped as an adolescent or adult?: No Was the patient ever a victim of a crime or a disaster?: No Witnessed domestic violence?: No Has patient been affected by domestic violence as an adult?: No  Child/Adolescent Assessment:     CCA Substance Use  Alcohol/Drug Use: Alcohol / Drug Use Pain Medications: Denies abuse Prescriptions: Denies abuse Over the Counter: Denies abuse History of alcohol / drug use?: Yes Longest period of sobriety (when/how long): Unknown Negative Consequences of Use: Personal relationships Withdrawal Symptoms: Sweats, Tremors, Nausea / Vomiting Substance #1 Name of Substance 1: Alcohol 1 - Age of First Use: Adolescent 1 - Amount (size/oz): "2-3 airplane bottles" of vodka 1 - Frequency: Daily 1 - Duration: Ongoing 1 - Last Use / Amount: 11/20/2019 Substance #2 Name of Substance 2: Marijuana 2 - Age of First Use: Adolescent 2 - Amount (size/oz): Small amount 2 - Frequency: "not often at all" 2 - Duration: Ongoing 2 - Last Use / Amount: 3 days ago                     ASAM's:  Six Dimensions of Multidimensional Assessment  Dimension 1:  Acute Intoxication and/or Withdrawal Potential:      Dimension 2:  Biomedical Conditions and Complications:      Dimension 3:  Emotional, Behavioral, or Cognitive Conditions and Complications:     Dimension 4:  Readiness to Change:     Dimension 5:  Relapse, Continued use, or Continued Problem Potential:     Dimension 6:  Recovery/Living Environment:     ASAM Severity Score:    ASAM Recommended Level of Treatment:     Substance use Disorder (SUD) Substance Use Disorder (SUD)  Checklist Symptoms of Substance Use: Continued use despite having a persistent/recurrent physical/psychological problem caused/exacerbated by use, Continued use despite persistent or recurrent social,  interpersonal problems, caused or exacerbated by use, Evidence of tolerance, Presence of craving or strong urge to use, Social, occupational, recreational activities given up or reduced due to use, Substance(s) often taken in larger amounts or over longer times than was intended  Recommendations for Services/Supports/Treatments: Recommendations for Services/Supports/Treatments Recommendations For Services/Supports/Treatments: Inpatient Hospitalization  DSM5 Diagnoses: Patient Active Problem List   Diagnosis Date Noted  . AKI (acute kidney injury) ([place]) 10/30/2019  . Gastritis 10/30/2019  . Elevated INR 10/30/2019  . 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[place])   . Acute heart failure ([place]) 10/05/2019  . Pulmonary embolism ([place]) 10/04/2019  . Acute pulmonary embolism ([place]) 10/04/2019  . Chronic systolic CHF (congestive heart failure) ([place])   . Inappropriate sinus tachycardia   . Chest pain 11/03/2013  . Tobacco abuse 11/03/2013    Patient Centered Plan: Patient is on the following Treatment Plan(s):     Referrals to Alternative Service(s): Referred to Alternative Service(s):   Place:   Date:   Time:    Referred to Alternative Service(s):   Place:   Date:   Time:    Referred to Alternative Service(s):   Place:   Date:  Time:    Referred to Alternative Service(s):   Place:   Date:   Time:      Evelena Peat, [facility], [facility] Triage Specialist [phone]   Anson Fret, Orpah Greek

## 2019-11-21 NOTE — ED Triage Notes (Signed)
Patient complains of ongoing CP after being seen and evaluated in ED last night for same and discharged to follow up with cardiology. Patient alert and oriented, NAD. To see cardiology on Thursday

## 2019-11-21 NOTE — ED Provider Notes (Signed)
Robert [name] is a 39 y.o. male, with a history of cocaine and alcohol induced cardiomyopathy, CHF with EF less than 20%, presenting to the ED with chest pain. Patient tells me around 6 AM this morning, he began to have nausea and vomiting.  He then began to have intermittent episodes of chest pain.  The earlier episodes of chest pain lasted less than 10 to 15 minutes at a time.  He had intermittent feelings of flushing and diaphoresis. While in the waiting room here, he states he had an episode of chest pain that lasted approximately an hour.  As a timeline, patient arrived in the ED around 9:30 AM and was shown back to a room a little after 1 PM.   Central chest, first radiating to the right side of the chest, then radiating to the left side of the chest, pressure, moderate in intensity, accompanied by nausea, shortness of breath, and diaphoresis.  His last drink of alcohol was yesterday midday.  He states he has not had cocaine for several months.   He additionally tells me that he was prescribed Corlanor at his last cardiology visit.  He was given sample bottles and when those ran out, he was supposed to receive the prescribed medications in the mail.  However, he has 1 pill left from the sample bottles and his prescriptions have not yet arrived. Denies measured fever, cough, abdominal pain, other shortness of breath, diarrhea, hematochezia/melena, syncope, orthopnea, lower extremity edema/pain, or any other complaints.   HPI from [place], [place]: "Robert [name] is a 39 y.o. male.  59 y. o male with a PMH of CHF, Asthma presents to the ED with a chief complaint of chest pain x disposition. Patient was evaluated in the ED last night, had a negative cardiac workup, he returns today with 4 episodes of vomiting. Reports pain is now in the subternal region somewhat relieve by vomiting. He reports running out of his Corlanor, but took the rest of his medication. He reports 4-5 episodes of  "little bit of blood emesis". He is currently on blood thinners. He does have a history of alcohol use, reports his last alcoholic drink was yesterday, he had 5-6 mini bottles of liquor. Reports "I feel like I am having withdrawals"."  Past Medical History:  Diagnosis Date  . Alcohol use   . Asthma   . CHF (congestive heart failure) (HCC)      Physical Exam  BP 127/85   Pulse 76   Temp 98.3 F (36.8 C) (Oral)   Resp 16   SpO2 100%   Physical Exam Vitals and nursing note reviewed.  Constitutional:      General: He is not in acute distress.    Appearance: He is well-developed. He is not diaphoretic.  HENT:     Head: Normocephalic and atraumatic.     Mouth/Throat:     Mouth: Mucous membranes are moist.     Pharynx: Oropharynx is clear.  Eyes:     Conjunctiva/sclera: Conjunctivae normal.  Cardiovascular:     Rate and Rhythm: Regular rhythm. Tachycardia present.     Pulses: Normal pulses.          Radial pulses are 2+ on the right side and 2+ on the left side.       Posterior tibial pulses are 2+ on the right side and 2+ on the left side.     Heart sounds: Normal heart sounds.     Comments: Tactile temperature in the  extremities appropriate and equal bilaterally. Mildly tachycardic around 104 bpm. Pulmonary:     Effort: Pulmonary effort is normal. No respiratory distress.     Breath sounds: Normal breath sounds.  Abdominal:     Palpations: Abdomen is soft.     Tenderness: There is no abdominal tenderness. There is no guarding.  Musculoskeletal:     Cervical back: Neck supple.     Right lower leg: No edema.     Left lower leg: No edema.  Lymphadenopathy:     Cervical: No cervical adenopathy.  Skin:    General: Skin is warm and dry.  Neurological:     Mental Status: He is alert.     Comments: Attentive, sitting upright.  No intention tremor or at rest.  Psychiatric:        Mood and Affect: Mood and affect normal.        Speech: Speech normal.        Behavior:  Behavior normal.     Comments: No noted anxiety.  Patient appears calm.     ED Course/Procedures     Procedures   Abnormal Labs Reviewed  BASIC METABOLIC PANEL - Abnormal; Notable for the following components:      Result Value   Chloride 95 (*)    Glucose, Bld 119 (*)    BUN 31 (*)    Creatinine, Ser 1.50 (*)    GFR calc non Af Amer 58 (*)    Anion gap 16 (*)    All other components within normal limits  BRAIN NATRIURETIC PEPTIDE - Abnormal; Notable for the following components:   B Natriuretic Peptide 451.3 (*)    All other components within normal limits  HEPATIC FUNCTION PANEL - Abnormal; Notable for the following components:   Total Protein 8.3 (*)    Indirect Bilirubin 1.0 (*)    All other components within normal limits  RAPID URINE DRUG SCREEN, HOSP PERFORMED - Abnormal; Notable for the following components:   Benzodiazepines POSITIVE (*)    Tetrahydrocannabinol POSITIVE (*)    All other components within normal limits  TROPONIN I (HIGH SENSITIVITY) - Abnormal; Notable for the following components:   Troponin I (High Sensitivity) 20 (*)    All other components within normal limits    DG Chest 2 View  Result Date: 11/21/2019 CLINICAL DATA:  Chest pain. EXAM: CHEST - 2 VIEW COMPARISON:  November 20, 2019 FINDINGS: The cardiac silhouette is mildly enlarged and stable. The hila and mediastinum are unremarkable. No pneumothorax. No nodules or masses. No focal infiltrates. IMPRESSION: Stable mild cardiomegaly.  No acute abnormalities. Electronically Signed   By: Dorise Bullion III M.D   On: 11/21/2019 17:35   DG Chest 2 View  Result Date: 11/20/2019 CLINICAL DATA:  Chest pain and shortness of breath EXAM: CHEST - 2 VIEW COMPARISON:  Radiograph 11/14/2019, CT 10/04/2019 FINDINGS: No consolidation, features of edema, pneumothorax, or effusion. Cardiomegaly is similar to prior. Remaining cardiomediastinal contours are unremarkable. No acute osseous or soft tissue abnormality.  IMPRESSION: 1. No acute cardiopulmonary abnormality. 2. Stable cardiomegaly. Electronically Signed   By: Lovena Le M.D.   On: 11/20/2019 21:52   DG Chest 2 View  Result Date: 11/14/2019 CLINICAL DATA:  Chest pain and shortness of breath. EXAM: CHEST - 2 VIEW COMPARISON:  10/30/2019 FINDINGS: Lungs are adequately inflated without consolidation or effusion. Flattening of the hemidiaphragms on the lateral film. Moderate stable cardiomegaly. Remainder the exam is unchanged. IMPRESSION: No acute cardiopulmonary disease. Moderate stable  cardiomegaly. Electronically Signed   By: Marin Olp M.D.   On: 11/14/2019 16:39   DG Chest 2 View  Result Date: 10/30/2019 CLINICAL DATA:  Chest pain, shortness of breath. History of congestive heart failure. EXAM: CHEST - 2 VIEW COMPARISON:  Chest radiograph dated 10/24/2019 FINDINGS: The cardiac silhouette is enlarged and there is suggestion of a pericardial effusion. Both lungs are clear. The visualized skeletal structures are unremarkable. IMPRESSION: Enlarged cardiac silhouette with suggestion of a pericardial effusion. Clear lungs. Electronically Signed   By: Zerita Boers M.D.   On: 10/30/2019 18:39   DG Chest 2 View  Result Date: 10/24/2019 CLINICAL DATA:  Chest pain EXAM: CHEST - 2 VIEW COMPARISON:  10/04/2019 FINDINGS: Cardiomegaly. Lungs clear. No effusions or edema. No acute bony abnormality. IMPRESSION: Cardiomegaly.  No active disease. Electronically Signed   By: Rolm Baptise M.D.   On: 10/24/2019 23:05     ED ECG REPORT   Date: 11/21/2019  Rate: 90  Rhythm: normal sinus rhythm  QRS Axis: normal  Intervals: normal  ST/T Wave abnormalities: nonspecific T wave changes  Conduction Disutrbances:none  Narrative Interpretation:   Old EKG Reviewed: unchanged  I have personally reviewed the EKG tracing and agree with the computerized printout as noted.   MDM    Clinical Course as of Nov 20 1840  Sun Nov 21, 5110  853 39 year old male with  known cardiomyopathy complaining of chest pain.  He has had multiple ED visits for same.  When asked what he thinks is causing his pain he said his alcohol.  He said he is interested in stopping alcohol.   [MB]  1724 Spoke with Dr. Rodman Key, Cardiology Fellow.  We discussed the patient's symptoms earlier in the day with context given to patient's medical history, lab abnormalities, changes in BNP from yesterday to today. We also reviewed the patient's recent cardiac cath. He states patient can follow-up in the office, as planned, on July 15.   [SJ]    Clinical Course User Index [MB] Hayden Rasmussen, MD [SJ] Lorayne Bender, PA-C   Patient care handoff report received from [facility], [place]. Plan: Lab work pending.  Reevaluate patient.   Patient presents for evaluation following episodes of chest pain as well as nausea and vomiting today. He has not had any such symptoms since he was shown back to a room and certainly not while under my care. Last echo, performed October 12, 2019, shows LVEF of less than 20% with global hypokinesis of the left ventricle. Cardiac cath performed May 2021 shows normal coronary arteries.  The mild bit of tachycardia I noted for the patient may be the beginnings of mild alcohol withdrawal, however, he continues to deny other symptoms. We discussed this as well as options for responding to it. Patient opted for discharge and oral hydration at home.   Patient has no current symptoms, delta troponins flat, no acute EKG abnormalities, chest x-ray without acute abnormalities, and he has follow-up with cardiology already scheduled for this coming week. The patient was given instructions for home care as well as return precautions. Patient voices understanding of these instructions, accepts the plan, and is comfortable with discharge.  I reviewed and interpreted the patient's labs and radiological studies.   Since patient states he is set to run out of his Corlanor he was  taking from the sample bottles from the cardiologist, I will prescribe some additional bridge doses for the next several days until patient's cardiology appointment so he  does not have to go without this medication.   Findings and plan of care discussed with Gara Kroner, MD.   Vitals:   11/21/19 0946 11/21/19 1142 11/21/19 1328 11/21/19 1330  BP: 125/86 (!) 130/92 134/81 127/85  Pulse: 92 79 87 76  Resp: 18 17  16   Temp: 98.2 F (36.8 C)  98.3 F (36.8 C)   TempSrc: Oral  Oral   SpO2: 97% 100% 98% 100%    Vitals:   11/21/19 1328 11/21/19 1330 11/21/19 1710 11/21/19 1710  BP: 134/81 127/85  125/89  Pulse: 87 76  (!) 101  Resp:  16  20  Temp: 98.3 F (36.8 C)     TempSrc: Oral     SpO2: 98% 100%  100%  Weight:   83.9 kg   Height:   5\' 10"  (1.778 m)       Layla Maw 11/21/19 1844    LongWonda Olds, MD 11/27/19 2040

## 2019-11-21 NOTE — Discharge Instructions (Signed)
Please follow up with your cardiologist and primary care provider Return to the ED for any worsening symptoms

## 2019-11-21 NOTE — ED Notes (Signed)
Patient given discharge instructions. Questions were answered. Patient verbalized understanding of discharge instructions and care at home.  

## 2019-11-21 NOTE — ED Provider Notes (Signed)
[facility] EMERGENCY DEPARTMENT Provider Note   CSN: [id number] Arrival date & time: 11/21/19  0300     History No chief complaint on file.   Robert [name] is a 39 y.o. male.  62 y. o male with a PMH of CHF, Asthma presents to the ED with a chief complaint of chest pain x disposition. Patient was evaluated in the ED last night, had a negative cardiac workup, he returns today with 4 episodes of vomiting. Reports pain is now in the subternal region somewhat relieve by vomiting. He reports running out of his Corlanor, but took the rest of his medication. He reports 4-5 episodes of "little bit of blood emesis". He is currently on blood thinners. He does have a history of alcohol use, reports his last alcoholic drink was yesterday, he had 5-6 mini bottles of liquor. Reports "I feel like I am having withdrawals".       The history is provided by the patient and medical records.  Chest Pain Pain location:  Substernal area Pain quality: sharp   Pain radiates to:  Does not radiate Pain severity:  Moderate Onset quality:  Sudden Duration:  2 days Timing:  Intermittent Progression:  Resolved Chronicity:  Recurrent Worsened by:  Nothing Associated symptoms: shortness of breath   Associated symptoms: no abdominal pain, no back pain, no fever, no headache, no nausea and no vomiting        Past Medical History:  Diagnosis Date  . Alcohol use   . Asthma   . CHF (congestive heart failure) [facility])     Patient Active Problem List   Diagnosis Date Noted  . AKI (acute kidney injury) ([place]) 10/30/2019  . Gastritis 10/30/2019  . Elevated INR 10/30/2019  . 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[place])   . Acute heart failure ([place]) 10/05/2019  . Pulmonary embolism ([place]) 10/04/2019  . Acute pulmonary embolism ([place]) 10/04/2019  . Chronic systolic CHF (congestive heart failure) ([place])   . Inappropriate sinus tachycardia   . Chest pain 11/03/2013  .  Tobacco abuse 11/03/2013    Past Surgical History:  Procedure Laterality Date  . ESOPHAGOGASTRODUODENOSCOPY (EGD) WITH PROPOFOL N/A 11/01/2019   Procedure: ESOPHAGOGASTRODUODENOSCOPY (EGD) WITH PROPOFOL;  Surgeon: [name], MD;  Location: [facility] ENDOSCOPY;  Service: Endoscopy;  Laterality: N/A;  . RIGHT/LEFT HEART CATH AND CORONARY ANGIOGRAPHY N/A 10/06/2019   Procedure: RIGHT/LEFT HEART CATH AND CORONARY ANGIOGRAPHY;  Surgeon: [name], MD;  Location: [facility];  Service: Cardiovascular;  Laterality: N/A;  . WRIST SURGERY         Family History  Problem Relation Age of Onset  . Heart failure Father   . Diabetes Father   . Kidney disease Father     Social History   Tobacco Use  . Smoking status: Current Every Day Smoker    Packs/day: 0.50    Types: Cigarettes  . Smokeless tobacco: Never Used  Vaping Use  . Vaping Use: Former  Substance Use Topics  . Alcohol use: Yes  . Drug use: Yes    Types: Marijuana    Home Medications Prior to Admission medications   Medication Sig Start Date End Date Taking? Authorizing Provider  digoxin (LANOXIN) 0.125 MG tablet Take 1 tablet (0.125 mg total) by mouth daily. 10/14/19  Yes Lyda Jester M, PA-C  folic acid (FOLVITE) 1 MG tablet Take 1 tablet (1 mg total) by mouth daily. 11/08/19  Yes Clegg, Amy D, NP  furosemide (LASIX) 80 MG tablet Take 80 mg by mouth daily.    Yes [provider]  ivabradine (CORLANOR) 5 MG TABS tablet Take 1 tablet (5 mg total) by mouth 2 (two) times daily with a meal. 10/13/19  Yes Lyda Jester M, PA-C  losartan (COZAAR) 25 MG tablet Take 0.5 tablets (12.5 mg total) by mouth 2 (two) times daily. 10/13/19  Yes Lyda Jester M, PA-C  Magnesium Oxide 200 MG TABS Take 1 tablet (200 mg total) by mouth 2 (two) times daily. 10/28/19  Yes Clegg, Amy D, NP  pantoprazole (PROTONIX) 40 MG tablet Take 1 tablet (40 mg total) by mouth 2 (two) times daily. 11/08/19  Yes Clegg, Amy D, NP   rivaroxaban (XARELTO) 20 MG TABS tablet Take 1 tablet (20 mg total) by mouth daily. 11/03/19  Yes Thurnell Lose, MD  spironolactone (ALDACTONE) 25 MG tablet Take 0.5 tablets (12.5 mg total) by mouth 2 (two) times daily. 11/03/19  Yes Lyda Jester M, PA-C  thiamine 100 MG tablet Take 1 tablet (100 mg total) by mouth daily. 11/03/19  Yes Thurnell Lose, MD  furosemide (LASIX) 40 MG tablet Take 2 tablets (80 mg total) by mouth daily. Patient not taking: Reported on 11/08/2019 11/03/19   Lyda Jester M, PA-C  sertraline (ZOLOFT) 25 MG tablet Take 0.5 tablets (12.5 mg total) by mouth daily. Patient not taking: Reported on 11/21/2019 11/08/19 11/07/20  Conrad Hackberry, NP    Allergies    Bactrim [sulfamethoxazole-trimethoprim]  Review of Systems   Review of Systems  Constitutional: Negative for chills and fever.  HENT: Negative for sore throat.   Respiratory: Positive for shortness of breath.   Cardiovascular: Positive for chest pain.  Gastrointestinal: Negative for abdominal pain, nausea and vomiting.  Genitourinary: Negative for flank pain.  Musculoskeletal: Negative for back pain.  Skin: Negative for pallor and wound.  Neurological: Negative for light-headedness and headaches.  All other systems reviewed and are negative.   Physical Exam Updated Vital Signs BP 127/85   Pulse 76   Temp 98.3 F (36.8 C) (Oral)   Resp 16   SpO2 100%   Physical Exam  ED Results / Procedures / Treatments   Labs (all labs ordered are listed, but only abnormal results are displayed) Labs Reviewed  BASIC METABOLIC PANEL - Abnormal; Notable for the following components:      Result Value   Chloride 95 (*)    Glucose, Bld 119 (*)    BUN 31 (*)    Creatinine, Ser 1.50 (*)    GFR calc non Af Amer 58 (*)    Anion gap 16 (*)    All other components within normal limits  TROPONIN I (HIGH SENSITIVITY) - Abnormal; Notable for the following components:   Troponin I (High Sensitivity) 20 (*)     All other components within normal limits  CBC  ETHANOL  BRAIN NATRIURETIC PEPTIDE  HEPATIC FUNCTION PANEL  TROPONIN I (HIGH SENSITIVITY)    EKG None  Radiology DG Chest 2 View  Result Date: 11/20/2019 CLINICAL DATA:  Chest pain and shortness of breath EXAM: CHEST - 2 VIEW COMPARISON:  Radiograph 11/14/2019, CT 10/04/2019 FINDINGS: No consolidation, features of edema, pneumothorax, or effusion. Cardiomegaly is similar to prior. Remaining cardiomediastinal contours are unremarkable. No acute osseous or soft tissue abnormality. IMPRESSION: 1. No acute cardiopulmonary abnormality. 2. Stable cardiomegaly. Electronically Signed   By: Lovena Le M.D.   On: 11/20/2019 21:52    Procedures Procedures (including critical  care time)  Medications Ordered in ED Medications  LORazepam (ATIVAN) injection 0-4 mg (2 mg Intravenous Given 11/21/19 1514)    Or  LORazepam (ATIVAN) tablet 0-4 mg ( Oral See Alternative 11/21/19 1514)  LORazepam (ATIVAN) injection 0-4 mg (has no administration in time range)    Or  LORazepam (ATIVAN) tablet 0-4 mg (has no administration in time range)  thiamine tablet 100 mg (has no administration in time range)    Or  thiamine (B-1) injection 100 mg (has no administration in time range)  sodium chloride flush (NS) 0.9 % injection 3 mL (3 mLs Intravenous Given 11/21/19 1514)  sodium chloride 0.9 % bolus 500 mL (500 mLs Intravenous New Bag/Given 11/21/19 1518)  ondansetron (ZOFRAN) injection 4 mg (4 mg Intravenous Given 11/21/19 1513)    ED Course  I have reviewed the triage vital signs and the nursing notes.  Pertinent labs & imaging results that were available during my care of the patient were reviewed by me and considered in my medical decision making (see chart for details).  Clinical Course as of Nov 20 1537  Sun Nov 20, 5932  1132 39 year old male with known cardiomyopathy complaining of chest pain.  He has had multiple ED visits for same.  When asked what he  thinks is causing his pain he said his alcohol.  He said he is interested in stopping alcohol.   [MB]    Clinical Course User Index [MB] Hayden Rasmussen, MD   MDM Rules/Calculators/A&P  Patient with a PMH of alcohol abuse presents to the ED with a chief complaint of vomiting and and chest pain. Evaluated in the ED yesterday for the same complaint and disposition home after reassuring workup. Today he endorses 4-5 episodes of vomiting, reports alcohol consumption of 3-4 mini bottles of liquor yesterday.  Reports that he feels that he is somewhat in withdrawals.  Patient is a chest pain along the substernal region without any radiation.  Have a previous history of cardiomyopathy currently on Ivabradine 5mg  but reports he ran out of this medication.   Interpretation of his labs by me revealed a CBC which is unremarkable, BMP without any electrolyte derangement, creatinine level slightly elevated consistent with his previous visit.  Troponin is trending down.  BNP along with ethanol level were added at this time. He was provided with Ativan. Patient is closely followed by cardiology, doe shave an appt with cards on Thursday but still has labs pending.   Patient care signed out to [facility] J. PA-C pending labs.    Portions of this note were generated with [employer]. Dictation errors may occur despite best attempts at proofreading.  Final Clinical Impression(s) / ED Diagnoses Final diagnoses:  Chest pain, unspecified type    Rx / DC Orders ED Discharge Orders    None       Janeece Fitting, Hershal Coria 11/21/19 1539    Hayden Rasmussen, MD 11/22/19 1824

## 2019-11-22 LAB — DRUG PROFILE, UR, 9 DRUGS (LABCORP)
Amphetamines, Urine: NEGATIVE ng/mL
Barbiturate, Ur: NEGATIVE ng/mL
Benzodiazepine Quant, Ur: NEGATIVE ng/mL
Cannabinoid Quant, Ur: POSITIVE ng/mL — AB
Cocaine (Metab.): NEGATIVE ng/mL
Methadone Screen, Urine: NEGATIVE ng/mL
Opiate Quant, Ur: NEGATIVE ng/mL
Phencyclidine, Ur: NEGATIVE ng/mL
Propoxyphene, Urine: NEGATIVE ng/mL

## 2019-11-25 ENCOUNTER — Other Ambulatory Visit: Payer: Self-pay

## 2019-11-25 ENCOUNTER — Encounter (HOSPITAL_COMMUNITY): Payer: Self-pay

## 2019-11-25 ENCOUNTER — Ambulatory Visit (HOSPITAL_COMMUNITY)
Admission: RE | Admit: 2019-11-25 | Discharge: 2019-11-25 | Disposition: A | Discharge status: Home / Self Care | Payer: BC Managed Care – PPO | Source: Ambulatory Visit | Attending: Cardiology | Admitting: Cardiology

## 2019-11-25 ENCOUNTER — Other Ambulatory Visit (HOSPITAL_COMMUNITY): Payer: Self-pay

## 2019-11-25 VITALS — BP 140/94 | HR 96 | Wt 186.2 lb

## 2019-11-25 DIAGNOSIS — Z720 Tobacco use: Secondary | ICD-10-CM | POA: Diagnosis not present

## 2019-11-25 DIAGNOSIS — K2970 Gastritis, unspecified, without bleeding: Secondary | ICD-10-CM | POA: Diagnosis not present

## 2019-11-25 DIAGNOSIS — F1411 Cocaine abuse, in remission: Secondary | ICD-10-CM | POA: Insufficient documentation

## 2019-11-25 DIAGNOSIS — I5082 Biventricular heart failure: Secondary | ICD-10-CM | POA: Diagnosis not present

## 2019-11-25 DIAGNOSIS — F17210 Nicotine dependence, cigarettes, uncomplicated: Secondary | ICD-10-CM | POA: Insufficient documentation

## 2019-11-25 DIAGNOSIS — Z7901 Long term (current) use of anticoagulants: Secondary | ICD-10-CM | POA: Insufficient documentation

## 2019-11-25 DIAGNOSIS — I428 Other cardiomyopathies: Secondary | ICD-10-CM | POA: Diagnosis not present

## 2019-11-25 DIAGNOSIS — J45909 Unspecified asthma, uncomplicated: Secondary | ICD-10-CM | POA: Insufficient documentation

## 2019-11-25 DIAGNOSIS — F1410 Cocaine abuse, uncomplicated: Secondary | ICD-10-CM

## 2019-11-25 DIAGNOSIS — I2699 Other pulmonary embolism without acute cor pulmonale: Secondary | ICD-10-CM | POA: Insufficient documentation

## 2019-11-25 DIAGNOSIS — Z8249 Family history of ischemic heart disease and other diseases of the circulatory system: Secondary | ICD-10-CM | POA: Diagnosis not present

## 2019-11-25 DIAGNOSIS — I313 Pericardial effusion (noninflammatory): Secondary | ICD-10-CM | POA: Diagnosis not present

## 2019-11-25 DIAGNOSIS — F1010 Alcohol abuse, uncomplicated: Secondary | ICD-10-CM

## 2019-11-25 DIAGNOSIS — I5022 Chronic systolic (congestive) heart failure: Secondary | ICD-10-CM | POA: Diagnosis present

## 2019-11-25 DIAGNOSIS — Z79899 Other long term (current) drug therapy: Secondary | ICD-10-CM | POA: Insufficient documentation

## 2019-11-25 MED ORDER — SERTRALINE HCL 25 MG PO TABS: 25.0000 mg | ORAL_TABLET | Freq: Every day | ORAL | 2 refills | Status: DC

## 2019-11-25 MED ORDER — ENTRESTO 24-26 MG PO TABS: 1.0000 | ORAL_TABLET | Freq: Two times a day (BID) | ORAL | 11 refills | Status: DC

## 2019-11-25 MED ORDER — FUROSEMIDE 40 MG PO TABS: 40.0000 mg | ORAL_TABLET | Freq: Every day | ORAL | 3 refills | Status: DC

## 2019-11-25 NOTE — Progress Notes (Signed)
PCP: Primary Cardiologist: Dr Haroldine Laws    Reason for F/u: F/u for Chronic Systolic Heart Failure   HPI: 39 y/o male with h/o heavy ETOH and recent cocaine use, PE, recently diagnosed biventricular heart failure.   Admitted with acute onset HF with severe biventricular failure. EF 15%. RV down. He was drinking at least 2 beers every night and about 12-24 over the weekend. Also drinking 1/5 of liqour every other day. Cath shown normal coronaries. Elevated filling pressures with normal output. Placed on milrinone and diuresed with IV lasix. As he improved milrinone was weaned off . Transitioned oral lasix. cMRI shows Noncompaction. Significant daily ETOH intake. CT shows small LLL PE. Placed on xarelto. Started on HF meds.  Discharge weight 178 pounds.   Had initial post hospital f/u 6/14 and was volume overloaded. Given IV Lasix in clinic and home lasix increased to 80 mg daily.   Recently readmitted by [facility] and just discharged yesterday. Admitted for mild acute blood loss related anemia due to ongoing most likely upper GI bleed caused from gastritis in the setting of Xarelto use. This likely has been brought on by excessive alcohol intake. He was treated with bowel rest, IV fluids and IV PPI. He was seen by GI and underwent EGD showing no active bleeding but evidence of some gastritis.  He was switched to oral PPI twice daily, H&H remained stable and he did not require any transfusions.  He was instructed to continue Xarelto.   As noted above, he was just discharged yesterday. Prior to d/c, labs yesterday showed elevated BNP 1,196 (up on 859, the day prior).  SCr 1.33 c/w baseline. K 4.0. Hgb 11.  Pt reports that while on bowel rest, he was given IVFs and states that his PO lasix was reduced to 40 mg.   Since the last visit he was evaluated in the ED 7/4, 7/10, and 11/21/19.  Chest pain was reported. HS Trop was low on all 3 occasions. He has started drinking alcohol again and was thinking he was  going to go withdrawl from alcohol.  Today he returns for HF follow up.Overall feeling anxious. Says he  Denies SOB/PND/Orthopnea. Appetite ok. No fever or chills.  Drinking alcohol again.  Weight at home 186 pounds. Taking all medications. Smoking 2-3 cigarettes per day.   ECHO 10/12/2019 Pericardial effusion remains moderate in size. No tamponade physiology. ECHO 10/05/2019 EF <20%  Prominent trabeculation. Grade III DD  [facility] 10/05/2019 showed normal coronaries, elevated filling pressures with normal output.  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  SH:  Social History   Socioeconomic History  . Marital status: Single    Spouse name: Not on file  . Number of children: Not on file  . Years of education: Not on file  . Highest education level: Not on file  Occupational History  . Not on file  Tobacco Use  . Smoking status: Current Every Day Smoker    Packs/day: 0.50    Types: Cigarettes  . Smokeless tobacco: Never Used  Vaping Use  . Vaping Use: Former  Substance and Sexual Activity  . Alcohol use: Yes  . Drug use: Yes    Types: Marijuana  . Sexual activity: Not on file  Other Topics Concern  . Not on file  Social History Narrative  . Not on file   Social Determinants of Health  Financial Resource Strain: High Risk  . Difficulty of Paying Living Expenses: Very hard  Food Insecurity: Food Insecurity Present  . Worried About Charity fundraiser in the Last Year: Sometimes true  . Ran Out of Food in the Last Year: Sometimes true  Transportation Needs:   . Lack of Transportation (Medical):   [name] Lack of Transportation (Non-Medical):   Physical Activity:   . Days of Exercise per Week:   . Minutes of Exercise per Session:   Stress:   . Feeling of Stress :   Social Connections:   . Frequency of Communication with Friends and Family:   . Frequency of  Social Gatherings with Friends and Family:   . Attends Religious Services:   . Active Member of Clubs or Organizations:   . Attends Archivist Meetings:   [name] Marital Status:   Intimate Partner Violence:   . Fear of Current or Ex-Partner:   . Emotionally Abused:   [name] Physically Abused:   . Sexually Abused:     FH:  Family History  Problem Relation Age of Onset  . Heart failure Father   . Diabetes Father   . Kidney disease Father     Past Medical History:  Diagnosis Date  . Alcohol use   . Asthma   . CHF (congestive heart failure) (HCC)     Current Outpatient Medications  Medication Sig Dispense Refill  . digoxin (LANOXIN) 0.125 MG tablet Take 1 tablet (0.125 mg total) by mouth daily. 30 tablet 5  . folic acid (FOLVITE) 1 MG tablet Take 1 tablet (1 mg total) by mouth daily. 30 tablet 3  . furosemide (LASIX) 40 MG tablet Take 2 tablets (80 mg total) by mouth daily. 60 tablet 3  . ivabradine (CORLANOR) 5 MG TABS tablet Take 1 tablet (5 mg total) by mouth 2 (two) times daily with a meal. 60 tablet 5  . losartan (COZAAR) 25 MG tablet Take 0.5 tablets (12.5 mg total) by mouth 2 (two) times daily. 30 tablet 5  . Magnesium Oxide 200 MG TABS Take 1 tablet (200 mg total) by mouth 2 (two) times daily. 60 tablet 11  . ondansetron (ZOFRAN ODT) 4 MG disintegrating tablet Take 1 tablet (4 mg total) by mouth every 8 (eight) hours as needed for nausea or vomiting. 20 tablet 0  . pantoprazole (PROTONIX) 40 MG tablet Take 1 tablet (40 mg total) by mouth 2 (two) times daily. 60 tablet 3  . rivaroxaban (XARELTO) 20 MG TABS tablet Take 1 tablet (20 mg total) by mouth daily. 30 tablet 0  . sertraline (ZOLOFT) 25 MG tablet Take 0.5 tablets (12.5 mg total) by mouth daily. 15 tablet 2  . spironolactone (ALDACTONE) 25 MG tablet Take 0.5 tablets (12.5 mg total) by mouth 2 (two) times daily. 30 tablet 3  . thiamine 100 MG tablet Take 1 tablet (100 mg total) by mouth daily. 30 tablet 0   No  current facility-administered medications for this encounter.   Wt Readings from Last 3 Encounters:  11/25/19 84.5 kg (186 lb 3.2 oz)  11/21/19 83.9 kg (185 lb)  11/14/19 84.8 kg (187 lb)    Vitals:   11/25/19 1102  BP: (!) 140/94  Pulse: 96  SpO2: 97%  Weight: 84.5 kg (186 lb 3.2 oz)  General:  Well appearing. No resp difficulty HEENT: normal Neck: supple. no JVD. Carotids 2+ bilat; no bruits. No lymphadenopathy or thryomegaly appreciated. Cor: PMI nondisplaced. Regular rate & rhythm. No rubs, gallops  or murmurs. Lungs: clear Abdomen: soft, nontender, nondistended. No hepatosplenomegaly. No bruits or masses. Good bowel sounds. Extremities: no cyanosis, clubbing, rash, edema Neuro: alert & orientedx3, cranial nerves grossly intact. moves all 4 extremities w/o difficulty. Affect pleasant     ASSESSMENT & PLAN: 1.Chronic Systolic Heart Failure, NICM  - ECHO 5/21 w/ severely reduced EF 15% RV moderately reduced. Suspect ETOH and cocaine playing a role.  - LHC with no coronary disease. 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 Will refer for genetic testing - NYHA II. Volume status stable.  Cut back lasix to 40 mg daily.  - Continue spiro to 12.5 mg bid  - Continue digoxin 0.125 mg daily  - Stop losartan and tomorrow he will start entresto 24-26 mg twice a day.   - Continue Corlanor 5 mg bid.  - Hold off on SGLT2i at next visit - no  blocker yet.  - not a candidate for advanced therapies w/ polysubstance abuse  - discussed importance of abstinence from ETOH, cocaine and tobacco  - encouraged low sodium diet and daily wts (has scale at home) - Plan to repeat ECHO in 2 months after med optimization  2.Pulmonary Emboli - CTA w/ small LLL PE 5/21 - LE Venous dopplers negative for DVT  - Continue xarelto.    3. ETOH Abuse - Heavy drinker for many years since the age of 66. Has had inpatient treatment  in the past but started drinking again.  - He has started drinking alcohol again.  - I am concerned that he needs intensive treatment for substance abuse/addition. Referred to HFSW  4. H/O Asthma  - stable   5. H/O Cocaine Abuse - No use since discharge.  - most recent UDS 10/31/19 negative   6. Pericardial Effusion - moderate circumferential effusion on cMRI. No definite signs of tamponade on study.  - Repeat ECHO in the 8 weeks.    7. Tobacco Abuse - Discussed smoking cessation   8. Gastritis  - Recent gastritis as outlined above, related to ETOH + Xarelto.  - Hgb stable.  - advised to quit drinking  - continue bid Protonix for GI protection   9. Social - out of work due to medical. Needs short term disability  - . Has financial barriers that may affect compliance - HFSW consulted and will assist w/ needs. Appreciative for their assistance  - Referred to HF Paramedicine.  - HFSW saw him today and provided him with information about [facility]. He plans to go today for and evaluation.   Discussed medication changes.    Follow up in 2 weeks. Greater than 50% of the (total minutes 40) visit spent in counseling/coordination of care regarding the above.      Charls Custer NP-C  11:08 AM

## 2019-11-25 NOTE — Progress Notes (Signed)
Paramedicine Encounter   Patient ID: Robert [name] , male,   DOB: 1981/04/16,39 y.o.,  MRN: [id number]  Robert [name] was seen in the clinic today for our first meeting. He is a new referral who needs assistance with substance abuse. He stated he would be going to the new [facility] facility for help getting signed up rehab. I gave him my card and asked that he call when he finds out his plan. He was understanding and agreeable.   [name], EMT 11/25/2019   ACTION: Home visit completed Next visit planned for ASAP

## 2019-11-25 NOTE — Telephone Encounter (Signed)
Patient is being started on Entresto. Started an [employer] for [employer] assistance. Called [employer] and they still had no update on the application. I requested for it to be expedited.  Will follow up.

## 2019-11-25 NOTE — Progress Notes (Addendum)
CSW met with pt in clinic to discuss rehab for his current alcohol abuse concerns.  Pt admits to current and frequent drinking.  States he often drinks to help him sleep.  When CSW inquired about his willingness to go to rehab he states that at this point he has to go.  CSW asked him to elaborate and he was able to verbalize that he understands this is negatively affecting his health and could contribute to him dying if he continues.  CSW provided pt with information regarding [facility] who accepts community walk ins and can assist with inpatient rehab placement.  Per representative pt can walk in and meet with a counselor to discuss his current concerns then they can help him get into a rehab center either with [facility] or with community rehab options.  Pt is agreeable to going to [facility] following this appt.  CSW encouraged pt to call me if they were unable to help so we could move forward making appts for him to get into rehab.  Pt also met with Community paramedic during visit and is agreeable to him coming out to assist at home.  CSW will continue to follow and assist as needed  Jorge Ny, [facility] Desk#: [phone] Cell#: [phone]

## 2019-11-25 NOTE — Patient Instructions (Signed)
INCREASE Zoloft to 25 mg,one tab daily DECREASE Lasix to 40 mg, one tab daily START Entresto 24/26 mg, one tab twice daily STOP Losartan   Please call [facility] ([place]) P [phone], they will mail medications directly to you.  Your physician recommends that you schedule a follow-up appointment in: 2 weeks with  in the Advanced Practitioners (PA/NP) [facility], you and your health needs are our priority. As part of our continuing mission to provide you with exceptional heart care, we have created designated Provider Care Teams. These Care Teams include your primary Cardiologist (physician) and Advanced Practice Providers (APPs- Physician Assistants and Nurse Practitioners) who all work together to provide you with the care you need, when you need it.   You may see any of the following providers on your designated Care Team at your next follow up: [name] Dr Glori Bickers . Dr Loralie Champagne . Darrick Grinder, NP . Lyda Jester, PA . Audry Riles, PharmD   Please be sure to bring in all your medications bottles to every appointment.

## 2019-11-26 ENCOUNTER — Telehealth (HOSPITAL_COMMUNITY): Payer: Self-pay | Admitting: Licensed Clinical Social Worker

## 2019-11-26 NOTE — Telephone Encounter (Signed)
CSW called pt to check how things went yesterday at [facility].  Pt reports he ended up not going yesterday but is planning on going this afternoon to be assessed for rehab- reports he does not have any needs from [place] at this time- CSW encouraged pt to reach out he needed assistance.  Will continue to follow and assist as needed  Jorge Ny, [facility] Desk#: [phone] Cell#: [phone]

## 2019-11-29 ENCOUNTER — Telehealth (HOSPITAL_COMMUNITY): Payer: Self-pay | Admitting: *Deleted

## 2019-11-29 ENCOUNTER — Encounter (HOSPITAL_COMMUNITY): Payer: Self-pay | Admitting: Pharmacy Technician

## 2019-11-29 ENCOUNTER — Emergency Department (HOSPITAL_COMMUNITY)
Admission: EM | Admit: 2019-11-29 | Discharge: 2019-11-29 | Disposition: A | Discharge status: Home / Self Care | Payer: BC Managed Care – PPO | Attending: Emergency Medicine | Admitting: Emergency Medicine

## 2019-11-29 ENCOUNTER — Emergency Department (HOSPITAL_COMMUNITY): Payer: BC Managed Care – PPO

## 2019-11-29 ENCOUNTER — Other Ambulatory Visit: Payer: Self-pay

## 2019-11-29 DIAGNOSIS — I5022 Chronic systolic (congestive) heart failure: Secondary | ICD-10-CM | POA: Insufficient documentation

## 2019-11-29 DIAGNOSIS — Z7901 Long term (current) use of anticoagulants: Secondary | ICD-10-CM | POA: Diagnosis not present

## 2019-11-29 DIAGNOSIS — R079 Chest pain, unspecified: Secondary | ICD-10-CM | POA: Insufficient documentation

## 2019-11-29 DIAGNOSIS — Z86711 Personal history of pulmonary embolism: Secondary | ICD-10-CM | POA: Insufficient documentation

## 2019-11-29 DIAGNOSIS — Z79899 Other long term (current) drug therapy: Secondary | ICD-10-CM | POA: Diagnosis not present

## 2019-11-29 DIAGNOSIS — J45909 Unspecified asthma, uncomplicated: Secondary | ICD-10-CM | POA: Insufficient documentation

## 2019-11-29 DIAGNOSIS — F17210 Nicotine dependence, cigarettes, uncomplicated: Secondary | ICD-10-CM | POA: Diagnosis not present

## 2019-11-29 LAB — CBC WITH DIFFERENTIAL/PLATELET
Abs Immature Granulocytes: 0.01 10*3/uL (ref 0.00–0.07)
Basophils Absolute: 0 10*3/uL (ref 0.0–0.1)
Basophils Relative: 1 %
Eosinophils Absolute: 0.1 10*3/uL (ref 0.0–0.5)
Eosinophils Relative: 2 %
HCT: 33.8 % — ABNORMAL LOW (ref 39.0–52.0)
Hemoglobin: 11.2 g/dL — ABNORMAL LOW (ref 13.0–17.0)
Immature Granulocytes: 0 %
Lymphocytes Relative: 38 %
Lymphs Abs: 1.4 10*3/uL (ref 0.7–4.0)
MCH: 28.6 pg (ref 26.0–34.0)
MCHC: 33.1 g/dL (ref 30.0–36.0)
MCV: 86.2 fL (ref 80.0–100.0)
Monocytes Absolute: 0.5 10*3/uL (ref 0.1–1.0)
Monocytes Relative: 13 %
Neutro Abs: 1.7 10*3/uL (ref 1.7–7.7)
Neutrophils Relative %: 46 %
Platelets: 194 10*3/uL (ref 150–400)
RBC: 3.92 MIL/uL — ABNORMAL LOW (ref 4.22–5.81)
RDW: 13.8 % (ref 11.5–15.5)
WBC: 3.7 10*3/uL — ABNORMAL LOW (ref 4.0–10.5)
nRBC: 0 % (ref 0.0–0.2)

## 2019-11-29 LAB — COMPREHENSIVE METABOLIC PANEL
ALT: 32 U/L (ref 0–44)
AST: 36 U/L (ref 15–41)
Albumin: 4.2 g/dL (ref 3.5–5.0)
Alkaline Phosphatase: 45 U/L (ref 38–126)
Anion gap: 15 (ref 5–15)
BUN: 31 mg/dL — ABNORMAL HIGH (ref 6–20)
CO2: 22 mmol/L (ref 22–32)
Calcium: 8.8 mg/dL — ABNORMAL LOW (ref 8.9–10.3)
Chloride: 99 mmol/L (ref 98–111)
Creatinine, Ser: 1.29 mg/dL — ABNORMAL HIGH (ref 0.61–1.24)
GFR calc Af Amer: >60 mL/min (ref >60)
GFR calc non Af Amer: >60 mL/min (ref >60)
Glucose, Bld: 116 mg/dL — ABNORMAL HIGH (ref 70–99)
Potassium: 4.3 mmol/L (ref 3.5–5.1)
Sodium: 136 mmol/L (ref 135–145)
Total Bilirubin: 0.9 mg/dL (ref 0.3–1.2)
Total Protein: 7.3 g/dL (ref 6.5–8.1)

## 2019-11-29 LAB — TROPONIN I (HIGH SENSITIVITY)
Troponin I (High Sensitivity): 15 ng/L (ref <18)
Troponin I (High Sensitivity): 17 ng/L (ref <18)

## 2019-11-29 MED ORDER — LORAZEPAM 2 MG/ML IJ SOLN
2.0000 mg | Freq: Once | INTRAMUSCULAR | Status: AC
  Administered 2019-11-29: 2 mg via INTRAVENOUS
  Filled 2019-11-29: qty 1

## 2019-11-29 MED ORDER — CHLORDIAZEPOXIDE HCL 25 MG PO CAPS
ORAL_CAPSULE | ORAL | 0 refills | Status: DC
Start: 2019-11-29 — End: 2019-12-09

## 2019-11-29 NOTE — ED Provider Notes (Signed)
[name] EMERGENCY DEPARTMENT Provider Note   CSN: [id number] Arrival date & time: 11/29/19  4332     History Chief Complaint  Patient presents with  . Chest Pain    Robert [name] is a 39 y.o. male possible history of CHF (EF of 20%), asthma, alcohol abuse, PE (currently on Xarelto) who presents for evaluation of midsternal chest pain.  He reports that this is been an ongoing issue since yesterday.  He states that chest pain is intermittent but there is not any specific action that brings on the chest pain.  He reports that he has had several month long history of intermittent chest pain and this feels similar.  He states that currently the chest pain is midsternal and describes it as a pressure.  He states that sometimes when he has the chest pain, he feels like it radiates to the left and right side of his chest.  He states it is not worse with exertion or deep inspiration.  He does not feel like he is having any shortness of breath.  He does not feel fluid overloaded.  He states that last night, he could not sleep because of the pain, prompting EMS call.  He was given 324 mg aspirin and 2 times sublingual nitrogen with no improvement.  He states that he drank alcohol last night to try and go to sleep.  His last drink was sometime last night where he had 2 shots of Shearon Stalls.  He reports that normally he drinks 4-5 bottles of airplane liquor a day.  He also endorses smoking several cigarettes a day. He does report he has had a cough that is sometimes productive of phlegm. No gross hemoptysis. He states he has not used cocaine for several weeks.  He denies any preceding trauma, injury, fall.  He reports he has been taking his medications and has not missed any doses of Xarelto.  He denies any fever, difficulty breathing, abdominal pain, nausea/vomiting.   The history is provided by the patient.    HPI: A 39 year old patient presents for evaluation of chest pain.  Initial onset of pain was more than 6 hours ago. The patient's chest pain is described as heaviness/pressure/tightness and is not worse with exertion. The patient's chest pain is middle- or left-sided, is not well-localized, is not sharp and does not radiate to the arms/jaw/neck. The patient does not complain of nausea and denies diaphoresis. The patient has smoked in the past 90 days. The patient has no history of stroke, has no history of peripheral artery disease, denies any history of treated diabetes, has no relevant family history of coronary artery disease (first degree relative at less than age 67), is not hypertensive, has no history of hypercholesterolemia and does not have an elevated BMI (>=30).   Past Medical History:  Diagnosis Date  . Alcohol use   . Asthma   . CHF (congestive heart failure) [facility])     Patient Active Problem List   Diagnosis Date Noted  . AKI (acute kidney injury) ([place]) 10/30/2019  . Gastritis 10/30/2019  . Elevated INR 10/30/2019  . 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[place])   . Acute heart failure ([place]) 10/05/2019  . Pulmonary embolism ([place]) 10/04/2019  . Acute pulmonary embolism ([place]) 10/04/2019  . Chronic systolic CHF (congestive heart failure) ([place])   . Inappropriate sinus tachycardia   . Chest pain 11/03/2013  . Tobacco abuse 11/03/2013    Past Surgical History:  Procedure  Laterality Date  . ESOPHAGOGASTRODUODENOSCOPY (EGD) WITH PROPOFOL N/A 11/01/2019   Procedure: ESOPHAGOGASTRODUODENOSCOPY (EGD) WITH PROPOFOL;  Surgeon: [name], MD;  Location: [facility] ENDOSCOPY;  Service: Endoscopy;  Laterality: N/A;  . RIGHT/LEFT HEART CATH AND CORONARY ANGIOGRAPHY N/A 10/06/2019   Procedure: RIGHT/LEFT HEART CATH AND CORONARY ANGIOGRAPHY;  Surgeon: [name], MD;  Location: [facility] LAB;  Service: Cardiovascular;  Laterality: N/A;  . WRIST SURGERY         Family History  Problem Relation Age of Onset  . Heart failure Father    . Diabetes Father   . Kidney disease Father     Social History   Tobacco Use  . Smoking status: Current Every Day Smoker    Packs/day: 0.50    Types: Cigarettes  . Smokeless tobacco: Never Used  Vaping Use  . Vaping Use: Former  Substance Use Topics  . Alcohol use: Yes  . Drug use: Yes    Types: Marijuana    Home Medications Prior to Admission medications   Medication Sig Start Date End Date Taking? Authorizing Provider  digoxin (LANOXIN) 0.125 MG tablet Take 1 tablet (0.125 mg total) by mouth daily. 10/14/19  Yes Lyda Jester M, PA-C  folic acid (FOLVITE) 1 MG tablet Take 1 tablet (1 mg total) by mouth daily. 11/08/19  Yes Clegg, Amy D, NP  furosemide (LASIX) 40 MG tablet Take 1 tablet (40 mg total) by mouth daily. 11/25/19  Yes Clegg, Amy D, NP  ivabradine (CORLANOR) 5 MG TABS tablet Take 1 tablet (5 mg total) by mouth 2 (two) times daily with a meal. 10/13/19  Yes Lyda Jester M, PA-C  Magnesium Oxide 200 MG TABS Take 1 tablet (200 mg total) by mouth 2 (two) times daily. 10/28/19  Yes Clegg, Amy D, NP  ondansetron (ZOFRAN ODT) 4 MG disintegrating tablet Take 1 tablet (4 mg total) by mouth every 8 (eight) hours as needed for nausea or vomiting. 11/21/19  Yes Joy, Shawn C, PA-C  pantoprazole (PROTONIX) 40 MG tablet Take 1 tablet (40 mg total) by mouth 2 (two) times daily. 11/08/19  Yes Clegg, Amy D, NP  rivaroxaban (XARELTO) 20 MG TABS tablet Take 1 tablet (20 mg total) by mouth daily. 11/03/19  Yes Thurnell Lose, MD  sacubitril-valsartan (ENTRESTO) 24-26 MG Take 1 tablet by mouth 2 (two) times daily. 11/25/19  Yes Clegg, Amy D, NP  sertraline (ZOLOFT) 25 MG tablet Take 1 tablet (25 mg total) by mouth daily. 11/25/19 11/24/20 Yes Clegg, Amy D, NP  spironolactone (ALDACTONE) 25 MG tablet Take 0.5 tablets (12.5 mg total) by mouth 2 (two) times daily. 11/03/19  Yes Lyda Jester M, PA-C  thiamine 100 MG tablet Take 1 tablet (100 mg total) by mouth daily. 11/03/19  Yes Thurnell Lose, MD  chlordiazePOXIDE (LIBRIUM) 25 MG capsule 50mg  PO TID x 1D, then 25-50mg  PO BID X 1D, then 25-50mg  PO QD X 1D 11/29/19   Volanda Napoleon, PA-C    Allergies    Bactrim [sulfamethoxazole-trimethoprim]  Review of Systems   Review of Systems  Constitutional: Negative for fever.  Respiratory: Negative for cough and shortness of breath.   Cardiovascular: Positive for chest pain. Negative for leg swelling.  Gastrointestinal: Negative for abdominal pain, nausea and vomiting.  Genitourinary: Negative for dysuria and hematuria.  Neurological: Negative for headaches.  All other systems reviewed and are negative.   Physical Exam Updated Vital Signs BP 136/81   Pulse 63   Temp 99.1 F (  37.3 C) (Oral)   Resp 17   Ht 5\' 10"  (1.778 m)   Wt 83.5 kg   SpO2 99%   BMI 26.40 kg/m   Physical Exam Vitals and nursing note reviewed.  Constitutional:      Appearance: Normal appearance. He is well-developed.     Comments: Slightly tremulous   HENT:     Head: Normocephalic and atraumatic.  Eyes:     General: Lids are normal.     Conjunctiva/sclera: Conjunctivae normal.     Pupils: Pupils are equal, round, and reactive to light.  Cardiovascular:     Rate and Rhythm: Normal rate and regular rhythm.     Pulses: Normal pulses.          Radial pulses are 2+ on the right side and 2+ on the left side.       Dorsalis pedis pulses are 2+ on the right side and 2+ on the left side.     Heart sounds: Normal heart sounds. No murmur heard.  No friction rub. No gallop.   Pulmonary:     Effort: Pulmonary effort is normal.     Breath sounds: Normal breath sounds.     Comments: Lungs clear to auscultation bilaterally.  Symmetric chest rise.  No wheezing, rales, rhonchi. Abdominal:     Palpations: Abdomen is soft. Abdomen is not rigid.     Tenderness: There is no abdominal tenderness. There is no guarding.     Comments: Abdomen is soft, non-distended, non-tender. No rigidity, No guarding. No  peritoneal signs.  Musculoskeletal:        General: Normal range of motion.     Cervical back: Full passive range of motion without pain.     Comments: BLE are symmetric in appearance without any overlying warmth, erythema, edema.   Skin:    General: Skin is warm and dry.     Capillary Refill: Capillary refill takes less than 2 seconds.  Neurological:     Mental Status: He is alert and oriented to person, place, and time.  Psychiatric:        Speech: Speech normal.     ED Results / Procedures / Treatments   Labs (all labs ordered are listed, but only abnormal results are displayed) Labs Reviewed  COMPREHENSIVE METABOLIC PANEL - Abnormal; Notable for the following components:      Result Value   Glucose, Bld 116 (*)    BUN 31 (*)    Creatinine, Ser 1.29 (*)    Calcium 8.8 (*)    All other components within normal limits  CBC WITH DIFFERENTIAL/PLATELET - Abnormal; Notable for the following components:   WBC 3.7 (*)    RBC 3.92 (*)    Hemoglobin 11.2 (*)    HCT 33.8 (*)    All other components within normal limits  TROPONIN I (HIGH SENSITIVITY)  TROPONIN I (HIGH SENSITIVITY)    EKG EKG Interpretation  Date/Time:  Monday November 29 2019 07:31:59 EDT Ventricular Rate:  80 PR Interval:    QRS Duration: 93 QT Interval:  404 QTC Calculation: 466 R Axis:   87 Text Interpretation: Sinus rhythm Probable LVH with secondary repol abnrm No significant change since last tracing Confirmed by Theotis Burrow [id number]) on 11/29/2019 7:34:56 AM   Radiology DG Chest 2 View  Result Date: 11/29/2019 CLINICAL DATA:  chest pain onset yesterday.recent chf diagnosis. asthma EXAM: CHEST - 2 VIEW COMPARISON:  Chest radiograph 11/21/2019 FINDINGS: Stable cardiomediastinal contours with mild cardiomegaly. The lungs are  clear. No pneumothorax or pleural effusion. No acute finding in the visualized skeleton. IMPRESSION: No acute cardiopulmonary finding. Electronically Signed   By: Audie Pinto M.D.    On: 11/29/2019 08:25    Procedures Procedures (including critical care time)  Medications Ordered in ED Medications  LORazepam (ATIVAN) injection 2 mg (2 mg Intravenous Given 11/29/19 6073)    ED Course  I have reviewed the triage vital signs and the nursing notes.  Pertinent labs & imaging results that were available during my care of the patient were reviewed by me and considered in my medical decision making (see chart for details).    MDM Rules/Calculators/A&P HEAR Score: 67                        39 year old male who presents for evaluation of chest pain that began yesterday.  Reports he has had intermittent chest pain since onset of symptoms yesterday.  His chest pain is not associated with any exertional activity or difficulty breathing.  No recent sicknesses.  Initially arrival, he is afebrile, nontoxic-appearing.  Vital signs are stable.  He is slightly tremulous on exam but is not tachycardic.  Does not appear to be in acute withdrawals.  He does report a history of alcohol abuse and states that his last drink was yesterday.  He has never gone into DTs or seizures from alcohol withdrawal.  Concern for ACS etiology given patient's history and risk factors but sounds atypical in nature.  History/physical exam not concerning for acute heart failure exacerbation.  He does not appear fluid overloaded and is not having any difficulty breathing.  His vitals are stable.  Additionally, doubt PE.  He is on Xarelto and states that he has been taking his doses.  He is not tachycardic or hypoxic here in the ED.  Question if this is anxiety versus musculoskeletal pain, Plan to check labs.   Troponin is 15.  CMP shows BUN of 31, creatinine 1.29.  CBC shows leukopenia of 3.7, hemoglobin of 11.2.  Review of records show that he had a right heart cath done in May 2021 which showed normal coronary arteries. No intervention at that time.   Given patient's history/patient with his, he has a heart  score of 4.  Delta trop is 17.   At this time, patient reports improvement in pain after Ativan.  He is hemodynamically stable.  He has 2 - troponins here in the ED and his symptoms do not sound consistent with ACS etiology.  I discussed with both him and his mom.  Patient is interested in quitting drinking.  At this time, he does not exhibit any signs of acute withdrawal that would require admission.  I discussed with both mom and patient regarding Librium taper and outpatient resources.  Patient is agreeable.  I discussed with patient that he will need to follow-up with his cardiologist as directed. At this time, patient exhibits no emergent life-threatening condition that require further evaluation in ED. Discussed patient with Dr. Rex Kras who is agreeable to plan. At this time, patient exhibits no emergent life-threatening condition that require further evaluation in ED. Patient had ample opportunity for questions and discussion. All patient's questions were answered with full understanding.  Portions of this note were generated with [employer]. Dictation errors may occur despite best attempts at proofreading.   Final Clinical Impression(s) / ED Diagnoses Final diagnoses:  Nonspecific chest pain    Rx / DC Orders ED Discharge  Orders         Ordered    chlordiazePOXIDE (LIBRIUM) 25 MG capsule     Discontinue  Reprint     11/29/19 1124           Desma Mcgregor 11/29/19 1320    Little, Wenda Overland, MD 11/29/19 1326

## 2019-11-29 NOTE — Telephone Encounter (Signed)
Received pt's disability forms from [place]. Forms completed and left for Dr Haroldine Laws to review and sign

## 2019-11-29 NOTE — ED Notes (Signed)
Patient Alert and oriented to baseline. Stable and ambulatory to baseline. Patient verbalized understanding of the discharge instructions.  Patient belongings were taken by the patient.   

## 2019-11-29 NOTE — ED Notes (Signed)
Patient transported to X-ray 

## 2019-11-29 NOTE — Discharge Instructions (Signed)
As we discussed your workup today was reassuring.  Please follow-up with your cardiologist.  Return to the Emergency Department immediately if you experiencing worsening chest pain, difficulty breathing, nausea/vomiting, get very sweaty, headache or any other worsening or concerning symptoms.   Additionally, I provided you some resources regarding alcohol abuse.  I have also prescribed you Librium taper which you can take to help with symptoms.

## 2019-11-29 NOTE — ED Triage Notes (Signed)
Pt bib ems from home with reports of chest pain onset yesterday. Pt with recent chf diagnosis. Given 324mg  asa and 2 sublingual NTG without change. Recently decreased lasix from 80mg  to 40mg  and started on entresto.  142/70 HR 84 100% RA RR 20 12 lead unremarkable

## 2019-12-02 ENCOUNTER — Other Ambulatory Visit (HOSPITAL_COMMUNITY): Payer: Self-pay

## 2019-12-02 NOTE — Progress Notes (Signed)
Paramedicine Encounter    Patient ID: Robert [name], male    DOB: [birth year], 39 y.o.   MRN: [id number]   Patient Care Team: Patient, No Pcp Per as PCP - General ([place]) Robert Latch, MD as PCP - Cardiology (Cardiology)  Patient Active Problem List   Diagnosis Date Noted  . AKI (acute kidney injury) ([place]) 10/30/2019  . Gastritis 10/30/2019  . Elevated INR 10/30/2019  . 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[place])   . Acute heart failure ([place]) 10/05/2019  . Pulmonary embolism ([place]) 10/04/2019  . Acute pulmonary embolism ([place]) 10/04/2019  . Chronic systolic CHF (congestive heart failure) ([place])   . Inappropriate sinus tachycardia   . Chest pain 11/03/2013  . Tobacco abuse 11/03/2013    Current Outpatient Medications:  .  chlordiazePOXIDE (LIBRIUM) 25 MG capsule, 50mg  PO TID x 1D, then 25-50mg  PO BID X 1D, then 25-50mg  PO QD X 1D, Disp: 10 capsule, Rfl: 0 .  digoxin (LANOXIN) 0.125 MG tablet, Take 1 tablet (0.125 mg total) by mouth daily., Disp: 30 tablet, Rfl: 5 .  folic acid (FOLVITE) 1 MG tablet, Take 1 tablet (1 mg total) by mouth daily., Disp: 30 tablet, Rfl: 3 .  furosemide (LASIX) 40 MG tablet, Take 1 tablet (40 mg total) by mouth daily., Disp: 30 tablet, Rfl: 3 .  ivabradine (CORLANOR) 5 MG TABS tablet, Take 1 tablet (5 mg total) by mouth 2 (two) times daily with a meal., Disp: 60 tablet, Rfl: 5 .  pantoprazole (PROTONIX) 40 MG tablet, Take 1 tablet (40 mg total) by mouth 2 (two) times daily., Disp: 60 tablet, Rfl: 3 .  rivaroxaban (XARELTO) 20 MG TABS tablet, Take 1 tablet (20 mg total) by mouth daily., Disp: 30 tablet, Rfl: 0 .  sacubitril-valsartan (ENTRESTO) 24-26 MG, Take 1 tablet by mouth 2 (two) times daily., Disp: 60 tablet, Rfl: 11 .  sertraline (ZOLOFT) 25 MG tablet, Take 1 tablet (25 mg total) by mouth daily., Disp: 30 tablet, Rfl: 2 .  spironolactone (ALDACTONE) 25 MG tablet, Take 0.5 tablets (12.5 mg total) by mouth 2 (two)  times daily., Disp: 30 tablet, Rfl: 3 .  thiamine 100 MG tablet, Take 1 tablet (100 mg total) by mouth daily., Disp: 30 tablet, Rfl: 0 .  Magnesium Oxide 200 MG TABS, Take 1 tablet (200 mg total) by mouth 2 (two) times daily. (Patient not taking: Reported on 12/02/2019), Disp: 60 tablet, Rfl: 11 .  ondansetron (ZOFRAN ODT) 4 MG disintegrating tablet, Take 1 tablet (4 mg total) by mouth every 8 (eight) hours as needed for nausea or vomiting. (Patient not taking: Reported on 12/02/2019), Disp: 20 tablet, Rfl: 0 Allergies  Allergen Reactions  . Bactrim [Sulfamethoxazole-Trimethoprim] Rash      Social History   Socioeconomic History  . Marital status: Single    Spouse name: Not on file  . Number of children: Not on file  . Years of education: Not on file  . Highest education level: Not on file  Occupational History  . Not on file  Tobacco Use  . Smoking status: Current Every Day Smoker    Packs/day: 0.50    Types: Cigarettes  . Smokeless tobacco: Never Used  Vaping Use  . Vaping Use: Former  Substance and Sexual Activity  . Alcohol use: Yes  . Drug use: Yes    Types: Marijuana  . Sexual activity: Not on file  Other Topics Concern  . Not on file  Social History Narrative  .  Not on file   Social Determinants of Health   Financial Resource Strain: High Risk  . Difficulty of Paying Living Expenses: Very hard  Food Insecurity: Food Insecurity Present  . Worried About Charity fundraiser in the Last Year: Sometimes true  . Ran Out of Food in the Last Year: Sometimes true  Transportation Needs:   . Lack of Transportation (Medical):   [name] Lack of Transportation (Non-Medical):   Physical Activity:   . Days of Exercise per Week:   . Minutes of Exercise per Session:   Stress:   . Feeling of Stress :   Social Connections:   . Frequency of Communication with Friends and Family:   . Frequency of Social Gatherings with Friends and Family:   . Attends Religious Services:   . Active  Member of Clubs or Organizations:   . Attends Archivist Meetings:   [name] Marital Status:   Intimate Partner Violence:   . Fear of Current or Ex-Partner:   . Emotionally Abused:   [name] Physically Abused:   . Sexually Abused:     Physical Exam Cardiovascular:     Rate and Rhythm: Normal rate and regular rhythm.     Pulses: Normal pulses.  Pulmonary:     Effort: Pulmonary effort is normal.     Breath sounds: Normal breath sounds.  Musculoskeletal:        General: Normal range of motion.     Right lower leg: No edema.     Left lower leg: No edema.  Skin:    General: Skin is warm and dry.     Capillary Refill: Capillary refill takes less than 2 seconds.  Neurological:     Mental Status: He is alert and oriented to person, place, and time.  Psychiatric:        Mood and Affect: Mood normal.         Future Appointments  Date Time Provider [place]  12/09/2019 10:30 AM [facility] PA/NP [facility] None    BP (!) 129/79 (BP Location: Left Arm, Patient Position: Sitting, Cuff Size: Normal)   Pulse 80   Resp 16   Wt 187 lb (84.8 kg)   SpO2 99%   BMI 26.83 kg/m   Weight yesterday- did not weigh  Last visit weight- 184 lb  Robert [name] was seen at home today for our initial home visit. He was seen in the clinic last week and told staff he was going to seek substance abuse and depression treatment immediately following that appointment however he did not. He advised that he has continued to drink at least 2-3 airplane bottles of liquor per day and is only marginally compliant with his medications. Upon verifying his medications I noted that he has most of his medications but just was not taking them on a regular basis. We spent most of our time talking about the importance of getting into treatment for substance abuse and depression and he seemed willing to go to the new behavioral health facility for assistance advising that he would go today. We also discussed the importance  of taking his medications as prescribed. He had a pillbox however it was a bit confusing so I advised I would bring him a new one from the clinic. He was agreeable to this. We spent some time going over his medications however I do not think he is in a proper mental state to grasp the information I am giving. His pillbox was refilled and digoxin, folic  acid, spironolactone and xarelto were ordered from the pharmacy. Corlanor was also ordered through [place] and will be delivered next week via [employer]. I will follow up later tomorrow via phone to see if he was able to get the aforementioned assistance.   [name], EMT 12/02/19  ACTION: Home visit completed Next visit planned for tomorrow

## 2019-12-02 NOTE — Telephone Encounter (Signed)
Advanced Heart Failure Patient Advocate Encounter   Patient was approved to receive Xarelto assistance from [employer]   Effective dates: 11/29/19 through 11/28/20  Pharmacy Billing Information  [place]: [id number]  Group: [id number]  ID: [id number]  The patient can go and pick up his Xarelto using this card information from his pharmacy. Called and spoke with patient and pharmacy.  Entresto application has been sent to [employer]. Will follow up.

## 2019-12-06 ENCOUNTER — Other Ambulatory Visit: Payer: Self-pay

## 2019-12-06 ENCOUNTER — Ambulatory Visit (HOSPITAL_COMMUNITY)
Admission: EM | Admit: 2019-12-06 | Discharge: 2019-12-06 | Disposition: A | Discharge status: Home / Self Care | Payer: BC Managed Care – PPO

## 2019-12-06 ENCOUNTER — Encounter (HOSPITAL_COMMUNITY): Payer: Self-pay | Admitting: General Practice

## 2019-12-06 ENCOUNTER — Telehealth (HOSPITAL_COMMUNITY): Payer: Self-pay | Admitting: Licensed Clinical Social Worker

## 2019-12-06 NOTE — BH Assessment (Signed)
Patient requests outpatient substance abuse services resources.  Patient denied SI/HI/Psychosis.  Patient declined MSE Exam.

## 2019-12-06 NOTE — Telephone Encounter (Signed)
CSW called pt to remind of [facility] Support group tomorrow afternoon.  Pt is planning on attending and reports no anticipated barriers.  Will continue to follow through [employer] and assist as needed  Jorge Ny, [facility] Desk#: [phone] Cell#: [phone]

## 2019-12-08 ENCOUNTER — Telehealth (HOSPITAL_COMMUNITY): Payer: Self-pay

## 2019-12-08 NOTE — Telephone Encounter (Signed)
Called Mr Joynt to see when he would be available for an appointment. He stated he would be in the clinic tomorrow morning so I asked that he bring his medications with him so we could fill his pillbox there. He was understanding and agreeable.   [name], EMT 12/08/19

## 2019-12-09 ENCOUNTER — Encounter (HOSPITAL_COMMUNITY): Payer: BC Managed Care – PPO

## 2019-12-09 ENCOUNTER — Ambulatory Visit (HOSPITAL_COMMUNITY)
Admission: RE | Admit: 2019-12-09 | Discharge: 2019-12-09 | Disposition: A | Discharge status: Home / Self Care | Payer: BC Managed Care – PPO | Source: Ambulatory Visit | Attending: Internal Medicine | Admitting: Internal Medicine

## 2019-12-09 ENCOUNTER — Other Ambulatory Visit: Payer: Self-pay

## 2019-12-09 VITALS — BP 118/85 | HR 86 | Wt 194.0 lb

## 2019-12-09 DIAGNOSIS — I2699 Other pulmonary embolism without acute cor pulmonale: Secondary | ICD-10-CM | POA: Insufficient documentation

## 2019-12-09 DIAGNOSIS — F17210 Nicotine dependence, cigarettes, uncomplicated: Secondary | ICD-10-CM | POA: Insufficient documentation

## 2019-12-09 DIAGNOSIS — Z8249 Family history of ischemic heart disease and other diseases of the circulatory system: Secondary | ICD-10-CM | POA: Insufficient documentation

## 2019-12-09 DIAGNOSIS — K2970 Gastritis, unspecified, without bleeding: Secondary | ICD-10-CM | POA: Diagnosis not present

## 2019-12-09 DIAGNOSIS — I313 Pericardial effusion (noninflammatory): Secondary | ICD-10-CM | POA: Diagnosis not present

## 2019-12-09 DIAGNOSIS — Z7901 Long term (current) use of anticoagulants: Secondary | ICD-10-CM | POA: Diagnosis not present

## 2019-12-09 DIAGNOSIS — F1010 Alcohol abuse, uncomplicated: Secondary | ICD-10-CM | POA: Diagnosis not present

## 2019-12-09 DIAGNOSIS — J45909 Unspecified asthma, uncomplicated: Secondary | ICD-10-CM | POA: Diagnosis not present

## 2019-12-09 DIAGNOSIS — I5082 Biventricular heart failure: Secondary | ICD-10-CM | POA: Diagnosis not present

## 2019-12-09 DIAGNOSIS — Z79899 Other long term (current) drug therapy: Secondary | ICD-10-CM | POA: Insufficient documentation

## 2019-12-09 DIAGNOSIS — I5022 Chronic systolic (congestive) heart failure: Secondary | ICD-10-CM

## 2019-12-09 DIAGNOSIS — I428 Other cardiomyopathies: Secondary | ICD-10-CM | POA: Insufficient documentation

## 2019-12-09 DIAGNOSIS — Z720 Tobacco use: Secondary | ICD-10-CM

## 2019-12-09 LAB — BASIC METABOLIC PANEL
Anion gap: 12 (ref 5–15)
BUN: 25 mg/dL — ABNORMAL HIGH (ref 6–20)
CO2: 24 mmol/L (ref 22–32)
Calcium: 9 mg/dL (ref 8.9–10.3)
Chloride: 102 mmol/L (ref 98–111)
Creatinine, Ser: 1.37 mg/dL — ABNORMAL HIGH (ref 0.61–1.24)
GFR calc Af Amer: >60 mL/min (ref >60)
GFR calc non Af Amer: >60 mL/min (ref >60)
Glucose, Bld: 96 mg/dL (ref 70–99)
Potassium: 4.2 mmol/L (ref 3.5–5.1)
Sodium: 138 mmol/L (ref 135–145)

## 2019-12-09 LAB — CBC
HCT: 31.7 % — ABNORMAL LOW (ref 39.0–52.0)
Hemoglobin: 10.2 g/dL — ABNORMAL LOW (ref 13.0–17.0)
MCH: 28.9 pg (ref 26.0–34.0)
MCHC: 32.2 g/dL (ref 30.0–36.0)
MCV: 89.8 fL (ref 80.0–100.0)
Platelets: 245 10*3/uL (ref 150–400)
RBC: 3.53 MIL/uL — ABNORMAL LOW (ref 4.22–5.81)
RDW: 15.9 % — ABNORMAL HIGH (ref 11.5–15.5)
WBC: 3.8 10*3/uL — ABNORMAL LOW (ref 4.0–10.5)
nRBC: 0 % (ref 0.0–0.2)

## 2019-12-09 LAB — BRAIN NATRIURETIC PEPTIDE: B Natriuretic Peptide: 434.6 pg/mL — ABNORMAL HIGH (ref 0.0–100.0)

## 2019-12-09 MED ORDER — DAPAGLIFLOZIN PROPANEDIOL 10 MG PO TABS
10.0000 mg | ORAL_TABLET | Freq: Every day | ORAL | 6 refills | Status: DC
Start: 2019-12-09 — End: 2020-09-07

## 2019-12-09 MED ORDER — ENTRESTO 49-51 MG PO TABS
1.0000 | ORAL_TABLET | Freq: Two times a day (BID) | ORAL | 6 refills | Status: DC
Start: 2019-12-09 — End: 2019-12-30

## 2019-12-09 NOTE — Patient Instructions (Signed)
Increase Entresto 49/51 mg Twice daily   Start Farxiga 10 mg Daily  IF you feel dizzy or lightheaded you can hold your Furosemide  Labs done today, your results will be available in MyChart, we will contact you for abnormal readings.  Please follow up with our heart failure pharmacist in a few weeks  Your physician recommends that you schedule a follow-up appointment in: 2-3 months with an echocardiogram  If you have any questions or concerns before your next appointment please send [place] a message through [place] or call our office at [phone].    TO LEAVE A MESSAGE FOR THE NURSE SELECT OPTION 2, PLEASE LEAVE A MESSAGE INCLUDING: . YOUR NAME . DATE OF BIRTH . CALL BACK NUMBER . REASON FOR CALL**this is important as we prioritize the call backs  [place] AS LONG AS YOU CALL BEFORE 4:00 PM  At the [facility], you and your health needs are our priority. As part of our continuing mission to provide you with exceptional heart care, we have created designated Provider Care Teams. These Care Teams include your primary Cardiologist (physician) and Advanced Practice Providers (APPs- Physician Assistants and Nurse Practitioners) who all work together to provide you with the care you need, when you need it.   You may see any of the following providers on your designated Care Team at your next follow up: [name] Dr Glori Bickers . Dr Loralie Champagne . Darrick Grinder, NP . Lyda Jester, PA . Audry Riles, PharmD   Please be sure to bring in all your medications bottles to every appointment.

## 2019-12-09 NOTE — Progress Notes (Signed)
CSW met with pt to check in regarding substance abuse rehab.  Pt did go to [facility] center yesterday and pick up a list of outpatient resources- no longer appears to be interested in inpatient options.  Pt did not bring list with him but reports he is planning to call and inquire about rehab.  CSW offered to assist him with this but he would like to do it by himself.  Encouraged pt to let us know if he decides he does want support in finding rehab options.    Jorge Ny, LCSW Clinical Social Worker [facility] Desk#: [phone] Cell#: [phone]

## 2019-12-09 NOTE — Progress Notes (Signed)
ADVANCED HF CLINIC NOTE  PCP: Primary Cardiologist: Dr Haroldine Laws    Reason for F/u: F/u for Chronic Systolic Heart Failure   HPI: 39 y/o male with h/o heavy ETOH and recent cocaine use, PE,  diagnosed biventricular heart failure in 5/21  Admitted with acute onset HF with severe biventricular failure in 6/21. EF 15%. RV down. He was drinking at least 2 beers every night and about 12-24 over the weekend. Also drinking 1/5 of liqour every other day. Cath shown normal coronaries. Elevated filling pressures with normal output. Placed on milrinone and diuresed with IV lasix. As he improved milrinone was weaned off . Transitioned oral lasix. 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 CT shows small LLL PE. Placed on xarelto. Started on HF meds.  Discharge weight 178 pounds.   Had initial post hospital f/u 6/14 and was volume overloaded. Given IV Lasix in clinic and home lasix increased to 80 mg daily.   Readmitted by [facility] and discharged 11/02/19. Admitted for mild acute blood loss related anemia due to ongoing most likely upper GI bleed caused from gastritis in the setting of Xarelto use. This likely has been brought on by excessive alcohol intake. He was treated with bowel rest, IV fluids and IV PPI. He was seen by GI and underwent EGD showing no active bleeding but evidence of some gastritis.  He was switched to oral PPI twice daily, H&H remained stable and he did not require any transfusions.  He was instructed to continue Xarelto.   Multiple ER visits for CP on  7/4, 7/10, and 11/21/19. HS Trop was low on all 3 occasions. He has started drinking alcohol again and was thinking he was going to go withdrawl from alcohol.  Today he returns for HF follow up. Continues to have episodes of CP. Still drinking 3-4 little bottles of Vodka daily. Says he stopped cocaine. Compliant with meds. Went to [employer] yesterday and give names for outpatient ETOH programs. Mild DOE. Weight up 7 pounds. No  orthopnea or PND.    ECHO 10/12/2019 Pericardial effusion remains moderate in size. No tamponade physiology. ECHO 10/05/2019 EF <20%  Prominent trabeculation. Grade III DD  [facility] 10/05/2019 showed normal coronaries, elevated filling pressures with normal output.  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  SH:  Social History   Socioeconomic History  . Marital status: Single    Spouse name: Not on file  . Number of children: Not on file  . Years of education: Not on file  . Highest education level: Not on file  Occupational History  . Not on file  Tobacco Use  . Smoking status: Current Every Day Smoker    Packs/day: 0.50    Types: Cigarettes  . Smokeless tobacco: Never Used  Vaping Use  . Vaping Use: Former  Substance and Sexual Activity  . Alcohol use: Yes  . Drug use: Yes    Types: Marijuana  . Sexual activity: Not on file  Other Topics Concern  . Not on file  Social History Narrative  . Not on file   Social Determinants of Health   Financial Resource Strain: High Risk  . Difficulty of Paying Living Expenses: Very hard  Food Insecurity: Food Insecurity Present  . Worried About Charity fundraiser in the Last Year: Sometimes true  . Ran Out of Food in the Last Year:  Sometimes true  Transportation Needs:   . Lack of Transportation (Medical):   [name] Lack of Transportation (Non-Medical):   Physical Activity:   . Days of Exercise per Week:   . Minutes of Exercise per Session:   Stress:   . Feeling of Stress :   Social Connections:   . Frequency of Communication with Friends and Family:   . Frequency of Social Gatherings with Friends and Family:   . Attends Religious Services:   . Active Member of Clubs or Organizations:   . Attends Archivist Meetings:   [name] Marital Status:   Intimate Partner Violence:   . Fear of Current or Ex-Partner:   .  Emotionally Abused:   [name] Physically Abused:   . Sexually Abused:     FH:  Family History  Problem Relation Age of Onset  . Heart failure Father   . Diabetes Father   . Kidney disease Father     Past Medical History:  Diagnosis Date  . Alcohol use   . Asthma   . CHF (congestive heart failure) (HCC)     Current Outpatient Medications  Medication Sig Dispense Refill  . digoxin (LANOXIN) 0.125 MG tablet Take 1 tablet (0.125 mg total) by mouth daily. 30 tablet 5  . folic acid (FOLVITE) 1 MG tablet Take 1 tablet (1 mg total) by mouth daily. 30 tablet 3  . furosemide (LASIX) 40 MG tablet Take 1 tablet (40 mg total) by mouth daily. 30 tablet 3  . ivabradine (CORLANOR) 5 MG TABS tablet Take 1 tablet (5 mg total) by mouth 2 (two) times daily with a meal. 60 tablet 5  . Magnesium Oxide (MAG-200) 200 MG TABS Take 200 mg by mouth 2 (two) times daily.    . pantoprazole (PROTONIX) 40 MG tablet Take 1 tablet (40 mg total) by mouth 2 (two) times daily. 60 tablet 3  . rivaroxaban (XARELTO) 20 MG TABS tablet Take 1 tablet (20 mg total) by mouth daily. 30 tablet 0  . sacubitril-valsartan (ENTRESTO) 24-26 MG Take 1 tablet by mouth 2 (two) times daily. 60 tablet 11  . sertraline (ZOLOFT) 25 MG tablet Take 1 tablet (25 mg total) by mouth daily. 30 tablet 2  . thiamine 100 MG tablet Take 1 tablet (100 mg total) by mouth daily. 30 tablet 0   No current facility-administered medications for this encounter.   Wt Readings from Last 3 Encounters:  12/09/19 88 kg (194 lb)  12/02/19 84.8 kg (187 lb)  11/29/19 83.5 kg (184 lb)    Vitals:   12/09/19 0927  BP: 118/85  Pulse: 86  SpO2: 98%  Weight: 88 kg (194 lb)   General:  Well appearing. No resp difficulty HEENT: normal Neck: supple. JVP 6-7 Carotids 2+ bilat; no bruits. No lymphadenopathy or thryomegaly appreciated. Cor: PMI nondisplaced. Regular rate & rhythm. 2/6 TR Lungs: clear Abdomen: soft, nontender, nondistended. No hepatosplenomegaly. No  bruits or masses. Good bowel sounds. Extremities: no cyanosis, clubbing, rash, edema Neuro: alert & orientedx3, cranial nerves grossly intact. moves all 4 extremities w/o difficulty. Affect flat     ASSESSMENT & PLAN: 1.Chronic Systolic Heart Failure, NICM  - ECHO 5/21 w/ severely reduced EF 15% RV moderately reduced. Suspect ETOH and cocaine playing a role.  - LHC with no coronary disease. 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 Will refer for genetic testing - NYHA II-III. Volume status looks ok on exam but  weight up 7 pounds after cutting lasix back to 40 daily - Continue spiro to 12.5 mg bid  - Continue digoxin 0.125 mg daily  - Increase Entresto to 49/51 bid - Continue Corlanor 5 mg bid.  - Add Farxiga 10  - no  blocker yet.  - not a candidate for advanced therapies w/ polysubstance abuse  - discussed importance of abstinence from ETOH and tobacco  - encouraged low sodium diet and daily wts (has scale at home) - Refer to PharmD [facility] for med titration  - Plan to repeat ECHO in 2 months after med optimization - Continue Paramedicine  2.Pulmonary Emboli - CTA w/ small LLL PE 5/21 - LE Venous dopplers negative for DVT  - Continue xarelto.   - Check CBC today  3. ETOH Abuse - Heavy drinker for many years since the age of 62. Has had inpatient treatment in the past but started drinking again.  Martin Majestic to [facility] yesterday and give names for outpatient ETOH programs. I encourged him to f/u with this  - SW seeing today  4. H/O Asthma  - stable   5. H/O Cocaine Abuse - No use since discharge.  - most recent UDS 10/31/19 negative   6. Pericardial Effusion - moderate circumferential effusion on cMRI. No definite signs of tamponade on study.  - Repeat echo 2 months   7. Tobacco Abuse - Discussed need for cessation  8. Gastritis  - Recent gastritis as outlined above, related to ETOH +  Xarelto.  - Hgb stable.  - advised to quit drinking  - continue bid Protonix for GI protection    Glori Bickers MD  9:46 AM

## 2019-12-15 ENCOUNTER — Telehealth (HOSPITAL_COMMUNITY): Payer: Self-pay

## 2019-12-15 NOTE — Telephone Encounter (Signed)
I called Robert [name] to schedule an appointment. He did not answer so I left a message requesting he cal me back.  [name], EMT 12/15/19

## 2019-12-15 NOTE — Telephone Encounter (Signed)
I called Mr Wandel to schedule an appointment. He did not answer so I left a message requesting he call me back. This is my second attempt today. I will try again tomorrow if I do not hear back from him.  [name], EMT 12/15/19

## 2019-12-15 NOTE — Telephone Encounter (Signed)
Had to send [employer] application in again, despite successful confirmation.  Will follow up.

## 2019-12-16 ENCOUNTER — Telehealth (HOSPITAL_COMMUNITY): Payer: Self-pay

## 2019-12-16 NOTE — Telephone Encounter (Signed)
I called Mr Meenach to schedule an appointment. He did not answer so I left a message requesting he call me back.   [name], EMT 12/16/19

## 2019-12-17 NOTE — Telephone Encounter (Signed)
Advanced Heart Failure Patient Advocate Encounter   Patient was approved to receive Entresto assistance from [employer].  Patient ID: [id number] Effective dates: 12/03/19 through 12/02/20  [employer] put the wrong date of birth on his profile, which explains why they thought the information was not received. I called [employer] and made sure that they have the correct DOB on his profile now. I called and left a message regarding the approval and the phone number to [employer].  Advised the patient to call me with any issues.  Charlann Boxer, CPhT

## 2019-12-21 ENCOUNTER — Telehealth (HOSPITAL_COMMUNITY): Payer: Self-pay

## 2019-12-21 NOTE — Telephone Encounter (Signed)
I called Mr Biela to schedule an appointment. He did not answer so I left a message requesting he call me back.  [name], EMT 12/21/19

## 2019-12-30 ENCOUNTER — Other Ambulatory Visit: Payer: Self-pay

## 2019-12-30 ENCOUNTER — Encounter (HOSPITAL_COMMUNITY): Payer: Self-pay

## 2019-12-30 ENCOUNTER — Other Ambulatory Visit (HOSPITAL_COMMUNITY): Payer: Self-pay

## 2019-12-30 ENCOUNTER — Ambulatory Visit (HOSPITAL_COMMUNITY)
Admission: RE | Admit: 2019-12-30 | Discharge: 2019-12-30 | Disposition: A | Discharge status: Home / Self Care | Payer: BC Managed Care – PPO | Source: Ambulatory Visit | Attending: Internal Medicine | Admitting: Internal Medicine

## 2019-12-30 VITALS — BP 126/82 | HR 97 | Wt 196.2 lb

## 2019-12-30 DIAGNOSIS — Z720 Tobacco use: Secondary | ICD-10-CM | POA: Insufficient documentation

## 2019-12-30 DIAGNOSIS — F1010 Alcohol abuse, uncomplicated: Secondary | ICD-10-CM | POA: Insufficient documentation

## 2019-12-30 DIAGNOSIS — I313 Pericardial effusion (noninflammatory): Secondary | ICD-10-CM | POA: Insufficient documentation

## 2019-12-30 DIAGNOSIS — I428 Other cardiomyopathies: Secondary | ICD-10-CM | POA: Insufficient documentation

## 2019-12-30 DIAGNOSIS — Z79899 Other long term (current) drug therapy: Secondary | ICD-10-CM | POA: Diagnosis not present

## 2019-12-30 DIAGNOSIS — K2970 Gastritis, unspecified, without bleeding: Secondary | ICD-10-CM | POA: Insufficient documentation

## 2019-12-30 DIAGNOSIS — Z7901 Long term (current) use of anticoagulants: Secondary | ICD-10-CM | POA: Diagnosis not present

## 2019-12-30 DIAGNOSIS — J45909 Unspecified asthma, uncomplicated: Secondary | ICD-10-CM | POA: Insufficient documentation

## 2019-12-30 DIAGNOSIS — I5022 Chronic systolic (congestive) heart failure: Secondary | ICD-10-CM | POA: Diagnosis present

## 2019-12-30 DIAGNOSIS — Z86711 Personal history of pulmonary embolism: Secondary | ICD-10-CM | POA: Diagnosis not present

## 2019-12-30 LAB — BASIC METABOLIC PANEL
Anion gap: 12 (ref 5–15)
BUN: 23 mg/dL — ABNORMAL HIGH (ref 6–20)
CO2: 21 mmol/L — ABNORMAL LOW (ref 22–32)
Calcium: 9.3 mg/dL (ref 8.9–10.3)
Chloride: 100 mmol/L (ref 98–111)
Creatinine, Ser: 1.34 mg/dL — ABNORMAL HIGH (ref 0.61–1.24)
GFR calc Af Amer: >60 mL/min (ref >60)
GFR calc non Af Amer: >60 mL/min (ref >60)
Glucose, Bld: 142 mg/dL — ABNORMAL HIGH (ref 70–99)
Potassium: 4.1 mmol/L (ref 3.5–5.1)
Sodium: 133 mmol/L — ABNORMAL LOW (ref 135–145)

## 2019-12-30 LAB — BRAIN NATRIURETIC PEPTIDE: B Natriuretic Peptide: 262.2 pg/mL — ABNORMAL HIGH (ref 0.0–100.0)

## 2019-12-30 MED ORDER — SACUBITRIL-VALSARTAN 97-103 MG PO TABS: 1.0000 | ORAL_TABLET | Freq: Two times a day (BID) | ORAL | 3 refills | Status: DC

## 2019-12-30 MED ORDER — SPIRONOLACTONE 25 MG PO TABS
25.0000 mg | ORAL_TABLET | Freq: Every day | ORAL | 3 refills | Status: DC
Start: 2019-12-30 — End: 2020-03-24

## 2019-12-30 NOTE — Progress Notes (Signed)
HPI: PCP: Primary Cardiologist: Dr Haroldine Laws    HPI: 39 y/o male with h/oheavyETOH and recent cocaine use, PE, diagnosed biventricular heart failure in 5/21.   Admitted with acute onset HF with severe biventricular failure in 10/2019. EF 15%. RV down. He was drinking at least 2 beers every night and about 12-24 over the weekend. Also drinking 1/5 of liquor every other day. Cath shown normal coronaries. Elevated filling pressures with normal output. Placed on milrinone and diuresed with IV lasix. As he improved milrinone was weaned off . Transitioned oral lasix. 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 CT shows small LLL PE. Placed on xarelto. Started on HF meds.  Discharge weight 178 pounds.   Had initial post hospital f/u 6/14 and was volume overloaded. Given IV furosemide in clinic and home furosemide increased to 80 mg daily.   Readmitted by [facility] and discharged 11/02/19. Admitted for mild acute blood loss related anemia due to ongoing most likely upper GI bleed caused from gastritis in the setting of Xarelto use. This likely has been brought on by excessive alcohol intake. He was treated with bowel rest, IV fluids and IV PPI. He was seen by GI and underwent EGD showing no active bleeding but evidence of some gastritis. He was switched to oral PPI twice daily, H&H remained stable and he did not require any transfusions. He was instructed to continue Xarelto.   Multiple ER visits for chest pain on  7/4, 7/10, and 11/21/19. His troponin was low on all 3 occasions. He had started drinking alcohol again and was thinking he was going to withdrawl from alcohol.  Returned for HF Clinic for follow up with Dr. Haroldine Laws on 12/09/19. Continued to have episodes of chest pain. Was still drinking 3-4 little bottles of Vodka daily. Said he stopped cocaine. Compliant with meds. Went to [employer] 12/08/19 and was given names for outpatient ETOH programs. Mild DOE. Weight was up 7 pounds. No orthopnea  or PND.   Today he returns to HF clinic for pharmacist medication titration. At last visit with MD, dapagliflozin 10 mg daily was started and Entresto was increased to 49/51 mg BID. Still taking spironolactone 12.5 mg BID despite its removal from chart 12/09/19 (was removed in error).  Feels OK today, says he stopped drinking a few days ago and reports mild tremors ever since. Endorses occasional dizziness when ambulating which wasn't correlated with Entresto increase. Denies chest pain or palpitations. No SOB or DOE. Home weights stable at 192-194 lbs on furosemide 40 mg daily. No LEE on exam. No PND or orthopnea. Poor appetite which is not new. Noted he only eats 1-2 meals daily. Reports adherence to low salt diet. Tolerating all medications. Mentioned trouble obtaining medications, provided copay cards for Entresto, ivabradine and dapagliflozin.   HF Medications: Entresto 49-51 mg BID Spironolactone 12.5 mg BID  Dapagliflozin 10 mg daily Ivabradine 5 mg BID  Digoxin 0.125 mg daily Furosemide 40 mg daily  Has the patient been experiencing any side effects to the medications prescribed?  no  Does the patient have any problems obtaining medications due to transportation or finances?   No - BCBS prescription insurance. Provided copay card for Entresto, Ivabradine and dapagliflozin today. Notably he is approved for [employer]), Xarelto ([employer]) and Research scientist (physical sciences)) assistance because he thought he was going to lose his insurance.   Understanding of regimen: good Understanding of indications: good Potential of compliance: good Patient understands to avoid NSAIDs. Patient understands to avoid decongestants.  Pertinent Lab Values:  Serum creatinine 1.34, BUN 23, Potassium 4.1, Sodium 133, BNP 262.2 pg/mL, Magnesium 1.6 (11/02/19), Digoxin 0.5 ng/mL (11/03/19)   Vital Signs: . Weight: 196.2 lbs (last clinic weight: 194 lbs) . Blood pressure: 126/82   . Heart rate: 97  bpm  Assessment: 1.Chronic Systolic Heart Failure, NICM  - ECHO 5/21 w/ severely reduced EF 15% RV moderately reduced. Suspect ETOH and cocaine playing a role.  - LHC with no coronary disease. 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 Will refer for genetic testing - NYHA II-III. Euvolemic on exam. - no Beta blocker yet due to low cardiac output and poor RV function - Increase Entresto to 97/103 mg BID - Change spironolactone to 25 mg daily to improve compliance per patient request (previous prescription was for 12.5 mg BID). - Continue dapagliflozin 10 mg daily - Continue Digoxin 0.125 mg daily  - Continue ivabradine 5 mg BID  - not a candidate for advanced therapies w/ polysubstance abuse  - Encouraged low sodium diet and daily weights (has scale at home) - Plan to repeat ECHO 03/10/20 after med optimization - Continue Paramedicine  2.Pulmonary Emboli - CTA w/ small LLL PE 5/21 - LE Venous dopplers negative for DVT  - Continue xarelto.    3. ETOH Abuse - Heavy drinker for many years since the age of 62. Has had inpatient treatment in the past but started drinking again.  Martin Majestic to [employer] 12/08/19 and was given names for outpatient ETOH programs. Dr. Haroldine Laws encouraged him to f/u with this. - SW seen 12/09/19  4. H/O Asthma  - Stable   5. H/O Cocaine Abuse - No use since discharge.  - Most recent UDS 10/31/19 negative   6. Pericardial Effusion - Moderate circumferential effusion on cMRI. No definite signs of tamponade on study.  - Repeat echo 03/10/20   7. Tobacco Abuse - Need for cessation previously discussed  8. Gastritis  - Recent gastritis as outlined above, related to ETOH + Xarelto.  - Hgb stable 12/09/19 - Previously advised to quit drinking per Dr. Haroldine Laws. Patient noted he has stopped drinking.  - continue pantoprazole BID for GI protection    Plan: 1) Medication  changes: Based on clinical presentation, vital signs and recent labs will increase Entresto to 97/103 mg BID. 2) Follow-up: Pharmacy Clinic in 2 weeks    Audry Riles, PharmD, BCPS, BCCP, CPP Heart Failure Clinic Pharmacist [phone]

## 2019-12-30 NOTE — Progress Notes (Signed)
Paramedicine Encounter   Patient ID: Ardell Makarewicz , male,   DOB: 1981/04/16,39 y.o.,  MRN: [id number]  Mr Dwan was seen in the clinic today following his visit with the pharmacy team. He stated he was doing OK having not had any alcohol in the past two days and was suffering from tremors as a result. He stated he wanted to go to an inpatient facility to stop drinking but was concerned about returning to work by the end of next month. He stated that he was told that if he does not return to work soon [employer] was going to fill his position. I advised him to reach out to his HR department at [employer] and apply for FMLA to [employer] his job during this time. He asked that I write that down so he would know what to say when he got on the phone so I noted it at the top of his discharge summary. We spoke about my inability reaching him over the last few weeks and he stated he did not have my number saved so he didn't answer when I called. I had him save it while we were sitting in the room together and he assured me he would answer my calls moving forward. I will follow up next week to check for compliance with his Entresto dose change.   [name], EMT 12/30/2019   ACTION: Next visit planned for 1 week

## 2019-12-30 NOTE — Patient Instructions (Signed)
It was a pleasure seeing you today!  MEDICATIONS: -We are changing your medications today -Increase Entreso to 97/103 mg (1 tablet) twice daily -Call if you have questions about your medications.  LABS: -We will call you if your labs need attention.  NEXT APPOINTMENT: Return to clinic in 2 weeks with Pharmacy Clinic.  In general, to take care of your heart failure: -Limit your fluid intake to 2 Liters (half-gallon) per day.   -Limit your salt intake to ideally 2-3 grams (2000-3000 mg) per day. -Weigh yourself daily and record, and bring that "weight diary" to your next appointment.  (Weight gain of 2-3 pounds in 1 day typically means fluid weight.) -The medications for your heart are to help your heart and help you live longer.   -Please contact us before stopping any of your heart medications.  Call the clinic at [phone] with questions or to reschedule future appointments.

## 2019-12-31 NOTE — Progress Notes (Addendum)
HPI: PCP: Primary Cardiologist: Dr Haroldine Laws    HPI: 39 y/o male with h/oheavyETOH and recent cocaine use, PE, diagnosed biventricular heart failure in 5/21.   Admitted with acute onset HF with severe biventricular failure in 10/2019. EF 15%. RV down. He was drinking at least 2 beers every night and about 12-24 over the weekend. Also drinking 1/5 of liquor every other day. Cath shown normal coronaries. Elevated filling pressures with normal output. Placed on milrinone and diuresed with IV lasix. As he improved milrinone was weaned off . Transitioned oral lasix. 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 CT shows small LLL PE. Placed on xarelto. Started on HF meds.  Discharge weight 178 pounds.   Had initial post hospital f/u 6/14 and was volume overloaded. Given IV furosemide in clinic and home furosemide increased to 80 mg daily.   Readmitted by [facility] and discharged 11/02/19. Admitted for mild acute blood loss related anemia due to ongoing most likely upper GI bleed caused from gastritis in the setting of Xarelto use. This likely has been brought on by excessive alcohol intake. He was treated with bowel rest, IV fluids and IV PPI. He was seen by GI and underwent EGD showing no active bleeding but evidence of some gastritis. He was switched to oral PPI twice daily, H&H remained stable and he did not require any transfusions. He was instructed to continue Xarelto.   Multiple ER visits for chest pain on  7/4, 7/10, and 11/21/19. His troponin was low on all 3 occasions. He had started drinking alcohol again and was thinking he was going to withdrawl from alcohol.  Returned for HF Clinic for follow up with Dr. Haroldine Laws on 12/09/19. Continued to have episodes of chest pain. Was still drinking 3-4 little bottles of Vodka daily. Said he stopped cocaine. Compliant with meds. Went to [facility] 12/08/19 and was given names for outpatient ETOH programs. Mild DOE. Weight was up 7 pounds. No orthopnea  or PND.   Today he returns to HF clinic for pharmacist medication titration. At recent visits to clinic, dapagliflozin 10 mg daily was started and Entresto was increased to 97/103 mg BID. Recently had ED visit on 01/10/20 for multiple episodes of vomiting (some blood streaked) and chest pain after completely stopping alcohol. EKG was unremarkable. He was given a Librium taper and discharged with outpatient resources for alcohol treatment. Of note, his Scr had doubled from 1.34 to 2.62 when presenting to ED, likely from dehydration. No changes were made.  At today's clinic visit, he is feeling ok. Has not had any more episodes of vomiting since Sunday and is doing well with the Librium taper.  No dizziness or lightheadedness. Only noted chest pain while coughing and vomiting. Has not recurred. No SOB/DOE. His weight has been stable at home. He takes furosemide 40 mg daily and has not needed any extra. No LEE, PND or orthopnea. Appetite improving but still poor.    HF Medications: Entresto 97/103 mg BID Spironolactone 25 mg daily Dapagliflozin 10 mg daily Ivabradine 5 mg BID  Digoxin 0.125 mg daily Furosemide 40 mg daily  Has the patient been experiencing any side effects to the medications prescribed?  no  Does the patient have any problems obtaining medications due to transportation or finances?   No - BCBS prescription insurance. Provided copay card for Entresto, Ivabradine and dapagliflozin today. Notably he is approved for [employer]), Xarelto ([employer]) and Research scientist (physical sciences)) assistance because he thought he was going to lose his insurance.  Understanding of regimen: good Understanding of indications: good Potential of compliance: good Patient understands to avoid NSAIDs. Patient understands to avoid decongestants.     Pertinent Lab Values:  12/30/19: Serum creatinine 1.34, BUN 23, Potassium 4.1, Sodium 133, BNP 262.2 pg/mL, Magnesium 1.6 (11/02/19), Digoxin 0.5 ng/mL  (11/03/19)  01/09/20:  Serum creatinine 2.62, BUN 49, Potassium 4.6, Sodium 135,   01/13/20: Serum creatinine 3.45, BUN 54, Potassium 4.1, Sodium 138  Vital Signs: . Weight: 189 lbs (last clinic weight: 196.2 lbs) . Blood pressure: 102/62 . Heart rate: 86 bpm  Assessment: 1.Chronic Systolic Heart Failure, NICM  - ECHO 5/21 w/ severely reduced EF 15% RV moderately reduced. Suspect ETOH and cocaine playing a role.  - LHC with no coronary disease. 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 Will refer for genetic testing - NYHA II-III. Dry on exam. - Labs: Scr continues to rise from 1.34>2.62>3.45 on last 3 checks. BUN also trending upwards, 23>49>54. K stable at 4.1 today. Patient is dry due to poor intake recently from alcohol withdrawal and multiple vomiting episodes. Will make medication changes as below, continue to hydrate and repeat BMET in 5 days. -STOP taking furosemide.  - no Beta blocker yet due to low cardiac output and poor RV function  -HOLD Entresto for 2 days, then decrease Entresto to 49/51 mg BID - Continue spironolactone 25 mg daily  - Continue dapagliflozin 10 mg daily - Continue Digoxin 0.125 mg daily  - Continue ivabradine 5 mg BID  - not a candidate for advanced therapies w/ polysubstance abuse  - Encouraged low sodium diet and daily weights (has scale at home) - Plan to repeat ECHO 03/10/20 after med optimization - Continue Paramedicine  2.Pulmonary Emboli - CTA w/ small LLL PE 5/21 - LE Venous dopplers negative for DVT  - Continue xarelto.    3. ETOH Abuse - Heavy drinker for many years since the age of 24. Has had inpatient treatment in the past but started drinking again.  Martin Majestic to [facility] 12/08/19 and was given names for outpatient ETOH programs. Dr. Haroldine Laws encouraged him to f/u with this. - Recent ED visit 01/10/20 for alcohol withdrawal. Continue Librium taper.   4.  H/O Asthma  - Stable   5. H/O Cocaine Abuse - No use since discharge.  - Most recent UDS 10/31/19 negative   6. Pericardial Effusion - Moderate circumferential effusion on cMRI. No definite signs of tamponade on study.  - Repeat echo 03/10/20   7. Tobacco Abuse - Need for cessation previously discussed  8. Gastritis  - Recent gastritis as outlined above, related to ETOH + Xarelto.  - Hgb stable 12/09/19 - Previously advised to quit drinking per Dr. Haroldine Laws. Patient noted he has stopped drinking.  - continue pantoprazole BID for GI protection    Plan: 1) Medication changes: Based on clinical presentation, vital signs and recent labs will STOP furosemide and hold Entresto for 2 days, then decrease to 49/51 mg BID. Repeat BMET in 5 days. Instructed to hydrate well.  2) Follow-up: Pharmacy Clinic in 2 weeks    Audry Riles, PharmD, BCPS, BCCP, CPP Heart Failure Clinic Pharmacist [phone]

## 2020-01-05 ENCOUNTER — Other Ambulatory Visit (HOSPITAL_COMMUNITY): Payer: Self-pay | Admitting: Cardiology

## 2020-01-05 ENCOUNTER — Other Ambulatory Visit (HOSPITAL_COMMUNITY): Payer: Self-pay

## 2020-01-05 MED ORDER — PANTOPRAZOLE SODIUM 40 MG PO TBEC: 40.0000 mg | DELAYED_RELEASE_TABLET | Freq: Two times a day (BID) | ORAL | 3 refills | Status: DC

## 2020-01-05 NOTE — Progress Notes (Signed)
Paramedicine Encounter    Patient ID: Robert [name], male    DOB: Sep 19, 1980, 39 y.o.   MRN: [id number]   Patient Care Team: Patient, No Pcp Per as PCP - General ([place]) Robert [name] as PCP - Cardiology (Cardiology)  Patient Active Problem List   Diagnosis Date Noted  . AKI (acute kidney injury) ([place]) 10/30/2019  . Gastritis 10/30/2019  . Elevated INR 10/30/2019  . 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[place])   . Acute heart failure ([place]) 10/05/2019  . Pulmonary embolism ([place]) 10/04/2019  . Acute pulmonary embolism ([place]) 10/04/2019  . Chronic systolic CHF (congestive heart failure) ([place])   . Inappropriate sinus tachycardia   . Chest pain 11/03/2013  . Tobacco abuse 11/03/2013    Current Outpatient Medications:  .  dapagliflozin propanediol (FARXIGA) 10 MG TABS tablet, Take 1 tablet (10 mg total) by mouth daily before breakfast., Disp: 30 tablet, Rfl: 6 .  digoxin (LANOXIN) 0.125 MG tablet, Take 1 tablet (0.125 mg total) by mouth daily., Disp: 30 tablet, Rfl: 5 .  folic acid (FOLVITE) 1 MG tablet, Take 1 tablet (1 mg total) by mouth daily., Disp: 30 tablet, Rfl: 3 .  furosemide (LASIX) 40 MG tablet, Take 1 tablet (40 mg total) by mouth daily., Disp: 30 tablet, Rfl: 3 .  ivabradine (CORLANOR) 5 MG TABS tablet, Take 1 tablet (5 mg total) by mouth 2 (two) times daily with a meal., Disp: 60 tablet, Rfl: 5 .  rivaroxaban (XARELTO) 20 MG TABS tablet, Take 1 tablet (20 mg total) by mouth daily., Disp: 30 tablet, Rfl: 0 .  sacubitril-valsartan (ENTRESTO) 97-103 MG, Take 1 tablet by mouth 2 (two) times daily., Disp: 180 tablet, Rfl: 3 .  sertraline (ZOLOFT) 25 MG tablet, Take 1 tablet (25 mg total) by mouth daily., Disp: 30 tablet, Rfl: 2 .  spironolactone (ALDACTONE) 25 MG tablet, Take 1 tablet (25 mg total) by mouth daily., Disp: 90 tablet, Rfl: 3 .  thiamine 100 MG tablet, Take 1 tablet (100 mg total) by mouth daily., Disp: 30 tablet, Rfl: 0 .   Magnesium Oxide (MAG-200) 200 MG TABS, Take 200 mg by mouth 2 (two) times daily., Disp: , Rfl:  .  pantoprazole (PROTONIX) 40 MG tablet, Take 1 tablet (40 mg total) by mouth 2 (two) times daily., Disp: 60 tablet, Rfl: 3 Allergies  Allergen Reactions  . Bactrim [Sulfamethoxazole-Trimethoprim] Rash      Social History   Socioeconomic History  . Marital status: Single    Spouse name: Not on file  . Number of children: Not on file  . Years of education: Not on file  . Highest education level: Not on file  Occupational History  . Not on file  Tobacco Use  . Smoking status: Current Every Day Smoker    Packs/day: 0.50    Types: Cigarettes  . Smokeless tobacco: Never Used  Vaping Use  . Vaping Use: Former  Substance and Sexual Activity  . Alcohol use: Yes  . Drug use: Yes    Types: Marijuana  . Sexual activity: Not on file  Other Topics Concern  . Not on file  Social History Narrative  . Not on file   Social Determinants of Health   Financial Resource Strain: High Risk  . Difficulty of Paying Living Expenses: Very hard  Food Insecurity: Food Insecurity Present  . Worried About Charity fundraiser in the Last Year: Sometimes true  . Ran Out of Food in the Last  Year: Sometimes true  Transportation Needs:   . Lack of Transportation (Medical): Not on file  . Lack of Transportation (Non-Medical): Not on file  Physical Activity:   . Days of Exercise per Week: Not on file  . Minutes of Exercise per Session: Not on file  Stress:   . Feeling of Stress : Not on file  Social Connections:   . Frequency of Communication with Friends and Family: Not on file  . Frequency of Social Gatherings with Friends and Family: Not on file  . Attends Religious Services: Not on file  . Active Member of Clubs or Organizations: Not on file  . Attends Archivist Meetings: Not on file  . Marital Status: Not on file  Intimate Partner Violence:   . Fear of Current or Ex-Partner: Not on  file  . Emotionally Abused: Not on file  . Physically Abused: Not on file  . Sexually Abused: Not on file    Physical Exam Cardiovascular:     Rate and Rhythm: Normal rate and regular rhythm.     Pulses: Normal pulses.  Pulmonary:     Effort: Pulmonary effort is normal.     Breath sounds: Normal breath sounds.  Musculoskeletal:        General: Normal range of motion.     Right lower leg: No edema.     Left lower leg: No edema.  Skin:    General: Skin is warm and dry.     Capillary Refill: Capillary refill takes less than 2 seconds.  Neurological:     Mental Status: He is alert and oriented to person, place, and time.  Psychiatric:        Mood and Affect: Mood normal.         Future Appointments  Date Time Provider [place]  01/13/2020 10:00 AM [facility] PHARMACY [facility] None  01/27/2020 10:00 AM [facility] PHARMACY [facility] None  03/10/2020 10:00 AM [facility] ECHO OP 1 [facility]  03/10/2020 11:00 AM Bensimhon, Shaune Pascal, [name] [facility] None    BP 110/77 (BP Location: Left Arm, Patient Position: Sitting, Cuff Size: Normal)   Pulse 95   Resp 16   Wt 189 lb (85.7 kg)   SpO2 99%   BMI 27.12 kg/m   Weight yesterday- did not weigh  Last visit weight- n/a  Robert [name] was seen at home today and reported feeling unwell. He denied chest pain, SOB, headache, dizziness, orthopnea, fever or cough over the past week but did complain of nausea and vomiting this morning. He reported being compliant with his medications over the past week however he had not picked up his new prescriptions from the pharmacy. We spent a significant amount of time getting his co-pay cards activated for Robert [name] and Robert [name] and I spoke with Robert [name] about getting him signed up for Xarelto co-pay assistance as well. His medications were verified and a new pillbox was filled for him. I also contacted the pharmacy and ordered all necessary refills which will be ready for pick up tomorrow. Robert [name] was understanding  and stated he would get them tomorrow and would let me know if he has any problems. He has returned to drinking after a few days sober last week. He stated he started drinking again after his tremors became too difficult to control. He is reluctant to seek inpatient rehab but is willing to see a substance use specialist. I will work to find someone for him in the coming week.   Toula Moos  Earleen Newport, EMT 01/05/20  ACTION: Home visit completed Next visit planned for 1 week

## 2020-01-06 ENCOUNTER — Telehealth (HOSPITAL_COMMUNITY): Payer: Self-pay | Admitting: Pharmacy Technician

## 2020-01-06 NOTE — Telephone Encounter (Signed)
Patient now has insurance. Was able to activate a $10 Xarelto co-pay card for him.  BIN [id number]  Group [id number]  ID [id number]  This information has been provided to [place] (EMT) who helps the patient with obtaining his medication.  Charlann Boxer, CPhT

## 2020-01-07 ENCOUNTER — Telehealth (HOSPITAL_COMMUNITY): Payer: Self-pay | Admitting: Pharmacy Technician

## 2020-01-07 NOTE — Telephone Encounter (Signed)
Patient Advocate Encounter   Received notification from [employer] that prior authorization for Pantoprazole is required.   PA submitted on CoverMyMeds Key  [id number] Status is pending   Will continue to follow.

## 2020-01-09 ENCOUNTER — Other Ambulatory Visit: Payer: Self-pay

## 2020-01-09 ENCOUNTER — Emergency Department (HOSPITAL_COMMUNITY)
Admission: EM | Admit: 2020-01-09 | Discharge: 2020-01-10 | Disposition: A | Discharge status: Home / Self Care | Payer: BC Managed Care – PPO | Attending: Emergency Medicine | Admitting: Emergency Medicine

## 2020-01-09 ENCOUNTER — Emergency Department (HOSPITAL_COMMUNITY): Payer: BC Managed Care – PPO

## 2020-01-09 ENCOUNTER — Encounter (HOSPITAL_COMMUNITY): Payer: Self-pay

## 2020-01-09 ENCOUNTER — Encounter (HOSPITAL_COMMUNITY): Payer: Self-pay | Admitting: Emergency Medicine

## 2020-01-09 DIAGNOSIS — R0789 Other chest pain: Secondary | ICD-10-CM | POA: Insufficient documentation

## 2020-01-09 DIAGNOSIS — F17210 Nicotine dependence, cigarettes, uncomplicated: Secondary | ICD-10-CM | POA: Insufficient documentation

## 2020-01-09 DIAGNOSIS — I5022 Chronic systolic (congestive) heart failure: Secondary | ICD-10-CM | POA: Insufficient documentation

## 2020-01-09 DIAGNOSIS — R112 Nausea with vomiting, unspecified: Secondary | ICD-10-CM | POA: Diagnosis not present

## 2020-01-09 DIAGNOSIS — R079 Chest pain, unspecified: Secondary | ICD-10-CM | POA: Diagnosis not present

## 2020-01-09 DIAGNOSIS — Z5321 Procedure and treatment not carried out due to patient leaving prior to being seen by health care provider: Secondary | ICD-10-CM | POA: Insufficient documentation

## 2020-01-09 DIAGNOSIS — Z79899 Other long term (current) drug therapy: Secondary | ICD-10-CM | POA: Insufficient documentation

## 2020-01-09 DIAGNOSIS — J45909 Unspecified asthma, uncomplicated: Secondary | ICD-10-CM | POA: Insufficient documentation

## 2020-01-09 LAB — BASIC METABOLIC PANEL
Anion gap: 19 — ABNORMAL HIGH (ref 5–15)
BUN: 49 mg/dL — ABNORMAL HIGH (ref 6–20)
CO2: 20 mmol/L — ABNORMAL LOW (ref 22–32)
Calcium: 9.5 mg/dL (ref 8.9–10.3)
Chloride: 96 mmol/L — ABNORMAL LOW (ref 98–111)
Creatinine, Ser: 2.62 mg/dL — ABNORMAL HIGH (ref 0.61–1.24)
GFR calc Af Amer: 34 mL/min — ABNORMAL LOW (ref >60)
GFR calc non Af Amer: 30 mL/min — ABNORMAL LOW (ref >60)
Glucose, Bld: 144 mg/dL — ABNORMAL HIGH (ref 70–99)
Potassium: 4.6 mmol/L (ref 3.5–5.1)
Sodium: 135 mmol/L (ref 135–145)

## 2020-01-09 LAB — CBC
HCT: 36 % — ABNORMAL LOW (ref 39.0–52.0)
Hemoglobin: 11.8 g/dL — ABNORMAL LOW (ref 13.0–17.0)
MCH: 29.1 pg (ref 26.0–34.0)
MCHC: 32.8 g/dL (ref 30.0–36.0)
MCV: 88.7 fL (ref 80.0–100.0)
Platelets: 219 10*3/uL (ref 150–400)
RBC: 4.06 MIL/uL — ABNORMAL LOW (ref 4.22–5.81)
RDW: 16.7 % — ABNORMAL HIGH (ref 11.5–15.5)
WBC: 3.8 10*3/uL — ABNORMAL LOW (ref 4.0–10.5)
nRBC: 0 % (ref 0.0–0.2)

## 2020-01-09 LAB — TROPONIN I (HIGH SENSITIVITY): Troponin I (High Sensitivity): 16 ng/L (ref <18)

## 2020-01-09 NOTE — ED Triage Notes (Signed)
Pt coming in c/o chest pain, flank pain and alcohol withdrawal. Last drink was yesterday. Usually drinks 4-5 airplane bottles every other day. Hx of heart failure

## 2020-01-09 NOTE — ED Triage Notes (Signed)
Pt to eD via [facility] with c/o chest pain earlier today.  Pt also c/o nausea and vomiting   Unable to keep anything down.  Pt st's he drinks everyday but has not had anything today because he can't keep it down

## 2020-01-10 ENCOUNTER — Emergency Department (HOSPITAL_COMMUNITY)
Admission: EM | Admit: 2020-01-10 | Discharge: 2020-01-10 | Disposition: A | Discharge status: Home / Self Care | Payer: BC Managed Care – PPO | Source: Home / Self Care | Attending: Emergency Medicine | Admitting: Emergency Medicine

## 2020-01-10 DIAGNOSIS — F10930 Alcohol use, unspecified with withdrawal, uncomplicated: Secondary | ICD-10-CM

## 2020-01-10 DIAGNOSIS — R079 Chest pain, unspecified: Secondary | ICD-10-CM

## 2020-01-10 DIAGNOSIS — F10230 Alcohol dependence with withdrawal, uncomplicated: Secondary | ICD-10-CM

## 2020-01-10 DIAGNOSIS — K2900 Acute gastritis without bleeding: Secondary | ICD-10-CM

## 2020-01-10 DIAGNOSIS — R109 Unspecified abdominal pain: Secondary | ICD-10-CM

## 2020-01-10 MED ORDER — ONDANSETRON HCL 4 MG PO TABS: 4.0000 mg | ORAL_TABLET | Freq: Three times a day (TID) | ORAL | 0 refills | Status: DC | PRN

## 2020-01-10 MED ORDER — ONDANSETRON HCL 4 MG/2ML IJ SOLN
4.0000 mg | Freq: Once | INTRAMUSCULAR | Status: AC
  Administered 2020-01-10: 4 mg via INTRAVENOUS
  Filled 2020-01-10: qty 2

## 2020-01-10 MED ORDER — PANTOPRAZOLE SODIUM 40 MG PO TBEC
40.0000 mg | DELAYED_RELEASE_TABLET | Freq: Once | ORAL | Status: AC
  Administered 2020-01-10: 40 mg via ORAL
  Filled 2020-01-10: qty 1

## 2020-01-10 MED ORDER — LORAZEPAM 2 MG/ML IJ SOLN
1.0000 mg | Freq: Once | INTRAMUSCULAR | Status: AC
  Administered 2020-01-10: 1 mg via INTRAVENOUS
  Filled 2020-01-10: qty 1

## 2020-01-10 MED ORDER — CHLORDIAZEPOXIDE HCL 25 MG PO CAPS
50.0000 mg | ORAL_CAPSULE | Freq: Once | ORAL | Status: AC
  Administered 2020-01-10: 50 mg via ORAL
  Filled 2020-01-10: qty 2

## 2020-01-10 MED ORDER — ESOMEPRAZOLE MAGNESIUM 40 MG PO CPDR: 40.0000 mg | DELAYED_RELEASE_CAPSULE | Freq: Every day | ORAL | 6 refills | Status: DC

## 2020-01-10 MED ORDER — SODIUM CHLORIDE 0.9 % IV BOLUS: 1000.0000 mL | Freq: Once | INTRAVENOUS | Status: DC

## 2020-01-10 MED ORDER — FAMOTIDINE 20 MG PO TABS: 20.0000 mg | ORAL_TABLET | Freq: Two times a day (BID) | ORAL | 0 refills | Status: DC

## 2020-01-10 MED ORDER — ALUM & MAG HYDROXIDE-SIMETH 200-200-20 MG/5ML PO SUSP
30.0000 mL | Freq: Once | ORAL | Status: AC
  Administered 2020-01-10: 30 mL via ORAL
  Filled 2020-01-10: qty 30

## 2020-01-10 MED ORDER — CHLORDIAZEPOXIDE HCL 25 MG PO CAPS: ORAL_CAPSULE | ORAL | 0 refills | Status: DC

## 2020-01-10 MED ORDER — LACTATED RINGERS IV BOLUS
500.0000 mL | Freq: Once | INTRAVENOUS | Status: AC
  Administered 2020-01-10: 500 mL via INTRAVENOUS

## 2020-01-10 NOTE — ED Provider Notes (Addendum)
[facility] DEPT Provider Note   CSN: [id number] Arrival date & time: 01/09/20  2314     History Chief Complaint  Patient presents with  . Chest Pain    Robert [name] is a 39 y.o. male.  Here with mother who helps supplement history and utilized medical records for same.  Sounds like he is a daily drinker, has not had any alcohol for a couple days and is now having multiple episodes of vomiting becoming blood-streaked after awhile, now having central sharp chest pain.  Went to [facility] earlier today and had labs drawn and EKG done which were unremarkable besides an increase in his creatinine.  Patient still with the symptoms now.  States he does want to quit drinking.  He has a history of smoking cigarettes and smoking marijuana intermittently.  He has a history of cocaine use but has not used in the last few weeks.   Chest Pain      Past Medical History:  Diagnosis Date  . Alcohol use   . Asthma   . CHF (congestive heart failure) [facility])     Patient Active Problem List   Diagnosis Date Noted  . AKI (acute kidney injury) ([place]) 10/30/2019  . Gastritis 10/30/2019  . Elevated INR 10/30/2019  . 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[place])   . Acute heart failure ([place]) 10/05/2019  . Pulmonary embolism ([place]) 10/04/2019  . Acute pulmonary embolism ([place]) 10/04/2019  . Chronic systolic CHF (congestive heart failure) ([place])   . Inappropriate sinus tachycardia   . Chest pain 11/03/2013  . Tobacco abuse 11/03/2013    Past Surgical History:  Procedure Laterality Date  . ESOPHAGOGASTRODUODENOSCOPY (EGD) WITH PROPOFOL N/A 11/01/2019   Procedure: ESOPHAGOGASTRODUODENOSCOPY (EGD) WITH PROPOFOL;  Surgeon: [name], MD;  [facility] END[facility]COPY;  Service: Endoscopy;  Laterality: N/A;  . RIGHT/LEFT HEART CATH AND CORONARY ANGIOGRAPHY N/A 10/06/2019   Procedure: RIGHT/LEFT HEART CATH AND CORONARY ANGIOGRAPHY;  Surgeon: [name], MD;  Location: [facility] CV LAB;  Service: Cardiovascular;  Laterality: N/A;  . WRIST SURGERY         Family History  Problem Relation Age of Onset  . Heart failure Father   . Diabetes Father   . Kidney disease Father     Social History   Tobacco Use  . Smoking status: Current Every Day Smoker    Packs/day: 0.50    Types: Cigarettes  . Smokeless tobacco: Never Used  Vaping Use  . Vaping Use: Former  Substance Use Topics  . Alcohol use: Yes  . Drug use: Yes    Types: Marijuana    Home Medications Prior to Admission medications   Medication Sig Start Date End Date Taking? Authorizing Provider  dapagliflozin propanediol (FARXIGA) 10 MG TABS tablet Take 1 tablet (10 mg total) by mouth daily before breakfast. 12/09/19   Bensimhon, Shaune Pascal, MD  digoxin (LANOXIN) 0.125 MG tablet Take 1 tablet (0.125 mg total) by mouth daily. 10/14/19   Lyda Jester M, PA-C  folic acid (FOLVITE) 1 MG tablet Take 1 tablet (1 mg total) by mouth daily. 11/08/19   Clegg, Amy D, NP  furosemide (LASIX) 40 MG tablet Take 1 tablet (40 mg total) by mouth daily. 11/25/19   Clegg, Amy D, NP  ivabradine (CORLANOR) 5 MG TABS tablet Take 1 tablet (5 mg total) by mouth 2 (two) times daily with a meal. 10/13/19   Consuelo Pandy, PA-C  Magnesium Oxide (  MAG-200) 200 MG TABS Take 200 mg by mouth 2 (two) times daily.    [provider]  pantoprazole (PROTONIX) 40 MG tablet Take 1 tablet (40 mg total) by mouth 2 (two) times daily. 01/05/20   Larey Dresser, MD  rivaroxaban (XARELTO) 20 MG TABS tablet Take 1 tablet (20 mg total) by mouth daily. 11/03/19   Thurnell Lose, MD  sacubitril-valsartan (ENTRESTO) 97-103 MG Take 1 tablet by mouth 2 (two) times daily. 12/30/19   Bensimhon, Shaune Pascal, MD  sertraline (ZOLOFT) 25 MG tablet Take 1 tablet (25 mg total) by mouth daily. 11/25/19 11/24/20  Clegg, Amy D, NP  spironolactone (ALDACTONE) 25 MG tablet Take 1 tablet (25 mg total) by mouth daily.  12/30/19   Bensimhon, Shaune Pascal, MD  thiamine 100 MG tablet Take 1 tablet (100 mg total) by mouth daily. 11/03/19   Thurnell Lose, MD    Allergies    Bactrim [sulfamethoxazole-trimethoprim]  Review of Systems   Review of Systems  Cardiovascular: Positive for chest pain.  All other systems reviewed and are negative.   Physical Exam Updated Vital Signs BP 126/78 (BP Location: Left Arm)   Pulse 87   Temp 98.9 F (37.2 C) (Oral)   Resp 18   Ht 5\' 10"  (1.778 m)   Wt 85.7 kg   SpO2 92%   BMI 27.12 kg/m   Physical Exam Vitals and nursing note reviewed.  Constitutional:      Appearance: He is well-developed.  HENT:     Head: Normocephalic and atraumatic.     Nose: No congestion or rhinorrhea.     Mouth/Throat:     Mouth: Mucous membranes are moist.     Pharynx: Oropharynx is clear.  Eyes:     Pupils: Pupils are equal, round, and reactive to light.  Cardiovascular:     Rate and Rhythm: Normal rate.     Heart sounds: Normal heart sounds.  Pulmonary:     Effort: Pulmonary effort is normal. No respiratory distress.  Chest:     Chest wall: No mass or tenderness.  Abdominal:     General: There is no distension.     Palpations: Abdomen is soft.  Musculoskeletal:        General: Normal range of motion.     Cervical back: Normal range of motion.  Skin:    General: Skin is warm and dry.  Neurological:     General: No focal deficit present.     Mental Status: He is alert.     Cranial Nerves: No cranial nerve deficit.     ED Results / Procedures / Treatments   Labs (all labs ordered are listed, but only abnormal results are displayed) Labs Reviewed - No data to display  EKG None  Radiology DG Chest 2 View  Result Date: 01/09/2020 CLINICAL DATA:  Chest pain. EXAM: CHEST - 2 VIEW COMPARISON:  Most recent radiograph 11/29/2019. Most recent CT 10/04/2019 FINDINGS: Mild cardiomegaly. Unchanged mediastinal contours. Choose fluid in the fissures without large subpulmonic  effusion. No pulmonary edema. No focal airspace disease. Stable osseous structures. IMPRESSION: Mild cardiomegaly with fluid in the fissures. Electronically Signed   By: Keith Rake M.D.   On: 01/09/2020 22:44    Procedures Procedures (including critical care time)  Medications Ordered in ED Medications  LORazepam (ATIVAN) injection 1 mg (has no administration in time range)  lactated ringers bolus 500 mL (has no administration in time range)  ondansetron (ZOFRAN) injection 4 mg (has  no administration in time range)  pantoprazole (PROTONIX) EC tablet 40 mg (has no administration in time range)  alum & mag hydroxide-simeth (MAALOX/MYLANTA) 200-200-20 MG/5ML suspension 30 mL (has no administration in time range)    ED Course  I have reviewed the triage vital signs and the nursing notes.  Pertinent labs & imaging results that were available during my care of the patient were reviewed by me and considered in my medical decision making (see chart for details).    MDM Rules/Calculators/A&P                          Work-up as above.  Low suspicion for cardiac etiology this seems GI in origin for multiple episodes of emesis.  No emesis observed here.  Tolerating p.o. for multiple hours.  Was not given a large amount of fluid secondary to his history of severe heart failure however Librium seem to help with his anxiety and his heart rate will taper it at home with some outpatient resources for alcohol treatment.  Final Clinical Impression(s) / ED Diagnoses Final diagnoses:  Abdominal pain, unspecified abdominal location  Nonspecific chest pain  Alcohol withdrawal syndrome without complication (HCC)  Acute gastritis without hemorrhage, unspecified gastritis type    Rx / DC Orders ED Discharge Orders    None       Hazelyn Kallen, Corene Cornea, MD 01/10/20 1941    Merrily Pew, MD 01/19/20 [id number]

## 2020-01-10 NOTE — ED Notes (Addendum)
Pt requested IV be taken out by sort tech in order to leave after returning from x-ray.

## 2020-01-10 NOTE — Telephone Encounter (Signed)
Patient's PA for Pantoprazole was denied. The patient needs to try and fail 2 of these preferred medications; Nexium, Prilosec, brand Aciphex sprinkle capsule, first-lansoprazole 3mg /ml suspension.   Spoke with Ander Purpura, [facility]) and we are going to take him off the Pantoprazole and try the Esomeprazole. Messaged Heather, (RN) about the change and to send the prescription. Will check the patient's co-pay once RX has been sent.  Charlann Boxer, CPhT

## 2020-01-10 NOTE — ED Notes (Signed)
Pt states they did blood work and chest xray at [facility] ED tonight

## 2020-01-12 ENCOUNTER — Telehealth (HOSPITAL_COMMUNITY): Payer: Self-pay | Admitting: Licensed Clinical Social Worker

## 2020-01-12 NOTE — Telephone Encounter (Signed)
The patient was given a prescription for Pepcid from the ED. We are going to D/C the Nexium since he is filling that. Called [facility] to ensure it went through insurance. His co-pay was $4.00.  Charlann Boxer, CPhT

## 2020-01-12 NOTE — Telephone Encounter (Signed)
CSW consulted to help pt get established with a PCP.  CSW called pt to discuss- he is agreeable to getting set up with PCP- CSW offered to make him an appt or send him a list of options and pt would prefer a list be sent and he will follow up.  CSW emailed pt a list of local providers for him to call.  CSW also inquired if pt has pursued any substance abuse resources since we last spoke.  Pt admits to not looking into anything further at this time but is still agreeable and willing for CSW to help.  CSW called [facility] who confirms they accept pt insurance and provide IOP substance abuse programming- would be free of cost to pt as he has met his insurance deductible for the year.  CSW also sent pt this information.  Will continue to follow and assist as needed  Jorge Ny, [facility] Desk#: [phone] Cell#: [phone]

## 2020-01-13 ENCOUNTER — Ambulatory Visit (HOSPITAL_COMMUNITY)
Admission: RE | Admit: 2020-01-13 | Discharge: 2020-01-13 | Disposition: A | Discharge status: Home / Self Care | Payer: BC Managed Care – PPO | Source: Ambulatory Visit | Attending: Internal Medicine | Admitting: Internal Medicine

## 2020-01-13 ENCOUNTER — Other Ambulatory Visit (HOSPITAL_COMMUNITY): Payer: Self-pay

## 2020-01-13 ENCOUNTER — Other Ambulatory Visit: Payer: Self-pay

## 2020-01-13 VITALS — BP 102/62 | HR 86 | Wt 189.0 lb

## 2020-01-13 DIAGNOSIS — I5022 Chronic systolic (congestive) heart failure: Secondary | ICD-10-CM | POA: Diagnosis present

## 2020-01-13 LAB — BASIC METABOLIC PANEL
Anion gap: 14 (ref 5–15)
BUN: 54 mg/dL — ABNORMAL HIGH (ref 6–20)
CO2: 23 mmol/L (ref 22–32)
Calcium: 9.1 mg/dL (ref 8.9–10.3)
Chloride: 101 mmol/L (ref 98–111)
Creatinine, Ser: 3.45 mg/dL — ABNORMAL HIGH (ref 0.61–1.24)
GFR calc Af Amer: 25 mL/min — ABNORMAL LOW (ref >60)
GFR calc non Af Amer: 21 mL/min — ABNORMAL LOW (ref >60)
Glucose, Bld: 107 mg/dL — ABNORMAL HIGH (ref 70–99)
Potassium: 4.1 mmol/L (ref 3.5–5.1)
Sodium: 138 mmol/L (ref 135–145)

## 2020-01-13 MED ORDER — SACUBITRIL-VALSARTAN 49-51 MG PO TABS: 1.0000 | ORAL_TABLET | Freq: Two times a day (BID) | ORAL | 11 refills | Status: DC

## 2020-01-13 MED ORDER — FUROSEMIDE 40 MG PO TABS
20.0000 mg | ORAL_TABLET | Freq: Every day | ORAL | 6 refills | Status: DC
Start: 2020-01-13 — End: 2020-01-13

## 2020-01-13 MED ORDER — FUROSEMIDE 40 MG PO TABS
40.0000 mg | ORAL_TABLET | Freq: Every day | ORAL | 6 refills | Status: DC | PRN
Start: 2020-01-13 — End: 2020-03-08

## 2020-01-13 NOTE — Progress Notes (Signed)
CSW met with pt during pharmacy visit to check in regarding resources CSW had sent pt yesterday.  Pt reports he had not looked at provided resources yet so CSW offered to assist with making calls- pt agreeable.  CSW and pt called the [facility] together and patient spoke to representative regarding IOP rehab- will have in person assessment at 10am tomorrow at the [facility] to discuss options and finalize enrollment if pt is appropriate.  CSW and pt then called local PCP offices and able to get him an establish a PCP visit for 9/28 at 2pm  Will plan to check in with pt tomorrow morning to ensure he remembers his rehab assessment at [facility] [place].  Jorge Ny, LCSW Clinical Social Worker [facility] Desk#: [phone] Cell#: [phone]

## 2020-01-13 NOTE — Patient Instructions (Addendum)
It was a pleasure seeing you today!  MEDICATIONS: -We are changing your medications today  -Do not take any Entresto for 2 days, then decrease Entresto to 49/51 mg twice daily.  -Stop taking furosemide -Keep hydrating with water -Call if you have questions about your medications.   NEXT APPOINTMENT: Return to clinic in 2 weeks with Pharmacy [facility].  In general, to take care of your heart failure: -Limit your fluid intake to 2 Liters (half-gallon) per day.   -Limit your salt intake to ideally 2-3 grams (2000-3000 mg) per day. -Weigh yourself daily and record, and bring that "weight diary" to your next appointment.  (Weight gain of 2-3 pounds in 1 day typically means fluid weight.) -The medications for your heart are to help your heart and help you live longer.   -Please contact us before stopping any of your heart medications.  Call the clinic at [phone] with questions or to reschedule future appointments.

## 2020-01-13 NOTE — Progress Notes (Signed)
Paramedicine Encounter   Patient ID: Robert [name] , male,   DOB: 07-13-1980,39 y.o.,  MRN: [id number]  Robert [name] was seen at the HF clinic today with Audry Riles, PharmD. He reported feeling generally well but had not taken any of his medications this morning. Additionally he had missed three evenings of medications last week. Due to his elevated creatinine on Monday, Lauren advised to hold furosemide today and then decrease to 20 mg daily. This change was made to his pillbox however he did not have Entresto or Zoloft with him so I will need to see him later this afternoon to finish filling his box. I relayed this information and he was understanding and agreeable.   [name], EMT 01/13/2020   ACTION: Next visit planned for this afternoon

## 2020-01-13 NOTE — Progress Notes (Signed)
I saw Mr Tomaso at home this afternoon following his clinic appointment to make some changes based off his lab values. Per Audry Riles, PharmD, [name] was held for two days and then he will restart at 49/51 mg. Lasix was also removed from his pillbox and he was encouraged to increase his (non-alcoholic) fluid intake. Nothing further was necessary at this time. I will follow up next week.   [name], EMT

## 2020-01-14 ENCOUNTER — Telehealth (HOSPITAL_COMMUNITY): Payer: Self-pay | Admitting: Licensed Clinical Social Worker

## 2020-01-14 NOTE — Telephone Encounter (Signed)
CSW called pt to remind of appt with the [facility] this morning at 10am.  Pt reports he is still planning on going and no barriers to attending.  Will continue to follow and assist as needed  Robert [name], [facility] Desk#: [phone] Cell#: [phone]

## 2020-01-18 ENCOUNTER — Ambulatory Visit (HOSPITAL_COMMUNITY)
Admission: RE | Admit: 2020-01-18 | Discharge: 2020-01-18 | Disposition: A | Discharge status: Home / Self Care | Payer: BC Managed Care – PPO | Source: Ambulatory Visit | Attending: Internal Medicine | Admitting: Internal Medicine

## 2020-01-18 ENCOUNTER — Other Ambulatory Visit: Payer: Self-pay

## 2020-01-18 ENCOUNTER — Other Ambulatory Visit (HOSPITAL_COMMUNITY): Payer: Self-pay

## 2020-01-18 DIAGNOSIS — I5022 Chronic systolic (congestive) heart failure: Secondary | ICD-10-CM | POA: Diagnosis not present

## 2020-01-18 LAB — BASIC METABOLIC PANEL
Anion gap: 12 (ref 5–15)
BUN: 30 mg/dL — ABNORMAL HIGH (ref 6–20)
CO2: 22 mmol/L (ref 22–32)
Calcium: 9.3 mg/dL (ref 8.9–10.3)
Chloride: 102 mmol/L (ref 98–111)
Creatinine, Ser: 1.58 mg/dL — ABNORMAL HIGH (ref 0.61–1.24)
GFR calc Af Amer: >60 mL/min (ref >60)
GFR calc non Af Amer: 55 mL/min — ABNORMAL LOW (ref >60)
Glucose, Bld: 97 mg/dL (ref 70–99)
Potassium: 4.3 mmol/L (ref 3.5–5.1)
Sodium: 136 mmol/L (ref 135–145)

## 2020-01-18 NOTE — Progress Notes (Signed)
Orders Placed This Encounter  Procedures  . Basic Metabolic Panel (BMET)    Standing Status:   Future    Standing Expiration Date:   01/17/2021    Order Specific Question:   Release to patient    Answer:   Immediate

## 2020-01-21 ENCOUNTER — Other Ambulatory Visit (HOSPITAL_COMMUNITY): Payer: Self-pay

## 2020-01-21 ENCOUNTER — Encounter (HOSPITAL_COMMUNITY): Payer: Self-pay | Admitting: *Deleted

## 2020-01-21 NOTE — Progress Notes (Signed)
Letter faxed to [phone]

## 2020-01-21 NOTE — Progress Notes (Signed)
Paramedicine Encounter    Patient ID: Robert [name], male    DOB: Sep 01, 1980, 39 y.o.   MRN: [id number]   Patient Care Team: Patient, No Pcp Per as PCP - General ([place]) Robert Latch, MD as PCP - Cardiology (Cardiology)  Patient Active Problem List   Diagnosis Date Noted  . AKI (acute kidney injury) ([place]) 10/30/2019  . Gastritis 10/30/2019  . Elevated INR 10/30/2019  . 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[place])   . Acute heart failure ([place]) 10/05/2019  . Pulmonary embolism ([place]) 10/04/2019  . Acute pulmonary embolism ([place]) 10/04/2019  . Chronic systolic CHF (congestive heart failure) ([place])   . Inappropriate sinus tachycardia   . Chest pain 11/03/2013  . Tobacco abuse 11/03/2013    Current Outpatient Medications:  .  dapagliflozin propanediol (FARXIGA) 10 MG TABS tablet, Take 1 tablet (10 mg total) by mouth daily before breakfast., Disp: 30 tablet, Rfl: 6 .  digoxin (LANOXIN) 0.125 MG tablet, Take 1 tablet (0.125 mg total) by mouth daily., Disp: 30 tablet, Rfl: 5 .  folic acid (FOLVITE) 1 MG tablet, Take 1 tablet (1 mg total) by mouth daily., Disp: 30 tablet, Rfl: 3 .  ivabradine (CORLANOR) 5 MG TABS tablet, Take 1 tablet (5 mg total) by mouth 2 (two) times daily with a meal., Disp: 60 tablet, Rfl: 5 .  Magnesium Oxide (MAG-200) 200 MG TABS, Take 200 mg by mouth 2 (two) times daily., Disp: , Rfl:  .  rivaroxaban (XARELTO) 20 MG TABS tablet, Take 1 tablet (20 mg total) by mouth daily., Disp: 30 tablet, Rfl: 0 .  sacubitril-valsartan (ENTRESTO) 49-51 MG, Take 1 tablet by mouth 2 (two) times daily., Disp: 60 tablet, Rfl: 11 .  sertraline (ZOLOFT) 25 MG tablet, Take 1 tablet (25 mg total) by mouth daily., Disp: 30 tablet, Rfl: 2 .  spironolactone (ALDACTONE) 25 MG tablet, Take 1 tablet (25 mg total) by mouth daily., Disp: 90 tablet, Rfl: 3 .  thiamine 100 MG tablet, Take 1 tablet (100 mg total) by mouth daily., Disp: 30 tablet, Rfl: 0 .   chlordiazePOXIDE (LIBRIUM) 25 MG capsule, 50mg  PO TID x 2D, then 25-50mg  PO BID X 2D, then 25-50mg  PO QD X 1D (Patient not taking: Reported on 01/21/2020), Disp: 20 capsule, Rfl: 0 .  famotidine (PEPCID) 20 MG tablet, Take 1 tablet (20 mg total) by mouth 2 (two) times daily. (Patient not taking: Reported on 01/21/2020), Disp: 10 tablet, Rfl: 0 .  furosemide (LASIX) 40 MG tablet, Take 1 tablet (40 mg total) by mouth daily as needed for fluid or edema. (Patient not taking: Reported on 01/21/2020), Disp: 15 tablet, Rfl: 6 .  ondansetron (ZOFRAN) 4 MG tablet, Take 1 tablet (4 mg total) by mouth every 8 (eight) hours as needed for nausea or vomiting. (Patient not taking: Reported on 01/21/2020), Disp: 30 tablet, Rfl: 0 Allergies  Allergen Reactions  . Bactrim [Sulfamethoxazole-Trimethoprim] Rash      Social History   Socioeconomic History  . Marital status: Single    Spouse name: Not on file  . Number of children: Not on file  . Years of education: Not on file  . Highest education level: Not on file  Occupational History  . Not on file  Tobacco Use  . Smoking status: Current Every Day Smoker    Packs/day: 0.50    Types: Cigarettes  . Smokeless tobacco: Never Used  Vaping Use  . Vaping Use: Former  Substance and Sexual Activity  .  Alcohol use: Yes  . Drug use: Yes    Types: Marijuana  . Sexual activity: Not on file  Other Topics Concern  . Not on file  Social History Narrative  . Not on file   Social Determinants of Health   Financial Resource Strain: High Risk  . Difficulty of Paying Living Expenses: Very hard  Food Insecurity: Food Insecurity Present  . Worried About Charity fundraiser in the Last Year: Sometimes true  . Ran Out of Food in the Last Year: Sometimes true  Transportation Needs:   . Lack of Transportation (Medical): Not on file  . Lack of Transportation (Non-Medical): Not on file  Physical Activity:   . Days of Exercise per Week: Not on file  . Minutes of  Exercise per Session: Not on file  Stress:   . Feeling of Stress : Not on file  Social Connections:   . Frequency of Communication with Friends and Family: Not on file  . Frequency of Social Gatherings with Friends and Family: Not on file  . Attends Religious Services: Not on file  . Active Member of Clubs or Organizations: Not on file  . Attends Archivist Meetings: Not on file  . Marital Status: Not on file  Intimate Partner Violence:   . Fear of Current or Ex-Partner: Not on file  . Emotionally Abused: Not on file  . Physically Abused: Not on file  . Sexually Abused: Not on file    Physical Exam Cardiovascular:     Rate and Rhythm: Regular rhythm. Tachycardia present.     Pulses: Normal pulses.  Pulmonary:     Effort: Pulmonary effort is normal.     Breath sounds: Normal breath sounds.  Abdominal:     General: There is no distension.  Musculoskeletal:        General: Normal range of motion.     Right lower leg: No edema.     Left lower leg: No edema.  Skin:    General: Skin is warm and dry.     Capillary Refill: Capillary refill takes less than 2 seconds.  Neurological:     Mental Status: He is alert and oriented to person, place, and time.  Psychiatric:        Mood and Affect: Mood normal.         Future Appointments  Date Time Provider [place]  01/27/2020 10:00 AM [facility] PHARMACY [facility] None  02/08/2020  2:00 PM Libby Maw, MD [facility]  03/10/2020 10:00 AM [facility] ECHO OP 1 [facility]  03/10/2020 11:00 AM Bensimhon, Shaune Pascal, MD [facility] None    BP (!) 138/94 (BP Location: Left Arm, Patient Position: Sitting, Cuff Size: Normal)   Pulse (!) 104   Resp 16   Wt 192 lb (87.1 kg)   SpO2 98%   BMI 27.55 kg/m   Weight yesterday- Did not weigh Last visit weight- 189 lb  Robert [name] was seen at home today and reported feeling well. He denied chest pain, dizziness, orthopnea, dizziness, fever or cough but complained of  intermittent headaches over the past several weeks and exertional dyspnea. He has not been compliant with his medications over the past week having missed several evening doses of medications. His weight is elevated slightly however it is still within acceptable limits. His HR and blood pressure were also elevated. We spent time working on getting him set up with [facility] and he is awaiting to hear back to see if  he will be able to be admitted on Monday for rehabilitation services. We also called his STD company to extend his time off work beyond 9/23. The company advised they would need a letter from his doctor stating why he should remain out of work so I will have the HF clinic handle this. I will make Tammy Sours aware of the [facility] intake and ask that she check in at the beginning of the week. [name], EMT 01/21/20  ACTION: Home visit completed Next visit planned for 2 weeks

## 2020-01-24 ENCOUNTER — Telehealth (HOSPITAL_COMMUNITY): Payer: Self-pay | Admitting: Licensed Clinical Social Worker

## 2020-01-24 NOTE — Telephone Encounter (Signed)
CSW informed by [employer] that pt was awaiting return call from [facility] inpatient rehab program in regards to an intake appt and requested CSW reach out to pt to check in.  CSW spoke with pt who confirms he has still not heard anything regarding an intake appt from them and that he needs to call to check in with them.  CSW offered to assist pt in calling but he states he will follow up at this time.  CSW will plan to check in with pt tomorrow to see if he was able to appt  Will continue to follow and assist as needed  Jorge Ny, [place] Worker [facility] Desk#: [phone] Cell#: [phone]

## 2020-01-27 ENCOUNTER — Inpatient Hospital Stay (HOSPITAL_COMMUNITY): Admission: RE | Admit: 2020-01-27 | Payer: BC Managed Care – PPO | Source: Ambulatory Visit

## 2020-01-28 ENCOUNTER — Telehealth (HOSPITAL_COMMUNITY): Payer: Self-pay | Admitting: Licensed Clinical Social Worker

## 2020-01-28 NOTE — Telephone Encounter (Signed)
CSW attempted to call pt to confirm if he set up intake appt with [facility]- phone went straight to VM but unable to leave message  Will attempt to check in next week  Jorge Ny, [facility] Desk#: [phone] Cell#: [phone]

## 2020-02-08 ENCOUNTER — Ambulatory Visit: Payer: BC Managed Care – PPO | Admitting: Family Medicine

## 2020-02-14 ENCOUNTER — Encounter (HOSPITAL_COMMUNITY): Payer: Self-pay | Admitting: *Deleted

## 2020-02-14 NOTE — Progress Notes (Signed)
Disability forms completed and signed by Dr Haroldine Laws and faxed to [place] at [phone]

## 2020-02-24 ENCOUNTER — Telehealth (HOSPITAL_COMMUNITY): Payer: Self-pay | Admitting: Pharmacy Technician

## 2020-02-24 ENCOUNTER — Other Ambulatory Visit (HOSPITAL_COMMUNITY): Payer: Self-pay

## 2020-02-24 NOTE — Telephone Encounter (Signed)
Advanced Heart Failure Patient Advocate Encounter  Prior Authorization for Corlanor has been approved.    PA# [id number] Effective dates: 02/24/20 through 02/23/21  Will call patients pharmacy for co-pay amount.

## 2020-02-24 NOTE — Progress Notes (Signed)
Paramedicine Encounter    Patient ID: Robert [name], male    DOB: 17-Aug-1980, 39 y.o.   MRN: [id number]   Patient Care Team: Patient, No Pcp Per as PCP - General ([place]) Skeet Latch, MD as PCP - Cardiology (Cardiology)  Patient Active Problem List   Diagnosis Date Noted  . AKI (acute kidney injury) ([place]) 10/30/2019  . Gastritis 10/30/2019  . Elevated INR 10/30/2019  . 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[place])   . Acute heart failure ([place]) 10/05/2019  . Pulmonary embolism ([place]) 10/04/2019  . Acute pulmonary embolism ([place]) 10/04/2019  . Chronic systolic CHF (congestive heart failure) ([place])   . Inappropriate sinus tachycardia   . Chest pain 11/03/2013  . Tobacco abuse 11/03/2013    Current Outpatient Medications:  .  dapagliflozin propanediol (FARXIGA) 10 MG TABS tablet, Take 1 tablet (10 mg total) by mouth daily before breakfast., Disp: 30 tablet, Rfl: 6 .  digoxin (LANOXIN) 0.125 MG tablet, Take 1 tablet (0.125 mg total) by mouth daily., Disp: 30 tablet, Rfl: 5 .  famotidine (PEPCID) 20 MG tablet, Take 1 tablet (20 mg total) by mouth 2 (two) times daily., Disp: 10 tablet, Rfl: 0 .  folic acid (FOLVITE) 1 MG tablet, Take 1 tablet (1 mg total) by mouth daily., Disp: 30 tablet, Rfl: 3 .  ivabradine (CORLANOR) 5 MG TABS tablet, Take 1 tablet (5 mg total) by mouth 2 (two) times daily with a meal., Disp: 60 tablet, Rfl: 5 .  Magnesium Oxide (MAG-200) 200 MG TABS, Take 200 mg by mouth 2 (two) times daily., Disp: , Rfl:  .  rivaroxaban (XARELTO) 20 MG TABS tablet, Take 1 tablet (20 mg total) by mouth daily., Disp: 30 tablet, Rfl: 0 .  sacubitril-valsartan (ENTRESTO) 49-51 MG, Take 1 tablet by mouth 2 (two) times daily., Disp: 60 tablet, Rfl: 11 .  sertraline (ZOLOFT) 25 MG tablet, Take 1 tablet (25 mg total) by mouth daily., Disp: 30 tablet, Rfl: 2 .  spironolactone (ALDACTONE) 25 MG tablet, Take 1 tablet (25 mg total) by mouth daily., Disp: 90  tablet, Rfl: 3 .  thiamine 100 MG tablet, Take 1 tablet (100 mg total) by mouth daily., Disp: 30 tablet, Rfl: 0 .  chlordiazePOXIDE (LIBRIUM) 25 MG capsule, 50mg  PO TID x 2D, then 25-50mg  PO BID X 2D, then 25-50mg  PO QD X 1D (Patient not taking: Reported on 02/24/2020), Disp: 20 capsule, Rfl: 0 .  furosemide (LASIX) 40 MG tablet, Take 1 tablet (40 mg total) by mouth daily as needed for fluid or edema. (Patient not taking: Reported on 01/21/2020), Disp: 15 tablet, Rfl: 6 .  ondansetron (ZOFRAN) 4 MG tablet, Take 1 tablet (4 mg total) by mouth every 8 (eight) hours as needed for nausea or vomiting. (Patient not taking: Reported on 01/21/2020), Disp: 30 tablet, Rfl: 0 Allergies  Allergen Reactions  . Bactrim [Sulfamethoxazole-Trimethoprim] Rash      Social History   Socioeconomic History  . Marital status: Single    Spouse name: Not on file  . Number of children: Not on file  . Years of education: Not on file  . Highest education level: Not on file  Occupational History  . Not on file  Tobacco Use  . Smoking status: Current Every Day Smoker    Packs/day: 0.50    Types: Cigarettes  . Smokeless tobacco: Never Used  Vaping Use  . Vaping Use: Former  Substance and Sexual Activity  . Alcohol use: Yes  . Drug  use: Yes    Types: Marijuana  . Sexual activity: Not on file  Other Topics Concern  . Not on file  Social History Narrative  . Not on file   Social Determinants of Health   Financial Resource Strain: High Risk  . Difficulty of Paying Living Expenses: Very hard  Food Insecurity: Food Insecurity Present  . Worried About Charity fundraiser in the Last Year: Sometimes true  . Ran Out of Food in the Last Year: Sometimes true  Transportation Needs:   . Lack of Transportation (Medical): Not on file  . Lack of Transportation (Non-Medical): Not on file  Physical Activity:   . Days of Exercise per Week: Not on file  . Minutes of Exercise per Session: Not on file  Stress:   .  Feeling of Stress : Not on file  Social Connections:   . Frequency of Communication with Friends and Family: Not on file  . Frequency of Social Gatherings with Friends and Family: Not on file  . Attends Religious Services: Not on file  . Active Member of Clubs or Organizations: Not on file  . Attends Archivist Meetings: Not on file  . Marital Status: Not on file  Intimate Partner Violence:   . Fear of Current or Ex-Partner: Not on file  . Emotionally Abused: Not on file  . Physically Abused: Not on file  . Sexually Abused: Not on file    Physical Exam Cardiovascular:     Rate and Rhythm: Normal rate and regular rhythm.     Pulses: Normal pulses.  Pulmonary:     Effort: Pulmonary effort is normal.     Breath sounds: Normal breath sounds.  Musculoskeletal:        General: Normal range of motion.     Right lower leg: No edema.     Left lower leg: No edema.  Skin:    General: Skin is warm and dry.     Capillary Refill: Capillary refill takes less than 2 seconds.  Neurological:     Mental Status: He is alert and oriented to person, place, and time.  Psychiatric:        Mood and Affect: Mood normal.         Future Appointments  Date Time Provider [place]  02/28/2020  8:00 AM Libby Maw, MD [facility]  03/10/2020 10:00 AM [facility] ECHO OP 1 [facility]  03/10/2020 11:00 AM Bensimhon, Shaune Pascal, MD [facility] None    BP 113/75 (BP Location: Right Arm, Patient Position: Sitting, Cuff Size: Normal)   Pulse 67   Resp 16   Wt 200 lb (90.7 kg)   SpO2 100%   BMI 28.70 kg/m   Weight yesterday- n/a Last visit weight- 192 lb  Robert [name] was seen at home today and reported feeling generally well. He denied chest pain, SOB, headache, dizziness, orthopnea, fever or cough. For the past month he has been in [place] for alcohol rehab and was discharged yesterday. While there his medications were managed for him so her reported being compliant. He  stated he has a sobriety plan which includes meetings and support systems which was set up before he left rehab but he did not elaborate. His mediations were verified and his pillbox was refilled. He will need to add [name] and Wilder Glade to his pillbox when he picks it up tomorrow. He was left written instructions on where to place these medications and he was understanding. I will follow  up next week.   [name], EMT 02/24/20  ACTION: Home visit completed Next visit planned for 1 week

## 2020-02-24 NOTE — Telephone Encounter (Signed)
Patient Advocate Encounter   Received notification from [place] that prior authorization for Corlanor is required.   PA submitted on CoverMyMeds Key [id number] Status is pending   Will continue to follow.

## 2020-02-25 ENCOUNTER — Telehealth (HOSPITAL_COMMUNITY): Payer: Self-pay | Admitting: Licensed Clinical Social Worker

## 2020-02-25 NOTE — Telephone Encounter (Signed)
CSW called pt to check in after he finished rehab at [facility].  Pt reports it went well and he is feeling better- reports that they helped him set up an outpatient sobriety plan and he feels good about this as well.  CSW reminded pt of new PCP appt for this Monday at 8am- pt acknowledges and states he is aware of where they are and how to contact them.    CSW encouraged pt to reach out if he needs any assistance from [place].  Will continue to follow and assist as needed  Jorge Ny, [facility] Desk#: [phone] Cell#: [phone]

## 2020-02-28 ENCOUNTER — Ambulatory Visit: Payer: BC Managed Care – PPO | Admitting: Family Medicine

## 2020-03-02 ENCOUNTER — Telehealth (HOSPITAL_COMMUNITY): Payer: Self-pay

## 2020-03-02 ENCOUNTER — Telehealth (HOSPITAL_COMMUNITY): Payer: Self-pay | Admitting: Licensed Clinical Social Worker

## 2020-03-02 NOTE — Telephone Encounter (Signed)
Tried calling Mr Moroney again but he did not answer. There was not a voicemail set up so I was not able to leave a message. I will reach out to Tammy Sours, LCSW, to see if she can reach out as well. I will try again tomorrow.  [name], EMT 03/02/20

## 2020-03-02 NOTE — Telephone Encounter (Signed)
I called Mr Cordner to see if he was available for an appointment. He did not answer and his voicemail was not set up to leave a message. I will try again later in the day.   [name], EMT 03/02/20

## 2020-03-02 NOTE — Telephone Encounter (Signed)
CSW attempted to call pt to check in as paramedic has been unable to reach.  No answer and unable to leave a voicemail  Jorge Ny, [facility] Desk#: [phone] Cell#: [phone]

## 2020-03-03 ENCOUNTER — Telehealth (HOSPITAL_COMMUNITY): Payer: Self-pay

## 2020-03-03 NOTE — Telephone Encounter (Signed)
I called Mr Bagdasarian to attempt to schedule an appointment. He did not answer and his voicemail was not set up so I was unable to leave a message. I will try again next week.   [name], EMT 03/03/20

## 2020-03-06 ENCOUNTER — Inpatient Hospital Stay (HOSPITAL_COMMUNITY)
Admission: EM | Admit: 2020-03-06 | Discharge: 2020-03-08 | DRG: 897 | Disposition: A | Discharge status: Home / Self Care | Payer: BC Managed Care – PPO | Attending: Internal Medicine | Admitting: Internal Medicine

## 2020-03-06 ENCOUNTER — Emergency Department (HOSPITAL_COMMUNITY): Payer: BC Managed Care – PPO

## 2020-03-06 ENCOUNTER — Encounter (HOSPITAL_COMMUNITY): Payer: Self-pay | Admitting: Emergency Medicine

## 2020-03-06 ENCOUNTER — Other Ambulatory Visit: Payer: Self-pay

## 2020-03-06 DIAGNOSIS — F10239 Alcohol dependence with withdrawal, unspecified: Secondary | ICD-10-CM | POA: Diagnosis present

## 2020-03-06 DIAGNOSIS — F10232 Alcohol dependence with withdrawal with perceptual disturbance: Principal | ICD-10-CM | POA: Diagnosis present

## 2020-03-06 DIAGNOSIS — N179 Acute kidney failure, unspecified: Secondary | ICD-10-CM

## 2020-03-06 DIAGNOSIS — Z833 Family history of diabetes mellitus: Secondary | ICD-10-CM | POA: Diagnosis not present

## 2020-03-06 DIAGNOSIS — Z7901 Long term (current) use of anticoagulants: Secondary | ICD-10-CM

## 2020-03-06 DIAGNOSIS — F10230 Alcohol dependence with withdrawal, uncomplicated: Secondary | ICD-10-CM | POA: Diagnosis not present

## 2020-03-06 DIAGNOSIS — R112 Nausea with vomiting, unspecified: Secondary | ICD-10-CM | POA: Diagnosis not present

## 2020-03-06 DIAGNOSIS — I5022 Chronic systolic (congestive) heart failure: Secondary | ICD-10-CM | POA: Diagnosis present

## 2020-03-06 DIAGNOSIS — K920 Hematemesis: Secondary | ICD-10-CM | POA: Diagnosis present

## 2020-03-06 DIAGNOSIS — Z881 Allergy status to other antibiotic agents status: Secondary | ICD-10-CM

## 2020-03-06 DIAGNOSIS — I426 Alcoholic cardiomyopathy: Secondary | ICD-10-CM | POA: Diagnosis present

## 2020-03-06 DIAGNOSIS — Z8249 Family history of ischemic heart disease and other diseases of the circulatory system: Secondary | ICD-10-CM

## 2020-03-06 DIAGNOSIS — Z86711 Personal history of pulmonary embolism: Secondary | ICD-10-CM

## 2020-03-06 DIAGNOSIS — Z841 Family history of disorders of kidney and ureter: Secondary | ICD-10-CM | POA: Diagnosis not present

## 2020-03-06 DIAGNOSIS — R000 Tachycardia, unspecified: Secondary | ICD-10-CM | POA: Diagnosis present

## 2020-03-06 DIAGNOSIS — I4711 Inappropriate sinus tachycardia, so stated: Secondary | ICD-10-CM | POA: Diagnosis present

## 2020-03-06 DIAGNOSIS — I130 Hypertensive heart and chronic kidney disease with heart failure and stage 1 through stage 4 chronic kidney disease, or unspecified chronic kidney disease: Secondary | ICD-10-CM | POA: Diagnosis present

## 2020-03-06 DIAGNOSIS — F10939 Alcohol use, unspecified with withdrawal, unspecified: Secondary | ICD-10-CM

## 2020-03-06 DIAGNOSIS — F32A Depression, unspecified: Secondary | ICD-10-CM | POA: Diagnosis present

## 2020-03-06 DIAGNOSIS — F17210 Nicotine dependence, cigarettes, uncomplicated: Secondary | ICD-10-CM | POA: Diagnosis present

## 2020-03-06 DIAGNOSIS — R079 Chest pain, unspecified: Secondary | ICD-10-CM | POA: Diagnosis not present

## 2020-03-06 DIAGNOSIS — R197 Diarrhea, unspecified: Secondary | ICD-10-CM | POA: Diagnosis present

## 2020-03-06 DIAGNOSIS — K7010 Alcoholic hepatitis without ascites: Secondary | ICD-10-CM | POA: Diagnosis present

## 2020-03-06 DIAGNOSIS — Z20822 Contact with and (suspected) exposure to covid-19: Secondary | ICD-10-CM | POA: Diagnosis present

## 2020-03-06 DIAGNOSIS — Z79899 Other long term (current) drug therapy: Secondary | ICD-10-CM | POA: Diagnosis not present

## 2020-03-06 DIAGNOSIS — I160 Hypertensive urgency: Secondary | ICD-10-CM | POA: Diagnosis present

## 2020-03-06 DIAGNOSIS — F10932 Alcohol use, unspecified with withdrawal with perceptual disturbance: Secondary | ICD-10-CM

## 2020-03-06 DIAGNOSIS — K2920 Alcoholic gastritis without bleeding: Secondary | ICD-10-CM | POA: Diagnosis present

## 2020-03-06 DIAGNOSIS — N184 Chronic kidney disease, stage 4 (severe): Secondary | ICD-10-CM | POA: Diagnosis present

## 2020-03-06 HISTORY — DX: Alcohol dependence with withdrawal, unspecified: F10.239

## 2020-03-06 HISTORY — DX: Alcohol use, unspecified with withdrawal, unspecified: F10.939

## 2020-03-06 LAB — CBC
HCT: 34.9 % — ABNORMAL LOW (ref 39.0–52.0)
HCT: 35.2 % — ABNORMAL LOW (ref 39.0–52.0)
HCT: 37.8 % — ABNORMAL LOW (ref 39.0–52.0)
Hemoglobin: 11.9 g/dL — ABNORMAL LOW (ref 13.0–17.0)
Hemoglobin: 12.1 g/dL — ABNORMAL LOW (ref 13.0–17.0)
Hemoglobin: 12.3 g/dL — ABNORMAL LOW (ref 13.0–17.0)
MCH: 30.1 pg (ref 26.0–34.0)
MCH: 30 pg (ref 26.0–34.0)
MCH: 29 pg (ref 26.0–34.0)
MCHC: 34.1 g/dL (ref 30.0–36.0)
MCHC: 34.4 g/dL (ref 30.0–36.0)
MCHC: 32.5 g/dL (ref 30.0–36.0)
MCV: 88.1 fL (ref 80.0–100.0)
MCV: 87.3 fL (ref 80.0–100.0)
MCV: 89.2 fL (ref 80.0–100.0)
Platelets: 159 10*3/uL (ref 150–400)
Platelets: 159 10*3/uL (ref 150–400)
Platelets: 174 10*3/uL (ref 150–400)
RBC: 3.96 MIL/uL — ABNORMAL LOW (ref 4.22–5.81)
RBC: 4.03 MIL/uL — ABNORMAL LOW (ref 4.22–5.81)
RBC: 4.24 MIL/uL (ref 4.22–5.81)
RDW: 12.9 % (ref 11.5–15.5)
RDW: 12.8 % (ref 11.5–15.5)
RDW: 12.8 % (ref 11.5–15.5)
WBC: 8.4 10*3/uL (ref 4.0–10.5)
WBC: 7.6 10*3/uL (ref 4.0–10.5)
WBC: 6.7 10*3/uL (ref 4.0–10.5)
nRBC: 0 % (ref 0.0–0.2)
nRBC: 0 % (ref 0.0–0.2)
nRBC: 0 % (ref 0.0–0.2)

## 2020-03-06 LAB — TYPE AND SCREEN
ABO/RH(D): O POS
Antibody Screen: NEGATIVE

## 2020-03-06 LAB — CBC WITH DIFFERENTIAL/PLATELET
Abs Immature Granulocytes: 0.02 10*3/uL (ref 0.00–0.07)
Basophils Absolute: 0.1 10*3/uL (ref 0.0–0.1)
Basophils Relative: 1 %
Eosinophils Absolute: 0 10*3/uL (ref 0.0–0.5)
Eosinophils Relative: 0 %
HCT: 38.1 % — ABNORMAL LOW (ref 39.0–52.0)
Hemoglobin: 12.7 g/dL — ABNORMAL LOW (ref 13.0–17.0)
Immature Granulocytes: 0 %
Lymphocytes Relative: 9 %
Lymphs Abs: 0.7 10*3/uL (ref 0.7–4.0)
MCH: 29.9 pg (ref 26.0–34.0)
MCHC: 33.3 g/dL (ref 30.0–36.0)
MCV: 89.6 fL (ref 80.0–100.0)
Monocytes Absolute: 0.4 10*3/uL (ref 0.1–1.0)
Monocytes Relative: 6 %
Neutro Abs: 6.6 10*3/uL (ref 1.7–7.7)
Neutrophils Relative %: 84 %
Platelets: 185 10*3/uL (ref 150–400)
RBC: 4.25 MIL/uL (ref 4.22–5.81)
RDW: 12.8 % (ref 11.5–15.5)
WBC: 7.9 10*3/uL (ref 4.0–10.5)
nRBC: 0 % (ref 0.0–0.2)

## 2020-03-06 LAB — GLUCOSE, CAPILLARY
Glucose-Capillary: 105 mg/dL — ABNORMAL HIGH (ref 70–99)
Glucose-Capillary: 143 mg/dL — ABNORMAL HIGH (ref 70–99)
Glucose-Capillary: 91 mg/dL (ref 70–99)

## 2020-03-06 LAB — COMPREHENSIVE METABOLIC PANEL
ALT: 52 U/L — ABNORMAL HIGH (ref 0–44)
AST: 45 U/L — ABNORMAL HIGH (ref 15–41)
Albumin: 4.8 g/dL (ref 3.5–5.0)
Alkaline Phosphatase: 51 U/L (ref 38–126)
Anion gap: 21 — ABNORMAL HIGH (ref 5–15)
BUN: 27 mg/dL — ABNORMAL HIGH (ref 6–20)
CO2: 20 mmol/L — ABNORMAL LOW (ref 22–32)
Calcium: 9.1 mg/dL (ref 8.9–10.3)
Chloride: 95 mmol/L — ABNORMAL LOW (ref 98–111)
Creatinine, Ser: 2.18 mg/dL — ABNORMAL HIGH (ref 0.61–1.24)
GFR, Estimated: 39 mL/min — ABNORMAL LOW (ref >60)
Glucose, Bld: 175 mg/dL — ABNORMAL HIGH (ref 70–99)
Potassium: 3.9 mmol/L (ref 3.5–5.1)
Sodium: 136 mmol/L (ref 135–145)
Total Bilirubin: 1.8 mg/dL — ABNORMAL HIGH (ref 0.3–1.2)
Total Protein: 8.3 g/dL — ABNORMAL HIGH (ref 6.5–8.1)

## 2020-03-06 LAB — CBG MONITORING, ED: Glucose-Capillary: 172 mg/dL — ABNORMAL HIGH (ref 70–99)

## 2020-03-06 LAB — TROPONIN I (HIGH SENSITIVITY)
Troponin I (High Sensitivity): 16 ng/L (ref <18)
Troponin I (High Sensitivity): 14 ng/L (ref <18)

## 2020-03-06 LAB — DIGOXIN LEVEL: Digoxin Level: 0.4 ng/mL — ABNORMAL LOW (ref 1.0–2.0)

## 2020-03-06 LAB — PROTIME-INR
INR: 1.8 — ABNORMAL HIGH (ref 0.8–1.2)
Prothrombin Time: 19.9 seconds — ABNORMAL HIGH (ref 11.4–15.2)

## 2020-03-06 LAB — RESPIRATORY PANEL BY RT PCR (FLU A&B, COVID)
Influenza A by PCR: NEGATIVE
Influenza B by PCR: NEGATIVE
SARS Coronavirus 2 by RT PCR: NEGATIVE

## 2020-03-06 LAB — LIPASE, BLOOD: Lipase: 36 U/L (ref 11–51)

## 2020-03-06 MED ORDER — CLONAZEPAM 1 MG PO TABS
1.0000 mg | ORAL_TABLET | Freq: Two times a day (BID) | ORAL | Status: DC
  Administered 2020-03-06 – 2020-03-07 (×2): 1 mg via ORAL
  Filled 2020-03-06 (×2): qty 1

## 2020-03-06 MED ORDER — HYDRALAZINE HCL 20 MG/ML IJ SOLN: 10.0000 mg | INTRAMUSCULAR | Status: DC | PRN

## 2020-03-06 MED ORDER — LORAZEPAM 2 MG/ML IJ SOLN: 0.0000 mg | Freq: Four times a day (QID) | INTRAMUSCULAR | Status: AC

## 2020-03-06 MED ORDER — RIVAROXABAN 20 MG PO TABS
20.0000 mg | ORAL_TABLET | Freq: Every day | ORAL | Status: DC
  Administered 2020-03-06 – 2020-03-08 (×3): 20 mg via ORAL
  Filled 2020-03-06 (×3): qty 1

## 2020-03-06 MED ORDER — PANTOPRAZOLE SODIUM 40 MG IV SOLR
40.0000 mg | Freq: Two times a day (BID) | INTRAVENOUS | Status: DC
  Administered 2020-03-06: 40 mg via INTRAVENOUS
  Filled 2020-03-06: qty 40

## 2020-03-06 MED ORDER — LORAZEPAM 2 MG/ML IJ SOLN
1.0000 mg | Freq: Once | INTRAMUSCULAR | Status: AC
  Administered 2020-03-06: 1 mg via INTRAVENOUS
  Filled 2020-03-06: qty 1

## 2020-03-06 MED ORDER — DIGOXIN 125 MCG PO TABS
0.1250 mg | ORAL_TABLET | Freq: Every day | ORAL | Status: DC
  Administered 2020-03-06 – 2020-03-08 (×3): 0.125 mg via ORAL
  Filled 2020-03-06 (×3): qty 1

## 2020-03-06 MED ORDER — FOLIC ACID 1 MG PO TABS
1.0000 mg | ORAL_TABLET | Freq: Every day | ORAL | Status: DC
  Administered 2020-03-06 – 2020-03-08 (×3): 1 mg via ORAL
  Filled 2020-03-06 (×3): qty 1

## 2020-03-06 MED ORDER — SERTRALINE HCL 25 MG PO TABS
25.0000 mg | ORAL_TABLET | Freq: Every day | ORAL | Status: DC
  Administered 2020-03-06 – 2020-03-08 (×3): 25 mg via ORAL
  Filled 2020-03-06 (×3): qty 1

## 2020-03-06 MED ORDER — THIAMINE HCL 100 MG PO TABS
100.0000 mg | ORAL_TABLET | Freq: Every day | ORAL | Status: DC
  Administered 2020-03-08: 100 mg via ORAL
  Filled 2020-03-06 (×3): qty 1

## 2020-03-06 MED ORDER — LORAZEPAM 1 MG PO TABS: 1.0000 mg | ORAL_TABLET | ORAL | Status: DC | PRN

## 2020-03-06 MED ORDER — ADULT MULTIVITAMIN W/MINERALS CH
1.0000 | ORAL_TABLET | Freq: Every day | ORAL | Status: DC
  Administered 2020-03-06 – 2020-03-08 (×3): 1 via ORAL
  Filled 2020-03-06 (×3): qty 1

## 2020-03-06 MED ORDER — QUETIAPINE FUMARATE 50 MG PO TABS
150.0000 mg | ORAL_TABLET | Freq: Every day | ORAL | Status: DC
  Administered 2020-03-06 – 2020-03-08 (×3): 150 mg via ORAL
  Filled 2020-03-06 (×2): qty 1

## 2020-03-06 MED ORDER — SUCRALFATE 1 GM/10ML PO SUSP
1.0000 g | Freq: Three times a day (TID) | ORAL | Status: DC
  Administered 2020-03-06 – 2020-03-08 (×8): 1 g via ORAL
  Filled 2020-03-06 (×8): qty 10

## 2020-03-06 MED ORDER — THIAMINE HCL 100 MG/ML IJ SOLN
100.0000 mg | Freq: Every day | INTRAMUSCULAR | Status: DC
  Administered 2020-03-06 – 2020-03-07 (×2): 100 mg via INTRAVENOUS
  Filled 2020-03-06 (×2): qty 2

## 2020-03-06 MED ORDER — LORAZEPAM 2 MG/ML IJ SOLN: 0.0000 mg | Freq: Two times a day (BID) | INTRAMUSCULAR | Status: DC

## 2020-03-06 MED ORDER — LORAZEPAM 2 MG/ML IJ SOLN
1.0000 mg | INTRAMUSCULAR | Status: DC | PRN
  Administered 2020-03-06 (×2): 1 mg via INTRAVENOUS
  Filled 2020-03-06 (×2): qty 1

## 2020-03-06 NOTE — Plan of Care (Signed)
Alert and oriented times 4, no c/o pain or discomfort at this time. Large BM, noted. NPO at this time. Oriented to room and unit. Call bell in reach. Problem: Education: Goal: Knowledge of disease or condition will improve Outcome: Progressing Goal: Understanding of discharge needs will improve Outcome: Progressing   Problem: Health Behavior/Discharge Planning: Goal: Ability to identify changes in lifestyle to reduce recurrence of condition will improve Outcome: Progressing Goal: Identification of resources available to assist in meeting health care needs will improve Outcome: Progressing   Problem: Physical Regulation: Goal: Complications related to the disease process, condition or treatment will be avoided or minimized Outcome: Progressing   Problem: Safety: Goal: Ability to remain free from injury will improve Outcome: Progressing

## 2020-03-06 NOTE — Consult Note (Addendum)
Referring Provider: Dr. Gean Birchwood Primary Care Physician:  Patient, No Pcp Per Primary Gastroenterologist:  Althia Forts  Reason for Consultation:  Nausea/vomiting, hematemesis, melena  HPI: Robert [name] is a 39 y.o. male with history of with history of alcoholic cardiomyopathy, history of PE (on Xarelto), chronic kidney disease stage IV, and gastritis presenting with a chief complaint of nausea/vomiting.  Patient reports nausea and vomiting starting last night at approximately 6PM.  He had several episodes of emesis, which were initially clear but then had 2 episodes of emesis with scant bright red blood.  He then had 1-2 episodes of emesis with coffee grounds. Started to have chest discomfort after several episodes of emesis, though this has resolved.  Reports mild epigastric abdominal discomfort.  States he has had melena for 2-3 days.  Denies hematochezia.  Denies any dysphagia, GERD, unexplained weight loss.  Has been drinking 2 fifths of liquor every other day for approximately 2 weeks.  Is on Xarelto, last dose yesterday. Denies ASA or NSAID use.  11/01/19 EGD: Erythematous mucosa in the stomach. Erythematous duodenopathy.  Past Medical History:  Diagnosis Date  . Alcohol use   . Asthma   . CHF (congestive heart failure) ([place])     Past Surgical History:  Procedure Laterality Date  . ESOPHAGOGASTRODUODENOSCOPY (EGD) WITH PROPOFOL N/A 11/01/2019   Procedure: ESOPHAGOGASTRODUODENOSCOPY (EGD) WITH PROPOFOL;  Surgeon: [name], MD;  Location: [facility] ENDOSCOPY;  Service: Endoscopy;  Laterality: N/A;  . RIGHT/LEFT HEART CATH AND CORONARY ANGIOGRAPHY N/A 10/06/2019   Procedure: RIGHT/LEFT HEART CATH AND CORONARY ANGIOGRAPHY;  Surgeon: [name], MD;  Location: [facility];  Service: Cardiovascular;  Laterality: N/A;  . WRIST SURGERY      Prior to Admission medications   Medication Sig Start Date End Date Taking? Authorizing Provider  dapagliflozin  propanediol (FARXIGA) 10 MG TABS tablet Take 1 tablet (10 mg total) by mouth daily before breakfast. 12/09/19  Yes Bensimhon, Shaune Pascal, MD  digoxin (LANOXIN) 0.125 MG tablet Take 1 tablet (0.125 mg total) by mouth daily. 10/14/19  Yes Rosita Fire, Brittainy M, PA-C  famotidine (PEPCID) 20 MG tablet Take 1 tablet (20 mg total) by mouth 2 (two) times daily. 01/10/20  Yes Mesner, Corene Cornea, MD  folic acid (FOLVITE) 1 MG tablet Take 1 tablet (1 mg total) by mouth daily. 11/08/19  Yes Clegg, Amy D, NP  ivabradine (CORLANOR) 5 MG TABS tablet Take 1 tablet (5 mg total) by mouth 2 (two) times daily with a meal. 10/13/19  Yes Lyda Jester M, PA-C  Magnesium Oxide (MAG-200) 200 MG TABS Take 200 mg by mouth 2 (two) times daily.   Yes [provider]  pantoprazole (PROTONIX) 40 MG tablet Take 40 mg by mouth 2 (two) times daily. Take 1 tablet by mouth twice daily.   Yes [provider]  QUEtiapine (SEROQUEL) 100 MG tablet Take 150 mg by mouth daily. Take one and one-half tablets by mouth daily as needed.   Yes [provider]  rivaroxaban (XARELTO) 20 MG TABS tablet Take 1 tablet (20 mg total) by mouth daily. 11/03/19  Yes Thurnell Lose, MD  sacubitril-valsartan (ENTRESTO) 49-51 MG Take 1 tablet by mouth 2 (two) times daily. 01/13/20  Yes Bensimhon, Shaune Pascal, MD  sertraline (ZOLOFT) 25 MG tablet Take 1 tablet (25 mg total) by mouth daily. 11/25/19 11/24/20 Yes Clegg, Amy D, NP  spironolactone (ALDACTONE) 25 MG tablet Take 1 tablet (25 mg total) by mouth daily. 12/30/19  Yes Bensimhon, Quillian Quince  R, MD  thiamine 100 MG tablet Take 1 tablet (100 mg total) by mouth daily. 11/03/19  Yes Thurnell Lose, MD  furosemide (LASIX) 40 MG tablet Take 1 tablet (40 mg total) by mouth daily as needed for fluid or edema. Patient not taking: Reported on 01/21/2020 01/13/20   Bensimhon, Shaune Pascal, MD  ondansetron (ZOFRAN) 4 MG tablet Take 1 tablet (4 mg total) by mouth every 8 (eight) hours as needed for nausea or  vomiting. Patient not taking: Reported on 01/21/2020 01/10/20   Mesner, Corene Cornea, MD    Scheduled Meds: . folic acid  1 mg Oral Daily  . LORazepam  0-4 mg Intravenous Q6H   Followed by  . [START ON 03/08/2020] LORazepam  0-4 mg Intravenous Q12H  . multivitamin with minerals  1 tablet Oral Daily  . pantoprazole (PROTONIX) IV  40 mg Intravenous Q12H  . thiamine  100 mg Oral Daily   Or  . thiamine  100 mg Intravenous Daily   Continuous Infusions: PRN Meds:.hydrALAZINE, LORazepam **OR** LORazepam  Allergies as of 03/06/2020 - Review Complete 03/06/2020  Allergen Reaction Noted  . Bactrim [sulfamethoxazole-trimethoprim] Rash 10/30/2019    Family History  Problem Relation Age of Onset  . Heart failure Father   . Diabetes Father   . Kidney disease Father     Social History   Socioeconomic History  . Marital status: Single    Spouse name: Not on file  . Number of children: Not on file  . Years of education: Not on file  . Highest education level: Not on file  Occupational History  . Not on file  Tobacco Use  . Smoking status: Current Every Day Smoker    Packs/day: 0.50    Types: Cigarettes  . Smokeless tobacco: Never Used  Vaping Use  . Vaping Use: Former  Substance and Sexual Activity  . Alcohol use: Yes  . Drug use: Yes    Types: Marijuana  . Sexual activity: Not on file  Other Topics Concern  . Not on file  Social History Narrative  . Not on file   Social Determinants of Health   Financial Resource Strain: High Risk  . Difficulty of Paying Living Expenses: Very hard  Food Insecurity: Food Insecurity Present  . Worried About Charity fundraiser in the Last Year: Sometimes true  . Ran Out of Food in the Last Year: Sometimes true  Transportation Needs:   . Lack of Transportation (Medical): Not on file  . Lack of Transportation (Non-Medical): Not on file  Physical Activity:   . Days of Exercise per Week: Not on file  . Minutes of Exercise per Session: Not on  file  Stress:   . Feeling of Stress : Not on file  Social Connections:   . Frequency of Communication with Friends and Family: Not on file  . Frequency of Social Gatherings with Friends and Family: Not on file  . Attends Religious Services: Not on file  . Active Member of Clubs or Organizations: Not on file  . Attends Archivist Meetings: Not on file  . Marital Status: Not on file  Intimate Partner Violence:   . Fear of Current or Ex-Partner: Not on file  . Emotionally Abused: Not on file  . Physically Abused: Not on file  . Sexually Abused: Not on file    Review of Systems: Review of Systems  Constitutional: Positive for malaise/fatigue. Negative for weight loss.  HENT: Negative for hearing loss and tinnitus.  Eyes: Negative for pain and redness.  Respiratory: Negative for cough and shortness of breath.   Cardiovascular: Positive for chest pain (now resolved). Negative for palpitations.  Gastrointestinal: Positive for abdominal pain, melena, nausea and vomiting. Negative for blood in stool, constipation, diarrhea and heartburn.  Genitourinary: Negative for flank pain and hematuria.  Musculoskeletal: Negative for back pain and joint pain.  Skin: Negative for itching and rash.  Neurological: Negative for seizures and loss of consciousness.  Endo/Heme/Allergies: Negative for polydipsia. Does not bruise/bleed easily.  Psychiatric/Behavioral: Positive for substance abuse. The patient is not nervous/anxious.    .  Physical Exam: Vital signs: Vitals:   03/06/20 0753 03/06/20 0824  BP:  139/81  Pulse:  (!) 107  Resp:  18  Temp: 99.3 F (37.4 C) 98.3 F (36.8 C)  SpO2:  100%   Last BM Date: 03/06/20  Physical Exam Vitals reviewed.  Constitutional:      General: He is not in acute distress. HENT:     Head: Normocephalic and atraumatic.     Nose: Nose normal.     Mouth/Throat:     Mouth: Mucous membranes are moist.     Pharynx: Oropharynx is clear.  Eyes:      General: No scleral icterus.    Extraocular Movements: Extraocular movements intact.     Conjunctiva/sclera: Conjunctivae normal.  Cardiovascular:     Rate and Rhythm: Regular rhythm. Tachycardia present.     Pulses: Normal pulses.     Heart sounds: Normal heart sounds.  Pulmonary:     Effort: Pulmonary effort is normal. No respiratory distress.     Breath sounds: Normal breath sounds.  Abdominal:     General: Bowel sounds are normal. There is distension (mild).     Palpations: Abdomen is soft. There is no mass.     Tenderness: There is abdominal tenderness (mild, epigastric). There is no guarding or rebound.     Hernia: No hernia is present.  Musculoskeletal:        General: No swelling or tenderness.     Cervical back: Normal range of motion and neck supple.  Skin:    General: Skin is warm and dry.  Neurological:     General: No focal deficit present.     Mental Status: He is oriented to person, place, and time. He is lethargic.  Psychiatric:        Mood and Affect: Mood normal.        Behavior: Behavior normal. Behavior is cooperative.      GI:  Lab Results: Recent Labs    03/06/20 0314 03/06/20 0630  WBC 7.9 8.4  HGB 12.7* 11.9*  HCT 38.1* 34.9*  PLT 185 159   BMET Recent Labs    03/06/20 0314  NA 136  K 3.9  CL 95*  CO2 20*  GLUCOSE 175*  BUN 27*  CREATININE 2.18*  CALCIUM 9.1   LFT Recent Labs    03/06/20 0314  PROT 8.3*  ALBUMIN 4.8  AST 45*  ALT 52*  ALKPHOS 51  BILITOT 1.8*   PT/INR Recent Labs    03/06/20 0314  LABPROT 19.9*  INR 1.8*     Studies/Results: DG Chest Port 1 View  Result Date: 03/06/2020 CLINICAL DATA:  Vomiting and chest pain EXAM: PORTABLE CHEST 1 VIEW COMPARISON:  01/09/2020 FINDINGS: The heart size and mediastinal contours are within normal limits. Both lungs are clear. The visualized skeletal structures are unremarkable. IMPRESSION: No active disease. Electronically Signed   By:  Ulyses Jarred M.D.   On:  03/06/2020 03:40    Impression: Nausea/vomiting, scant red blood in emesis x2, coffee ground emesis x2. Nausea/vomiting may have been triggered by alcohol use/withdrawal. Suspect gastritis plus esophagitis vs. MWT. -Hgb stable 11.9 today as compared to 12.7 yesterday, decrease likely hemodilutional/ Baseline Hgb 11.8 as of 1 month ago.  Transaminitis: T. Bili 1.8/ AST 45/ALT 52/ ALP 51. Possibly related to alcohol use.   Alcohol withdrawal, hypertensive urgency  History of PE, on Xarelto, reports last dose as 10/24 AM  CKD stage 4  Alcoholic cardiomyopathy  Plan: Continue Protonix 40 mg IV BID.  Initiate Carafate 4 times daily.  No plans for EGD at this time, as patient is stable from a GI standpoint, currently undergoing alcohol withdrawal and has had recent EGD 10/2019.   OK from GI standpoint for patient to have a clear liquid diet.  Advance as tolerated.  Continue to monitor LFTs.  Consider RUQ [place], which may be able to be completed as an outpatient pending clinical course.  Continue to monitor H&H with transfusion as needed to maintain Hgb >7.   [facility] will follow.   LOS: 0 days   Salley Slaughter  PA-C 03/06/2020, 8:47 AM  Contact #  [phone]

## 2020-03-06 NOTE — ED Triage Notes (Signed)
Pt reports vomiting and L chest pain starting this morning. States that he was recently in [place] for about 1 month. States that he has started drinking again, but has not had any alcohol today. Reports hematemesis as well.

## 2020-03-06 NOTE — ED Notes (Signed)
Pt is tolerating liquids well. He states the nausea is still there but vomiting has improved tremendously.

## 2020-03-06 NOTE — H&P (Addendum)
History and Physical    Jaystin Mcgarvey GEX:[id number] DOB: 1980/08/09 DOA: 03/06/2020  PCP: Patient, No Pcp Per  Patient coming from: Home.  Chief Complaint: Tremors and nausea vomiting.  HPI: Robert [name] is a 39 y.o. male with history of alcoholic cardiomyopathy, chronic kidney disease stage IV baseline creatinine around 2 was recently in [place] for a month for alcohol detox and was released about 2 weeks ago following which patient started drinking alcohol again.  Over the last 2 days patient was unable to take anything including his medications with continuous vomiting and epigastric discomfort.  Eventually started having some blood-tinged vomitus 2.  Has been noticing black stools.  Vomiting also made some chest discomfort.  Patient started becoming tremulous and at this point patient was brought to the ER.  ED Course: In the ER patient was tachycardic tremulous hypertensive which improved with IV Ativan.  Chest x-ray was unremarkable EKG shows no new changes.  Labs are significant for creatinine of 2.18 lipase was normal AST of 45 ALT of 52 hemoglobin 12.7 platelets 187 blood glucose of 175 INR 1.8 stool for occult blood is pending digoxin level is 0.4.  Patient admitted for further management of alcohol withdrawal with intractable nausea vomiting with hematemesis.  Review of Systems: As per HPI, rest all negative.   Past Medical History:  Diagnosis Date  . Alcohol use   . Asthma   . CHF (congestive heart failure) ([place])     Past Surgical History:  Procedure Laterality Date  . ESOPHAGOGASTRODUODENOSCOPY (EGD) WITH PROPOFOL N/A 11/01/2019   Procedure: ESOPHAGOGASTRODUODENOSCOPY (EGD) WITH PROPOFOL;  Surgeon: [name], MD;  Location: [facility] ENDOSCOPY;  Service: Endoscopy;  Laterality: N/A;  . RIGHT/LEFT HEART CATH AND CORONARY ANGIOGRAPHY N/A 10/06/2019   Procedure: RIGHT/LEFT HEART CATH AND CORONARY ANGIOGRAPHY;  Surgeon: [name], MD;  Location: [facility];  Service: Cardiovascular;  Laterality: N/A;  . WRIST SURGERY       reports that he has been smoking cigarettes. He has been smoking about 0.50 packs per day. He has never used smokeless tobacco. He reports current alcohol use. He reports current drug use. Drug: Marijuana.  Allergies  Allergen Reactions  . Bactrim [Sulfamethoxazole-Trimethoprim] Rash    Family History  Problem Relation Age of Onset  . Heart failure Father   . Diabetes Father   . Kidney disease Father     Prior to Admission medications   Medication Sig Start Date End Date Taking? Authorizing Provider  dapagliflozin propanediol (FARXIGA) 10 MG TABS tablet Take 1 tablet (10 mg total) by mouth daily before breakfast. 12/09/19   Bensimhon, Shaune Pascal, MD  digoxin (LANOXIN) 0.125 MG tablet Take 1 tablet (0.125 mg total) by mouth daily. 10/14/19   Lyda Jester M, PA-C  famotidine (PEPCID) 20 MG tablet Take 1 tablet (20 mg total) by mouth 2 (two) times daily. 01/10/20   Mesner, Corene Cornea, MD  folic acid (FOLVITE) 1 MG tablet Take 1 tablet (1 mg total) by mouth daily. 11/08/19   Clegg, Amy D, NP  furosemide (LASIX) 40 MG tablet Take 1 tablet (40 mg total) by mouth daily as needed for fluid or edema. Patient not taking: Reported on 01/21/2020 01/13/20   Bensimhon, Shaune Pascal, MD  ivabradine (CORLANOR) 5 MG TABS tablet Take 1 tablet (5 mg total) by mouth 2 (two) times daily with a meal. 10/13/19   Consuelo Pandy, PA-C  Magnesium Oxide (MAG-200) 200 MG TABS Take 200 mg by  mouth 2 (two) times daily.    [provider]  magnesium oxide (MAG-OX) 400 MG tablet Take 400 mg by mouth 2 (two) times daily. Take 1 tablet by mouth twice daily.    [provider]  ondansetron (ZOFRAN) 4 MG tablet Take 1 tablet (4 mg total) by mouth every 8 (eight) hours as needed for nausea or vomiting. Patient not taking: Reported on 01/21/2020 01/10/20   Mesner, Corene Cornea, MD  pantoprazole (PROTONIX) 40 MG tablet Take 40 mg by mouth 2  (two) times daily. Take 1 tablet by mouth twice daily.    [provider]  QUEtiapine (SEROQUEL) 100 MG tablet Take 150 mg by mouth daily. Take one and one-half tablets by mouth daily as needed.    [provider]  rivaroxaban (XARELTO) 20 MG TABS tablet Take 1 tablet (20 mg total) by mouth daily. 11/03/19   Thurnell Lose, MD  sacubitril-valsartan (ENTRESTO) 49-51 MG Take 1 tablet by mouth 2 (two) times daily. 01/13/20   Bensimhon, Shaune Pascal, MD  sertraline (ZOLOFT) 25 MG tablet Take 1 tablet (25 mg total) by mouth daily. 11/25/19 11/24/20  Clegg, Amy D, NP  spironolactone (ALDACTONE) 25 MG tablet Take 1 tablet (25 mg total) by mouth daily. 12/30/19   Bensimhon, Shaune Pascal, MD  thiamine 100 MG tablet Take 1 tablet (100 mg total) by mouth daily. 11/03/19   Thurnell Lose, MD    Physical Exam: Constitutional: Moderately built and nourished. Vitals:   03/06/20 0400 03/06/20 0500 03/06/20 0530 03/06/20 0600  BP: (!) 142/93 (!) 122/92 (!) 134/91 138/87  Pulse: (!) 117 (!) 107 94 (!) 104  Resp: 20 (!) 22 20 18   Temp:      TempSrc:      SpO2: 96% 97% 98% 95%  Weight:      Height:       Eyes: Anicteric no pallor. ENMT: No discharge from the ears eyes nose or mouth. Neck: No mass felt.  No neck rigidity. Respiratory: No rhonchi or crepitations. Cardiovascular: S1/S2 heard. Abdomen: Soft nontender bowel sounds present. Musculoskeletal: No edema. Skin: No rash. Neurologic: Alert awake oriented to time place and person.  Moves all extremities. Psychiatric: Appears normal.  Normal affect.   Labs on Admission: I have personally reviewed following labs and imaging studies  CBC: Recent Labs  Lab 03/06/20 0314  WBC 7.9  NEUTROABS 6.6  HGB 12.7*  HCT 38.1*  MCV 89.6  PLT 867   Basic Metabolic Panel: Recent Labs  Lab 03/06/20 0314  NA 136  K 3.9  CL 95*  CO2 20*  GLUCOSE 175*  BUN 27*  CREATININE 2.18*  CALCIUM 9.1   GFR: Estimated Creatinine Clearance: 52.1  mL/min (A) (by C-G formula based on SCr of 2.18 mg/dL (H)). Liver Function Tests: Recent Labs  Lab 03/06/20 0314  AST 45*  ALT 52*  ALKPHOS 51  BILITOT 1.8*  PROT 8.3*  ALBUMIN 4.8   Recent Labs  Lab 03/06/20 0314  LIPASE 36   No results for input(s): AMMONIA in the last 168 hours. Coagulation Profile: Recent Labs  Lab 03/06/20 0314  INR 1.8*   Cardiac Enzymes: No results for input(s): CKTOTAL, CKMB, CKMBINDEX, TROPONINI in the last 168 hours. BNP (last 3 results) No results for input(s): PROBNP in the last 8760 hours. HbA1C: No results for input(s): HGBA1C in the last 72 hours. CBG: No results for input(s): GLUCAP in the last 168 hours. Lipid Profile: No results for input(s): CHOL, HDL, LDLCALC, TRIG,  CHOLHDL, LDLDIRECT in the last 72 hours. Thyroid Function Tests: No results for input(s): TSH, T4TOTAL, FREET4, T3FREE, THYROIDAB in the last 72 hours. Anemia Panel: No results for input(s): VITAMINB12, FOLATE, FERRITIN, TIBC, IRON, RETICCTPCT in the last 72 hours. Urine analysis: No results found for: COLORURINE, APPEARANCEUR, LABSPEC, PHURINE, GLUCOSEU, HGBUR, BILIRUBINUR, KETONESUR, PROTEINUR, UROBILINOGEN, NITRITE, LEUKOCYTESUR Sepsis Labs: @LABRCNTIP (procalcitonin:4,lacticidven:4) )No results found for this or any previous visit (from the past 240 hour(s)).   Radiological Exams on Admission: DG Chest Port 1 View  Result Date: 03/06/2020 CLINICAL DATA:  Vomiting and chest pain EXAM: PORTABLE CHEST 1 VIEW COMPARISON:  01/09/2020 FINDINGS: The heart size and mediastinal contours are within normal limits. Both lungs are clear. The visualized skeletal structures are unremarkable. IMPRESSION: No active disease. Electronically Signed   By: Ulyses Jarred M.D.   On: 03/06/2020 03:40    EKG: Independently reviewed.  Normal sinus rhythm with T wave changes comparable to old EKG.  Assessment/Plan Principal Problem:   Alcohol withdrawal (HCC) Active Problems:   Chronic  systolic CHF (congestive heart failure) (HCC)   Inappropriate sinus tachycardia   Nausea & vomiting    1. Alcohol withdrawal for which I have placed patient on CIWA protocol using IV Ativan and thiamine.  Social work consult. 2. Intractable nausea vomiting with hematemesis likely related to alcohol gastritis.  Will check serial CBCs.  If patient on Protonix n.p.o. for now consult gastroenterologist for which I have sent [facility] secure chat message.  Patient did have EGD in June 2021 which showed nothing acute except for some erythematous mucosa. 3. Alcoholic cardiomyopathy presently appears compensated presently n.p.o.  We need to restart medicines once patient is able to tolerate oral diet and making sure there is no severe bleeding. 4. Hypertensive urgency improved with IV Ativan.  We will keep patient as needed IV hydralazine. 5. Chronic anemia follow CBC given that patient may have bleeding. 6. History of pulmonary embolism on Xarelto has not taken it for last 2 days.  Holding it for now since patient is complaining of hematemesis. 7. Chronic kidney disease stage IV creatinine is baseline around 1.5-2.  Has acute worsening.  Could be from vomiting.  Follow metabolic panel closely.  Holding patient's Entresto Lasix for now given that patient has possible GI bleed. 8. Elevated LFTs likely from alcoholic hepatitis.  Follow LFTs.  Since patient has alcohol withdrawal with possible hematemesis intractable nausea vomiting worsening renal function will need close monitoring for any further worsening in inpatient status.  Covid test is pending.   DVT prophylaxis: SCDs.  Avoiding anticoagulation due to possible GI bleed. Code Status: Full code. Family Communication: Patient's mother at the bedside. Disposition Plan: Home. Consults called: [facility] was sent a secure chat message. Admission status: Inpatient.   Rise Patience MD [facility] Pager [phone].  If 7PM-7AM, please  contact night-coverage [email or web address] Password [facility]  03/06/2020, 6:31 AM

## 2020-03-06 NOTE — Progress Notes (Signed)
PROGRESS NOTE    Robert [name]  BOF:[id number] DOB: Mar 24, 1981 DOA: 03/06/2020 PCP: Patient, No Pcp Per    Brief Narrative:  Robert [name] was admitted to the hospital with the working diagnosis of acute alcohol withdrawal syndrome.  39 year old male past medical history for alcohol abuse, alcoholic cardiomyopathy, chronic kidney disease stage IV who was recently released from [facility] 2 weeks ago after a month of alcohol detox.  At home patient continued to drink alcohol, he developed nausea, vomiting abdominal pain who prompted him to come back to the hospital.  Positive black stools.  On his initial physical examination blood pressure 142/93, heart rate 117, respiratory rate 22, oxygen saturation 98%.  His lungs were clear to auscultation bilaterally, heart S1-S2, present rhythm, soft abdomen, no lower extremity edema. Sodium 136, potassium 3.9, chloride 95, bicarb 20, glucose 175, BUN 27, creatinine 2.1, AST 45, ALT 52, white count 7.9, hemoglobin 12.7, hematocrit 38.1, platelets 185.  SARS COVID-19 negative. Chest radiograph with cardiomegaly, increased interstitial markings bilaterally. EKG 89 bpm, normal axis, QTC 550, sinus rhythm, q-wave lead II, III, aVF, no ST segment changes, negative T wave lead II, III, AVF,  I, aVL, V3 through V6.  Assessment & Plan:   Principal Problem:   Alcohol withdrawal ([place]) Active Problems:   Chronic systolic CHF (congestive heart failure) (HCC)   Inappropriate sinus tachycardia   Nausea & vomiting   1. Acute alcohol withdrawal syndrome/ acute alcoholic hepatitis. Patient continue to have withdrawal syndrome, positive tremors and anxiety.   Continue with alcohol withdrawal protocol per CIWA with lorazepam, will add bid clonazepam for now.  Continue with nutritional supplementation, thiamine and vitamins.  Follow LFT in am, his INR on admission is 1,8.   2. Chronic systolic heart failure. Non ischemic cardiomyopathy. Hold on IV fluids, for  now. Continue close follow up on blood pressure. No clinical signs of acute decompensation.  Resume digoxin.  For now will continue to hold on entresto, ivabradine, spironalctone and empaglifozin  and furosemide to prevent hypotension.   3. Uncontrolled HTN/ urgency. Likely related to withdrawal syndrome, continue supportive medical therapy.   4. Chronic anemia. Close follow up on cell count. No signs of active bleeding, no indication for endoscopy per GI consultation.  Follow up Hgb stable at 12.3 with Hct at 37.8.   5. Hx of pulmonary embolism. Will resume rivaroxaban and close follow up for any signs of bleeding.   6. AKI on CKD stage 4. Renal function with cr at 2,18, will continue with supportive medical therapy and follow up on renal function in am.   7. Depression. Continue with sertraline and quetiapine.   Patient continue to be at high risk for worsening withdrawal syndrome.   Status is: Inpatient  Remains inpatient appropriate because:IV treatments appropriate due to intensity of illness or inability to take PO   Dispo: The patient is from: Home              Anticipated d/c is to: Home              Anticipated d/c date is: 3 days              Patient currently is not medically stable to d/c.   DVT prophylaxis: rivaroxaban   Code Status:   full  Family Communication:  No family at the bedside        Consultants:   GI   Subjective: Patient is awake and alert, continue to have tremors and anxiety,  no nausea or vomiting, no dyspnea or chest pain.   Objective: Vitals:   03/06/20 0730 03/06/20 0753 03/06/20 0824 03/06/20 1248  BP: 115/76  139/81 124/80  Pulse: (!) 102  (!) 107 (!) 103  Resp: 18  18 15   Temp:  99.3 F (37.4 C) 98.3 F (36.8 C) 97.8 F (36.6 C)  TempSrc:  Oral Oral Oral  SpO2: 100%  100% 100%  Weight:      Height:       No intake or output data in the 24 hours ending 03/06/20 1426 Filed Weights   03/06/20 0254  Weight: 90.7 kg     Examination:   General: Not in pain or dyspnea, deconditioned  Neurology: Awake and alert, positive tremors bilateral hands.   E ENT: no pallor, no icterus, oral mucosa moist Cardiovascular: No JVD. S1-S2 present, rhythmic, no gallops, rubs, or murmurs. No lower extremity edema. Pulmonary: positive breath sounds bilaterally, with, no wheezing, rhonchi or rales. Gastrointestinal. Abdomen soft and non tender Skin. No rashes Musculoskeletal: no joint deformities     Data Reviewed: I have personally reviewed following labs and imaging studies  CBC: Recent Labs  Lab 03/06/20 0314 03/06/20 0630 03/06/20 1014  WBC 7.9 8.4 7.6  NEUTROABS 6.6  --   --   HGB 12.7* 11.9* 12.1*  HCT 38.1* 34.9* 35.2*  MCV 89.6 88.1 87.3  PLT 185 159 321   Basic Metabolic Panel: Recent Labs  Lab 03/06/20 0314  NA 136  K 3.9  CL 95*  CO2 20*  GLUCOSE 175*  BUN 27*  CREATININE 2.18*  CALCIUM 9.1   GFR: Estimated Creatinine Clearance: 52.1 mL/min (A) (by C-G formula based on SCr of 2.18 mg/dL (H)). Liver Function Tests: Recent Labs  Lab 03/06/20 0314  AST 45*  ALT 52*  ALKPHOS 51  BILITOT 1.8*  PROT 8.3*  ALBUMIN 4.8   Recent Labs  Lab 03/06/20 0314  LIPASE 36   No results for input(s): AMMONIA in the last 168 hours. Coagulation Profile: Recent Labs  Lab 03/06/20 0314  INR 1.8*   Cardiac Enzymes: No results for input(s): CKTOTAL, CKMB, CKMBINDEX, TROPONINI in the last 168 hours. BNP (last 3 results) No results for input(s): PROBNP in the last 8760 hours. HbA1C: No results for input(s): HGBA1C in the last 72 hours. CBG: Recent Labs  Lab 03/06/20 0646 03/06/20 1156  GLUCAP 172* 91   Lipid Profile: No results for input(s): CHOL, HDL, LDLCALC, TRIG, CHOLHDL, LDLDIRECT in the last 72 hours. Thyroid Function Tests: No results for input(s): TSH, T4TOTAL, FREET4, T3FREE, THYROIDAB in the last 72 hours. Anemia Panel: No results for input(s): VITAMINB12, FOLATE, FERRITIN,  TIBC, IRON, RETICCTPCT in the last 72 hours.    Radiology Studies: I have reviewed all of the imaging during this hospital visit personally     Scheduled Meds: . folic acid  1 mg Oral Daily  . LORazepam  0-4 mg Intravenous Q6H   Followed by  . [START ON 03/08/2020] LORazepam  0-4 mg Intravenous Q12H  . multivitamin with minerals  1 tablet Oral Daily  . pantoprazole (PROTONIX) IV  40 mg Intravenous Q12H  . sucralfate  1 g Oral TID WC & HS  . thiamine  100 mg Oral Daily   Or  . thiamine  100 mg Intravenous Daily   Continuous Infusions:   LOS: 0 days        Robert Minier Gerome Apley, MD

## 2020-03-06 NOTE — ED Provider Notes (Signed)
[place] DEPT Provider Note   CSN: [id number] Arrival date & time: 03/06/20  0240     History Chief Complaint  Patient presents with  . Chest Pain    Robert [name] is a 39 y.o. male.  The history is provided by the patient.  Emesis Severity:  Moderate Duration:  12 hours Timing:  Intermittent Progression:  Worsening Chronicity:  New Relieved by:  None tried Worsened by:  Nothing Associated symptoms: abdominal pain   Associated symptoms: no fever   Patient with history of nonischemic cardiomyopathy, pulmonary embolism, history of alcohol abuse presents with vomiting.  Patient reports over the past day he has had multiple episodes of vomiting with small amount of blood in his emesis.  He also reports some chest and abdominal pain that is improving.  He reports generalized weakness and feeling very " shaky" Patient admits to heavy alcohol abuse.  Patient was recently at [facility] for about a month, but upon discharge he began drinking again. He suspects that he is going through alcohol withdrawal.  Patient does admit to having black stool recently     Past Medical History:  Diagnosis Date  . Alcohol use   . Asthma   . CHF (congestive heart failure) [facility])     Patient Active Problem List   Diagnosis Date Noted  . AKI (acute kidney injury) ([place]) 10/30/2019  . Gastritis 10/30/2019  . Elevated INR 10/30/2019  . 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[place])   . Acute heart failure ([place]) 10/05/2019  . Pulmonary embolism ([place]) 10/04/2019  . Acute pulmonary embolism ([place]) 10/04/2019  . Chronic systolic CHF (congestive heart failure) ([place])   . Inappropriate sinus tachycardia   . Chest pain 11/03/2013  . Tobacco abuse 11/03/2013    Past Surgical History:  Procedure Laterality Date  . ESOPHAGOGASTRODUODENOSCOPY (EGD) WITH PROPOFOL N/A 11/01/2019   Procedure: ESOPHAGOGASTRODUODENOSCOPY (EGD) WITH PROPOFOL;   Surgeon: [name], MD;  Location: [facility] ENDOSCOPY;  Service: Endoscopy;  Laterality: N/A;  . RIGHT/LEFT HEART CATH AND CORONARY ANGIOGRAPHY N/A 10/06/2019   Procedure: RIGHT/LEFT HEART CATH AND CORONARY ANGIOGRAPHY;  Surgeon: [name], MD;  Location: [place] CV LAB;  Service: Cardiovascular;  Laterality: N/A;  . WRIST SURGERY         Family History  Problem Relation Age of Onset  . Heart failure Father   . Diabetes Father   . Kidney disease Father     Social History   Tobacco Use  . Smoking status: Current Every Day Smoker    Packs/day: 0.50    Types: Cigarettes  . Smokeless tobacco: Never Used  Vaping Use  . Vaping Use: Former  Substance Use Topics  . Alcohol use: Yes  . Drug use: Yes    Types: Marijuana    Home Medications Prior to Admission medications   Medication Sig Start Date End Date Taking? Authorizing Provider  dapagliflozin propanediol (FARXIGA) 10 MG TABS tablet Take 1 tablet (10 mg total) by mouth daily before breakfast. 12/09/19   Bensimhon, Shaune Pascal, MD  digoxin (LANOXIN) 0.125 MG tablet Take 1 tablet (0.125 mg total) by mouth daily. 10/14/19   Lyda Jester M, PA-C  famotidine (PEPCID) 20 MG tablet Take 1 tablet (20 mg total) by mouth 2 (two) times daily. 01/10/20   Mesner, Corene Cornea, MD  folic acid (FOLVITE) 1 MG tablet Take 1 tablet (1 mg total) by mouth daily. 11/08/19   Clegg, Amy D, NP  furosemide (LASIX)  40 MG tablet Take 1 tablet (40 mg total) by mouth daily as needed for fluid or edema. Patient not taking: Reported on 01/21/2020 01/13/20   Bensimhon, Shaune Pascal, MD  ivabradine (CORLANOR) 5 MG TABS tablet Take 1 tablet (5 mg total) by mouth 2 (two) times daily with a meal. 10/13/19   Consuelo Pandy, PA-C  Magnesium Oxide (MAG-200) 200 MG TABS Take 200 mg by mouth 2 (two) times daily.    [provider]  magnesium oxide (MAG-OX) 400 MG tablet Take 400 mg by mouth 2 (two) times daily. Take 1 tablet by mouth twice daily.    [provider]  ondansetron (ZOFRAN) 4 MG tablet Take 1 tablet (4 mg total) by mouth every 8 (eight) hours as needed for nausea or vomiting. Patient not taking: Reported on 01/21/2020 01/10/20   Mesner, Corene Cornea, MD  pantoprazole (PROTONIX) 40 MG tablet Take 40 mg by mouth 2 (two) times daily. Take 1 tablet by mouth twice daily.    [provider]  QUEtiapine (SEROQUEL) 100 MG tablet Take 150 mg by mouth daily. Take one and one-half tablets by mouth daily as needed.    [provider]  rivaroxaban (XARELTO) 20 MG TABS tablet Take 1 tablet (20 mg total) by mouth daily. 11/03/19   Thurnell Lose, MD  sacubitril-valsartan (ENTRESTO) 49-51 MG Take 1 tablet by mouth 2 (two) times daily. 01/13/20   Bensimhon, Shaune Pascal, MD  sertraline (ZOLOFT) 25 MG tablet Take 1 tablet (25 mg total) by mouth daily. 11/25/19 11/24/20  Clegg, Amy D, NP  spironolactone (ALDACTONE) 25 MG tablet Take 1 tablet (25 mg total) by mouth daily. 12/30/19   Bensimhon, Shaune Pascal, MD  thiamine 100 MG tablet Take 1 tablet (100 mg total) by mouth daily. 11/03/19   Thurnell Lose, MD    Allergies    Bactrim [sulfamethoxazole-trimethoprim]  Review of Systems   Review of Systems  Constitutional: Positive for fatigue. Negative for fever.  Cardiovascular: Positive for chest pain.  Gastrointestinal: Positive for abdominal pain and vomiting.  Neurological: Positive for weakness.  All other systems reviewed and are negative.   Physical Exam Updated Vital Signs BP (!) 149/96 (BP Location: Right Arm)   Pulse (!) 104   Temp (!) 97.4 F (36.3 C) (Oral)   Resp 18   Ht 1.778 m (5\' 10" )   Wt 90.7 kg   SpO2 96%   BMI 28.70 kg/m   Physical Exam  CONSTITUTIONAL: Disheveled, ill-appearing HEAD: Normocephalic/atraumatic EYES: EOMI/PERRL, conjunctiva pink ENMT: Mucous membranes moist NECK: supple no meningeal signs SPINE/BACK:entire spine nontender CV: S1/S2 noted, tachycardic LUNGS: Lungs are clear to auscultation  bilaterally, no apparent distress ABDOMEN: soft, nontender, no rebound or guarding, bowel sounds noted throughout abdomen GU:no cva tenderness Rectal-unable to obtain stool, nurse present for exam NEURO: Pt is awake/alert/appropriate, moves all extremitiesx4.  No facial droop.  Patient is tremulous EXTREMITIES: pulses normal/equal, full ROM SKIN: warm, color normal PSYCH: Mildly anxious ED Results / Procedures / Treatments   Labs (all labs ordered are listed, but only abnormal results are displayed) Labs Reviewed  CBC WITH DIFFERENTIAL/PLATELET - Abnormal; Notable for the following components:      Result Value   Hemoglobin 12.7 (*)    HCT 38.1 (*)    All other components within normal limits  COMPREHENSIVE METABOLIC PANEL - Abnormal; Notable for the following components:   Chloride 95 (*)    CO2 20 (*)    Glucose, Bld 175 (*)  BUN 27 (*)    Creatinine, Ser 2.18 (*)    Total Protein 8.3 (*)    AST 45 (*)    ALT 52 (*)    Total Bilirubin 1.8 (*)    GFR, Estimated 39 (*)    Anion gap 21 (*)    All other components within normal limits  PROTIME-INR - Abnormal; Notable for the following components:   Prothrombin Time 19.9 (*)    INR 1.8 (*)    All other components within normal limits  DIGOXIN LEVEL - Abnormal; Notable for the following components:   Digoxin Level 0.4 (*)    All other components within normal limits  RESPIRATORY PANEL BY RT PCR (FLU A&B, COVID)  LIPASE, BLOOD  POC OCCULT BLOOD, ED  TYPE AND SCREEN    EKG EKG Interpretation  Date/Time:  Monday March 06 2020 02:55:33 EDT Ventricular Rate:  89 PR Interval:    QRS Duration: 87 QT Interval:  418 QTC Calculation: 509 R Axis:   69 Text Interpretation: Sinus rhythm Probable LVH with secondary repol abnrm Prolonged QT interval No significant change since last tracing Confirmed by Ripley Fraise [phone]) on 03/06/2020 3:01:29 AM   Radiology DG Chest Port 1 View  Result Date: 03/06/2020 CLINICAL  DATA:  Vomiting and chest pain EXAM: PORTABLE CHEST 1 VIEW COMPARISON:  01/09/2020 FINDINGS: The heart size and mediastinal contours are within normal limits. Both lungs are clear. The visualized skeletal structures are unremarkable. IMPRESSION: No active disease. Electronically Signed   By: Ulyses Jarred M.D.   On: 03/06/2020 03:40    Procedures Procedures  Medications Ordered in ED Medications  LORazepam (ATIVAN) injection 1 mg (1 mg Intravenous Given 03/06/20 0351)    ED Course  I have reviewed the triage vital signs and the nursing notes.  Pertinent labs & imaging results that were available during my care of the patient were reviewed by me and considered in my medical decision making (see chart for details).    MDM Rules/Calculators/A&P                          3:54 AM Patient with history of nonischemic cardiomyopathy, PE, alcohol abuse presents with vomiting and generalized weakness.  Patient is likely undergoing alcohol withdrawal.  He patient also admits to vomiting blood as well as dark stool.  I was unable to obtain a stool sample, but will check labs to evaluate for acute GI bleed. Patient is on multiple high risk medications including digoxin as well as Xarelto We will follow closely. He currently has no focal abdominal tenderness, will defer imaging 5:32 AM Patient with evidence of acute kidney injury, but also has persistent signs of alcohol withdrawal.  Patient is tachycardic at rest, is now describing hallucinations.  CIWA scores been fluctuating from 9-13. Patient will be admitted to the hospital for further management 5:44 AM Discussed with Dr. Hal Hope for admission  Final Clinical Impression(s) / ED Diagnoses Final diagnoses:  Alcohol withdrawal syndrome with perceptual disturbance ([place])  AKI (acute kidney injury) [facility])    Rx / [place] Orders ED Discharge Orders    None       Ripley Fraise, MD 03/06/20 [phone]

## 2020-03-07 ENCOUNTER — Other Ambulatory Visit (HOSPITAL_COMMUNITY): Payer: Self-pay | Admitting: *Deleted

## 2020-03-07 LAB — CBC WITH DIFFERENTIAL/PLATELET
Abs Immature Granulocytes: 0.03 10*3/uL (ref 0.00–0.07)
Basophils Absolute: 0 10*3/uL (ref 0.0–0.1)
Basophils Relative: 0 %
Eosinophils Absolute: 0.2 10*3/uL (ref 0.0–0.5)
Eosinophils Relative: 4 %
HCT: 38.1 % — ABNORMAL LOW (ref 39.0–52.0)
Hemoglobin: 12.5 g/dL — ABNORMAL LOW (ref 13.0–17.0)
Immature Granulocytes: 1 %
Lymphocytes Relative: 24 %
Lymphs Abs: 1.3 10*3/uL (ref 0.7–4.0)
MCH: 29.5 pg (ref 26.0–34.0)
MCHC: 32.8 g/dL (ref 30.0–36.0)
MCV: 89.9 fL (ref 80.0–100.0)
Monocytes Absolute: 0.5 10*3/uL (ref 0.1–1.0)
Monocytes Relative: 8 %
Neutro Abs: 3.6 10*3/uL (ref 1.7–7.7)
Neutrophils Relative %: 63 %
Platelets: 156 10*3/uL (ref 150–400)
RBC: 4.24 MIL/uL (ref 4.22–5.81)
RDW: 12.8 % (ref 11.5–15.5)
WBC: 5.7 10*3/uL (ref 4.0–10.5)
nRBC: 0 % (ref 0.0–0.2)

## 2020-03-07 LAB — GLUCOSE, CAPILLARY
Glucose-Capillary: 108 mg/dL — ABNORMAL HIGH (ref 70–99)
Glucose-Capillary: 111 mg/dL — ABNORMAL HIGH (ref 70–99)
Glucose-Capillary: 192 mg/dL — ABNORMAL HIGH (ref 70–99)
Glucose-Capillary: 114 mg/dL — ABNORMAL HIGH (ref 70–99)

## 2020-03-07 LAB — COMPREHENSIVE METABOLIC PANEL
ALT: 57 U/L — ABNORMAL HIGH (ref 0–44)
AST: 59 U/L — ABNORMAL HIGH (ref 15–41)
Albumin: 4.6 g/dL (ref 3.5–5.0)
Alkaline Phosphatase: 54 U/L (ref 38–126)
Anion gap: 14 (ref 5–15)
BUN: 42 mg/dL — ABNORMAL HIGH (ref 6–20)
CO2: 26 mmol/L (ref 22–32)
Calcium: 9.2 mg/dL (ref 8.9–10.3)
Chloride: 96 mmol/L — ABNORMAL LOW (ref 98–111)
Creatinine, Ser: 2.37 mg/dL — ABNORMAL HIGH (ref 0.61–1.24)
GFR, Estimated: 35 mL/min — ABNORMAL LOW (ref >60)
Glucose, Bld: 98 mg/dL (ref 70–99)
Potassium: 3.1 mmol/L — ABNORMAL LOW (ref 3.5–5.1)
Sodium: 136 mmol/L (ref 135–145)
Total Bilirubin: 1.1 mg/dL (ref 0.3–1.2)
Total Protein: 8.1 g/dL (ref 6.5–8.1)

## 2020-03-07 LAB — PROTIME-INR
INR: 1.3 — ABNORMAL HIGH (ref 0.8–1.2)
Prothrombin Time: 15.4 seconds — ABNORMAL HIGH (ref 11.4–15.2)

## 2020-03-07 MED ORDER — TRAZODONE HCL 50 MG PO TABS
50.0000 mg | ORAL_TABLET | Freq: Every day | ORAL | Status: DC
  Administered 2020-03-07: 50 mg via ORAL
  Filled 2020-03-07: qty 1

## 2020-03-07 MED ORDER — POTASSIUM CHLORIDE CRYS ER 20 MEQ PO TBCR
40.0000 meq | EXTENDED_RELEASE_TABLET | ORAL | Status: AC
  Administered 2020-03-07 (×2): 40 meq via ORAL
  Filled 2020-03-07 (×2): qty 2

## 2020-03-07 MED ORDER — FUROSEMIDE 40 MG PO TABS: 40.0000 mg | ORAL_TABLET | Freq: Every day | ORAL | Status: DC | PRN

## 2020-03-07 MED ORDER — IVABRADINE HCL 5 MG PO TABS: 5.0000 mg | ORAL_TABLET | Freq: Two times a day (BID) | ORAL | 5 refills | Status: DC

## 2020-03-07 MED ORDER — DAPAGLIFLOZIN PROPANEDIOL 10 MG PO TABS
10.0000 mg | ORAL_TABLET | Freq: Every day | ORAL | Status: DC
  Administered 2020-03-08: 10 mg via ORAL
  Filled 2020-03-07: qty 1

## 2020-03-07 MED ORDER — SACUBITRIL-VALSARTAN 49-51 MG PO TABS
1.0000 | ORAL_TABLET | Freq: Two times a day (BID) | ORAL | Status: DC
  Administered 2020-03-07 – 2020-03-08 (×2): 1 via ORAL
  Filled 2020-03-07 (×3): qty 1

## 2020-03-07 MED ORDER — IVABRADINE HCL 5 MG PO TABS
5.0000 mg | ORAL_TABLET | Freq: Two times a day (BID) | ORAL | Status: DC
  Administered 2020-03-07 – 2020-03-08 (×3): 5 mg via ORAL
  Filled 2020-03-07 (×3): qty 1

## 2020-03-07 MED ORDER — PANTOPRAZOLE SODIUM 40 MG PO TBEC
40.0000 mg | DELAYED_RELEASE_TABLET | Freq: Two times a day (BID) | ORAL | Status: DC
  Administered 2020-03-07 – 2020-03-08 (×3): 40 mg via ORAL
  Filled 2020-03-07 (×3): qty 1

## 2020-03-07 MED ORDER — SPIRONOLACTONE 25 MG PO TABS
25.0000 mg | ORAL_TABLET | Freq: Every day | ORAL | Status: DC
  Administered 2020-03-07 – 2020-03-08 (×2): 25 mg via ORAL
  Filled 2020-03-07 (×2): qty 1

## 2020-03-07 NOTE — Progress Notes (Signed)
PROGRESS NOTE    Brylan Dec  VHQ:[id number] DOB: [birth year] DOA: 03/06/2020 PCP: Patient, No Pcp Per    Brief Narrative:  Mr. Bress was admitted to the hospital with the working diagnosis of acute alcohol withdrawal syndrome.  39 year old male past medical history for alcohol abuse, alcoholic cardiomyopathy, chronic kidney disease stage IV who was recently released from [facility] 2 weeks ago after a month of alcohol detox.  At home patient continued to drink alcohol, he developed nausea, vomiting abdominal pain who prompted him to come back to the hospital.  Positive black stools.  On his initial physical examination blood pressure 142/93, heart rate 117, respiratory rate 22, oxygen saturation 98%.  His lungs were clear to auscultation bilaterally, heart S1-S2, present rhythm, soft abdomen, no lower extremity edema. Sodium 136, potassium 3.9, chloride 95, bicarb 20, glucose 175, BUN 27, creatinine 2.1, AST 45, ALT 52, white count 7.9, hemoglobin 12.7, hematocrit 38.1, platelets 185.  SARS COVID-19 negative. Chest radiograph with cardiomegaly, increased interstitial markings bilaterally. EKG 89 bpm, normal axis, QTC 550, sinus rhythm, q-wave lead II, III, aVF, no ST segment changes, negative T wave lead II, III, AVF,  I, aVL, V3 through V6.  Patient has been placed on as needed and scheduled benzodiazepines, clinically improving but not yet back to baseline. Continue to have hallucinations.    Assessment & Plan:   Principal Problem:   Alcohol withdrawal ([place]) Active Problems:   Chronic systolic CHF (congestive heart failure) (HCC)   Inappropriate sinus tachycardia   Nausea & vomiting   1. Acute alcohol withdrawal syndrome/ acute alcoholic hepatitis. Improved symptoms but not yet back to baseline, tremors improved, but continue with hallucinations at night.  This am somnolent.   Discontinue clonazepam and continue with CIWA protocol with lorazepam. Add trazodone at night  for sleep.  On nutritional supplementation, thiamine and vitamins. Continue mild elevation of LFT: 59 AST and 57 ALT, total bilirubin is down to 1,1 and INR is down to 1,3.   Continue supportive medical care. Out of bed to chair tid with meals, will consult PT and OT in preparation for possible dc in am.  Advance diet to heart healthy.   2. Chronic systolic heart failure. Non ischemic cardiomyopathy. No clinical signs of decompensation, will resume his cardiac regimen with : entresto, ivabradine, spironalctone and empaglifozin, furosemide.  Continue with digoxin.   3. Uncontrolled HTN/ urgency. Will resume afterload reducing agents for heart failure and diuretics.  4. Chronic anemia. No indication for endoscopy per GI consultation. Continue stable hgb at 12,5 and hct at 38,1.   5. Hx of pulmonary embolism. Continue anticoagulation with rivaroxaban. No clinical signs of bleeding.  6. AKI on CKD stage 4. Stable renal function with serum cr at 2,37 with K down to 3,. Bicarbonate at 26.  Add 80 meq Kcl po in 2 divided doses and follow up on renal function in am. Resume home dose of furosemide and spironolactone.   7. Depression. On sertraline and quetiapine.      Status is: Inpatient  Remains inpatient appropriate because:Inpatient level of care appropriate due to severity of illness   Dispo: The patient is from: Home              Anticipated d/c is to: Home              Anticipated d/c date is: 1 day              Patient currently is medically stable to  d/c. Possible dc home in am, if continue to improve clinically.    DVT prophylaxis: rivaroxaban   Code Status:    full  Family Communication:  I spoke with patient's mother at the bedside, we talked in detail about patient's condition, plan of care and prognosis and all questions were addressed.       Subjective: Patient this am is somnolent, tremors have improved, but he is somnolent, not yet at his baseline. Positive  tachycardia with movement.   Objective: Vitals:   03/06/20 2119 03/06/20 2348 03/07/20 0530 03/07/20 1154  BP: 126/78 127/89 (!) 129/91 120/80  Pulse: (!) 53 98 88 95  Resp: 18 20 20 14   Temp: 99.1 F (37.3 C) 98.4 F (36.9 C) 98 F (36.7 C) 98.7 F (37.1 C)  TempSrc: Oral Oral Oral Oral  SpO2: 99% 98% 100% 100%  Weight:      Height:        Intake/Output Summary (Last 24 hours) at 03/07/2020 1419 Last data filed at 03/06/2020 1630 Gross per 24 hour  Intake 240 ml  Output --  Net 240 ml   Filed Weights   03/06/20 0254  Weight: 90.7 kg    Examination:   General: Not in pain or dyspnea, deconditioned  Neurology: somnolent but easy to arouse. No evident tremors.  E ENT: no pallor, no icterus, oral mucosa moist Cardiovascular: No JVD. S1-S2 present, rhythmic, no gallops, rubs, or murmurs. No lower extremity edema. Pulmonary: positive breath sounds bilaterally,  Gastrointestinal. Abdomen soft and non tender Skin. No rashes Musculoskeletal: no joint deformities     Data Reviewed: I have personally reviewed following labs and imaging studies  CBC: Recent Labs  Lab 03/06/20 0314 03/06/20 0630 03/06/20 1014 03/06/20 1402 03/07/20 0358  WBC 7.9 8.4 7.6 6.7 5.7  NEUTROABS 6.6  --   --   --  3.6  HGB 12.7* 11.9* 12.1* 12.3* 12.5*  HCT 38.1* 34.9* 35.2* 37.8* 38.1*  MCV 89.6 88.1 87.3 89.2 89.9  PLT 185 159 159 174 867   Basic Metabolic Panel: Recent Labs  Lab 03/06/20 0314 03/07/20 0358  NA 136 136  K 3.9 3.1*  CL 95* 96*  CO2 20* 26  GLUCOSE 175* 98  BUN 27* 42*  CREATININE 2.18* 2.37*  CALCIUM 9.1 9.2   GFR: Estimated Creatinine Clearance: 47.9 mL/min (A) (by C-G formula based on SCr of 2.37 mg/dL (H)). Liver Function Tests: Recent Labs  Lab 03/06/20 0314 03/07/20 0358  AST 45* 59*  ALT 52* 57*  ALKPHOS 51 54  BILITOT 1.8* 1.1  PROT 8.3* 8.1  ALBUMIN 4.8 4.6   Recent Labs  Lab 03/06/20 0314  LIPASE 36   No results for input(s):  AMMONIA in the last 168 hours. Coagulation Profile: Recent Labs  Lab 03/06/20 0314 03/07/20 0358  INR 1.8* 1.3*   Cardiac Enzymes: No results for input(s): CKTOTAL, CKMB, CKMBINDEX, TROPONINI in the last 168 hours. BNP (last 3 results) No results for input(s): PROBNP in the last 8760 hours. HbA1C: No results for input(s): HGBA1C in the last 72 hours. CBG: Recent Labs  Lab 03/06/20 1156 03/06/20 1742 03/06/20 2344 03/07/20 0526 03/07/20 1150  GLUCAP 91 143* 105* 108* 111*   Lipid Profile: No results for input(s): CHOL, HDL, LDLCALC, TRIG, CHOLHDL, LDLDIRECT in the last 72 hours. Thyroid Function Tests: No results for input(s): TSH, T4TOTAL, FREET4, T3FREE, THYROIDAB in the last 72 hours. Anemia Panel: No results for input(s): VITAMINB12, FOLATE, FERRITIN, TIBC, IRON, RETICCTPCT in the  last 72 hours.    Radiology Studies: I have reviewed all of the imaging during this hospital visit personally     Scheduled Meds: . digoxin  0.125 mg Oral Daily  . folic acid  1 mg Oral Daily  . LORazepam  0-4 mg Intravenous Q6H   Followed by  . [START ON 03/08/2020] LORazepam  0-4 mg Intravenous Q12H  . multivitamin with minerals  1 tablet Oral Daily  . QUEtiapine  150 mg Oral Daily  . rivaroxaban  20 mg Oral Q supper  . sertraline  25 mg Oral Daily  . sucralfate  1 g Oral TID WC & HS  . thiamine  100 mg Oral Daily   Or  . thiamine  100 mg Intravenous Daily  . traZODone  50 mg Oral QHS   Continuous Infusions:   LOS: 1 day        Marigold Mom Gerome Apley, MD

## 2020-03-07 NOTE — Telephone Encounter (Signed)
Called patient's pharmacy, they do not have an active RX for Corlanor.  Asked for the RX to be sent over to [facility].  Charlann Boxer, CPhT

## 2020-03-07 NOTE — Progress Notes (Signed)
[facility] Gastroenterology Progress Note  Robert [name] 39 y.o. 04/16/1981  CC:  Nausea/vomiting  Subjective: Patient reports mild nausea today but states it has improved.  He did not have any episodes of emesis yesterday nor today.  States he had several bowel movements yesterday, which are now brown in color. Denies abdominal pain or chest pain, though states he has some chest discomfort if he coughs.  ROS : Review of Systems  Cardiovascular: Negative for chest pain and palpitations.  Gastrointestinal: Positive for nausea. Negative for abdominal pain, blood in stool, constipation, diarrhea, heartburn, melena and vomiting.   Objective: Vital signs in last 24 hours: Vitals:   03/06/20 2348 03/07/20 0530  BP: 127/89 (!) 129/91  Pulse: 98 88  Resp: 20 20  Temp: 98.4 F (36.9 C) 98 F (36.7 C)  SpO2: 98% 100%    Physical Exam:  General:  Lethargic, oriented, cooperative, no distress, appears stated age  Head:  Normocephalic, without obvious abnormality, atraumatic  Eyes:  Anicteric sclera, EOMs intact  Lungs:   Clear to auscultation bilaterally, respirations unlabored  Heart:  Mildly tachycardic with regular rhythm, S1, S2 normal  Abdomen:   Soft, non-tender, non-distended, bowel sounds active all four quadrants,  no guarding or peritoneal signs  Extremities: Extremities normal, atraumatic, no  edema  Pulses: 2+ and symmetric    Lab Results: Recent Labs    03/06/20 0314 03/07/20 0358  NA 136 136  K 3.9 3.1*  CL 95* 96*  CO2 20* 26  GLUCOSE 175* 98  BUN 27* 42*  CREATININE 2.18* 2.37*  CALCIUM 9.1 9.2   Recent Labs    03/06/20 0314 03/07/20 0358  AST 45* 59*  ALT 52* 57*  ALKPHOS 51 54  BILITOT 1.8* 1.1  PROT 8.3* 8.1  ALBUMIN 4.8 4.6   Recent Labs    03/06/20 0314 03/06/20 0630 03/06/20 1402 03/07/20 0358  WBC 7.9   < > 6.7 5.7  NEUTROABS 6.6  --   --  3.6  HGB 12.7*   < > 12.3* 12.5*  HCT 38.1*   < > 37.8* 38.1*  MCV 89.6   < > 89.2 89.9  PLT 185    < > 174 156   < > = values in this interval not displayed.   Recent Labs    03/06/20 0314 03/07/20 0358  LABPROT 19.9* 15.4*  INR 1.8* 1.3*      Assessment: Nausea/vomiting, improved. -Nausea/vomiting may have been triggered by alcohol use/withdrawal. Suspect gastritis plus esophagitis vs. MWT. -Hgb stable, 12.5 today as compared to 11.9 yesterday.  Xarelto reinitiated yesterday  Transaminitis: T. Bili now WNL ast 1.1 as compared to 1.8 yesterday, though AST/ ALT remain elevated.  AST 59/ ALT 57 as compared to AST 45/ALT 52 yesterday. Possibly related to alcohol use.   Alcohol withdrawal  History of PE, on Xarelto, reinitiated yesterday  CKD stage 4  CHF  Plan: Continue Protonix 40 mg IV BID, transition to PO at time of discharge.  Continue BID dosing for 1 month, then decrease to once daily dosing.  Continue Carafate 4 times daily for 1 week.  Continue to monitor LFTs and H&H.  Our office will call patient to arrange FU in 6-8 weeks.  [facility] will sign off. Thank you for the consultation. Please contact us if we can be of any further assistance during this hospital stay.  Salley Slaughter PA-C 03/07/2020, 8:45 AM  Contact #  [phone]

## 2020-03-08 LAB — COMPREHENSIVE METABOLIC PANEL
ALT: 82 U/L — ABNORMAL HIGH (ref 0–44)
AST: 71 U/L — ABNORMAL HIGH (ref 15–41)
Albumin: 4.3 g/dL (ref 3.5–5.0)
Alkaline Phosphatase: 47 U/L (ref 38–126)
Anion gap: 11 (ref 5–15)
BUN: 37 mg/dL — ABNORMAL HIGH (ref 6–20)
CO2: 26 mmol/L (ref 22–32)
Calcium: 9.3 mg/dL (ref 8.9–10.3)
Chloride: 100 mmol/L (ref 98–111)
Creatinine, Ser: 1.47 mg/dL — ABNORMAL HIGH (ref 0.61–1.24)
GFR, Estimated: >60 mL/min (ref >60)
Glucose, Bld: 101 mg/dL — ABNORMAL HIGH (ref 70–99)
Potassium: 4.1 mmol/L (ref 3.5–5.1)
Sodium: 137 mmol/L (ref 135–145)
Total Bilirubin: 0.8 mg/dL (ref 0.3–1.2)
Total Protein: 7.4 g/dL (ref 6.5–8.1)

## 2020-03-08 LAB — GLUCOSE, CAPILLARY
Glucose-Capillary: 104 mg/dL — ABNORMAL HIGH (ref 70–99)
Glucose-Capillary: 167 mg/dL — ABNORMAL HIGH (ref 70–99)

## 2020-03-08 MED ORDER — SUCRALFATE 1 GM/10ML PO SUSP: 1.0000 g | Freq: Three times a day (TID) | ORAL | 0 refills | Status: DC

## 2020-03-08 MED ORDER — TRAZODONE HCL 50 MG PO TABS: 50.0000 mg | ORAL_TABLET | Freq: Every day | ORAL | 0 refills | Status: DC

## 2020-03-08 NOTE — Discharge Summary (Addendum)
Discharge Summary  Prince Couey VZC:[id number] DOB: 06/29/1980  PCP: Patient, No Pcp Per  Admit date: 03/06/2020 Discharge date: 03/08/2020  Time spent: 35 minutes  Recommendations for Outpatient Follow-up:  1. Follow-up with cardiology 2. Follow-up with your primary care provider 3. Take your medications as prescribed 4. Continue to completely abstain from alcohol use  Discharge Diagnoses:  Active Hospital Problems   Diagnosis Date Noted  . Alcohol withdrawal ([place]) 03/06/2020  . Nausea & vomiting 03/06/2020  . Chronic systolic CHF (congestive heart failure) ([place])   . Inappropriate sinus tachycardia     Resolved Hospital Problems  No resolved problems to display.    Discharge Condition: Stable  Diet recommendation: Heart healthy carb modified diet.  Vitals:   03/08/20 0528 03/08/20 1252  BP: 108/61 120/76  Pulse: 85 87  Resp: 16 18  Temp: 97.9 F (36.6 C) 98 F (36.7 C)  SpO2: 98% 99%    History of present illness:  39 year old male past medical history for alcohol abuse, alcoholic cardiomyopathy, chronic kidney disease stage 2 who was recently released from [facility] 2 weeks prior to his presentation after a month of alcohol detox. At home patient continued to drink alcohol, he developed nausea, vomiting abdominal pain who prompted him to come to the hospital. On his initial physical examination blood pressure 142/93, heart rate 117, respiratory rate 22, oxygen saturation 98%. His lungs were clear to auscultation bilaterally, heart S1-S2, present rhythm, soft abdomen, no lower extremity edema. Sodium 136, potassium 3.9, chloride 95, bicarb 20, glucose 175, BUN 27, creatinine 2.1, AST 45, ALT 52, white count 7.9, hemoglobin 12.7, hematocrit 38.1, platelets 185.SARS COVID-19 negative. Chest radiograph with cardiomegaly, increased interstitial markings bilaterally. EKG 89 bpm, normal axis, QTC 550, sinus rhythm,q-wave lead II,III, aVF, no ST segment  changes, negative T wave leadII, III, AVF,I, aVL, V3 through V6.  Admitted and placed on CIWA protocol due to concern for alcohol withdrawal.   03/08/20: Seen and examined.  No acute events overnight.  Vital signs and labs reviewed and are stable.  Temperature 98, blood pressure 120/76, heart rate 87, respiration rate 18, O2 saturation 99%.  Serum sodium 137, serum potassium 4.1 from 3.1, serum bicarb 26, serum glucose 101, BUN 37, creatinine 1.47 from 2.37, GFR greater than 60, AST 71, ALT 82, total bilirubin 0.8, alkaline phosphatase 47 on 03/08/2020.  Hemoglobin 12.5, hematocrit 38.1, platelet count 156 on 03/07/2020.  At the time of this visit, no evidence of alcohol withdrawal.  Patient is calm, not jittery.  He has no new complaints.  Hospital Course:  Principal Problem:   Alcohol withdrawal (HCC) Active Problems:   Chronic systolic CHF (congestive heart failure) (HCC)   Inappropriate sinus tachycardia   Nausea & vomiting  Resolved acute alcohol withdrawal syndrome/ acute alcoholic hepatitis.   At the time of this visit, no evidence of alcohol withdrawal.  Patient is calm, not jittery.  He has no new complaints. Last benzodiazepine was on 03/06/2020. Patient counseled on alcohol cessation, receptive. States he has a list of places where he can obtain help with sobriety.  Chronic systolic heart failure. Non ischemic cardiomyopathy. No clinical signs of decompensation, will resume his cardiac regimen with : entresto, ivabradine, spironalctone, digoxin, and empaglifozin. Follow-up with your cardiologist.  Resolved uncontrolled HTN/ urgency.  BP is currently at goal Continue home cardiac medications Follow-up with cardiology  Anemia of chronic disease in the setting of chronic alcohol use and CKD.   No indication for endoscopy per GI consultation.  Hemoglobin stable 12.5 and hct at 38.1.   Hx of pulmonary embolism.  Continue anticoagulation with rivaroxaban.  No overt  bleeding.   Resolved AKI on CKD stage 2.  Renal function is back to baseline  Continue to avoid nephrotoxins  Follow-up with your PCP   Chronic depression. Stable Continue home sertraline and quetiapine. Follow-up with your PCP  GERD Resume home regimen Held sucralfate due to possible interaction with digoxin    DVT prophylaxis:      rivaroxaban   Code Status:               full   Discharge Exam: BP 120/76 (BP Location: Left Arm)   Pulse 87   Temp 98 F (36.7 C) (Oral)   Resp 18   Ht 5\' 10"  (1.778 m)   Wt 90.7 kg   SpO2 99%   BMI 28.70 kg/m  . General: 39 y.o. year-old male well developed well nourished in no acute distress.  Alert and oriented x3. . Cardiovascular: Regular rate and rhythm with no rubs or gallops.  No thyromegaly or JVD noted.   [name] Respiratory: Clear to auscultation with no wheezes or rales. Good inspiratory effort. . Abdomen: Soft nontender nondistended with normal bowel sounds x4 quadrants. . Musculoskeletal: No lower extremity edema. 2/4 pulses in all 4 extremities. [name] Psychiatry: Mood is appropriate for condition and setting  Discharge Instructions You were cared for by a hospitalist during your hospital stay. If you have any questions about your discharge medications or the care you received while you were in the hospital after you are discharged, you can call the unit and asked to speak with the hospitalist on call if the hospitalist that took care of you is not available. Once you are discharged, your primary care physician will handle any further medical issues. Please note that NO REFILLS for any discharge medications will be authorized once you are discharged, as it is imperative that you return to your primary care physician (or establish a relationship with a primary care physician if you do not have one) for your aftercare needs so that they can reassess your need for medications and monitor your lab values.   Allergies as of 03/08/2020       Reactions   Bactrim [sulfamethoxazole-trimethoprim] Rash      Medication List    STOP taking these medications   chlordiazePOXIDE 25 MG capsule Commonly known as: LIBRIUM   furosemide 40 MG tablet Commonly known as: LASIX   ondansetron 4 MG tablet Commonly known as: ZOFRAN     TAKE these medications   dapagliflozin propanediol 10 MG Tabs tablet Commonly known as: Farxiga Take 1 tablet (10 mg total) by mouth daily before breakfast.   digoxin 0.125 MG tablet Commonly known as: LANOXIN Take 1 tablet (0.125 mg total) by mouth daily.   famotidine 20 MG tablet Commonly known as: Pepcid Take 1 tablet (20 mg total) by mouth 2 (two) times daily.   folic acid 1 MG tablet Commonly known as: FOLVITE Take 1 tablet (1 mg total) by mouth daily.   ivabradine 5 MG Tabs tablet Commonly known as: CORLANOR Take 1 tablet (5 mg total) by mouth 2 (two) times daily with a meal.   Mag-200 200 MG Tabs Generic drug: Magnesium Oxide Take 200 mg by mouth 2 (two) times daily.   pantoprazole 40 MG tablet Commonly known as: PROTONIX Take 40 mg by mouth 2 (two) times daily. Take 1 tablet by mouth twice daily.  QUEtiapine 100 MG tablet Commonly known as: SEROQUEL Take 150 mg by mouth daily. Take one and one-half tablets by mouth daily as needed.   rivaroxaban 20 MG Tabs tablet Commonly known as: XARELTO Take 1 tablet (20 mg total) by mouth daily.   sacubitril-valsartan 49-51 MG Commonly known as: ENTRESTO Take 1 tablet by mouth 2 (two) times daily.   sertraline 25 MG tablet Commonly known as: Zoloft Take 1 tablet (25 mg total) by mouth daily.   spironolactone 25 MG tablet Commonly known as: ALDACTONE Take 1 tablet (25 mg total) by mouth daily.   thiamine 100 MG tablet Take 1 tablet (100 mg total) by mouth daily.   traZODone 50 MG tablet Commonly known as: DESYREL Take 1 tablet (50 mg total) by mouth at bedtime.      Allergies  Allergen Reactions  . Bactrim  [Sulfamethoxazole-Trimethoprim] Rash      The results of significant diagnostics from this hospitalization (including imaging, microbiology, ancillary and laboratory) are listed below for reference.    Significant Diagnostic Studies: DG Chest Port 1 View  Result Date: 03/06/2020 CLINICAL DATA:  Vomiting and chest pain EXAM: PORTABLE CHEST 1 VIEW COMPARISON:  01/09/2020 FINDINGS: The heart size and mediastinal contours are within normal limits. Both lungs are clear. The visualized skeletal structures are unremarkable. IMPRESSION: No active disease. Electronically Signed   By: Ulyses Jarred M.D.   On: 03/06/2020 03:40    Microbiology: Recent Results (from the past 240 hour(s))  Respiratory Panel by RT PCR (Flu A&B, Covid) - Nasopharyngeal Swab     Status: None   Collection Time: 03/06/20  3:16 AM   Specimen: Nasopharyngeal Swab  Result Value Ref Range Status   SARS Coronavirus 2 by RT PCR NEGATIVE NEGATIVE Final    Comment: (NOTE) SARS-CoV-2 target nucleic acids are NOT DETECTED.  The SARS-CoV-2 RNA is generally detectable in upper respiratoy specimens during the acute phase of infection. The lowest concentration of SARS-CoV-2 viral copies this assay can detect is 131 copies/mL. A negative result does not preclude SARS-Cov-2 infection and should not be used as the sole basis for treatment or other patient management decisions. A negative result may occur with  improper specimen collection/handling, submission of specimen other than nasopharyngeal swab, presence of viral mutation(s) within the areas targeted by this assay, and inadequate number of viral copies (<131 copies/mL). A negative result must be combined with clinical observations, patient history, and epidemiological information. The expected result is Negative.  Fact Sheet for Patients:  PinkCheek.be  Fact Sheet for Healthcare Providers:  GravelBags.it  This  test is no t yet approved or cleared by the [place] FDA and  has been authorized for detection and/or diagnosis of SARS-CoV-2 by FDA under an Emergency Use Authorization (EUA). This EUA will remain  in effect (meaning this test can be used) for the duration of the COVID-19 declaration under Section 564(b)(1) of the Act, 21 U.S.C. section 360bbb-3(b)(1), unless the authorization is terminated or revoked sooner.     Influenza A by PCR NEGATIVE NEGATIVE Final   Influenza B by PCR NEGATIVE NEGATIVE Final    Comment: (NOTE) The Xpert Xpress SARS-CoV-2/FLU/RSV assay is intended as an aid in  the diagnosis of influenza from Nasopharyngeal swab specimens and  should not be used as a sole basis for treatment. Nasal washings and  aspirates are unacceptable for Xpert Xpress SARS-CoV-2/FLU/RSV  testing.  Fact Sheet for Patients: PinkCheek.be  Fact Sheet for Healthcare Providers: GravelBags.it  This test is not  yet approved or cleared by the [place] and  has been authorized for detection and/or diagnosis of SARS-CoV-2 by  FDA under an Emergency Use Authorization (EUA). This EUA will remain  in effect (meaning this test can be used) for the duration of the  Covid-19 declaration under Section 564(b)(1) of the Act, 21  U.S.C. section 360bbb-3(b)(1), unless the authorization is  terminated or revoked. Performed at [facility], [place] [address] [place], [place] [address]      Labs: Basic Metabolic Panel: Recent Labs  Lab 03/06/20 0314 03/07/20 0358 03/08/20 0748  NA 136 136 137  K 3.9 3.1* 4.1  CL 95* 96* 100  CO2 20* 26 26  GLUCOSE 175* 98 101*  BUN 27* 42* 37*  CREATININE 2.18* 2.37* 1.47*  CALCIUM 9.1 9.2 9.3   Liver Function Tests: Recent Labs  Lab 03/06/20 0314 03/07/20 0358 03/08/20 0748  AST 45* 59* 71*  ALT 52* 57* 82*  ALKPHOS 51 54 47  BILITOT 1.8* 1.1 0.8  PROT 8.3* 8.1  7.4  ALBUMIN 4.8 4.6 4.3   Recent Labs  Lab 03/06/20 0314  LIPASE 36   No results for input(s): AMMONIA in the last 168 hours. CBC: Recent Labs  Lab 03/06/20 0314 03/06/20 0630 03/06/20 1014 03/06/20 1402 03/07/20 0358  WBC 7.9 8.4 7.6 6.7 5.7  NEUTROABS 6.6  --   --   --  3.6  HGB 12.7* 11.9* 12.1* 12.3* 12.5*  HCT 38.1* 34.9* 35.2* 37.8* 38.1*  MCV 89.6 88.1 87.3 89.2 89.9  PLT 185 159 159 174 156   Cardiac Enzymes: No results for input(s): CKTOTAL, CKMB, CKMBINDEX, TROPONINI in the last 168 hours. BNP: BNP (last 3 results) Recent Labs    11/21/19 1522 12/09/19 1004 12/30/19 1016  BNP 451.3* 434.6* 262.2*    ProBNP (last 3 results) No results for input(s): PROBNP in the last 8760 hours.  CBG: Recent Labs  Lab 03/07/20 1150 03/07/20 1738 03/07/20 2312 03/08/20 0526 03/08/20 1248  GLUCAP 111* 192* 114* 104* 167*       Signed:  Kayleen Memos, MD [facility] 03/08/2020, 1:49 PM

## 2020-03-08 NOTE — Discharge Instructions (Signed)
Alcohol Withdrawal Syndrome When a person who drinks a lot of alcohol stops drinking, he or she may have unpleasant and serious symptoms. These symptoms are called alcohol withdrawal syndrome. This condition may be mild or severe. It can be life-threatening. It can cause:  Shaking that you cannot control (tremor).  Sweating.  Headache.  Feeling fearful, upset, grouchy, or depressed.  Trouble sleeping (insomnia).  Nightmares.  Fast or uneven heartbeats (palpitations).  Alcohol cravings.  Feeling sick to your stomach (nausea).  Throwing up (vomiting).  Being bothered by light and sounds.  Confusion.  Trouble thinking clearly.  Not being hungry (loss of appetite).  Big changes in mood (mood swings). If you have all of the following symptoms at the same time, get help right away:  High blood pressure.  Fast heartbeat.  Trouble breathing.  Seizures.  Seeing, hearing, feeling, smelling, or tasting things that are not there (hallucinations). These symptoms are known as delirium tremens (DTs). They must be treated at the hospital right away. Follow these instructions at home:   Take over-the-counter and prescription medicines only as told by your doctor. This includes vitamins.  Do not drink alcohol.  Do not drive until your doctor says that this is safe for you.  Have someone stay with you or be available in case you need help. This should be someone you trust. This person can help you with your symptoms. He or she can also help you to not drink.  Drink enough fluid to keep your pee (urine) pale yellow.  Think about joining a support group or a treatment program to help you stop drinking.  Keep all follow-up visits as told by your doctor. This is important. Contact a doctor if:  Your symptoms get worse.  You cannot eat or drink without throwing up.  You have a hard time not drinking alcohol.  You cannot stop drinking alcohol. Get help right away  if:  You have fast or uneven heartbeats.  You have chest pain.  You have trouble breathing.  You have a seizure for the first time.  You see, hear, feel, smell, or taste something that is not there.  You get very confused. Summary  When a person who drinks a lot of alcohol stops drinking, he or she may have serious symptoms. This is called alcohol withdrawal syndrome.  Delirium tremens (DTs) is a group of life-threatening symptoms. You should get help right away if you have these symptoms.  Think about joining an alcohol support group or a treatment program. This information is not intended to replace advice given to you by your health care provider. Make sure you discuss any questions you have with your health care provider. Document Revised: 04/11/2017 Document Reviewed: 01/03/2017 Elsevier Patient Education  [place].

## 2020-03-08 NOTE — Plan of Care (Signed)
  Problem: Education: Goal: Knowledge of disease or condition will improve Outcome: Adequate for Discharge   Problem: Safety: Goal: Ability to remain free from injury will improve Outcome: Adequate for Discharge   Problem: Education: Goal: Knowledge of disease or condition will improve Outcome: Adequate for Discharge

## 2020-03-09 NOTE — Progress Notes (Signed)
ADVANCED HF CLINIC NOTE  PCP: Primary Cardiologist: Dr Robert [name]    Reason for F/u: F/u for Chronic Systolic Heart Failure   HPI:  Robert [name] is a 39 y/o male with h/o heavy ETOH and cocaine use, PE,  biventricular biventricular systolic heart failure.  Admitted with acute onset HF with severe biventricular failure in 5/21. EF 15%. RV down. He was drinking at least 2 beers every night and about 12-24 over the weekend. Also drinking 1/5 of liqour every other day. Cath shown normal coronaries. Elevated filling pressures with normal output. Placed on milrinone and diuresed with IV lasix. As he improved milrinone was weaned off . Transitioned oral lasix. 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 CT shows small LLL PE. Placed on xarelto. Started on HF meds.  Discharge weight 178 pounds.   Readmitted by [facility] and discharged 11/02/19. Admitted for mild acute blood loss related anemia. EGD showedno active bleeding but evidence of some gastritis.  He was switched to oral PPI twice daily, H&H remained stable and he did not require any transfusions.  He was instructed to continue Xarelto.   Has been followed by Paramedicine and HF PharmD with med titration.   Continues to have very frequent ED visits. Admitted 10/25-10/28/21 for ETOH withdrawal symptoms and AKI.   Today he returns for HF follow up. Here with Paramedic Robert [name]. Says he is living with his mom. Has not had a drink in 6 days. Taking most meds. Mild DOE. No orthopnea or PND. Wants to go back to work.  Echo today (03/10/20) EF 30-35% RV normal. No effusion. Personally reviewed REDS 39%  Cardiac studies. ECHO 10/12/2019 Pericardial effusion remains moderate in size. No tamponade physiology. ECHO 10/05/2019 EF <20%  Prominent trabeculation. Grade III DD  [facility] 10/05/2019 showed normal coronaries, elevated filling pressures with normal output.  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  SH:  Social History   Socioeconomic History  . Marital status: Single    Spouse name: Not on file  . Number of children: Not on file  . Years of education: Not on file  . Highest education level: Not on file  Occupational History  . Not on file  Tobacco Use  . Smoking status: Current Every Day Smoker    Packs/day: 0.50    Types: Cigarettes  . Smokeless tobacco: Never Used  Vaping Use  . Vaping Use: Former  Substance and Sexual Activity  . Alcohol use: Yes  . Drug use: Yes    Types: Marijuana  . Sexual activity: Not on file  Other Topics Concern  . Not on file  Social History Narrative  . Not on file   Social Determinants of Health   Financial Resource Strain: High Risk  . Difficulty of Paying Living Expenses: Very hard  Food Insecurity: Food Insecurity Present  . Worried About Charity fundraiser in the Last Year: Sometimes true  . Ran Out of Food in the Last Year: Sometimes true  Transportation Needs:   . Lack of Transportation (Medical): Not on file  . Lack of Transportation (Non-Medical): Not on file  Physical Activity:   . Days of Exercise per Week: Not on file  . Minutes of Exercise per Session: Not on file  Stress:   . Feeling of Stress : Not on file  Social Connections:   . Frequency of Communication with Friends and  Family: Not on file  . Frequency of Social Gatherings with Friends and Family: Not on file  . Attends Religious Services: Not on file  . Active Member of Clubs or Organizations: Not on file  . Attends Archivist Meetings: Not on file  . Marital Status: Not on file  Intimate Partner Violence:   . Fear of Current or Ex-Partner: Not on file  . Emotionally Abused: Not on file  . Physically Abused: Not on file  . Sexually Abused: Not on file    FH:  Family History  Problem Relation Age of Onset  . Heart failure Father   . Diabetes Father   . Kidney disease Father      Past Medical History:  Diagnosis Date  . Alcohol use   . Asthma   . CHF (congestive heart failure) (HCC)     Current Outpatient Medications  Medication Sig Dispense Refill  . dapagliflozin propanediol (FARXIGA) 10 MG TABS tablet Take 1 tablet (10 mg total) by mouth daily before breakfast. 30 tablet 6  . digoxin (LANOXIN) 0.125 MG tablet Take 1 tablet (0.125 mg total) by mouth daily. 30 tablet 5  . famotidine (PEPCID) 20 MG tablet Take 1 tablet (20 mg total) by mouth 2 (two) times daily. 10 tablet 0  . folic acid (FOLVITE) 1 MG tablet Take 1 tablet (1 mg total) by mouth daily. 30 tablet 3  . ivabradine (CORLANOR) 5 MG TABS tablet Take 1 tablet (5 mg total) by mouth 2 (two) times daily with a meal. 60 tablet 5  . Magnesium Oxide (MAG-200) 200 MG TABS Take 200 mg by mouth 2 (two) times daily.    . pantoprazole (PROTONIX) 40 MG tablet Take 40 mg by mouth 2 (two) times daily. Take 1 tablet by mouth twice daily.    . QUEtiapine (SEROQUEL) 100 MG tablet Take 150 mg by mouth daily. Take one and one-half tablets by mouth daily as needed.    . rivaroxaban (XARELTO) 20 MG TABS tablet Take 1 tablet (20 mg total) by mouth daily. 30 tablet 0  . sacubitril-valsartan (ENTRESTO) 49-51 MG Take 1 tablet by mouth 2 (two) times daily. 60 tablet 11  . sertraline (ZOLOFT) 25 MG tablet Take 1 tablet (25 mg total) by mouth daily. 30 tablet 2  . spironolactone (ALDACTONE) 25 MG tablet Take 1 tablet (25 mg total) by mouth daily. 90 tablet 3  . thiamine 100 MG tablet Take 1 tablet (100 mg total) by mouth daily. 30 tablet 0  . traZODone (DESYREL) 50 MG tablet Take 1 tablet (50 mg total) by mouth at bedtime. 60 tablet 0   No current facility-administered medications for this encounter.   Wt Readings from Last 3 Encounters:  03/06/20 90.7 kg (200 lb)  02/24/20 90.7 kg (200 lb)  01/21/20 87.1 kg (192 lb)    Vitals:   03/10/20 1109  BP: 130/83  Pulse: 95  SpO2: 99%  Weight: 94.7 kg (208 lb 12.8 oz)    General:  Well appearing. No resp difficulty HEENT: normal Neck: supple. JVP 7 Carotids 2+ bilat; no bruits. No lymphadenopathy or thryomegaly appreciated. Cor: PMI nondisplaced. Regular rate mildly tachy  No rubs, gallops or murmurs. Lungs: clear Abdomen: soft, nontender, nondistended. No hepatosplenomegaly. No bruits or masses. Good bowel sounds. Extremities: no cyanosis, clubbing, rash, edema Neuro: alert & orientedx3, cranial nerves grossly intact. moves all 4 extremities w/o difficulty. Affect pleasant   ASSESSMENT & PLAN: 1.Chronic Systolic Heart Failure, NICM  - ECHO 5/21  w/ severely reduced EF 15% RV moderately reduced. Suspect ETOH and cocaine playing a role.  - LHC with no coronary disease. 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 Pending referral for genetic testing - Echo today 03/10/20 (much improved) EF 30-35% RV normal. No effusion. Personally reviewed - REDS 39% - Add lasix 40 daily with kcl 20 daily. Check labs 2 weeks  - Continue spiro 12.5 mg bid  - Continue digoxin 0.125 mg daily  - Continue Entresto 49/51 bid - Increase Corlanor to 7.5 mg bid.  - Continue Farxiga 10  - no  blocker with low output - not a candidate for advanced therapies w/ polysubstance abuse  - Again discussed importance of abstinence from ETOH and tobacco  - Continue Paramedicine - Will see back 1 month. Continue to titrate HF meds - Not ICD candidate with ongoing substance abuse  2.Pulmonary Emboli - CTA w/ small LLL PE 5/21 - LE Venous dopplers negative for DVT  - Continue Xarelto. No bleeding  3. 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 Recently had detox at [facility] but started drinking again.  - SW following - WE discussed abstinence again  5. H/O Cocaine Abuse - No use since discharge.  - most recent UDS 10/31/19 negative   6. Pericardial Effusion - moderate  circumferential effusion on cMRI. - resolved on echo today  7. Tobacco Abuse - Discussed need for cessation  Total time spent 45 minutes. Over half that time spent discussing above.    Robert Bickers MD  9:26 PM

## 2020-03-10 ENCOUNTER — Ambulatory Visit (HOSPITAL_COMMUNITY): Admission: RE | Admit: 2020-03-10 | Payer: BC Managed Care – PPO | Source: Ambulatory Visit

## 2020-03-10 ENCOUNTER — Ambulatory Visit (HOSPITAL_BASED_OUTPATIENT_CLINIC_OR_DEPARTMENT_OTHER)
Admission: RE | Admit: 2020-03-10 | Discharge: 2020-03-10 | Disposition: A | Discharge status: Home / Self Care | Payer: BC Managed Care – PPO | Source: Ambulatory Visit

## 2020-03-10 ENCOUNTER — Other Ambulatory Visit: Payer: Self-pay

## 2020-03-10 ENCOUNTER — Ambulatory Visit (HOSPITAL_COMMUNITY)
Admission: RE | Admit: 2020-03-10 | Discharge: 2020-03-10 | Disposition: A | Discharge status: Home / Self Care | Payer: BC Managed Care – PPO | Source: Ambulatory Visit | Attending: Internal Medicine | Admitting: Internal Medicine

## 2020-03-10 VITALS — BP 130/83 | HR 95 | Wt 208.8 lb

## 2020-03-10 DIAGNOSIS — I2699 Other pulmonary embolism without acute cor pulmonale: Secondary | ICD-10-CM | POA: Insufficient documentation

## 2020-03-10 DIAGNOSIS — Z7984 Long term (current) use of oral hypoglycemic drugs: Secondary | ICD-10-CM | POA: Insufficient documentation

## 2020-03-10 DIAGNOSIS — I428 Other cardiomyopathies: Secondary | ICD-10-CM | POA: Insufficient documentation

## 2020-03-10 DIAGNOSIS — Z79899 Other long term (current) drug therapy: Secondary | ICD-10-CM | POA: Insufficient documentation

## 2020-03-10 DIAGNOSIS — I313 Pericardial effusion (noninflammatory): Secondary | ICD-10-CM | POA: Diagnosis not present

## 2020-03-10 DIAGNOSIS — F1410 Cocaine abuse, uncomplicated: Secondary | ICD-10-CM | POA: Diagnosis not present

## 2020-03-10 DIAGNOSIS — F1010 Alcohol abuse, uncomplicated: Secondary | ICD-10-CM | POA: Diagnosis not present

## 2020-03-10 DIAGNOSIS — Z720 Tobacco use: Secondary | ICD-10-CM | POA: Diagnosis not present

## 2020-03-10 DIAGNOSIS — F10129 Alcohol abuse with intoxication, unspecified: Secondary | ICD-10-CM | POA: Insufficient documentation

## 2020-03-10 DIAGNOSIS — Z7901 Long term (current) use of anticoagulants: Secondary | ICD-10-CM | POA: Diagnosis not present

## 2020-03-10 DIAGNOSIS — I5022 Chronic systolic (congestive) heart failure: Secondary | ICD-10-CM

## 2020-03-10 DIAGNOSIS — I358 Other nonrheumatic aortic valve disorders: Secondary | ICD-10-CM | POA: Diagnosis not present

## 2020-03-10 DIAGNOSIS — I2782 Chronic pulmonary embolism: Secondary | ICD-10-CM

## 2020-03-10 LAB — ECHOCARDIOGRAM COMPLETE
Area-P 1/2: 7.44 cm2
Calc EF: 38.8 %
S' Lateral: 5 cm
Single Plane A2C EF: 42.7 %
Single Plane A4C EF: 34.5 %
Weight: 3340.8 oz

## 2020-03-10 MED ORDER — IVABRADINE HCL 5 MG PO TABS
7.5000 mg | ORAL_TABLET | Freq: Two times a day (BID) | ORAL | 5 refills | Status: DC
Start: 2020-03-10 — End: 2020-03-27

## 2020-03-10 MED ORDER — FUROSEMIDE 40 MG PO TABS: 40.0000 mg | ORAL_TABLET | Freq: Every day | ORAL | 3 refills | Status: DC

## 2020-03-10 MED ORDER — POTASSIUM CHLORIDE CRYS ER 20 MEQ PO TBCR: 20.0000 meq | EXTENDED_RELEASE_TABLET | Freq: Every day | ORAL | 3 refills | Status: DC

## 2020-03-10 NOTE — Progress Notes (Signed)
ReDS Vest / Clip - 03/10/20 1200      ReDS Vest / Clip   Station Marker D    Ruler Value 34    ReDS Value Range Moderate volume overload    ReDS Actual Value 39    Anatomical Comments sitting

## 2020-03-10 NOTE — Progress Notes (Signed)
  Echocardiogram 2D Echocardiogram has been performed.  Bobbye Charleston 03/10/2020, 12:45 PM

## 2020-03-10 NOTE — Patient Instructions (Addendum)
Increase Corlanor to 7.5 mg (1 1/2 tabs) 2 times a day.  START Lasix 40mg  (1 tablet) Daily  START Potassium 60meq (1 tablet) daily  Your physician recommends that you return for lab work in 2 weeks  Your physician recommends that you return for a follow up appointment with the APP clinic in 1 month  You can return to work. Rest breaks as needed.  If you have any questions or concerns before your next appointment please send [place] a message through [place] or call our office at [phone].    TO LEAVE A MESSAGE FOR THE NURSE SELECT OPTION 2, PLEASE LEAVE A MESSAGE INCLUDING: . YOUR NAME . DATE OF BIRTH . CALL BACK NUMBER . REASON FOR CALL**this is important as we prioritize the call backs  YOU WILL RECEIVE A CALL BACK THE SAME DAY AS LONG AS YOU CALL BEFORE 4:00 PM

## 2020-03-13 ENCOUNTER — Encounter: Payer: Self-pay | Admitting: Family Medicine

## 2020-03-13 ENCOUNTER — Telehealth: Payer: Self-pay | Admitting: Family Medicine

## 2020-03-13 NOTE — Telephone Encounter (Addendum)
Pt was no show for appt 02/28/20 new patient appt. 1st occurrence. Fee waived. Letter mailed.

## 2020-03-15 ENCOUNTER — Other Ambulatory Visit (HOSPITAL_COMMUNITY): Payer: BC Managed Care – PPO

## 2020-03-16 ENCOUNTER — Other Ambulatory Visit: Payer: Self-pay | Admitting: Cardiology

## 2020-03-16 ENCOUNTER — Ambulatory Visit (HOSPITAL_COMMUNITY)
Admission: RE | Admit: 2020-03-16 | Discharge: 2020-03-16 | Disposition: A | Discharge status: Home / Self Care | Payer: BC Managed Care – PPO | Source: Ambulatory Visit | Attending: Internal Medicine | Admitting: Internal Medicine

## 2020-03-16 ENCOUNTER — Other Ambulatory Visit (HOSPITAL_COMMUNITY): Payer: Self-pay | Admitting: *Deleted

## 2020-03-16 ENCOUNTER — Other Ambulatory Visit (HOSPITAL_COMMUNITY): Payer: Self-pay

## 2020-03-16 ENCOUNTER — Telehealth (HOSPITAL_COMMUNITY): Payer: Self-pay | Admitting: *Deleted

## 2020-03-16 DIAGNOSIS — I5022 Chronic systolic (congestive) heart failure: Secondary | ICD-10-CM

## 2020-03-16 LAB — BASIC METABOLIC PANEL
Anion gap: 11 (ref 5–15)
BUN: 28 mg/dL — ABNORMAL HIGH (ref 6–20)
CO2: 27 mmol/L (ref 22–32)
Calcium: 9.3 mg/dL (ref 8.9–10.3)
Chloride: 104 mmol/L (ref 98–111)
Creatinine, Ser: 1.54 mg/dL — ABNORMAL HIGH (ref 0.61–1.24)
GFR, Estimated: 59 mL/min — ABNORMAL LOW (ref >60)
Glucose, Bld: 100 mg/dL — ABNORMAL HIGH (ref 70–99)
Potassium: 4.3 mmol/L (ref 3.5–5.1)
Sodium: 142 mmol/L (ref 135–145)

## 2020-03-16 MED ORDER — MAG-200 200 MG PO TABS
200.0000 mg | ORAL_TABLET | Freq: Two times a day (BID) | ORAL | 3 refills | Status: DC
Start: 2020-03-16 — End: 2020-07-10

## 2020-03-16 MED ORDER — FOLIC ACID 1 MG PO TABS
1.0000 mg | ORAL_TABLET | Freq: Every day | ORAL | 3 refills | Status: DC
Start: 2020-03-16 — End: 2020-11-21

## 2020-03-16 NOTE — Telephone Encounter (Signed)
Robert [name] with paramedicine called to report pts weight is up 4lbs since his last office visit 10/29. Pt has very noticeable BLEE. Pt decided to increase his lasix to 80mg  x3days on his own but it has not helped.  Pt has not started his potassium because he never picked it up from the pharmacy.  Per Dr.Bensimhon give 80mg  IV lasix  x3days give 40 meq of potassium today and check bmet. Robert [name] aware and agreeable with plan.

## 2020-03-16 NOTE — Progress Notes (Signed)
Paramedicine Encounter    Patient ID: Robert [name], male    DOB: 1980-06-28, 39 y.o.   MRN: [id number]   Patient Care Team: Patient, No Pcp Per as PCP - General ([place]) Skeet Latch, MD as PCP - Cardiology (Cardiology)  Patient Active Problem List   Diagnosis Date Noted  . Alcohol withdrawal ([place]) 03/06/2020  . Nausea & vomiting 03/06/2020  . AKI (acute kidney injury) ([place]) 10/30/2019  . Gastritis 10/30/2019  . Elevated INR 10/30/2019  . 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[place])   . Acute heart failure ([place]) 10/05/2019  . Pulmonary embolism ([place]) 10/04/2019  . Acute pulmonary embolism ([place]) 10/04/2019  . Chronic systolic CHF (congestive heart failure) ([place])   . Inappropriate sinus tachycardia   . Chest pain 11/03/2013  . Tobacco abuse 11/03/2013    Current Outpatient Medications:  .  dapagliflozin propanediol (FARXIGA) 10 MG TABS tablet, Take 1 tablet (10 mg total) by mouth daily before breakfast., Disp: 30 tablet, Rfl: 6 .  digoxin (LANOXIN) 0.125 MG tablet, Take 1 tablet (0.125 mg total) by mouth daily., Disp: 30 tablet, Rfl: 5 .  furosemide (LASIX) 40 MG tablet, Take 1 tablet (40 mg total) by mouth daily., Disp: 30 tablet, Rfl: 3 .  ivabradine (CORLANOR) 5 MG TABS tablet, Take 1.5 tablets (7.5 mg total) by mouth 2 (two) times daily with a meal., Disp: 90 tablet, Rfl: 5 .  Magnesium Oxide (MAG-200) 200 MG TABS, Take 1 tablet (200 mg total) by mouth 2 (two) times daily., Disp: 60 tablet, Rfl: 3 .  pantoprazole (PROTONIX) 40 MG tablet, Take 40 mg by mouth 2 (two) times daily. Take 1 tablet by mouth twice daily., Disp: , Rfl:  .  QUEtiapine (SEROQUEL) 100 MG tablet, Take 150 mg by mouth daily. Take one and one-half tablets by mouth daily as needed., Disp: , Rfl:  .  rivaroxaban (XARELTO) 20 MG TABS tablet, Take 1 tablet (20 mg total) by mouth daily., Disp: 30 tablet, Rfl: 0 .  sacubitril-valsartan (ENTRESTO) 49-51 MG, Take 1 tablet by mouth  2 (two) times daily., Disp: 60 tablet, Rfl: 11 .  sertraline (ZOLOFT) 25 MG tablet, Take 1 tablet (25 mg total) by mouth daily., Disp: 30 tablet, Rfl: 2 .  spironolactone (ALDACTONE) 25 MG tablet, Take 1 tablet (25 mg total) by mouth daily., Disp: 90 tablet, Rfl: 3 .  thiamine 100 MG tablet, Take 1 tablet (100 mg total) by mouth daily., Disp: 30 tablet, Rfl: 0 .  traZODone (DESYREL) 50 MG tablet, Take 1 tablet (50 mg total) by mouth at bedtime., Disp: 60 tablet, Rfl: 0 .  folic acid (FOLVITE) 1 MG tablet, Take 1 tablet (1 mg total) by mouth daily., Disp: 30 tablet, Rfl: 3 .  potassium chloride SA (KLOR-CON) 20 MEQ tablet, Take 1 tablet (20 mEq total) by mouth daily., Disp: 30 tablet, Rfl: 3 Allergies  Allergen Reactions  . Bactrim [Sulfamethoxazole-Trimethoprim] Rash      Social History   Socioeconomic History  . Marital status: Single    Spouse name: Not on file  . Number of children: Not on file  . Years of education: Not on file  . Highest education level: Not on file  Occupational History  . Not on file  Tobacco Use  . Smoking status: Current Every Day Smoker    Packs/day: 0.50    Types: Cigarettes  . Smokeless tobacco: Never Used  Vaping Use  . Vaping Use: Former  Substance and Sexual Activity  .  Alcohol use: Yes  . Drug use: Yes    Types: Marijuana  . Sexual activity: Not on file  Other Topics Concern  . Not on file  Social History Narrative  . Not on file   Social Determinants of Health   Financial Resource Strain: High Risk  . Difficulty of Paying Living Expenses: Very hard  Food Insecurity: Food Insecurity Present  . Worried About Charity fundraiser in the Last Year: Sometimes true  . Ran Out of Food in the Last Year: Sometimes true  Transportation Needs:   . Lack of Transportation (Medical): Not on file  . Lack of Transportation (Non-Medical): Not on file  Physical Activity:   . Days of Exercise per Week: Not on file  . Minutes of Exercise per Session:  Not on file  Stress:   . Feeling of Stress : Not on file  Social Connections:   . Frequency of Communication with Friends and Family: Not on file  . Frequency of Social Gatherings with Friends and Family: Not on file  . Attends Religious Services: Not on file  . Active Member of Clubs or Organizations: Not on file  . Attends Archivist Meetings: Not on file  . Marital Status: Not on file  Intimate Partner Violence:   . Fear of Current or Ex-Partner: Not on file  . Emotionally Abused: Not on file  . Physically Abused: Not on file  . Sexually Abused: Not on file    Physical Exam Cardiovascular:     Rate and Rhythm: Normal rate and regular rhythm.     Pulses: Normal pulses.  Pulmonary:     Effort: Pulmonary effort is normal.     Breath sounds: Normal breath sounds.  Musculoskeletal:        General: Normal range of motion.     Right lower leg: Edema present.     Left lower leg: Edema present.  Skin:    General: Skin is warm and dry.     Capillary Refill: Capillary refill takes less than 2 seconds.  Neurological:     Mental Status: He is alert and oriented to person, place, and time.  Psychiatric:        Mood and Affect: Mood normal.         Future Appointments  Date Time Provider [place]  03/24/2020  8:30 AM [facility] LAB [facility] None  04/11/2020  9:00 AM [facility] PA/NP [facility] None    BP 102/65 (BP Location: Right Arm, Patient Position: Sitting, Cuff Size: Normal)   Pulse 74   Wt 212 lb (96.2 kg)   SpO2 98%   BMI 30.42 kg/m   Weight yesterday- did not weigh  Last visit weight- 208 lb  [name] was seen at home today and reported feeling unwell. He complained of intermitted chest pain, exertional dyspnea, dizziness and headache since his appointment last week. He stated he has been taking his medications however three days ago he had an increase in his body weight and his legs began to swell. He told me that when this started he began taking 80  mg of lasix PO but is has not resolved the swelling. I contacted the HF clinic who advised to have him take 40 mEq of potassium, administer 80 mg of IV lasix and draw a BMP. Orders were carried out and his morning lasix was removed from his pillbox. After administration of the IV lasix he began urinating within 15-20 minutes. He described his urine as "mostly  clear but light gold." I will follow up next week unless it becomes necessary sooner.     [name], EMT 03/16/20  ACTION: Home visit completed Next visit planned for 1 week

## 2020-03-21 ENCOUNTER — Ambulatory Visit (HOSPITAL_COMMUNITY)
Admission: RE | Admit: 2020-03-21 | Discharge: 2020-03-21 | Disposition: A | Discharge status: Home / Self Care | Payer: BC Managed Care – PPO | Source: Ambulatory Visit | Attending: Internal Medicine | Admitting: Internal Medicine

## 2020-03-21 ENCOUNTER — Other Ambulatory Visit: Payer: Self-pay

## 2020-03-21 DIAGNOSIS — I5022 Chronic systolic (congestive) heart failure: Secondary | ICD-10-CM | POA: Diagnosis not present

## 2020-03-21 LAB — BASIC METABOLIC PANEL
Anion gap: 12 (ref 5–15)
BUN: 15 mg/dL (ref 6–20)
CO2: 24 mmol/L (ref 22–32)
Calcium: 9.2 mg/dL (ref 8.9–10.3)
Chloride: 104 mmol/L (ref 98–111)
Creatinine, Ser: 1.33 mg/dL — ABNORMAL HIGH (ref 0.61–1.24)
GFR, Estimated: >60 mL/min (ref >60)
Glucose, Bld: 90 mg/dL (ref 70–99)
Potassium: 4.1 mmol/L (ref 3.5–5.1)
Sodium: 140 mmol/L (ref 135–145)

## 2020-03-21 NOTE — Addendum Note (Signed)
Addended by: Scarlette Calico on: 03/21/2020 01:16 PM   Modules accepted: Orders

## 2020-03-23 ENCOUNTER — Emergency Department (HOSPITAL_COMMUNITY): Payer: BC Managed Care – PPO

## 2020-03-23 ENCOUNTER — Observation Stay (HOSPITAL_COMMUNITY)
Admission: EM | Admit: 2020-03-23 | Discharge: 2020-03-24 | Disposition: A | Discharge status: Home / Self Care | Payer: BC Managed Care – PPO | Attending: Internal Medicine | Admitting: Internal Medicine

## 2020-03-23 ENCOUNTER — Encounter (HOSPITAL_COMMUNITY): Payer: Self-pay | Admitting: Emergency Medicine

## 2020-03-23 DIAGNOSIS — J45909 Unspecified asthma, uncomplicated: Secondary | ICD-10-CM | POA: Diagnosis not present

## 2020-03-23 DIAGNOSIS — F1910 Other psychoactive substance abuse, uncomplicated: Secondary | ICD-10-CM | POA: Diagnosis not present

## 2020-03-23 DIAGNOSIS — N179 Acute kidney failure, unspecified: Principal | ICD-10-CM | POA: Diagnosis present

## 2020-03-23 DIAGNOSIS — E861 Hypovolemia: Secondary | ICD-10-CM

## 2020-03-23 DIAGNOSIS — E872 Acidosis, unspecified: Secondary | ICD-10-CM | POA: Diagnosis present

## 2020-03-23 DIAGNOSIS — I5022 Chronic systolic (congestive) heart failure: Secondary | ICD-10-CM | POA: Diagnosis present

## 2020-03-23 DIAGNOSIS — F17210 Nicotine dependence, cigarettes, uncomplicated: Secondary | ICD-10-CM | POA: Insufficient documentation

## 2020-03-23 DIAGNOSIS — F10229 Alcohol dependence with intoxication, unspecified: Secondary | ICD-10-CM | POA: Diagnosis present

## 2020-03-23 DIAGNOSIS — Z7901 Long term (current) use of anticoagulants: Secondary | ICD-10-CM | POA: Insufficient documentation

## 2020-03-23 DIAGNOSIS — I9589 Other hypotension: Secondary | ICD-10-CM

## 2020-03-23 DIAGNOSIS — I959 Hypotension, unspecified: Secondary | ICD-10-CM | POA: Diagnosis present

## 2020-03-23 DIAGNOSIS — E860 Dehydration: Secondary | ICD-10-CM

## 2020-03-23 DIAGNOSIS — F1010 Alcohol abuse, uncomplicated: Secondary | ICD-10-CM

## 2020-03-23 DIAGNOSIS — Z20822 Contact with and (suspected) exposure to covid-19: Secondary | ICD-10-CM | POA: Insufficient documentation

## 2020-03-23 DIAGNOSIS — R531 Weakness: Secondary | ICD-10-CM | POA: Diagnosis present

## 2020-03-23 DIAGNOSIS — I2699 Other pulmonary embolism without acute cor pulmonale: Secondary | ICD-10-CM | POA: Diagnosis present

## 2020-03-23 LAB — COMPREHENSIVE METABOLIC PANEL
ALT: 64 U/L — ABNORMAL HIGH (ref 0–44)
AST: 41 U/L (ref 15–41)
Albumin: 3.9 g/dL (ref 3.5–5.0)
Alkaline Phosphatase: 48 U/L (ref 38–126)
Anion gap: 16 — ABNORMAL HIGH (ref 5–15)
BUN: 13 mg/dL (ref 6–20)
CO2: 23 mmol/L (ref 22–32)
Calcium: 8.6 mg/dL — ABNORMAL LOW (ref 8.9–10.3)
Chloride: 101 mmol/L (ref 98–111)
Creatinine, Ser: 2.27 mg/dL — ABNORMAL HIGH (ref 0.61–1.24)
GFR, Estimated: 37 mL/min — ABNORMAL LOW (ref >60)
Glucose, Bld: 132 mg/dL — ABNORMAL HIGH (ref 70–99)
Potassium: 3.6 mmol/L (ref 3.5–5.1)
Sodium: 140 mmol/L (ref 135–145)
Total Bilirubin: 0.5 mg/dL (ref 0.3–1.2)
Total Protein: 6.8 g/dL (ref 6.5–8.1)

## 2020-03-23 LAB — RAPID URINE DRUG SCREEN, HOSP PERFORMED
Amphetamines: NOT DETECTED
Barbiturates: NOT DETECTED
Benzodiazepines: NOT DETECTED
Cocaine: NOT DETECTED
Opiates: NOT DETECTED
Tetrahydrocannabinol: POSITIVE — AB

## 2020-03-23 LAB — SAMPLE TO BLOOD BANK

## 2020-03-23 LAB — CBC WITH DIFFERENTIAL/PLATELET
Abs Immature Granulocytes: 0.06 10*3/uL (ref 0.00–0.07)
Basophils Absolute: 0.1 10*3/uL (ref 0.0–0.1)
Basophils Relative: 1 %
Eosinophils Absolute: 0.1 10*3/uL (ref 0.0–0.5)
Eosinophils Relative: 2 %
HCT: 36.3 % — ABNORMAL LOW (ref 39.0–52.0)
Hemoglobin: 11.4 g/dL — ABNORMAL LOW (ref 13.0–17.0)
Immature Granulocytes: 1 %
Lymphocytes Relative: 26 %
Lymphs Abs: 1.4 10*3/uL (ref 0.7–4.0)
MCH: 28.7 pg (ref 26.0–34.0)
MCHC: 31.4 g/dL (ref 30.0–36.0)
MCV: 91.4 fL (ref 80.0–100.0)
Monocytes Absolute: 0.6 10*3/uL (ref 0.1–1.0)
Monocytes Relative: 11 %
Neutro Abs: 3.1 10*3/uL (ref 1.7–7.7)
Neutrophils Relative %: 59 %
Platelets: 400 10*3/uL (ref 150–400)
RBC: 3.97 MIL/uL — ABNORMAL LOW (ref 4.22–5.81)
RDW: 13.3 % (ref 11.5–15.5)
WBC: 5.2 10*3/uL (ref 4.0–10.5)
nRBC: 0 % (ref 0.0–0.2)

## 2020-03-23 LAB — MAGNESIUM: Magnesium: 1.8 mg/dL (ref 1.7–2.4)

## 2020-03-23 LAB — TROPONIN I (HIGH SENSITIVITY)
Troponin I (High Sensitivity): 9 ng/L (ref <18)
Troponin I (High Sensitivity): 7 ng/L (ref <18)

## 2020-03-23 LAB — CBG MONITORING, ED: Glucose-Capillary: 100 mg/dL — ABNORMAL HIGH (ref 70–99)

## 2020-03-23 LAB — RESPIRATORY PANEL BY RT PCR (FLU A&B, COVID)
Influenza A by PCR: NEGATIVE
Influenza B by PCR: NEGATIVE
SARS Coronavirus 2 by RT PCR: NEGATIVE

## 2020-03-23 LAB — LACTIC ACID, PLASMA
Lactic Acid, Venous: 3.8 mmol/L (ref 0.5–1.9)
Lactic Acid, Venous: 2 mmol/L (ref 0.5–1.9)
Lactic Acid, Venous: 2.1 mmol/L (ref 0.5–1.9)
Lactic Acid, Venous: 1.6 mmol/L (ref 0.5–1.9)
Lactic Acid, Venous: 2 mmol/L (ref 0.5–1.9)

## 2020-03-23 LAB — BRAIN NATRIURETIC PEPTIDE: B Natriuretic Peptide: 87.4 pg/mL (ref 0.0–100.0)

## 2020-03-23 LAB — TSH: TSH: 0.934 u[IU]/mL (ref 0.350–4.500)

## 2020-03-23 LAB — DIGOXIN LEVEL: Digoxin Level: 0.4 ng/mL — ABNORMAL LOW (ref 1.0–2.0)

## 2020-03-23 LAB — AMMONIA: Ammonia: 16 umol/L (ref 9–35)

## 2020-03-23 LAB — ETHANOL: Alcohol, Ethyl (B): 143 mg/dL — ABNORMAL HIGH (ref <10)

## 2020-03-23 MED ORDER — INSULIN ASPART 100 UNIT/ML ~~LOC~~ SOLN: 0.0000 [IU] | Freq: Every day | SUBCUTANEOUS | Status: DC

## 2020-03-23 MED ORDER — FOLIC ACID 1 MG PO TABS
1.0000 mg | ORAL_TABLET | Freq: Every day | ORAL | Status: DC
  Administered 2020-03-23 – 2020-03-24 (×2): 1 mg via ORAL
  Filled 2020-03-23 (×2): qty 1

## 2020-03-23 MED ORDER — ALBUTEROL SULFATE (2.5 MG/3ML) 0.083% IN NEBU: 2.5000 mg | INHALATION_SOLUTION | RESPIRATORY_TRACT | Status: DC | PRN

## 2020-03-23 MED ORDER — THIAMINE HCL 100 MG/ML IJ SOLN: 100.0000 mg | Freq: Every day | INTRAMUSCULAR | Status: DC

## 2020-03-23 MED ORDER — ONDANSETRON HCL 4 MG/2ML IJ SOLN: 4.0000 mg | Freq: Four times a day (QID) | INTRAMUSCULAR | Status: DC | PRN

## 2020-03-23 MED ORDER — SODIUM CHLORIDE 0.9% FLUSH: 3.0000 mL | INTRAVENOUS | Status: DC | PRN

## 2020-03-23 MED ORDER — SODIUM CHLORIDE 0.9% FLUSH
3.0000 mL | Freq: Two times a day (BID) | INTRAVENOUS | Status: DC
  Administered 2020-03-23 – 2020-03-24 (×2): 3 mL via INTRAVENOUS

## 2020-03-23 MED ORDER — DIAZEPAM 5 MG PO TABS
5.0000 mg | ORAL_TABLET | Freq: Four times a day (QID) | ORAL | Status: DC
  Administered 2020-03-23 – 2020-03-24 (×4): 5 mg via ORAL
  Filled 2020-03-23 (×4): qty 1

## 2020-03-23 MED ORDER — LORAZEPAM 2 MG/ML IJ SOLN: 0.0000 mg | Freq: Four times a day (QID) | INTRAMUSCULAR | Status: DC

## 2020-03-23 MED ORDER — ADULT MULTIVITAMIN W/MINERALS CH
1.0000 | ORAL_TABLET | Freq: Every day | ORAL | Status: DC
  Administered 2020-03-23 – 2020-03-24 (×2): 1 via ORAL
  Filled 2020-03-23 (×2): qty 1

## 2020-03-23 MED ORDER — SODIUM CHLORIDE 0.9 % IV BOLUS (SEPSIS)
250.0000 mL | Freq: Once | INTRAVENOUS | Status: AC
  Administered 2020-03-23: 250 mL via INTRAVENOUS

## 2020-03-23 MED ORDER — LORAZEPAM 1 MG PO TABS
1.0000 mg | ORAL_TABLET | ORAL | Status: DC | PRN
  Administered 2020-03-23: 1 mg via ORAL
  Filled 2020-03-23: qty 1

## 2020-03-23 MED ORDER — SODIUM CHLORIDE 0.9 % IV BOLUS (SEPSIS): 500.0000 mL | Freq: Once | INTRAVENOUS | Status: DC

## 2020-03-23 MED ORDER — LORAZEPAM 2 MG/ML IJ SOLN: 0.0000 mg | Freq: Three times a day (TID) | INTRAMUSCULAR | Status: DC

## 2020-03-23 MED ORDER — MORPHINE SULFATE (PF) 2 MG/ML IV SOLN: 2.0000 mg | INTRAVENOUS | Status: DC | PRN

## 2020-03-23 MED ORDER — NICOTINE 14 MG/24HR TD PT24
14.0000 mg | MEDICATED_PATCH | Freq: Every day | TRANSDERMAL | Status: DC
  Administered 2020-03-23 – 2020-03-24 (×2): 14 mg via TRANSDERMAL
  Filled 2020-03-23 (×2): qty 1

## 2020-03-23 MED ORDER — LORAZEPAM 2 MG/ML IJ SOLN: 0.0000 mg | Freq: Two times a day (BID) | INTRAMUSCULAR | Status: DC

## 2020-03-23 MED ORDER — THIAMINE HCL 100 MG PO TABS
100.0000 mg | ORAL_TABLET | Freq: Every day | ORAL | Status: DC
  Administered 2020-03-23 – 2020-03-24 (×2): 100 mg via ORAL
  Filled 2020-03-23 (×2): qty 1

## 2020-03-23 MED ORDER — DIGOXIN 125 MCG PO TABS
0.1250 mg | ORAL_TABLET | Freq: Every day | ORAL | Status: DC
  Filled 2020-03-23: qty 1

## 2020-03-23 MED ORDER — SODIUM CHLORIDE 0.9 % IV SOLN: 250.0000 mL | INTRAVENOUS | Status: DC | PRN

## 2020-03-23 MED ORDER — LACTATED RINGERS IV SOLN: INTRAVENOUS | Status: DC

## 2020-03-23 MED ORDER — SODIUM CHLORIDE 0.9 % IV SOLN: INTRAVENOUS | Status: DC

## 2020-03-23 MED ORDER — TRAZODONE HCL 50 MG PO TABS
50.0000 mg | ORAL_TABLET | Freq: Every day | ORAL | Status: DC
  Administered 2020-03-23: 50 mg via ORAL
  Filled 2020-03-23: qty 1

## 2020-03-23 MED ORDER — DOCUSATE SODIUM 100 MG PO CAPS
100.0000 mg | ORAL_CAPSULE | Freq: Two times a day (BID) | ORAL | Status: DC
  Administered 2020-03-23 – 2020-03-24 (×3): 100 mg via ORAL
  Filled 2020-03-23 (×4): qty 1

## 2020-03-23 MED ORDER — ACETAMINOPHEN 325 MG PO TABS: 650.0000 mg | ORAL_TABLET | Freq: Four times a day (QID) | ORAL | Status: DC | PRN

## 2020-03-23 MED ORDER — HYDRALAZINE HCL 20 MG/ML IJ SOLN: 5.0000 mg | INTRAMUSCULAR | Status: DC | PRN

## 2020-03-23 MED ORDER — BISACODYL 5 MG PO TBEC: 5.0000 mg | DELAYED_RELEASE_TABLET | Freq: Every day | ORAL | Status: DC | PRN

## 2020-03-23 MED ORDER — POLYETHYLENE GLYCOL 3350 17 G PO PACK: 17.0000 g | PACK | Freq: Every day | ORAL | Status: DC | PRN

## 2020-03-23 MED ORDER — LORAZEPAM 2 MG/ML IJ SOLN
0.0000 mg | INTRAMUSCULAR | Status: DC
  Administered 2020-03-23: 1 mg via INTRAVENOUS
  Administered 2020-03-23 – 2020-03-24 (×3): 2 mg via INTRAVENOUS
  Filled 2020-03-23 (×3): qty 1

## 2020-03-23 MED ORDER — IVABRADINE HCL 7.5 MG PO TABS
7.5000 mg | ORAL_TABLET | Freq: Two times a day (BID) | ORAL | Status: DC
  Administered 2020-03-23 – 2020-03-24 (×2): 7.5 mg via ORAL
  Filled 2020-03-23 (×4): qty 1

## 2020-03-23 MED ORDER — LORAZEPAM 1 MG PO TABS: 0.0000 mg | ORAL_TABLET | Freq: Two times a day (BID) | ORAL | Status: DC

## 2020-03-23 MED ORDER — THIAMINE HCL 100 MG PO TABS: 100.0000 mg | ORAL_TABLET | Freq: Every day | ORAL | Status: DC

## 2020-03-23 MED ORDER — ACETAMINOPHEN 650 MG RE SUPP: 650.0000 mg | Freq: Four times a day (QID) | RECTAL | Status: DC | PRN

## 2020-03-23 MED ORDER — LORAZEPAM 2 MG/ML IJ SOLN
1.0000 mg | INTRAMUSCULAR | Status: DC | PRN
  Filled 2020-03-23: qty 1

## 2020-03-23 MED ORDER — LACTATED RINGERS IV BOLUS
500.0000 mL | Freq: Once | INTRAVENOUS | Status: AC
  Administered 2020-03-23: 500 mL via INTRAVENOUS

## 2020-03-23 MED ORDER — QUETIAPINE FUMARATE 50 MG PO TABS
150.0000 mg | ORAL_TABLET | Freq: Every day | ORAL | Status: DC
  Administered 2020-03-23 – 2020-03-24 (×2): 150 mg via ORAL
  Filled 2020-03-23 (×2): qty 1

## 2020-03-23 MED ORDER — ONDANSETRON HCL 4 MG PO TABS: 4.0000 mg | ORAL_TABLET | Freq: Four times a day (QID) | ORAL | Status: DC | PRN

## 2020-03-23 MED ORDER — SODIUM CHLORIDE 0.9 % IV SOLN: Freq: Once | INTRAVENOUS | Status: AC

## 2020-03-23 MED ORDER — HYDROCODONE-ACETAMINOPHEN 5-325 MG PO TABS: 1.0000 | ORAL_TABLET | ORAL | Status: DC | PRN

## 2020-03-23 MED ORDER — SERTRALINE HCL 50 MG PO TABS
25.0000 mg | ORAL_TABLET | Freq: Every day | ORAL | Status: DC
  Administered 2020-03-23 – 2020-03-24 (×2): 25 mg via ORAL
  Filled 2020-03-23 (×3): qty 1

## 2020-03-23 MED ORDER — RIVAROXABAN 20 MG PO TABS
20.0000 mg | ORAL_TABLET | Freq: Every day | ORAL | Status: DC
  Administered 2020-03-23 – 2020-03-24 (×2): 20 mg via ORAL
  Filled 2020-03-23 (×2): qty 1

## 2020-03-23 MED ORDER — LORAZEPAM 1 MG PO TABS: 0.0000 mg | ORAL_TABLET | Freq: Four times a day (QID) | ORAL | Status: DC

## 2020-03-23 MED ORDER — INSULIN ASPART 100 UNIT/ML ~~LOC~~ SOLN: 0.0000 [IU] | Freq: Three times a day (TID) | SUBCUTANEOUS | Status: DC

## 2020-03-23 NOTE — ED Provider Notes (Signed)
TIME SEEN: 5:18 AM  CHIEF COMPLAINT: Generalized weakness, hypotension  HPI: Patient is a 39 year old male with history of alcohol abuse, CHF with last EF of 30 to 35% who presents to the emergency department with 1 day of generalized weakness, altered mental status.  Mother at bedside reports he was doing well yesterday and went to work at [employer] normally.  Today he has had a hard time getting up out of bed due to weakness.  Was hypotensive in the 16X systolic with EMS and had a heart rate in the 40s to 50s.  He denies any fevers, cough, chest pain, abdominal pain, nausea, vomiting, diarrhea, bloody stools, melena, numbness or tingling.  He reports taking his medications but mother states he has not been.  He reports last alcoholic beverage was 2 days ago.  Denies drug use.  He has not been vaccinated for COVID-19.  ROS: See HPI Constitutional: no fever  Eyes: no drainage  ENT: no runny nose   Cardiovascular:  no chest pain  Resp: no SOB  GI: no vomiting GU: no dysuria Integumentary: no rash  Allergy: no hives  Musculoskeletal: no leg swelling  Neurological: no slurred speech ROS otherwise negative  PAST MEDICAL HISTORY/PAST SURGICAL HISTORY:  Past Medical History:  Diagnosis Date  . Alcohol use   . Asthma   . CHF (congestive heart failure) (HCC)     MEDICATIONS:  Prior to Admission medications   Medication Sig Start Date End Date Taking? Authorizing Provider  dapagliflozin propanediol (FARXIGA) 10 MG TABS tablet Take 1 tablet (10 mg total) by mouth daily before breakfast. 12/09/19   Bensimhon, Shaune Pascal, MD  digoxin (LANOXIN) 0.125 MG tablet Take 1 tablet (0.125 mg total) by mouth daily. 10/14/19   Lyda Jester M, PA-C  folic acid (FOLVITE) 1 MG tablet Take 1 tablet (1 mg total) by mouth daily. 03/16/20   Bensimhon, Shaune Pascal, MD  furosemide (LASIX) 40 MG tablet Take 1 tablet (40 mg total) by mouth daily. 03/10/20   Bensimhon, Shaune Pascal, MD  ivabradine (CORLANOR) 5 MG TABS tablet  Take 1.5 tablets (7.5 mg total) by mouth 2 (two) times daily with a meal. 03/10/20   Bensimhon, Shaune Pascal, MD  Magnesium Oxide (MAG-200) 200 MG TABS Take 1 tablet (200 mg total) by mouth 2 (two) times daily. 03/16/20   Bensimhon, Shaune Pascal, MD  pantoprazole (PROTONIX) 40 MG tablet Take 40 mg by mouth 2 (two) times daily. Take 1 tablet by mouth twice daily.    [provider]  potassium chloride SA (KLOR-CON) 20 MEQ tablet Take 1 tablet (20 mEq total) by mouth daily. Patient not taking: Reported on 03/16/2020 03/10/20   Bensimhon, Shaune Pascal, MD  QUEtiapine (SEROQUEL) 100 MG tablet Take 150 mg by mouth daily. Take one and one-half tablets by mouth daily as needed.    [provider]  rivaroxaban (XARELTO) 20 MG TABS tablet Take 1 tablet (20 mg total) by mouth daily. 11/03/19   Thurnell Lose, MD  sacubitril-valsartan (ENTRESTO) 49-51 MG Take 1 tablet by mouth 2 (two) times daily. 01/13/20   Bensimhon, Shaune Pascal, MD  sertraline (ZOLOFT) 25 MG tablet Take 1 tablet (25 mg total) by mouth daily. 11/25/19 11/24/20  Clegg, Amy D, NP  spironolactone (ALDACTONE) 25 MG tablet Take 1 tablet (25 mg total) by mouth daily. 12/30/19   Bensimhon, Shaune Pascal, MD  thiamine 100 MG tablet Take 1 tablet (100 mg total) by mouth daily. 11/03/19   Thurnell Lose, MD  traZODone (DESYREL) 50 MG tablet Take 1 tablet (50 mg total) by mouth at bedtime. 03/08/20 05/07/20  Kayleen Memos, Robert [name]    ALLERGIES:  Allergies  Allergen Reactions  . Bactrim [Sulfamethoxazole-Trimethoprim] Rash    SOCIAL HISTORY:  Social History   Tobacco Use  . Smoking status: Current Every Day Smoker    Packs/day: 0.50    Types: Cigarettes  . Smokeless tobacco: Never Used  Substance Use Topics  . Alcohol use: Yes    FAMILY HISTORY: Family History  Problem Relation Age of Onset  . Heart failure Father   . Diabetes Father   . Kidney disease Father     EXAM: BP (!) 97/56   Pulse 63   Temp 98.7 F (37.1 C)   Resp 12   SpO2  97%  CONSTITUTIONAL: Alert but appears very drowsy.  Answer some questions appropriately.  Seems to fall asleep quickly. HEAD: Normocephalic, atraumatic EYES: Conjunctivae clear, pupils appear equal, EOM appear intact, no conjunctival pallor ENT: normal nose; dry appearing mucous membranes NECK: Supple, normal ROM CARD: RRR; S1 and S2 appreciated; no murmurs, no clicks, no rubs, no gallops, heart rate currently in the 60s to 70s in a sinus rhythm RESP: Normal chest excursion without splinting or tachypnea; breath sounds clear and equal bilaterally; no wheezes, no rhonchi, no rales, no hypoxia or respiratory distress, speaking full sentences ABD/GI: Normal bowel sounds; non-distended; soft, non-tender, no rebound, no guarding, no peritoneal signs, no hepatosplenomegaly BACK:  The back appears normal EXT: Normal ROM in all joints; no deformity noted, no edema; no cyanosis, no calf tenderness or calf swelling SKIN: Normal color for age and race; warm; no rash on exposed skin NEURO: Moves all extremities equally, clear speech, no facial asymmetry, drowsy, no tremors PSYCH: The patient's mood and manner are appropriate.   MEDICAL DECISION MAKING: Patient here with altered mental status, generalized weakness, hypotension.  He appears hypovolemic at this time with dry lips and on bedside ultrasound his IVC is easily collapsible.  Does appear to have a reduced ejection fraction on bedside ultrasound but does not appear to be in florid cardiac shock.  He does not have a pericardial effusion.  Will give gentle IV hydration and monitor closely.  Will check rectal temperature, cultures, chest x-ray, Covid swab to ensure no infection.  He denies any bleeding but is high risk for GI bleed given his alcohol abuse.  Will check Hemoccult.  Anticipate admission.  Mother updated with plan.  ED PROGRESS: Patient has been fluid responsive.  His alcohol level is 143.  Normal hemoglobin.  No leukocytosis or leukopenia.   He does have acute kidney injury superimposed on chronic kidney disease.  Creatinine was 1.3 on 03/21/2020 and today is 2.3.  Lactate elevated which I suspect is due to dehydration rather than sepsis.  Rectal temperature 97.7.  Troponin negative.  Chest x-ray shows no sign of CHF exacerbation.  Will discuss with medicine for admission for continued hydration as a suspect symptoms are secondary to dehydration from his alcohol abuse.  7:25 AM Discussed patient's case with hospitalist, Dr. Lorin Mercy.  I have recommended admission and patient (and family if present) agree with this plan. Admitting physician will place admission orders.   I reviewed all nursing notes, vitals, pertinent previous records and reviewed/interpreted all EKGs, lab and urine results, imaging (as available).     EKG Interpretation  Date/Time:  Thursday March 23 2020 04:51:20 EST Ventricular Rate:  70 PR Interval:    QRS Duration:  96 QT Interval:  489 QTC Calculation: 528 R Axis:   61 Text Interpretation: Sinus rhythm Abnrm T, probable ischemia, anterolateral lds Prolonged QT interval No significant change since last tracing Confirmed by Pryor Curia [phone]) on 03/23/2020 4:58:25 AM        CRITICAL CARE Performed by: Cyril Mourning Quamesha Mullet   Total critical care time: 65 minutes  Critical care time was exclusive of separately billable procedures and treating other patients.  Critical care was necessary to treat or prevent imminent or life-threatening deterioration.  Critical care was time spent personally by me on the following activities: development of treatment plan with patient and/or surrogate as well as nursing, discussions with consultants, evaluation of patient's response to treatment, examination of patient, obtaining history from patient or surrogate, ordering and performing treatments and interventions, ordering and review of laboratory studies, ordering and review of radiographic studies, pulse oximetry and  re-evaluation of patient's condition.   Robert [name] was evaluated in Emergency Department on 03/23/2020 for the symptoms described in the history of present illness. He was evaluated in the context of the global COVID-19 pandemic, which necessitated consideration that the patient might be at risk for infection with the SARS-CoV-2 virus that causes COVID-19. Institutional protocols and algorithms that pertain to the evaluation of patients at risk for COVID-19 are in a state of rapid change based on information released by regulatory bodies including the CDC and federal and state organizations. These policies and algorithms were followed during the patient's care in the ED.      Robert [name], Robert Bison, Robert [name] 03/23/20 [phone]

## 2020-03-23 NOTE — ED Notes (Signed)
Patient placed on zoll monitor 

## 2020-03-23 NOTE — ED Notes (Signed)
Dr. Ward at bedside.

## 2020-03-23 NOTE — Plan of Care (Signed)
?  Problem: Coping: ?Goal: Level of anxiety will decrease ?Outcome: Progressing ?  ?Problem: Safety: ?Goal: Ability to remain free from injury will improve ?Outcome: Progressing ?  ?

## 2020-03-23 NOTE — H&P (Addendum)
History and Physical    Robert [name] GGE:[id number] DOB: Apr 09, 1981 DOA: 03/23/2020  PCP: Patient, No Pcp Per Consultants:  [facility] - cardiology Patient coming from:  Home - lives with mother; Donald Prose: Mother, [phone]  Chief Complaint: weakness  HPI: Robert [name] is a 39 y.o. male with medical history significant of ETOH dependence and chronic systolic CHF presenting with weakness, hypotension.  Last EF was 30-35% on 10/29.  He was last admitted from 10/25-27 for alcohol withdrawal.  His mother reports that yesterday he was off work.  His wife found his last evening in his room and there was something unusual.  He left something burning in the kitchen.  He reported that he felt ok but she felt like something wasn't right.  She woke up overnight and heard a noise about 330.  She found him on the floor and he was confused.  She was unable to get him up.  She is unsure how much he drinks - he drinks mini bottles.  She does not if he ate or drank yesterday.  He has been in rehab twice. Last time it was for 27 days and relapsed immediately upon release.  Last rehab was maybe a month ago.    ED Course:  Just admitted for ETOH withdrawal.  Here with hypotension, SBPs 60-70s.  Looks dry on exam, collapsible IVC.  Does not appear to have cardiogenic shock.  Has been fluid responsive.  Labs reassuring.  ETOH in 140s.  Creatinine 1.3 -> 2.3.  Review of Systems: As per HPI; otherwise review of systems reviewed and negative.   Ambulatory Status:  Ambulates without assistance  COVID Vaccine Status:  None  Past Medical History:  Diagnosis Date  . Alcohol use   . Asthma   . CHF (congestive heart failure) ([place])     Past Surgical History:  Procedure Laterality Date  . ESOPHAGOGASTRODUODENOSCOPY (EGD) WITH PROPOFOL N/A 11/01/2019   Procedure: ESOPHAGOGASTRODUODENOSCOPY (EGD) WITH PROPOFOL;  Surgeon: [name], MD;  [facility] END[facility];  Service: Endoscopy;  Laterality: N/A;  .  RIGHT/LEFT HEART CATH AND CORONARY ANGIOGRAPHY N/A 10/06/2019   Procedure: RIGHT/LEFT HEART CATH AND CORONARY ANGIOGRAPHY;  Surgeon: [name], MD;  Location: [facility];  Service: Cardiovascular;  Laterality: N/A;  . WRIST SURGERY      Social History   Socioeconomic History  . Marital status: Single    Spouse name: Not on file  . Number of children: Not on file  . Years of education: Not on file  . Highest education level: Not on file  Occupational History  . Occupation: Ecologist  Tobacco Use  . Smoking status: Current Every Day Smoker    Packs/day: 1.00    Years: 24.00    Pack years: 24.00    Types: Cigarettes  . Smokeless tobacco: Never Used  Vaping Use  . Vaping Use: Former  Substance and Sexual Activity  . Alcohol use: Yes    Comment: heavy use since age 83  . Drug use: Yes    Types: Marijuana  . Sexual activity: Not on file  Other Topics Concern  . Not on file  Social History Narrative  . Not on file   Social Determinants of Health   Financial Resource Strain: High Risk  . Difficulty of Paying Living Expenses: Very hard  Food Insecurity: Food Insecurity Present  . Worried About Charity fundraiser in the Last Year: Sometimes true  . Ran Out of Food in  the Last Year: Sometimes true  Transportation Needs:   . Lack of Transportation (Medical): Not on file  . Lack of Transportation (Non-Medical): Not on file  Physical Activity:   . Days of Exercise per Week: Not on file  . Minutes of Exercise per Session: Not on file  Stress:   . Feeling of Stress : Not on file  Social Connections:   . Frequency of Communication with Friends and Family: Not on file  . Frequency of Social Gatherings with Friends and Family: Not on file  . Attends Religious Services: Not on file  . Active Member of Clubs or Organizations: Not on file  . Attends Archivist Meetings: Not on file  . Marital Status: Not on file  Intimate Partner Violence:   .  Fear of Current or Ex-Partner: Not on file  . Emotionally Abused: Not on file  . Physically Abused: Not on file  . Sexually Abused: Not on file    Allergies  Allergen Reactions  . Bactrim [Sulfamethoxazole-Trimethoprim] Rash    Family History  Problem Relation Age of Onset  . Heart failure Father   . Diabetes Father   . Kidney disease Father     Prior to Admission medications   Medication Sig Start Date End Date Taking? Authorizing Provider  dapagliflozin propanediol (FARXIGA) 10 MG TABS tablet Take 1 tablet (10 mg total) by mouth daily before breakfast. 12/09/19   Bensimhon, Shaune Pascal, MD  digoxin (LANOXIN) 0.125 MG tablet Take 1 tablet (0.125 mg total) by mouth daily. 10/14/19   Lyda Jester M, PA-C  folic acid (FOLVITE) 1 MG tablet Take 1 tablet (1 mg total) by mouth daily. 03/16/20   Bensimhon, Shaune Pascal, MD  furosemide (LASIX) 40 MG tablet Take 1 tablet (40 mg total) by mouth daily. 03/10/20   Bensimhon, Shaune Pascal, MD  ivabradine (CORLANOR) 5 MG TABS tablet Take 1.5 tablets (7.5 mg total) by mouth 2 (two) times daily with a meal. 03/10/20   Bensimhon, Shaune Pascal, MD  Magnesium Oxide (MAG-200) 200 MG TABS Take 1 tablet (200 mg total) by mouth 2 (two) times daily. 03/16/20   Bensimhon, Shaune Pascal, MD  pantoprazole (PROTONIX) 40 MG tablet Take 40 mg by mouth 2 (two) times daily. Take 1 tablet by mouth twice daily.    [provider]  potassium chloride SA (KLOR-CON) 20 MEQ tablet Take 1 tablet (20 mEq total) by mouth daily. Patient not taking: Reported on 03/16/2020 03/10/20   Bensimhon, Shaune Pascal, MD  QUEtiapine (SEROQUEL) 100 MG tablet Take 150 mg by mouth daily. Take one and one-half tablets by mouth daily as needed.    [provider]  rivaroxaban (XARELTO) 20 MG TABS tablet Take 1 tablet (20 mg total) by mouth daily. 11/03/19   Thurnell Lose, MD  sacubitril-valsartan (ENTRESTO) 49-51 MG Take 1 tablet by mouth 2 (two) times daily. 01/13/20   Bensimhon, Shaune Pascal, MD    sertraline (ZOLOFT) 25 MG tablet Take 1 tablet (25 mg total) by mouth daily. 11/25/19 11/24/20  Clegg, Amy D, NP  spironolactone (ALDACTONE) 25 MG tablet Take 1 tablet (25 mg total) by mouth daily. 12/30/19   Bensimhon, Shaune Pascal, MD  thiamine 100 MG tablet Take 1 tablet (100 mg total) by mouth daily. 11/03/19   Thurnell Lose, MD  traZODone (DESYREL) 50 MG tablet Take 1 tablet (50 mg total) by mouth at bedtime. 03/08/20 05/07/20  Kayleen Memos, DO    Physical Exam: Vitals:   03/23/20  1230 03/23/20 1245 03/23/20 1245 03/23/20 1300  BP: 115/60 (!) 114/93 (!) 112/48 (!) 105/92  Pulse: 68 98 95 91  Resp: 14 14  17   Temp:      TempSrc:      SpO2: 98% 98%  96%     . General:  Appears calm and somnolent, very disengaged . Eyes:  PERRL, EOMI, normal lids, iris . ENT:  grossly normal hearing, lips & tongue, mildly dry mm . Neck:  no LAD, masses or thyromegaly . Cardiovascular:  RRR, no m/r/g. No LE edema.  [name] Respiratory:   CTA bilaterally with no wheezes/rales/rhonchi.  Normal respiratory effort. . Abdomen:  soft, NT, ND, NABS . Skin:  no rash or induration seen on limited exam . Musculoskeletal:  grossly normal tone BUE/BLE, good ROM, no bony abnormality . Psychiatric:  somnolent mood and affect, speech fluent and appropriate, AOx3 . Neurologic:  CN 2-12 grossly intact, moves all extremities in coordinated fashion; periodic limb jerking without apparent tremor    Radiological Exams on Admission: Independently reviewed - see discussion in A/P where applicable  DG Chest Portable 1 View  Result Date: 03/23/2020 CLINICAL DATA:  Hypotension with generalized weakness. Altered mental status EXAM: PORTABLE CHEST 1 VIEW COMPARISON:  03/06/2020 FINDINGS: Chronic cardiopericardial enlargement. There was pericardial effusion on a 05/21 chest CT. Normal aortic and hilar contours. There is no edema, consolidation, effusion, or pneumothorax. Diffuse artifact from EKG and defibrillator leads.  IMPRESSION: 1. Cardiopericardial enlargement. 2. Negative for failure. Electronically Signed   By: Monte Fantasia M.D.   On: 03/23/2020 05:47    EKG: Independently reviewed.  NSR with rate 70; prolonged QTc 528; nonspecific ST changes with NSCSLT   Labs on Admission: I have personally reviewed the available labs and imaging studies at the time of the admission.  Pertinent labs:   Glucose 132 BN 13/Creatinine 2.27/GFR 37; 15/1.33/>0 on 11/9 Anion gap 16 Mag++ 1.8 BNP 87.4 HS troponin 9 Lactate 3.8, 2.0   Assessment/Plan Principal Problem:   AKI (acute kidney injury) ([place]) Active Problems:   Chronic systolic CHF (congestive heart failure) (HCC)   Pulmonary embolism (HCC)   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[place])   AKI -Patient with recent GFR >60 now presenting with AKI -This is likely due to diminished PO intake and persistent heavy ETOH use -This is complicated by his systolic heart failure -Will rehydrate and monitor for evidence of volume overload -Will hold potentially nephrotoxic medications for now  ETOH dependence with current intoxication  -No current evidence of alcohol withdrawal.   -Patient with chronic ETOH dependence -Number of drinks per day: many -Number of admissions for management of alcohol withdrawal syndrome (detox): 1 last month -History of withdrawal seizures, ICU admissions, DTs: no -BAL on admission: 143 -He is at increased risk for complications of withdrawal including seizures, DTs -Will observe on telemetry for now -CIWA protocol -Folate, thiamine, and MVI ordered -Will provide symptom-triggered BZD (ativan per CIWA protocol) only since the patient is able to communicate; is not showing current signs of delirium; and has no history of severe withdrawal. -TOC team consult for possible inpatient treatment -Will also check UDS. -Consider offering a medication for Alcohol Use Disorder at the time of d/c, to include  Disulfuram; Naltrexone; or Acamprosate. -I have recommended [place]on for his mother (who is clearly enabling him) and a long-term treatment program such as a transitional housing program. 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 -No clinical signs of decompensation -Given his significantly depressed EF in conjunction with his need for IVF and the need to hold some of his cardiac medications, will consult the CHF team -Hold Lisabeth Register for now -Continue Ivabradine -Hold digoxin due to bradycardia in the ER -Hold diuretics - Lasix, Aldactone  HTN -Hypotension on arrival, fluid responsive -Hold Entresto and other BP-lowering medications  Hx of pulmonary embolism -Diagnosed in 09/2019 -Continue anticoagulation withXarelto  Chronic depression -Continue home sertraline, Seroquel, trazodone    Note: This patient has been tested and is negative for the novel coronavirus COVID-19. The patient has not been vaccinated against COVID-19; this should be strongly encouraged.    DVT prophylaxis: Xarelto Code Status:  Full  Family Communication: Mother was present throughout evaluation Disposition Plan:  The patient is from: home  Anticipated d/c is to: be determined - needs long-term treatment program Anticipated d/c date will depend on clinical response to treatment, but possibly as early as tomorrow if he has excellent response to treatment  Patient is currently: acutely ill Consults called: CHF team; TOC team; nutrition Admission status:  It is my clinical opinion that referral for OBSERVATION is reasonable and necessary in this patient based on the above information provided. The aforementioned taken together are felt to place the patient at high risk for further clinical deterioration. However it is anticipated that the patient may  be medically stable for discharge from the hospital within 24 to 48 hours.    Karmen Bongo MD [facility]   How to contact the [facility] Attending or Consulting provider [place] or covering provider during after hours [place], for this patient?  1. Check the care team in [facility] and look for a) attending/consulting TRH provider listed and b) the [facility] team listed 2. Log into [email or web address] and use [place]'s universal password to access. If you do not have the password, please contact the hospital operator. 3. Locate the [facility] provider you are looking for under [facility] and page to a number that you can be directly reached. 4. If you still have difficulty reaching the provider, please page the [facility] (Director on Call) for the Hospitalists listed on amion for assistance.   03/23/2020, 1:35 PM

## 2020-03-23 NOTE — ED Notes (Signed)
Lunch Tray Ordered @ 1034. 

## 2020-03-23 NOTE — ED Triage Notes (Addendum)
Pt arrives via [facility] from home where he lives with his mother. pts mother called out stating patient had become very weak, fell onto his bed and unable to get up by himself. Upon ems arrival, pt found to be bradycardia 40s-50s and hypotensive with BPs in the 60s. Pt alert. Hx of heart failure, pt was on transplant list but due to alcohol abuse has been unable to receive a new heart. pts mother stated pt has not been taking his medications as prescribed, however, pt denies this. He denies any recent heavy alcohol use but did drink some yesterday.

## 2020-03-23 NOTE — ED Notes (Signed)
Pt asked for urine sample. Unable to give at this time.

## 2020-03-23 NOTE — ED Notes (Signed)
Family at bedside. 

## 2020-03-23 NOTE — Consult Note (Addendum)
Advanced Heart Failure Team Consult Note   Primary Physician: Patient, No Pcp Per PCP-Cardiologist:  Skeet Latch, MD  HF MD: Dr Haroldine Laws   Reason for Consultation: Heart Failure   HPI:    Robert [name] is seen today for evaluation of heart failure at the request of Dr Lorin Mercy,   Robert [name] is a 39 year old with history of NICM, ETOH abuse, cocaine abuse, HTN, depression, GERD, PE, chronic biventricular heart failure.   Admitted with acute onset HF with severe biventricular failure in 5/21. EF 15%. RV down. He was drinking at least 2 beers every night and about 12-24 over the weekend. Also drinking 1/5 of liqour every other day. Cath shown normal coronaries. Elevated filling pressures with normal output. Placed on milrinone and diuresed with IV lasix. As he improved milrinone was weaned off . Transitioned oral lasix. 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 CT shows small LLL PE. Placed on xarelto.   Readmitted by [facility] and discharged 11/02/19. Admitted for mild acute blood loss related anemia. EGD showedno active bleeding but evidence of some gastritis. He was switched to oral PPI twice daily, H&H remained stable and he did not require any transfusions. He was instructed to continue Xarelto.   Completed 27 days inpatient rehab for ETOH abuse on 02/22/20. Admitted 10/25-10/28/21 for ETOH withdrawal symptoms and AKI. He was seen the day after discharge by Dr Haroldine Laws.  ECHO was completed and showed improvement with EF 30-35% and RV was normal. Corlanor was increased to 7.5 mg twice a day.   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 He has has been drinking 4-5 airplane bottles of liquor bottles most days. He says he was taking all medications. Last night says he drink 5 small bottles of liquor. His Mom found him at home unresponsive but had a pulse. She called EMS. Given IV fluids.   In the ED he has been hypotensive with SBP 60-70s.  Creatinine up (1.3 on 11/9/2 -->2.3, Alcohol 143, BNP 87, HS Trop 9>7, lactic acid 3.8>2.  CXR showed cardio pericardial enlargement. Started on IV fluids. BP improving with fluid resuscitation.   Complaining of weakness. No chest pain.   Cardiac Test Echo 03/10/2020 EF 30-35% RV normal.  ECHO 10/12/2019 Pericardial effusion remains moderate in size. No tamponade physiology. ECHO 10/05/2019 EF <20%  Prominent trabeculation. Grade III DD  R/LHC5/25/2021 showednormal coronaries, elevated filling pressures with normal output.  cMRI 10/08/19 showedNoncompaction- LVEF 14% RVEF 16%  Severely dialted R/L [facility] Review of Systems: [y] = yes, [ ]  = no   . General: Weight gain [ ] ; Weight loss [ ] ; Anorexia [ ] ; Fatigue [Y ]; Fever [ ] ; Chills [ ] ; Weakness [Y ]  . Cardiac: Chest pain/pressure [ ] ; Resting SOB [ ] ; Exertional SOB [ ] ; Orthopnea [ ] ; Pedal Edema [ ] ; Palpitations [ ] ; Syncope [ ] ; Presyncope [ ] ; Paroxysmal nocturnal dyspnea[ ]   . Pulmonary: Cough [ ] ; Wheezing[ ] ; Hemoptysis[ ] ; Sputum [ ] ; Snoring [ ]   . GI: Vomiting[ ] ; Dysphagia[ ] ; Melena[ ] ; Hematochezia [ ] ; Heartburn[ ] ; Abdominal pain [ ] ; Constipation [ ] ; Diarrhea [ ] ; BRBPR [ ]   . GU: Hematuria[ ] ; Dysuria [ ] ; Nocturia[ ]   . Vascular: Pain in legs with walking [ ] ; Pain in feet with lying flat [ ] ; Non-healing sores [ ] ; Stroke [ ] ; TIA [ ] ; Slurred speech [ ] ;  . Neuro: Headaches[ ] ; Vertigo[ ] ;  Seizures[ ] ; Paresthesias[ ] ;Blurred vision [ ] ; Diplopia [ ] ; Vision changes [ ]   . Ortho/Skin: Arthritis [ ] ; Joint pain [Y ]; Muscle pain [ ] ; Joint swelling [ ] ; Back Pain [Y ]; Rash [ ]   . Psych: Depression[Y ]; Anxiety[Y ]  . Heme: Bleeding problems [ ] ; Clotting disorders [ ] ; Anemia [ ]   . Endocrine: Diabetes [ ] ; Thyroid dysfunction[ ]   Home Medications Prior to Admission medications   Medication Sig Start Date End Date Taking? Authorizing Provider  dapagliflozin propanediol (FARXIGA) 10 MG TABS tablet Take 1 tablet (10  mg total) by mouth daily before breakfast. 12/09/19  Yes Bensimhon, Shaune Pascal, MD  digoxin (LANOXIN) 0.125 MG tablet Take 1 tablet (0.125 mg total) by mouth daily. 10/14/19  Yes Lyda Jester M, PA-C  folic acid (FOLVITE) 1 MG tablet Take 1 tablet (1 mg total) by mouth daily. 03/16/20  Yes Bensimhon, Shaune Pascal, MD  furosemide (LASIX) 40 MG tablet Take 1 tablet (40 mg total) by mouth daily. 03/10/20  Yes Bensimhon, Shaune Pascal, MD  ivabradine (CORLANOR) 5 MG TABS tablet Take 1.5 tablets (7.5 mg total) by mouth 2 (two) times daily with a meal. Patient taking differently: Take 5 mg by mouth 2 (two) times daily with a meal.  03/10/20  Yes Bensimhon, Shaune Pascal, MD  Magnesium Oxide (MAG-200) 200 MG TABS Take 1 tablet (200 mg total) by mouth 2 (two) times daily. 03/16/20  Yes Bensimhon, Shaune Pascal, MD  potassium chloride SA (KLOR-CON) 20 MEQ tablet Take 1 tablet (20 mEq total) by mouth daily. 03/10/20  Yes Bensimhon, Shaune Pascal, MD  QUEtiapine (SEROQUEL) 100 MG tablet Take 150 mg by mouth daily.    Yes [provider]  rivaroxaban (XARELTO) 20 MG TABS tablet Take 1 tablet (20 mg total) by mouth daily. 11/03/19  Yes Thurnell Lose, MD  sacubitril-valsartan (ENTRESTO) 49-51 MG Take 1 tablet by mouth 2 (two) times daily. 01/13/20  Yes Bensimhon, Shaune Pascal, MD  sertraline (ZOLOFT) 25 MG tablet Take 1 tablet (25 mg total) by mouth daily. 11/25/19 11/24/20 Yes Clegg, Amy D, NP  spironolactone (ALDACTONE) 25 MG tablet Take 1 tablet (25 mg total) by mouth daily. 12/30/19  Yes Bensimhon, Shaune Pascal, MD  thiamine 100 MG tablet Take 1 tablet (100 mg total) by mouth daily. 11/03/19  Yes Thurnell Lose, MD  traZODone (DESYREL) 50 MG tablet Take 1 tablet (50 mg total) by mouth at bedtime. 03/08/20 05/07/20 Yes Kayleen Memos, DO    Past Medical History: Past Medical History:  Diagnosis Date  . Alcohol use   . Asthma   . CHF (congestive heart failure) ([place])     Past Surgical History: Past Surgical History:    Procedure Laterality Date  . ESOPHAGOGASTRODUODENOSCOPY (EGD) WITH PROPOFOL N/A 11/01/2019   Procedure: ESOPHAGOGASTRODUODENOSCOPY (EGD) WITH PROPOFOL;  Surgeon: [name], MD;  Location: [facility] ENDOSCOPY;  Service: Endoscopy;  Laterality: N/A;  . RIGHT/LEFT HEART CATH AND CORONARY ANGIOGRAPHY N/A 10/06/2019   Procedure: RIGHT/LEFT HEART CATH AND CORONARY ANGIOGRAPHY;  Surgeon: [name], MD;  Location: [place] CV LAB;  Service: Cardiovascular;  Laterality: N/A;  . WRIST SURGERY      Family History: Family History  Problem Relation Age of Onset  . Heart failure Father   . Diabetes Father   . Kidney disease Father     Social History: Social History   Socioeconomic History  . Marital status: Single    Spouse name: Not on file  .  Number of children: Not on file  . Years of education: Not on file  . Highest education level: Not on file  Occupational History  . Occupation: Ecologist  Tobacco Use  . Smoking status: Current Every Day Smoker    Packs/day: 1.00    Years: 24.00    Pack years: 24.00    Types: Cigarettes  . Smokeless tobacco: Never Used  Vaping Use  . Vaping Use: Former  Substance and Sexual Activity  . Alcohol use: Yes    Comment: heavy use since age 20  . Drug use: Yes    Types: Marijuana  . Sexual activity: Not on file  Other Topics Concern  . Not on file  Social History Narrative  . Not on file   Social Determinants of Health   Financial Resource Strain: High Risk  . Difficulty of Paying Living Expenses: Very hard  Food Insecurity: Food Insecurity Present  . Worried About Charity fundraiser in the Last Year: Sometimes true  . Ran Out of Food in the Last Year: Sometimes true  Transportation Needs:   . Lack of Transportation (Medical): Not on file  . Lack of Transportation (Non-Medical): Not on file  Physical Activity:   . Days of Exercise per Week: Not on file  . Minutes of Exercise per Session: Not on file  Stress:   .  Feeling of Stress : Not on file  Social Connections:   . Frequency of Communication with Friends and Family: Not on file  . Frequency of Social Gatherings with Friends and Family: Not on file  . Attends Religious Services: Not on file  . Active Member of Clubs or Organizations: Not on file  . Attends Archivist Meetings: Not on file  . Marital Status: Not on file    Allergies:  Allergies  Allergen Reactions  . Bactrim [Sulfamethoxazole-Trimethoprim] Rash    Objective:    Vital Signs:   Temp:  [97.7 F (36.5 C)-98.7 F (37.1 C)] 97.7 F (36.5 C) (11/11 0559) Pulse Rate:  [49-84] 82 (11/11 0930) Resp:  [11-23] 21 (11/11 0930) BP: (70-130)/(41-91) 129/58 (11/11 0930) SpO2:  [94 %-100 %] 98 % (11/11 0930)    Weight change: There were no vitals filed for this visit.  Intake/Output:   Intake/Output Summary (Last 24 hours) at 03/23/2020 0932 Last data filed at 03/23/2020 0555 Gross per 24 hour  Intake 500 ml  Output --  Net 500 ml      Physical Exam    General: Drowsy but arousable. No resp difficulty HEENT: normal Neck: supple. JVP flat . Carotids 2+ bilat; no bruits. No lymphadenopathy or thyromegaly appreciated. Cor: PMI nondisplaced. Regular rate & rhythm. No rubs, gallops or murmurs. Lungs: clear Abdomen: soft, nontender, nondistended. No hepatosplenomegaly. No bruits or masses. Good bowel sounds. Extremities: no cyanosis, clubbing, rash, edema Neuro: alert & orientedx3, cranial nerves grossly intact. moves all 4 extremities w/o difficulty. Affect flat  Telemetry   SR 70-80s   EKG    SR 70 bpm   Labs   Basic Metabolic Panel: Recent Labs  Lab 03/16/20 1305 03/21/20 1341 03/23/20 0513 03/23/20 0519  NA 142 140 140  --   K 4.3 4.1 3.6  --   CL 104 104 101  --   CO2 27 24 23   --   GLUCOSE 100* 90 132*  --   BUN 28* 15 13  --   CREATININE 1.54* 1.33* 2.27*  --   CALCIUM 9.3 9.2  8.6*  --   MG  --   --   --  1.8    Liver Function  Tests: Recent Labs  Lab 03/23/20 0513  AST 41  ALT 64*  ALKPHOS 48  BILITOT 0.5  PROT 6.8  ALBUMIN 3.9   No results for input(s): LIPASE, AMYLASE in the last 168 hours. Recent Labs  Lab 03/23/20 0600  AMMONIA 16    CBC: Recent Labs  Lab 03/23/20 0513  WBC 5.2  NEUTROABS 3.1  HGB 11.4*  HCT 36.3*  MCV 91.4  PLT 400    Cardiac Enzymes: No results for input(s): CKTOTAL, CKMB, CKMBINDEX, TROPONINI in the last 168 hours.  BNP: BNP (last 3 results) Recent Labs    12/09/19 1004 12/30/19 1016 03/23/20 0513  BNP 434.6* 262.2* 87.4    ProBNP (last 3 results) No results for input(s): PROBNP in the last 8760 hours.   CBG: No results for input(s): GLUCAP in the last 168 hours.  Coagulation Studies: No results for input(s): LABPROT, INR in the last 72 hours.   Imaging   DG Chest Portable 1 View  Result Date: 03/23/2020 CLINICAL DATA:  Hypotension with generalized weakness. Altered mental status EXAM: PORTABLE CHEST 1 VIEW COMPARISON:  03/06/2020 FINDINGS: Chronic cardiopericardial enlargement. There was pericardial effusion on a 05/21 chest CT. Normal aortic and hilar contours. There is no edema, consolidation, effusion, or pneumothorax. Diffuse artifact from EKG and defibrillator leads. IMPRESSION: 1. Cardiopericardial enlargement. 2. Negative for failure. Electronically Signed   By: Monte Fantasia M.D.   On: 03/23/2020 05:47      Medications:     Current Medications: . diazepam  5 mg Oral Q6H  . digoxin  0.125 mg Oral Daily  . docusate sodium  100 mg Oral BID  . folic acid  1 mg Oral Daily  . insulin aspart  0-15 Units Subcutaneous TID WC  . insulin aspart  0-5 Units Subcutaneous QHS  . ivabradine  7.5 mg Oral BID WC  . LORazepam  0-4 mg Intravenous Q4H   Followed by  . [START ON 03/25/2020] LORazepam  0-4 mg Intravenous Q8H  . multivitamin with minerals  1 tablet Oral Daily  . nicotine  14 mg Transdermal Daily  . QUEtiapine  150 mg Oral Daily  .  rivaroxaban  20 mg Oral Daily  . sertraline  25 mg Oral Daily  . sodium chloride flush  3 mL Intravenous Q12H  . sodium chloride flush  3 mL Intravenous Q12H  . thiamine  100 mg Oral Daily   Or  . thiamine  100 mg Intravenous Daily  . traZODone  50 mg Oral QHS     Infusions: . sodium chloride    . lactated ringers         Patient Profile  Robert [name] is a 39 year old with history of NICM, ETOH abuse, cocaine abuse, HTN, depression, GERD, PE, chronic biventricular heart failure.   Admitted with AKI and weakness in the setting of ETOH abuse.  Assessment/Plan   1. Hypotension  -Lactic Acid 3.8>2 - WBC is not elevated.  - Afebrile.  -Suspect in the setting of dehydration. Receiving IV fluids with improved pressures.   2. AKI  -Creatinine on 03/21/20 1.3> today 2.3. Suspect dehydrated with ongoing ETOH abuse.  - Continue IV fluids.  Hold entresto/farxiga/spiro/lasix.   3. ETOH abuse/dependence -Heavy drinker for many years. Completed detox at [facility] 02/22/20. He has since started drinking again.  - Needs ongoing treatment.  - On  CIWA per primary team.   4. Chronic Systolic Heart Failure NICM had cath 09/2019 with normal coronaries. 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  - CMRI 09/2019 EF 16% + non compaction.  ECHO 02/2020 Ef 30-35% RV normal.  - Hypotensive in the ED but likely due to dehydration. On exam he does not appear volume overloaded nor does this seem to cardiogenic shock.  Would stop IV fluids after he is taking pos.  - Once improved he is typically hypertensive. May need to start hydralazine.  - Hold diuretics.  - Hold digoxin with elevated creatinine.  - Continue corlanor 7.5 mg twice a day  - Once he improved I am not sure we should restart entresto/farxiga given ongoing ETOH abuse and risk of dehydration.   - Will watch closely .  5 . H/O PE  09/2019 on CTA  Continue xarelto   6. Tobacco Abuse  Medication concerns reviewed with patient and  pharmacy team. Barriers identified:  Difficult to manage given alcohol dependence. Needs long term help.  Length of Stay: 0  Darrick Grinder, NP  03/23/2020, 9:32 AM  Advanced Heart Failure Team Pager [phone] (M-F; 7a - 4p)  Please contact [facility] for night-coverage after hours (4p -7a ) and weekends on [email or web address]  Patient seen with NP, agree with the above note   History as outlined above. Found unresponsive in the house by mother but had regular pulse.  Hypotensive initially in the ER.  He is drinking heavily again.    He has been volume resuscitated, now with SBP in 110s.  He is in NSR, no arrhythmias noted. He is awake and alert.  Lactate trending down.   General: NAD Neck: No JVD, no thyromegaly or thyroid nodule.  Lungs: Clear to auscultation bilaterally with normal respiratory effort. CV: Nondisplaced PMI.  Heart regular S1/S2, no S3/S4, no murmur.  No peripheral edema.  No carotid bruit.  Normal pedal pulses.  Abdomen: Soft, nontender, no hepatosplenomegaly, no distention.  Skin: Intact without lesions or rashes.  Neurologic: Alert and oriented x 3.  Psych: Normal affect. Extremities: No clubbing or cyanosis.  HEENT: Normal.   I agree that this episode seems likely to be due to dehydration in setting of significant ETOH abuse and ongoing use of BP-active meds for CHF.  He is not volume overloaded on exam, BP has increased rapidly with volume resuscitation.  He is not volume overloaded on exam.  Recent echo with EF 30-35%.  - Continue gentle IV fluid for now, can stop when he appears able to hydrate himself effectively by po.  - Hold dapagliflozin, Entresto, spironolactone, Lasix for now.  Can continue ivabradine.  - Will need to add back meds gradually.   AKI suspect due to dehydration, suspect will improve with hydration and improved BP.    Would aim for inpatient treatment again for ETOH abuse, would aim for a longer-term program.   Loralie Champagne 03/23/2020 10:58  AM

## 2020-03-24 ENCOUNTER — Telehealth (HOSPITAL_COMMUNITY): Payer: Self-pay | Admitting: Licensed Clinical Social Worker

## 2020-03-24 ENCOUNTER — Other Ambulatory Visit (HOSPITAL_COMMUNITY): Payer: BC Managed Care – PPO

## 2020-03-24 DIAGNOSIS — N179 Acute kidney failure, unspecified: Secondary | ICD-10-CM | POA: Diagnosis not present

## 2020-03-24 DIAGNOSIS — F10229 Alcohol dependence with intoxication, unspecified: Secondary | ICD-10-CM

## 2020-03-24 DIAGNOSIS — E872 Acidosis, unspecified: Secondary | ICD-10-CM | POA: Diagnosis present

## 2020-03-24 DIAGNOSIS — E860 Dehydration: Secondary | ICD-10-CM

## 2020-03-24 DIAGNOSIS — I9589 Other hypotension: Secondary | ICD-10-CM

## 2020-03-24 DIAGNOSIS — I2699 Other pulmonary embolism without acute cor pulmonale: Secondary | ICD-10-CM

## 2020-03-24 DIAGNOSIS — I5022 Chronic systolic (congestive) heart failure: Secondary | ICD-10-CM

## 2020-03-24 DIAGNOSIS — E861 Hypovolemia: Secondary | ICD-10-CM

## 2020-03-24 DIAGNOSIS — F1010 Alcohol abuse, uncomplicated: Secondary | ICD-10-CM

## 2020-03-24 DIAGNOSIS — F1910 Other psychoactive substance abuse, uncomplicated: Secondary | ICD-10-CM | POA: Diagnosis present

## 2020-03-24 DIAGNOSIS — I959 Hypotension, unspecified: Secondary | ICD-10-CM | POA: Diagnosis present

## 2020-03-24 LAB — CBC
HCT: 33.8 % — ABNORMAL LOW (ref 39.0–52.0)
Hemoglobin: 10.6 g/dL — ABNORMAL LOW (ref 13.0–17.0)
MCH: 28.3 pg (ref 26.0–34.0)
MCHC: 31.4 g/dL (ref 30.0–36.0)
MCV: 90.1 fL (ref 80.0–100.0)
Platelets: 351 10*3/uL (ref 150–400)
RBC: 3.75 MIL/uL — ABNORMAL LOW (ref 4.22–5.81)
RDW: 13.2 % (ref 11.5–15.5)
WBC: 4.8 10*3/uL (ref 4.0–10.5)
nRBC: 0 % (ref 0.0–0.2)

## 2020-03-24 LAB — GLUCOSE, CAPILLARY
Glucose-Capillary: 93 mg/dL (ref 70–99)
Glucose-Capillary: 94 mg/dL (ref 70–99)
Glucose-Capillary: 99 mg/dL (ref 70–99)

## 2020-03-24 LAB — BASIC METABOLIC PANEL
Anion gap: 8 (ref 5–15)
BUN: 17 mg/dL (ref 6–20)
CO2: 26 mmol/L (ref 22–32)
Calcium: 8.8 mg/dL — ABNORMAL LOW (ref 8.9–10.3)
Chloride: 103 mmol/L (ref 98–111)
Creatinine, Ser: 1.49 mg/dL — ABNORMAL HIGH (ref 0.61–1.24)
GFR, Estimated: >60 mL/min (ref >60)
Glucose, Bld: 98 mg/dL (ref 70–99)
Potassium: 3.9 mmol/L (ref 3.5–5.1)
Sodium: 137 mmol/L (ref 135–145)

## 2020-03-24 MED ORDER — DIGOXIN 125 MCG PO TABS
0.1250 mg | ORAL_TABLET | Freq: Every day | ORAL | Status: DC
  Filled 2020-03-24: qty 1

## 2020-03-24 MED ORDER — FUROSEMIDE 40 MG PO TABS: 40.0000 mg | ORAL_TABLET | Freq: Every day | ORAL | 3 refills | Status: DC

## 2020-03-24 MED ORDER — SACUBITRIL-VALSARTAN 49-51 MG PO TABS
1.0000 | ORAL_TABLET | Freq: Two times a day (BID) | ORAL | Status: DC
  Filled 2020-03-24: qty 1

## 2020-03-24 MED ORDER — SPIRONOLACTONE 25 MG PO TABS: 25.0000 mg | ORAL_TABLET | Freq: Every day | ORAL | 3 refills | Status: DC

## 2020-03-24 NOTE — Progress Notes (Signed)
D/C instructions given and reviewed. No questions asked but encouraged to call with any concerns. 

## 2020-03-24 NOTE — Discharge Instructions (Signed)
You can resume taking your lasix/furosemide and spironolactone on 03/26/20 on Sunday.  You can continue taking your other blood pressure medications    Alcohol Abuse and Dependence Information, Adult Alcohol is a widely available drug. People drink alcohol in different amounts. People who drink alcohol very often and in large amounts often have problems during and after drinking. They may develop what is called an alcohol use disorder. There are two main types of alcohol use disorders:  Alcohol abuse. This is when you use alcohol too much or too often. You may use alcohol to make yourself feel happy or to reduce stress. You may have a hard time setting a limit on the amount you drink.  Alcohol dependence. This is when you use alcohol consistently for a period of time, and your body changes as a result. This can make it hard to stop drinking because you may start to feel sick or feel different when you do not use alcohol. These symptoms are known as withdrawal. How can alcohol abuse and dependence affect me? Alcohol abuse and dependence can have a negative effect on your life. Drinking too much can lead to addiction. You may feel like you need alcohol to function normally. You may drink alcohol before work in the morning, during the day, or as soon as you get home from work in the evening. These actions can result in:  Poor work performance.  Job loss.  Financial problems.  Car crashes or criminal charges from driving after drinking alcohol.  Problems in your relationships with friends and family.  Losing the trust and respect of coworkers, friends, and family. Drinking heavily over a long period of time can permanently damage your body and brain, and can cause lifelong health issues, such as:  Damage to your liver or pancreas.  Heart problems, high blood pressure, or stroke.  Certain cancers.  Decreased ability to fight infections.  Brain or nerve damage.  Depression.  Early  (premature) death. If you are careless or you crave alcohol, it is easy to drink more than your body can handle (overdose). Alcohol overdose is a serious situation that requires hospitalization. It may lead to permanent injuries or death. What can increase my risk?  Having a family history of alcohol abuse.  Having depression or other mental health conditions.  Beginning to drink at an early age.  Binge drinking often.  Experiencing trauma, stress, and an unstable home life during childhood.  Spending time with people who drink often. What actions can I take to prevent or manage alcohol abuse and dependence?  Do not drink alcohol if: ? Your health care provider tells you not to drink. ? You are pregnant, may be pregnant, or are planning to become pregnant.  If you drink alcohol: ? Limit how much you use to:  0-1 drink a day for women.  0-2 drinks a day for men. ? Be aware of how much alcohol is in your drink. In the [place] one drink equals one 12 oz bottle of beer (355 mL), one 5 oz glass of wine (148 mL), or one 1 oz glass of hard liquor (44 mL).  Stop drinking if you have been drinking too much. This can be very hard to do if you are used to abusing alcohol. If you begin to have withdrawal symptoms, talk with your health care provider or a person that you trust. These symptoms may include anxiety, shaky hands, headache, nausea, sweating, or not being able to sleep.  Choose to  drink nonalcoholic beverages in social gatherings and places where there may be alcohol. Activity  Spend more time on activities that you enjoy that do not involve alcohol, like hobbies or exercise.  Find healthy ways to cope with stress, such as exercise, meditation, or spending time with people you care about. General information  Talk to your family, coworkers, and friends about supporting you in your efforts to stop drinking. If they drink, ask them not to drink around you. Spend more time with  people who do not drink alcohol.  If you think that you have an alcohol dependency problem: ? Tell friends or family about your concerns. ? Talk with your health care provider or another health professional about where to get help. ? Work with a Transport planner and a Regulatory affairs officer. ? Consider joining a support group for people who struggle with alcohol abuse and dependence. Where to find support   Your health care provider.  SMART Recovery: www.smartrecovery.org Therapy and support groups  Local treatment centers or chemical dependency counselors.  Local AA groups in your community: NicTax.com.pt Where to find more information  Centers for Disease Control and Prevention: http://www.wolf.info/  National Institute on Alcohol Abuse and Alcoholism: http://www.bradshaw.com/  Alcoholics Anonymous (AA): NicTax.com.pt Contact a health care provider if:  You drank more or for longer than you intended on more than one occasion.  You tried to stop drinking or to cut back on how much you drink, but you were not able to.  You often drink to the point of vomiting or passing out.  You want to drink so badly that you cannot think about anything else.  You have problems in your life due to drinking, but you continue to drink.  You keep drinking even though you feel anxious, depressed, or have experienced memory loss.  You have stopped doing the things you used to enjoy in order to drink.  You have to drink more than you used to in order to get the effect you want.  You experience anxiety, sweating, nausea, shakiness, and trouble sleeping when you try to stop drinking. Get help right away if:  You have thoughts about hurting yourself or others.  You have serious withdrawal symptoms, including: ? Confusion. ? Racing heart. ? High blood pressure. ? Fever. If you ever feel like you may hurt yourself or others, or have thoughts about taking your own life, get help right away. You can go to your  nearest emergency department or call:  Your local emergency services (911 in the [place]  A suicide crisis helpline, such as the [place] [facility] at 586 683 3431. This is open 24 hours a day. Summary  Alcohol abuse and dependence can have a negative effect on your life. Drinking too much or too often can lead to addiction.  If you drink alcohol, limit how much you use.  If you are having trouble keeping your drinking under control, find ways to change your behavior. Hobbies, calming activities, exercise, or support groups can help.  If you feel you need help with changing your drinking habits, talk with your health care provider, a good friend, or a therapist, or go to an [facility] group. This information is not intended to replace advice given to you by your health care provider. Make sure you discuss any questions you have with your health care provider. Document Revised: 08/18/2018 Document Reviewed: 07/07/2018 Elsevier Patient Education  [place].

## 2020-03-24 NOTE — Discharge Summary (Signed)
Robert [name] HYQ:[id number] DOB: 01/07/1981 DOA: 03/23/2020  PCP: Patient, No Pcp Per  Admit date: 03/23/2020 Discharge date: 03/24/2020  Admitted From: home Disposition:  home  Recommendations for Outpatient Follow-up:  1. Follow up with PCP in 1-2 weeks 2. Instructed to hold home lasix and spironolactone until 03/26/20 otherwise can continue other BP medicatations 3. Advised substance abuse/alcohol abuse cessation, patient not currently interested in any inpatient rehab treatment 4. Please obtain BMP/CBC in one week 5. Please follow up on the following pending results:  Home Health: None Equipment/Devices: None  Discharge Condition: Stable CODE STATUS: Full code Diet recommendation: Heart healthy  Brief/Interim Summary:  Hospital Course:  Robert [name] is a 39 year old male with medical history significant to but not limited by nonischemic cardiomyopathy, chronic biventricular heart failure, cocaine/alcohol abuse, HTN, PE who presented on 11/11 after being found unresponsive by his mom in the setting of admitted alcohol intoxication (his typical amount of of "airplane" liquor bottles) and gummy bears laced with THC.  Patient was found to have hypotension with SBP in the 70s requiring IV fluids presumed to be related to alcohol intoxication/substance use in addition to continued use of home blood pressure medications/diuretics.  Hospital course complicated by AKI as well.  Heart failure team was consulted given his known history of biventricular heart failure and further assistance with diuretic management.  Hypotension with lactic acidosis, resolved.  In the setting of dehydration continue use of hypertensive medication and diuretics.  Patient was afebrile, no leukocytosis, no localizing signs symptoms of infection, chest x-ray unremarkable, UA unremarkable. -Did not require antibiotics none-his blood pressure quickly responded to IV fluids, and patient was able to maintain that  p.o. -His home Entresto/procedure/spironolactone/Lasix were held for 24 hours before resuming as addressed more below  AKI on CKD.  Baseline creatinine of 1.4, on admission peak of 2.27 in the setting of dehydration/hypotension consistent with prerenal etiology.  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  No signs of exacerbation on admission, remains euvolemic. -Can resume home: Corlanor, Lisabeth Register, digoxin today per cardiology, can restart home Lasix/spironolactone on 11/14  History of PE.  Normal oxygen saturation on room air -Continue home Xarelto  Polysubstance abuse/alcohol abuse/dependence.  Admitted for many years.  Recently completed inpatient detox of on 02/22/2020.  He is not interested in resuming treatment for fear of losing his job. -Encourage cessation of alcohol intake/abuse -Monitor on CIWA protocol, no current signs of withdrawal on discharge-continue multivitamin, thiamine, folic acid   Consultations:  CHF/Cardiology  Procedures/Studies: none Subjective: Feels well, no chest pain. Eating well. Hopeful to go home Discharge Exam: Vitals:   03/23/20 2334 03/24/20 0349  BP: 109/71 127/70  Pulse: 79 78  Resp: 20 20  Temp: 98.6 F (37 C) (!) 97.3 F (36.3 C)  SpO2: 94% 97%   Vitals:   03/23/20 1617 03/23/20 1953 03/23/20 2334 03/24/20 0349  BP: (!) 119/58 112/81 109/71 127/70  Pulse: 68 84 79 78  Resp: 18 20 20 20   Temp: 99 F (37.2 C) 98.7 F (37.1 C) 98.6 F (37 C) (!) 97.3 F (36.3 C)  TempSrc: Oral Oral Oral Oral  SpO2: 100% 99% 94% 97%  Weight:    94.5 kg    General: Lying in bed, no apparent distress Eyes: EOMI, anicteric ENT: Oral Mucosa clear and moist Cardiovascular: regular rate and  rhythm, no murmurs, rubs or gallops, no  edema, Respiratory: Normal respiratory effort on room air, lungs clear to auscultation bilaterally Abdomen: soft, non-distended, non-tender, normal bowel sounds Skin: No Rash Neurologic: Grossly no focal neuro deficit.Mental status AAOx3, speech normal, Psychiatric:Appropriate affect, and mood  Discharge Diagnoses:  Principal Problem:   AKI (acute kidney injury) ([place]) Active Problems:   Chronic systolic CHF (congestive heart failure) ([place])   Pulmonary embolism ([facility])   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[facility])   Hypotension   Lactic acidosis   Polysubstance abuse [facility])    Discharge Instructions  Discharge Instructions    Diet - low sodium heart healthy   Complete by: As directed    Increase activity slowly   Complete by: As directed      Allergies as of 03/24/2020      Reactions   Bactrim [sulfamethoxazole-trimethoprim] Rash      Medication List    TAKE these medications   dapagliflozin propanediol 10 MG Tabs tablet Commonly known as: Farxiga Take 1 tablet (10 mg total) by mouth daily before breakfast.   digoxin 0.125 MG tablet Commonly known as: LANOXIN Take 1 tablet (0.125 mg total) by mouth daily.   folic acid 1 MG tablet Commonly known as: FOLVITE Take 1 tablet (1 mg total) by mouth daily.   furosemide 40 MG tablet Commonly known as: LASIX Take 1 tablet (40 mg total) by mouth daily. HOLD until 03/26/20 What changed: additional instructions   ivabradine 5 MG Tabs tablet Commonly known as: CORLANOR Take 1.5 tablets (7.5 mg total) by mouth 2 (two) times daily with a meal. What changed: how much to take   Mag-200 200 MG Tabs Generic drug: Magnesium Oxide Take 1 tablet (200 mg total) by mouth 2 (two) times daily.   potassium chloride SA 20 MEQ tablet Commonly known as: KLOR-CON Take 1 tablet (20 mEq total) by mouth daily.   QUEtiapine 100 MG tablet Commonly known as: SEROQUEL Take 150 mg by mouth daily.   rivaroxaban 20 MG  Tabs tablet Commonly known as: XARELTO Take 1 tablet (20 mg total) by mouth daily.   sacubitril-valsartan 49-51 MG Commonly known as: ENTRESTO Take 1 tablet by mouth 2 (two) times daily.   sertraline 25 MG tablet Commonly known as: Zoloft Take 1 tablet (25 mg total) by mouth daily.   spironolactone 25 MG tablet Commonly known as: ALDACTONE Take 1 tablet (25 mg total) by mouth daily. HOLD until 03/26/20 What changed: additional instructions   thiamine 100 MG tablet Take 1 tablet (100 mg total) by mouth daily.   traZODone 50 MG tablet Commonly known as: DESYREL Take 1 tablet (50 mg total) by mouth at bedtime.       Follow-up Information    Nche, Charlene Brooke, NP. Go on 05/16/2020.   Specialty: Internal Medicine Why: @1 :30pm Contact information: [address] [place] [address] [phone]              Allergies  Allergen Reactions  . Bactrim [Sulfamethoxazole-Trimethoprim] Rash        The results of significant diagnostics from this hospitalization (including imaging, microbiology, ancillary and laboratory) are listed below for reference.     Microbiology: Recent Results (from the past 240 hour(s))  Respiratory Panel by RT PCR (Flu A&B, Covid) - Nasopharyngeal Swab     Status: None   Collection Time: 03/23/20  5:57 AM   Specimen: Nasopharyngeal Swab  Result Value Ref Range Status   SARS Coronavirus 2  by RT PCR NEGATIVE NEGATIVE Final    Comment: (NOTE) SARS-CoV-2 target nucleic acids are NOT DETECTED.  The SARS-CoV-2 RNA is generally detectable in upper respiratoy specimens during the acute phase of infection. The lowest concentration of SARS-CoV-2 viral copies this assay can detect is 131 copies/mL. A negative result does not preclude SARS-Cov-2 infection and should not be used as the sole basis for treatment or other patient management decisions. A negative result may occur with  improper specimen collection/handling, submission of  specimen other than nasopharyngeal swab, presence of viral mutation(s) within the areas targeted by this assay, and inadequate number of viral copies (<131 copies/mL). A negative result must be combined with clinical observations, patient history, and epidemiological information. The expected result is Negative.  Fact Sheet for Patients:  PinkCheek.be  Fact Sheet for Healthcare Providers:  GravelBags.it  This test is no t yet approved or cleared by the [place] FDA and  has been authorized for detection and/or diagnosis of SARS-CoV-2 by FDA under an Emergency Use Authorization (EUA). This EUA will remain  in effect (meaning this test can be used) for the duration of the COVID-19 declaration under Section 564(b)(1) of the Act, 21 U.S.C. section 360bbb-3(b)(1), unless the authorization is terminated or revoked sooner.     Influenza A by PCR NEGATIVE NEGATIVE Final   Influenza B by PCR NEGATIVE NEGATIVE Final    Comment: (NOTE) The Xpert Xpress SARS-CoV-2/FLU/RSV assay is intended as an aid in  the diagnosis of influenza from Nasopharyngeal swab specimens and  should not be used as a sole basis for treatment. Nasal washings and  aspirates are unacceptable for Xpert Xpress SARS-CoV-2/FLU/RSV  testing.  Fact Sheet for Patients: PinkCheek.be  Fact Sheet for Healthcare Providers: GravelBags.it  This test is not yet approved or cleared by the [place] FDA and  has been authorized for detection and/or diagnosis of SARS-CoV-2 by  FDA under an Emergency Use Authorization (EUA). This EUA will remain  in effect (meaning this test can be used) for the duration of the  Covid-19 declaration under Section 564(b)(1) of the Act, 21  U.S.C. section 360bbb-3(b)(1), unless the authorization is  terminated or revoked. Performed at [facility], [place] [address]  [place], [place] [address]   Blood culture (routine x 2)     Status: None (Preliminary result)   Collection Time: 03/23/20  6:00 AM   Specimen: BLOOD  Result Value Ref Range Status   Specimen Description BLOOD RIGHT ANTECUBITAL  Final   Special Requests   Final    BOTTLES DRAWN AEROBIC AND ANAEROBIC Blood Culture adequate volume   Culture   Final    NO GROWTH 1 DAY Performed at [facility], [place] [address] [place], [place] [address]    Report Status PENDING  Incomplete  Blood culture (routine x 2)     Status: None (Preliminary result)   Collection Time: 03/23/20  6:00 AM   Specimen: BLOOD  Result Value Ref Range Status   Specimen Description BLOOD RIGHT ANTECUBITAL  Final   Special Requests AEROBIC BOTTLE ONLY Blood Culture adequate volume  Final   Culture   Final    NO GROWTH 1 DAY Performed at [facility], [place] [address] [place],  [address]    Report Status PENDING  Incomplete     Labs: BNP (last 3 results) Recent Labs    12/09/19 1004 12/30/19 1016 03/23/20 0513  BNP 434.6* 262.2* 10.9   Basic Metabolic Panel:  Recent Labs  Lab 03/21/20 1341 03/23/20 0513 03/23/20 0519 03/24/20 0453  NA 140 140  --  137  K 4.1 3.6  --  3.9  CL 104 101  --  103  CO2 24 23  --  26  GLUCOSE 90 132*  --  98  BUN 15 13  --  17  CREATININE 1.33* 2.27*  --  1.49*  CALCIUM 9.2 8.6*  --  8.8*  MG  --   --  1.8  --    Liver Function Tests: Recent Labs  Lab 03/23/20 0513  AST 41  ALT 64*  ALKPHOS 48  BILITOT 0.5  PROT 6.8  ALBUMIN 3.9   No results for input(s): LIPASE, AMYLASE in the last 168 hours. Recent Labs  Lab 03/23/20 0600  AMMONIA 16   CBC: Recent Labs  Lab 03/23/20 0513 03/24/20 0453  WBC 5.2 4.8  NEUTROABS 3.1  --   HGB 11.4* 10.6*  HCT 36.3* 33.8*  MCV 91.4 90.1  PLT 400 351   Cardiac Enzymes: No results for input(s): CKTOTAL, CKMB, CKMBINDEX, TROPONINI in the last 168 hours. BNP: Invalid input(s): POCBNP CBG: Recent Labs  Lab  03/23/20 1242 03/24/20 0019 03/24/20 0643 03/24/20 0735  GLUCAP 100* 93 94 99   D-Dimer No results for input(s): DDIMER in the last 72 hours. Hgb A1c No results for input(s): HGBA1C in the last 72 hours. Lipid Profile No results for input(s): CHOL, HDL, LDLCALC, TRIG, CHOLHDL, LDLDIRECT in the last 72 hours. Thyroid function studies Recent Labs    03/23/20 0515  TSH 0.934   Anemia work up No results for input(s): VITAMINB12, FOLATE, FERRITIN, TIBC, IRON, RETICCTPCT in the last 72 hours. Urinalysis No results found for: COLORURINE, APPEARANCEUR, [place], [place], [place], [place], [place], [place], PROTEINUR, UROBILINOGEN, NITRITE, LEUKOCYTESUR Sepsis Labs Invalid input(s): PROCALCITONIN,  WBC,  LACTICIDVEN Microbiology Recent Results (from the past 240 hour(s))  Respiratory Panel by RT PCR (Flu A&B, Covid) - Nasopharyngeal Swab     Status: None   Collection Time: 03/23/20  5:57 AM   Specimen: Nasopharyngeal Swab  Result Value Ref Range Status   SARS Coronavirus 2 by RT PCR NEGATIVE NEGATIVE Final    Comment: (NOTE) SARS-CoV-2 target nucleic acids are NOT DETECTED.  The SARS-CoV-2 RNA is generally detectable in upper respiratoy specimens during the acute phase of infection. The lowest concentration of SARS-CoV-2 viral copies this assay can detect is 131 copies/mL. A negative result does not preclude SARS-Cov-2 infection and should not be used as the sole basis for treatment or other patient management decisions. A negative result may occur with  improper specimen collection/handling, submission of specimen other than nasopharyngeal swab, presence of viral mutation(s) within the areas targeted by this assay, and inadequate number of viral copies (<131 copies/mL). A negative result must be combined with clinical observations, patient history, and epidemiological information. The expected result is Negative.  Fact Sheet for Patients:   PinkCheek.be  Fact Sheet for Healthcare Providers:  GravelBags.it  This test is no t yet approved or cleared by the [place] FDA and  has been authorized for detection and/or diagnosis of SARS-CoV-2 by FDA under an Emergency Use Authorization (EUA). This EUA will remain  in effect (meaning this test can be used) for the duration of the COVID-19 declaration under Section 564(b)(1) of the Act, 21 U.S.C. section 360bbb-3(b)(1), unless the authorization is terminated or revoked sooner.     Influenza A by PCR NEGATIVE NEGATIVE Final   Influenza B by  PCR NEGATIVE NEGATIVE Final    Comment: (NOTE) The Xpert Xpress SARS-CoV-2/FLU/RSV assay is intended as an aid in  the diagnosis of influenza from Nasopharyngeal swab specimens and  should not be used as a sole basis for treatment. Nasal washings and  aspirates are unacceptable for Xpert Xpress SARS-CoV-2/FLU/RSV  testing.  Fact Sheet for Patients: PinkCheek.be  Fact Sheet for Healthcare Providers: GravelBags.it  This test is not yet approved or cleared by the [place] FDA and  has been authorized for detection and/or diagnosis of SARS-CoV-2 by  FDA under an Emergency Use Authorization (EUA). This EUA will remain  in effect (meaning this test can be used) for the duration of the  Covid-19 declaration under Section 564(b)(1) of the Act, 21  U.S.C. section 360bbb-3(b)(1), unless the authorization is  terminated or revoked. Performed at [facility], [place] [address], [place], [place] [address]   Blood culture (routine x 2)     Status: None (Preliminary result)   Collection Time: 03/23/20  6:00 AM   Specimen: BLOOD  Result Value Ref Range Status   Specimen Description BLOOD RIGHT ANTECUBITAL  Final   Special Requests   Final    BOTTLES DRAWN AEROBIC AND ANAEROBIC Blood Culture adequate volume   Culture    Final    NO GROWTH 1 DAY Performed at [facility], [place] [address], [place], [place] [address]    Report Status PENDING  Incomplete  Blood culture (routine x 2)     Status: None (Preliminary result)   Collection Time: 03/23/20  6:00 AM   Specimen: BLOOD  Result Value Ref Range Status   Specimen Description BLOOD RIGHT ANTECUBITAL  Final   Special Requests AEROBIC BOTTLE ONLY Blood Culture adequate volume  Final   Culture   Final    NO GROWTH 1 DAY Performed at [facility], [place] [address], [place], [place] [address]    Report Status PENDING  Incomplete     Time coordinating discharge: Over 30 minutes  SIGNED:   Desiree Hane, MD  [facility] 03/24/2020, 11:30 AM Pager   If 7PM-7AM, please contact night-coverage [email or web address] Password [email or web address]

## 2020-03-24 NOTE — Plan of Care (Signed)

## 2020-03-24 NOTE — Telephone Encounter (Signed)
CSW called pt to check in follow discharge from hospital.  Pt reports he is doing ok and feels as if admission was a misunderstanding that his mom thought he was unresponsive but that he just fell out of bed.  CSW inquired with pt regarding current drinking- pt admits to drinking sporadically but feels as if his drinking has reduced significantly since inpatient rehab stay.  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  Pt is willing to get further help.  Was set up with outpatient program with [facility] but missed 2 appts so was dropped from the program.  Pt is not interested in inpatient rehab as he wants to continue working- feels as if he would do best with individual therapy as he doesn't think he could communicate in a group setting.  CSW send pt several numbers for outpatient substance abuse counseling resources- pt will plan to follow up to get established with counselor.  Will continue to follow and assist as needed  Jorge Ny, [facility] Desk#: [phone] Cell#: [phone]

## 2020-03-24 NOTE — Progress Notes (Addendum)
Advanced Heart Failure Team Consult Note   Primary Physician: Patient, No Pcp Per PCP-Cardiologist:  Skeet Latch, MD  HF MD: Dr Haroldine Laws   Reason for Consultation: Heart Failure   Subjective:   Robert [name] is a 39 year old with history of NICM, ETOH abuse, cocaine abuse, HTN, depression, GERD, PE, chronic biventricular heart failure.  Admitted 11/11 under Hospitalist services for weakness, hypotension and AKI.    Today: Sleepy in room, reports no complaints.    Cardiac Test Echo 03/10/2020 EF 30-35% RV normal.  ECHO 10/12/2019 Pericardial effusion remains moderate in size. No tamponade physiology. ECHO 10/05/2019 EF <20%  Prominent trabeculation. Grade III DD  R/LHC5/25/2021 showednormal coronaries, elevated filling pressures with normal output.  cMRI 10/08/19 showedNoncompaction- LVEF 14% RVEF 16%  Severely dialted R/L atrium  Home Medications Prior to Admission medications   Medication Sig Start Date End Date Taking? Authorizing Provider  dapagliflozin propanediol (FARXIGA) 10 MG TABS tablet Take 1 tablet (10 mg total) by mouth daily before breakfast. 12/09/19  Yes Florena Kozma, Shaune Pascal, MD  digoxin (LANOXIN) 0.125 MG tablet Take 1 tablet (0.125 mg total) by mouth daily. 10/14/19  Yes Lyda Jester M, PA-C  folic acid (FOLVITE) 1 MG tablet Take 1 tablet (1 mg total) by mouth daily. 03/16/20  Yes Emiyah Spraggins, Shaune Pascal, MD  furosemide (LASIX) 40 MG tablet Take 1 tablet (40 mg total) by mouth daily. 03/10/20  Yes Natasia Sanko, Shaune Pascal, MD  ivabradine (CORLANOR) 5 MG TABS tablet Take 1.5 tablets (7.5 mg total) by mouth 2 (two) times daily with a meal. Patient taking differently: Take 5 mg by mouth 2 (two) times daily with a meal.  03/10/20  Yes Keyani Rigdon, Shaune Pascal, MD  Magnesium Oxide (MAG-200) 200 MG TABS Take 1 tablet (200 mg total) by mouth 2 (two) times daily. 03/16/20  Yes Cung Masterson, Shaune Pascal, MD  potassium chloride SA (KLOR-CON) 20 MEQ tablet Take 1 tablet (20 mEq total) by  mouth daily. 03/10/20  Yes Hadassah Rana, Shaune Pascal, MD  QUEtiapine (SEROQUEL) 100 MG tablet Take 150 mg by mouth daily.    Yes [provider]  rivaroxaban (XARELTO) 20 MG TABS tablet Take 1 tablet (20 mg total) by mouth daily. 11/03/19  Yes Thurnell Lose, MD  sacubitril-valsartan (ENTRESTO) 49-51 MG Take 1 tablet by mouth 2 (two) times daily. 01/13/20  Yes Netanel Yannuzzi, Shaune Pascal, MD  sertraline (ZOLOFT) 25 MG tablet Take 1 tablet (25 mg total) by mouth daily. 11/25/19 11/24/20 Yes Clegg, Amy D, NP  spironolactone (ALDACTONE) 25 MG tablet Take 1 tablet (25 mg total) by mouth daily. 12/30/19  Yes Dael Howland, Shaune Pascal, MD  thiamine 100 MG tablet Take 1 tablet (100 mg total) by mouth daily. 11/03/19  Yes Thurnell Lose, MD  traZODone (DESYREL) 50 MG tablet Take 1 tablet (50 mg total) by mouth at bedtime. 03/08/20 05/07/20 Yes Kayleen Memos, DO    Past Medical History: Past Medical History:  Diagnosis Date  . Alcohol use   . Asthma   . CHF (congestive heart failure) ([place])     Past Surgical History: Past Surgical History:  Procedure Laterality Date  . ESOPHAGOGASTRODUODENOSCOPY (EGD) WITH PROPOFOL N/A 11/01/2019   Procedure: ESOPHAGOGASTRODUODENOSCOPY (EGD) WITH PROPOFOL;  Surgeon: [name], MD;  Location: [facility] ENDOSCOPY;  Service: Endoscopy;  Laterality: N/A;  . RIGHT/LEFT HEART CATH AND CORONARY ANGIOGRAPHY N/A 10/06/2019   Procedure: RIGHT/LEFT HEART CATH AND CORONARY ANGIOGRAPHY;  Surgeon: [name], MD;  Location: [place] CV LAB;  Service: Cardiovascular;  Laterality: N/A;  . WRIST SURGERY      Family History: Family History  Problem Relation Age of Onset  . Heart failure Father   . Diabetes Father   . Kidney disease Father     Social History: Social History   Socioeconomic History  . Marital status: Single    Spouse name: Not on file  . Number of children: Not on file  . Years of education: Not on file  . Highest education level: Not on file  Occupational  History  . Occupation: Ecologist  Tobacco Use  . Smoking status: Current Every Day Smoker    Packs/day: 1.00    Years: 24.00    Pack years: 24.00    Types: Cigarettes  . Smokeless tobacco: Never Used  Vaping Use  . Vaping Use: Former  Substance and Sexual Activity  . Alcohol use: Yes    Comment: heavy use since age 17  . Drug use: Yes    Types: Marijuana  . Sexual activity: Not on file  Other Topics Concern  . Not on file  Social History Narrative  . Not on file   Social Determinants of Health   Financial Resource Strain: High Risk  . Difficulty of Paying Living Expenses: Very hard  Food Insecurity: Food Insecurity Present  . Worried About Charity fundraiser in the Last Year: Sometimes true  . Ran Out of Food in the Last Year: Sometimes true  Transportation Needs:   . Lack of Transportation (Medical): Not on file  . Lack of Transportation (Non-Medical): Not on file  Physical Activity:   . Days of Exercise per Week: Not on file  . Minutes of Exercise per Session: Not on file  Stress:   . Feeling of Stress : Not on file  Social Connections:   . Frequency of Communication with Friends and Family: Not on file  . Frequency of Social Gatherings with Friends and Family: Not on file  . Attends Religious Services: Not on file  . Active Member of Clubs or Organizations: Not on file  . Attends Archivist Meetings: Not on file  . Marital Status: Not on file    Allergies:  Allergies  Allergen Reactions  . Bactrim [Sulfamethoxazole-Trimethoprim] Rash    Objective:    Vital Signs:   Temp:  [97.3 F (36.3 C)-99 F (37.2 C)] 97.3 F (36.3 C) (11/12 0349) Pulse Rate:  [49-105] 78 (11/12 0349) Resp:  [11-29] 20 (11/12 0349) BP: (90-130)/(47-93) 127/70 (11/12 0349) SpO2:  [94 %-100 %] 97 % (11/12 0349) Weight:  [94.5 kg] 94.5 kg (11/12 0349)    Weight change: Filed Weights   03/24/20 0349  Weight: 94.5 kg    Intake/Output:   Intake/Output  Summary (Last 24 hours) at 03/24/2020 0932 Last data filed at 03/23/2020 2242 Gross per 24 hour  Intake 1480 ml  Output --  Net 1480 ml      Physical Exam    General:  Well appearing but sleepy. No resp difficulty.  HEENT: normal Neck: supple. no JVD. Carotids 2+ bilat; no bruits. No lymphadenopathy or thryomegaly appreciated. Cor: PMI nondisplaced. Regular rate & rhythm. No rubs, or gallops. + Murmur Lungs: clear bilaterally.  Abdomen: soft, nontender, nondistended. No hepatosplenomegaly. No bruits or masses. Bowel sounds active in all 4 quadrants.  Extremities: no cyanosis, clubbing, and rash.  Trace edema bilaterally.  Neuro: alert & oriented x 3, moves all 4 extremities w/o difficulty. Affect pleasant  Telemetry  SR rates in 80-90s.  Personally reviewed.     Labs   Basic Metabolic Panel: Recent Labs  Lab 03/21/20 1341 03/23/20 0513 03/23/20 0519 03/24/20 0453  NA 140 140  --  137  K 4.1 3.6  --  3.9  CL 104 101  --  103  CO2 24 23  --  26  GLUCOSE 90 132*  --  98  BUN 15 13  --  17  CREATININE 1.33* 2.27*  --  1.49*  CALCIUM 9.2 8.6*  --  8.8*  MG  --   --  1.8  --     Liver Function Tests: Recent Labs  Lab 03/23/20 0513  AST 41  ALT 64*  ALKPHOS 48  BILITOT 0.5  PROT 6.8  ALBUMIN 3.9   No results for input(s): LIPASE, AMYLASE in the last 168 hours. Recent Labs  Lab 03/23/20 0600  AMMONIA 16    CBC: Recent Labs  Lab 03/23/20 0513 03/24/20 0453  WBC 5.2 4.8  NEUTROABS 3.1  --   HGB 11.4* 10.6*  HCT 36.3* 33.8*  MCV 91.4 90.1  PLT 400 351    Cardiac Enzymes: No results for input(s): CKTOTAL, CKMB, CKMBINDEX, TROPONINI in the last 168 hours.  BNP: BNP (last 3 results) Recent Labs    12/09/19 1004 12/30/19 1016 03/23/20 0513  BNP 434.6* 262.2* 87.4    ProBNP (last 3 results) No results for input(s): PROBNP in the last 8760 hours.   CBG: Recent Labs  Lab 03/23/20 1242 03/24/20 0019 03/24/20 0643  GLUCAP 100* 93 94     Coagulation Studies: No results for input(s): LABPROT, INR in the last 72 hours.   Imaging   No results found.   Medications:     Current Medications: . diazepam  5 mg Oral Q6H  . docusate sodium  100 mg Oral BID  . folic acid  1 mg Oral Daily  . insulin aspart  0-15 Units Subcutaneous TID WC  . insulin aspart  0-5 Units Subcutaneous QHS  . ivabradine  7.5 mg Oral BID WC  . LORazepam  0-4 mg Intravenous Q4H   Followed by  . [START ON 03/25/2020] LORazepam  0-4 mg Intravenous Q8H  . multivitamin with minerals  1 tablet Oral Daily  . nicotine  14 mg Transdermal Daily  . QUEtiapine  150 mg Oral Daily  . rivaroxaban  20 mg Oral Daily  . sertraline  25 mg Oral Daily  . sodium chloride flush  3 mL Intravenous Q12H  . sodium chloride flush  3 mL Intravenous Q12H  . thiamine  100 mg Oral Daily   Or  . thiamine  100 mg Intravenous Daily  . traZODone  50 mg Oral QHS    Infusions: . sodium chloride    . lactated ringers 75 mL/hr at 03/23/20 1010      Patient Profile  Robert [name] is a 39 year old with history of NICM, ETOH abuse, cocaine abuse, HTN, depression, GERD, PE, chronic biventricular heart failure.   Admitted with AKI and weakness in the setting of ETOH abuse.  Assessment/Plan   1. Hypotension  -Lactic Acid 3.8 > 2 - WBC is not elevated and remains afebrile.  -Suspect in the setting of dehydration. Receiving IV fluids at rate of 74mL/H with improved pressures (received total of +1.9L via IVFs).  2. AKI  - Creatinine peak 2.27 -Today 1.49. - baseline Cr around 1-1.3  - Suspect dehydrated with ongoing ETOH abuse. - Would stop  IVFs since tolerating PO intake.   - Holding entresto/farxiga/spiro/lasix.   3. ETOH abuse/dependence -Heavy drinker for many years. Completed detox at [facility] 02/22/20. He has since started drinking again.  - Needs ongoing treatment.  - On CIWA per primary team.   4. Chronic Systolic Heart Failure - NICM had cath 09/2019 with  normal coronaries. 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  - CMRI 09/2019 EF 16% + non compaction.  - ECHO 02/2020 Ef 30-35% RV normal.  - Hypotensive in the ED but likely due to dehydration.  - Would stop IVFs since tolerating PO intake.  - Once improved he is typically hypertensive. May need to start hydralazine.  - Hold diuretics and digoxin with improving creatinine.  - Continue corlanor 7.5 mg twice a day  - Need to consider if we should restart entresto/farxiga given ongoing ETOH abuse and risk of dehydration.   - Will watch closely .  5 . H/O PE  - 09/2019 on CTA  - Continue xarelto   6. Tobacco Abuse  Length of Stay: 0  Carlene Coria, NP  03/24/2020, 7:02 AM  Advanced Heart Failure Team Pager [phone] (M-F; 7a - 4p)  Please contact [facility] for night-coverage after hours (4p -7a ) and weekends on [email or web address]  Patient seen and examined with the above-signed Advanced Practice Provider and/or Housestaff. I personally reviewed laboratory data, imaging studies and relevant notes. I independently examined the patient and formulated the important aspects of the plan. I have edited the note to reflect any of my changes or salient points. I have personally discussed the plan with the patient and/or family.  Looks and feels better. Creatinine improving. BP stable. No CP or SOB.  General:  Well appearing. No resp difficulty HEENT: normal Neck: supple. no JVD. Carotids 2+ bilat; no bruits. No lymphadenopathy or thryomegaly appreciated. Cor: PMI nondisplaced. Regular rate & rhythm. No rubs, gallops or murmurs. Lungs: clear Abdomen: soft, nontender, nondistended. No hepatosplenomegaly. No bruits or masses. Good bowel sounds. Extremities: no cyanosis, clubbing, rash, edema Neuro: alert & orientedx3, cranial nerves grossly intact. moves all 4 extremities w/o difficulty. Affect pleasant  Back to baseline. I think he can go home today on digoxin, entresto and corlanor. Restart  diuretics on Sunday. Continue to follow with paramedicine. Needs etoh detox.   Glori Bickers, MD  10:38 AM

## 2020-03-24 NOTE — Progress Notes (Addendum)
Nutrition Brief Note  RD consulted for assessment for assessment of nutrition requirements and status.   Wt Readings from Last 15 Encounters:  03/24/20 94.5 kg  03/16/20 96.2 kg  03/10/20 94.7 kg  03/06/20 90.7 kg  02/24/20 90.7 kg  01/21/20 87.1 kg  01/13/20 85.7 kg  01/09/20 85.7 kg  01/09/20 81.6 kg  01/05/20 85.7 kg  12/29/19 89 kg  12/09/19 88 kg  12/02/19 84.8 kg  11/29/19 83.5 kg  11/25/19 84.5 kg   Robert [name] is a 39 y.o. male with medical history significant of ETOH dependence and chronic systolic CHF presenting with weakness, hypotension.  Pt admitted with AKI and ETOH dependence with recurrent intoxication.   Spoke with pt at bedside, who was not very interactive with this RD. He reports good appetite- he consumed "almost all" of his breakfast this morning. Per pt, good appetite PTA. He generally consumes 3 meals per day.   Pt denies any weight loss. Pt reports weight fluctuates at baseline, but usually weighs around 210#.   Nutrition-Focused physical exam completed. Findings are no fat depletion, no muscle depletion, and mild edema.   Pt denies any further nutrition-related concerns or questions at this time.   Current diet order is heart healthy, carb modified, patient is consuming approximately 90% of meals at this time. Labs and medications reviewed.   No nutrition interventions warranted at this time. If nutrition issues arise, please consult RD.   Robert [name], RD, LDN, [place] Registered Dietitian II Certified Diabetes Care and Education Specialist Please refer to [facility] for RD and/or RD on-call/weekend/after hours pager

## 2020-03-26 ENCOUNTER — Emergency Department (HOSPITAL_BASED_OUTPATIENT_CLINIC_OR_DEPARTMENT_OTHER)
Admission: EM | Admit: 2020-03-26 | Discharge: 2020-03-26 | Disposition: A | Discharge status: Home / Self Care | Payer: BC Managed Care – PPO | Attending: Emergency Medicine | Admitting: Emergency Medicine

## 2020-03-26 ENCOUNTER — Emergency Department (HOSPITAL_BASED_OUTPATIENT_CLINIC_OR_DEPARTMENT_OTHER): Payer: BC Managed Care – PPO

## 2020-03-26 ENCOUNTER — Other Ambulatory Visit: Payer: Self-pay

## 2020-03-26 ENCOUNTER — Encounter (HOSPITAL_BASED_OUTPATIENT_CLINIC_OR_DEPARTMENT_OTHER): Payer: Self-pay | Admitting: Emergency Medicine

## 2020-03-26 DIAGNOSIS — F17210 Nicotine dependence, cigarettes, uncomplicated: Secondary | ICD-10-CM | POA: Diagnosis not present

## 2020-03-26 DIAGNOSIS — R5383 Other fatigue: Secondary | ICD-10-CM | POA: Insufficient documentation

## 2020-03-26 DIAGNOSIS — Z955 Presence of coronary angioplasty implant and graft: Secondary | ICD-10-CM | POA: Diagnosis not present

## 2020-03-26 DIAGNOSIS — I509 Heart failure, unspecified: Secondary | ICD-10-CM | POA: Insufficient documentation

## 2020-03-26 DIAGNOSIS — J45909 Unspecified asthma, uncomplicated: Secondary | ICD-10-CM | POA: Diagnosis not present

## 2020-03-26 DIAGNOSIS — R070 Pain in throat: Secondary | ICD-10-CM | POA: Diagnosis not present

## 2020-03-26 DIAGNOSIS — R5381 Other malaise: Secondary | ICD-10-CM | POA: Diagnosis not present

## 2020-03-26 DIAGNOSIS — R519 Headache, unspecified: Secondary | ICD-10-CM | POA: Insufficient documentation

## 2020-03-26 DIAGNOSIS — F10930 Alcohol use, unspecified with withdrawal, uncomplicated: Secondary | ICD-10-CM

## 2020-03-26 DIAGNOSIS — F10230 Alcohol dependence with withdrawal, uncomplicated: Secondary | ICD-10-CM | POA: Diagnosis not present

## 2020-03-26 DIAGNOSIS — R112 Nausea with vomiting, unspecified: Secondary | ICD-10-CM | POA: Diagnosis present

## 2020-03-26 LAB — COMPREHENSIVE METABOLIC PANEL
ALT: 54 U/L — ABNORMAL HIGH (ref 0–44)
AST: 40 U/L (ref 15–41)
Albumin: 4.6 g/dL (ref 3.5–5.0)
Alkaline Phosphatase: 56 U/L (ref 38–126)
Anion gap: 22 — ABNORMAL HIGH (ref 5–15)
BUN: 18 mg/dL (ref 6–20)
CO2: 25 mmol/L (ref 22–32)
Calcium: 9.9 mg/dL (ref 8.9–10.3)
Chloride: 92 mmol/L — ABNORMAL LOW (ref 98–111)
Creatinine, Ser: 1.56 mg/dL — ABNORMAL HIGH (ref 0.61–1.24)
GFR, Estimated: 58 mL/min — ABNORMAL LOW (ref >60)
Glucose, Bld: 162 mg/dL — ABNORMAL HIGH (ref 70–99)
Potassium: 4.3 mmol/L (ref 3.5–5.1)
Sodium: 139 mmol/L (ref 135–145)
Total Bilirubin: 1.4 mg/dL — ABNORMAL HIGH (ref 0.3–1.2)
Total Protein: 8.6 g/dL — ABNORMAL HIGH (ref 6.5–8.1)

## 2020-03-26 LAB — CBC
HCT: 37.2 % — ABNORMAL LOW (ref 39.0–52.0)
Hemoglobin: 12.3 g/dL — ABNORMAL LOW (ref 13.0–17.0)
MCH: 28.6 pg (ref 26.0–34.0)
MCHC: 33.1 g/dL (ref 30.0–36.0)
MCV: 86.5 fL (ref 80.0–100.0)
Platelets: 405 10*3/uL — ABNORMAL HIGH (ref 150–400)
RBC: 4.3 MIL/uL (ref 4.22–5.81)
RDW: 13.2 % (ref 11.5–15.5)
WBC: 12.7 10*3/uL — ABNORMAL HIGH (ref 4.0–10.5)
nRBC: 0 % (ref 0.0–0.2)

## 2020-03-26 LAB — TROPONIN I (HIGH SENSITIVITY): Troponin I (High Sensitivity): 6 ng/L (ref <18)

## 2020-03-26 MED ORDER — SODIUM CHLORIDE 0.9 % IV BOLUS
1000.0000 mL | Freq: Once | INTRAVENOUS | Status: AC
  Administered 2020-03-26: 1000 mL via INTRAVENOUS

## 2020-03-26 MED ORDER — ONDANSETRON HCL 4 MG/2ML IJ SOLN
4.0000 mg | Freq: Once | INTRAMUSCULAR | Status: AC
  Administered 2020-03-26: 4 mg via INTRAVENOUS
  Filled 2020-03-26: qty 2

## 2020-03-26 MED ORDER — LORAZEPAM 2 MG/ML IJ SOLN
1.0000 mg | Freq: Once | INTRAMUSCULAR | Status: AC
  Administered 2020-03-26: 1 mg via INTRAVENOUS
  Filled 2020-03-26: qty 1

## 2020-03-26 MED ORDER — ONDANSETRON 4 MG PO TBDP: 4.0000 mg | ORAL_TABLET | Freq: Three times a day (TID) | ORAL | 0 refills | Status: DC | PRN

## 2020-03-26 MED ORDER — CHLORDIAZEPOXIDE HCL 25 MG PO CAPS: ORAL_CAPSULE | ORAL | 0 refills | Status: DC

## 2020-03-26 NOTE — ED Triage Notes (Signed)
Reports n/v all day today.  Unable to keep anything down.  [facility] seen at [facility] on the 11th after passing out at home.  Per ems found to be bradycardic at 40-50 and sbp in 60's.  Hx of heart failure.  Per mom has not been able to take medications due to n/v.

## 2020-03-26 NOTE — ED Notes (Signed)
Pt states nausea since this am. Has had 11 episodes of vomiting since. States is withdrawing from alcohol was seen at [facility] for same two days ago. Was given ativan with relief.

## 2020-03-26 NOTE — ED Provider Notes (Signed)
[facility] Emergency Department Provider Note MRN:  [id number]  Arrival date & time: 03/26/20     Chief Complaint   Nausea   History of Present Illness   Robert [name] is a 39 y.o. year-old male with a history of CHF presenting to the ED with chief complaint of nausea vomiting.  Persistent nausea vomiting for the past day.  Explains that he was in the hospital for dehydration few days ago.  His last alcoholic beverage was 2 days ago.  Denies any cocaine, does endorse occasional THC use.  Apart from persistent vomiting he endorses malaise, fatigue, mild throat discomfort from all the vomiting.  He denies chest pain or shortness of breath, no abdominal pain, no numbness or weakness to the arms or legs.  Thinks that he did fall and maybe hit his head few days ago prior to his admission.  Review of Systems  A complete 10 system review of systems was obtained and all systems are negative except as noted in the HPI and PMH.   Patient's Health History    Past Medical History:  Diagnosis Date  . Alcohol use   . Asthma   . CHF (congestive heart failure) ([place])     Past Surgical History:  Procedure Laterality Date  . ESOPHAGOGASTRODUODENOSCOPY (EGD) WITH PROPOFOL N/A 11/01/2019   Procedure: ESOPHAGOGASTRODUODENOSCOPY (EGD) WITH PROPOFOL;  Surgeon: [name], MD;  Location: [facility] ENDOSCOPY;  Service: Endoscopy;  Laterality: N/A;  . RIGHT/LEFT HEART CATH AND CORONARY ANGIOGRAPHY N/A 10/06/2019   Procedure: RIGHT/LEFT HEART CATH AND CORONARY ANGIOGRAPHY;  Surgeon: [name], MD;  Location: [place] CV LAB;  Service: Cardiovascular;  Laterality: N/A;  . WRIST SURGERY      Family History  Problem Relation Age of Onset  . Heart failure Father   . Diabetes Father   . Kidney disease Father     Social History   Socioeconomic History  . Marital status: Single    Spouse name: Not on file  . Number of children: Not on file  . Years of education: Not  on file  . Highest education level: Not on file  Occupational History  . Occupation: Ecologist  Tobacco Use  . Smoking status: Current Every Day Smoker    Packs/day: 1.00    Years: 24.00    Pack years: 24.00    Types: Cigarettes  . Smokeless tobacco: Never Used  Vaping Use  . Vaping Use: Former  Substance and Sexual Activity  . Alcohol use: Yes    Comment: heavy use since age 72  . Drug use: Yes    Types: Marijuana  . Sexual activity: Not on file  Other Topics Concern  . Not on file  Social History Narrative  . Not on file   Social Determinants of Health   Financial Resource Strain: High Risk  . Difficulty of Paying Living Expenses: Very hard  Food Insecurity: Food Insecurity Present  . Worried About Charity fundraiser in the Last Year: Sometimes true  . Ran Out of Food in the Last Year: Sometimes true  Transportation Needs:   . Lack of Transportation (Medical): Not on file  . Lack of Transportation (Non-Medical): Not on file  Physical Activity:   . Days of Exercise per Week: Not on file  . Minutes of Exercise per Session: Not on file  Stress:   . Feeling of Stress : Not on file  Social Connections:   . Frequency of Communication  with Friends and Family: Not on file  . Frequency of Social Gatherings with Friends and Family: Not on file  . Attends Religious Services: Not on file  . Active Member of Clubs or Organizations: Not on file  . Attends Archivist Meetings: Not on file  . Marital Status: Not on file  Intimate Partner Violence:   . Fear of Current or Ex-Partner: Not on file  . Emotionally Abused: Not on file  . Physically Abused: Not on file  . Sexually Abused: Not on file     Physical Exam   Vitals:   03/26/20 2144 03/26/20 2215  BP: 128/82 117/74  Pulse:  95  Resp: (!) 21 13  Temp:    SpO2: 100% 97%    CONSTITUTIONAL: Well-appearing, NAD NEURO:  Alert and oriented x 3, no focal deficits EYES:  eyes equal and  reactive ENT/NECK:  no LAD, no JVD CARDIO: Regular rate, well-perfused, normal S1 and S2 PULM:  CTAB no wheezing or rhonchi GI/GU:  normal bowel sounds, non-distended, non-tender MSK/SPINE:  No gross deformities, no edema SKIN:  no rash, atraumatic PSYCH:  Appropriate speech and behavior  *Additional and/or pertinent findings included in MDM below  Diagnostic and Interventional Summary    EKG Interpretation  Date/Time:  Sunday March 26 2020 21:44:51 EST Ventricular Rate:  87 PR Interval:    QRS Duration: 91 QT Interval:  511 QTC Calculation: 615 R Axis:   83 Text Interpretation: Sinus rhythm Abnrm T, probable ischemia, anterolateral lds Prolonged QT interval Confirmed by Gerlene Fee [phone]) on 03/26/2020 9:52:57 PM      Labs Reviewed  CBC - Abnormal; Notable for the following components:      Result Value   WBC 12.7 (*)    Hemoglobin 12.3 (*)    HCT 37.2 (*)    Platelets 405 (*)    All other components within normal limits  COMPREHENSIVE METABOLIC PANEL - Abnormal; Notable for the following components:   Chloride 92 (*)    Glucose, Bld 162 (*)    Creatinine, Ser 1.56 (*)    Total Protein 8.6 (*)    ALT 54 (*)    Total Bilirubin 1.4 (*)    GFR, Estimated 58 (*)    Anion gap 22 (*)    All other components within normal limits  TROPONIN I (HIGH SENSITIVITY)    CT HEAD WO CONTRAST  Final Result      Medications  sodium chloride 0.9 % bolus 1,000 mL (1,000 mLs Intravenous New Bag/Given 03/26/20 2149)  ondansetron (ZOFRAN) injection 4 mg (4 mg Intravenous Given 03/26/20 2208)  LORazepam (ATIVAN) injection 1 mg (1 mg Intravenous Given 03/26/20 2211)     Procedures  /  Critical Care Procedures  ED Course and Medical Decision Making  I have reviewed the triage vital signs, the nursing notes, and pertinent available records from the EMR.  Listed above are laboratory and imaging tests that I personally ordered, reviewed, and interpreted and then considered in  my medical decision making (see below for details).  Mildly tremulous but otherwise normal vital signs, no neurological deficits.  Suspect symptoms related to withdrawal.  However patient is anticoagulated and possibly hit his head a few days ago and is now having persistent nausea vomiting, will obtain CT head to exclude intracranial bleeding.  Patient is EKG has some nonspecific changes, worsening T wave inversions, will also obtain troponin.     Work-up is reassuring, negative troponin, CT head negative, labs overall reassuring  with creatinine only slightly increased from prior.  Providing IV fluids, patient is feeling better and requesting food and drink.  Appropriate for discharge patient does have interest in quitting alcohol, providing Librium taper.  We thoroughly discussed the pros and cons of Librium taper and the danger of mixing with alcohol.  Patient's mother was present during this conversation.  Barth Kirks. [place] Small, [place] [email or web address]  Final Clinical Impressions(s) / ED Diagnoses     ICD-10-CM   1. Alcohol withdrawal syndrome without complication ([place])  G38.756     ED Discharge Orders         Ordered    chlordiazePOXIDE (LIBRIUM) 25 MG capsule        03/26/20 2259    ondansetron (ZOFRAN ODT) 4 MG disintegrating tablet  Every 8 hours PRN        03/26/20 2300           Discharge Instructions Discussed with and Provided to Patient:     Discharge Instructions     You were evaluated in the Emergency Department and after careful evaluation, we did not find any emergent condition requiring admission or further testing in the hospital.  Your exam/testing today is overall reassuring.  Symptoms seem to be due to alcohol withdrawal causing vomiting and dehydration.  Please use the Zofran medication as needed for nausea and use the Librium taper as directed to help detox from alcohol.  As we discussed, please do not mix  the Librium with alcohol.  We recommend follow-up with a therapist to discuss your drinking and your mental health.  Please return to the Emergency Department if you experience any worsening of your condition.   Thank you for allowing [place] to be a part of your care.       Maudie Flakes, MD 03/26/20 [phone]

## 2020-03-26 NOTE — Discharge Instructions (Addendum)
You were evaluated in the Emergency Department and after careful evaluation, we did not find any emergent condition requiring admission or further testing in the hospital.  Your exam/testing today is overall reassuring.  Symptoms seem to be due to alcohol withdrawal causing vomiting and dehydration.  Please use the Zofran medication as needed for nausea and use the Librium taper as directed to help detox from alcohol.  As we discussed, please do not mix the Librium with alcohol.  We recommend follow-up with a therapist to discuss your drinking and your mental health.  Please return to the Emergency Department if you experience any worsening of your condition.   Thank you for allowing [place] to be a part of your care.

## 2020-03-27 ENCOUNTER — Telehealth (HOSPITAL_COMMUNITY): Payer: Self-pay | Admitting: Pharmacy Technician

## 2020-03-27 ENCOUNTER — Other Ambulatory Visit (HOSPITAL_COMMUNITY): Payer: Self-pay

## 2020-03-27 MED ORDER — IVABRADINE HCL 5 MG PO TABS
7.5000 mg | ORAL_TABLET | Freq: Two times a day (BID) | ORAL | 5 refills | Status: DC
Start: 2020-03-27 — End: 2020-03-27

## 2020-03-27 MED ORDER — IVABRADINE HCL 7.5 MG PO TABS
7.5000 mg | ORAL_TABLET | Freq: Two times a day (BID) | ORAL | 3 refills | Status: DC
Start: 2020-03-27 — End: 2020-03-27

## 2020-03-27 MED ORDER — IVABRADINE HCL 7.5 MG PO TABS
7.5000 mg | ORAL_TABLET | Freq: Two times a day (BID) | ORAL | 3 refills | Status: DC
Start: 2020-03-27 — End: 2020-10-30

## 2020-03-27 NOTE — Addendum Note (Signed)
Addended by: Malena Edman on: 03/27/2020 12:06 PM   Modules accepted: Orders

## 2020-03-27 NOTE — Telephone Encounter (Signed)
Received a prior authorization for Corlanor. Upon investigation, there is an active PA on file for Corlanor. Patient needed an updated script, he was on 5mg  tablets, taking 1.5 tabs twice daily.   Was able to get Estill Bamberg (RN), to change the prescription to 7.5mg , taking 1 tablet twice daily. This should bypass the need for another prior authorization.   Called and the pharmacy confirmed, no PA needed. They will order the medication for him.  Charlann Boxer, CPhT

## 2020-03-28 LAB — CULTURE, BLOOD (ROUTINE X 2)
Culture: NO GROWTH
Culture: NO GROWTH
Special Requests: ADEQUATE
Special Requests: ADEQUATE

## 2020-03-31 ENCOUNTER — Other Ambulatory Visit (HOSPITAL_COMMUNITY): Payer: Self-pay

## 2020-03-31 NOTE — Progress Notes (Signed)
Paramedicine Encounter    Patient ID: Robert [name], male    DOB: [birth year], 39 y.o.   MRN: [id number]   Patient Care Team: Patient, No Pcp Per as PCP - General ([place]) Skeet Latch, MD as PCP - Cardiology (Cardiology)  Patient Active Problem List   Diagnosis Date Noted  . Hypotension 03/24/2020  . Lactic acidosis 03/24/2020  . Polysubstance abuse ([place]) 03/24/2020  . Alcohol withdrawal ([place]) 03/06/2020  . Nausea & vomiting 03/06/2020  . AKI (acute kidney injury) ([place]) 10/30/2019  . Gastritis 10/30/2019  . Elevated INR 10/30/2019  . 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[place])   . Acute heart failure ([place]) 10/05/2019  . Pulmonary embolism ([place]) 10/04/2019  . Acute pulmonary embolism ([place]) 10/04/2019  . Chronic systolic CHF (congestive heart failure) ([place])   . Inappropriate sinus tachycardia   . Chest pain 11/03/2013  . Tobacco abuse 11/03/2013    Current Outpatient Medications:  .  chlordiazePOXIDE (LIBRIUM) 25 MG capsule, 50mg  PO TID x 1D, then 25-50mg  PO BID X 1D, then 25-50mg  PO QD X 1D, Disp: 10 capsule, Rfl: 0 .  dapagliflozin propanediol (FARXIGA) 10 MG TABS tablet, Take 1 tablet (10 mg total) by mouth daily before breakfast., Disp: 30 tablet, Rfl: 6 .  digoxin (LANOXIN) 0.125 MG tablet, Take 1 tablet (0.125 mg total) by mouth daily., Disp: 30 tablet, Rfl: 5 .  folic acid (FOLVITE) 1 MG tablet, Take 1 tablet (1 mg total) by mouth daily., Disp: 30 tablet, Rfl: 3 .  furosemide (LASIX) 40 MG tablet, Take 1 tablet (40 mg total) by mouth daily. HOLD until 03/26/20, Disp: 30 tablet, Rfl: 3 .  ivabradine (CORLANOR) 7.5 MG TABS tablet, Take 1 tablet (7.5 mg total) by mouth 2 (two) times daily with a meal., Disp: 60 tablet, Rfl: 3 .  Magnesium Oxide (MAG-200) 200 MG TABS, Take 1 tablet (200 mg total) by mouth 2 (two) times daily., Disp: 60 tablet, Rfl: 3 .  ondansetron (ZOFRAN ODT) 4 MG disintegrating tablet, Take 1 tablet (4 mg total) by mouth  every 8 (eight) hours as needed for nausea or vomiting., Disp: 20 tablet, Rfl: 0 .  potassium chloride SA (KLOR-CON) 20 MEQ tablet, Take 1 tablet (20 mEq total) by mouth daily., Disp: 30 tablet, Rfl: 3 .  QUEtiapine (SEROQUEL) 100 MG tablet, Take 150 mg by mouth daily. , Disp: , Rfl:  .  rivaroxaban (XARELTO) 20 MG TABS tablet, Take 1 tablet (20 mg total) by mouth daily., Disp: 30 tablet, Rfl: 0 .  sacubitril-valsartan (ENTRESTO) 49-51 MG, Take 1 tablet by mouth 2 (two) times daily., Disp: 60 tablet, Rfl: 11 .  sertraline (ZOLOFT) 25 MG tablet, Take 1 tablet (25 mg total) by mouth daily., Disp: 30 tablet, Rfl: 2 .  spironolactone (ALDACTONE) 25 MG tablet, Take 1 tablet (25 mg total) by mouth daily. HOLD until 03/26/20, Disp: 90 tablet, Rfl: 3 .  thiamine 100 MG tablet, Take 1 tablet (100 mg total) by mouth daily., Disp: 30 tablet, Rfl: 0 .  traZODone (DESYREL) 50 MG tablet, Take 1 tablet (50 mg total) by mouth at bedtime., Disp: 60 tablet, Rfl: 0 Allergies  Allergen Reactions  . Bactrim [Sulfamethoxazole-Trimethoprim] Rash      Social History   Socioeconomic History  . Marital status: Single    Spouse name: Not on file  . Number of children: Not on file  . Years of education: Not on file  . Highest education level: Not on file  Occupational  History  . Occupation: Ecologist  Tobacco Use  . Smoking status: Current Every Day Smoker    Packs/day: 1.00    Years: 24.00    Pack years: 24.00    Types: Cigarettes  . Smokeless tobacco: Never Used  Vaping Use  . Vaping Use: Former  Substance and Sexual Activity  . Alcohol use: Yes    Comment: heavy use since age 44  . Drug use: Yes    Types: Marijuana  . Sexual activity: Not on file  Other Topics Concern  . Not on file  Social History Narrative  . Not on file   Social Determinants of Health   Financial Resource Strain: High Risk  . Difficulty of Paying Living Expenses: Very hard  Food Insecurity: Food Insecurity  Present  . Worried About Charity fundraiser in the Last Year: Sometimes true  . Ran Out of Food in the Last Year: Sometimes true  Transportation Needs:   . Lack of Transportation (Medical): Not on file  . Lack of Transportation (Non-Medical): Not on file  Physical Activity:   . Days of Exercise per Week: Not on file  . Minutes of Exercise per Session: Not on file  Stress:   . Feeling of Stress : Not on file  Social Connections:   . Frequency of Communication with Friends and Family: Not on file  . Frequency of Social Gatherings with Friends and Family: Not on file  . Attends Religious Services: Not on file  . Active Member of Clubs or Organizations: Not on file  . Attends Archivist Meetings: Not on file  . Marital Status: Not on file  Intimate Partner Violence:   . Fear of Current or Ex-Partner: Not on file  . Emotionally Abused: Not on file  . Physically Abused: Not on file  . Sexually Abused: Not on file    Physical Exam Cardiovascular:     Rate and Rhythm: Normal rate and regular rhythm.     Pulses: Normal pulses.  Pulmonary:     Effort: Pulmonary effort is normal.     Breath sounds: Normal breath sounds.  Musculoskeletal:        General: Normal range of motion.     Right lower leg: No edema.     Left lower leg: No edema.  Skin:    General: Skin is warm and dry.     Capillary Refill: Capillary refill takes less than 2 seconds.  Neurological:     Mental Status: He is alert and oriented to person, place, and time.  Psychiatric:        Mood and Affect: Mood normal.         Future Appointments  Date Time Provider [place]  04/11/2020  9:00 AM [facility] PA/NP [facility] None  05/16/2020  1:30 PM Nche, Charlene Brooke, NP [facility]    BP 128/88 (BP Location: Left Arm, Patient Position: Sitting, Cuff Size: Normal)   Pulse 96   Resp 16   Wt 192 lb (87.1 kg)   SpO2 98%   BMI 27.55 kg/m   Weight yesterday- did not weigh  Last visit weight- 201  lb  Robert [name] was seen at home today and reported feeling unwell. He stated he has been having chest pain, nausea and vomiting for the past two days. Upon assessment he described the pain as a burning sensation radiating up from his abdomen and into his throat. He has been vomiting mostly stomach acid as he has  been unable to eat much. He has missed several doses of his medications over the past week which has led to a flare up of acid reflux. He was instructed to restart all his medications as directed and he could use OTC acid reducer such as pepto-bismol if necessary. He then showed a bottle of acid reducer from the hospital which had been prescribed on 03/16/20. I advised he should take this instead and he should not drink alcohol. He was understanding and agreeable. His medications were verified and his pillbox was refilled. I will follow up next week.   [name], EMT 03/31/20  ACTION: Home visit completed Next visit planned for 1 week

## 2020-04-04 ENCOUNTER — Telehealth (HOSPITAL_COMMUNITY): Payer: Self-pay

## 2020-04-04 NOTE — Telephone Encounter (Signed)
I called Mr rosemond to schedule an appointment. He stated he was working tomorrow and had some things to take care of today. I asked if he would be able to meet after work tomorrow but he state he doesn't get hoe until 17:30. I will drop off the substance abuse councilor papers tomorrow with his mom and ensure that his pillbox is prepared. I will attempt to follow up after the holiday.  [name], EMT 04/04/20

## 2020-04-05 ENCOUNTER — Other Ambulatory Visit (HOSPITAL_COMMUNITY): Payer: Self-pay

## 2020-04-05 NOTE — Progress Notes (Signed)
I arrived at Mr Pacitti' home to deliver substance abuse counseling contact information while he was at work. His mother accepted the paper and stated she would give it to Mr Lumpkin when he returned. While I was there I also refilled his pillbox and ordered a refill of Entresto. It appeared he had been compliant with his medications since Friday but he has not been taking his Seroquel, trazodone or Zoloft. When asked about this via a phone call he stated he has been falling asleep before taking them but would try to remember to take them more regularly. Nothing further was necessary and I will follow up next week.   [name], EMT 04/05/20

## 2020-04-11 ENCOUNTER — Other Ambulatory Visit: Payer: Self-pay

## 2020-04-11 ENCOUNTER — Other Ambulatory Visit (HOSPITAL_COMMUNITY): Payer: Self-pay

## 2020-04-11 ENCOUNTER — Ambulatory Visit (HOSPITAL_COMMUNITY)
Admission: RE | Admit: 2020-04-11 | Discharge: 2020-04-11 | Disposition: A | Discharge status: Home / Self Care | Payer: BC Managed Care – PPO | Source: Ambulatory Visit | Attending: Adult Health | Admitting: Adult Health

## 2020-04-11 VITALS — BP 141/78 | HR 78 | Wt 202.8 lb

## 2020-04-11 DIAGNOSIS — Z7901 Long term (current) use of anticoagulants: Secondary | ICD-10-CM | POA: Diagnosis not present

## 2020-04-11 DIAGNOSIS — Z720 Tobacco use: Secondary | ICD-10-CM

## 2020-04-11 DIAGNOSIS — Z79899 Other long term (current) drug therapy: Secondary | ICD-10-CM | POA: Diagnosis not present

## 2020-04-11 DIAGNOSIS — I5022 Chronic systolic (congestive) heart failure: Secondary | ICD-10-CM | POA: Diagnosis present

## 2020-04-11 DIAGNOSIS — F1010 Alcohol abuse, uncomplicated: Secondary | ICD-10-CM | POA: Diagnosis not present

## 2020-04-11 DIAGNOSIS — I428 Other cardiomyopathies: Secondary | ICD-10-CM | POA: Insufficient documentation

## 2020-04-11 DIAGNOSIS — F17210 Nicotine dependence, cigarettes, uncomplicated: Secondary | ICD-10-CM | POA: Diagnosis not present

## 2020-04-11 DIAGNOSIS — F10129 Alcohol abuse with intoxication, unspecified: Secondary | ICD-10-CM | POA: Diagnosis not present

## 2020-04-11 DIAGNOSIS — F1411 Cocaine abuse, in remission: Secondary | ICD-10-CM | POA: Insufficient documentation

## 2020-04-11 DIAGNOSIS — Z8249 Family history of ischemic heart disease and other diseases of the circulatory system: Secondary | ICD-10-CM | POA: Diagnosis not present

## 2020-04-11 DIAGNOSIS — Z7984 Long term (current) use of oral hypoglycemic drugs: Secondary | ICD-10-CM | POA: Insufficient documentation

## 2020-04-11 DIAGNOSIS — I2699 Other pulmonary embolism without acute cor pulmonale: Secondary | ICD-10-CM | POA: Insufficient documentation

## 2020-04-11 DIAGNOSIS — I5082 Biventricular heart failure: Secondary | ICD-10-CM | POA: Diagnosis not present

## 2020-04-11 DIAGNOSIS — F1410 Cocaine abuse, uncomplicated: Secondary | ICD-10-CM | POA: Diagnosis not present

## 2020-04-11 DIAGNOSIS — I313 Pericardial effusion (noninflammatory): Secondary | ICD-10-CM | POA: Diagnosis not present

## 2020-04-11 LAB — BASIC METABOLIC PANEL
Anion gap: 9 (ref 5–15)
BUN: 25 mg/dL — ABNORMAL HIGH (ref 6–20)
CO2: 26 mmol/L (ref 22–32)
Calcium: 9.4 mg/dL (ref 8.9–10.3)
Chloride: 98 mmol/L (ref 98–111)
Creatinine, Ser: 1.95 mg/dL — ABNORMAL HIGH (ref 0.61–1.24)
GFR, Estimated: 44 mL/min — ABNORMAL LOW (ref >60)
Glucose, Bld: 98 mg/dL (ref 70–99)
Potassium: 5 mmol/L (ref 3.5–5.1)
Sodium: 133 mmol/L — ABNORMAL LOW (ref 135–145)

## 2020-04-11 MED ORDER — CARVEDILOL 3.125 MG PO TABS: 3.1250 mg | ORAL_TABLET | Freq: Two times a day (BID) | ORAL | 11 refills | Status: DC

## 2020-04-11 NOTE — Patient Instructions (Addendum)
START Coreg 3.125 mg, one tab twice a day  Labs today We will only contact you if something comes back abnormal or we need to make some changes. Otherwise no news is good news!  Your physician recommends that you schedule a follow-up appointment in: 4-6 weeks  If you have any questions or concerns before your next appointment please send [place] a message through [place] or call our office at [phone].    TO LEAVE A MESSAGE FOR THE NURSE SELECT OPTION 2, PLEASE LEAVE A MESSAGE INCLUDING: . YOUR NAME . DATE OF BIRTH . CALL BACK NUMBER . REASON FOR CALL**this is important as we prioritize the call backs  YOU WILL RECEIVE A CALL BACK THE SAME DAY AS LONG AS YOU CALL BEFORE 4:00 PM

## 2020-04-11 NOTE — Progress Notes (Signed)
Paramedicine Encounter   Patient ID: Rulon Abdalla , male,   DOB: 28-May-1980,39 y.o.,  MRN: [id number]   Mr Colello was seen in the HF clinic today with Darrick Grinder, NP-C. He reported feeling generally well over the past week and stated he has been compliant with his medications. He reported drinking less than he used to but is still consuming approximately 10 "airplane bottles" per week. He has plans to contacted a substance use counselor, a list or which has been provided to him, but he has not called yet. Per Darrick Grinder, NP-C he is to add carvedilol 3.124 mg BID and discontinue potassium and lasix. I will see him in the morning to ensure these changes are made. Additionally he will need paperwork signed for missing work earlier in the month so I will bring those papers to the clinic following my visit tomorrow.   [name], EMT 04/11/2020

## 2020-04-11 NOTE — Progress Notes (Signed)
ReDS Vest / Clip - 04/11/20 0900      ReDS Vest / Clip   Station Marker D    Ruler Value 34    ReDS Value Range Moderate volume overload    ReDS Actual Value 36

## 2020-04-11 NOTE — Progress Notes (Signed)
ADVANCED HF CLINIC NOTE  PCP: Primary Cardiologist: Dr Haroldine Laws    Reason for F/u: Heart Failrue  HPI: [name] is a 39 y/o male with h/o heavy ETOH and cocaine use, PE,  biventricular biventricular systolic heart failure.  Admitted with acute onset HF with severe biventricular failure in 5/21. EF 15%. RV down. He was drinking at least 2 beers every night and about 12-24 over the weekend. Also drinking 1/5 of liqour every other day. Cath shown normal coronaries. Elevated filling pressures with normal output. Placed on milrinone and diuresed with IV lasix. As he improved milrinone was weaned off . Transitioned oral lasix. 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 CT shows small LLL PE. Placed on xarelto. Started on HF meds.  Discharge weight 178 pounds.   Readmitted by [facility] and discharged 11/02/19. Admitted for mild acute blood loss related anemia. EGD showedno active bleeding but evidence of some gastritis.  He was switched to oral PPI twice daily, H&H remained stable and he did not require any transfusions.  He was instructed to continue Xarelto.   Has been followed by Paramedicine and HF PharmD with med titration.   Continues to have very frequent ED visits. Admitted 10/25-10/28/21 for ETOH withdrawal symptoms and AKI.   Admitted 03/23/2020 with AMS in the setting ETOH abuse. Hypotensive, AKI, and with lactic acidosis. Entresto, spiro, and lasix. All restarted prior to discharge.   Returned to the ED on 03/26/20 with nausea/vomiting. Ff  Today he returns for post hospital HF follow up. Overall feeling fine. Denies SOB/PND/Orthopnea. Appetite ok. No fever or chills. Weight at home has been stable. Continues to drink 10 air plane bottles of liquor weekly. He has not set up counseling for alcohol abuse. Smoking a few cigarettes per day. Taking all medications. Followed HF Paramedicine. 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    Cardiac studies. Echo (03/10/20) EF 30-35% RV normal.  No effusion.  ECHO 10/12/2019 Pericardial effusion remains moderate in size. No tamponade physiology. ECHO 10/05/2019 EF <20%  Prominent trabeculation. Grade III DD  [facility] 10/05/2019 showed normal coronaries, elevated filling pressures with normal output.  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  SH:  Social History   Socioeconomic History  . Marital status: Single    Spouse name: Not on file  . Number of children: Not on file  . Years of education: Not on file  . Highest education level: Not on file  Occupational History  . Occupation: Ecologist  Tobacco Use  . Smoking status: Current Every Day Smoker    Packs/day: 1.00    Years: 24.00    Pack years: 24.00    Types: Cigarettes  . Smokeless tobacco: Never Used  Vaping Use  . Vaping Use: Former  Substance and Sexual Activity  . Alcohol use: Yes    Comment: heavy use since age 42  . Drug use: Yes    Types: Marijuana  . Sexual activity: Not on file  Other Topics Concern  . Not on file  Social History Narrative  . Not on file   Social Determinants of Health   Financial Resource Strain: High Risk  . Difficulty of Paying Living Expenses: Very hard  Food Insecurity: Food Insecurity Present  . Worried About Charity fundraiser in the Last Year: Sometimes true  . Ran Out of Food in the Last  Year: Sometimes true  Transportation Needs:   . Lack of Transportation (Medical): Not on file  . Lack of Transportation (Non-Medical): Not on file  Physical Activity:   . Days of Exercise per Week: Not on file  . Minutes of Exercise per Session: Not on file  Stress:   . Feeling of Stress : Not on file  Social Connections:   . Frequency of Communication with Friends and Family: Not on file  . Frequency of Social Gatherings with Friends and Family: Not on file  . Attends Religious Services: Not on file  .  Active Member of Clubs or Organizations: Not on file  . Attends Archivist Meetings: Not on file  . Marital Status: Not on file  Intimate Partner Violence:   . Fear of Current or Ex-Partner: Not on file  . Emotionally Abused: Not on file  . Physically Abused: Not on file  . Sexually Abused: Not on file    FH:  Family History  Problem Relation Age of Onset  . Heart failure Father   . Diabetes Father   . Kidney disease Father     Past Medical History:  Diagnosis Date  . Alcohol use   . Asthma   . CHF (congestive heart failure) ([facility])     Current Outpatient Medications  Medication Sig Dispense Refill  . dapagliflozin propanediol (FARXIGA) 10 MG TABS tablet Take 1 tablet (10 mg total) by mouth daily before breakfast. 30 tablet 6  . digoxin (LANOXIN) 0.125 MG tablet Take 1 tablet (0.125 mg total) by mouth daily. 30 tablet 5  . folic acid (FOLVITE) 1 MG tablet Take 1 tablet (1 mg total) by mouth daily. 30 tablet 3  . furosemide (LASIX) 40 MG tablet Take 1 tablet (40 mg total) by mouth daily. HOLD until 03/26/20 30 tablet 3  . ivabradine (CORLANOR) 7.5 MG TABS tablet Take 1 tablet (7.5 mg total) by mouth 2 (two) times daily with a meal. 60 tablet 3  . Magnesium Oxide (MAG-200) 200 MG TABS Take 1 tablet (200 mg total) by mouth 2 (two) times daily. 60 tablet 3  . ondansetron (ZOFRAN ODT) 4 MG disintegrating tablet Take 1 tablet (4 mg total) by mouth every 8 (eight) hours as needed for nausea or vomiting. 20 tablet 0  . potassium chloride SA (KLOR-CON) 20 MEQ tablet Take 1 tablet (20 mEq total) by mouth daily. 30 tablet 3  . QUEtiapine (SEROQUEL) 100 MG tablet Take 150 mg by mouth daily.     . rivaroxaban (XARELTO) 20 MG TABS tablet Take 1 tablet (20 mg total) by mouth daily. 30 tablet 0  . sacubitril-valsartan (ENTRESTO) 49-51 MG Take 1 tablet by mouth 2 (two) times daily. 60 tablet 11  . sertraline (ZOLOFT) 25 MG tablet Take 1 tablet (25 mg total) by mouth daily. 30 tablet 2   . spironolactone (ALDACTONE) 25 MG tablet Take 1 tablet (25 mg total) by mouth daily. HOLD until 03/26/20 90 tablet 3  . thiamine 100 MG tablet Take 1 tablet (100 mg total) by mouth daily. 30 tablet 0  . traZODone (DESYREL) 50 MG tablet Take 1 tablet (50 mg total) by mouth at bedtime. 60 tablet 0   No current facility-administered medications for this encounter.   Wt Readings from Last 3 Encounters:  04/11/20 92 kg (202 lb 12.8 oz)  03/31/20 87.1 kg (192 lb)  03/26/20 91.2 kg (201 lb 1.6 oz)    Vitals:   04/11/20 0910  BP: (!) 141/78  Pulse: 78  SpO2: 98%  Weight: 92 kg (202 lb 12.8 oz)    Reds Clip 36%   General:  Well appearing. No resp difficulty HEENT: normal Neck: supple. no JVD. Carotids 2+ bilat; no bruits. No lymphadenopathy or thryomegaly appreciated. Cor: PMI nondisplaced. Regular rate & rhythm. No rubs, gallops or murmurs. Lungs: clear Abdomen: soft, nontender, nondistended. No hepatosplenomegaly. No bruits or masses. Good bowel sounds. Extremities: no cyanosis, clubbing, rash, edema Neuro: alert & orientedx3, cranial nerves grossly intact. moves all 4 extremities w/o difficulty. Affect pleasant  ASSESSMENT & PLAN: 1.Chronic Systolic Heart Failure, NICM  - ECHO 5/21 w/ severely reduced EF 15% RV moderately reduced. Suspect ETOH and cocaine playing a role.  - LHC with no coronary disease. 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 Pending referral for genetic testing - Echo 03/10/20 ---> EF 30-35% RV normal. No effusion. Personally reviewed -NYHA II. Volume status stable. Continue lasix 40 daily with kcl 20 daily.  - Add coreg 3/125 mg twice a day.  - Continue spiro 25 mg daily.  -Continue digoxin 0.125 mg daily  - Continue Entresto 49/51 bid - Continue Corlanor to 7.5 mg bid.  - Continue Farxiga 10  - Check BMET  -  not a candidate for advanced therapies w/ polysubstance abuse  -  Not ICD  candidate with ongoing substance abuse - Check BMET   2.Pulmonary Emboli - CTA w/ small LLL PE 5/21 - LE Venous dopplers negative for DVT  - Continue Xarelto. No bleeding - Check CBC   3. ETOH Abuse - Heavy drinker for many years since the age of 74. Recently had detox at [facility] but started drinking again.  - SW following. He was given a list to set up counseling.  - Discussed importance of alcohol cessation.   5. H/O Cocaine Abuse - No use since discharge.  - most recent UDS 10/31/19 negative  - No recent cocaine use.   6. Pericardial Effusion - moderate circumferential effusion on cMRI. - resolved on echo 02/2020   7. Tobacco Abuse - Discussed need for cessation  Follow up in 4 weeks. Plan to check ECHO in February after HF meds further optimized. Continue HF Paramedicine.  Greater than 50% of the (total minutes 25) visit spent in counseling/coordination of care regarding the above.     Caleigha Zale NP-C  9:16 AM

## 2020-04-12 ENCOUNTER — Other Ambulatory Visit (HOSPITAL_COMMUNITY): Payer: Self-pay

## 2020-04-12 NOTE — Progress Notes (Signed)
Robert [name] was seen a home today and reported feeling well. The purpose of today's visit was to amend his pillbox with the changes made following his appointment at the HF clinic yesterday. He was made aware of the changes and expressed understanding. I will follow up next week unless if becomes necessary sooner.   [name], EMT 04/12/20

## 2020-04-18 ENCOUNTER — Telehealth (HOSPITAL_COMMUNITY): Payer: Self-pay | Admitting: Cardiology

## 2020-04-18 NOTE — Telephone Encounter (Signed)
Pt aware via zack with paramedicine  Repeat labs 12/15

## 2020-04-18 NOTE — Telephone Encounter (Signed)
-----   Message from Conrad Greenevers, NP sent at 04/11/2020 12:03 PM EST ----- Renal function elevated. Please call. . Stop potassium and lasix. Repeat BMET next week.

## 2020-04-19 ENCOUNTER — Telehealth (HOSPITAL_COMMUNITY): Payer: Self-pay

## 2020-04-19 NOTE — Telephone Encounter (Signed)
I called Mr Jurgens to schedule an appointment. He stated he was working everyday between now and next Tuesday but could meet Tuesday morning. We scheduled a visit for 09:00 on Tuesday morning.   [name], EMT 04/19/20

## 2020-04-25 ENCOUNTER — Other Ambulatory Visit (HOSPITAL_COMMUNITY): Payer: Self-pay | Admitting: *Deleted

## 2020-04-25 ENCOUNTER — Other Ambulatory Visit (HOSPITAL_COMMUNITY): Payer: Self-pay

## 2020-04-25 DIAGNOSIS — I5022 Chronic systolic (congestive) heart failure: Secondary | ICD-10-CM

## 2020-04-25 NOTE — Progress Notes (Signed)
Paramedicine Encounter    Patient ID: Robert [name], male    DOB: 1980/05/31, 39 y.o.   MRN: [id number]   Patient Care Team: Patient, No Pcp Per as PCP - General ([place]) Skeet Latch, MD as PCP - Cardiology (Cardiology)  Patient Active Problem List   Diagnosis Date Noted  . Hypotension 03/24/2020  . Lactic acidosis 03/24/2020  . Polysubstance abuse ([place]) 03/24/2020  . Alcohol withdrawal ([place]) 03/06/2020  . Nausea & vomiting 03/06/2020  . AKI (acute kidney injury) ([place]) 10/30/2019  . Gastritis 10/30/2019  . Elevated INR 10/30/2019  . 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[place])   . Acute heart failure ([place]) 10/05/2019  . Pulmonary embolism ([place]) 10/04/2019  . Acute pulmonary embolism ([place]) 10/04/2019  . Chronic systolic CHF (congestive heart failure) ([place])   . Inappropriate sinus tachycardia   . Chest pain 11/03/2013  . Tobacco abuse 11/03/2013    Current Outpatient Medications:  .  carvedilol (COREG) 3.125 MG tablet, Take 1 tablet (3.125 mg total) by mouth 2 (two) times daily., Disp: 60 tablet, Rfl: 11 .  dapagliflozin propanediol (FARXIGA) 10 MG TABS tablet, Take 1 tablet (10 mg total) by mouth daily before breakfast., Disp: 30 tablet, Rfl: 6 .  digoxin (LANOXIN) 0.125 MG tablet, Take 1 tablet (0.125 mg total) by mouth daily., Disp: 30 tablet, Rfl: 5 .  folic acid (FOLVITE) 1 MG tablet, Take 1 tablet (1 mg total) by mouth daily., Disp: 30 tablet, Rfl: 3 .  ivabradine (CORLANOR) 7.5 MG TABS tablet, Take 1 tablet (7.5 mg total) by mouth 2 (two) times daily with a meal., Disp: 60 tablet, Rfl: 3 .  Magnesium Oxide (MAG-200) 200 MG TABS, Take 1 tablet (200 mg total) by mouth 2 (two) times daily., Disp: 60 tablet, Rfl: 3 .  ondansetron (ZOFRAN ODT) 4 MG disintegrating tablet, Take 1 tablet (4 mg total) by mouth every 8 (eight) hours as needed for nausea or vomiting., Disp: 20 tablet, Rfl: 0 .  QUEtiapine (SEROQUEL) 100 MG tablet, Take 150 mg by  mouth daily. , Disp: , Rfl:  .  rivaroxaban (XARELTO) 20 MG TABS tablet, Take 1 tablet (20 mg total) by mouth daily., Disp: 30 tablet, Rfl: 0 .  sacubitril-valsartan (ENTRESTO) 49-51 MG, Take 1 tablet by mouth 2 (two) times daily., Disp: 60 tablet, Rfl: 11 .  sertraline (ZOLOFT) 25 MG tablet, Take 1 tablet (25 mg total) by mouth daily., Disp: 30 tablet, Rfl: 2 .  spironolactone (ALDACTONE) 25 MG tablet, Take 1 tablet (25 mg total) by mouth daily. HOLD until 03/26/20, Disp: 90 tablet, Rfl: 3 .  thiamine 100 MG tablet, Take 1 tablet (100 mg total) by mouth daily., Disp: 30 tablet, Rfl: 0 .  traZODone (DESYREL) 50 MG tablet, Take 1 tablet (50 mg total) by mouth at bedtime., Disp: 60 tablet, Rfl: 0 Allergies  Allergen Reactions  . Bactrim [Sulfamethoxazole-Trimethoprim] Rash      Social History   Socioeconomic History  . Marital status: Single    Spouse name: Not on file  . Number of children: Not on file  . Years of education: Not on file  . Highest education level: Not on file  Occupational History  . Occupation: Ecologist  Tobacco Use  . Smoking status: Current Every Day Smoker    Packs/day: 1.00    Years: 24.00    Pack years: 24.00    Types: Cigarettes  . Smokeless tobacco: Never Used  Vaping Use  . Vaping Use:  Former  Substance and Sexual Activity  . Alcohol use: Yes    Comment: heavy use since age 46  . Drug use: Yes    Types: Marijuana  . Sexual activity: Not on file  Other Topics Concern  . Not on file  Social History Narrative  . Not on file   Social Determinants of Health   Financial Resource Strain: High Risk  . Difficulty of Paying Living Expenses: Very hard  Food Insecurity: Food Insecurity Present  . Worried About Charity fundraiser in the Last Year: Sometimes true  . Ran Out of Food in the Last Year: Sometimes true  Transportation Needs: Not on file  Physical Activity: Not on file  Stress: Not on file  Social Connections: Not on file   Intimate Partner Violence: Not on file    Physical Exam Cardiovascular:     Rate and Rhythm: Normal rate and regular rhythm.     Pulses: Normal pulses.  Pulmonary:     Effort: Pulmonary effort is normal.     Breath sounds: Normal breath sounds.  Musculoskeletal:        General: Normal range of motion.     Right lower leg: No edema.     Left lower leg: No edema.  Skin:    General: Skin is warm and dry.     Capillary Refill: Capillary refill takes less than 2 seconds.  Neurological:     Mental Status: He is alert and oriented to person, place, and time.  Psychiatric:        Mood and Affect: Mood normal.         Future Appointments  Date Time Provider [place]  04/26/2020  1:45 PM [facility] LAB [facility] None  05/15/2020  9:30 AM [facility] PA/NP [facility] None  05/16/2020  1:30 PM Nche, Charlene Brooke, NP [facility]    BP 103/71 (BP Location: Left Arm, Patient Position: Sitting, Cuff Size: Normal)   Pulse 92   Resp 16   Wt 194 lb (88 kg)   SpO2 100%   BMI 27.84 kg/m   Weight yesterday- did not weigh  Last visit weight- 202 lb  Robert [name] was seen at home today and reported feeling generally well. He stated he has not had a drink in 4-5 days but has been experiencing withdrawal symptoms. Primarily nausea and vomiting for the first three days but these symptoms have subsided at this time. He denied chest pain, SOB, headache, dizziness, orthopnea, fever or cough over the past week. He has been compliant with his medications and his weight has been trending down. His medications were verified and his pillbox was refilled. I will follow up next week.    [name], EMT 04/25/20  ACTION: Home visit completed Next visit planned for 1 week

## 2020-04-26 ENCOUNTER — Other Ambulatory Visit: Payer: Self-pay

## 2020-04-26 ENCOUNTER — Ambulatory Visit (HOSPITAL_COMMUNITY)
Admission: RE | Admit: 2020-04-26 | Discharge: 2020-04-26 | Disposition: A | Discharge status: Home / Self Care | Payer: BC Managed Care – PPO | Source: Ambulatory Visit | Attending: Internal Medicine | Admitting: Internal Medicine

## 2020-04-26 DIAGNOSIS — I5022 Chronic systolic (congestive) heart failure: Secondary | ICD-10-CM

## 2020-04-26 LAB — BASIC METABOLIC PANEL
Anion gap: 10 (ref 5–15)
BUN: 41 mg/dL — ABNORMAL HIGH (ref 6–20)
CO2: 25 mmol/L (ref 22–32)
Calcium: 9.3 mg/dL (ref 8.9–10.3)
Chloride: 98 mmol/L (ref 98–111)
Creatinine, Ser: 1.55 mg/dL — ABNORMAL HIGH (ref 0.61–1.24)
GFR, Estimated: 58 mL/min — ABNORMAL LOW (ref >60)
Glucose, Bld: 108 mg/dL — ABNORMAL HIGH (ref 70–99)
Potassium: 4.3 mmol/L (ref 3.5–5.1)
Sodium: 133 mmol/L — ABNORMAL LOW (ref 135–145)

## 2020-04-27 ENCOUNTER — Other Ambulatory Visit: Payer: BC Managed Care – PPO

## 2020-04-27 DIAGNOSIS — Z20822 Contact with and (suspected) exposure to covid-19: Secondary | ICD-10-CM

## 2020-04-28 LAB — SARS-COV-2, NAA 2 DAY TAT

## 2020-04-28 LAB — NOVEL CORONAVIRUS, NAA: SARS-CoV-2, NAA: NOT DETECTED

## 2020-05-05 ENCOUNTER — Other Ambulatory Visit (HOSPITAL_COMMUNITY): Payer: Self-pay

## 2020-05-05 NOTE — Progress Notes (Signed)
Paramedicine Encounter    Patient ID: Robert [name], male    DOB: 11-07-1980, 39 y.o.   MRN: [id number]   Patient Care Team: Patient, No Pcp Per as PCP - General ([name]) Skeet Latch, MD as PCP - Cardiology (Cardiology)  Patient Active Problem List   Diagnosis Date Noted  . Hypotension 03/24/2020  . Lactic acidosis 03/24/2020  . Polysubstance abuse ([place]) 03/24/2020  . Alcohol withdrawal ([place]) 03/06/2020  . Nausea & vomiting 03/06/2020  . AKI (acute kidney injury) ([place]) 10/30/2019  . Gastritis 10/30/2019  . Elevated INR 10/30/2019  . 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[place])   . Acute heart failure ([place]) 10/05/2019  . Pulmonary embolism ([place]) 10/04/2019  . Acute pulmonary embolism ([place]) 10/04/2019  . Chronic systolic CHF (congestive heart failure) ([place])   . Inappropriate sinus tachycardia   . Chest pain 11/03/2013  . Tobacco abuse 11/03/2013    Current Outpatient Medications:  .  carvedilol (COREG) 3.125 MG tablet, Take 1 tablet (3.125 mg total) by mouth 2 (two) times daily., Disp: 60 tablet, Rfl: 11 .  dapagliflozin propanediol (FARXIGA) 10 MG TABS tablet, Take 1 tablet (10 mg total) by mouth daily before breakfast., Disp: 30 tablet, Rfl: 6 .  digoxin (LANOXIN) 0.125 MG tablet, Take 1 tablet (0.125 mg total) by mouth daily., Disp: 30 tablet, Rfl: 5 .  folic acid (FOLVITE) 1 MG tablet, Take 1 tablet (1 mg total) by mouth daily., Disp: 30 tablet, Rfl: 3 .  ivabradine (CORLANOR) 7.5 MG TABS tablet, Take 1 tablet (7.5 mg total) by mouth 2 (two) times daily with a meal., Disp: 60 tablet, Rfl: 3 .  ondansetron (ZOFRAN ODT) 4 MG disintegrating tablet, Take 1 tablet (4 mg total) by mouth every 8 (eight) hours as needed for nausea or vomiting., Disp: 20 tablet, Rfl: 0 .  QUEtiapine (SEROQUEL) 100 MG tablet, Take 150 mg by mouth daily. , Disp: , Rfl:  .  rivaroxaban (XARELTO) 20 MG TABS tablet, Take 1 tablet (20 mg total) by mouth daily., Disp: 30  tablet, Rfl: 0 .  sacubitril-valsartan (ENTRESTO) 49-51 MG, Take 1 tablet by mouth 2 (two) times daily., Disp: 60 tablet, Rfl: 11 .  sertraline (ZOLOFT) 25 MG tablet, Take 1 tablet (25 mg total) by mouth daily., Disp: 30 tablet, Rfl: 2 .  spironolactone (ALDACTONE) 25 MG tablet, Take 1 tablet (25 mg total) by mouth daily. HOLD until 03/26/20, Disp: 90 tablet, Rfl: 3 .  thiamine 100 MG tablet, Take 1 tablet (100 mg total) by mouth daily., Disp: 30 tablet, Rfl: 0 .  traZODone (DESYREL) 50 MG tablet, Take 1 tablet (50 mg total) by mouth at bedtime., Disp: 60 tablet, Rfl: 0 .  Magnesium Oxide (MAG-200) 200 MG TABS, Take 1 tablet (200 mg total) by mouth 2 (two) times daily. (Patient not taking: Reported on 05/05/2020), Disp: 60 tablet, Rfl: 3 Allergies  Allergen Reactions  . Bactrim [Sulfamethoxazole-Trimethoprim] Rash      Social History   Socioeconomic History  . Marital status: Single    Spouse name: Not on file  . Number of children: Not on file  . Years of education: Not on file  . Highest education level: Not on file  Occupational History  . Occupation: Ecologist  Tobacco Use  . Smoking status: Current Every Day Smoker    Packs/day: 1.00    Years: 24.00    Pack years: 24.00    Types: Cigarettes  . Smokeless tobacco: Never Used  Vaping Use  . Vaping Use: Former  Substance and Sexual Activity  . Alcohol use: Yes    Comment: heavy use since age 35  . Drug use: Yes    Types: Marijuana  . Sexual activity: Not on file  Other Topics Concern  . Not on file  Social History Narrative  . Not on file   Social Determinants of Health   Financial Resource Strain: High Risk  . Difficulty of Paying Living Expenses: Very hard  Food Insecurity: Food Insecurity Present  . Worried About Charity fundraiser in the Last Year: Sometimes true  . Ran Out of Food in the Last Year: Sometimes true  Transportation Needs: Not on file  Physical Activity: Not on file  Stress: Not on  file  Social Connections: Not on file  Intimate Partner Violence: Not on file    Physical Exam Cardiovascular:     Rate and Rhythm: Normal rate and regular rhythm.     Pulses: Normal pulses.  Pulmonary:     Effort: Pulmonary effort is normal.     Breath sounds: Normal breath sounds.  Musculoskeletal:        General: Normal range of motion.     Right lower leg: No edema.     Left lower leg: No edema.  Skin:    General: Skin is warm and dry.     Capillary Refill: Capillary refill takes less than 2 seconds.  Neurological:     Mental Status: He is alert and oriented to person, place, and time.  Psychiatric:        Mood and Affect: Mood normal.         Future Appointments  Date Time Provider [place]  05/15/2020  9:30 AM [facility] PA/NP [facility] None  05/16/2020  1:30 PM Nche, Charlene Brooke, NP [facility]    BP (!) 139/96 (BP Location: Left Arm, Patient Position: Sitting, Cuff Size: Normal)   Pulse 73   Resp 16   Wt 196 lb 3.2 oz (89 kg)   SpO2 98%   BMI 28.15 kg/m   Weight yesterday- did not weigh  Last visit weight- 194 lb  Robert [name] was seen at home today and reported feeing well. He denied chest pain, SOB, headache, dizziness, orthopnea, fever or cough. He stated he has been compliant with his medications over the past week (with the exception of missing yesterday evening and this morning) and his weight has been generally stable. As a result of his last two doses being missed he was hypertensive. His medications were verified and his pillbox was refilled. I will follow up next week.   [name], EMT 05/05/20  ACTION: Home visit completed Next visit planned for 1 week

## 2020-05-11 ENCOUNTER — Telehealth (HOSPITAL_COMMUNITY): Payer: Self-pay

## 2020-05-11 NOTE — Telephone Encounter (Signed)
Mr Latin called me back after missing my call this morning. He stated he would be able to meet me tomorrow morning. So we agreed to meet at 09:00.   [name], EMT 05/11/20

## 2020-05-12 ENCOUNTER — Other Ambulatory Visit (HOSPITAL_COMMUNITY): Payer: Self-pay

## 2020-05-12 NOTE — Progress Notes (Signed)
Paramedicine Encounter    Patient ID: Robert [name], male    DOB: [birth year], 39 y.o.   MRN: [id number]   Patient Care Team: Patient, No Pcp Per as PCP - General ([place]) Robert Latch, MD as PCP - Cardiology (Cardiology)  Patient Active Problem List   Diagnosis Date Noted  . Hypotension 03/24/2020  . Lactic acidosis 03/24/2020  . Polysubstance abuse ([place]) 03/24/2020  . Alcohol withdrawal ([place]) 03/06/2020  . Nausea & vomiting 03/06/2020  . AKI (acute kidney injury) ([place]) 10/30/2019  . Gastritis 10/30/2019  . Elevated INR 10/30/2019  . 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[place])   . Acute heart failure ([place]) 10/05/2019  . Pulmonary embolism ([place]) 10/04/2019  . Acute pulmonary embolism ([place]) 10/04/2019  . Chronic systolic CHF (congestive heart failure) ([place])   . Inappropriate sinus tachycardia   . Chest pain 11/03/2013  . Tobacco abuse 11/03/2013    Current Outpatient Medications:  .  carvedilol (COREG) 3.125 MG tablet, Take 1 tablet (3.125 mg total) by mouth 2 (two) times daily., Disp: 60 tablet, Rfl: 11 .  dapagliflozin propanediol (FARXIGA) 10 MG TABS tablet, Take 1 tablet (10 mg total) by mouth daily before breakfast., Disp: 30 tablet, Rfl: 6 .  digoxin (LANOXIN) 0.125 MG tablet, Take 1 tablet (0.125 mg total) by mouth daily., Disp: 30 tablet, Rfl: 5 .  folic acid (FOLVITE) 1 MG tablet, Take 1 tablet (1 mg total) by mouth daily., Disp: 30 tablet, Rfl: 3 .  ivabradine (CORLANOR) 7.5 MG TABS tablet, Take 1 tablet (7.5 mg total) by mouth 2 (two) times daily with a meal., Disp: 60 tablet, Rfl: 3 .  Magnesium Oxide (MAG-200) 200 MG TABS, Take 1 tablet (200 mg total) by mouth 2 (two) times daily. (Patient not taking: Reported on 05/05/2020), Disp: 60 tablet, Rfl: 3 .  ondansetron (ZOFRAN ODT) 4 MG disintegrating tablet, Take 1 tablet (4 mg total) by mouth every 8 (eight) hours as needed for nausea or vomiting., Disp: 20 tablet, Rfl: 0 .   QUEtiapine (SEROQUEL) 100 MG tablet, Take 150 mg by mouth daily. , Disp: , Rfl:  .  rivaroxaban (XARELTO) 20 MG TABS tablet, Take 1 tablet (20 mg total) by mouth daily., Disp: 30 tablet, Rfl: 0 .  sacubitril-valsartan (ENTRESTO) 49-51 MG, Take 1 tablet by mouth 2 (two) times daily., Disp: 60 tablet, Rfl: 11 .  sertraline (ZOLOFT) 25 MG tablet, Take 1 tablet (25 mg total) by mouth daily., Disp: 30 tablet, Rfl: 2 .  spironolactone (ALDACTONE) 25 MG tablet, Take 1 tablet (25 mg total) by mouth daily. HOLD until 03/26/20, Disp: 90 tablet, Rfl: 3 .  thiamine 100 MG tablet, Take 1 tablet (100 mg total) by mouth daily., Disp: 30 tablet, Rfl: 0 .  traZODone (DESYREL) 50 MG tablet, Take 1 tablet (50 mg total) by mouth at bedtime., Disp: 60 tablet, Rfl: 0 Allergies  Allergen Reactions  . Bactrim [Sulfamethoxazole-Trimethoprim] Rash      Social History   Socioeconomic History  . Marital status: Single    Spouse name: Not on file  . Number of children: Not on file  . Years of education: Not on file  . Highest education level: Not on file  Occupational History  . Occupation: Ecologist  Tobacco Use  . Smoking status: Current Every Day Smoker    Packs/day: 1.00    Years: 24.00    Pack years: 24.00    Types: Cigarettes  . Smokeless tobacco: Never Used  Vaping Use  . Vaping Use: Former  Substance and Sexual Activity  . Alcohol use: Yes    Comment: heavy use since age 48  . Drug use: Yes    Types: Marijuana  . Sexual activity: Not on file  Other Topics Concern  . Not on file  Social History Narrative  . Not on file   Social Determinants of Health   Financial Resource Strain: High Risk  . Difficulty of Paying Living Expenses: Very hard  Food Insecurity: Food Insecurity Present  . Worried About Charity fundraiser in the Last Year: Sometimes true  . Ran Out of Food in the Last Year: Sometimes true  Transportation Needs: Not on file  Physical Activity: Not on file  Stress:  Not on file  Social Connections: Not on file  Intimate Partner Violence: Not on file    Physical Exam Cardiovascular:     Rate and Rhythm: Normal rate and regular rhythm.     Pulses: Normal pulses.  Pulmonary:     Effort: Pulmonary effort is normal.     Breath sounds: Normal breath sounds.  Abdominal:     General: There is no distension.  Musculoskeletal:        General: Normal range of motion.     Right lower leg: No edema.     Left lower leg: No edema.  Skin:    General: Skin is warm and dry.     Capillary Refill: Capillary refill takes less than 2 seconds.  Neurological:     Mental Status: He is alert and oriented to person, place, and time.  Psychiatric:        Mood and Affect: Mood normal.         Future Appointments  Date Time Provider [place]  05/15/2020  9:30 AM [facility] PA/NP [facility] None  05/16/2020  1:30 PM Nche, Charlene Brooke, NP [facility]    BP (!) 137/93 (BP Location: Left Arm, Patient Position: Sitting, Cuff Size: Normal)   Pulse 79   Resp 18   Wt 200 lb (90.7 kg)   SpO2 97%   BMI 28.70 kg/m   Weight yesterday- did not weigh  Last visit weight- 196.2 lb  Robert [name] was seen at home today and reported feeling well. He dneied SOB, headache, dizziness, orthopnea, fever or cough since our last visit. He stated he has had a few episodes of chest pain in the evenings when he lays down but stated they resolve without intervention. He is still drinking regularly, reporting 4-5 drinks per day several days per week. I have provided a list of substance abuse counselors but he has not called any of them at this time. He reported being compliant with his medications over the past week and his weight has been generally stable. His medications were verified and his pillbox was refilled. He did not have Mag-Oxide or vitamin B but he advised he would pick them up when he gets medicine from the pharmacy. He has an appointment next week on Monday at the HF clinic and  Tuesday at his PCP. I reminded him of both appointments and wrote the dates and times down for him on my business card. He assured me he would be at the appointments. I will follow up next week.   Robert [name], EMT 05/12/20  ACTION: Home visit completed Next visit planned for 1 week

## 2020-05-15 ENCOUNTER — Other Ambulatory Visit: Payer: Self-pay

## 2020-05-15 ENCOUNTER — Ambulatory Visit (HOSPITAL_COMMUNITY)
Admission: RE | Admit: 2020-05-15 | Discharge: 2020-05-15 | Disposition: A | Discharge status: Home / Self Care | Payer: BC Managed Care – PPO | Source: Ambulatory Visit | Attending: Cardiology | Admitting: Cardiology

## 2020-05-15 ENCOUNTER — Encounter (HOSPITAL_COMMUNITY): Payer: Self-pay

## 2020-05-15 VITALS — BP 120/90 | HR 73 | Wt 199.0 lb

## 2020-05-15 DIAGNOSIS — F1411 Cocaine abuse, in remission: Secondary | ICD-10-CM | POA: Insufficient documentation

## 2020-05-15 DIAGNOSIS — Z7984 Long term (current) use of oral hypoglycemic drugs: Secondary | ICD-10-CM | POA: Diagnosis not present

## 2020-05-15 DIAGNOSIS — I5022 Chronic systolic (congestive) heart failure: Secondary | ICD-10-CM | POA: Diagnosis present

## 2020-05-15 DIAGNOSIS — I428 Other cardiomyopathies: Secondary | ICD-10-CM | POA: Insufficient documentation

## 2020-05-15 DIAGNOSIS — Z7901 Long term (current) use of anticoagulants: Secondary | ICD-10-CM | POA: Diagnosis not present

## 2020-05-15 DIAGNOSIS — F10129 Alcohol abuse with intoxication, unspecified: Secondary | ICD-10-CM | POA: Insufficient documentation

## 2020-05-15 DIAGNOSIS — I313 Pericardial effusion (noninflammatory): Secondary | ICD-10-CM | POA: Diagnosis not present

## 2020-05-15 DIAGNOSIS — J45909 Unspecified asthma, uncomplicated: Secondary | ICD-10-CM | POA: Diagnosis not present

## 2020-05-15 DIAGNOSIS — Z79899 Other long term (current) drug therapy: Secondary | ICD-10-CM | POA: Diagnosis not present

## 2020-05-15 DIAGNOSIS — F17210 Nicotine dependence, cigarettes, uncomplicated: Secondary | ICD-10-CM | POA: Insufficient documentation

## 2020-05-15 DIAGNOSIS — I2699 Other pulmonary embolism without acute cor pulmonale: Secondary | ICD-10-CM | POA: Insufficient documentation

## 2020-05-15 LAB — CBC
HCT: 36.4 % — ABNORMAL LOW (ref 39.0–52.0)
Hemoglobin: 11.6 g/dL — ABNORMAL LOW (ref 13.0–17.0)
MCH: 28.2 pg (ref 26.0–34.0)
MCHC: 31.9 g/dL (ref 30.0–36.0)
MCV: 88.6 fL (ref 80.0–100.0)
Platelets: 271 10*3/uL (ref 150–400)
RBC: 4.11 MIL/uL — ABNORMAL LOW (ref 4.22–5.81)
RDW: 15.8 % — ABNORMAL HIGH (ref 11.5–15.5)
WBC: 4.5 10*3/uL (ref 4.0–10.5)
nRBC: 0 % (ref 0.0–0.2)

## 2020-05-15 LAB — BASIC METABOLIC PANEL
Anion gap: 14 (ref 5–15)
BUN: 17 mg/dL (ref 6–20)
CO2: 20 mmol/L — ABNORMAL LOW (ref 22–32)
Calcium: 9.2 mg/dL (ref 8.9–10.3)
Chloride: 101 mmol/L (ref 98–111)
Creatinine, Ser: 1.09 mg/dL (ref 0.61–1.24)
GFR, Estimated: >60 mL/min (ref >60)
Glucose, Bld: 100 mg/dL — ABNORMAL HIGH (ref 70–99)
Potassium: 4 mmol/L (ref 3.5–5.1)
Sodium: 135 mmol/L (ref 135–145)

## 2020-05-15 LAB — DIGOXIN LEVEL: Digoxin Level: 1.1 ng/mL (ref 0.8–2.0)

## 2020-05-15 MED ORDER — ENTRESTO 97-103 MG PO TABS
1.0000 | ORAL_TABLET | Freq: Two times a day (BID) | ORAL | 11 refills | Status: DC
Start: 2020-05-15 — End: 2020-06-16

## 2020-05-15 NOTE — Patient Instructions (Signed)
INCREASE EnTRESTO TO 97/103 MG ONE TAB TWICE A DAY  Labs today We will only contact you if something comes back abnormal or we need to make some changes. Otherwise no news is good news!  LABS NEEDED IN 7-10 DAYS  Your physician recommends that you schedule a follow-up appointment in: 3-4 [place]  Your physician recommends that you schedule a follow-up appointment in: [place] [facility]  Your physician has requested that you have an echocardiogram. Echocardiography is a painless test that uses sound waves to create images of your heart. It provides your doctor with information about the size and shape of your heart and how well your heart's chambers and valves are working. This procedure takes approximately one hour. There are no restrictions for this procedure.   If you have any questions or concerns before your next appointment please send [place] a message through [place] or call our office at [phone].    TO LEAVE A MESSAGE FOR THE NURSE SELECT OPTION 2, PLEASE LEAVE A MESSAGE INCLUDING: . YOUR NAME . DATE OF BIRTH . CALL BACK NUMBER . REASON FOR CALL**this is important as we prioritize the call backs  YOU WILL RECEIVE A CALL BACK THE SAME DAY AS LONG AS YOU CALL BEFORE 4:00 PM

## 2020-05-15 NOTE — Progress Notes (Signed)
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  Order form completed and both shipped by [employer] to [facility]. Tracking #[id number]

## 2020-05-15 NOTE — Progress Notes (Signed)
ADVANCED HF CLINIC NOTE  PCP: Primary Cardiologist: Dr Haroldine Laws    Reason for F/u: F/ui for Chronic Systolic Heart Failure   HPI: Mr.Moldovan is a 40 y/o male with h/o heavy ETOH and cocaine use, PE,  biventricular biventricular systolic heart failure.  Admitted with acute onset HF with severe biventricular failure in 5/21. EF 15%. RV down. He was drinking at least 2 beers every night and about 12-24 over the weekend. Also drinking 1/5 of liqour every other day. Cath showed normal coronaries. Elevated filling pressures with normal output. Placed on milrinone and diuresed with IV lasix. As he improved milrinone was weaned off . Transitioned to oral lasix. 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 CT showed small LLL PE. Placed on xarelto. Started on HF meds.  Discharge weight 178 pounds.   Readmitted by [facility] and discharged 11/02/19. Admitted for mild acute blood loss related anemia. EGD showed no active bleeding but evidence of some gastritis.  He was switched to oral PPI twice daily, H&H remained stable and he did not require any transfusions.  He was instructed to continue Xarelto.   Has been followed by Paramedicine and HF PharmD with med titration.   Continues to have very frequent ED visits. Admitted 10/25-10/28/21 for ETOH withdrawal symptoms and AKI.   Admitted 03/23/2020 with AMS in the setting ETOH abuse. Hypotensive, AKI, and with lactic acidosis. Entresto, spiro, and lasix. All restarted prior to discharge.   Returned to the ED on 03/26/20 with nausea/vomiting.   Had AHF follow up on 11/30. Was overall  feeling fine. Denied SOB/PND/Orthopnea. Wt was stable and ReDs Clip was 36%. Admitted to drinking 10 air plane bottles of liquor weekly. Coreg 3.125 mg bid was added to medication regimen. He is due for f/u echo in February.   He returns now for f/u. Tolerating Coreg addition well.  BP 120/90. HR 73 bpm. No orthostatic symptoms. Wt is down 3 lb since last visit, from 202>>199 lb today.  NYHA class II-III. ReDs Clip 27% today. Working at [employer] in [employer].   He reports he has not had ETOH in 5 days. Still smoking 3-4 cigs a day.     Cardiac studies. Echo (03/10/20) EF 30-35% RV normal. No effusion.  ECHO 10/12/2019 Pericardial effusion remains moderate in size. No tamponade physiology. ECHO 10/05/2019 EF <20%  Prominent trabeculation. Grade III DD  [facility] 10/05/2019 showed normal coronaries, elevated filling pressures with normal output.  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  SH:  Social History   Socioeconomic History  . Marital status: Single    Spouse name: Not on file  . Number of children: Not on file  . Years of education: Not on file  . Highest education level: Not on file  Occupational History  . Occupation: Ecologist  Tobacco Use  . Smoking status: Current Every Day Smoker    Packs/day: 1.00    Years: 24.00    Pack years: 24.00    Types: Cigarettes  . Smokeless tobacco: Never Used  Vaping Use  . Vaping Use: Former  Substance and Sexual Activity  . Alcohol use: Yes    Comment: heavy use since age 68  . Drug use: Yes    Types: Marijuana  . Sexual activity: Not on file  Other Topics Concern  . Not on file  Social History Narrative  . Not  on file   Social Determinants of Health   Financial Resource Strain: High Risk  . Difficulty of Paying Living Expenses: Very hard  Food Insecurity: Food Insecurity Present  . Worried About Charity fundraiser in the Last Year: Sometimes true  . Ran Out of Food in the Last Year: Sometimes true  Transportation Needs: Not on file  Physical Activity: Not on file  Stress: Not on file  Social Connections: Not on file  Intimate Partner Violence: Not on file    FH:  Family History  Problem Relation Age of Onset  . Heart failure Father   . Diabetes Father   . Kidney disease  Father     Past Medical History:  Diagnosis Date  . Alcohol use   . Asthma   . CHF (congestive heart failure) (HCC)     Current Outpatient Medications  Medication Sig Dispense Refill  . carvedilol (COREG) 3.125 MG tablet Take 1 tablet (3.125 mg total) by mouth 2 (two) times daily. 60 tablet 11  . dapagliflozin propanediol (FARXIGA) 10 MG TABS tablet Take 1 tablet (10 mg total) by mouth daily before breakfast. 30 tablet 6  . digoxin (LANOXIN) 0.125 MG tablet Take 1 tablet (0.125 mg total) by mouth daily. 30 tablet 5  . folic acid (FOLVITE) 1 MG tablet Take 1 tablet (1 mg total) by mouth daily. 30 tablet 3  . ivabradine (CORLANOR) 7.5 MG TABS tablet Take 1 tablet (7.5 mg total) by mouth 2 (two) times daily with a meal. 60 tablet 3  . Magnesium Oxide (MAG-200) 200 MG TABS Take 1 tablet (200 mg total) by mouth 2 (two) times daily. 60 tablet 3  . ondansetron (ZOFRAN ODT) 4 MG disintegrating tablet Take 1 tablet (4 mg total) by mouth every 8 (eight) hours as needed for nausea or vomiting. 20 tablet 0  . QUEtiapine (SEROQUEL) 100 MG tablet Take 150 mg by mouth daily.     . rivaroxaban (XARELTO) 20 MG TABS tablet Take 1 tablet (20 mg total) by mouth daily. 30 tablet 0  . sacubitril-valsartan (ENTRESTO) 49-51 MG Take 1 tablet by mouth 2 (two) times daily. 60 tablet 11  . spironolactone (ALDACTONE) 25 MG tablet Take 1 tablet (25 mg total) by mouth daily. HOLD until 03/26/20 90 tablet 3  . thiamine 100 MG tablet Take 1 tablet (100 mg total) by mouth daily. 30 tablet 0  . traZODone (DESYREL) 50 MG tablet Take 1 tablet (50 mg total) by mouth at bedtime. 60 tablet 0  . sertraline (ZOLOFT) 25 MG tablet Take 1 tablet (25 mg total) by mouth daily. 30 tablet 2   No current facility-administered medications for this encounter.   Wt Readings from Last 3 Encounters:  05/15/20 90.3 kg (199 lb)  05/12/20 90.7 kg (200 lb)  05/05/20 89 kg (196 lb 3.2 oz)    Vitals:   05/15/20 0936  BP: 120/90  Pulse: 73   SpO2: 98%  Weight: 90.3 kg (199 lb)    Reds Clip:  27%   PHYSICAL EXAM: General: fatigue appearing young male. No respiratory difficulty HEENT: normal Neck: supple. no JVD. Carotids 2+ bilat; no bruits. No lymphadenopathy or thyromegaly appreciated. Cor: PMI nondisplaced. Regular rate & rhythm. No rubs, gallops or murmurs. Lungs: clear Abdomen: soft, nontender, nondistended. No hepatosplenomegaly. No bruits or masses. Good bowel sounds. Extremities: no cyanosis, clubbing, rash, edema Neuro: alert & oriented x 3, cranial nerves grossly intact. moves all 4 extremities w/o difficulty. Affect pleasant.   ASSESSMENT &  PLAN: 1.Chronic Systolic Heart Failure, NICM  - ECHO 5/21 w/ severely reduced EF 15% RV moderately reduced. Suspect ETOH and cocaine playing a role.  - LHC with no coronary disease. 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  - Echo 03/10/20 ---> EF 30-35% RV normal. No effusion.  - NYHA II-III. Volume status stable.  ReDs Clip 27% - Increase Entresto to 97-103 mg bid  - Continue Coreg 3.125 mg bid  - Continue spiro 25 mg daily.  - Continue digoxin 0.125 mg daily, Check Dig level today  - Continue Corlanor 7.5 mg bid.  - Continue Farxiga 10 mg daily  - Check BMP today and again in 7 days  - Refer for genetic testing for LVNC  - Not a candidate for advanced therapies w/ polysubstance abuse  - Not ICD candidate with ongoing substance abuse - Meds nearly optimized. Plan f/u echo in 4-6 more weeks   2.Pulmonary Emboli - CTA w/ small LLL PE 5/21 - LE Venous dopplers negative for DVT  - Continue Xarelto. Check CBC  3. ETOH Abuse - Heavy drinker for many years since the age of 46. Recently had detox at [facility] but started drinking again. He reports today that he has not had a drink in 5 days.  - SW following. He was given a list to set up counseling.  - Discussed importance of alcohol  cessation.   5. H/O Cocaine Abuse - most recent UDS 10/31/19 negative  - denies recent cocaine use.   6. Pericardial Effusion - moderate circumferential effusion on cMRI. - resolved on echo 02/2020   7. Tobacco Abuse - still smoking 3-4 cigs a day.  - Discussed need for complete cessation  8. H/o GIB/ Gastritis w/ ABLA - h/o heavy ETOH abuse + chronic anticoagulation therapy w/ Xarelto - denies melena/ hematochezia  - check CBC today  - continue PPI for GI protection  - discussed abstaining from ETOH   F/u w/ PharmD in 3-4 weeks. F/u w/ Dr. Haroldine Laws in 8 weeks w/ repeat echo at visit.  C/w para medicine     Geoff Dacanay PA-C   9:42 AM

## 2020-05-15 NOTE — Addendum Note (Signed)
Encounter addended by: Kerry Dory, CMA on: 05/15/2020 10:45 AM  Actions taken: Clinical Note Signed

## 2020-05-16 ENCOUNTER — Ambulatory Visit (INDEPENDENT_AMBULATORY_CARE_PROVIDER_SITE_OTHER): Payer: BC Managed Care – PPO | Admitting: Nurse Practitioner

## 2020-05-16 ENCOUNTER — Encounter: Payer: Self-pay | Admitting: Nurse Practitioner

## 2020-05-16 VITALS — BP 115/80 | HR 86 | Temp 98.3°F | Ht 70.75 in | Wt 198.0 lb

## 2020-05-16 DIAGNOSIS — I5022 Chronic systolic (congestive) heart failure: Secondary | ICD-10-CM

## 2020-05-16 DIAGNOSIS — Z13220 Encounter for screening for lipoid disorders: Secondary | ICD-10-CM

## 2020-05-16 DIAGNOSIS — F39 Unspecified mood [affective] disorder: Secondary | ICD-10-CM | POA: Diagnosis not present

## 2020-05-16 DIAGNOSIS — Z136 Encounter for screening for cardiovascular disorders: Secondary | ICD-10-CM

## 2020-05-16 DIAGNOSIS — F17209 Nicotine dependence, unspecified, with unspecified nicotine-induced disorders: Secondary | ICD-10-CM | POA: Diagnosis not present

## 2020-05-16 DIAGNOSIS — I2699 Other pulmonary embolism without acute cor pulmonale: Secondary | ICD-10-CM

## 2020-05-16 NOTE — Progress Notes (Signed)
Subjective:  Patient ID: Robert [name], male    DOB: 07/23/1980  Age: 40 y.o. MRN: [id number]  CC: Establish Care (Pt here to establish care for CHF.  No complaints today. Declines flu vaccine)  HPI  Tobacco abuse No interest in quitting at this time  Chronic systolic CHF (congestive heart failure) (HCC) Secondary polysubstance abuse: ETOH and cocaine. Reports he has been clean from cocaine x 1year. Daily ETOH intake: 2shots of liquor per day. Marijuana: 1blunts 1-2x/week Managed by cardiology: Dr. Jeffie Pollock and [facility]. Last OV 05/15/2020 Also has weekly home visits by EMT. Last home visit 05/12/2020. Does not check weight daily. Reports he is compliant with diet and medications.  Reviewed visit note by EMT, HF clinic, ED visit, and Hospital discharge. Also reviewed lab results and echocardiogram report. Advised about importance to minimize daily ETOH intake, daily weight check and medication compliance.  Mood disorder ([place]) Current use of seroquel and zoloft Declined referral to psychologist. Denies SI/HI  Pulmonary embolism ([place]) Current use of xarelto Denies any GI/GU bleed or bruising. Stable cbc completed 05/16/2019 HF paramedicine BP Readings from Last 3 Encounters:  05/16/20 115/80  05/15/20 120/90  05/12/20 (!) 137/93   Wt Readings from Last 3 Encounters:  05/16/20 198 lb (89.8 kg)  05/15/20 199 lb (90.3 kg)  05/12/20 200 lb (90.7 kg)   Reviewed past Medical, Social and Family history today.  Outpatient Medications Prior to Visit  Medication Sig Dispense Refill  . carvedilol (COREG) 3.125 MG tablet Take 1 tablet (3.125 mg total) by mouth 2 (two) times daily. 60 tablet 11  . dapagliflozin propanediol (FARXIGA) 10 MG TABS tablet Take 1 tablet (10 mg total) by mouth daily before breakfast. 30 tablet 6  . digoxin (LANOXIN) 0.125 MG tablet Take 1 tablet (0.125 mg total) by mouth daily. 30 tablet 5  . folic acid (FOLVITE) 1 MG tablet Take 1 tablet  (1 mg total) by mouth daily. 30 tablet 3  . ivabradine (CORLANOR) 7.5 MG TABS tablet Take 1 tablet (7.5 mg total) by mouth 2 (two) times daily with a meal. 60 tablet 3  . Magnesium Oxide (MAG-200) 200 MG TABS Take 1 tablet (200 mg total) by mouth 2 (two) times daily. 60 tablet 3  . ondansetron (ZOFRAN ODT) 4 MG disintegrating tablet Take 1 tablet (4 mg total) by mouth every 8 (eight) hours as needed for nausea or vomiting. 20 tablet 0  . QUEtiapine (SEROQUEL) 100 MG tablet Take 150 mg by mouth daily.     . sacubitril-valsartan (ENTRESTO) 97-103 MG Take 1 tablet by mouth 2 (two) times daily. 60 tablet 11  . sertraline (ZOLOFT) 25 MG tablet Take 1 tablet (25 mg total) by mouth daily. 30 tablet 2  . spironolactone (ALDACTONE) 25 MG tablet Take 1 tablet (25 mg total) by mouth daily. HOLD until 03/26/20 90 tablet 3  . thiamine 100 MG tablet Take 1 tablet (100 mg total) by mouth daily. 30 tablet 0  . rivaroxaban (XARELTO) 20 MG TABS tablet Take 1 tablet (20 mg total) by mouth daily. 30 tablet 0  . traZODone (DESYREL) 50 MG tablet Take 1 tablet (50 mg total) by mouth at bedtime. 60 tablet 0   No facility-administered medications prior to visit.    ROS See HPI  Objective:  BP 115/80 (BP Location: Right Arm, Patient Position: Sitting, Cuff Size: Normal)   Pulse 86   Temp 98.3 F (36.8 C) (Temporal)   Ht 5' 10.75" (1.797 m)  Wt 198 lb (89.8 kg)   SpO2 98%   BMI 27.81 kg/m   Physical Exam Vitals reviewed.  Cardiovascular:     Rate and Rhythm: Normal rate and regular rhythm.     Pulses: Normal pulses.     Heart sounds: Gallop present.   Pulmonary:     Effort: Pulmonary effort is normal.     Breath sounds: Normal breath sounds.  Musculoskeletal:     Right lower leg: No edema.     Left lower leg: No edema.  Neurological:     Mental Status: He is alert and oriented to person, place, and time.  Psychiatric:        Attention and Perception: Attention normal.        Mood and Affect:  Affect is blunt and flat.        Speech: Speech normal.        Behavior: Behavior normal.        Thought Content: Thought content normal.        Cognition and Memory: Cognition normal.        Judgment: Judgment normal.    Assessment & Plan:  This visit occurred during the SARS-CoV-2 public health emergency.  Safety protocols were in place, including screening questions prior to the visit, additional usage of staff PPE, and extensive cleaning of exam room while observing appropriate contact time as indicated for disinfecting solutions.   Robert [name] was seen today for establish care.  Diagnoses and all orders for this visit:  Chronic systolic CHF (congestive heart failure) ([place])  Encounter for lipid screening for cardiovascular disease -     Lipid panel; Future  Tobacco use disorder, continuous  Mood disorder ([place])  Pulmonary embolism without acute cor pulmonale, unspecified chronicity, unspecified pulmonary embolism type ([place])   Problem List Items Addressed This Visit      Cardiovascular and Mediastinum   Chronic systolic CHF (congestive heart failure) ([place]) - Primary    Secondary polysubstance abuse: ETOH and cocaine. Reports he has been clean from cocaine x 1year. Daily ETOH intake: 2shots of liquor per day. Marijuana: 1blunts 1-2x/week Managed by cardiology: Dr. Jeffie Pollock and [facility]. Last OV 05/15/2020 Also has weekly home visits by EMT. Last home visit 05/12/2020. Does not check weight daily. Reports he is compliant with diet and medications.  Reviewed visit note by EMT, HF clinic, ED visit, and Hospital discharge. Also reviewed lab results and echocardiogram report. Advised about importance to minimize daily ETOH intake, daily weight check and medication compliance.      Pulmonary embolism ([place])    Current use of xarelto Denies any GI/GU bleed or bruising. Stable cbc completed 05/16/2019        Other   Mood disorder ([place])    Current use of seroquel and  zoloft Declined referral to psychologist. Denies SI/HI      Tobacco abuse    No interest in quitting at this time       Other Visit Diagnoses    Encounter for lipid screening for cardiovascular disease       Relevant Orders   Lipid panel      Follow-up: Return in about 3 months (around 08/14/2020) for CPE (fasting).  [name], NP

## 2020-05-16 NOTE — Assessment & Plan Note (Signed)
Current use of seroquel and zoloft Declined referral to psychologist. Denies SI/HI

## 2020-05-16 NOTE — Assessment & Plan Note (Signed)
Current use of xarelto Denies any GI/GU bleed or bruising. Stable cbc completed 05/16/2019

## 2020-05-16 NOTE — Assessment & Plan Note (Addendum)
Secondary polysubstance abuse: ETOH and cocaine. Reports he has been clean from cocaine x 1year. Daily ETOH intake: 2shots of liquor per day. Marijuana: 1blunts 1-2x/week Managed by cardiology: Dr. Jeffie Pollock and [facility]. Last OV 05/15/2020 Also has weekly home visits by EMT. Last home visit 05/12/2020. Does not check weight daily. Reports he is compliant with diet and medications.  Reviewed visit note by EMT, HF clinic, ED visit, and Hospital discharge. Also reviewed lab results and echocardiogram report. Advised about importance to minimize daily ETOH intake, daily weight check and medication compliance.

## 2020-05-16 NOTE — Patient Instructions (Addendum)
You are schedule for COVID test on 05/19/2020 at [facility]. [address]. Wear mask and take your insurance card.  Have lipid panel drawn by cardiology office during your next office visit.  Thank you for choosing [facility] for your health needs.

## 2020-05-16 NOTE — Assessment & Plan Note (Signed)
No interest in quitting at this time

## 2020-05-19 ENCOUNTER — Other Ambulatory Visit: Payer: BC Managed Care – PPO

## 2020-05-19 ENCOUNTER — Telehealth (HOSPITAL_COMMUNITY): Payer: Self-pay

## 2020-05-19 ENCOUNTER — Other Ambulatory Visit (HOSPITAL_COMMUNITY): Payer: Self-pay

## 2020-05-19 NOTE — Progress Notes (Signed)
Mr Majano was seen at home today and reported feeling well. He denied chest pain, SOB, headache, dizziness, orthopnea, fever or cough. The purpose of this visit was to refill his pillbox and ensure the changes were made in accordance with his clinic visit this week. Upon checking his pillbox it was noted that he has missed 2 morning doses of medications and 5 evening doses of medications. He did not take any of his bedtime PRN medications because he has fallen asleep without needing them. His medications were verified and his pillbox was refilled. I will follow up next week.   [name], EMT 05/19/20

## 2020-05-19 NOTE — Telephone Encounter (Signed)
I called Robert [name] to schedule an appointment. He did not answer and did not a his voicemail set up to leave a message. I will try again later.  [name], EMT 05/19/20

## 2020-05-22 NOTE — Progress Notes (Signed)
PCP: Primary Cardiologist: Dr Haroldine Laws    HPI:  Robert [name] is a 40 y/o male with h/oheavyETOH and cocaine use, PE,  biventricular systolic heart failure.  Admitted with acute onset HF with severe biventricular failure in 5/21. EF 15%. RV down. He was drinking at least 2 beers every night and about 12-24 over the weekend. Also drinking 1/5 of liqour every other day. Cath showed normal coronaries. Elevated filling pressures with normal output. Placed on milrinone and diuresed with IV lasix. As he improved milrinone was weaned off . Transitioned to oral lasix. 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 CT showed small LLL PE. Placed on xarelto. Started on HF meds.  Discharge weight 178 pounds.   Readmitted by [facility] and discharged 11/02/19. Admitted for mild acute blood loss related anemia. EGD showed no active bleeding but evidence of some gastritis. He was switched to oral PPI twice daily, H&H remained stable and he did not require any transfusions. He was instructed to continue Xarelto.   Continues to have very frequent ED visits. Admitted 10/25-10/28/21 for ETOH withdrawal symptoms and AKI.   Admitted 03/23/2020 with AMS in the setting ETOH abuse. Hypotensive, AKI, and with lactic acidosis. Entresto, spironolactone, and furosemide. All restarted prior to discharge.   Returned to the ED on 03/26/20 with nausea/vomiting.   Had AHF follow up on 04/11/20. Was overall  feeling fine. Denied SOB/PND/Orthopnea. Weight was stable and ReDs Clip was 36%. Admitted to drinking 10 air plane bottles of liquor weekly. Carvedilol 3.125 mg BID was added to medication regimen. He is due for f/u echo in February.   He recently presented to HF Clinic for follow up on 05/16/19. Was tolerating carvedilol addition well.  BP was 120/90. HR was 73 bpm. No orthostatic symptoms. Weight was down 3 lbs since last visit, from 202>>199 lb today. NYHA class II-III symptoms. ReDs Clip 27% in clinic. Working at [employer] in [employer]. He reported he had not had ETOH in 5 days. Was still smoking 3-4 cigs a day.   Today he returns to HF clinic for pharmacist medication titration. At last visit with APP, Entresto was increased to 97/103 mg BID. Overall he is not feeling well today. Main complaint is right sided pain in his chest. This is musculoskeletal, not cardiac in nature. Believes he pulled a muscle while throwing up or when falling last week. He went to the ED on 06/04/20 for this pain, but left before being seen because the wait was too long. Instructed him to follow up with his PCP for further workup and any pain medications. Does note occasional dizziness, when standing that started this week. His breathing has been good. No SOB/DOE. Weight has been stable. Not taking any diuretic. No LEE, PND or orthopnea. Gets medication help with paramedicine. Was noted to be taking medication out of pill bottles at last paramedicine visit so compliance difficult to assess. Paramedicine spent 20 minutes going over his medications and helping him fill his pill box.    HF Medications: Carvedilol 3.125 mg BID Entresto 97/103 mg BID Spironolactone 25 mg daily Dapagliflozin 10 mg daily Ivabradine 7.5 mg BID  Digoxin 0.125 mg daily Furosemide 40 mg daily  Has the patient been experiencing any side effects to the medications prescribed?  no  Does the patient have any problems obtaining medications due to transportation or finances?   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 Patient understands to avoid decongestants.    Pertinent Lab Values: . Serum creatinine 1.10, BUN 18, Potassium 4.2, Sodium 136  Vital Signs: . Weight: 199 lbs (last clinic weight: 198 lbs) . Blood pressure: 122/84  . Heart rate: 86   Assessment/Plan: 1.Chronic Systolic Heart Failure, NICM   - ECHO 5/21 w/ severely reduced EF 15% RV moderately reduced. Suspect ETOH and cocaine playing a role.  - LHC with no coronary disease. 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  - Echo 03/10/20 ---> EF 30-35% RV function mildly reduced. No effusion.  - NYHA II-III. Volume status stable.   - Will make no medication changes today as patient is not feeling well. Instructed patient to reach out to PCP for musculoskeletal pain.  - Continue carvedilol 3.125 mg BID  - Continue Entresto 97-103 mg BID  - Continue spironolactone 25 mg daily.  - Continue Farxiga 10 mg daily  - Continue digoxin 0.125 mg daily. - Continue Ivabradine 7.5 mg BID.  - Not a candidate for advanced therapies w/ polysubstance abuse  - Not ICD candidate with ongoing substance abuse - Plan f/u echo in February 2022  2.Pulmonary Emboli - CTA w/ small LLL PE 5/21 - LE Venous dopplers negative for DVT  - Continue Xarelto.   3. ETOH Abuse - Heavy drinker for many years since the age of 45. Recently had detox at [facility] but started drinking again. - SW following. He was given a list to set up counseling.  - Discussed importance of alcohol cessation.   5. H/O Cocaine Abuse - most recent UDS 10/31/19 negative  - denies recent cocaine use.   6. Pericardial Effusion - moderate circumferential effusion on cMRI. - resolved on echo 02/2020   7. Tobacco Abuse - still smoking 3-4 cigs a day.  - Discussed need for complete cessation  8. H/o GIB/ Gastritis w/ ABLA - h/o heavy ETOH abuse + chronic anticoagulation therapy w/ Xarelto - denies melena/ hematochezia  - continue PPI for GI protection  - discussed abstaining from [place], PharmD, BCPS, [place], [facility] Pharmacist [phone]

## 2020-05-23 ENCOUNTER — Encounter (HOSPITAL_COMMUNITY): Payer: Self-pay | Admitting: *Deleted

## 2020-05-23 ENCOUNTER — Other Ambulatory Visit (HOSPITAL_COMMUNITY): Payer: BC Managed Care – PPO

## 2020-05-23 NOTE — Progress Notes (Signed)
FMLA forms completed and signed by Dr Haroldine Laws, forms were faxed on 05/09/2020, pt was aware this was done then

## 2020-06-01 ENCOUNTER — Telehealth (HOSPITAL_COMMUNITY): Payer: Self-pay

## 2020-06-01 NOTE — Telephone Encounter (Signed)
I called Mr Crooker to see if he would be available for an appointment with Joellen Jersey or Nira Conn. He did not answer and his voicemail was not set up. I will try to reach out again tomorrow.   [name], EMT 06/01/20

## 2020-06-02 ENCOUNTER — Telehealth (HOSPITAL_COMMUNITY): Payer: Self-pay

## 2020-06-02 NOTE — Telephone Encounter (Signed)
Robert [name] returned my call and advised he has been feeling generally well despite some right sided rib pain, He stated this began while vomiting about a week ago. He reported that he has been taking his medications out of the bottles and was unsure if he was taking them correctly so we spent about 20 minutes going over his medications while he filled his pillbox. He was low on Xarelto and folic acid but had enough to get through the weekend. He stated he would call in refill requests and pick the medications up as soon as possible. He was advised to reach out of he has any issues getting his medications or if he needs assistance with anything and I would have someone come out. He was understanding and agreeable.   [name], EMT 06/02/20

## 2020-06-02 NOTE — Telephone Encounter (Signed)
I called Robert [name] to see when he could be scheduled for an appointment. He did not answer and his mailbox was not set up so no voicemail could be left. I will try again next week.   [name], EMT 06/02/20

## 2020-06-04 ENCOUNTER — Other Ambulatory Visit: Payer: Self-pay

## 2020-06-04 ENCOUNTER — Emergency Department (HOSPITAL_BASED_OUTPATIENT_CLINIC_OR_DEPARTMENT_OTHER): Payer: BC Managed Care – PPO

## 2020-06-04 ENCOUNTER — Emergency Department (HOSPITAL_BASED_OUTPATIENT_CLINIC_OR_DEPARTMENT_OTHER)
Admission: EM | Admit: 2020-06-04 | Discharge: 2020-06-04 | Disposition: A | Discharge status: Home / Self Care | Payer: BC Managed Care – PPO | Attending: Emergency Medicine | Admitting: Emergency Medicine

## 2020-06-04 ENCOUNTER — Encounter (HOSPITAL_BASED_OUTPATIENT_CLINIC_OR_DEPARTMENT_OTHER): Payer: Self-pay | Admitting: Emergency Medicine

## 2020-06-04 DIAGNOSIS — R079 Chest pain, unspecified: Secondary | ICD-10-CM | POA: Diagnosis present

## 2020-06-04 DIAGNOSIS — R112 Nausea with vomiting, unspecified: Secondary | ICD-10-CM | POA: Insufficient documentation

## 2020-06-04 DIAGNOSIS — Z5321 Procedure and treatment not carried out due to patient leaving prior to being seen by health care provider: Secondary | ICD-10-CM | POA: Diagnosis not present

## 2020-06-04 DIAGNOSIS — R531 Weakness: Secondary | ICD-10-CM | POA: Diagnosis not present

## 2020-06-04 DIAGNOSIS — R042 Hemoptysis: Secondary | ICD-10-CM | POA: Insufficient documentation

## 2020-06-04 LAB — BASIC METABOLIC PANEL
Anion gap: 11 (ref 5–15)
BUN: 18 mg/dL (ref 6–20)
CO2: 22 mmol/L (ref 22–32)
Calcium: 9.2 mg/dL (ref 8.9–10.3)
Chloride: 103 mmol/L (ref 98–111)
Creatinine, Ser: 1.1 mg/dL (ref 0.61–1.24)
GFR, Estimated: >60 mL/min (ref >60)
Glucose, Bld: 148 mg/dL — ABNORMAL HIGH (ref 70–99)
Potassium: 4.2 mmol/L (ref 3.5–5.1)
Sodium: 136 mmol/L (ref 135–145)

## 2020-06-04 LAB — CBC
HCT: 36.9 % — ABNORMAL LOW (ref 39.0–52.0)
Hemoglobin: 12.4 g/dL — ABNORMAL LOW (ref 13.0–17.0)
MCH: 29.5 pg (ref 26.0–34.0)
MCHC: 33.6 g/dL (ref 30.0–36.0)
MCV: 87.6 fL (ref 80.0–100.0)
Platelets: 272 10*3/uL (ref 150–400)
RBC: 4.21 MIL/uL — ABNORMAL LOW (ref 4.22–5.81)
RDW: 15.9 % — ABNORMAL HIGH (ref 11.5–15.5)
WBC: 4.8 10*3/uL (ref 4.0–10.5)
nRBC: 0 % (ref 0.0–0.2)

## 2020-06-04 LAB — TROPONIN I (HIGH SENSITIVITY)
Troponin I (High Sensitivity): 5 ng/L (ref <18)
Troponin I (High Sensitivity): 4 ng/L (ref <18)

## 2020-06-04 NOTE — ED Notes (Signed)
Call PT 3 times in the past 3 hours, no response

## 2020-06-04 NOTE — ED Notes (Signed)
Pts mother with patient, endorses concern for patient regarding a syncopal episode on Tuesday. Pt reports normally drinks daily, has not drank in "several days". Pt reports only concern is right side CP

## 2020-06-04 NOTE — ED Triage Notes (Signed)
Pt arrives with driver, c/o right side CP, weakness, N/V and hemoptysis. Pt endorses neg covid test x 1 week pta.

## 2020-06-05 ENCOUNTER — Telehealth (HOSPITAL_COMMUNITY): Payer: Self-pay

## 2020-06-05 NOTE — Telephone Encounter (Signed)
I called Mr Flegel to remind him of his upcoming appointment this week at the HF clinic. He did not answer and his voicemail is not set up so a message was not able to be left. I will try again tomorrow morning before his scheduled appointment.   [name], EMT 06/05/20

## 2020-06-06 ENCOUNTER — Telehealth (HOSPITAL_COMMUNITY): Payer: Self-pay

## 2020-06-06 ENCOUNTER — Ambulatory Visit (HOSPITAL_COMMUNITY)
Admission: RE | Admit: 2020-06-06 | Discharge: 2020-06-06 | Disposition: A | Discharge status: Home / Self Care | Payer: BC Managed Care – PPO | Source: Ambulatory Visit | Attending: Internal Medicine | Admitting: Internal Medicine

## 2020-06-06 ENCOUNTER — Other Ambulatory Visit: Payer: Self-pay

## 2020-06-06 VITALS — BP 122/84 | HR 86 | Wt 199.0 lb

## 2020-06-06 DIAGNOSIS — I5082 Biventricular heart failure: Secondary | ICD-10-CM | POA: Diagnosis not present

## 2020-06-06 DIAGNOSIS — F17210 Nicotine dependence, cigarettes, uncomplicated: Secondary | ICD-10-CM | POA: Diagnosis not present

## 2020-06-06 DIAGNOSIS — I5022 Chronic systolic (congestive) heart failure: Secondary | ICD-10-CM

## 2020-06-06 DIAGNOSIS — Z7901 Long term (current) use of anticoagulants: Secondary | ICD-10-CM | POA: Diagnosis not present

## 2020-06-06 DIAGNOSIS — Z79899 Other long term (current) drug therapy: Secondary | ICD-10-CM | POA: Diagnosis not present

## 2020-06-06 DIAGNOSIS — Z86711 Personal history of pulmonary embolism: Secondary | ICD-10-CM | POA: Diagnosis not present

## 2020-06-06 NOTE — Telephone Encounter (Signed)
Attempted to reach out to Robert [name] to remind him of his appointment today with no success. No option for leaving a VM. I will follow up with patient with a phone call closer to appointment time. Call complete.

## 2020-06-06 NOTE — Telephone Encounter (Signed)
I called Robert [name] this morning to remind him of his 11:00 appointment at the HF clinic. He did not answer and his voicemail is not set up to leave a message. I sent him a text with reminder information in the hope that he sees it early enough to make his appointment.   [name], EMT 06/06/20

## 2020-06-06 NOTE — Telephone Encounter (Signed)
Spoke to Robert [name] who was being seen by Robert [name] in pharmacy clinic today. Robert [name] reports he has been having some pain under his rib cage for several days and went to the ER but left AMA due to wait times. I encouraged him to reach out to his PCP Dr. Annetta Maw for an appointment. He agreed with this plan and said he will call me when he returned home for scheduling a home visit. Call complete.

## 2020-06-06 NOTE — Patient Instructions (Addendum)
It was a pleasure seeing you today!  MEDICATIONS: -No medication changes today -Call if you have questions about your medications.   NEXT APPOINTMENT: Return to clinic in 1 month with Dr. Haroldine Laws.  In general, to take care of your heart failure: -Limit your fluid intake to 2 Liters (half-gallon) per day.   -Limit your salt intake to ideally 2-3 grams (2000-3000 mg) per day. -Weigh yourself daily and record, and bring that "weight diary" to your next appointment.  (Weight gain of 2-3 pounds in 1 day typically means fluid weight.) -The medications for your heart are to help your heart and help you live longer.   -Please contact us before stopping any of your heart medications.  Call the clinic at [phone] with questions or to reschedule future appointments.

## 2020-06-07 ENCOUNTER — Telehealth (HOSPITAL_COMMUNITY): Payer: Self-pay

## 2020-06-07 NOTE — Telephone Encounter (Signed)
Spoke to [place] who agreed to home visit next weds in the afternoon. I will follow up next week to confirm.

## 2020-06-14 ENCOUNTER — Other Ambulatory Visit: Payer: Self-pay | Admitting: Cardiology

## 2020-06-14 ENCOUNTER — Telehealth (HOSPITAL_COMMUNITY): Payer: Self-pay | Admitting: Pharmacy Technician

## 2020-06-14 ENCOUNTER — Other Ambulatory Visit (HOSPITAL_COMMUNITY): Payer: Self-pay | Admitting: *Deleted

## 2020-06-14 MED ORDER — DIGOXIN 125 MCG PO TABS: 0.1250 mg | ORAL_TABLET | Freq: Every day | ORAL | 5 refills | Status: DC

## 2020-06-14 NOTE — Telephone Encounter (Signed)
Received a message from [place], (EMT) that the patient could not afford his [place] co-pay at this time ($140 for 30 days). Per Ander [facility]) note in August, he was provided a Clinical cytogeneticist co-pay card.  The pharmacy does not have the co-pay card on file. Attempted to activate a co-pay card for the patient, the card was activated but the billing information would not load.   Annice Pih of [employer]'s phone number, [phone]). The patient has to call the company, ask them for the billing information and then provide that information to the pharmacy. The company will only provide that information to the patient.  Charlann Boxer, CPhT

## 2020-06-15 ENCOUNTER — Telehealth (HOSPITAL_COMMUNITY): Payer: Self-pay

## 2020-06-15 NOTE — Telephone Encounter (Signed)
Attempted to reach Robert [name] today to set up home visit for tomorrow, unable to leave voicemail. I texted [name] letting him know same and to call me. I will reach out again tomorrow.

## 2020-06-16 ENCOUNTER — Other Ambulatory Visit (HOSPITAL_COMMUNITY): Payer: Self-pay

## 2020-06-16 ENCOUNTER — Other Ambulatory Visit (HOSPITAL_COMMUNITY): Payer: Self-pay | Admitting: *Deleted

## 2020-06-16 MED ORDER — ENTRESTO 97-103 MG PO TABS: 1.0000 | ORAL_TABLET | Freq: Two times a day (BID) | ORAL | 11 refills | Status: DC

## 2020-06-16 NOTE — Progress Notes (Signed)
Paramedicine Encounter    Patient ID: Robert [name], male    DOB: [birth year], 40 y.o.   MRN: [id number]   Patient Care Team: Nche, Charlene Brooke, NP as PCP - General (Internal Medicine) Skeet Latch, MD as PCP - Cardiology (Cardiology)  Patient Active Problem List   Diagnosis Date Noted  . Mood disorder ([place]) 05/16/2020  . Hypotension 03/24/2020  . Polysubstance abuse ([place]) 03/24/2020  . Nausea & vomiting 03/06/2020  . AKI (acute kidney injury) ([place]) 10/30/2019  . Gastritis 10/30/2019  . Elevated INR 10/30/2019  . 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 Acute heart failure ([place]) 10/05/2019  . Pulmonary embolism ([place]) 10/04/2019  . Acute pulmonary embolism ([place]) 10/04/2019  . Chronic systolic CHF (congestive heart failure) ([place])   . Inappropriate sinus tachycardia   . Chest pain 11/03/2013  . Tobacco abuse 11/03/2013    Current Outpatient Medications:  .  carvedilol (COREG) 3.125 MG tablet, Take 1 tablet (3.125 mg total) by mouth 2 (two) times daily., Disp: 60 tablet, Rfl: 11 .  dapagliflozin propanediol (FARXIGA) 10 MG TABS tablet, Take 1 tablet (10 mg total) by mouth daily before breakfast., Disp: 30 tablet, Rfl: 6 .  digoxin (LANOXIN) 0.125 MG tablet, Take 1 tablet (0.125 mg total) by mouth daily., Disp: 30 tablet, Rfl: 5 .  folic acid (FOLVITE) 1 MG tablet, Take 1 tablet (1 mg total) by mouth daily., Disp: 30 tablet, Rfl: 3 .  ivabradine (CORLANOR) 7.5 MG TABS tablet, Take 1 tablet (7.5 mg total) by mouth 2 (two) times daily with a meal., Disp: 60 tablet, Rfl: 3 .  Magnesium Oxide (MAG-200) 200 MG TABS, Take 1 tablet (200 mg total) by mouth 2 (two) times daily. (Patient not taking: No sig reported), Disp: 60 tablet, Rfl: 3 .  ondansetron (ZOFRAN ODT) 4 MG disintegrating tablet, Take 1 tablet (4 mg total) by mouth every 8 (eight) hours as needed for nausea or vomiting., Disp: 20 tablet, Rfl: 0 .  QUEtiapine (SEROQUEL) 100 MG tablet, Take 150  mg by mouth daily. , Disp: , Rfl:  .  rivaroxaban (XARELTO) 20 MG TABS tablet, Take 1 tablet (20 mg total) by mouth daily., Disp: 30 tablet, Rfl: 0 .  sacubitril-valsartan (ENTRESTO) 97-103 MG, Take 1 tablet by mouth 2 (two) times daily., Disp: 60 tablet, Rfl: 11 .  sertraline (ZOLOFT) 25 MG tablet, Take 1 tablet (25 mg total) by mouth daily., Disp: 30 tablet, Rfl: 2 .  spironolactone (ALDACTONE) 25 MG tablet, Take 1 tablet (25 mg total) by mouth daily. HOLD until 03/26/20, Disp: 90 tablet, Rfl: 3 .  thiamine 100 MG tablet, Take 1 tablet (100 mg total) by mouth daily. (Patient not taking: No sig reported), Disp: 30 tablet, Rfl: 0 .  traZODone (DESYREL) 50 MG tablet, Take 1 tablet (50 mg total) by mouth at bedtime., Disp: 60 tablet, Rfl: 0 Allergies  Allergen Reactions  . Bactrim [Sulfamethoxazole-Trimethoprim] Rash     Social History   Socioeconomic History  . Marital status: Single    Spouse name: Not on file  . Number of children: Not on file  . Years of education: Not on file  . Highest education level: Not on file  Occupational History  . Occupation: Ecologist  Tobacco Use  . Smoking status: Current Every Day Smoker    Packs/day: 0.25    Years: 24.00    Pack years: 6.00    Types: Cigarettes  . Smokeless tobacco: Never Used  Vaping Use  . Vaping Use: Former  Substance and Sexual Activity  . Alcohol use: Yes    Alcohol/week: 8.0 standard drinks    Types: 8 Shots of liquor per week    Comment: heavy use since age 80  . Drug use: Yes    Frequency: 1.0 times per week    Types: Marijuana  . Sexual activity: Yes  Other Topics Concern  . Not on file  Social History Narrative  . Not on file   Social Determinants of Health   Financial Resource Strain: High Risk  . Difficulty of Paying Living Expenses: Very hard  Food Insecurity: Food Insecurity Present  . Worried About Charity fundraiser in the Last Year: Sometimes true  . Ran Out of Food in the Last Year:  Sometimes true  Transportation Needs: Not on file  Physical Activity: Not on file  Stress: Not on file  Social Connections: Not on file  Intimate Partner Violence: Not on file    Physical Exam Vitals reviewed.  Constitutional:      Appearance: Normal appearance. He is normal weight.  HENT:     Head: Normocephalic.     Nose: Nose normal.     Mouth/Throat:     Mouth: Mucous membranes are moist.     Pharynx: Oropharynx is clear.  Eyes:     Pupils: Pupils are equal, round, and reactive to light.  Cardiovascular:     Rate and Rhythm: Normal rate and regular rhythm.     Pulses: Normal pulses.     Heart sounds: Normal heart sounds.  Pulmonary:     Effort: Pulmonary effort is normal.     Breath sounds: Normal breath sounds.  Abdominal:     General: Abdomen is flat. There is no distension.     Palpations: Abdomen is soft.     Tenderness: There is no abdominal tenderness. There is no guarding.     Hernia: No hernia is present.     Comments: Right upper quadrant abdominal pain upon inhalation, vomiting, coughing or sneezing.  Musculoskeletal:        General: Normal range of motion.     Cervical back: Normal range of motion.     Right lower leg: No edema.     Left lower leg: No edema.  Skin:    General: Skin is warm and dry.     Capillary Refill: Capillary refill takes less than 2 seconds.  Neurological:     General: No focal deficit present.     Mental Status: He is alert. Mental status is at baseline.  Psychiatric:        Mood and Affect: Mood normal.        Behavior: Behavior normal.    Arrived for home visit for [name] who was alert and oriented reporting he has been sick over the last several weeks with weakness, nausea, vomiting and diarrhea which has included some dark stools and dark emesis. Orlandis also reports right upper quadrant abdominal pain that worsens upon inhalation, coughing, sneezing and vomiting. Takuma says the last time he vomited was yesterday. Nic has  not been taking his medications due to vomiting over the last few weeks. I reviewed medications and confirmed same. I filled pill box accordingly. NO XARELTO, MAGNESIUM, THIAMINE in box due to being out. Andry will pick up and place in box once he obtains the medication. Antonious agreed to visit in one week. Home visit complete.   Marlan HAS LOST 9LBS SINCE LAST  VISIT WITH CLINIC ON 06/06/20.   I was able to set up a virtual visit for Herbie Baltimore for tomorrow at 9:10 with Dr. Ethlyn Gallery from [facility]. GI referral suggested.   Patient aware of plan.  Refills: Xarelto Sertaline  Seroquel  Entresto      Future Appointments  Date Time Provider [place]  07/10/2020 10:00 AM [facility] OP 1 [facility]  07/10/2020 11:00 AM Bensimhon, Shaune Pascal, MD [facility] None  07/17/2020  9:30 AM Nche, Charlene Brooke, NP [facility]     ACTION: Home visit completed Next visit planned for one week

## 2020-06-16 NOTE — Progress Notes (Signed)
Paramedicine Encounter  Follow up to visit for today: vitals entered.   WEIGHT- 190 BP- 122/80 HR- 106 RR-18 O2- 98%  ACTION: Home visit completed Next visit planned for ONE WEEK

## 2020-06-17 ENCOUNTER — Telehealth: Payer: BC Managed Care – PPO | Admitting: Family Medicine

## 2020-06-17 NOTE — Progress Notes (Signed)
Patient scheduled to discuss GI symptoms/referral? Didn't answer number when called for check in. Didn't respond to sent mychart link.

## 2020-06-21 ENCOUNTER — Telehealth (HOSPITAL_COMMUNITY): Payer: Self-pay

## 2020-06-21 NOTE — Telephone Encounter (Signed)
Attempted to call Robert [name] about home visit tomorrow. No answer and VM box is not set up. I texted Robert [name] and will try to call again tomorrow.

## 2020-06-22 ENCOUNTER — Other Ambulatory Visit: Payer: Self-pay

## 2020-06-22 ENCOUNTER — Other Ambulatory Visit (HOSPITAL_COMMUNITY): Payer: Self-pay

## 2020-06-22 NOTE — Progress Notes (Signed)
Paramedicine Encounter    Patient ID: Robert [name], male    DOB: [birth year], 40 y.o.   MRN: [id number]   Patient Care Team: Nche, Robert Brooke, NP as PCP - General (Internal Medicine) Robert Latch, MD as PCP - Cardiology (Cardiology)  Patient Active Problem List   Diagnosis Date Noted  . Mood disorder ([place]) 05/16/2020  . Hypotension 03/24/2020  . Polysubstance abuse ([place]) 03/24/2020  . Nausea & vomiting 03/06/2020  . AKI (acute kidney injury) ([place]) 10/30/2019  . Gastritis 10/30/2019  . Elevated INR 10/30/2019  . 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 Acute heart failure ([place]) 10/05/2019  . Pulmonary embolism ([place]) 10/04/2019  . Acute pulmonary embolism ([place]) 10/04/2019  . Chronic systolic CHF (congestive heart failure) ([place])   . Inappropriate sinus tachycardia   . Chest pain 11/03/2013  . Tobacco abuse 11/03/2013    Current Outpatient Medications:  .  carvedilol (COREG) 3.125 MG tablet, Take 1 tablet (3.125 mg total) by mouth 2 (two) times daily., Disp: 60 tablet, Rfl: 11 .  dapagliflozin propanediol (FARXIGA) 10 MG TABS tablet, Take 1 tablet (10 mg total) by mouth daily before breakfast., Disp: 30 tablet, Rfl: 6 .  digoxin (LANOXIN) 0.125 MG tablet, Take 1 tablet (0.125 mg total) by mouth daily., Disp: 30 tablet, Rfl: 5 .  folic acid (FOLVITE) 1 MG tablet, Take 1 tablet (1 mg total) by mouth daily., Disp: 30 tablet, Rfl: 3 .  ivabradine (CORLANOR) 7.5 MG TABS tablet, Take 1 tablet (7.5 mg total) by mouth 2 (two) times daily with a meal., Disp: 60 tablet, Rfl: 3 .  Magnesium Oxide (MAG-200) 200 MG TABS, Take 1 tablet (200 mg total) by mouth 2 (two) times daily. (Patient not taking: No sig reported), Disp: 60 tablet, Rfl: 3 .  ondansetron (ZOFRAN ODT) 4 MG disintegrating tablet, Take 1 tablet (4 mg total) by mouth every 8 (eight) hours as needed for nausea or vomiting., Disp: 20 tablet, Rfl: 0 .  pantoprazole (PROTONIX) 40 MG tablet, Take 40  mg by mouth daily., Disp: , Rfl:  .  QUEtiapine (SEROQUEL) 100 MG tablet, Take 150 mg by mouth daily. , Disp: , Rfl:  .  rivaroxaban (XARELTO) 20 MG TABS tablet, Take 1 tablet (20 mg total) by mouth daily., Disp: 30 tablet, Rfl: 0 .  sacubitril-valsartan (ENTRESTO) 97-103 MG, Take 1 tablet by mouth 2 (two) times daily., Disp: 60 tablet, Rfl: 11 .  sertraline (ZOLOFT) 25 MG tablet, Take 1 tablet (25 mg total) by mouth daily., Disp: 30 tablet, Rfl: 2 .  spironolactone (ALDACTONE) 25 MG tablet, Take 1 tablet (25 mg total) by mouth daily. HOLD until 03/26/20, Disp: 90 tablet, Rfl: 3 .  thiamine 100 MG tablet, Take 1 tablet (100 mg total) by mouth daily. (Patient not taking: No sig reported), Disp: 30 tablet, Rfl: 0 .  traZODone (DESYREL) 50 MG tablet, Take 1 tablet (50 mg total) by mouth at bedtime., Disp: 60 tablet, Rfl: 0 Allergies  Allergen Reactions  . Bactrim [Sulfamethoxazole-Trimethoprim] Rash     Social History   Socioeconomic History  . Marital status: Single    Spouse name: Not on file  . Number of children: Not on file  . Years of education: Not on file  . Highest education level: Not on file  Occupational History  . Occupation: Ecologist  Tobacco Use  . Smoking status: Current Every Day Smoker    Packs/day: 0.25    Years: 24.00  Pack years: 6.00    Types: Cigarettes  . Smokeless tobacco: Never Used  Vaping Use  . Vaping Use: Former  Substance and Sexual Activity  . Alcohol use: Yes    Alcohol/[name]: 8.0 standard drinks    Types: 8 Shots of liquor per [name]    Comment: heavy use since age 89  . Drug use: Yes    Frequency: 1.0 times per [name]    Types: Marijuana  . Sexual activity: Yes  Other Topics Concern  . Not on file  Social History Narrative  . Not on file   Social Determinants of Health   Financial Resource Strain: High Risk  . Difficulty of Paying Living Expenses: Very hard  Food Insecurity: Food Insecurity Present  . Worried About Paediatric nurse in the Last Year: Sometimes true  . Ran Out of Food in the Last Year: Sometimes true  Transportation Needs: Not on file  Physical Activity: Not on file  Stress: Not on file  Social Connections: Not on file  Intimate Partner Violence: Not on file    Physical Exam Vitals reviewed.  Constitutional:      Appearance: He is normal weight.  HENT:     Head: Normocephalic.     Nose: Nose normal.     Mouth/Throat:     Mouth: Mucous membranes are moist.     Pharynx: Oropharynx is clear.  Eyes:     Pupils: Pupils are equal, round, and reactive to light.  Cardiovascular:     Rate and Rhythm: Normal rate and regular rhythm.     Pulses: Normal pulses.     Heart sounds: Normal heart sounds.  Pulmonary:     Effort: Pulmonary effort is normal.     Breath sounds: Normal breath sounds.  Abdominal:     General: Abdomen is flat. There is no distension.     Palpations: Abdomen is soft.     Tenderness: There is no abdominal tenderness. There is no guarding.     Comments: Nausea, Vomiting   Musculoskeletal:        General: Normal range of motion.     Cervical back: Normal range of motion.     Right lower leg: No edema.     Left lower leg: No edema.  Skin:    Capillary Refill: Capillary refill takes less than 2 seconds.     Coloration: Skin is pale.  Neurological:     General: No focal deficit present.     Mental Status: He is alert. Mental status is at baseline.  Psychiatric:        Mood and Affect: Mood normal.     Arrived for home visit for Robert [name] who was alert and oriented reporting he was still sick. Robert [name] reports he is still having nausea, vomiting and general fatigue. During assessment, Robert [name] had a full episode of vomiting. Skin was pale and diaphoretic following episode. Robert [name], he reports this is due to the vomiting. Robert [name] has RX for Zofran and Sucralfate in which he hasn't taken in over a [name].  Robert  reviewed medications and confirmed [name]. 2 pill boxes filled accordingly. Some pills missing due to patient not able to pick up medicines at pharmacy. Robert called [facility] they reported they needed new insurance card and they could fill remainder of medicines needed. Robert [name] and his mother reports they will take care of this today. Robert left written instructions that him and  and his mother agreed they understood of where to place missing meds.  CARVEDILOL Pill box #2- SAT NOON & EVE  SEROQUEL  Pill box #2 SAT BEDTIME   XARELTO ALL DAYS NOON & EVE IN BOTH BOXES   ENTRESTO  Pillbox #1  SUN EVE, MON NOON & EVE, THURS NOON, SAT NOON & EVE   SERTALINE  Pillbox #2 WEDS THRU SAT NOON  Refills NEEDED NEXT [name]: Digoxin  Folic Acid  Farxiga    READY AT PHARMACY: CARVEDILOL ENTRESTO XARELTO SERTALINE   Robert [name] was able to get appointment tomorrow with PCP at 10. Patient and patient's mother agreed to take him. Robert plan to see Robert [name] in two weeks. Patient understands if he needs assistance with pill box or home visit to call me.   Home visit complete.   Future Appointments  Date Time Provider [place]  07/10/2020 10:00 AM [facility] ECHO OP 1 [facility]  07/10/2020 11:00 AM Bensimhon, Shaune Pascal, MD [facility] None  07/17/2020  9:30 AM Nche, Robert Brooke, NP [facility]     ACTION: Home visit completed Next visit planned for two weeks

## 2020-06-23 ENCOUNTER — Ambulatory Visit: Payer: BC Managed Care – PPO | Admitting: Nurse Practitioner

## 2020-06-23 ENCOUNTER — Encounter: Payer: Self-pay | Admitting: Nurse Practitioner

## 2020-06-23 VITALS — BP 100/70 | HR 88 | Temp 98.5°F | Ht 70.0 in | Wt 197.0 lb

## 2020-06-23 DIAGNOSIS — R739 Hyperglycemia, unspecified: Secondary | ICD-10-CM

## 2020-06-23 DIAGNOSIS — K2950 Unspecified chronic gastritis without bleeding: Secondary | ICD-10-CM | POA: Diagnosis not present

## 2020-06-23 DIAGNOSIS — F39 Unspecified mood [affective] disorder: Secondary | ICD-10-CM

## 2020-06-23 DIAGNOSIS — R112 Nausea with vomiting, unspecified: Secondary | ICD-10-CM | POA: Diagnosis not present

## 2020-06-23 LAB — HEMOGLOBIN A1C: Hgb A1c MFr Bld: 5.6 % (ref 4.6–6.5)

## 2020-06-23 LAB — BASIC METABOLIC PANEL
BUN: 17 mg/dL (ref 6–23)
CO2: 27 mEq/L (ref 19–32)
Calcium: 9.8 mg/dL (ref 8.4–10.5)
Chloride: 98 mEq/L (ref 96–112)
Creatinine, Ser: 1.23 mg/dL (ref 0.40–1.50)
GFR: 74.34 mL/min (ref >60.00)
Glucose, Bld: 113 mg/dL — ABNORMAL HIGH (ref 70–99)
Potassium: 4 mEq/L (ref 3.5–5.1)
Sodium: 138 mEq/L (ref 135–145)

## 2020-06-23 LAB — HEPATIC FUNCTION PANEL
ALT: 46 U/L (ref 0–53)
AST: 38 U/L — ABNORMAL HIGH (ref 0–37)
Albumin: 4.6 g/dL (ref 3.5–5.2)
Alkaline Phosphatase: 60 U/L (ref 39–117)
Bilirubin, Direct: 0.1 mg/dL (ref 0.0–0.3)
Total Bilirubin: 0.3 mg/dL (ref 0.2–1.2)
Total Protein: 7.9 g/dL (ref 6.0–8.3)

## 2020-06-23 LAB — CBC WITH DIFFERENTIAL/PLATELET
Basophils Absolute: 0.1 10*3/uL (ref 0.0–0.1)
Basophils Relative: 1.5 % (ref 0.0–3.0)
Eosinophils Absolute: 0.2 10*3/uL (ref 0.0–0.7)
Eosinophils Relative: 3.4 % (ref 0.0–5.0)
HCT: 36.7 % — ABNORMAL LOW (ref 39.0–52.0)
Hemoglobin: 12.3 g/dL — ABNORMAL LOW (ref 13.0–17.0)
Lymphocytes Relative: 30.7 % (ref 12.0–46.0)
Lymphs Abs: 1.6 10*3/uL (ref 0.7–4.0)
MCHC: 33.5 g/dL (ref 30.0–36.0)
MCV: 88.7 fl (ref 78.0–100.0)
Monocytes Absolute: 0.4 10*3/uL (ref 0.1–1.0)
Monocytes Relative: 8.4 % (ref 3.0–12.0)
Neutro Abs: 2.8 10*3/uL (ref 1.4–7.7)
Neutrophils Relative %: 56 % (ref 43.0–77.0)
Platelets: 238 10*3/uL (ref 150.0–400.0)
RBC: 4.14 Mil/uL — ABNORMAL LOW (ref 4.22–5.81)
RDW: 16.3 % — ABNORMAL HIGH (ref 11.5–15.5)
WBC: 5.1 10*3/uL (ref 4.0–10.5)

## 2020-06-23 MED ORDER — QUETIAPINE FUMARATE 100 MG PO TABS
150.0000 mg | ORAL_TABLET | Freq: Every day | ORAL | 3 refills | Status: DC
Start: 2020-06-23 — End: 2020-10-13

## 2020-06-23 MED ORDER — PANTOPRAZOLE SODIUM 40 MG PO TBEC: 40.0000 mg | DELAYED_RELEASE_TABLET | Freq: Every day | ORAL | 1 refills | Status: DC

## 2020-06-23 MED ORDER — SUCRALFATE 1 G PO TABS: 1.0000 g | ORAL_TABLET | Freq: Three times a day (TID) | ORAL | 0 refills | Status: DC

## 2020-06-23 MED ORDER — SERTRALINE HCL 25 MG PO TABS: 25.0000 mg | ORAL_TABLET | Freq: Every day | ORAL | 3 refills | Status: DC

## 2020-06-23 NOTE — Progress Notes (Signed)
Subjective:  Patient ID: Robert [name], male    DOB: 01-18-1981  Age: 40 y.o. MRN: [id number]  CC: Acute Visit (Pt c/o acid reflux that caused some vomiting earlier during the week, along with chest heaviness. )  GI Problem The primary symptoms include abdominal pain, nausea, vomiting, hematemesis and rash. Primary symptoms do not include fever, weight loss, fatigue, diarrhea, melena, jaundice, hematochezia, dysuria, myalgias or arthralgias. The illness began 6 to 7 days ago. The onset was gradual. The problem has not changed since onset. The abdominal pain began more than 2 days ago. The abdominal pain has been unchanged since its onset. The abdominal pain is located in the RUQ. The abdominal pain does not radiate. The abdominal pain is relieved by nothing.  The vomiting began more than 2 days ago. Vomiting occurs 2 to 5 times per day. The emesis contains stomach contents.  The rash began more than 1 week ago. The rash appears on the face. The rash is not associated with blisters, itching or weeping.  The illness is also significant for anorexia. The illness does not include chills, dysphagia, odynophagia, bloating, constipation, tenesmus, back pain or itching. Significant associated medical issues include GERD and alcohol abuse.  only takes pantoprazole 40mg  daily. Report last ETOH consumption was 3days ago.  Wt Readings from Last 3 Encounters:  06/23/20 197 lb (89.4 kg)  06/22/20 194 lb (88 kg)  06/16/20 190 lb (86.2 kg)   BP Readings from Last 3 Encounters:  06/23/20 100/70  06/22/20 128/68  06/16/20 122/80   Reviewed past Medical, Social and Family history today.  Outpatient Medications Prior to Visit  Medication Sig Dispense Refill  . carvedilol (COREG) 3.125 MG tablet Take 1 tablet (3.125 mg total) by mouth 2 (two) times daily. 60 tablet 11  . dapagliflozin propanediol (FARXIGA) 10 MG TABS tablet Take 1 tablet (10 mg total) by mouth daily before breakfast. 30 tablet 6  .  digoxin (LANOXIN) 0.125 MG tablet Take 1 tablet (0.125 mg total) by mouth daily. 30 tablet 5  . folic acid (FOLVITE) 1 MG tablet Take 1 tablet (1 mg total) by mouth daily. 30 tablet 3  . ivabradine (CORLANOR) 7.5 MG TABS tablet Take 1 tablet (7.5 mg total) by mouth 2 (two) times daily with a meal. 60 tablet 3  . Magnesium Oxide (MAG-200) 200 MG TABS Take 1 tablet (200 mg total) by mouth 2 (two) times daily. 60 tablet 3  . ondansetron (ZOFRAN ODT) 4 MG disintegrating tablet Take 1 tablet (4 mg total) by mouth every 8 (eight) hours as needed for nausea or vomiting. 20 tablet 0  . rivaroxaban (XARELTO) 20 MG TABS tablet Take 1 tablet (20 mg total) by mouth daily. 30 tablet 0  . sacubitril-valsartan (ENTRESTO) 97-103 MG Take 1 tablet by mouth 2 (two) times daily. 60 tablet 11  . spironolactone (ALDACTONE) 25 MG tablet Take 1 tablet (25 mg total) by mouth daily. HOLD until 03/26/20 90 tablet 3  . thiamine 100 MG tablet Take 1 tablet (100 mg total) by mouth daily. 30 tablet 0  . pantoprazole (PROTONIX) 40 MG tablet Take 40 mg by mouth daily.    . QUEtiapine (SEROQUEL) 100 MG tablet Take 150 mg by mouth daily.     . sertraline (ZOLOFT) 25 MG tablet Take 1 tablet (25 mg total) by mouth daily. 30 tablet 2  . traZODone (DESYREL) 50 MG tablet Take 1 tablet (50 mg total) by mouth at bedtime. 60 tablet 0   No  facility-administered medications prior to visit.    ROS See HPI  Objective:  BP 100/70 (BP Location: Left Arm, Patient Position: Sitting, Cuff Size: Large)   Pulse 88   Temp 98.5 F (36.9 C) (Temporal)   Ht 5\' 10"  (1.778 m)   Wt 197 lb (89.4 kg)   SpO2 98%   BMI 28.27 kg/m   Physical Exam Constitutional:      General: He is not in acute distress. Cardiovascular:     Pulses: Normal pulses.  Pulmonary:     Effort: Pulmonary effort is normal.  Abdominal:     General: Bowel sounds are normal. There is no distension.     Palpations: Abdomen is soft. There is no mass.     Tenderness:  There is no abdominal tenderness. There is no right CVA tenderness, left CVA tenderness or guarding.     Hernia: No hernia is present.  Neurological:     Mental Status: He is alert and oriented to person, place, and time.  Psychiatric:        Mood and Affect: Mood normal.        Behavior: Behavior normal.    Assessment & Plan:  This visit occurred during the SARS-CoV-2 public health emergency.  Safety protocols were in place, including screening questions prior to the visit, additional usage of staff PPE, and extensive cleaning of exam room while observing appropriate contact time as indicated for disinfecting solutions.   Robert [name] was seen today for acute visit.  Diagnoses and all orders for this visit:  Nausea and vomiting, intractability of vomiting not specified, unspecified vomiting type -     Ambulatory referral to Gastroenterology -     Basic metabolic panel -     Hepatic function panel -     US Abdomen Limited RUQ (LIVER/GB); Future -     CBC w/Diff -     sucralfate (CARAFATE) 1 g tablet; Take 1 tablet (1 g total) by mouth 4 (four) times daily -  with meals and at bedtime.  Chronic gastritis without bleeding, unspecified gastritis type -     Ambulatory referral to Gastroenterology -     sucralfate (CARAFATE) 1 g tablet; Take 1 tablet (1 g total) by mouth 4 (four) times daily -  with meals and at bedtime. -     pantoprazole (PROTONIX) 40 MG tablet; Take 1 tablet (40 mg total) by mouth daily.  Hyperglycemia -     Hemoglobin A1c  Mood disorder (HCC) -     QUEtiapine (SEROQUEL) 100 MG tablet; Take 1.5 tablets (150 mg total) by mouth daily. -     sertraline (ZOLOFT) 25 MG tablet; Take 1 tablet (25 mg total) by mouth daily.  Stable lab results. Proceed with ABD [place], medications sent, and GI referral. Call office if no improvement by Monday. Go to ED if symptoms worsen  Problem List Items Addressed This Visit      Digestive   Gastritis   Relevant Medications   sucralfate  (CARAFATE) 1 g tablet   pantoprazole (PROTONIX) 40 MG tablet   Other Relevant Orders   Ambulatory referral to Gastroenterology   Nausea & vomiting - Primary   Relevant Medications   sucralfate (CARAFATE) 1 g tablet   Other Relevant Orders   Ambulatory referral to Gastroenterology   Basic metabolic panel (Completed)   Hepatic function panel (Completed)   US Abdomen Limited RUQ (LIVER/GB)   CBC w/Diff (Completed)     Other   Mood disorder (HCC)  Relevant Medications   QUEtiapine (SEROQUEL) 100 MG tablet   sertraline (ZOLOFT) 25 MG tablet    Other Visit Diagnoses    Hyperglycemia       Relevant Orders   Hemoglobin A1c (Completed)      Follow-up: Return if symptoms worsen or fail to improve.  [name], NP

## 2020-06-23 NOTE — Patient Instructions (Addendum)
You will be contacted to schedule appt for ABD [place] and appt with GI.  Stable lab results. Proceed with ABD [place], medications sent, and GI referral. Call office if no improvement by Monday. Go to ED if symptoms worsen  Resume zofran and carafate.  Gastritis, Adult Gastritis is inflammation of the stomach. There are two kinds of gastritis:  Acute gastritis. This kind develops suddenly.  Chronic gastritis. This kind is much more common and lasts for a long time. Gastritis happens when the lining of the stomach becomes weak or gets damaged. Without treatment, gastritis can lead to stomach bleeding and ulcers. What are the causes? This condition may be caused by:  An infection.  Drinking too much alcohol.  Certain medicines. These include steroids, antibiotics, and some over-the-counter medicines, such as aspirin or ibuprofen.  Having too much acid in the stomach.  A disease of the intestines or stomach.  Stress.  An allergic reaction.  Crohn's disease.  Some cancer treatments (radiation). Sometimes the cause of this condition is not known. What are the signs or symptoms? Symptoms of this condition include:  Pain or a burning sensation in the upper abdomen.  Nausea.  Vomiting.  An uncomfortable feeling of fullness after eating.  Weight loss.  Bad breath.  Blood in your vomit or stools. In some cases, there are no symptoms. How is this diagnosed? This condition may be diagnosed with:  Your medical history and a description of your symptoms.  A physical exam.  Tests. These can include: ? Blood tests. ? Stool tests. ? A test in which a thin, flexible instrument with a light and a camera is passed down the esophagus and into the stomach (upper endoscopy). ? A test in which a sample of tissue is taken for testing (biopsy). How is this treated? This condition may be treated with medicines. The medicines that are used vary depending on the cause of the  gastritis:  If the condition is caused by a bacterial infection, you may be given antibiotic medicines.  If the condition is caused by too much acid in the stomach, you may be given medicines called H2 blockers, proton pump inhibitors, or antacids. Treatment may also involve stopping the use of certain medicines, such as aspirin, ibuprofen, or other NSAIDs. Follow these instructions at home: Medicines  Take over-the-counter and prescription medicines only as told by your health care provider.  If you were prescribed an antibiotic medicine, take it as told by your health care provider. Do not stop taking the antibiotic even if you start to feel better. Eating and drinking  Eat small, frequent meals instead of large meals.  Avoid foods and drinks that make your symptoms worse.  Drink enough fluid to keep your urine pale yellow.   Alcohol use  Do not drink alcohol if: ? Your health care provider tells you not to drink. ? You are pregnant, may be pregnant, or are planning to become pregnant.  If you drink alcohol: ? Limit your use to:  0-1 drink a day for women.  0-2 drinks a day for men. ? Be aware of how much alcohol is in your drink. In the [place] one drink equals one 12 oz bottle of beer (355 mL), one 5 oz glass of wine (148 mL), or one 1 oz glass of hard liquor (44 mL). General instructions  Talk with your health care provider about ways to manage stress, such as getting regular exercise or practicing deep breathing, meditation, or yoga.  Do  not use any products that contain nicotine or tobacco, such as cigarettes and e-cigarettes. If you need help quitting, ask your health care provider.  Keep all follow-up visits as told by your health care provider. This is important. Contact a health care provider if:  Your symptoms get worse.  Your symptoms return after treatment. Get help right away if:  You vomit blood or material that looks like coffee grounds.  You have black  or dark red stools.  You are unable to keep fluids down.  Your abdominal pain gets worse.  You have a fever.  You do not feel better after one week. Summary  Gastritis is inflammation of the lining of the stomach that can occur suddenly (acute) or develop slowly over time (chronic).  This condition is diagnosed with a medical history, a physical exam, or tests.  This condition may be treated with medicines to treat infection or medicines to reduce the amount of acid in your stomach.  Follow your health care provider's instructions about taking medicines, making changes to your diet, and knowing when to call for help. This information is not intended to replace advice given to you by your health care provider. Make sure you discuss any questions you have with your health care provider. Document Revised: 09/16/2017 Document Reviewed: 09/16/2017 Elsevier Patient Education  [employer]

## 2020-06-26 ENCOUNTER — Telehealth: Payer: Self-pay | Admitting: Nurse Practitioner

## 2020-06-26 NOTE — Telephone Encounter (Signed)
Pt called back, He said he is feeling a little bit better since appt but that he is still coughing up blood and still chest discomfort and no appetite.

## 2020-06-26 NOTE — Telephone Encounter (Signed)
Please advise to go to ED for additional eval due to chest pain and blood in sputum. Thank you

## 2020-06-26 NOTE — Telephone Encounter (Signed)
Pt advised to go to ED

## 2020-06-27 ENCOUNTER — Telehealth: Payer: Self-pay | Admitting: Nurse Practitioner

## 2020-06-27 NOTE — Telephone Encounter (Signed)
I will not be able to provide a note. Test should be schedule after work hours

## 2020-06-27 NOTE — Telephone Encounter (Signed)
I let pt know and he requested a call back from nurse

## 2020-06-27 NOTE — Telephone Encounter (Signed)
Pt called and said he needs a note for work, pt was around someone Sunday who tested positive for covid. Pt needs to go get tested for covid but cant get off from his work without a doctors note to do so

## 2020-06-28 NOTE — Telephone Encounter (Signed)
Returned patient call but unable to reach him via phone and voicemail not set up.

## 2020-06-29 NOTE — Telephone Encounter (Signed)
Pt returning call.Please call pt back .

## 2020-06-30 ENCOUNTER — Ambulatory Visit
Admission: RE | Admit: 2020-06-30 | Discharge: 2020-06-30 | Disposition: A | Discharge status: Home / Self Care | Payer: BC Managed Care – PPO | Source: Ambulatory Visit | Attending: Nurse Practitioner | Admitting: Nurse Practitioner

## 2020-06-30 DIAGNOSIS — R112 Nausea with vomiting, unspecified: Secondary | ICD-10-CM

## 2020-07-09 NOTE — Progress Notes (Signed)
ADVANCED HF CLINIC NOTE  PCP: Primary Cardiologist: Dr Haroldine Laws    Reason for F/u: F/ui for Chronic Systolic Heart Failure   HPI: Robert [name] is a 40 y/o male with h/o heavy ETOH and cocaine use, PE,  biventricular systolic heart failure.  Admitted with acute onset biventricular HF in 5/21. EF 15%. RV down. He was drinking at least 2 beers every night and about 12-24 over the weekend. Also drinking 1/5 of liqour every other day. Cath showed normal coronaries. Elevated filling pressures with normal output. Placed on milrinone and diuresed with IV lasix. As he improved milrinone was weaned off . Transitioned to oral lasix. 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 CT showed small LLL PE. Placed on xarelto. Started on HF meds.  Discharge weight 178 pounds.   Readmitted 6/21 for mild acute blood loss related anemia. EGD showed no active bleeding but evidence of some gastritis.  He was switched to oral PPI twice daily, H&H remained stable and he did not require any transfusions.  He was instructed to continue Xarelto.   Has been followed by Paramedicine and HF PharmD with med titration.   Continues to have very frequent ED visits. Admitted 10/25-10/28/21 for ETOH withdrawal symptoms and AKI. Admitted 03/23/2020 with AMS in the setting ETOH abuse. Hypotensive, AKI, and with lactic acidosis. Entresto, spiro, and lasix. All restarted prior to discharge. Returned to the ED on 03/26/20 with nausea/vomiting.   Echo 10/21 EF 30-35% mild RV HK.   Has been following closely with HF NP/PA and Paramedicine.   He has been feeling sick lately, stomach pain, nausea, vomiting once every 5 days usually at the end of the week, coincides with when he stops drinking, insomnia, chest pain.  Has not been accumulating extra fluid, breathing has been doing well, walking around as much as he wants, does feel fatigued at times.  He is trying to stop alcohol use.  He has not drank in several days and is having withdrawal  symptoms today, vomited last night.  Has some leftover librium at home but has not been taking this.  Says he's taking all meds except xarelto he thinks, did not bring his meds today.  Does admit to missing evening doses of CHF meds.    At the end of the visit his mother also made [place] aware that the patient had a syncopal episode one week ago.  She reports waking him for dinner and when he got up and went to walk to the dinner table he fell in mid stride, no convulsions but it seemed like he just went back to sleep suddenly.  She was not able to wake him up for several minutes.    Cardiac studies. Echo (03/10/20) EF 30-35% RV normal. No effusion.  ECHO 10/12/2019 Pericardial effusion remains moderate in size. No tamponade physiology. ECHO 10/05/2019 EF <20%  Prominent trabeculation. Grade III DD  [facility] 10/05/2019 showed normal coronaries, elevated filling pressures with normal output.  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  SH:  Social History   Socioeconomic History  . Marital status: Single    Spouse name: Not on file  . Number of children: Not on file  . Years of education: Not on file  . Highest education level: Not on file  Occupational History  . Occupation: Ecologist  Tobacco Use  . Smoking status: Current Every Day  Smoker    Packs/day: 0.25    Years: 24.00    Pack years: 6.00    Types: Cigarettes  . Smokeless tobacco: Never Used  Vaping Use  . Vaping Use: Former  Substance and Sexual Activity  . Alcohol use: Yes    Alcohol/week: 8.0 standard drinks    Types: 8 Shots of liquor per week    Comment: heavy use since age 46  . Drug use: Yes    Frequency: 1.0 times per week    Types: Marijuana  . Sexual activity: Yes  Other Topics Concern  . Not on file  Social History Narrative  . Not on file   Social Determinants of Health   Financial  Resource Strain: High Risk  . Difficulty of Paying Living Expenses: Very hard  Food Insecurity: Food Insecurity Present  . Worried About Charity fundraiser in the Last Year: Sometimes true  . Ran Out of Food in the Last Year: Sometimes true  Transportation Needs: Not on file  Physical Activity: Not on file  Stress: Not on file  Social Connections: Not on file  Intimate Partner Violence: Not on file    FH:  Family History  Problem Relation Age of Onset  . Heart failure Father   . Diabetes Father   . Kidney disease Father   . Heart disease Father 39  . Heart disease Maternal Grandfather 64  . Heart disease Paternal Grandfather 30    Past Medical History:  Diagnosis Date  . Alcohol use   . Alcohol withdrawal ([place]) 03/06/2020  . Asthma   . CHF (congestive heart failure) (HCC)     Current Outpatient Medications  Medication Sig Dispense Refill  . carvedilol (COREG) 3.125 MG tablet Take 1 tablet (3.125 mg total) by mouth 2 (two) times daily. 60 tablet 11  . dapagliflozin propanediol (FARXIGA) 10 MG TABS tablet Take 1 tablet (10 mg total) by mouth daily before breakfast. 30 tablet 6  . digoxin (LANOXIN) 0.125 MG tablet Take 1 tablet (0.125 mg total) by mouth daily. 30 tablet 5  . folic acid (FOLVITE) 1 MG tablet Take 1 tablet (1 mg total) by mouth daily. 30 tablet 3  . ivabradine (CORLANOR) 7.5 MG TABS tablet Take 1 tablet (7.5 mg total) by mouth 2 (two) times daily with a meal. 60 tablet 3  . ondansetron (ZOFRAN ODT) 4 MG disintegrating tablet Take 1 tablet (4 mg total) by mouth every 8 (eight) hours as needed for nausea or vomiting. 20 tablet 0  . pantoprazole (PROTONIX) 40 MG tablet Take 1 tablet (40 mg total) by mouth daily. 30 tablet 1  . QUEtiapine (SEROQUEL) 100 MG tablet Take 1.5 tablets (150 mg total) by mouth daily. 180 tablet 3  . sacubitril-valsartan (ENTRESTO) 97-103 MG Take 1 tablet by mouth 2 (two) times daily. 60 tablet 11  . sertraline (ZOLOFT) 25 MG tablet Take 1  tablet (25 mg total) by mouth daily. 90 tablet 3  . spironolactone (ALDACTONE) 25 MG tablet Take 1 tablet (25 mg total) by mouth daily. HOLD until 03/26/20 90 tablet 3  . sucralfate (CARAFATE) 1 g tablet Take 1 tablet (1 g total) by mouth 4 (four) times daily -  with meals and at bedtime. 120 tablet 0  . thiamine 100 MG tablet Take 1 tablet (100 mg total) by mouth daily. 30 tablet 0   No current facility-administered medications for this encounter.   Wt Readings from Last 3 Encounters:  07/10/20 87.9 kg (193 lb 12.8  oz)  06/23/20 89.4 kg (197 lb)  06/22/20 88 kg (194 lb)    Vitals:   07/10/20 1058  BP: (!) 150/90  Pulse: (!) 52  SpO2: 100%  Weight: 87.9 kg (193 lb 12.8 oz)     PHYSICAL EXAM: General:  Tremulous. Weak appearing No resp difficulty HEENT: normal Neck: supple. no JVD. Carotids 2+ bilat; no bruits. No lymphadenopathy or thryomegaly appreciated. Cor: PMI nondisplaced. Regular rate & rhythm. No rubs, gallops or murmurs. Lungs: clear Abdomen: soft, nontender, nondistended. No hepatosplenomegaly. No bruits or masses. Good bowel sounds. Extremities: no cyanosis, clubbing, rash, edema Neuro: alert & orientedx3, cranial nerves grossly intact. moves all 4 extremities w/o difficulty. Affect anxious. Tremulous  ASSESSMENT & PLAN: 1.Chronic Systolic Heart Failure, NICM  - ECHO 5/21 w/ severely reduced EF 15% RV moderately reduced. Suspect ETOH and cocaine playing a role.  - LHC 5/21 normal cors. 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  - Echo 03/10/20 ---> EF 30-35% RV normal. No effusion.  - Stable NYHA II-early III NYHA II-III. - Continue Entresto  97-103 mg bid  - Continue Coreg 3.125 mg bid  - Continue spiro 25 mg daily.  - Continue digoxin 0.125 mg daily, Check Dig level today  - Continue Corlanor 7.5 mg bid.  - Continue Farxiga 10 mg daily  - Check labs - Refer for genetic testing for LVNC   - Not a candidate for ICD or advanced therapies w/ polysubstance abuse  - Repeat echo at next visit  2. Syncope - Denies related to ETOH intoxication - Place zio x 14 days - no driving x 6 months  3.Pulmonary Emboli - CTA w/ small LLL PE 5/21 - LE Venous dopplers negative for DVT  - Given small PE. Risk of ongoing AC likely outweighs benefit  4. ETOH Abuse - Heavy drinker for many years since the age of 89. Recently had detox at [facility] but started drinking again. He reports today that he has not had a drink in 5 days.  - SW following. He was given a list to set up counseling.   5. H/O Cocaine Abuse - most recent UDS 10/31/19 negative  - denies recent cocaine use.   6. Pericardial Effusion - moderate circumferential effusion on cMRI. - resolved on echo 02/2020   7. Tobacco Abuse - still smoking 5 cigs a day.  - Discussed need for complete cessation  8. H/o GIB/ Gastritis w/ ABLA - h/o heavy ETOH abuse + chronic anticoagulation therapy w/ Xarelto - denies melena/ hematochezia  - continue PPI for GI protection  - Has been off xarelto for at least a week due to being out of it. Would stop    Addendum: After I left the exam room, patient developed acute seizure in clinic (likely ETOH withdrawal) and fell off exam table and hit his head and developed incontinence. Came to quickly but was post-ictal. IV placed and given IVF. Stabilized and transported to the ER personally. Case signed out to Dr. Alvino Chapel in ER.   Robert Bickers, MD  2:27 PM

## 2020-07-10 ENCOUNTER — Ambulatory Visit (HOSPITAL_BASED_OUTPATIENT_CLINIC_OR_DEPARTMENT_OTHER)
Admission: RE | Admit: 2020-07-10 | Discharge: 2020-07-10 | Disposition: A | Discharge status: Home / Self Care | Payer: BC Managed Care – PPO | Source: Ambulatory Visit | Attending: Internal Medicine | Admitting: Internal Medicine

## 2020-07-10 ENCOUNTER — Emergency Department (HOSPITAL_COMMUNITY): Payer: BC Managed Care – PPO

## 2020-07-10 ENCOUNTER — Encounter (HOSPITAL_COMMUNITY): Payer: Self-pay | Admitting: Internal Medicine

## 2020-07-10 ENCOUNTER — Other Ambulatory Visit: Payer: Self-pay

## 2020-07-10 ENCOUNTER — Telehealth (HOSPITAL_COMMUNITY): Payer: Self-pay

## 2020-07-10 ENCOUNTER — Inpatient Hospital Stay (HOSPITAL_COMMUNITY)
Admission: EM | Admit: 2020-07-10 | Discharge: 2020-07-12 | DRG: 897 | Disposition: A | Discharge status: Home / Self Care | Payer: BC Managed Care – PPO | Attending: Internal Medicine | Admitting: Internal Medicine

## 2020-07-10 VITALS — BP 150/90 | HR 52 | Wt 193.8 lb

## 2020-07-10 DIAGNOSIS — Z7901 Long term (current) use of anticoagulants: Secondary | ICD-10-CM

## 2020-07-10 DIAGNOSIS — I2699 Other pulmonary embolism without acute cor pulmonale: Secondary | ICD-10-CM

## 2020-07-10 DIAGNOSIS — I5022 Chronic systolic (congestive) heart failure: Secondary | ICD-10-CM | POA: Insufficient documentation

## 2020-07-10 DIAGNOSIS — F1020 Alcohol dependence, uncomplicated: Secondary | ICD-10-CM | POA: Diagnosis present

## 2020-07-10 DIAGNOSIS — E1122 Type 2 diabetes mellitus with diabetic chronic kidney disease: Secondary | ICD-10-CM | POA: Diagnosis present

## 2020-07-10 DIAGNOSIS — R55 Syncope and collapse: Secondary | ICD-10-CM | POA: Diagnosis present

## 2020-07-10 DIAGNOSIS — G4089 Other seizures: Secondary | ICD-10-CM | POA: Diagnosis present

## 2020-07-10 DIAGNOSIS — F1290 Cannabis use, unspecified, uncomplicated: Secondary | ICD-10-CM | POA: Diagnosis present

## 2020-07-10 DIAGNOSIS — F17210 Nicotine dependence, cigarettes, uncomplicated: Secondary | ICD-10-CM | POA: Diagnosis present

## 2020-07-10 DIAGNOSIS — I426 Alcoholic cardiomyopathy: Secondary | ICD-10-CM | POA: Diagnosis present

## 2020-07-10 DIAGNOSIS — W1830XA Fall on same level, unspecified, initial encounter: Secondary | ICD-10-CM | POA: Diagnosis present

## 2020-07-10 DIAGNOSIS — F1410 Cocaine abuse, uncomplicated: Secondary | ICD-10-CM | POA: Diagnosis present

## 2020-07-10 DIAGNOSIS — Z794 Long term (current) use of insulin: Secondary | ICD-10-CM

## 2020-07-10 DIAGNOSIS — F1010 Alcohol abuse, uncomplicated: Secondary | ICD-10-CM

## 2020-07-10 DIAGNOSIS — Z20822 Contact with and (suspected) exposure to covid-19: Secondary | ICD-10-CM | POA: Diagnosis present

## 2020-07-10 DIAGNOSIS — I5082 Biventricular heart failure: Secondary | ICD-10-CM | POA: Diagnosis present

## 2020-07-10 DIAGNOSIS — S40212A Abrasion of left shoulder, initial encounter: Secondary | ICD-10-CM | POA: Diagnosis present

## 2020-07-10 DIAGNOSIS — Z8249 Family history of ischemic heart disease and other diseases of the circulatory system: Secondary | ICD-10-CM | POA: Diagnosis not present

## 2020-07-10 DIAGNOSIS — Z882 Allergy status to sulfonamides status: Secondary | ICD-10-CM

## 2020-07-10 DIAGNOSIS — Z841 Family history of disorders of kidney and ureter: Secondary | ICD-10-CM

## 2020-07-10 DIAGNOSIS — R32 Unspecified urinary incontinence: Secondary | ICD-10-CM | POA: Diagnosis present

## 2020-07-10 DIAGNOSIS — F10239 Alcohol dependence with withdrawal, unspecified: Secondary | ICD-10-CM | POA: Diagnosis present

## 2020-07-10 DIAGNOSIS — F419 Anxiety disorder, unspecified: Secondary | ICD-10-CM | POA: Diagnosis present

## 2020-07-10 DIAGNOSIS — R569 Unspecified convulsions: Secondary | ICD-10-CM

## 2020-07-10 DIAGNOSIS — F10930 Alcohol use, unspecified with withdrawal, uncomplicated: Secondary | ICD-10-CM

## 2020-07-10 DIAGNOSIS — N181 Chronic kidney disease, stage 1: Secondary | ICD-10-CM | POA: Diagnosis present

## 2020-07-10 DIAGNOSIS — I313 Pericardial effusion (noninflammatory): Secondary | ICD-10-CM | POA: Diagnosis present

## 2020-07-10 DIAGNOSIS — F10939 Alcohol use, unspecified with withdrawal, unspecified: Secondary | ICD-10-CM

## 2020-07-10 DIAGNOSIS — Z86711 Personal history of pulmonary embolism: Secondary | ICD-10-CM | POA: Diagnosis not present

## 2020-07-10 DIAGNOSIS — Z720 Tobacco use: Secondary | ICD-10-CM

## 2020-07-10 DIAGNOSIS — F32A Depression, unspecified: Secondary | ICD-10-CM | POA: Diagnosis present

## 2020-07-10 DIAGNOSIS — W228XXA Striking against or struck by other objects, initial encounter: Secondary | ICD-10-CM | POA: Diagnosis present

## 2020-07-10 DIAGNOSIS — Z833 Family history of diabetes mellitus: Secondary | ICD-10-CM | POA: Diagnosis not present

## 2020-07-10 DIAGNOSIS — I428 Other cardiomyopathies: Secondary | ICD-10-CM | POA: Diagnosis present

## 2020-07-10 DIAGNOSIS — I130 Hypertensive heart and chronic kidney disease with heart failure and stage 1 through stage 4 chronic kidney disease, or unspecified chronic kidney disease: Secondary | ICD-10-CM | POA: Diagnosis present

## 2020-07-10 DIAGNOSIS — F10230 Alcohol dependence with withdrawal, uncomplicated: Secondary | ICD-10-CM

## 2020-07-10 DIAGNOSIS — Z79899 Other long term (current) drug therapy: Secondary | ICD-10-CM

## 2020-07-10 DIAGNOSIS — K2970 Gastritis, unspecified, without bleeding: Secondary | ICD-10-CM | POA: Diagnosis present

## 2020-07-10 LAB — COMPREHENSIVE METABOLIC PANEL
ALT: 72 U/L — ABNORMAL HIGH (ref 0–44)
AST: 58 U/L — ABNORMAL HIGH (ref 15–41)
Albumin: 4.1 g/dL (ref 3.5–5.0)
Alkaline Phosphatase: 46 U/L (ref 38–126)
Anion gap: 23 — ABNORMAL HIGH (ref 5–15)
BUN: 16 mg/dL (ref 6–20)
CO2: 16 mmol/L — ABNORMAL LOW (ref 22–32)
Calcium: 9.2 mg/dL (ref 8.9–10.3)
Chloride: 97 mmol/L — ABNORMAL LOW (ref 98–111)
Creatinine, Ser: 1.46 mg/dL — ABNORMAL HIGH (ref 0.61–1.24)
GFR, Estimated: >60 mL/min (ref >60)
Glucose, Bld: 170 mg/dL — ABNORMAL HIGH (ref 70–99)
Potassium: 4.2 mmol/L (ref 3.5–5.1)
Sodium: 136 mmol/L (ref 135–145)
Total Bilirubin: 1.3 mg/dL — ABNORMAL HIGH (ref 0.3–1.2)
Total Protein: 7.3 g/dL (ref 6.5–8.1)

## 2020-07-10 LAB — CBC WITH DIFFERENTIAL/PLATELET
Abs Immature Granulocytes: 0.06 10*3/uL (ref 0.00–0.07)
Basophils Absolute: 0 10*3/uL (ref 0.0–0.1)
Basophils Relative: 1 %
Eosinophils Absolute: 0.1 10*3/uL (ref 0.0–0.5)
Eosinophils Relative: 1 %
HCT: 35.3 % — ABNORMAL LOW (ref 39.0–52.0)
Hemoglobin: 11.4 g/dL — ABNORMAL LOW (ref 13.0–17.0)
Immature Granulocytes: 1 %
Lymphocytes Relative: 11 %
Lymphs Abs: 0.7 10*3/uL (ref 0.7–4.0)
MCH: 29.9 pg (ref 26.0–34.0)
MCHC: 32.3 g/dL (ref 30.0–36.0)
MCV: 92.7 fL (ref 80.0–100.0)
Monocytes Absolute: 0.5 10*3/uL (ref 0.1–1.0)
Monocytes Relative: 8 %
Neutro Abs: 4.7 10*3/uL (ref 1.7–7.7)
Neutrophils Relative %: 78 %
Platelets: 145 10*3/uL — ABNORMAL LOW (ref 150–400)
RBC: 3.81 MIL/uL — ABNORMAL LOW (ref 4.22–5.81)
RDW: 14.5 % (ref 11.5–15.5)
WBC: 5.9 10*3/uL (ref 4.0–10.5)
nRBC: 0 % (ref 0.0–0.2)

## 2020-07-10 LAB — TROPONIN I (HIGH SENSITIVITY)
Troponin I (High Sensitivity): 9 ng/L (ref <18)
Troponin I (High Sensitivity): 7 ng/L (ref <18)

## 2020-07-10 LAB — ETHANOL: Alcohol, Ethyl (B): <10 mg/dL (ref <10)

## 2020-07-10 LAB — DIGOXIN LEVEL: Digoxin Level: 0.4 ng/mL — ABNORMAL LOW (ref 0.8–2.0)

## 2020-07-10 LAB — ECHOCARDIOGRAM COMPLETE
Area-P 1/2: 2.07 cm2
Calc EF: 38.8 %
S' Lateral: 4.3 cm
Single Plane A2C EF: 39.7 %
Single Plane A4C EF: 38.6 %

## 2020-07-10 LAB — PROTIME-INR
INR: 1.1 (ref 0.8–1.2)
Prothrombin Time: 13.3 seconds (ref 11.4–15.2)

## 2020-07-10 MED ORDER — CARVEDILOL 3.125 MG PO TABS
3.1250 mg | ORAL_TABLET | Freq: Two times a day (BID) | ORAL | Status: DC
  Administered 2020-07-10 – 2020-07-12 (×4): 3.125 mg via ORAL
  Filled 2020-07-10 (×5): qty 1

## 2020-07-10 MED ORDER — ENOXAPARIN SODIUM 40 MG/0.4ML ~~LOC~~ SOLN: 40.0000 mg | SUBCUTANEOUS | Status: DC

## 2020-07-10 MED ORDER — FLUTICASONE PROPIONATE 50 MCG/ACT NA SUSP
2.0000 | Freq: Every day | NASAL | Status: DC
  Administered 2020-07-11 – 2020-07-12 (×2): 2 via NASAL
  Filled 2020-07-10: qty 16

## 2020-07-10 MED ORDER — PANTOPRAZOLE SODIUM 40 MG PO TBEC
40.0000 mg | DELAYED_RELEASE_TABLET | Freq: Every day | ORAL | Status: DC
  Administered 2020-07-11 – 2020-07-12 (×2): 40 mg via ORAL
  Filled 2020-07-10 (×2): qty 1

## 2020-07-10 MED ORDER — FOLIC ACID 1 MG PO TABS
1.0000 mg | ORAL_TABLET | Freq: Every day | ORAL | Status: DC
  Administered 2020-07-10 – 2020-07-12 (×3): 1 mg via ORAL
  Filled 2020-07-10 (×3): qty 1

## 2020-07-10 MED ORDER — ACETAMINOPHEN 650 MG RE SUPP: 650.0000 mg | Freq: Four times a day (QID) | RECTAL | Status: DC | PRN

## 2020-07-10 MED ORDER — SERTRALINE HCL 50 MG PO TABS
25.0000 mg | ORAL_TABLET | Freq: Every day | ORAL | Status: DC
  Administered 2020-07-10 – 2020-07-12 (×3): 25 mg via ORAL
  Filled 2020-07-10 (×4): qty 1

## 2020-07-10 MED ORDER — SODIUM CHLORIDE 0.9 % IV SOLN
75.0000 mL/h | INTRAVENOUS | Status: DC
  Administered 2020-07-10 – 2020-07-11 (×3): 75 mL/h via INTRAVENOUS

## 2020-07-10 MED ORDER — LORAZEPAM 1 MG PO TABS: 1.0000 mg | ORAL_TABLET | ORAL | Status: DC | PRN

## 2020-07-10 MED ORDER — LORAZEPAM 1 MG PO TABS: 0.0000 mg | ORAL_TABLET | Freq: Three times a day (TID) | ORAL | Status: DC

## 2020-07-10 MED ORDER — DAPAGLIFLOZIN PROPANEDIOL 10 MG PO TABS
10.0000 mg | ORAL_TABLET | Freq: Every day | ORAL | Status: DC
  Filled 2020-07-10 (×3): qty 1

## 2020-07-10 MED ORDER — SUCRALFATE 1 G PO TABS
1.0000 g | ORAL_TABLET | Freq: Three times a day (TID) | ORAL | Status: DC
  Administered 2020-07-10 – 2020-07-12 (×8): 1 g via ORAL
  Filled 2020-07-10 (×8): qty 1

## 2020-07-10 MED ORDER — RIVAROXABAN 20 MG PO TABS
20.0000 mg | ORAL_TABLET | Freq: Every day | ORAL | Status: DC
  Administered 2020-07-10 – 2020-07-11 (×2): 20 mg via ORAL
  Filled 2020-07-10 (×2): qty 1

## 2020-07-10 MED ORDER — LORAZEPAM 1 MG PO TABS
0.0000 mg | ORAL_TABLET | ORAL | Status: DC
  Administered 2020-07-10 – 2020-07-11 (×2): 1 mg via ORAL
  Filled 2020-07-10 (×2): qty 1

## 2020-07-10 MED ORDER — ACETAMINOPHEN 325 MG PO TABS
650.0000 mg | ORAL_TABLET | Freq: Four times a day (QID) | ORAL | Status: DC | PRN
  Administered 2020-07-10 – 2020-07-11 (×4): 650 mg via ORAL
  Filled 2020-07-10 (×4): qty 2

## 2020-07-10 MED ORDER — DIGOXIN 125 MCG PO TABS
0.1250 mg | ORAL_TABLET | Freq: Every day | ORAL | Status: DC
  Administered 2020-07-11 – 2020-07-12 (×2): 0.125 mg via ORAL
  Filled 2020-07-10 (×2): qty 1

## 2020-07-10 MED ORDER — ONDANSETRON 4 MG PO TBDP: 4.0000 mg | ORAL_TABLET | Freq: Three times a day (TID) | ORAL | Status: DC | PRN

## 2020-07-10 MED ORDER — QUETIAPINE FUMARATE 50 MG PO TABS
150.0000 mg | ORAL_TABLET | Freq: Every day | ORAL | Status: DC
  Filled 2020-07-10: qty 1

## 2020-07-10 MED ORDER — IVABRADINE HCL 7.5 MG PO TABS
7.5000 mg | ORAL_TABLET | Freq: Two times a day (BID) | ORAL | Status: DC
  Administered 2020-07-11 – 2020-07-12 (×3): 7.5 mg via ORAL
  Filled 2020-07-10 (×5): qty 1

## 2020-07-10 MED ORDER — MIDAZOLAM HCL 2 MG/2ML IJ SOLN
2.0000 mg | Freq: Once | INTRAMUSCULAR | Status: DC
  Filled 2020-07-10: qty 2

## 2020-07-10 MED ORDER — LORAZEPAM 2 MG/ML IJ SOLN
2.0000 mg | Freq: Once | INTRAMUSCULAR | Status: DC
  Filled 2020-07-10: qty 1

## 2020-07-10 MED ORDER — LORAZEPAM 2 MG/ML IJ SOLN
1.0000 mg | INTRAMUSCULAR | Status: DC | PRN
  Administered 2020-07-10: 1 mg via INTRAVENOUS
  Filled 2020-07-10: qty 1

## 2020-07-10 MED ORDER — SACUBITRIL-VALSARTAN 97-103 MG PO TABS
1.0000 | ORAL_TABLET | Freq: Two times a day (BID) | ORAL | Status: DC
  Administered 2020-07-10 – 2020-07-12 (×4): 1 via ORAL
  Filled 2020-07-10 (×4): qty 1

## 2020-07-10 MED ORDER — THIAMINE HCL 100 MG PO TABS
100.0000 mg | ORAL_TABLET | Freq: Every day | ORAL | Status: DC
Start: 2020-07-10 — End: 2020-07-12
  Administered 2020-07-10 – 2020-07-12 (×3): 100 mg via ORAL
  Filled 2020-07-10 (×3): qty 1

## 2020-07-10 MED ORDER — ADULT MULTIVITAMIN W/MINERALS CH
1.0000 | ORAL_TABLET | Freq: Every day | ORAL | Status: DC
  Administered 2020-07-10 – 2020-07-12 (×3): 1 via ORAL
  Filled 2020-07-10 (×3): qty 1

## 2020-07-10 MED ORDER — QUETIAPINE FUMARATE 50 MG PO TABS
150.0000 mg | ORAL_TABLET | Freq: Every day | ORAL | Status: DC
  Filled 2020-07-10 (×3): qty 1

## 2020-07-10 MED ORDER — SPIRONOLACTONE 25 MG PO TABS
25.0000 mg | ORAL_TABLET | Freq: Every day | ORAL | Status: DC
  Administered 2020-07-11 – 2020-07-12 (×2): 25 mg via ORAL
  Filled 2020-07-10 (×2): qty 1

## 2020-07-10 NOTE — Telephone Encounter (Signed)
I called Mr Robert [name] to remind him of his appointment today at 11:00. He did not answer and his voicemail was not set up so I was unable to leave a message.   [name], EMT 07/10/20

## 2020-07-10 NOTE — ED Notes (Signed)
Patient transported to CT 

## 2020-07-10 NOTE — ED Notes (Signed)
Resent CBC to lab

## 2020-07-10 NOTE — Progress Notes (Signed)
Pt has arrived on the unit. All equipment is present and all IV are intact. Pt has telephone and call light within reach

## 2020-07-10 NOTE — H&P (Addendum)
History and Physical    Robert [name] YPP:[id number] DOB: 07-10-1980 DOA: 07/10/2020  PCP: Robert Buffy, NP (Confirm with patient/family/NH records and if not entered, this has to be entered at [facility] point of entry) Patient coming from: Home  I have personally briefly reviewed patient's old medical records in [place]  Chief Complaint: Seizure  HPI: Robert [name] is a 40 y.o. male with medical history significant of nonischemic cardiomyopathy, chronic biventricular heart failure, alcohol abuse, HTN, PE on Xarelto, CKD stage I, IIDM scented with seizure.  Last drink 3 days ago, 5 glasses of vodka.  Started to feel shaky and nausea yesterday vomited 2 times with normal content no bile no blood.  Denies any chest pain shortness of breath.  Went to see cardiology this morning for routine follow-up, had 1 witnessed tonic-clonic seizure while inside cardiologist office, patient fell backward and hit his head then started to have whole body shaking, elbow rolling to the back and was found to have Urinary incontinency.  Seizure stopped spontaneously patient became postictal with confusion.  Patient denied any headache, no chest pain, no shortness of breath denies any weakness numbness of the limbs. ED Course: Patient was found to be in alcohol withdrawal.  CT head negative for acute intracranial findings.  Alcohol level undetectable.  UDS pending.  Review of Systems: As per HPI otherwise 14 point review of systems negative.    Past Medical History:  Diagnosis Date  . Alcohol use   . Alcohol withdrawal ([place]) 03/06/2020  . Asthma   . CHF (congestive heart failure) ([place])     Past Surgical History:  Procedure Laterality Date  . ESOPHAGOGASTRODUODENOSCOPY (EGD) WITH PROPOFOL N/A 11/01/2019   Procedure: ESOPHAGOGASTRODUODENOSCOPY (EGD) WITH PROPOFOL;  Surgeon: [name], MD;  Location: [facility] ENDOSCOPY;  Service: Endoscopy;  Laterality: N/A;  . RIGHT/LEFT HEART CATH AND  CORONARY ANGIOGRAPHY N/A 10/06/2019   Procedure: RIGHT/LEFT HEART CATH AND CORONARY ANGIOGRAPHY;  Surgeon: [name], MD;  Location: [place] CV LAB;  Service: Cardiovascular;  Laterality: N/A;  . WRIST SURGERY       reports that he has been smoking cigarettes. He has a 6.00 pack-year smoking history. He has never used smokeless tobacco. He reports current alcohol use of about 8.0 standard drinks of alcohol per week. He reports current drug use. Frequency: 1.00 time per week. Drug: Marijuana.  Allergies  Allergen Reactions  . Bactrim [Sulfamethoxazole-Trimethoprim] Rash    Family History  Problem Relation Age of Onset  . Heart failure Father   . Diabetes Father   . Kidney disease Father   . Heart disease Father 34  . Heart disease Maternal Grandfather 32  . Heart disease Paternal Grandfather 31     Prior to Admission medications   Medication Sig Start Date End Date Taking? Authorizing Provider  carvedilol (COREG) 3.125 MG tablet Take 1 tablet (3.125 mg total) by mouth 2 (two) times daily. 04/11/20 04/11/21  Darrick Grinder D, NP  dapagliflozin propanediol (FARXIGA) 10 MG TABS tablet Take 1 tablet (10 mg total) by mouth daily before breakfast. 12/09/19   Bensimhon, Shaune Pascal, MD  digoxin (LANOXIN) 0.125 MG tablet Take 1 tablet (0.125 mg total) by mouth daily. 06/14/20   Lyda Jester M, PA-C  folic acid (FOLVITE) 1 MG tablet Take 1 tablet (1 mg total) by mouth daily. 03/16/20   Bensimhon, Shaune Pascal, MD  ivabradine (CORLANOR) 7.5 MG TABS tablet Take 1 tablet (7.5 mg total) by mouth 2 (two) times  daily with a meal. 03/27/20   Bensimhon, Shaune Pascal, MD  ondansetron (ZOFRAN ODT) 4 MG disintegrating tablet Take 1 tablet (4 mg total) by mouth every 8 (eight) hours as needed for nausea or vomiting. 03/26/20   Maudie Flakes, MD  pantoprazole (PROTONIX) 40 MG tablet Take 1 tablet (40 mg total) by mouth daily. 06/23/20   Nche, Charlene Brooke, NP  QUEtiapine (SEROQUEL) 100 MG tablet Take 1.5  tablets (150 mg total) by mouth daily. 06/23/20   Nche, Charlene Brooke, NP  sacubitril-valsartan (ENTRESTO) 97-103 MG Take 1 tablet by mouth 2 (two) times daily. 06/16/20   Lyda Jester M, PA-C  sertraline (ZOLOFT) 25 MG tablet Take 1 tablet (25 mg total) by mouth daily. 06/23/20 06/23/21  Nche, Charlene Brooke, NP  spironolactone (ALDACTONE) 25 MG tablet Take 1 tablet (25 mg total) by mouth daily. HOLD until 03/26/20 03/24/20   Desiree Hane, MD  sucralfate (CARAFATE) 1 g tablet Take 1 tablet (1 g total) by mouth 4 (four) times daily -  with meals and at bedtime. 06/23/20   Nche, Charlene Brooke, NP  thiamine 100 MG tablet Take 1 tablet (100 mg total) by mouth daily. 11/03/19   Thurnell Lose, MD    Physical Exam: Vitals:   07/10/20 1345 07/10/20 1400 07/10/20 1415 07/10/20 1430  BP: 133/77 121/74 (!) 131/95 (!) 149/112  Pulse: 65 66 (!) 57 70  Resp: 16 12 15 20   Temp:      TempSrc:      SpO2: 98% 97% 100% 98%  Weight:      Height:        Constitutional: NAD, calm, comfortable Vitals:   07/10/20 1345 07/10/20 1400 07/10/20 1415 07/10/20 1430  BP: 133/77 121/74 (!) 131/95 (!) 149/112  Pulse: 65 66 (!) 57 70  Resp: 16 12 15 20   Temp:      TempSrc:      SpO2: 98% 97% 100% 98%  Weight:      Height:       Eyes: PERRL, lids and conjunctivae normal ENMT: Mucous membranes are moist. Posterior pharynx clear of any exudate or lesions.Normal dentition.  Neck: normal, supple, no masses, no thyromegaly Respiratory: clear to auscultation bilaterally, no wheezing, no crackles. Normal respiratory effort. No accessory muscle use.  Cardiovascular: Regular rate and rhythm, no murmurs / rubs / gallops. No extremity edema. 2+ pedal pulses. No carotid bruits.  Abdomen: no tenderness, no masses palpated. No hepatosplenomegaly. Bowel sounds positive.  Musculoskeletal: no clubbing / cyanosis. No joint deformity upper and lower extremities. Good ROM, no contractures. Normal muscle tone.  Skin: no  rashes, lesions, ulcers. No induration, posterior hematoma, laceration wound on the back Neurologic: CN 2-12 grossly intact. Sensation intact, DTR normal. Strength 5/5 in all 4.  Coarse tremors noticed on fingertips and tongue Psychiatric: Normal judgment and insight. Alert and oriented x 3. Normal mood.     Labs on Admission: I have personally reviewed following labs and imaging studies  CBC: No results for input(s): WBC, NEUTROABS, HGB, HCT, MCV, PLT in the last 168 hours. Basic Metabolic Panel: Recent Labs  Lab 07/10/20 1216  NA 136  K 4.2  CL 97*  CO2 16*  GLUCOSE 170*  BUN 16  CREATININE 1.46*  CALCIUM 9.2   GFR: Estimated Creatinine Clearance: 76.7 mL/min (A) (by C-G formula based on SCr of 1.46 mg/dL (H)). Liver Function Tests: Recent Labs  Lab 07/10/20 1216  AST 58*  ALT 72*  ALKPHOS 46  BILITOT  1.3*  PROT 7.3  ALBUMIN 4.1   No results for input(s): LIPASE, AMYLASE in the last 168 hours. No results for input(s): AMMONIA in the last 168 hours. Coagulation Profile: Recent Labs  Lab 07/10/20 1216  INR 1.1   Cardiac Enzymes: No results for input(s): CKTOTAL, CKMB, CKMBINDEX, TROPONINI in the last 168 hours. BNP (last 3 results) No results for input(s): PROBNP in the last 8760 hours. HbA1C: No results for input(s): HGBA1C in the last 72 hours. CBG: No results for input(s): GLUCAP in the last 168 hours. Lipid Profile: No results for input(s): CHOL, HDL, LDLCALC, TRIG, CHOLHDL, LDLDIRECT in the last 72 hours. Thyroid Function Tests: No results for input(s): TSH, T4TOTAL, FREET4, T3FREE, THYROIDAB in the last 72 hours. Anemia Panel: No results for input(s): VITAMINB12, FOLATE, FERRITIN, TIBC, IRON, RETICCTPCT in the last 72 hours. Urine analysis: No results found for: COLORURINE, APPEARANCEUR, [place], [place], GLUCOSEU, HGBUR, BILIRUBINUR, KETONESUR, PROTEINUR, UROBILINOGEN, NITRITE, LEUKOCYTESUR  Radiological Exams on Admission: CT Head Wo  Contrast  Result Date: 07/10/2020 CLINICAL DATA:  Head trauma, abnormal mental status. Witness seizure. EXAM: CT HEAD WITHOUT CONTRAST TECHNIQUE: Contiguous axial images were obtained from the base of the skull through the vertex without intravenous contrast. COMPARISON:  Head CT 03/26/2020. FINDINGS: Brain: Cerebral volume is normal. There is no acute intracranial hemorrhage. No demarcated cortical infarct. No extra-axial fluid collection. No evidence of intracranial mass. No midline shift. Vascular: No hyperdense vessel. Skull: Normal. Negative for fracture or focal lesion. Sinuses/Orbits: Visualized orbits show no acute finding. 2.7 cm left maxillary sinus mucous retention cyst. Background trace mucosal thickening within the bilateral maxillary sinuses. Mild partial opacification of anterior right ethmoid air cells. IMPRESSION: No evidence of acute intracranial abnormality. Paranasal sinus disease as described, most notably a 2.7 cm mucous retention cyst within the left maxillary sinus. Electronically Signed   By: Kellie Simmering DO   On: 07/10/2020 14:26   DG Chest Portable 1 View  Result Date: 07/10/2020 CLINICAL DATA:  Altered mental status, seizure today EXAM: PORTABLE CHEST 1 VIEW COMPARISON:  Portable exam 1221 hours compared to 06/04/2020 FINDINGS: Minimal enlargement of cardiac silhouette. Mediastinal contours and pulmonary vascularity normal. Lungs clear. No infiltrate, pleural effusion, or pneumothorax. Osseous structures unremarkable. IMPRESSION: No acute abnormalities. Electronically Signed   By: Lavonia Dana M.D.   On: 07/10/2020 12:31    EKG: Independently reviewed.  ST-T changes on chest leads  Assessment/Plan Active Problems:   * No active hospital problems. *  (please populate well all problems here in Problem List. (For example, if patient is on BP meds at home and you resume or decide to hold them, it is a problem that needs to be her. Same for CAD, COPD, HLD and so on)  Alcohol  withdrawal seizure and acute alcohol withdrawal -Given Hx that his last drink was 3 days ago and started to have withdrawal symptoms at home since yesterday, today's episode most likely alcohol withdrawal seizure. -Start CIWA protocol/detox, tapering benzos -Consult case management, patient interested in outpatient counseling. -Discussed with patient mother at bedside, expect 1 to 2 days hospital stay for detox. -UDS pending.  History of nonischemic/alcoholic cardiomyopathy with chronic systolic CHF -Euvolemic, continue home meds.  History of PE -Restart Xarelto tomorrow.  Anxiety depression -Continue SSRI and Seroquel  Back laceration -Wound care  IIDM -Continue home meds.  CKD stage I -Euvolumic.  DVT prophylaxis: Xarelto Code Status: Full Code Family Communication: Mother at bedside Disposition Plan: Expect 1 to 2 days hospital stay  to taper benzo to treat alcohol withdrawal Consults called: None Admission status: PCU   Lequita Halt MD [facility] Pager [phone]  07/10/2020, 3:02 PM

## 2020-07-10 NOTE — ED Notes (Signed)
Help get patient undressed into a gown on the monitor patient is resting with call bell in reach  

## 2020-07-10 NOTE — Progress Notes (Signed)
CSW met with pt during clinic appt to check in.  Pt reports feeling really badly recently- has been nauseated and hands were shaking.  Does admit to not taking medications as prescribed due to feeling poorly or sleeping and forgetting to take.  Has not worked since December first due to [place] exposure then due to feeling poorly- brought short term disability paperwork with him today which CSW had him complete his portions then put in disability folder for review and completion.  CSW then spoke with pt about current substance abuse.  Pt admits to continued drinking but states he is barely drinking at this time.  Usually only 1-2 air plane bottles at a time when he does drink and only doing this a few times a week.  Pt does admit to having periods of nausea and shakiness following when he drinks despite reportedly low number of drinks.   Pt would like to stop drinking so he can feel better and understands that drinking is contributing to how he feels and is harmful to his heart.  Pt not agreeable to inpatient rehab at this time- did not like the group setting of inpatient.  CSW discussed other options and pt is agreeable to going to [facility]- would receive one on one counseling for substance abuse and depression.  Pt is agreeable- CSW assisted in calling- had to leave message for intake specialist who will follow up with pt and schedule appt.  Will continue to follow and assist as needed  Jorge Ny, [facility] Desk#: [phone] Cell#: [phone]

## 2020-07-10 NOTE — ED Triage Notes (Signed)
Pt arrives from the heart clinic where pt had a witnessed seizure. Pt arrives post ictal on 2L [place]. Pt also hit his head on the floor, pt on xarelto.

## 2020-07-10 NOTE — Consult Note (Signed)
[facility] Nurse Consult Note: Reason for Consult: Back wound.  Linear abrasion on left scapula sustained during fall following seizure on concrete. Wound type:trauma Pressure Injury POA: N/A Measurement:14cm x 0.2cm x 0.1cm Wound POI:PPGF, moist Drainage (amount, consistency, odor) scant serous Periwound:ecchymotic Dressing procedure/placement/frequency: I will provide Nursing with guidance for a topical antimicrobial nonadherent dressing (xeroform) topped with a silicone foam dressing to make dressing removal and reapplication more comfortable.this can be performed once daily for 7-10 days.    [place] nursing team will not follow, but will remain available to this patient, the nursing and medical teams.  Please re-consult if needed. Thanks, Maudie Flakes, MSN, RN, [place], Arther Abbott  Pager# [phone]

## 2020-07-10 NOTE — Progress Notes (Signed)
Echocardiogram 2D Echocardiogram has been performed.  [place] Deputy Ronnita Paz 07/10/2020, 10:46 AM

## 2020-07-10 NOTE — ED Notes (Signed)
Admitting md to the bedside

## 2020-07-10 NOTE — ED Provider Notes (Signed)
[facility] EMERGENCY DEPARTMENT Provider Note   CSN: [id number] Arrival date & time: 07/10/20  1212     History Chief Complaint  Patient presents with  . Seizures    Robert [name] is a 40 y.o. male.  HPI Level 5 caveat due to altered mental status.  Reportedly was at the heart failure clinic.  Has history of alcohol use and has been drinking less.  Reportedly tremulous at a clinic and then had a generalized tonic-clonic seizure with some urinary incontinence.  It was witnessed by staff.  Confused after.  No nausea vomiting diarrhea.  Patient really cannot provide much history.  Does have known congestive heart failure with a poor EF.  Reportedly has had some syncopal episodes but per Dr. Haroldine Laws this was clearly a seizure.  Reportedly is on Xarelto but per patient and staff at the clinic has not been taking for a week or 2.    Past Medical History:  Diagnosis Date  . Alcohol use   . Alcohol withdrawal ([place]) 03/06/2020  . Asthma   . CHF (congestive heart failure) [facility])     Patient Active Problem List   Diagnosis Date Noted  . Mood disorder ([place]) 05/16/2020  . Hypotension 03/24/2020  . Polysubstance abuse ([place]) 03/24/2020  . Nausea & vomiting 03/06/2020  . AKI (acute kidney injury) ([place]) 10/30/2019  . Gastritis 10/30/2019  . Elevated INR 10/30/2019  . 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[place])   . Acute heart failure ([place]) 10/05/2019  . Pulmonary embolism ([place]) 10/04/2019  . Acute pulmonary embolism ([place]) 10/04/2019  . Chronic systolic CHF (congestive heart failure) ([place])   . Inappropriate sinus tachycardia   . Chest pain 11/03/2013  . Tobacco abuse 11/03/2013    Past Surgical History:  Procedure Laterality Date  . ESOPHAGOGASTRODUODENOSCOPY (EGD) WITH PROPOFOL N/A 11/01/2019   Procedure: ESOPHAGOGASTRODUODENOSCOPY (EGD) WITH PROPOFOL;  Surgeon: [name], MD;  Location: [facility] ENDOSCOPY;  Service: Endoscopy;  Laterality:  N/A;  . RIGHT/LEFT HEART CATH AND CORONARY ANGIOGRAPHY N/A 10/06/2019   Procedure: RIGHT/LEFT HEART CATH AND CORONARY ANGIOGRAPHY;  Surgeon: [name], MD;  Location: [place] CV LAB;  Service: Cardiovascular;  Laterality: N/A;  . WRIST SURGERY         Family History  Problem Relation Age of Onset  . Heart failure Father   . Diabetes Father   . Kidney disease Father   . Heart disease Father 39  . Heart disease Maternal Grandfather 60  . Heart disease Paternal Grandfather 60    Social History   Tobacco Use  . Smoking status: Current Every Day Smoker    Packs/day: 0.25    Years: 24.00    Pack years: 6.00    Types: Cigarettes  . Smokeless tobacco: Never Used  Vaping Use  . Vaping Use: Former  Substance Use Topics  . Alcohol use: Yes    Alcohol/week: 8.0 standard drinks    Types: 8 Shots of liquor per week    Comment: heavy use since age 67  . Drug use: Yes    Frequency: 1.0 times per week    Types: Marijuana    Home Medications Prior to Admission medications   Medication Sig Start Date End Date Taking? Authorizing Provider  carvedilol (COREG) 3.125 MG tablet Take 1 tablet (3.125 mg total) by mouth 2 (two) times daily. 04/11/20 04/11/21  Clegg, Amy D, NP  dapagliflozin propanediol (FARXIGA) 10 MG TABS tablet Take 1 tablet (10 mg  total) by mouth daily before breakfast. 12/09/19   Bensimhon, Shaune Pascal, MD  digoxin (LANOXIN) 0.125 MG tablet Take 1 tablet (0.125 mg total) by mouth daily. 06/14/20   Lyda Jester M, PA-C  folic acid (FOLVITE) 1 MG tablet Take 1 tablet (1 mg total) by mouth daily. 03/16/20   Bensimhon, Shaune Pascal, MD  ivabradine (CORLANOR) 7.5 MG TABS tablet Take 1 tablet (7.5 mg total) by mouth 2 (two) times daily with a meal. 03/27/20   Bensimhon, Shaune Pascal, MD  ondansetron (ZOFRAN ODT) 4 MG disintegrating tablet Take 1 tablet (4 mg total) by mouth every 8 (eight) hours as needed for nausea or vomiting. 03/26/20   Maudie Flakes, MD  pantoprazole  (PROTONIX) 40 MG tablet Take 1 tablet (40 mg total) by mouth daily. 06/23/20   Nche, Charlene Brooke, NP  QUEtiapine (SEROQUEL) 100 MG tablet Take 1.5 tablets (150 mg total) by mouth daily. 06/23/20   Nche, Charlene Brooke, NP  sacubitril-valsartan (ENTRESTO) 97-103 MG Take 1 tablet by mouth 2 (two) times daily. 06/16/20   Lyda Jester M, PA-C  sertraline (ZOLOFT) 25 MG tablet Take 1 tablet (25 mg total) by mouth daily. 06/23/20 06/23/21  Nche, Charlene Brooke, NP  spironolactone (ALDACTONE) 25 MG tablet Take 1 tablet (25 mg total) by mouth daily. HOLD until 03/26/20 03/24/20   Desiree Hane, MD  sucralfate (CARAFATE) 1 g tablet Take 1 tablet (1 g total) by mouth 4 (four) times daily -  with meals and at bedtime. 06/23/20   Nche, Charlene Brooke, NP  thiamine 100 MG tablet Take 1 tablet (100 mg total) by mouth daily. 11/03/19   Thurnell Lose, MD    Allergies    Bactrim [sulfamethoxazole-trimethoprim]  Review of Systems   Review of Systems  Unable to perform ROS: Mental status change    Physical Exam Updated Vital Signs BP (!) 149/112   Pulse 70   Temp 97.7 F (36.5 C) (Temporal)   Resp 20   Ht 5\' 10"  (1.778 m)   Wt 87.9 kg   SpO2 98%   BMI 27.81 kg/m   Physical Exam Vitals and nursing note reviewed.  HENT:     Head: Atraumatic.     Comments: Rash along mid face. Eyes:     Extraocular Movements: Extraocular movements intact.     Pupils: Pupils are equal, round, and reactive to light.  Cardiovascular:     Rate and Rhythm: Regular rhythm.  Pulmonary:     Breath sounds: No wheezing or rhonchi.  Abdominal:     Tenderness: There is no abdominal tenderness.  Musculoskeletal:        General: No tenderness.     Cervical back: Neck supple.  Skin:    General: Skin is warm.     Capillary Refill: Capillary refill takes less than 2 seconds.  Neurological:     Mental Status: He is alert.     Comments: Awake and some confusion.  Mental status improving.  Moving all extremities.  Does  appear to have a little tremor     ED Results / Procedures / Treatments   Labs (all labs ordered are listed, but only abnormal results are displayed) Labs Reviewed  COMPREHENSIVE METABOLIC PANEL - Abnormal; Notable for the following components:      Result Value   Chloride 97 (*)    CO2 16 (*)    Glucose, Bld 170 (*)    Creatinine, Ser 1.46 (*)    AST 58 (*)  ALT 72 (*)    Total Bilirubin 1.3 (*)    Anion gap 23 (*)    All other components within normal limits  DIGOXIN LEVEL - Abnormal; Notable for the following components:   Digoxin Level 0.4 (*)    All other components within normal limits  PROTIME-INR  ETHANOL  CBC WITH DIFFERENTIAL/PLATELET  RAPID URINE DRUG SCREEN, HOSP PERFORMED  URINALYSIS, ROUTINE W REFLEX MICROSCOPIC  CBC WITH DIFFERENTIAL/PLATELET  TROPONIN I (HIGH SENSITIVITY)  TROPONIN I (HIGH SENSITIVITY)    EKG EKG Interpretation  Date/Time:  Monday July 10 2020 12:15:50 EST Ventricular Rate:  74 PR Interval:    QRS Duration: 90 QT Interval:  446 QTC Calculation: 495 R Axis:   73 Text Interpretation: Sinus rhythm Repol abnrm, global ischemia, diffuse leads No significant change since last tracing Confirmed by Davonna Belling [phone]) on 07/10/2020 12:22:52 PM   Radiology CT Head Wo Contrast  Result Date: 07/10/2020 CLINICAL DATA:  Head trauma, abnormal mental status. Witness seizure. EXAM: CT HEAD WITHOUT CONTRAST TECHNIQUE: Contiguous axial images were obtained from the base of the skull through the vertex without intravenous contrast. COMPARISON:  Head CT 03/26/2020. FINDINGS: Brain: Cerebral volume is normal. There is no acute intracranial hemorrhage. No demarcated cortical infarct. No extra-axial fluid collection. No evidence of intracranial mass. No midline shift. Vascular: No hyperdense vessel. Skull: Normal. Negative for fracture or focal lesion. Sinuses/Orbits: Visualized orbits show no acute finding. 2.7 cm left maxillary sinus mucous  retention cyst. Background trace mucosal thickening within the bilateral maxillary sinuses. Mild partial opacification of anterior right ethmoid air cells. IMPRESSION: No evidence of acute intracranial abnormality. Paranasal sinus disease as described, most notably a 2.7 cm mucous retention cyst within the left maxillary sinus. Electronically Signed   By: Kellie Simmering DO   On: 07/10/2020 14:26   DG Chest Portable 1 View  Result Date: 07/10/2020 CLINICAL DATA:  Altered mental status, seizure today EXAM: PORTABLE CHEST 1 VIEW COMPARISON:  Portable exam 1221 hours compared to 06/04/2020 FINDINGS: Minimal enlargement of cardiac silhouette. Mediastinal contours and pulmonary vascularity normal. Lungs clear. No infiltrate, pleural effusion, or pneumothorax. Osseous structures unremarkable. IMPRESSION: No acute abnormalities. Electronically Signed   By: Lavonia Dana M.D.   On: 07/10/2020 12:31    Procedures Procedures   Medications Ordered in ED Medications - No data to display  ED Course  I have reviewed the triage vital signs and the nursing notes.  Pertinent labs & imaging results that were available during my care of the patient were reviewed by me and considered in my medical decision making (see chart for details).    MDM Rules/Calculators/A&P                          Patient presents with seizure after being in the heart failure office.  Witnessed tonic-clonic seizure.  Thought to be related secondary to alcohol withdrawal.  Has been drinking heavily previously but has been off the last few days.  Had been tremorous prior.  Initially it started as a level 2 trauma since thought that he was on Xarelto, however has been off that for a week or 2.  Head CT reassuring.  Lab work overall reassuring although CBC is being recollected at this time.  With seizure related to alcohol I feel the patient would benefit from admission to the hospital, for further monitoring to watch for decompensation.   Reportedly also has been having syncopal episodes besides this that  appeared not to be seizures.  The plan reportedly had been for cardiac monitoring as an outpatient.  Echocardiogram had been done today but not read yet. Final Clinical Impression(s) / ED Diagnoses Final diagnoses:  Seizure ([place])  Alcohol withdrawal syndrome with complication ([place])  Syncope, unspecified syncope type    Rx / DC Orders ED Discharge Orders    None       Davonna Belling, MD 07/10/20 [phone]

## 2020-07-11 DIAGNOSIS — F10239 Alcohol dependence with withdrawal, unspecified: Principal | ICD-10-CM

## 2020-07-11 LAB — BASIC METABOLIC PANEL
Anion gap: 11 (ref 5–15)
BUN: 14 mg/dL (ref 6–20)
CO2: 24 mmol/L (ref 22–32)
Calcium: 8.9 mg/dL (ref 8.9–10.3)
Chloride: 101 mmol/L (ref 98–111)
Creatinine, Ser: 1.2 mg/dL (ref 0.61–1.24)
GFR, Estimated: >60 mL/min (ref >60)
Glucose, Bld: 87 mg/dL (ref 70–99)
Potassium: 3.3 mmol/L — ABNORMAL LOW (ref 3.5–5.1)
Sodium: 136 mmol/L (ref 135–145)

## 2020-07-11 LAB — SARS CORONAVIRUS 2 (TAT 6-24 HRS): SARS Coronavirus 2: NEGATIVE

## 2020-07-11 MED ORDER — QUETIAPINE FUMARATE 50 MG PO TABS
150.0000 mg | ORAL_TABLET | Freq: Every day | ORAL | Status: DC
  Administered 2020-07-11: 150 mg via ORAL
  Filled 2020-07-11: qty 1

## 2020-07-11 NOTE — TOC Initial Note (Signed)
Transition of Care [facility]) - Initial/Assessment Note    Patient Details  Name: Robert [name] MRN: [id number] Date of Birth: [birth year]  Transition of Care [facility]) CM/SW Contact:    Robert Mayo, RN Phone Number: 07/11/2020, 1:30 PM  Clinical Narrative:                 NCM spoke with patient at bedside , gave him SA resources. He lives with his mother, he states he has bp cuff and scales at home. He states he had seizure yesterday, he was hoping that he would be able to go home.  He has PCP and an apt is already set up for Next Monday.    Expected Discharge Plan: Home/Self Care Barriers to Discharge: Continued Medical Work up   Patient Goals and CMS Choice Patient states their goals for this hospitalization and ongoing recovery are:: staying alive   Choice offered to / list presented to : NA  Expected Discharge Plan and Services Expected Discharge Plan: Home/Self Care In-house Referral: NA Discharge Planning Services: CM Consult Post Acute Care Choice: NA                     DME Agency: NA       HH Arranged: NA          Prior Living Arrangements/Services   Lives with:: Parents Patient language and need for interpreter reviewed:: Yes Do you feel safe going back to the place where you live?: Yes      Need for Family Participation in Patient Care: Yes (Comment) Care giver support system in place?: Yes (comment) Current home services: DME (has a scale , bp cuff) Criminal Activity/Legal Involvement Pertinent to Current Situation/Hospitalization: No - Comment as needed  Activities of Daily Living      Permission Sought/Granted                  Emotional Assessment Appearance:: Appears stated age Attitude/Demeanor/Rapport: Engaged Affect (typically observed): Appropriate Orientation: : Oriented to Self,Oriented to Place,Oriented to  Time,Oriented to Situation Alcohol / Substance Use: Not Applicable Psych Involvement: No (comment)  Admission  diagnosis:  Seizure ([place]) [R56.9] Alcohol withdrawal syndrome with complication ([place]) [Q59.563] Syncope, unspecified syncope type [R55] Patient Active Problem List   Diagnosis Date Noted  . Seizure ([place]) 07/10/2020  . Mood disorder ([place]) 05/16/2020  . Hypotension 03/24/2020  . Polysubstance abuse ([place]) 03/24/2020  . Nausea & vomiting 03/06/2020  . AKI (acute kidney injury) ([place]) 10/30/2019  . Gastritis 10/30/2019  . Elevated INR 10/30/2019  . 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[place])   . Acute heart failure ([place]) 10/05/2019  . Pulmonary embolism ([place]) 10/04/2019  . Acute pulmonary embolism ([place]) 10/04/2019  . Chronic systolic CHF (congestive heart failure) ([place])   . Inappropriate sinus tachycardia   . Chest pain 11/03/2013  . Tobacco abuse 11/03/2013   PCP:  Robert Buffy, NP Pharmacy:   [facility] [address] [place] [address] Phone: [phone] Fax: [id number] - [place], [place] - [address] AT [place] [address] [place] [address] Phone: [phone] Fax: [phone]     Social Determinants of Health (SDOH) Interventions    Readmission Risk Interventions Readmission Risk Prevention Plan 07/11/2020  Transportation Screening Complete  Medication Review (RN Care Manager) Complete  PCP or Specialist appointment within  3-5 days of discharge Complete  HRI or Home Care Consult Complete  SW Recovery Care/Counseling Consult Complete  Palliative Care Screening Not [place] Not Applicable  Some recent data might be hidden

## 2020-07-11 NOTE — Progress Notes (Signed)
PROGRESS NOTE    Mylik Pro  TDV:[id number] DOB: January 01, 1981 DOA: 07/10/2020 PCP: Flossie Buffy, NP   Brief Narrative:  Robert [name] is a 40 y.o. male with medical history significant of nonischemic cardiomyopathy, chronic biventricular heart failure, profound alcohol abuse, HTN, PE on Xarelto, CKD stage I, IIDM  who presented with acute witnessed tonic-clonic seizure from cardiology physician office.  Patient admits to drinking approximately 6 many liquor bottles daily on average.  He denies any previous history of withdrawal or withdrawal seizure.  Assessment & Plan:   Active Problems:   Seizure ([place])   Acute provoked seizure in the setting of alcohol withdrawal Concurrent profound alcohol use/abuse -6 mini liquor bottles daily, attempting to cut back over the past week  -Denies any history of seizure  -No current indication for antiepileptics given provoked seizure -Continues to score on CIWA protocol, discussed likely need for ongoing inpatient evaluation and treatment. -Patient does hesitantly agree to speak about alcohol cessation.  We will have case management follow-up.  History of nonischemic/alcoholic cardiomyopathy with chronic systolic CHF -Euvolemic, continue home meds.  History of PE -Continue Xarelto   Anxiety/depression -Continue SSRI and Seroquel  Small laceration to the back secondary to above seizure and fall -Wound care at bedside, bandage clean dry intact  Insulin-dependent diabetes -Continue home meds   CKD stage I -Euvolumic.  DVT prophylaxis: Xarelto Code Status: Full Code Family Communication:  None present  Status is: Inpatient  Dispo: The patient is from: Home              Anticipated d/c is to: Home              Anticipated d/c date is: 24 to 48 hours pending clinical prognosis and improvement              Patient currently not medically stable for discharge  Consultants:   None  Procedures:    None  Antimicrobials:  None indicated  Subjective: No acute issues or events overnight, patient ANO x4 this morning back to baseline, does not recall specific events but is more open to discuss alcohol cessation.  Denies nausea vomiting diarrhea constipation headache fevers or chills.  Objective: Vitals:   07/10/20 2000 07/10/20 2115 07/10/20 2344 07/11/20 0357  BP: 136/88 134/82 113/85 (!) 133/92  Pulse: 91 88 87 79  Resp: (!) 22  (!) 21 20  Temp: 98.2 F (36.8 C)  98 F (36.7 C) 98 F (36.7 C)  TempSrc: Oral  Oral Oral  SpO2: 100%  98% 99%  Weight:      Height:       No intake or output data in the 24 hours ending 07/11/20 0626 Filed Weights   07/10/20 1249  Weight: 87.9 kg    Examination:  General exam: Appears calm and comfortable  Respiratory system: Clear to auscultation. Respiratory effort normal. Cardiovascular system: S1 & S2 heard, RRR. No JVD, murmurs, rubs, gallops or clicks. No pedal edema. Gastrointestinal system: Abdomen is nondistended, soft and nontender. No organomegaly or masses felt. Normal bowel sounds heard. Central nervous system: Alert and oriented. No focal neurological deficits. Extremities: Symmetric 5 x 5 power. Skin: No rashes, lesions or ulcers Psychiatry: Judgement and insight appear normal. Mood & affect appropriate.     Data Reviewed: I have personally reviewed following labs and imaging studies  CBC: Recent Labs  Lab 07/10/20 1416  WBC 5.9  NEUTROABS 4.7  HGB 11.4*  HCT 35.3*  MCV 92.7  PLT  956*   Basic Metabolic Panel: Recent Labs  Lab 07/10/20 1216 07/11/20 0350  NA 136 136  K 4.2 3.3*  CL 97* 101  CO2 16* 24  GLUCOSE 170* 87  BUN 16 14  CREATININE 1.46* 1.20  CALCIUM 9.2 8.9   GFR: Estimated Creatinine Clearance: 93.3 mL/min (by C-G formula based on SCr of 1.2 mg/dL). Liver Function Tests: Recent Labs  Lab 07/10/20 1216  AST 58*  ALT 72*  ALKPHOS 46  BILITOT 1.3*  PROT 7.3  ALBUMIN 4.1   No  results for input(s): LIPASE, AMYLASE in the last 168 hours. No results for input(s): AMMONIA in the last 168 hours. Coagulation Profile: Recent Labs  Lab 07/10/20 1216  INR 1.1   Cardiac Enzymes: No results for input(s): CKTOTAL, CKMB, CKMBINDEX, TROPONINI in the last 168 hours. BNP (last 3 results) No results for input(s): PROBNP in the last 8760 hours. HbA1C: No results for input(s): HGBA1C in the last 72 hours. CBG: No results for input(s): GLUCAP in the last 168 hours. Lipid Profile: No results for input(s): CHOL, HDL, LDLCALC, TRIG, CHOLHDL, LDLDIRECT in the last 72 hours. Thyroid Function Tests: No results for input(s): TSH, T4TOTAL, FREET4, T3FREE, THYROIDAB in the last 72 hours. Anemia Panel: No results for input(s): VITAMINB12, FOLATE, FERRITIN, TIBC, IRON, RETICCTPCT in the last 72 hours. Sepsis Labs: No results for input(s): PROCALCITON, LATICACIDVEN in the last 168 hours.  Recent Results (from the past 240 hour(s))  SARS CORONAVIRUS 2 (TAT 6-24 HRS) Nasopharyngeal Nasopharyngeal Swab     Status: None   Collection Time: 07/10/20  4:17 PM   Specimen: Nasopharyngeal Swab  Result Value Ref Range Status   SARS Coronavirus 2 NEGATIVE NEGATIVE Final    Comment: (NOTE) SARS-CoV-2 target nucleic acids are NOT DETECTED.  The SARS-CoV-2 RNA is generally detectable in upper and lower respiratory specimens during the acute phase of infection. Negative results do not preclude SARS-CoV-2 infection, do not rule out co-infections with other pathogens, and should not be used as the sole basis for treatment or other patient management decisions. Negative results must be combined with clinical observations, patient history, and epidemiological information. The expected result is Negative.  Fact Sheet for Patients: SugarRoll.be  Fact Sheet for Healthcare Providers: https://www.woods-mathews.com/  This test is not yet approved or cleared  by the [place] FDA and  has been authorized for detection and/or diagnosis of SARS-CoV-2 by FDA under an Emergency Use Authorization (EUA). This EUA will remain  in effect (meaning this test can be used) for the duration of the COVID-19 declaration under Se ction 564(b)(1) of the Act, 21 U.S.C. section 360bbb-3(b)(1), unless the authorization is terminated or revoked sooner.  Performed at [facility], [place] [address], [place], [place] [address]          Radiology Studies: CT Head Wo Contrast  Result Date: 07/10/2020 CLINICAL DATA:  Head trauma, abnormal mental status. Witness seizure. EXAM: CT HEAD WITHOUT CONTRAST TECHNIQUE: Contiguous axial images were obtained from the base of the skull through the vertex without intravenous contrast. COMPARISON:  Head CT 03/26/2020. FINDINGS: Brain: Cerebral volume is normal. There is no acute intracranial hemorrhage. No demarcated cortical infarct. No extra-axial fluid collection. No evidence of intracranial mass. No midline shift. Vascular: No hyperdense vessel. Skull: Normal. Negative for fracture or focal lesion. Sinuses/Orbits: Visualized orbits show no acute finding. 2.7 cm left maxillary sinus mucous retention cyst. Background trace mucosal thickening within the bilateral maxillary sinuses. Mild partial opacification of anterior right  ethmoid air cells. IMPRESSION: No evidence of acute intracranial abnormality. Paranasal sinus disease as described, most notably a 2.7 cm mucous retention cyst within the left maxillary sinus. Electronically Signed   By: Kellie Simmering DO   On: 07/10/2020 14:26   DG Chest Portable 1 View  Result Date: 07/10/2020 CLINICAL DATA:  Altered mental status, seizure today EXAM: PORTABLE CHEST 1 VIEW COMPARISON:  Portable exam 1221 hours compared to 06/04/2020 FINDINGS: Minimal enlargement of cardiac silhouette. Mediastinal contours and pulmonary vascularity normal. Lungs clear. No infiltrate, pleural effusion, or  pneumothorax. Osseous structures unremarkable. IMPRESSION: No acute abnormalities. Electronically Signed   By: Lavonia Dana M.D.   On: 07/10/2020 12:31   ECHOCARDIOGRAM COMPLETE  Result Date: 07/10/2020    ECHOCARDIOGRAM REPORT   Patient Name:   NYRON MOZER Seng Date of Exam: 07/10/2020 Medical Rec #:  [id number]          Height:       70.0 in Accession #:    [id number]         Weight:       197.0 lb Date of Birth:  [birth year]           BSA:          2.074 m Patient Age:    50 years           BP:           161/94 mmHg Patient Gender: M                  HR:           54 bpm. Exam Location:  Outpatient Procedure: 2D Echo, 3D Echo, Color Doppler, Cardiac Doppler and Strain Analysis Indications:    H06.23 Chronic systolic (congestive) heart failure  History:        Patient has prior history of Echocardiogram examinations, most                 recent 03/10/2020. CHF.  Sonographer:    Raquel Sarna Senior RDCS Referring Phys: [place]  1. 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     LVEF is severely depressed. Compared to echo from October 2021, no significant change.     Global longitudinal strain is -10.9%. Left ventricular ejection fraction, by estimation, is 30%%. The left ventricle has severely decreased function. The left ventricle demonstrates global hypokinesis. The left ventricular internal cavity size was severely dilated. Left ventricular diastolic parameters are consistent with Grade I diastolic dysfunction (impaired relaxation).  2. Right ventricular systolic function is normal. The right ventricular size is normal.  3. The mitral valve is normal in structure. No evidence of mitral valve regurgitation.  4. The aortic valve is abnormal. Aortic valve regurgitation is not visualized. Mild aortic valve sclerosis is present, with no evidence of aortic valve stenosis. FINDINGS  Left Ventricle: Left ventricular walls are deeply trabeculated, suspicious for  noncompaction. LVEF is severely depressed. Compared to echo from October 2021, no significant change. Global longitudinal strain is -10.9%. Left ventricular ejection fraction, by estimation, is 30%%. The left ventricle has severely decreased function. The left ventricle demonstrates global hypokinesis. The left ventricular internal cavity size was severely dilated. There is no left ventricular hypertrophy. Left ventricular diastolic parameters are consistent with Grade I diastolic dysfunction (impaired relaxation). Right Ventricle: The right ventricular size is normal. Right vetricular wall thickness was not assessed. Right ventricular systolic function is normal. Left Atrium: Left atrial size was normal  in size. Right Atrium: Right atrial size was normal in size. Pericardium: There is no evidence of pericardial effusion. Mitral Valve: The mitral valve is normal in structure. No evidence of mitral valve regurgitation. Tricuspid Valve: The tricuspid valve is normal in structure. Tricuspid valve regurgitation is trivial. Aortic Valve: The aortic valve is abnormal. Aortic valve regurgitation is not visualized. Mild aortic valve sclerosis is present, with no evidence of aortic valve stenosis. Pulmonic Valve: The pulmonic valve was normal in structure. Pulmonic valve regurgitation is not visualized. Aorta: The aortic root and ascending aorta are structurally normal, with no evidence of dilitation. IAS/Shunts: The interatrial septum was not assessed.  LEFT VENTRICLE PLAX 2D LVIDd:         5.80 cm      Diastology LVIDs:         4.30 cm      LV e' medial:    5.66 cm/s LV PW:         1.10 cm      LV E/e' medial:  10.5 LV IVS:        0.90 cm      LV e' lateral:   6.31 cm/s LVOT diam:     2.20 cm      LV E/e' lateral: 9.4 LV SV:         63 LV SV Index:   31 LVOT Area:     3.80 cm                              3D Volume EF: LV Volumes (MOD)            3D EF:        37 % LV vol d, MOD A2C: 129.0 ml LV EDV:       237 ml LV vol d,  MOD A4C: 176.0 ml LV ESV:       149 ml LV vol s, MOD A2C: 77.8 ml  LV SV:        88 ml LV vol s, MOD A4C: 108.0 ml LV SV MOD A2C:     51.2 ml LV SV MOD A4C:     176.0 ml LV SV MOD BP:      59.9 ml RIGHT VENTRICLE RV S prime:     17.50 cm/s TAPSE (M-mode): 2.5 cm LEFT ATRIUM             Index       RIGHT ATRIUM           Index LA diam:        4.40 cm 2.12 cm/m  RA Area:     17.50 cm LA Vol (A2C):   65.6 ml 31.63 ml/m RA Volume:   43.50 ml  20.97 ml/m LA Vol (A4C):   58.4 ml 28.16 ml/m LA Biplane Vol: 65.9 ml 31.77 ml/m  AORTIC VALVE LVOT Vmax:   84.90 cm/s LVOT Vmean:  60.100 cm/s LVOT VTI:    0.167 m  AORTA Ao Root diam: 3.30 cm Ao Asc diam:  3.20 cm MITRAL VALVE MV Area (PHT): 2.07 cm    SHUNTS MV Decel Time: 366 msec    Systemic VTI:  0.17 m MV E velocity: 59.60 cm/s  Systemic Diam: 2.20 cm MV A velocity: 60.00 cm/s MV E/A ratio:  0.99 Dorris Carnes MD Electronically signed by Dorris Carnes MD Signature Date/Time: 07/10/2020/3:56:00 PM    Final  Scheduled Meds: . carvedilol  3.125 mg Oral BID  . dapagliflozin propanediol  10 mg Oral QAC breakfast  . digoxin  0.125 mg Oral Daily  . fluticasone  2 spray Each Nare Daily  . folic acid  1 mg Oral Daily  . ivabradine  7.5 mg Oral BID WC  . LORazepam  0-4 mg Oral Q4H   Followed by  . [START ON 07/12/2020] LORazepam  0-4 mg Oral Q8H  . multivitamin with minerals  1 tablet Oral Daily  . pantoprazole  40 mg Oral Daily  . QUEtiapine  150 mg Oral Daily  . rivaroxaban  20 mg Oral Q supper  . sacubitril-valsartan  1 tablet Oral BID  . sertraline  25 mg Oral Daily  . spironolactone  25 mg Oral Daily  . sucralfate  1 g Oral TID WC & HS  . thiamine  100 mg Oral Daily   Continuous Infusions: . sodium chloride 75 mL/hr (07/10/20 1800)     LOS: 1 day   Time spent: 30min  Hargun Spurling C Ferlando Lia, DO [facility]  If 7PM-7AM, please contact night-coverage [email or web address]  07/11/2020, 7:22 AM

## 2020-07-12 ENCOUNTER — Telehealth (HOSPITAL_COMMUNITY): Payer: Self-pay | Admitting: Licensed Clinical Social Worker

## 2020-07-12 DIAGNOSIS — R55 Syncope and collapse: Secondary | ICD-10-CM

## 2020-07-12 LAB — CBC
HCT: 28.3 % — ABNORMAL LOW (ref 39.0–52.0)
Hemoglobin: 9.6 g/dL — ABNORMAL LOW (ref 13.0–17.0)
MCH: 31.1 pg (ref 26.0–34.0)
MCHC: 33.9 g/dL (ref 30.0–36.0)
MCV: 91.6 fL (ref 80.0–100.0)
Platelets: 142 10*3/uL — ABNORMAL LOW (ref 150–400)
RBC: 3.09 MIL/uL — ABNORMAL LOW (ref 4.22–5.81)
RDW: 14.5 % (ref 11.5–15.5)
WBC: 4 10*3/uL (ref 4.0–10.5)
nRBC: 0 % (ref 0.0–0.2)

## 2020-07-12 LAB — COMPREHENSIVE METABOLIC PANEL
ALT: 49 U/L — ABNORMAL HIGH (ref 0–44)
AST: 38 U/L (ref 15–41)
Albumin: 3.2 g/dL — ABNORMAL LOW (ref 3.5–5.0)
Alkaline Phosphatase: 45 U/L (ref 38–126)
Anion gap: 10 (ref 5–15)
BUN: 16 mg/dL (ref 6–20)
CO2: 22 mmol/L (ref 22–32)
Calcium: 8.5 mg/dL — ABNORMAL LOW (ref 8.9–10.3)
Chloride: 107 mmol/L (ref 98–111)
Creatinine, Ser: 1.13 mg/dL (ref 0.61–1.24)
GFR, Estimated: >60 mL/min (ref >60)
Glucose, Bld: 102 mg/dL — ABNORMAL HIGH (ref 70–99)
Potassium: 3.6 mmol/L (ref 3.5–5.1)
Sodium: 139 mmol/L (ref 135–145)
Total Bilirubin: 0.5 mg/dL (ref 0.3–1.2)
Total Protein: 5.6 g/dL — ABNORMAL LOW (ref 6.5–8.1)

## 2020-07-12 MED ORDER — CHLORDIAZEPOXIDE HCL 5 MG PO CAPS: 5.0000 mg | ORAL_CAPSULE | Freq: Three times a day (TID) | ORAL | 0 refills | Status: DC | PRN

## 2020-07-12 NOTE — Discharge Summary (Signed)
Physician Discharge Summary  Robert [name] [id number] DOB: November 25, 1980 DOA: 07/10/2020  PCP: Flossie Buffy, NP  Admit date: 07/10/2020 Discharge date: 07/12/2020  Admitted From: home Disposition:  home  Recommendations for Outpatient Follow-up:  1. Follow up with PCP in 1-2 weeks  Home Health: none Equipment/Devices: none  Discharge Condition: stable CODE STATUS: Full code Diet recommendation: heart healthy  HPI: Per admitting MD, Robert [name] is a 40 y.o. male with medical history significant of nonischemic cardiomyopathy, chronic biventricular heart failure, alcohol abuse, HTN, PE on Xarelto, CKD stage I, IIDM scented with seizure. Last drink 3 days ago, 5 glasses of vodka.  Started to feel shaky and nausea yesterday vomited 2 times with normal content no bile no blood.  Denies any chest pain shortness of breath.  Went to see cardiology this morning for routine follow-up, had 1 witnessed tonic-clonic seizure while inside cardiologist office, patient fell backward and hit his head then started to have whole body shaking, elbow rolling to the back and was found to have Urinary incontinency.  Seizure stopped spontaneously patient became postictal with confusion.  Patient denied any headache  Hospital Course / Discharge diagnoses: Principal problem Acute provoked seizure in the setting of alcohol withdrawal Concurrent profound alcohol use/abuse -patient was admitted to the hospital with a seizure in the setting of alcohol cessation.  He has no history of seizures in the past.  He was monitored in the hospital, his withdrawal is improving, he has been seizure-free, he is now determined to quit EtOH altogether.  He was given resources by case management.  No current indication for antiepileptics given provoked seizure and desire to stay off alcohol.  Will be given short course of Librium  Active problems History of nonischemic/alcoholic cardiomyopathy with chronic systolic  CHF -Euvolemic, continue home meds. History of PE -Continue Xarelto  Anxiety/depression -Continue SSRI and Seroquel Small laceration to the back secondary to above seizure and fall -Wound care at bedside, bandage clean dry intact Insulin-dependent diabetes -Continue home meds  CKD stage I -Euvolumic.  Sepsis ruled out   Discharge Instructions   Allergies as of 07/12/2020      Reactions   Bactrim [sulfamethoxazole-trimethoprim] Rash      Medication List    TAKE these medications   carvedilol 3.125 MG tablet Commonly known as: Coreg Take 1 tablet (3.125 mg total) by mouth 2 (two) times daily.   chlordiazePOXIDE 5 MG capsule Commonly known as: LIBRIUM Take 1 capsule (5 mg total) by mouth 3 (three) times daily as needed for anxiety.   dapagliflozin propanediol 10 MG Tabs tablet Commonly known as: Farxiga Take 1 tablet (10 mg total) by mouth daily before breakfast.   digoxin 0.125 MG tablet Commonly known as: LANOXIN Take 1 tablet (0.125 mg total) by mouth daily.   Entresto 97-103 MG Generic drug: sacubitril-valsartan Take 1 tablet by mouth 2 (two) times daily.   folic acid 1 MG tablet Commonly known as: FOLVITE Take 1 tablet (1 mg total) by mouth daily.   ivabradine 7.5 MG Tabs tablet Commonly known as: CORLANOR Take 1 tablet (7.5 mg total) by mouth 2 (two) times daily with a meal.   ondansetron 4 MG disintegrating tablet Commonly known as: Zofran ODT Take 1 tablet (4 mg total) by mouth every 8 (eight) hours as needed for nausea or vomiting.   pantoprazole 40 MG tablet Commonly known as: PROTONIX Take 1 tablet (40 mg total) by mouth daily.   QUEtiapine 100 MG tablet Commonly known  as: SEROQUEL Take 1.5 tablets (150 mg total) by mouth daily. What changed:   when to take this  reasons to take this   rivaroxaban 20 MG Tabs tablet Commonly known as: XARELTO Take 20 mg by mouth every evening.   sertraline 25 MG tablet Commonly known as: Zoloft Take 1  tablet (25 mg total) by mouth daily.   spironolactone 25 MG tablet Commonly known as: ALDACTONE Take 1 tablet (25 mg total) by mouth daily. HOLD until 03/26/20   sucralfate 1 g tablet Commonly known as: Carafate Take 1 tablet (1 g total) by mouth 4 (four) times daily -  with meals and at bedtime.   thiamine 100 MG tablet Take 1 tablet (100 mg total) by mouth daily.       Follow-up Information    Nche, Charlene Brooke, NP Follow up on 07/17/2020.   Specialty: Internal Medicine Why: apt is already set up Contact information: [place] [id number] [phone]               Consultations:  None   Procedures/Studies:  CT Head Wo Contrast  Result Date: 07/10/2020 CLINICAL DATA:  Head trauma, abnormal mental status. Witness seizure. EXAM: CT HEAD WITHOUT CONTRAST TECHNIQUE: Contiguous axial images were obtained from the base of the skull through the vertex without intravenous contrast. COMPARISON:  Head CT 03/26/2020. FINDINGS: Brain: Cerebral volume is normal. There is no acute intracranial hemorrhage. No demarcated cortical infarct. No extra-axial fluid collection. No evidence of intracranial mass. No midline shift. Vascular: No hyperdense vessel. Skull: Normal. Negative for fracture or focal lesion. Sinuses/Orbits: Visualized orbits show no acute finding. 2.7 cm left maxillary sinus mucous retention cyst. Background trace mucosal thickening within the bilateral maxillary sinuses. Mild partial opacification of anterior right ethmoid air cells. IMPRESSION: No evidence of acute intracranial abnormality. Paranasal sinus disease as described, most notably a 2.7 cm mucous retention cyst within the left maxillary sinus. Electronically Signed   By: Kellie Simmering DO   On: 07/10/2020 14:26   DG Chest Portable 1 View  Result Date: 07/10/2020 CLINICAL DATA:  Altered mental status, seizure today EXAM: PORTABLE CHEST 1 VIEW COMPARISON:  Portable exam 1221 hours compared  to 06/04/2020 FINDINGS: Minimal enlargement of cardiac silhouette. Mediastinal contours and pulmonary vascularity normal. Lungs clear. No infiltrate, pleural effusion, or pneumothorax. Osseous structures unremarkable. IMPRESSION: No acute abnormalities. Electronically Signed   By: Lavonia Dana M.D.   On: 07/10/2020 12:31   ECHOCARDIOGRAM COMPLETE  Result Date: 07/10/2020    ECHOCARDIOGRAM REPORT   Patient Name:   RAYHAAN HUSTER Blatz Date of Exam: 07/10/2020 Medical Rec #:  [id number]          Height:       70.0 in Accession #:    [id number]         Weight:       197.0 lb Date of Birth:  11/04/1980           BSA:          2.074 m Patient Age:    75 years           BP:           161/94 mmHg Patient Gender: M                  HR:           54 bpm. Exam Location:  Outpatient Procedure: 2D Echo, 3D Echo, Color Doppler, Cardiac Doppler and Strain Analysis  Indications:    Q75.91 Chronic systolic (congestive) heart failure  History:        Patient has prior history of Echocardiogram examinations, most                 recent 03/10/2020. CHF.  Sonographer:    Raquel Sarna Senior RDCS Referring Phys: [place]  1. 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     LVEF is severely depressed. Compared to echo from October 2021, no significant change.     Global longitudinal strain is -10.9%. Left ventricular ejection fraction, by estimation, is 30%%. The left ventricle has severely decreased function. The left ventricle demonstrates global hypokinesis. The left ventricular internal cavity size was severely dilated. Left ventricular diastolic parameters are consistent with Grade I diastolic dysfunction (impaired relaxation).  2. Right ventricular systolic function is normal. The right ventricular size is normal.  3. The mitral valve is normal in structure. No evidence of mitral valve regurgitation.  4. The aortic valve is abnormal. Aortic valve regurgitation is not visualized.  Mild aortic valve sclerosis is present, with no evidence of aortic valve stenosis. FINDINGS  Left Ventricle: Left ventricular walls are deeply trabeculated, suspicious for noncompaction. LVEF is severely depressed. Compared to echo from October 2021, no significant change. Global longitudinal strain is -10.9%. Left ventricular ejection fraction, by estimation, is 30%%. The left ventricle has severely decreased function. The left ventricle demonstrates global hypokinesis. The left ventricular internal cavity size was severely dilated. There is no left ventricular hypertrophy. Left ventricular diastolic parameters are consistent with Grade I diastolic dysfunction (impaired relaxation). Right Ventricle: The right ventricular size is normal. Right vetricular wall thickness was not assessed. Right ventricular systolic function is normal. Left Atrium: Left atrial size was normal in size. Right Atrium: Right atrial size was normal in size. Pericardium: There is no evidence of pericardial effusion. Mitral Valve: The mitral valve is normal in structure. No evidence of mitral valve regurgitation. Tricuspid Valve: The tricuspid valve is normal in structure. Tricuspid valve regurgitation is trivial. Aortic Valve: The aortic valve is abnormal. Aortic valve regurgitation is not visualized. Mild aortic valve sclerosis is present, with no evidence of aortic valve stenosis. Pulmonic Valve: The pulmonic valve was normal in structure. Pulmonic valve regurgitation is not visualized. Aorta: The aortic root and ascending aorta are structurally normal, with no evidence of dilitation. IAS/Shunts: The interatrial septum was not assessed.  LEFT VENTRICLE PLAX 2D LVIDd:         5.80 cm      Diastology LVIDs:         4.30 cm      LV e' medial:    5.66 cm/s LV PW:         1.10 cm      LV E/e' medial:  10.5 LV IVS:        0.90 cm      LV e' lateral:   6.31 cm/s LVOT diam:     2.20 cm      LV E/e' lateral: 9.4 LV SV:         63 LV SV Index:   31  LVOT Area:     3.80 cm                              3D Volume EF: LV Volumes (MOD)            3D EF:  37 % LV vol d, MOD A2C: 129.0 ml LV EDV:       237 ml LV vol d, MOD A4C: 176.0 ml LV ESV:       149 ml LV vol s, MOD A2C: 77.8 ml  LV SV:        88 ml LV vol s, MOD A4C: 108.0 ml LV SV MOD A2C:     51.2 ml LV SV MOD A4C:     176.0 ml LV SV MOD BP:      59.9 ml RIGHT VENTRICLE RV S prime:     17.50 cm/s TAPSE (M-mode): 2.5 cm LEFT ATRIUM             Index       RIGHT ATRIUM           Index LA diam:        4.40 cm 2.12 cm/m  RA Area:     17.50 cm LA Vol (A2C):   65.6 ml 31.63 ml/m RA Volume:   43.50 ml  20.97 ml/m LA Vol (A4C):   58.4 ml 28.16 ml/m LA Biplane Vol: 65.9 ml 31.77 ml/m  AORTIC VALVE LVOT Vmax:   84.90 cm/s LVOT Vmean:  60.100 cm/s LVOT VTI:    0.167 m  AORTA Ao Root diam: 3.30 cm Ao Asc diam:  3.20 cm MITRAL VALVE MV Area (PHT): 2.07 cm    SHUNTS MV Decel Time: 366 msec    Systemic VTI:  0.17 m MV E velocity: 59.60 cm/s  Systemic Diam: 2.20 cm MV A velocity: 60.00 cm/s MV E/A ratio:  0.99 Dorris Carnes MD Electronically signed by Dorris Carnes MD Signature Date/Time: 07/10/2020/3:56:00 PM    Final    US Abdomen Limited RUQ (LIVER/GB)  Result Date: 06/30/2020 CLINICAL DATA:  Right upper quadrant abdominal pain with nausea and vomiting. EXAM: ULTRASOUND ABDOMEN LIMITED RIGHT UPPER QUADRANT COMPARISON:  None. FINDINGS: Gallbladder: No gallstones or wall thickening visualized. No sonographic Murphy sign noted by sonographer. Common bile duct: Diameter: 2.7 mm Liver: There is diffuse increased echogenicity of the liver and decreased through transmission consistent with fatty infiltration. No focal lesions or biliary dilatation. Portal vein is patent on color Doppler imaging with normal direction of blood flow towards the liver. Other: None. IMPRESSION: 1. Normal gallbladder and normal caliber common bile duct. 2. Diffuse fatty infiltration of the liver.  No hepatic lesions. Electronically Signed    By: Marijo Sanes M.D.   On: 06/30/2020 12:20      Subjective: - no chest pain, shortness of breath, no abdominal pain, nausea or vomiting.   Discharge Exam: BP (!) 137/98 (BP Location: Left Arm)   Pulse 81   Temp 98.5 F (36.9 C) (Oral)   Resp 18   Ht 5\' 10"  (1.778 m)   Wt 87.9 kg   SpO2 100%   BMI 27.81 kg/m   General: Pt is alert, awake, not in acute distress Cardiovascular: RRR, S1/S2 +, no rubs, no gallops Respiratory: CTA bilaterally, no wheezing, no rhonchi Abdominal: Soft, NT, ND, bowel sounds + Extremities: no edema, no cyanosis   The results of significant diagnostics from this hospitalization (including imaging, microbiology, ancillary and laboratory) are listed below for reference.     Microbiology: Recent Results (from the past 240 hour(s))  SARS CORONAVIRUS 2 (TAT 6-24 HRS) Nasopharyngeal Nasopharyngeal Swab     Status: None   Collection Time: 07/10/20  4:17 PM   Specimen: Nasopharyngeal Swab  Result Value Ref Range Status  SARS Coronavirus 2 NEGATIVE NEGATIVE Final    Comment: (NOTE) SARS-CoV-2 target nucleic acids are NOT DETECTED.  The SARS-CoV-2 RNA is generally detectable in upper and lower respiratory specimens during the acute phase of infection. Negative results do not preclude SARS-CoV-2 infection, do not rule out co-infections with other pathogens, and should not be used as the sole basis for treatment or other patient management decisions. Negative results must be combined with clinical observations, patient history, and epidemiological information. The expected result is Negative.  Fact Sheet for Patients: SugarRoll.be  Fact Sheet for Healthcare Providers: https://www.woods-mathews.com/  This test is not yet approved or cleared by the [place] FDA and  has been authorized for detection and/or diagnosis of SARS-CoV-2 by FDA under an Emergency Use Authorization (EUA). This EUA will remain   in effect (meaning this test can be used) for the duration of the COVID-19 declaration under Se ction 564(b)(1) of the Act, 21 U.S.C. section 360bbb-3(b)(1), unless the authorization is terminated or revoked sooner.  Performed at [facility], [place] [address] [place], [place] [address]      Labs: Basic Metabolic Panel: Recent Labs  Lab 07/10/20 1216 07/11/20 0350 07/12/20 0315  NA 136 136 139  K 4.2 3.3* 3.6  CL 97* 101 107  CO2 16* 24 22  GLUCOSE 170* 87 102*  BUN 16 14 16   CREATININE 1.46* 1.20 1.13  CALCIUM 9.2 8.9 8.5*   Liver Function Tests: Recent Labs  Lab 07/10/20 1216 07/12/20 0315  AST 58* 38  ALT 72* 49*  ALKPHOS 46 45  BILITOT 1.3* 0.5  PROT 7.3 5.6*  ALBUMIN 4.1 3.2*   CBC: Recent Labs  Lab 07/10/20 1416 07/12/20 0315  WBC 5.9 4.0  NEUTROABS 4.7  --   HGB 11.4* 9.6*  HCT 35.3* 28.3*  MCV 92.7 91.6  PLT 145* 142*   CBG: No results for input(s): GLUCAP in the last 168 hours. Hgb A1c No results for input(s): HGBA1C in the last 72 hours. Lipid Profile No results for input(s): CHOL, HDL, LDLCALC, TRIG, CHOLHDL, LDLDIRECT in the last 72 hours. Thyroid function studies No results for input(s): TSH, T4TOTAL, T3FREE, THYROIDAB in the last 72 hours.  Invalid input(s): FREET3 Urinalysis No results found for: COLORURINE, APPEARANCEUR, LABSPEC, PHURINE, GLUCOSEU, HGBUR, BILIRUBINUR, KETONESUR, PROTEINUR, UROBILINOGEN, NITRITE, LEUKOCYTESUR  FURTHER DISCHARGE INSTRUCTIONS:   Get Medicines reviewed and adjusted: Please take all your medications with you for your next visit with your Primary MD   Laboratory/radiological data: Please request your Primary MD to go over all hospital tests and procedure/radiological results at the follow up, please ask your Primary MD to get all Hospital records sent to his/her office.   In some cases, they will be blood work, cultures and biopsy results pending at the time of your discharge. Please request that  your primary care M.D. goes through all the records of your hospital data and follows up on these results.   Also Note the following: If you experience worsening of your admission symptoms, develop shortness of breath, life threatening emergency, suicidal or homicidal thoughts you must seek medical attention immediately by calling 911 or calling your MD immediately  if symptoms less severe.   You must read complete instructions/literature along with all the possible adverse reactions/side effects for all the Medicines you take and that have been prescribed to you. Take any new Medicines after you have completely understood and accpet all the possible adverse reactions/side effects.    Do not drive when  taking Pain medications or sleeping medications (Benzodaizepines)   Do not take more than prescribed Pain, Sleep and Anxiety Medications. It is not advisable to combine anxiety,sleep and pain medications without talking with your primary care practitioner   Special Instructions: If you have smoked or chewed Tobacco  in the last 2 yrs please stop smoking, stop any regular Alcohol  and or any Recreational drug use.   Wear Seat belts while driving.   Please note: You were cared for by a hospitalist during your hospital stay. Once you are discharged, your primary care physician will handle any further medical issues. Please note that NO REFILLS for any discharge medications will be authorized once you are discharged, as it is imperative that you return to your primary care physician (or establish a relationship with a primary care physician if you do not have one) for your post hospital discharge needs so that they can reassess your need for medications and monitor your lab values.  Time coordinating discharge: 40 minutes  SIGNED:  Marzetta Board, MD, PhD 07/12/2020, 9:41 AM

## 2020-07-12 NOTE — TOC Transition Note (Signed)
Transition of Care [facility]) - CM/SW Discharge Note   Patient Details  Name: Robert [name] MRN: [id number] Date of Birth: [birth year]  [facility] CM/SW Contact:  Pollie Friar, RN Phone Number: 07/12/2020, 10:29 AM   Clinical Narrative:    Pt discharging home with self care. No further needs per TOC.   Final next level of care: Home/Self Care Barriers to Discharge: No Barriers Identified   Patient Goals and CMS Choice Patient states their goals for this hospitalization and ongoing recovery are:: staying alive   Choice offered to / list presented to : NA  Discharge Placement                       Discharge Plan and Services In-house Referral: NA Discharge Planning Services: CM Consult Post Acute Care Choice: NA            DME Agency: NA       HH Arranged: NA          Social Determinants of Health (SDOH) Interventions     Readmission Risk Interventions Readmission Risk Prevention Plan 07/11/2020  Transportation Screening Complete  Medication Review [employer]) Complete  PCP or Specialist appointment within 3-5 days of discharge Complete  HRI or [place] Complete  SW Recovery Care/Counseling Consult Complete  [place] Not Applicable  Some recent data might be hidden

## 2020-07-12 NOTE — Telephone Encounter (Signed)
CSW called pt to follow up post DC from the hospital.  Pt reports he is feeling better at this time.  Still agreeable to going to [facility] for alcohol abuse and depression counseling.  CSW called [facility] with pt- they will have intake coordinator call him this afternoon to complete initial interview and set up in person assessment time.    Will continue to follow and assist as needed  Jorge Ny, [facility] Desk#: [phone] Cell#: [phone]

## 2020-07-12 NOTE — Progress Notes (Signed)
Pt has been discharged form the unit via wheel chair. Pt denies any pain and AVS documentation have been given and reviewed.

## 2020-07-12 NOTE — Plan of Care (Signed)
A/Ox4, VS stable. AVS papers and education was provided. All questions are answered. Family and belongings at the bedside. IV removed and intact. Transferred off the floor. Family states will drive this patient home.

## 2020-07-13 MED FILL — Medication: Qty: 1 | Status: AC

## 2020-07-14 ENCOUNTER — Telehealth: Payer: Self-pay | Admitting: Nurse Practitioner

## 2020-07-14 DIAGNOSIS — F10939 Alcohol use, unspecified with withdrawal, unspecified: Secondary | ICD-10-CM

## 2020-07-14 DIAGNOSIS — R569 Unspecified convulsions: Secondary | ICD-10-CM

## 2020-07-14 DIAGNOSIS — F10221 Alcohol dependence with intoxication delirium: Secondary | ICD-10-CM

## 2020-07-14 DIAGNOSIS — F10239 Alcohol dependence with withdrawal, unspecified: Secondary | ICD-10-CM

## 2020-07-14 MED ORDER — CHLORDIAZEPOXIDE HCL 5 MG PO CAPS: 5.0000 mg | ORAL_CAPSULE | Freq: Three times a day (TID) | ORAL | 0 refills | Status: DC | PRN

## 2020-07-14 NOTE — Telephone Encounter (Signed)
[place] paramedic called and said pt was discharged from the hospital and that Dr Cruzita Lederer wrote a script for Librium but that the pharmacy said they didn't receive the script so he was asking if the pts PCP could send in. Please advise. Callback for [employer] [phone]

## 2020-07-14 NOTE — Telephone Encounter (Signed)
rx sent to his pharmacy He should maintain upcoming appt for hospital f/up 07/17/2020

## 2020-07-17 ENCOUNTER — Inpatient Hospital Stay: Payer: BC Managed Care – PPO | Admitting: Nurse Practitioner

## 2020-07-19 ENCOUNTER — Telehealth (HOSPITAL_COMMUNITY): Payer: Self-pay

## 2020-07-19 NOTE — Telephone Encounter (Signed)
Spoke to [place] who agreed with a 1130 visit tomorrow morning. Call complete.

## 2020-07-20 ENCOUNTER — Other Ambulatory Visit (HOSPITAL_COMMUNITY): Payer: Self-pay

## 2020-07-20 NOTE — Progress Notes (Signed)
Paramedicine Encounter    Patient ID: [name], male    DOB: 1980/08/13, 40 y.o.   MRN: [id number]   Patient Care Team: Nche, Charlene Brooke, NP as PCP - General (Internal Medicine) Skeet Latch, MD as PCP - Cardiology (Cardiology)  Patient Active Problem List   Diagnosis Date Noted  . Seizure ([place]) 07/10/2020  . Mood disorder ([place]) 05/16/2020  . Hypotension 03/24/2020  . Polysubstance abuse ([place]) 03/24/2020  . Nausea & vomiting 03/06/2020  . AKI (acute kidney injury) ([place]) 10/30/2019  . Gastritis 10/30/2019  . Elevated INR 10/30/2019  . 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 Acute heart failure ([place]) 10/05/2019  . Pulmonary embolism ([place]) 10/04/2019  . Acute pulmonary embolism ([place]) 10/04/2019  . Chronic systolic CHF (congestive heart failure) ([place])   . Inappropriate sinus tachycardia   . Chest pain 11/03/2013  . Tobacco abuse 11/03/2013    Current Outpatient Medications:  .  carvedilol (COREG) 3.125 MG tablet, Take 1 tablet (3.125 mg total) by mouth 2 (two) times daily., Disp: 60 tablet, Rfl: 11 .  chlordiazePOXIDE (LIBRIUM) 5 MG capsule, Take 1 capsule (5 mg total) by mouth 3 (three) times daily as needed for anxiety., Disp: 12 capsule, Rfl: 0 .  dapagliflozin propanediol (FARXIGA) 10 MG TABS tablet, Take 1 tablet (10 mg total) by mouth daily before breakfast., Disp: 30 tablet, Rfl: 6 .  digoxin (LANOXIN) 0.125 MG tablet, Take 1 tablet (0.125 mg total) by mouth daily., Disp: 30 tablet, Rfl: 5 .  folic acid (FOLVITE) 1 MG tablet, Take 1 tablet (1 mg total) by mouth daily., Disp: 30 tablet, Rfl: 3 .  ivabradine (CORLANOR) 7.5 MG TABS tablet, Take 1 tablet (7.5 mg total) by mouth 2 (two) times daily with a meal., Disp: 60 tablet, Rfl: 3 .  ondansetron (ZOFRAN ODT) 4 MG disintegrating tablet, Take 1 tablet (4 mg total) by mouth every 8 (eight) hours as needed for nausea or vomiting., Disp: 20 tablet, Rfl: 0 .  pantoprazole (PROTONIX) 40 MG  tablet, Take 1 tablet (40 mg total) by mouth daily., Disp: 30 tablet, Rfl: 1 .  QUEtiapine (SEROQUEL) 100 MG tablet, Take 1.5 tablets (150 mg total) by mouth daily. (Patient taking differently: Take 150 mg by mouth at bedtime as needed (sleep).), Disp: 180 tablet, Rfl: 3 .  rivaroxaban (XARELTO) 20 MG TABS tablet, Take 20 mg by mouth every evening., Disp: , Rfl:  .  sacubitril-valsartan (ENTRESTO) 97-103 MG, Take 1 tablet by mouth 2 (two) times daily., Disp: 60 tablet, Rfl: 11 .  sertraline (ZOLOFT) 25 MG tablet, Take 1 tablet (25 mg total) by mouth daily., Disp: 90 tablet, Rfl: 3 .  spironolactone (ALDACTONE) 25 MG tablet, Take 1 tablet (25 mg total) by mouth daily. HOLD until 03/26/20, Disp: 90 tablet, Rfl: 3 .  sucralfate (CARAFATE) 1 g tablet, Take 1 tablet (1 g total) by mouth 4 (four) times daily -  with meals and at bedtime., Disp: 120 tablet, Rfl: 0 .  thiamine 100 MG tablet, Take 1 tablet (100 mg total) by mouth daily., Disp: 30 tablet, Rfl: 0 Allergies  Allergen Reactions  . Bactrim [Sulfamethoxazole-Trimethoprim] Rash     Social History   Socioeconomic History  . Marital status: Single    Spouse name: Not on file  . Number of children: Not on file  . Years of education: Not on file  . Highest education level: Not on file  Occupational History  . Occupation: [employer]  Tobacco Use  . Smoking status: Current Every Day Smoker    Packs/day: 0.25    Years: 24.00    Pack years: 6.00    Types: Cigarettes  . Smokeless tobacco: Never Used  Vaping Use  . Vaping Use: Former  Substance and Sexual Activity  . Alcohol use: Yes    Alcohol/week: 8.0 standard drinks    Types: 8 Shots of liquor per week    Comment: heavy use since age 63  . Drug use: Yes    Frequency: 1.0 times per week    Types: Marijuana  . Sexual activity: Yes  Other Topics Concern  . Not on file  Social History Narrative  . Not on file   Social Determinants of Health   Financial Resource  Strain: High Risk  . Difficulty of Paying Living Expenses: Very hard  Food Insecurity: Food Insecurity Present  . Worried About Charity fundraiser in the Last Year: Sometimes true  . Ran Out of Food in the Last Year: Sometimes true  Transportation Needs: Not on file  Physical Activity: Not on file  Stress: Not on file  Social Connections: Not on file  Intimate Partner Violence: Not on file    Physical Exam Vitals reviewed.  Constitutional:      Appearance: He is normal weight.  HENT:     Head: Normocephalic.     Nose: Nose normal.     Mouth/Throat:     Mouth: Mucous membranes are moist.     Pharynx: Oropharynx is clear.  Eyes:     Conjunctiva/sclera: Conjunctivae normal.     Pupils: Pupils are equal, round, and reactive to light.  Cardiovascular:     Rate and Rhythm: Normal rate and regular rhythm.     Pulses: Normal pulses.     Heart sounds: Normal heart sounds.  Abdominal:     General: Abdomen is flat.     Palpations: Abdomen is soft.  Musculoskeletal:        General: Normal range of motion.     Cervical back: Normal range of motion.     Right lower leg: No edema.     Left lower leg: No edema.  Skin:    General: Skin is warm and dry.     Capillary Refill: Capillary refill takes less than 2 seconds.  Neurological:     General: No focal deficit present.     Mental Status: He is alert. Mental status is at baseline.  Psychiatric:        Mood and Affect: Mood normal.    Arrived for home visit for Joseluis who was alert and oriented reporting he is feeling better than he was when he was in the hospital. Judas says he has been having some head and back pain from where he fell during his syncopal/seizure episode in the clinic. Dillard reports he has been taking his medications out of the bottles. I obtained vitals and assessment as noted. No swelling, no JVD, lung sounds clear. I reviewed medications and filled two pill boxes. Some medications missing and some will run short  as listed below:   NO ZOLOFT #1 AND #2 AM NO ENTRESTO #1 AND #2 AM, EVE  #1 BOX- Xarelto- Monday, Tuesday, Weds EVE  #2 BOX- Xarelto- Every EVE  Spironolactone- Friday, Saturday AM     Future Appointments  Date Time Provider [place]  07/31/2020  9:30 AM [facility] PA/NP [facility] None     ACTION: Home visit completed Next visit planned  for two weeks in clinic on 3/21 at 0930

## 2020-07-21 ENCOUNTER — Encounter (HOSPITAL_COMMUNITY): Payer: Self-pay | Admitting: *Deleted

## 2020-07-21 NOTE — Progress Notes (Signed)
Disability forms completed, signed by Dr Haroldine Laws, and faxed to [facility] at [phone]

## 2020-07-28 ENCOUNTER — Other Ambulatory Visit: Payer: Self-pay

## 2020-07-28 ENCOUNTER — Inpatient Hospital Stay (HOSPITAL_COMMUNITY)
Admission: EM | Admit: 2020-07-28 | Discharge: 2020-07-31 | DRG: 682 | Disposition: A | Discharge status: Home / Self Care | Payer: BC Managed Care – PPO | Attending: Internal Medicine | Admitting: Internal Medicine

## 2020-07-28 ENCOUNTER — Emergency Department (HOSPITAL_COMMUNITY): Payer: BC Managed Care – PPO

## 2020-07-28 ENCOUNTER — Encounter (HOSPITAL_COMMUNITY): Payer: Self-pay

## 2020-07-28 DIAGNOSIS — Z7901 Long term (current) use of anticoagulants: Secondary | ICD-10-CM

## 2020-07-28 DIAGNOSIS — I110 Hypertensive heart disease with heart failure: Secondary | ICD-10-CM | POA: Diagnosis present

## 2020-07-28 DIAGNOSIS — Z20822 Contact with and (suspected) exposure to covid-19: Secondary | ICD-10-CM | POA: Diagnosis present

## 2020-07-28 DIAGNOSIS — K760 Fatty (change of) liver, not elsewhere classified: Secondary | ICD-10-CM | POA: Diagnosis present

## 2020-07-28 DIAGNOSIS — Z7984 Long term (current) use of oral hypoglycemic drugs: Secondary | ICD-10-CM

## 2020-07-28 DIAGNOSIS — E119 Type 2 diabetes mellitus without complications: Secondary | ICD-10-CM | POA: Diagnosis present

## 2020-07-28 DIAGNOSIS — K226 Gastro-esophageal laceration-hemorrhage syndrome: Secondary | ICD-10-CM | POA: Diagnosis present

## 2020-07-28 DIAGNOSIS — K921 Melena: Secondary | ICD-10-CM

## 2020-07-28 DIAGNOSIS — R112 Nausea with vomiting, unspecified: Secondary | ICD-10-CM

## 2020-07-28 DIAGNOSIS — F17210 Nicotine dependence, cigarettes, uncomplicated: Secondary | ICD-10-CM | POA: Diagnosis present

## 2020-07-28 DIAGNOSIS — I5082 Biventricular heart failure: Secondary | ICD-10-CM | POA: Diagnosis present

## 2020-07-28 DIAGNOSIS — Z841 Family history of disorders of kidney and ureter: Secondary | ICD-10-CM

## 2020-07-28 DIAGNOSIS — N179 Acute kidney failure, unspecified: Secondary | ICD-10-CM | POA: Diagnosis not present

## 2020-07-28 DIAGNOSIS — Z833 Family history of diabetes mellitus: Secondary | ICD-10-CM

## 2020-07-28 DIAGNOSIS — F10239 Alcohol dependence with withdrawal, unspecified: Secondary | ICD-10-CM | POA: Diagnosis not present

## 2020-07-28 DIAGNOSIS — K2950 Unspecified chronic gastritis without bleeding: Secondary | ICD-10-CM

## 2020-07-28 DIAGNOSIS — Z882 Allergy status to sulfonamides status: Secondary | ICD-10-CM

## 2020-07-28 DIAGNOSIS — E860 Dehydration: Secondary | ICD-10-CM | POA: Diagnosis present

## 2020-07-28 DIAGNOSIS — D62 Acute posthemorrhagic anemia: Secondary | ICD-10-CM | POA: Diagnosis present

## 2020-07-28 DIAGNOSIS — Z86711 Personal history of pulmonary embolism: Secondary | ICD-10-CM

## 2020-07-28 DIAGNOSIS — K2920 Alcoholic gastritis without bleeding: Secondary | ICD-10-CM | POA: Diagnosis present

## 2020-07-28 DIAGNOSIS — Z8249 Family history of ischemic heart disease and other diseases of the circulatory system: Secondary | ICD-10-CM

## 2020-07-28 DIAGNOSIS — J984 Other disorders of lung: Secondary | ICD-10-CM | POA: Diagnosis present

## 2020-07-28 DIAGNOSIS — J45909 Unspecified asthma, uncomplicated: Secondary | ICD-10-CM | POA: Diagnosis present

## 2020-07-28 DIAGNOSIS — I5022 Chronic systolic (congestive) heart failure: Secondary | ICD-10-CM | POA: Diagnosis present

## 2020-07-28 DIAGNOSIS — Z79899 Other long term (current) drug therapy: Secondary | ICD-10-CM

## 2020-07-28 DIAGNOSIS — Y902 Blood alcohol level of 40-59 mg/100 ml: Secondary | ICD-10-CM | POA: Diagnosis present

## 2020-07-28 DIAGNOSIS — I428 Other cardiomyopathies: Secondary | ICD-10-CM | POA: Diagnosis present

## 2020-07-28 LAB — CBC
HCT: 43.9 % (ref 39.0–52.0)
Hemoglobin: 14.8 g/dL (ref 13.0–17.0)
MCH: 30.6 pg (ref 26.0–34.0)
MCHC: 33.7 g/dL (ref 30.0–36.0)
MCV: 90.7 fL (ref 80.0–100.0)
Platelets: 388 10*3/uL (ref 150–400)
RBC: 4.84 MIL/uL (ref 4.22–5.81)
RDW: 14.8 % (ref 11.5–15.5)
WBC: 8.1 10*3/uL (ref 4.0–10.5)
nRBC: 0 % (ref 0.0–0.2)

## 2020-07-28 LAB — COMPREHENSIVE METABOLIC PANEL
ALT: 77 U/L — ABNORMAL HIGH (ref 0–44)
AST: 60 U/L — ABNORMAL HIGH (ref 15–41)
Albumin: 5.6 g/dL — ABNORMAL HIGH (ref 3.5–5.0)
Alkaline Phosphatase: 90 U/L (ref 38–126)
Anion gap: 29 — ABNORMAL HIGH (ref 5–15)
BUN: 21 mg/dL — ABNORMAL HIGH (ref 6–20)
CO2: 18 mmol/L — ABNORMAL LOW (ref 22–32)
Calcium: 10.2 mg/dL (ref 8.9–10.3)
Chloride: 91 mmol/L — ABNORMAL LOW (ref 98–111)
Creatinine, Ser: 2.25 mg/dL — ABNORMAL HIGH (ref 0.61–1.24)
GFR, Estimated: 37 mL/min — ABNORMAL LOW (ref >60)
Glucose, Bld: 155 mg/dL — ABNORMAL HIGH (ref 70–99)
Potassium: 4.3 mmol/L (ref 3.5–5.1)
Sodium: 138 mmol/L (ref 135–145)
Total Bilirubin: 1.4 mg/dL — ABNORMAL HIGH (ref 0.3–1.2)
Total Protein: 10.1 g/dL — ABNORMAL HIGH (ref 6.5–8.1)

## 2020-07-28 LAB — TROPONIN I (HIGH SENSITIVITY): Troponin I (High Sensitivity): 15 ng/L (ref <18)

## 2020-07-28 LAB — POC OCCULT BLOOD, ED: Fecal Occult Bld: POSITIVE — AB

## 2020-07-28 LAB — ETHANOL: Alcohol, Ethyl (B): 48 mg/dL — ABNORMAL HIGH (ref <10)

## 2020-07-28 LAB — LIPASE, BLOOD: Lipase: 26 U/L (ref 11–51)

## 2020-07-28 MED ORDER — THIAMINE HCL 100 MG/ML IJ SOLN: 100.0000 mg | Freq: Every day | INTRAMUSCULAR | Status: DC

## 2020-07-28 MED ORDER — LORAZEPAM 1 MG PO TABS
0.0000 mg | ORAL_TABLET | Freq: Four times a day (QID) | ORAL | Status: AC
  Administered 2020-07-28 – 2020-07-29 (×2): 2 mg via ORAL
  Filled 2020-07-28 (×2): qty 2

## 2020-07-28 MED ORDER — LORAZEPAM 2 MG/ML IJ SOLN: 0.0000 mg | Freq: Two times a day (BID) | INTRAMUSCULAR | Status: DC

## 2020-07-28 MED ORDER — THIAMINE HCL 100 MG PO TABS
100.0000 mg | ORAL_TABLET | Freq: Every day | ORAL | Status: DC
  Administered 2020-07-29 – 2020-07-30 (×2): 100 mg via ORAL
  Filled 2020-07-28 (×2): qty 1

## 2020-07-28 MED ORDER — MORPHINE SULFATE (PF) 2 MG/ML IV SOLN
2.0000 mg | Freq: Once | INTRAVENOUS | Status: AC
  Administered 2020-07-28: 2 mg via INTRAVENOUS
  Filled 2020-07-28: qty 1

## 2020-07-28 MED ORDER — LORAZEPAM 2 MG/ML IJ SOLN
0.0000 mg | Freq: Four times a day (QID) | INTRAMUSCULAR | Status: AC
  Administered 2020-07-29: 1 mg via INTRAVENOUS
  Filled 2020-07-28: qty 1

## 2020-07-28 MED ORDER — SODIUM CHLORIDE 0.9 % IV BOLUS
500.0000 mL | Freq: Once | INTRAVENOUS | Status: AC
  Administered 2020-07-28: 500 mL via INTRAVENOUS

## 2020-07-28 MED ORDER — LORAZEPAM 1 MG PO TABS
1.0000 mg | ORAL_TABLET | Freq: Once | ORAL | Status: AC
  Administered 2020-07-28: 1 mg via ORAL
  Filled 2020-07-28: qty 1

## 2020-07-28 MED ORDER — LORAZEPAM 1 MG PO TABS: 0.0000 mg | ORAL_TABLET | Freq: Two times a day (BID) | ORAL | Status: DC

## 2020-07-28 NOTE — ED Provider Notes (Incomplete)
[facility] DEPT Provider Note   CSN: [id number] Arrival date & time: 07/28/20  2109     History Chief Complaint  Patient presents with  . Chest Pain  . Abdominal Pain    Robert [name] is a 40 y.o. male CHF, asthma, polysubstance use disorder, alcohol use.  Patient presents today with chief complaint of back, chest, flank, and abdominal pain.  Patient reports that his symptoms started earlier this morning.  It has been constant since then.  Patient rates his pain 8/10 on the pain scale.  Patient denies any alleviating or aggravating factors.  Also endorses nausea and vomiting.  Patient reports he has vomited 15 times in the last 24 hours.  Patient states emesis has been "many colors."  Patient denies any coffee-ground emesis.  Endorses that his vomiting has been violent retching.  Patient also endorses generalized tremors that his entire body starting earlier this morning, melena, denies any blood in stool.  Patient endorses cough, shortness of breath, constipation.  Patient denies any fever, chills, lightheadedness, dizziness, headache, seizures, dysuria, urinary frequency, hematuria.    Patient reports that he sometimes misses taking his medications however denies missing any in the last few days.  Patient's last normal bowel movement was yesterday.  Patient reports he has not had anything to drink in the last 2 to 3 days.  Patient denies daily alcohol drinking.  Patient denies any drug use.  HPI     Past Medical History:  Diagnosis Date  . Alcohol use   . Alcohol withdrawal ([place]) 03/06/2020  . Asthma   . CHF (congestive heart failure) [facility])     Patient Active Problem List   Diagnosis Date Noted  . Seizure ([place]) 07/10/2020  . Mood disorder ([place]) 05/16/2020  . Hypotension 03/24/2020  . Polysubstance abuse ([place]) 03/24/2020  . Nausea & vomiting 03/06/2020  . AKI (acute kidney injury) ([place]) 10/30/2019  . Gastritis 10/30/2019  . Elevated INR  10/30/2019  . 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[place])   . Acute heart failure ([place]) 10/05/2019  . Pulmonary embolism ([place]) 10/04/2019  . Acute pulmonary embolism ([place]) 10/04/2019  . Chronic systolic CHF (congestive heart failure) ([place])   . Inappropriate sinus tachycardia   . Chest pain 11/03/2013  . Tobacco abuse 11/03/2013    Past Surgical History:  Procedure Laterality Date  . ESOPHAGOGASTRODUODENOSCOPY (EGD) WITH PROPOFOL N/A 11/01/2019   Procedure: ESOPHAGOGASTRODUODENOSCOPY (EGD) WITH PROPOFOL;  Surgeon: [name], MD;  Location: [facility] ENDOSCOPY;  Service: Endoscopy;  Laterality: N/A;  . RIGHT/LEFT HEART CATH AND CORONARY ANGIOGRAPHY N/A 10/06/2019   Procedure: RIGHT/LEFT HEART CATH AND CORONARY ANGIOGRAPHY;  Surgeon: [name], MD;  Location: [facility] CV LAB;  Service: Cardiovascular;  Laterality: N/A;  . WRIST SURGERY         Family History  Problem Relation Age of Onset  . Heart failure Father   . Diabetes Father   . Kidney disease Father   . Heart disease Father 11  . Heart disease Maternal Grandfather 86  . Heart disease Paternal Grandfather 60    Social History   Tobacco Use  . Smoking status: Current Every Day Smoker    Packs/day: 0.25    Years: 24.00    Pack years: 6.00    Types: Cigarettes  . Smokeless tobacco: Never Used  Vaping Use  . Vaping Use: Former  Substance Use Topics  . Alcohol use: Yes    Alcohol/week: 8.0 standard drinks  Types: 8 Shots of liquor per week    Comment: heavy use since age 64  . Drug use: Yes    Frequency: 1.0 times per week    Types: Marijuana    Home Medications Prior to Admission medications   Medication Sig Start Date End Date Taking? Authorizing Provider  carvedilol (COREG) 3.125 MG tablet Take 1 tablet (3.125 mg total) by mouth 2 (two) times daily. 04/11/20 04/11/21 Yes Clegg, Amy D, NP  dapagliflozin propanediol (FARXIGA) 10 MG TABS tablet Take 1 tablet (10 mg total) by mouth  daily before breakfast. Patient taking differently: Take 10 mg by mouth daily. 12/09/19  Yes Bensimhon, Shaune Pascal, MD  digoxin (LANOXIN) 0.125 MG tablet Take 1 tablet (0.125 mg total) by mouth daily. 06/14/20  Yes Lyda Jester M, PA-C  folic acid (FOLVITE) 1 MG tablet Take 1 tablet (1 mg total) by mouth daily. 03/16/20  Yes Bensimhon, Shaune Pascal, MD  ivabradine (CORLANOR) 7.5 MG TABS tablet Take 7.5 mg by mouth 2 (two) times daily with a meal.   Yes [provider]  ondansetron (ZOFRAN ODT) 4 MG disintegrating tablet Take 1 tablet (4 mg total) by mouth every 8 (eight) hours as needed for nausea or vomiting. 03/26/20  Yes Maudie Flakes, MD  pantoprazole (PROTONIX) 40 MG tablet Take 1 tablet (40 mg total) by mouth daily. 06/23/20  Yes Nche, Charlene Brooke, NP  potassium chloride (KLOR-CON) 20 MEQ packet Take 20 mEq by mouth daily.   Yes [provider]  QUEtiapine (SEROQUEL) 100 MG tablet Take 1.5 tablets (150 mg total) by mouth daily. 06/23/20  Yes Nche, Charlene Brooke, NP  sucralfate (CARAFATE) 1 g tablet Take 1 tablet (1 g total) by mouth 4 (four) times daily -  with meals and at bedtime. 06/23/20  Yes Nche, Charlene Brooke, NP  traZODone (DESYREL) 50 MG tablet Take 50 mg by mouth at bedtime.   Yes [provider]  chlordiazePOXIDE (LIBRIUM) 5 MG capsule Take 1 capsule (5 mg total) by mouth 3 (three) times daily as needed for anxiety. 07/14/20   Nche, Charlene Brooke, NP  ivabradine (CORLANOR) 7.5 MG TABS tablet Take 1 tablet (7.5 mg total) by mouth 2 (two) times daily with a meal. 03/27/20   Bensimhon, Shaune Pascal, MD  rivaroxaban (XARELTO) 20 MG TABS tablet Take 20 mg by mouth every evening.    [provider]  sacubitril-valsartan (ENTRESTO) 97-103 MG Take 1 tablet by mouth 2 (two) times daily. 06/16/20   Lyda Jester M, PA-C  sertraline (ZOLOFT) 25 MG tablet Take 1 tablet (25 mg total) by mouth daily. 06/23/20 06/23/21  Nche, Charlene Brooke, NP  spironolactone (ALDACTONE)  25 MG tablet Take 1 tablet (25 mg total) by mouth daily. HOLD until 03/26/20 03/24/20   Oretha Milch D, MD  thiamine 100 MG tablet Take 1 tablet (100 mg total) by mouth daily. 11/03/19   Thurnell Lose, MD    Allergies    Bactrim [sulfamethoxazole-trimethoprim]  Review of Systems   Review of Systems  Constitutional: Negative for chills and fever.  Eyes: Negative for visual disturbance.  Respiratory: Positive for cough and shortness of breath.   Cardiovascular: Positive for chest pain. Negative for palpitations and leg swelling.  Gastrointestinal: Positive for abdominal pain, constipation, nausea and vomiting. Negative for abdominal distention, anal bleeding, blood in stool, diarrhea and rectal pain.  Genitourinary: Negative for difficulty urinating, dysuria, frequency and hematuria.  Musculoskeletal: Positive for back pain and neck pain.  Skin: Negative for color  change and rash.  Neurological: Positive for tremors. Negative for dizziness, seizures, syncope, facial asymmetry, speech difficulty, weakness, light-headedness, numbness and headaches.  Psychiatric/Behavioral: Negative for confusion.    Physical Exam Updated Vital Signs BP 117/84   Pulse (!) 127   Temp 98.2 F (36.8 C)   Resp (!) 33   Ht 5\' 10"  (1.778 m)   Wt 86.2 kg   SpO2 99%   BMI 27.26 kg/m   Physical Exam Vitals and nursing note reviewed. Exam conducted with a chaperone present (Male nurse present as chaperone).  Constitutional:      General: He is not in acute distress.    Appearance: He is ill-appearing. He is not toxic-appearing or diaphoretic.  HENT:     Head: Normocephalic.  Eyes:     General: No scleral icterus.       Right eye: No discharge.        Left eye: No discharge.  Cardiovascular:     Rate and Rhythm: Tachycardia present.     Heart sounds: Normal heart sounds.     Comments: tachycardic at rate of 127. Pulmonary:     Effort: Pulmonary effort is normal. Tachypnea present. No  bradypnea or respiratory distress.     Breath sounds: Normal breath sounds. No stridor. No decreased breath sounds, wheezing, rhonchi or rales.     Comments: Able to speak in full complete sentences without difficulty Abdominal:     General: Bowel sounds are normal. There is no distension. There are no signs of injury.     Palpations: Abdomen is soft. There is no mass or pulsatile mass.     Tenderness: There is generalized abdominal tenderness. There is no guarding or rebound.     Hernia: There is no hernia in the umbilical area or ventral area.  Genitourinary:    Rectum: Guaiac result positive. No mass, tenderness, anal fissure, external hemorrhoid or internal hemorrhoid. Normal anal tone.     Comments: No blood or melena noted on gloved hand after rectal exam Musculoskeletal:     Cervical back: Neck supple.     Right lower leg: No swelling, deformity, lacerations, tenderness or bony tenderness. No edema.     Left lower leg: No swelling, deformity, lacerations, tenderness or bony tenderness. No edema.  Skin:    General: Skin is warm and dry.  Neurological:     General: No focal deficit present.     Mental Status: He is alert.     Motor: Tremor present.  Psychiatric:        Behavior: Behavior is cooperative.     ED Results / Procedures / Treatments   Labs (all labs ordered are listed, but only abnormal results are displayed) Labs Reviewed  COMPREHENSIVE METABOLIC PANEL - Abnormal; Notable for the following components:      Result Value   Chloride 91 (*)    CO2 18 (*)    Glucose, Bld 155 (*)    BUN 21 (*)    Creatinine, Ser 2.25 (*)    Total Protein 10.1 (*)    Albumin 5.6 (*)    AST 60 (*)    ALT 77 (*)    Total Bilirubin 1.4 (*)    GFR, Estimated 37 (*)    Anion gap 29 (*)    All other components within normal limits  ETHANOL - Abnormal; Notable for the following components:   Alcohol, Ethyl (B) 48 (*)    All other components within normal limits  POC OCCULT BLOOD,  ED  - Abnormal; Notable for the following components:   Fecal Occult Bld POSITIVE (*)    All other components within normal limits  CBC  LIPASE, BLOOD  URINALYSIS, ROUTINE W REFLEX MICROSCOPIC  BRAIN NATRIURETIC PEPTIDE  RAPID URINE DRUG SCREEN, HOSP PERFORMED  CK  TROPONIN I (HIGH SENSITIVITY)  TROPONIN I (HIGH SENSITIVITY)    EKG None  Radiology DG Chest Port 1 View  Result Date: 07/28/2020 CLINICAL DATA:  Chest pain EXAM: PORTABLE CHEST 1 VIEW COMPARISON:  07/10/2020 FINDINGS: Heart and mediastinal contours are within normal limits. No focal opacities or effusions. No acute bony abnormality. IMPRESSION: No active disease. Electronically Signed   By: Rolm Baptise M.D.   On: 07/28/2020 21:59    Procedures Procedures   Medications Ordered in ED Medications  LORazepam (ATIVAN) tablet 1 mg (1 mg Oral Given 07/28/20 2222)    ED Course  I have reviewed the triage vital signs and the nursing notes.  Pertinent labs & imaging results that were available during my care of the patient were reviewed by me and considered in my medical decision making (see chart for details).    MDM Rules/Calculators/A&P                          Alert ill-appearing 40 year old male, no acute distress, nontoxic-appearing.  Patient presents with complaint of chest pain, abdominal pain, back pain, nausea, and vomiting.  Patient reports all symptoms began earlier this morning.   On physical exam normoactive bowel sounds, abdomen soft, nondistended, generalized tenderness throughout abdomen.  Lungs clear to auscultation bilaterally.  No swelling or edema to lower extremities.  No blood or melena noted on gloved hand after rectal exam.  He is noted to be tachycardic at rate of 127.  CBC is unremarkable.       Final Clinical Impression(s) / ED Diagnoses Final diagnoses:  None    Rx / DC Orders ED Discharge Orders    None

## 2020-07-28 NOTE — ED Provider Notes (Addendum)
[place] DEPT Provider Note   CSN: [id number] Arrival date & time: 07/28/20  2109     History Chief Complaint  Patient presents with  . Chest Pain  . Abdominal Pain    Robert [name] is a 40 y.o. male CHF, asthma, polysubstance use disorder, alcohol use.  Patient presents today with chief complaint of back, chest, flank, and abdominal pain.  Patient reports that his symptoms started earlier this morning.  It has been constant since then.  Patient rates his pain 8/10 on the pain scale.  Patient denies any alleviating or aggravating factors.  Also endorses nausea and vomiting.  Patient reports he has vomited 15 times in the last 24 hours.  Patient states emesis has been "many colors."  Patient denies any coffee-ground emesis.  Endorses that his vomiting has been violent retching.  Patient also endorses generalized tremors that his entire body starting earlier this morning, melena, denies any blood in stool.  Patient endorses cough, shortness of breath, constipation.  Patient denies any fever, chills, lightheadedness, dizziness, headache, seizures, dysuria, urinary frequency, hematuria.    Patient reports that he sometimes misses taking his medications however denies missing any in the last few days.  Patient's last normal bowel movement was yesterday.  Patient reports he has not had anything to drink in the last 2 to 3 days.  Patient denies daily alcohol drinking.  Patient denies any drug use.  HPI     Past Medical History:  Diagnosis Date  . Alcohol use   . Alcohol withdrawal ([place]) 03/06/2020  . Asthma   . CHF (congestive heart failure) [facility])     Patient Active Problem List   Diagnosis Date Noted  . Seizure ([place]) 07/10/2020  . Mood disorder ([place]) 05/16/2020  . Hypotension 03/24/2020  . Polysubstance abuse ([place]) 03/24/2020  . Nausea & vomiting 03/06/2020  . AKI (acute kidney injury) ([place]) 10/30/2019  . Gastritis 10/30/2019  . Elevated INR  10/30/2019  . 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[place])   . Acute heart failure ([place]) 10/05/2019  . Pulmonary embolism ([place]) 10/04/2019  . Acute pulmonary embolism ([place]) 10/04/2019  . Chronic systolic CHF (congestive heart failure) ([place])   . Inappropriate sinus tachycardia   . Chest pain 11/03/2013  . Tobacco abuse 11/03/2013    Past Surgical History:  Procedure Laterality Date  . ESOPHAGOGASTRODUODENOSCOPY (EGD) WITH PROPOFOL N/A 11/01/2019   Procedure: ESOPHAGOGASTRODUODENOSCOPY (EGD) WITH PROPOFOL;  Surgeon: [name], MD;  Location: [facility] ENDOSCOPY;  Service: Endoscopy;  Laterality: N/A;  . RIGHT/LEFT HEART CATH AND CORONARY ANGIOGRAPHY N/A 10/06/2019   Procedure: RIGHT/LEFT HEART CATH AND CORONARY ANGIOGRAPHY;  Surgeon: [name], MD;  Location: [facility];  Service: Cardiovascular;  Laterality: N/A;  . WRIST SURGERY         Family History  Problem Relation Age of Onset  . Heart failure Father   . Diabetes Father   . Kidney disease Father   . Heart disease Father 51  . Heart disease Maternal Grandfather 16  . Heart disease Paternal Grandfather 34    Social History   Tobacco Use  . Smoking status: Current Every Day Smoker    Packs/day: 0.25    Years: 24.00    Pack years: 6.00    Types: Cigarettes  . Smokeless tobacco: Never Used  Vaping Use  . Vaping Use: Former  Substance Use Topics  . Alcohol use: Yes    Alcohol/week: 8.0 standard drinks  Types: 8 Shots of liquor per week    Comment: heavy use since age 67  . Drug use: Yes    Frequency: 1.0 times per week    Types: Marijuana    Home Medications Prior to Admission medications   Medication Sig Start Date End Date Taking? Authorizing Provider  carvedilol (COREG) 3.125 MG tablet Take 1 tablet (3.125 mg total) by mouth 2 (two) times daily. 04/11/20 04/11/21 Yes Clegg, Amy D, NP  dapagliflozin propanediol (FARXIGA) 10 MG TABS tablet Take 1 tablet (10 mg total) by mouth  daily before breakfast. Patient taking differently: Take 10 mg by mouth daily. 12/09/19  Yes Bensimhon, Shaune Pascal, MD  digoxin (LANOXIN) 0.125 MG tablet Take 1 tablet (0.125 mg total) by mouth daily. 06/14/20  Yes Lyda Jester M, PA-C  folic acid (FOLVITE) 1 MG tablet Take 1 tablet (1 mg total) by mouth daily. 03/16/20  Yes Bensimhon, Shaune Pascal, MD  ivabradine (CORLANOR) 7.5 MG TABS tablet Take 7.5 mg by mouth 2 (two) times daily with a meal.   Yes [provider]  ondansetron (ZOFRAN ODT) 4 MG disintegrating tablet Take 1 tablet (4 mg total) by mouth every 8 (eight) hours as needed for nausea or vomiting. 03/26/20  Yes Maudie Flakes, MD  pantoprazole (PROTONIX) 40 MG tablet Take 1 tablet (40 mg total) by mouth daily. 06/23/20  Yes Nche, Charlene Brooke, NP  potassium chloride (KLOR-CON) 20 MEQ packet Take 20 mEq by mouth daily.   Yes [provider]  QUEtiapine (SEROQUEL) 100 MG tablet Take 1.5 tablets (150 mg total) by mouth daily. 06/23/20  Yes Nche, Charlene Brooke, NP  sucralfate (CARAFATE) 1 g tablet Take 1 tablet (1 g total) by mouth 4 (four) times daily -  with meals and at bedtime. 06/23/20  Yes Nche, Charlene Brooke, NP  traZODone (DESYREL) 50 MG tablet Take 50 mg by mouth at bedtime.   Yes [provider]  chlordiazePOXIDE (LIBRIUM) 5 MG capsule Take 1 capsule (5 mg total) by mouth 3 (three) times daily as needed for anxiety. 07/14/20   Nche, Charlene Brooke, NP  ivabradine (CORLANOR) 7.5 MG TABS tablet Take 1 tablet (7.5 mg total) by mouth 2 (two) times daily with a meal. 03/27/20   Bensimhon, Shaune Pascal, MD  rivaroxaban (XARELTO) 20 MG TABS tablet Take 20 mg by mouth every evening.    [provider]  sacubitril-valsartan (ENTRESTO) 97-103 MG Take 1 tablet by mouth 2 (two) times daily. 06/16/20   Lyda Jester M, PA-C  sertraline (ZOLOFT) 25 MG tablet Take 1 tablet (25 mg total) by mouth daily. 06/23/20 06/23/21  Nche, Charlene Brooke, NP  spironolactone (ALDACTONE)  25 MG tablet Take 1 tablet (25 mg total) by mouth daily. HOLD until 03/26/20 03/24/20   Oretha Milch D, MD  thiamine 100 MG tablet Take 1 tablet (100 mg total) by mouth daily. 11/03/19   Thurnell Lose, MD    Allergies    Bactrim [sulfamethoxazole-trimethoprim]  Review of Systems   Review of Systems  Constitutional: Negative for chills and fever.  Eyes: Negative for visual disturbance.  Respiratory: Positive for cough and shortness of breath.   Cardiovascular: Positive for chest pain. Negative for palpitations and leg swelling.  Gastrointestinal: Positive for abdominal pain, constipation, nausea and vomiting. Negative for abdominal distention, anal bleeding, blood in stool, diarrhea and rectal pain.  Genitourinary: Negative for difficulty urinating, dysuria, frequency and hematuria.  Musculoskeletal: Positive for back pain and neck pain.  Skin: Negative for color  change and rash.  Neurological: Positive for tremors. Negative for dizziness, seizures, syncope, facial asymmetry, speech difficulty, weakness, light-headedness, numbness and headaches.  Psychiatric/Behavioral: Negative for confusion.    Physical Exam Updated Vital Signs BP 117/84   Pulse (!) 127   Temp 98.2 F (36.8 C)   Resp (!) 33   Ht 5\' 10"  (1.778 m)   Wt 86.2 kg   SpO2 99%   BMI 27.26 kg/m   Physical Exam Vitals and nursing note reviewed. Exam conducted with a chaperone present (Male nurse present as chaperone).  Constitutional:      General: He is not in acute distress.    Appearance: He is ill-appearing. He is not toxic-appearing or diaphoretic.  HENT:     Head: Normocephalic.  Eyes:     General: No scleral icterus.       Right eye: No discharge.        Left eye: No discharge.  Cardiovascular:     Rate and Rhythm: Tachycardia present.     Heart sounds: Normal heart sounds.     Comments: tachycardic at rate of 127. Pulmonary:     Effort: Pulmonary effort is normal. Tachypnea present. No  bradypnea or respiratory distress.     Breath sounds: Normal breath sounds. No stridor. No decreased breath sounds, wheezing, rhonchi or rales.     Comments: Able to speak in full complete sentences without difficulty Abdominal:     General: Bowel sounds are normal. There is no distension. There are no signs of injury.     Palpations: Abdomen is soft. There is no mass or pulsatile mass.     Tenderness: There is generalized abdominal tenderness. There is no guarding or rebound.     Hernia: There is no hernia in the umbilical area or ventral area.  Genitourinary:    Rectum: Guaiac result positive. No mass, tenderness, anal fissure, external hemorrhoid or internal hemorrhoid. Normal anal tone.     Comments: No blood or melena noted on gloved hand after rectal exam Musculoskeletal:     Cervical back: Neck supple.     Right lower leg: No swelling, deformity, lacerations, tenderness or bony tenderness. No edema.     Left lower leg: No swelling, deformity, lacerations, tenderness or bony tenderness. No edema.  Skin:    General: Skin is warm and dry.  Neurological:     General: No focal deficit present.     Mental Status: He is alert.     Motor: Tremor present.  Psychiatric:        Behavior: Behavior is cooperative.     ED Results / Procedures / Treatments   Labs (all labs ordered are listed, but only abnormal results are displayed) Labs Reviewed  COMPREHENSIVE METABOLIC PANEL - Abnormal; Notable for the following components:      Result Value   Chloride 91 (*)    CO2 18 (*)    Glucose, Bld 155 (*)    BUN 21 (*)    Creatinine, Ser 2.25 (*)    Total Protein 10.1 (*)    Albumin 5.6 (*)    AST 60 (*)    ALT 77 (*)    Total Bilirubin 1.4 (*)    GFR, Estimated 37 (*)    Anion gap 29 (*)    All other components within normal limits  ETHANOL - Abnormal; Notable for the following components:   Alcohol, Ethyl (B) 48 (*)    All other components within normal limits  POC OCCULT BLOOD,  ED  - Abnormal; Notable for the following components:   Fecal Occult Bld POSITIVE (*)    All other components within normal limits  CBC  LIPASE, BLOOD  URINALYSIS, ROUTINE W REFLEX MICROSCOPIC  BRAIN NATRIURETIC PEPTIDE  RAPID URINE DRUG SCREEN, HOSP PERFORMED  CK  TROPONIN I (HIGH SENSITIVITY)  TROPONIN I (HIGH SENSITIVITY)    EKG None  Radiology DG Chest Port 1 View  Result Date: 07/28/2020 CLINICAL DATA:  Chest pain EXAM: PORTABLE CHEST 1 VIEW COMPARISON:  07/10/2020 FINDINGS: Heart and mediastinal contours are within normal limits. No focal opacities or effusions. No acute bony abnormality. IMPRESSION: No active disease. Electronically Signed   By: Rolm Baptise M.D.   On: 07/28/2020 21:59    Procedures .Critical Care Performed by: Loni Beckwith, PA-C Authorized by: Loni Beckwith, PA-C   Critical care provider statement:    Critical care time (minutes):  45   Critical care was necessary to treat or prevent imminent or life-threatening deterioration of the following conditions:  Renal failure   Critical care was time spent personally by me on the following activities:  Discussions with consultants, evaluation of patient's response to treatment, examination of patient, ordering and performing treatments and interventions, ordering and review of laboratory studies, ordering and review of radiographic studies, pulse oximetry, re-evaluation of patient's condition, obtaining history from patient or surrogate and review of old charts   Care discussed with: admitting provider       Medications Ordered in ED Medications  LORazepam (ATIVAN) tablet 1 mg (1 mg Oral Given 07/28/20 2222)    ED Course  I have reviewed the triage vital signs and the nursing notes.  Pertinent labs & imaging results that were available during my care of the patient were reviewed by me and considered in my medical decision making (see chart for details).    MDM Rules/Calculators/A&P                           Alert ill-appearing 40 year old male, no acute distress, nontoxic-appearing.  Patient presents with complaint of chest pain, abdominal pain, back pain, nausea, and vomiting.  Patient reports all symptoms began earlier this morning.  Patient has history of alcohol use disorder.  Patient reports he has not had anything to drink in 2 to 3 days.    On physical exam normoactive bowel sounds, abdomen soft, nondistended, generalized tenderness throughout abdomen.  Lungs clear to auscultation bilaterally.  No swelling or edema to lower extremities.  No blood or melena noted on gloved hand after rectal exam.  He is noted to be tachycardic at rate of 127.  CBC is unremarkable. Lipase within normal, low suspicion for acute pancreatitis. Troponin 15; Lower suspicion for ACS at this time. Ethanol noted to be elevated at 48.  Will place patient on CIWA protocol.  Chest x-ray shows no acute cardiopulmonary disease.    CMP shows creatinine elevated 2.25, elevated from 2 weeks prior when it was 1.13.  Give patient 500 mL fluid bolus, not limited due to his history of CHF.  Repeat examination patient's lungs remain clear to auscultation.  Will order another 500 mL fluid bolus.    Noted to have anion gap of 49 with decreased bicarb at 18.  Patient's increased anion gap likely secondary to uremia however ethanol also considered.  AST and ALT slightly increased at 60 and 77; respectively.  Due to patient's diffuse abdominal pain will obtain noncontrast CT scan.  CT scan showed no acute abnormality in abdomen or pelvis.  Thin walled air-filled cyst in the left lower lobe posteriorly with small air-fluid level. Is difficult to determine if this represents bronchiectasis or other cause of thin walled cyst. Chest CT may be helpful for further evaluation.   Patient's pulse rate noted to improve after fluid bolus, pain medication and Ativan.  Patient no longer noted to have any tremors on repeat  examination.    Due to patient's AKI and increased anion gap will consult hospitalist team for admission.  Will defer further CT scan to hospitalist at this time.     0030 Spoke to Dr. Sidney Ace who agreed to see the patient for admission.    Final Clinical Impression(s) / ED Diagnoses Final diagnoses:  AKI (acute kidney injury) ([place])  Non-intractable vomiting with nausea, unspecified vomiting type    Rx / DC Orders ED Discharge Orders    None       Loni Beckwith, PA-C 07/29/20 0038    Loni Beckwith, PA-C 07/29/20 0140    Breck Coons, MD 07/30/20 [phone]

## 2020-07-28 NOTE — ED Triage Notes (Signed)
Pt c/o L sided chest pain and upper abd pain starting at noon today. Also reports shaking, n/v. Recent admission for seizures. States last drink was 2-3 days ago.

## 2020-07-29 ENCOUNTER — Other Ambulatory Visit: Payer: Self-pay

## 2020-07-29 DIAGNOSIS — Z8719 Personal history of other diseases of the digestive system: Secondary | ICD-10-CM | POA: Diagnosis not present

## 2020-07-29 DIAGNOSIS — J45909 Unspecified asthma, uncomplicated: Secondary | ICD-10-CM | POA: Diagnosis present

## 2020-07-29 DIAGNOSIS — R112 Nausea with vomiting, unspecified: Secondary | ICD-10-CM

## 2020-07-29 DIAGNOSIS — Z79899 Other long term (current) drug therapy: Secondary | ICD-10-CM | POA: Diagnosis not present

## 2020-07-29 DIAGNOSIS — D62 Acute posthemorrhagic anemia: Secondary | ICD-10-CM | POA: Diagnosis present

## 2020-07-29 DIAGNOSIS — F17210 Nicotine dependence, cigarettes, uncomplicated: Secondary | ICD-10-CM | POA: Diagnosis present

## 2020-07-29 DIAGNOSIS — Z20822 Contact with and (suspected) exposure to covid-19: Secondary | ICD-10-CM | POA: Diagnosis present

## 2020-07-29 DIAGNOSIS — Y902 Blood alcohol level of 40-59 mg/100 ml: Secondary | ICD-10-CM | POA: Diagnosis present

## 2020-07-29 DIAGNOSIS — I428 Other cardiomyopathies: Secondary | ICD-10-CM | POA: Diagnosis present

## 2020-07-29 DIAGNOSIS — I5022 Chronic systolic (congestive) heart failure: Secondary | ICD-10-CM | POA: Diagnosis present

## 2020-07-29 DIAGNOSIS — Z8249 Family history of ischemic heart disease and other diseases of the circulatory system: Secondary | ICD-10-CM | POA: Diagnosis not present

## 2020-07-29 DIAGNOSIS — N179 Acute kidney failure, unspecified: Secondary | ICD-10-CM | POA: Diagnosis present

## 2020-07-29 DIAGNOSIS — Z86711 Personal history of pulmonary embolism: Secondary | ICD-10-CM | POA: Diagnosis not present

## 2020-07-29 DIAGNOSIS — I110 Hypertensive heart disease with heart failure: Secondary | ICD-10-CM | POA: Diagnosis present

## 2020-07-29 DIAGNOSIS — F1020 Alcohol dependence, uncomplicated: Secondary | ICD-10-CM | POA: Diagnosis not present

## 2020-07-29 DIAGNOSIS — Z882 Allergy status to sulfonamides status: Secondary | ICD-10-CM | POA: Diagnosis not present

## 2020-07-29 DIAGNOSIS — E119 Type 2 diabetes mellitus without complications: Secondary | ICD-10-CM | POA: Diagnosis present

## 2020-07-29 DIAGNOSIS — Z833 Family history of diabetes mellitus: Secondary | ICD-10-CM | POA: Diagnosis not present

## 2020-07-29 DIAGNOSIS — Z7984 Long term (current) use of oral hypoglycemic drugs: Secondary | ICD-10-CM | POA: Diagnosis not present

## 2020-07-29 DIAGNOSIS — R7401 Elevation of levels of liver transaminase levels: Secondary | ICD-10-CM | POA: Diagnosis not present

## 2020-07-29 DIAGNOSIS — I2699 Other pulmonary embolism without acute cor pulmonale: Secondary | ICD-10-CM | POA: Diagnosis not present

## 2020-07-29 DIAGNOSIS — E860 Dehydration: Secondary | ICD-10-CM | POA: Diagnosis present

## 2020-07-29 DIAGNOSIS — I5082 Biventricular heart failure: Secondary | ICD-10-CM | POA: Diagnosis present

## 2020-07-29 DIAGNOSIS — K226 Gastro-esophageal laceration-hemorrhage syndrome: Secondary | ICD-10-CM | POA: Diagnosis present

## 2020-07-29 DIAGNOSIS — J984 Other disorders of lung: Secondary | ICD-10-CM | POA: Diagnosis present

## 2020-07-29 DIAGNOSIS — K2920 Alcoholic gastritis without bleeding: Secondary | ICD-10-CM | POA: Diagnosis present

## 2020-07-29 DIAGNOSIS — I10 Essential (primary) hypertension: Secondary | ICD-10-CM | POA: Diagnosis not present

## 2020-07-29 DIAGNOSIS — F10239 Alcohol dependence with withdrawal, unspecified: Secondary | ICD-10-CM | POA: Diagnosis present

## 2020-07-29 DIAGNOSIS — K760 Fatty (change of) liver, not elsewhere classified: Secondary | ICD-10-CM | POA: Diagnosis present

## 2020-07-29 DIAGNOSIS — F10129 Alcohol abuse with intoxication, unspecified: Secondary | ICD-10-CM | POA: Diagnosis not present

## 2020-07-29 DIAGNOSIS — Z720 Tobacco use: Secondary | ICD-10-CM | POA: Diagnosis not present

## 2020-07-29 DIAGNOSIS — I313 Pericardial effusion (noninflammatory): Secondary | ICD-10-CM | POA: Diagnosis not present

## 2020-07-29 DIAGNOSIS — Z841 Family history of disorders of kidney and ureter: Secondary | ICD-10-CM | POA: Diagnosis not present

## 2020-07-29 DIAGNOSIS — Z7901 Long term (current) use of anticoagulants: Secondary | ICD-10-CM | POA: Diagnosis not present

## 2020-07-29 DIAGNOSIS — F1010 Alcohol abuse, uncomplicated: Secondary | ICD-10-CM | POA: Diagnosis not present

## 2020-07-29 LAB — COMPREHENSIVE METABOLIC PANEL
ALT: 65 U/L — ABNORMAL HIGH (ref 0–44)
AST: 44 U/L — ABNORMAL HIGH (ref 15–41)
Albumin: 5.2 g/dL — ABNORMAL HIGH (ref 3.5–5.0)
Alkaline Phosphatase: 80 U/L (ref 38–126)
Anion gap: 24 — ABNORMAL HIGH (ref 5–15)
BUN: 28 mg/dL — ABNORMAL HIGH (ref 6–20)
CO2: 24 mmol/L (ref 22–32)
Calcium: 9.3 mg/dL (ref 8.9–10.3)
Chloride: 91 mmol/L — ABNORMAL LOW (ref 98–111)
Creatinine, Ser: 2.78 mg/dL — ABNORMAL HIGH (ref 0.61–1.24)
GFR, Estimated: 29 mL/min — ABNORMAL LOW (ref >60)
Glucose, Bld: 153 mg/dL — ABNORMAL HIGH (ref 70–99)
Potassium: 4.3 mmol/L (ref 3.5–5.1)
Sodium: 139 mmol/L (ref 135–145)
Total Bilirubin: 1.9 mg/dL — ABNORMAL HIGH (ref 0.3–1.2)
Total Protein: 9.3 g/dL — ABNORMAL HIGH (ref 6.5–8.1)

## 2020-07-29 LAB — URINALYSIS, ROUTINE W REFLEX MICROSCOPIC
Bacteria, UA: NONE SEEN
Bilirubin Urine: NEGATIVE
Glucose, UA: >=500 mg/dL — AB
Ketones, ur: 20 mg/dL — AB
Leukocytes,Ua: NEGATIVE
Nitrite: NEGATIVE
Protein, ur: 100 mg/dL — AB
Specific Gravity, Urine: 1.023 (ref 1.005–1.030)
pH: 5 (ref 5.0–8.0)

## 2020-07-29 LAB — CBC
HCT: 41.1 % (ref 39.0–52.0)
Hemoglobin: 13.4 g/dL (ref 13.0–17.0)
MCH: 30 pg (ref 26.0–34.0)
MCHC: 32.6 g/dL (ref 30.0–36.0)
MCV: 91.9 fL (ref 80.0–100.0)
Platelets: 326 10*3/uL (ref 150–400)
RBC: 4.47 MIL/uL (ref 4.22–5.81)
RDW: 15 % (ref 11.5–15.5)
WBC: 8.7 10*3/uL (ref 4.0–10.5)
nRBC: 0 % (ref 0.0–0.2)

## 2020-07-29 LAB — HEMOGLOBIN AND HEMATOCRIT, BLOOD
HCT: 39 % (ref 39.0–52.0)
HCT: 37 % — ABNORMAL LOW (ref 39.0–52.0)
HCT: 34.5 % — ABNORMAL LOW (ref 39.0–52.0)
HCT: 32.6 % — ABNORMAL LOW (ref 39.0–52.0)
Hemoglobin: 13.2 g/dL (ref 13.0–17.0)
Hemoglobin: 12.2 g/dL — ABNORMAL LOW (ref 13.0–17.0)
Hemoglobin: 11.4 g/dL — ABNORMAL LOW (ref 13.0–17.0)
Hemoglobin: 10.6 g/dL — ABNORMAL LOW (ref 13.0–17.0)

## 2020-07-29 LAB — LACTIC ACID, PLASMA
Lactic Acid, Venous: 3.9 mmol/L (ref 0.5–1.9)
Lactic Acid, Venous: 2 mmol/L (ref 0.5–1.9)
Lactic Acid, Venous: 1.3 mmol/L (ref 0.5–1.9)

## 2020-07-29 LAB — GLUCOSE, CAPILLARY
Glucose-Capillary: 114 mg/dL — ABNORMAL HIGH (ref 70–99)
Glucose-Capillary: 102 mg/dL — ABNORMAL HIGH (ref 70–99)

## 2020-07-29 LAB — TROPONIN I (HIGH SENSITIVITY): Troponin I (High Sensitivity): 14 ng/L (ref <18)

## 2020-07-29 LAB — BRAIN NATRIURETIC PEPTIDE: B Natriuretic Peptide: 72.2 pg/mL (ref 0.0–100.0)

## 2020-07-29 LAB — PROTIME-INR
INR: 1.4 — ABNORMAL HIGH (ref 0.8–1.2)
Prothrombin Time: 16.9 seconds — ABNORMAL HIGH (ref 11.4–15.2)

## 2020-07-29 LAB — RAPID URINE DRUG SCREEN, HOSP PERFORMED
Amphetamines: NOT DETECTED
Barbiturates: NOT DETECTED
Benzodiazepines: NOT DETECTED
Cocaine: NOT DETECTED
Opiates: POSITIVE — AB
Tetrahydrocannabinol: POSITIVE — AB

## 2020-07-29 LAB — DIGOXIN LEVEL: Digoxin Level: 0.7 ng/mL — ABNORMAL LOW (ref 0.8–2.0)

## 2020-07-29 LAB — APTT: aPTT: 33 seconds (ref 24–36)

## 2020-07-29 LAB — SARS CORONAVIRUS 2 (TAT 6-24 HRS): SARS Coronavirus 2: NEGATIVE

## 2020-07-29 LAB — CK: Total CK: 189 U/L (ref 49–397)

## 2020-07-29 MED ORDER — POTASSIUM CHLORIDE 20 MEQ PO PACK
20.0000 meq | PACK | Freq: Every day | ORAL | Status: DC
  Administered 2020-07-29 – 2020-07-30 (×2): 20 meq via ORAL
  Filled 2020-07-29 (×2): qty 1

## 2020-07-29 MED ORDER — TRAZODONE HCL 50 MG PO TABS
50.0000 mg | ORAL_TABLET | Freq: Every day | ORAL | Status: DC
  Administered 2020-07-29 – 2020-07-30 (×3): 50 mg via ORAL
  Filled 2020-07-29 (×4): qty 1

## 2020-07-29 MED ORDER — ONDANSETRON HCL 4 MG PO TABS: 4.0000 mg | ORAL_TABLET | Freq: Four times a day (QID) | ORAL | Status: DC | PRN

## 2020-07-29 MED ORDER — DAPAGLIFLOZIN PROPANEDIOL 10 MG PO TABS
10.0000 mg | ORAL_TABLET | Freq: Every day | ORAL | Status: DC
  Administered 2020-07-29: 10 mg via ORAL
  Filled 2020-07-29: qty 1

## 2020-07-29 MED ORDER — PROSIGHT PO TABS
1.0000 | ORAL_TABLET | Freq: Every day | ORAL | Status: DC
  Administered 2020-07-29 – 2020-07-30 (×2): 1 via ORAL
  Filled 2020-07-29 (×2): qty 1

## 2020-07-29 MED ORDER — ONDANSETRON 4 MG PO TBDP: 4.0000 mg | ORAL_TABLET | Freq: Three times a day (TID) | ORAL | Status: DC | PRN

## 2020-07-29 MED ORDER — MORPHINE SULFATE (PF) 2 MG/ML IV SOLN
2.0000 mg | INTRAVENOUS | Status: DC | PRN
  Administered 2020-07-29 – 2020-07-31 (×2): 2 mg via INTRAVENOUS
  Filled 2020-07-29 (×2): qty 1

## 2020-07-29 MED ORDER — IVABRADINE HCL 5 MG PO TABS
7.5000 mg | ORAL_TABLET | Freq: Two times a day (BID) | ORAL | Status: DC
  Administered 2020-07-29 – 2020-07-31 (×5): 7.5 mg via ORAL
  Filled 2020-07-29 (×5): qty 2

## 2020-07-29 MED ORDER — MORPHINE SULFATE (PF) 2 MG/ML IV SOLN
2.0000 mg | Freq: Once | INTRAVENOUS | Status: AC
Start: 2020-07-29 — End: 2020-07-29
  Administered 2020-07-29: 2 mg via INTRAVENOUS
  Filled 2020-07-29: qty 1

## 2020-07-29 MED ORDER — PHENOL 1.4 % MT LIQD: 1.0000 | OROMUCOSAL | Status: DC | PRN

## 2020-07-29 MED ORDER — DIGOXIN 125 MCG PO TABS
0.1250 mg | ORAL_TABLET | Freq: Every day | ORAL | Status: DC
  Administered 2020-07-29 – 2020-07-30 (×2): 0.125 mg via ORAL
  Filled 2020-07-29 (×2): qty 1

## 2020-07-29 MED ORDER — SODIUM CHLORIDE 0.9 % IV SOLN
8.0000 mg/h | INTRAVENOUS | Status: DC
  Administered 2020-07-29 – 2020-07-30 (×3): 8 mg/h via INTRAVENOUS
  Filled 2020-07-29 (×6): qty 80

## 2020-07-29 MED ORDER — SUCRALFATE 1 G PO TABS
1.0000 g | ORAL_TABLET | Freq: Three times a day (TID) | ORAL | Status: DC
  Administered 2020-07-29 – 2020-07-31 (×9): 1 g via ORAL
  Filled 2020-07-29 (×9): qty 1

## 2020-07-29 MED ORDER — SODIUM CHLORIDE 0.9 % IV SOLN
80.0000 mg | Freq: Once | INTRAVENOUS | Status: DC
  Filled 2020-07-29: qty 80

## 2020-07-29 MED ORDER — ACETAMINOPHEN 650 MG RE SUPP: 650.0000 mg | Freq: Four times a day (QID) | RECTAL | Status: DC | PRN

## 2020-07-29 MED ORDER — FOLIC ACID 1 MG PO TABS
1.0000 mg | ORAL_TABLET | Freq: Every day | ORAL | Status: DC
  Administered 2020-07-29 – 2020-07-30 (×2): 1 mg via ORAL
  Filled 2020-07-29 (×2): qty 1

## 2020-07-29 MED ORDER — TRAZODONE HCL 50 MG PO TABS: 25.0000 mg | ORAL_TABLET | Freq: Every evening | ORAL | Status: DC | PRN

## 2020-07-29 MED ORDER — CHLORDIAZEPOXIDE HCL 5 MG PO CAPS: 5.0000 mg | ORAL_CAPSULE | Freq: Three times a day (TID) | ORAL | Status: DC | PRN

## 2020-07-29 MED ORDER — THIAMINE HCL 100 MG PO TABS: 100.0000 mg | ORAL_TABLET | Freq: Every day | ORAL | Status: DC

## 2020-07-29 MED ORDER — INSULIN ASPART 100 UNIT/ML ~~LOC~~ SOLN: 0.0000 [IU] | SUBCUTANEOUS | Status: DC

## 2020-07-29 MED ORDER — SODIUM CHLORIDE 0.9 % IV SOLN: INTRAVENOUS | Status: DC

## 2020-07-29 MED ORDER — ACETAMINOPHEN 325 MG PO TABS: 650.0000 mg | ORAL_TABLET | Freq: Four times a day (QID) | ORAL | Status: DC | PRN

## 2020-07-29 MED ORDER — QUETIAPINE FUMARATE 50 MG PO TABS
150.0000 mg | ORAL_TABLET | Freq: Every day | ORAL | Status: DC
  Administered 2020-07-29 – 2020-07-30 (×2): 150 mg via ORAL
  Filled 2020-07-29 (×2): qty 1

## 2020-07-29 MED ORDER — RIVAROXABAN 20 MG PO TABS
20.0000 mg | ORAL_TABLET | Freq: Every evening | ORAL | Status: DC
  Administered 2020-07-29: 20 mg via ORAL
  Filled 2020-07-29: qty 1

## 2020-07-29 MED ORDER — SODIUM CHLORIDE 0.9 % IV BOLUS
500.0000 mL | Freq: Once | INTRAVENOUS | Status: AC
  Administered 2020-07-29: 500 mL via INTRAVENOUS

## 2020-07-29 MED ORDER — ONDANSETRON HCL 4 MG/2ML IJ SOLN
4.0000 mg | Freq: Four times a day (QID) | INTRAMUSCULAR | Status: DC | PRN
  Administered 2020-07-29: 4 mg via INTRAVENOUS
  Filled 2020-07-29: qty 2

## 2020-07-29 MED ORDER — CARVEDILOL 3.125 MG PO TABS
3.1250 mg | ORAL_TABLET | Freq: Two times a day (BID) | ORAL | Status: DC
  Administered 2020-07-29 – 2020-07-31 (×5): 3.125 mg via ORAL
  Filled 2020-07-29 (×5): qty 1

## 2020-07-29 MED ORDER — PANTOPRAZOLE SODIUM 40 MG IV SOLR: 40.0000 mg | Freq: Two times a day (BID) | INTRAVENOUS | Status: DC

## 2020-07-29 NOTE — Consult Note (Signed)
[facility] Consult: 2:44 PM 07/29/2020  LOS: 0 days    Referring Provider: Dr Maryland Pink  Primary Care Physician:  Lorayne Marek Charlene Brooke, NP Primary Gastroenterologist:   Seen as inpatient by [facility] in June 2021.    Reason for Consultation: FOBT positive    HPI: Robert [name] is a 40 y.o. male.  Past medical history alcohol, marijuana, cocaine use.  Nonischemic cardiomyopathy biventricular heart failure, EF 15%.  Pericardial effusion.  Small PE, on Xarelto.  AKI.  A year or more of intermittent nausea and nonbloody emesis.  Has been taking Protonix regularly.  Carafate was added 2 weeks ago by his PCP.  As needed Zofran helps.  Yesterday started having a bout of nausea and vomiting and the Zofran did not help.  He came to the ED last night.  Has bowel movements regularly but yesterday's bowel movement was small since he did not eat much.  Patient has had mid abdominal pain as well as pain on both sides of his flank.  Has not had any vomiting since about 3 AM this morning and pain is improved as well.  T bili 1.9.  Alkaline phosphatase 80.  AST/ALT 60/77 >> 44/65.  LFTs were also elevated at the end of February when he 22 when AST/ALT was 58/72, T bili 1.3 and normal alk phos  10/2019 EGD for CGE/hematemesis, black, FOBT + stools in setting of Xarelto.  Hgb 12.  By Dr. Penelope Coop.  Study revealed gastric and duodenal erythema but no varices, no portal gastropathy, no ulcers. 06/30/2020 abdominal ultrasound: Normal GB and normal CBD.  Diffuse fatty liver.  Portal vein Dopplers normal. 07/28/2020 CTAP w/o contrast: Severe diffuse fatty liver.  Thin, walled off air-filled cyst in left lower lobe of liver with a small air-fluid level.,  Question bronchiectasis versus other cause for finding.  Chest CT may help further  elucidate.  Previously smoked marijuana, but says he has not smoked in a few months.  Admits to drinking 6 airplane sized bottles of vodka a day most days of the week.     Past Medical History:  Diagnosis Date  . Alcohol use   . Alcohol withdrawal ([place]) 03/06/2020  . Asthma   . CHF (congestive heart failure) ([place])     Past Surgical History:  Procedure Laterality Date  . ESOPHAGOGASTRODUODENOSCOPY (EGD) WITH PROPOFOL N/A 11/01/2019   Procedure: ESOPHAGOGASTRODUODENOSCOPY (EGD) WITH PROPOFOL;  Surgeon: [name], MD;  Location: [facility] ENDOSCOPY;  Service: Endoscopy;  Laterality: N/A;  . RIGHT/LEFT HEART CATH AND CORONARY ANGIOGRAPHY N/A 10/06/2019   Procedure: RIGHT/LEFT HEART CATH AND CORONARY ANGIOGRAPHY;  Surgeon: [name], MD;  Location: [facility];  Service: Cardiovascular;  Laterality: N/A;  . WRIST SURGERY      Prior to Admission medications   Medication Sig Start Date End Date Taking? Authorizing Provider  carvedilol (COREG) 3.125 MG tablet Take 1 tablet (3.125 mg total) by mouth 2 (two) times daily. 04/11/20 04/11/21 Yes Clegg, Amy D, NP  dapagliflozin propanediol (FARXIGA) 10 MG TABS tablet Take 1 tablet (  10 mg total) by mouth daily before breakfast. Patient taking differently: Take 10 mg by mouth daily. 12/09/19  Yes Bensimhon, Shaune Pascal, MD  digoxin (LANOXIN) 0.125 MG tablet Take 1 tablet (0.125 mg total) by mouth daily. 06/14/20  Yes Lyda Jester M, PA-C  folic acid (FOLVITE) 1 MG tablet Take 1 tablet (1 mg total) by mouth daily. 03/16/20  Yes Bensimhon, Shaune Pascal, MD  ivabradine (CORLANOR) 7.5 MG TABS tablet Take 1 tablet (7.5 mg total) by mouth 2 (two) times daily with a meal. 03/27/20  Yes Bensimhon, Shaune Pascal, MD  ondansetron (ZOFRAN ODT) 4 MG disintegrating tablet Take 1 tablet (4 mg total) by mouth every 8 (eight) hours as needed for nausea or vomiting. 03/26/20  Yes Maudie Flakes, MD  pantoprazole (PROTONIX) 40 MG tablet Take 1 tablet (40 mg total) by  mouth daily. 06/23/20  Yes Nche, Charlene Brooke, NP  potassium chloride (KLOR-CON) 20 MEQ packet Take 20 mEq by mouth daily.   Yes [provider]  QUEtiapine (SEROQUEL) 100 MG tablet Take 1.5 tablets (150 mg total) by mouth daily. 06/23/20  Yes Nche, Charlene Brooke, NP  rivaroxaban (XARELTO) 20 MG TABS tablet Take 20 mg by mouth every evening.   Yes [provider]  sacubitril-valsartan (ENTRESTO) 97-103 MG Take 1 tablet by mouth 2 (two) times daily. 06/16/20  Yes Lyda Jester M, PA-C  spironolactone (ALDACTONE) 25 MG tablet Take 1 tablet (25 mg total) by mouth daily. HOLD until 03/26/20 03/24/20  Yes Oretha Milch D, MD  sucralfate (CARAFATE) 1 g tablet Take 1 tablet (1 g total) by mouth 4 (four) times daily -  with meals and at bedtime. 06/23/20  Yes Nche, Charlene Brooke, NP  thiamine 100 MG tablet Take 1 tablet (100 mg total) by mouth daily. 11/03/19  Yes Thurnell Lose, MD  traZODone (DESYREL) 50 MG tablet Take 50 mg by mouth at bedtime.   Yes [provider]  chlordiazePOXIDE (LIBRIUM) 5 MG capsule Take 1 capsule (5 mg total) by mouth 3 (three) times daily as needed for anxiety. 07/14/20   Nche, Charlene Brooke, NP  sertraline (ZOLOFT) 25 MG tablet Take 1 tablet (25 mg total) by mouth daily. Patient not taking: Reported on 07/28/2020 06/23/20 06/23/21  Flossie Buffy, NP    Scheduled Meds: . carvedilol  3.125 mg Oral BID WC  . digoxin  0.125 mg Oral Daily  . folic acid  1 mg Oral Daily  . insulin aspart  0-9 Units Subcutaneous Q4H  . ivabradine  7.5 mg Oral BID WC  . LORazepam  0-4 mg Intravenous Q6H   Or  . LORazepam  0-4 mg Oral Q6H  . [START ON 07/31/2020] LORazepam  0-4 mg Intravenous Q12H   Or  . [START ON 07/31/2020] LORazepam  0-4 mg Oral Q12H  . multivitamin  1 tablet Oral Daily  . [START ON 08/01/2020] pantoprazole  40 mg Intravenous Q12H  . potassium chloride  20 mEq Oral Daily  . QUEtiapine  150 mg Oral Daily  . sucralfate  1 g Oral TID WC & HS   . thiamine  100 mg Oral Daily   Or  . thiamine  100 mg Intravenous Daily  . traZODone  50 mg Oral QHS   Infusions: . sodium chloride 125 mL/hr at 07/29/20 1328  . pantoprozole (PROTONIX) infusion 8 mg/hr (07/29/20 1433)  . pantoprazole (PROTONIX) 80 mg IVPB Stopped (07/29/20 0236)   PRN Meds: acetaminophen **OR** acetaminophen, chlordiazePOXIDE, morphine injection, ondansetron **OR** ondansetron (  ZOFRAN) IV, phenol   Allergies as of 07/28/2020 - Review Complete 07/28/2020  Allergen Reaction Noted  . Bactrim [sulfamethoxazole-trimethoprim] Rash 10/30/2019    Family History  Problem Relation Age of Onset  . Heart failure Father   . Diabetes Father   . Kidney disease Father   . Heart disease Father 40  . Heart disease Maternal Grandfather 58  . Heart disease Paternal Grandfather 32    Social History   Socioeconomic History  . Marital status: Single    Spouse name: Not on file  . Number of children: Not on file  . Years of education: Not on file  . Highest education level: Not on file  Occupational History  . Occupation: Ecologist  Tobacco Use  . Smoking status: Current Every Day Smoker    Packs/day: 0.25    Years: 24.00    Pack years: 6.00    Types: Cigarettes  . Smokeless tobacco: Never Used  Vaping Use  . Vaping Use: Former  Substance and Sexual Activity  . Alcohol use: Yes    Alcohol/week: 8.0 standard drinks    Types: 8 Shots of liquor per week    Comment: heavy use since age 47  . Drug use: Yes    Frequency: 1.0 times per week    Types: Marijuana  . Sexual activity: Yes  Other Topics Concern  . Not on file  Social History Narrative  . Not on file   Social Determinants of Health   Financial Resource Strain: High Risk  . Difficulty of Paying Living Expenses: Very hard  Food Insecurity: Food Insecurity Present  . Worried About Charity fundraiser in the Last Year: Sometimes true  . Ran Out of Food in the Last Year: Sometimes true   Transportation Needs: Not on file  Physical Activity: Not on file  Stress: Not on file  Social Connections: Not on file  Intimate Partner Violence: Not on file    REVIEW OF SYSTEMS: Constitutional: Feels tired ENT:  No nose bleeds Pulm: No trouble breathing.  His voice is hoarse. CV:  No palpitations, no LE edema.  No palpitations or chest pain.  No swelling GU:  No hematuria, no frequency GI: See HPI Heme: [place] excessive or unusual bleeding or bruising. Transfusions: None. Neuro:  No headaches, no peripheral tingling or numbness no dizziness.  No syncope. Derm:  No itching, no rash or sores.  Endocrine:  No sweats or chills.  No polyuria or dysuria Immunization: Reviewed.  Has not been vaccinated for COVID-19 Travel:  None beyond local counties in last few months.    PHYSICAL EXAM: Vital signs in last 24 hours: Vitals:   07/29/20 1241 07/29/20 1324  BP: 108/81 (!) 133/94  Pulse: 80 78  Resp: 13 18  Temp:  (!) 97.5 F (36.4 C)  SpO2: 99% 99%   Wt Readings from Last 3 Encounters:  07/29/20 85.9 kg  07/20/20 89.3 kg  07/10/20 87.9 kg    General: Resting comfortably on the bed.  Looks moderately ill.  Alert. Head: No facial asymmetry or swelling.  No signs of head trauma. Eyes: No scleral icterus.  No conjunctival pallor. Ears: Not hard of hearing Nose: Sounds congested.  No drainage. Mouth: Oropharynx moist, pink, clear.  Tongue midline. Neck: No JVD, no masses, no thyromegaly Lungs: Clear bilaterally.  No labored breathing.  No cough.  Voice is hoarse/raspy. Heart: RRR.  No MRG.  S1, S2 present. Abdomen: Soft, slightly obese.  Active bowel sounds.  Minimal if any tenderness.  No guarding or rebound.  No HSM, masses, bruits, hernias.   Rectal: Deferred Musc/Skeltl: No joint redness, swelling or deformities. Extremities: No CCE. Neurologic: Oriented x3.  No tremors.  No diaphoresis.  Answers questions appropriately.  Moves all 4 limbs.  No obvious weakness. Skin:  No telangiectasia, sores or rash Nodes: No cervical adenopathy Psych: Cooperative, calm.  Intake/Output from previous day: No intake/output data recorded. Intake/Output this shift: No intake/output data recorded.  LAB RESULTS: Recent Labs    07/28/20 2141 07/29/20 0131 07/29/20 0458 07/29/20 0731 07/29/20 1317  WBC 8.1  --  8.7  --   --   HGB 14.8   < > 13.4 12.2* 11.4*  HCT 43.9   < > 41.1 37.0* 34.5*  PLT 388  --  326  --   --    < > = values in this interval not displayed.   BMET Lab Results  Component Value Date   NA 139 07/29/2020   NA 138 07/28/2020   NA 139 07/12/2020   K 4.3 07/29/2020   K 4.3 07/28/2020   K 3.6 07/12/2020   CL 91 (L) 07/29/2020   CL 91 (L) 07/28/2020   CL 107 07/12/2020   CO2 24 07/29/2020   CO2 18 (L) 07/28/2020   CO2 22 07/12/2020   GLUCOSE 153 (H) 07/29/2020   GLUCOSE 155 (H) 07/28/2020   GLUCOSE 102 (H) 07/12/2020   BUN 28 (H) 07/29/2020   BUN 21 (H) 07/28/2020   BUN 16 07/12/2020   CREATININE 2.78 (H) 07/29/2020   CREATININE 2.25 (H) 07/28/2020   CREATININE 1.13 07/12/2020   CALCIUM 9.3 07/29/2020   CALCIUM 10.2 07/28/2020   CALCIUM 8.5 (L) 07/12/2020   LFT Recent Labs    07/28/20 2141 07/29/20 0458  PROT 10.1* 9.3*  ALBUMIN 5.6* 5.2*  AST 60* 44*  ALT 77* 65*  ALKPHOS 90 80  BILITOT 1.4* 1.9*   PT/INR Lab Results  Component Value Date   INR 1.4 (H) 07/29/2020   INR 1.1 07/10/2020   INR 1.3 (H) 03/07/2020   Hepatitis Panel No results for input(s): HEPBSAG, HCVAB, HEPAIGM, HEPBIGM in the last 72 hours. C-Diff No components found for: CDIFF Lipase     Component Value Date/Time   LIPASE 26 07/28/2020 2141    Drugs of Abuse     Component Value Date/Time   LABOPIA POSITIVE (A) 07/28/2020 0005   COCAINSCRNUR NONE DETECTED 07/28/2020 0005   COCAINSCRNUR Negative 11/14/2019 2025   LABBENZ NONE DETECTED 07/28/2020 0005   AMPHETMU NONE DETECTED 07/28/2020 0005   THCU POSITIVE (A) 07/28/2020 0005   LABBARB  NONE DETECTED 07/28/2020 0005     RADIOLOGY STUDIES: CT Abdomen Pelvis Wo Contrast  Result Date: 07/29/2020 CLINICAL DATA:  Left side chest and upper abdominal pain. EXAM: CT ABDOMEN AND PELVIS WITHOUT CONTRAST TECHNIQUE: Multidetector CT imaging of the abdomen and pelvis was performed following the standard protocol without IV contrast. COMPARISON:  None. FINDINGS: Lower chest: Thin walled cyst noted in the left lower lobe with air-fluid level. Adjacent scarring. No effusions. Heart is normal size. Hepatobiliary: Severe diffuse low-density throughout the liver compatible with fatty infiltration. Gallbladder unremarkable. Pancreas: No focal abnormality or ductal dilatation. Spleen: No focal abnormality.  Normal size. Adrenals/Urinary Tract: No adrenal abnormality. No focal renal abnormality. No stones or hydronephrosis. Urinary bladder is unremarkable. Stomach/Bowel: Normal appendix. Diffuse low-density throughout the colonic wall may reflect fatty proliferation from prior/remote inflammatory bowel disease. No current wall thickening. Stomach  and small bowel decompressed, grossly unremarkable. No bowel obstruction. Vascular/Lymphatic: No evidence of aneurysm or adenopathy. Reproductive: No visible focal abnormality. Other: No free fluid or free air. Musculoskeletal: No acute bony abnormality. IMPRESSION: Severe diffuse fatty infiltration of the liver. Thin walled air-filled cyst in the left lower lobe posteriorly with small air-fluid level. Is difficult to determine if this represents bronchiectasis or other cause of thin walled cyst. Chest CT may be helpful for further evaluation. No acute findings in the abdomen or pelvis. Electronically Signed   By: Rolm Baptise M.D.   On: 07/29/2020 00:00   DG Chest Port 1 View  Result Date: 07/28/2020 CLINICAL DATA:  Chest pain EXAM: PORTABLE CHEST 1 VIEW COMPARISON:  07/10/2020 FINDINGS: Heart and mediastinal contours are within normal limits. No focal opacities or  effusions. No acute bony abnormality. IMPRESSION: No active disease. Electronically Signed   By: Rolm Baptise M.D.   On: 07/28/2020 21:59     IMPRESSION:   *   Recurrent bouts of nausea, vomiting.   Fatty liver on ultrasound and CT imaging.  Patient drinks heavily on a near daily basis. Suspect alcoholic gastritis versus reflux disease or likely combination of both.  Possible peptic ulcer disease.  *    alcohol abuse.  Treated for alcohol withdrawal symptoms in October 2021, 03/2020 admission for altered mental status in setting of alcohol abuse. Had inpatient detox at [facility] but resumed drinking.  *    nonis ischemic cardiomyopathy.  Dr. Haroldine Laws suspect alcohol and cocaine played a role.  Congestive heart failure.  Biventricular.  EF 15%.  *   Chronic Xarelto.  History of PE 09/2019, no DVT.  PE was small and Dr. Haroldine Laws mention that risk of ongoing anticoagulation likely outweighs benefits at office visit note of 07/10/2020.  Patient remains on Xarelto, but ran out of it and has not been taking it for several days.[name]    PLAN:     *   Needs to stop drinking. ?  EGD.  Will defer decision to Dr. Tarri Glenn  *    patient requesting food and feels he can eat solid food so I ordered a heart healthy diet.   Azucena Freed  07/29/2020, 2:44 PM Phone [phone]

## 2020-07-29 NOTE — ED Notes (Addendum)
PT REFUSES TO STAY NPO AND HAS BEEN DRINKING WATER OUT OF FAUCET IN ROOM. Educated pt multiple times reasons for being NPO. Mother at bedside

## 2020-07-29 NOTE — ED Notes (Signed)
Rounded on pt. Leads placed back on again. Pt keeps pulling them and the pulse ox off.

## 2020-07-29 NOTE — H&P (Signed)
[place]   PATIENT NAME: Robert [name]    MR#:  [id number]  DATE OF BIRTH:  [birth year]  DATE OF ADMISSION:  07/28/2020  PRIMARY CARE PHYSICIAN: Flossie Buffy, NP   Patient is coming from: Home  REQUESTING/REFERRING PHYSICIAN: Loni Beckwith, PA-C  CHIEF COMPLAINT:   Chief Complaint  Patient presents with  . Chest Pain  . Abdominal Pain    HISTORY OF PRESENT ILLNESS:  Robert [name] is a 40 y.o. African-American male with medical history significant for alcohol abuse, asthma and CHF, presented to emergency room with acute onset of recurrent nausea and vomiting with associated lower abdominal pain as well as epigastric and substernal chest pain felt as heartburn.  The patient had positive stool Hemoccult in the ER.  His last alcoholic drink was 2 days ago.  He admitted to hiccups with his nausea and vomiting.  Is been jittery and tremulous in the ER and was thought to be in alcohol withdrawal.  He admitted to chills but did not check his temperature.  He thought he noted melanotic stools and has been having occasional blood tinged with his vomitus.  No cough or wheezing or hemoptysis.  No dysuria, oliguria or hematuria or flank pain.  No other bleeding diathesis. ED Course: When he came to the ER, heart rate was 127 with respiratory to 33 with otherwise normal vital signs.  Labs revealed blood glucose of 155 and a CO2 of 18 with a BUN of 21 and creatinine 2.25 up from normal levels earlier this month, anion gap of 29 up from 10 then with albumin of 5.6 and total protein of 10.1 serum lipase of 26 and AST 60 ALT 77 total bili 1.4.  His BNP was 72.2 and high-sensitivity troponin I was 15 with a CK of 189.  CBC was unremarkable.  Urinalysis showed more than 500 glucose and was otherwise unremarkable.  Urine drug screen was positive for opiates and tetrahydrocannabinol with.  Alcohol level was 48.  Stool occult came back positive. EKG as reviewed by me : Sinus tachycardia  with a rate of 123 with left atrial enlargement and likely biatrial enlargement, probable LVH and T wave inversion laterally with prolonged QT interval and QTc of 480 EMS. Imaging: Portable chest ray showed no acute cardiopulmonary disease. CT of the abdomen pelvis showed the following: Severe diffuse fatty infiltration of the liver.  Thin walled air-filled cyst in the left lower lobe posteriorly with small air-fluid level. Is difficult to determine if this represents bronchiectasis or other cause of thin walled cyst. Chest CT may be helpful for further evaluation.  No acute findings in the abdomen or pelvis.  The patient was given IV Zofran and 500 mill IV normal saline bolus twice.  He will be admitted to a telemetry monitored bed for further evaluation and management PAST MEDICAL HISTORY:   Past Medical History:  Diagnosis Date  . Alcohol use   . Alcohol withdrawal ([place]) 03/06/2020  . Asthma   . CHF (congestive heart failure) ([place])     PAST SURGICAL HISTORY:   Past Surgical History:  Procedure Laterality Date  . ESOPHAGOGASTRODUODENOSCOPY (EGD) WITH PROPOFOL N/A 11/01/2019   Procedure: ESOPHAGOGASTRODUODENOSCOPY (EGD) WITH PROPOFOL;  Surgeon: [name], MD;  Location: [facility] ENDOSCOPY;  Service: Endoscopy;  Laterality: N/A;  . RIGHT/LEFT HEART CATH AND CORONARY ANGIOGRAPHY N/A 10/06/2019   Procedure: RIGHT/LEFT HEART CATH AND CORONARY ANGIOGRAPHY;  Surgeon: [name], MD;  Location: [facility];  Service: Cardiovascular;  Laterality: N/A;  . WRIST SURGERY      SOCIAL HISTORY:   Social History   Tobacco Use  . Smoking status: Current Every Day Smoker    Packs/day: 0.25    Years: 24.00    Pack years: 6.00    Types: Cigarettes  . Smokeless tobacco: Never Used  Substance Use Topics  . Alcohol use: Yes    Alcohol/week: 8.0 standard drinks    Types: 8 Shots of liquor per week    Comment: heavy use since age 65    FAMILY HISTORY:   Family History   Problem Relation Age of Onset  . Heart failure Father   . Diabetes Father   . Kidney disease Father   . Heart disease Father 78  . Heart disease Maternal Grandfather 3  . Heart disease Paternal Grandfather 31    DRUG ALLERGIES:   Allergies  Allergen Reactions  . Bactrim [Sulfamethoxazole-Trimethoprim] Rash    REVIEW OF SYSTEMS:   ROS As per history of present illness. All pertinent systems were reviewed above. Constitutional, HEENT, cardiovascular, respiratory, GI, GU, musculoskeletal, neuro, psychiatric, endocrine, integumentary and hematologic systems were reviewed and are otherwise negative/unremarkable except for positive findings mentioned above in the HPI.   MEDICATIONS AT HOME:   Prior to Admission medications   Medication Sig Start Date End Date Taking? Authorizing Provider  carvedilol (COREG) 3.125 MG tablet Take 1 tablet (3.125 mg total) by mouth 2 (two) times daily. 04/11/20 04/11/21 Yes Clegg, Amy D, NP  dapagliflozin propanediol (FARXIGA) 10 MG TABS tablet Take 1 tablet (10 mg total) by mouth daily before breakfast. Patient taking differently: Take 10 mg by mouth daily. 12/09/19  Yes Bensimhon, Shaune Pascal, MD  digoxin (LANOXIN) 0.125 MG tablet Take 1 tablet (0.125 mg total) by mouth daily. 06/14/20  Yes Lyda Jester M, PA-C  folic acid (FOLVITE) 1 MG tablet Take 1 tablet (1 mg total) by mouth daily. 03/16/20  Yes Bensimhon, Shaune Pascal, MD  ivabradine (CORLANOR) 7.5 MG TABS tablet Take 1 tablet (7.5 mg total) by mouth 2 (two) times daily with a meal. 03/27/20  Yes Bensimhon, Shaune Pascal, MD  ondansetron (ZOFRAN ODT) 4 MG disintegrating tablet Take 1 tablet (4 mg total) by mouth every 8 (eight) hours as needed for nausea or vomiting. 03/26/20  Yes Maudie Flakes, MD  pantoprazole (PROTONIX) 40 MG tablet Take 1 tablet (40 mg total) by mouth daily. 06/23/20  Yes Nche, Charlene Brooke, NP  potassium chloride (KLOR-CON) 20 MEQ packet Take 20 mEq by mouth daily.   Yes [provider]  QUEtiapine (SEROQUEL) 100 MG tablet Take 1.5 tablets (150 mg total) by mouth daily. 06/23/20  Yes Nche, Charlene Brooke, NP  rivaroxaban (XARELTO) 20 MG TABS tablet Take 20 mg by mouth every evening.   Yes [provider]  sacubitril-valsartan (ENTRESTO) 97-103 MG Take 1 tablet by mouth 2 (two) times daily. 06/16/20  Yes Lyda Jester M, PA-C  spironolactone (ALDACTONE) 25 MG tablet Take 1 tablet (25 mg total) by mouth daily. HOLD until 03/26/20 03/24/20  Yes Oretha Milch D, MD  sucralfate (CARAFATE) 1 g tablet Take 1 tablet (1 g total) by mouth 4 (four) times daily -  with meals and at bedtime. 06/23/20  Yes Nche, Charlene Brooke, NP  thiamine 100 MG tablet Take 1 tablet (100 mg total) by mouth daily. 11/03/19  Yes Thurnell Lose, MD  traZODone (DESYREL) 50 MG tablet Take 50 mg by mouth at bedtime.  Yes [provider]  chlordiazePOXIDE (LIBRIUM) 5 MG capsule Take 1 capsule (5 mg total) by mouth 3 (three) times daily as needed for anxiety. 07/14/20   Nche, Charlene Brooke, NP  sertraline (ZOLOFT) 25 MG tablet Take 1 tablet (25 mg total) by mouth daily. Patient not taking: Reported on 07/28/2020 06/23/20 06/23/21  Nche, Charlene Brooke, NP      VITAL SIGNS:  Blood pressure (!) 122/98, pulse (!) 118, temperature 98.2 F (36.8 C), resp. rate 20, height 5\' 10"  (1.778 m), weight 86.2 kg, SpO2 100 %.  PHYSICAL EXAMINATION:  Physical Exam  GENERAL:  40 y.o.-year-old African-American male patient lying in the bed with  mild distress from hiccups.  He was slightly restless and jittery. EYES: Pupils equal, round, reactive to light and accommodation. No scleral icterus. Extraocular muscles intact.  HEENT: Head atraumatic, normocephalic. Oropharynx and nasopharynx clear.  NECK:  Supple, no jugular venous distention. No thyroid enlargement, no tenderness.  LUNGS: Normal breath sounds bilaterally, no wheezing, rales,rhonchi or crepitation. No use of accessory muscles of  respiration.  CARDIOVASCULAR: Regular rate and rhythm, S1, S2 normal. No murmurs, rubs, or gallops.  ABDOMEN: Soft, nondistended, with mild epigastric tenderness without rebound tenderness guarding or rigidity.. Bowel sounds present. No organomegaly or mass.  The patient was having hiccups. EXTREMITIES: No pedal edema, cyanosis, or clubbing.  NEUROLOGIC: Cranial nerves II through XII are intact. Muscle strength 5/5 in all extremities. Sensation intact. Gait not checked.  PSYCHIATRIC: The patient is alert and oriented x 3.  Normal affect and good eye contact. SKIN: No obvious rash, lesion, or ulcer.   LABORATORY PANEL:   CBC Recent Labs  Lab 07/28/20 2141  WBC 8.1  HGB 14.8  HCT 43.9  PLT 388   ------------------------------------------------------------------------------------------------------------------  Chemistries  Recent Labs  Lab 07/28/20 2141  NA 138  K 4.3  CL 91*  CO2 18*  GLUCOSE 155*  BUN 21*  CREATININE 2.25*  CALCIUM 10.2  AST 60*  ALT 77*  ALKPHOS 90  BILITOT 1.4*   ------------------------------------------------------------------------------------------------------------------  Cardiac Enzymes No results for input(s): TROPONINI in the last 168 hours. ------------------------------------------------------------------------------------------------------------------  RADIOLOGY:  CT Abdomen Pelvis Wo Contrast  Result Date: 07/29/2020 CLINICAL DATA:  Left side chest and upper abdominal pain. EXAM: CT ABDOMEN AND PELVIS WITHOUT CONTRAST TECHNIQUE: Multidetector CT imaging of the abdomen and pelvis was performed following the standard protocol without IV contrast. COMPARISON:  None. FINDINGS: Lower chest: Thin walled cyst noted in the left lower lobe with air-fluid level. Adjacent scarring. No effusions. Heart is normal size. Hepatobiliary: Severe diffuse low-density throughout the liver compatible with fatty infiltration. Gallbladder unremarkable. Pancreas:  No focal abnormality or ductal dilatation. Spleen: No focal abnormality.  Normal size. Adrenals/Urinary Tract: No adrenal abnormality. No focal renal abnormality. No stones or hydronephrosis. Urinary bladder is unremarkable. Stomach/Bowel: Normal appendix. Diffuse low-density throughout the colonic wall may reflect fatty proliferation from prior/remote inflammatory bowel disease. No current wall thickening. Stomach and small bowel decompressed, grossly unremarkable. No bowel obstruction. Vascular/Lymphatic: No evidence of aneurysm or adenopathy. Reproductive: No visible focal abnormality. Other: No free fluid or free air. Musculoskeletal: No acute bony abnormality. IMPRESSION: Severe diffuse fatty infiltration of the liver. Thin walled air-filled cyst in the left lower lobe posteriorly with small air-fluid level. Is difficult to determine if this represents bronchiectasis or other cause of thin walled cyst. Chest CT may be helpful for further evaluation. No acute findings in the abdomen or pelvis. Electronically Signed   By: Rolm Baptise M.D.  On: 07/29/2020 00:00   DG Chest Port 1 View  Result Date: 07/28/2020 CLINICAL DATA:  Chest pain EXAM: PORTABLE CHEST 1 VIEW COMPARISON:  07/10/2020 FINDINGS: Heart and mediastinal contours are within normal limits. No focal opacities or effusions. No acute bony abnormality. IMPRESSION: No active disease. Electronically Signed   By: Rolm Baptise M.D.   On: 07/28/2020 21:59      IMPRESSION AND PLAN:  Active Problems:   AKI (acute kidney injury) ([place])  1.  Intractable nausea and vomiting with subsequent volume depletion and dehydration and prerenal acute kidney injury. -The patient will be admitted to a telemetry bed. -We will continue hydration with IV normal saline. -We will follow renal functions. -We will hold off nephrotoxins.  2.  GI bleeding.  I suspect upper GI etiology. -The patient was typed and screened. -We will follow serial hemoglobins and  hematocrits. -We will obtain a GI consultation. -Dr. Alessandra Bevels was notified about the patient. -He will be kept n.p.o. for now. -IV PPI therapy will be provided as well as p.o. Carafate. -His Xarelto will be held off.  3.  Alcohol withdrawal. -The patient will be placed on CIWA protocol. -As needed IV Ativan will be provided. -Multivitamins, folic acid and thiamine will be given daily.  4.  Essential hypertension. -We will continue his Coreg and hold off his Entresto..  5.  Chronic CHF with cardiomyopathy. -We will continue his Lanoxin and Corlanor.  6.  Depression. -We will continue Zoloft and Seroquel as well as trazodone.  DVT prophylaxis: SCDs.   Medical prophylaxis currently contraindicated due to GI bleeding. Code Status: full code. Family Communication:  The plan of care was discussed in details with the patient (and family). I answered all questions. The patient agreed to proceed with the above mentioned plan. Further management will depend upon hospital course. Disposition Plan: Back to previous home environment Consults called: GI consultation as above. All the records are reviewed and case discussed with ED provider.  Status is: Inpatient  Remains inpatient appropriate because:Ongoing active pain requiring inpatient pain management, Ongoing diagnostic testing needed not appropriate for outpatient work up, Unsafe d/c plan, IV treatments appropriate due to intensity of illness or inability to take PO and Inpatient level of care appropriate due to severity of illness   Dispo: The patient is from: Home              Anticipated d/c is to: Home              Patient currently is not medically stable to d/c.   Difficult to place patient No   TOTAL TIME TAKING CARE OF THIS PATIENT: 55 minutes.    Christel Mormon M.D on 07/29/2020 at 2:37 AM  [facility]   From 7 PM-7 AM, contact night-coverage [email or web address]  CC: Primary care physician; Nche, Charlene Brooke, NP

## 2020-07-29 NOTE — Progress Notes (Addendum)
[facility] PROGRESS NOTE   Robert [name] IPJ:[id number] DOB: 1981/03/19 DOA: 07/28/2020  PCP: Flossie Buffy, NP  Brief History/Interval Summary: 40 y.o. African-American male with medical history significant for alcohol abuse, asthma and chronic systolic CHF, presented to emergency room with acute onset of recurrent nausea and vomiting with associated lower abdominal pain as well as epigastric and substernal chest pain felt as heartburn.    Patient reported black-colored stools as well as some blood-tinged emesis.  Patient was found to have heme positive stool.  He was hospitalized for further management.     Consultants: Gastroenterology  Procedures: None yet  Antibiotics: Anti-infectives (From admission, onward)   None      Subjective/Interval History: Patient appears to be quite upset.  He wants to eat and drink.  He was told the reason why he cannot be allowed to do that quite yet.  His mother is at the bedside.  He continues to have some abdominal discomfort although it is better than before.  Denies any other complaints at this time.    Assessment/Plan:  Nausea vomiting/concern for GI bleeding Patient's symptoms appear to have improved.  His abdomen is benign to examination.  His LFTs noted to be abnormal.  Bilirubin is also mildly elevated.  Alkaline phosphatase is normal. CT scan showed severe diffuse fatty infiltration of the liver.  No other acute findings noted.   Due to concern for GI bleeding gastroenterology was consulted.  Hemoglobin noted to be stable for the most part.  Stool was positive for blood.  No overt bleeding noted as yet.  Could have had a Mallory-Weiss tear from his nausea and vomiting.  Continue PPI.  Hold his anticoagulation.  Acute kidney injury Baseline creatinine around 1.2-1.5.  Presented with creatinine of 2.25.  Noted to be 2.79 this morning.  Will increase the rate of IV fluids.  Monitor urine output.  No abnormality noted in  the GU tract on the CT scan.  Avoid nephrotoxic agents.  Concern for cyst in the left lower lung This was incidentally noted on CT abdomen and pelvis.  Patient does not have any respiratory symptoms.  May need to do a CT chest.  Will wait and see if his renal function improves.  Will consider this in the next 24 to 48 hours.  Chronic systolic CHF Followed by cardiology.  Patient denies being on diuretics.  He has noted to be on digoxin, ivabradine and carvedilol.  Echocardiogram from February of this year shows EF to be 30%. Entresto being held due to acute kidney injury.  History of PE On Xarelto prior to admission.  Currently on hold.  Transaminitis Mild elevation in AST and ALT noted.  Seen previously as well.  No significant abnormality in the hepatobiliary system except for the severe diffuse fatty infiltration noted in the liver.  Continue to monitor closely.  History of alcohol abuse/mild alcohol withdrawal Patient currently on CIWA protocol.  Last consumption of alcohol was about 24 hours prior to admission.  Monitor closely.  Patient counseled.  Continue folic acid multivitamins and thiamine.  Essential hypertension Monitor blood pressures closely.  History of for depression and anxiety Continue home medications including Zoloft and Seroquel.  Diabetes mellitus type 2 At home patient supposed to be on Farxiga, monitor CBGs.  HbA1c 5.6 when checked in February.  DVT Prophylaxis: SCDs Code Status: Full code Family Communication: Discussed with the patient and his mother Disposition Plan: Hopefully return home when improved  Status is: Inpatient  Remains inpatient appropriate because:IV treatments appropriate due to intensity of illness or inability to take PO and Inpatient level of care appropriate due to severity of illness   Dispo: The patient is from: Home              Anticipated d/c is to: Home              Patient currently is not medically stable to d/c.    Difficult to place patient No       Medications:  Scheduled: . carvedilol  3.125 mg Oral BID WC  . dapagliflozin propanediol  10 mg Oral Daily  . digoxin  0.125 mg Oral Daily  . folic acid  1 mg Oral Daily  . ivabradine  7.5 mg Oral BID WC  . LORazepam  0-4 mg Intravenous Q6H   Or  . LORazepam  0-4 mg Oral Q6H  . [START ON 07/31/2020] LORazepam  0-4 mg Intravenous Q12H   Or  . [START ON 07/31/2020] LORazepam  0-4 mg Oral Q12H  . multivitamin  1 tablet Oral Daily  . [START ON 08/01/2020] pantoprazole  40 mg Intravenous Q12H  . potassium chloride  20 mEq Oral Daily  . QUEtiapine  150 mg Oral Daily  . sucralfate  1 g Oral TID WC & HS  . thiamine  100 mg Oral Daily   Or  . thiamine  100 mg Intravenous Daily  . traZODone  50 mg Oral QHS   Continuous: . sodium chloride 100 mL/hr at 07/29/20 1230  . pantoprozole (PROTONIX) infusion 8 mg/hr (07/29/20 1230)  . pantoprazole (PROTONIX) 80 mg IVPB Stopped (07/29/20 0236)   YOV:ZCHYIFOYDXAJO **OR** acetaminophen, chlordiazePOXIDE, morphine injection, ondansetron **OR** ondansetron (ZOFRAN) IV, phenol   Objective:  Vital Signs  Vitals:   07/29/20 0740 07/29/20 0800 07/29/20 1027 07/29/20 1111  BP: 136/79 (!) 145/93 138/77 129/85  Pulse:  (!) 112 99 95  Resp:  16  15  Temp:  98.7 F (37.1 C)    SpO2: 100% 100%  100%  Weight:      Height:       No intake or output data in the 24 hours ending 07/29/20 1227 Filed Weights   07/28/20 2125  Weight: 86.2 kg    General appearance: Awake alert.  In no distress.  Somewhat irritable Resp: Clear to auscultation bilaterally.  Normal effort Cardio: S1-S2 is normal regular.  No S3-S4.  No rubs murmurs or bruit GI: Abdomen is soft.  Nontender nondistended.  Bowel sounds are present normal.  No masses organomegaly Extremities: No edema.  Full range of motion of lower extremities. Neurologic:  No focal neurological deficits.    Lab Results:  Data Reviewed: I have personally reviewed  following labs and imaging studies  CBC: Recent Labs  Lab 07/28/20 2141 07/29/20 0131 07/29/20 0458 07/29/20 0731  WBC 8.1  --  8.7  --   HGB 14.8 13.2 13.4 12.2*  HCT 43.9 39.0 41.1 37.0*  MCV 90.7  --  91.9  --   PLT 388  --  326  --     Basic Metabolic Panel: Recent Labs  Lab 07/28/20 2141 07/29/20 0458  NA 138 139  K 4.3 4.3  CL 91* 91*  CO2 18* 24  GLUCOSE 155* 153*  BUN 21* 28*  CREATININE 2.25* 2.78*  CALCIUM 10.2 9.3    GFR: Estimated Creatinine Clearance: 37.2 mL/min (A) (by C-G formula based on SCr of 2.78 mg/dL (H)).  Liver Function Tests: Recent  Labs  Lab 07/28/20 2141 07/29/20 0458  AST 60* 44*  ALT 77* 65*  ALKPHOS 90 80  BILITOT 1.4* 1.9*  PROT 10.1* 9.3*  ALBUMIN 5.6* 5.2*    Recent Labs  Lab 07/28/20 2141  LIPASE 26    Coagulation Profile: Recent Labs  Lab 07/29/20 0458  INR 1.4*    Cardiac Enzymes: Recent Labs  Lab 07/28/20 2200  CKTOTAL 189     Radiology Studies: CT Abdomen Pelvis Wo Contrast  Result Date: 07/29/2020 CLINICAL DATA:  Left side chest and upper abdominal pain. EXAM: CT ABDOMEN AND PELVIS WITHOUT CONTRAST TECHNIQUE: Multidetector CT imaging of the abdomen and pelvis was performed following the standard protocol without IV contrast. COMPARISON:  None. FINDINGS: Lower chest: Thin walled cyst noted in the left lower lobe with air-fluid level. Adjacent scarring. No effusions. Heart is normal size. Hepatobiliary: Severe diffuse low-density throughout the liver compatible with fatty infiltration. Gallbladder unremarkable. Pancreas: No focal abnormality or ductal dilatation. Spleen: No focal abnormality.  Normal size. Adrenals/Urinary Tract: No adrenal abnormality. No focal renal abnormality. No stones or hydronephrosis. Urinary bladder is unremarkable. Stomach/Bowel: Normal appendix. Diffuse low-density throughout the colonic wall may reflect fatty proliferation from prior/remote inflammatory bowel disease. No current  wall thickening. Stomach and small bowel decompressed, grossly unremarkable. No bowel obstruction. Vascular/Lymphatic: No evidence of aneurysm or adenopathy. Reproductive: No visible focal abnormality. Other: No free fluid or free air. Musculoskeletal: No acute bony abnormality. IMPRESSION: Severe diffuse fatty infiltration of the liver. Thin walled air-filled cyst in the left lower lobe posteriorly with small air-fluid level. Is difficult to determine if this represents bronchiectasis or other cause of thin walled cyst. Chest CT may be helpful for further evaluation. No acute findings in the abdomen or pelvis. Electronically Signed   By: Rolm Baptise M.D.   On: 07/29/2020 00:00   DG Chest Port 1 View  Result Date: 07/28/2020 CLINICAL DATA:  Chest pain EXAM: PORTABLE CHEST 1 VIEW COMPARISON:  07/10/2020 FINDINGS: Heart and mediastinal contours are within normal limits. No focal opacities or effusions. No acute bony abnormality. IMPRESSION: No active disease. Electronically Signed   By: Rolm Baptise M.D.   On: 07/28/2020 21:59       LOS: 0 days   [facility] Pager on [email or web address]  07/29/2020, 12:27 PM

## 2020-07-30 DIAGNOSIS — F1020 Alcohol dependence, uncomplicated: Secondary | ICD-10-CM

## 2020-07-30 LAB — COMPREHENSIVE METABOLIC PANEL
ALT: 42 U/L (ref 0–44)
AST: 36 U/L (ref 15–41)
Albumin: 3.8 g/dL (ref 3.5–5.0)
Alkaline Phosphatase: 54 U/L (ref 38–126)
Anion gap: 12 (ref 5–15)
BUN: 30 mg/dL — ABNORMAL HIGH (ref 6–20)
CO2: 25 mmol/L (ref 22–32)
Calcium: 8.2 mg/dL — ABNORMAL LOW (ref 8.9–10.3)
Chloride: 101 mmol/L (ref 98–111)
Creatinine, Ser: 1.75 mg/dL — ABNORMAL HIGH (ref 0.61–1.24)
GFR, Estimated: 50 mL/min — ABNORMAL LOW (ref >60)
Glucose, Bld: 87 mg/dL (ref 70–99)
Potassium: 3.7 mmol/L (ref 3.5–5.1)
Sodium: 138 mmol/L (ref 135–145)
Total Bilirubin: 1.4 mg/dL — ABNORMAL HIGH (ref 0.3–1.2)
Total Protein: 6.8 g/dL (ref 6.5–8.1)

## 2020-07-30 LAB — GLUCOSE, CAPILLARY
Glucose-Capillary: 102 mg/dL — ABNORMAL HIGH (ref 70–99)
Glucose-Capillary: 102 mg/dL — ABNORMAL HIGH (ref 70–99)
Glucose-Capillary: 109 mg/dL — ABNORMAL HIGH (ref 70–99)
Glucose-Capillary: 109 mg/dL — ABNORMAL HIGH (ref 70–99)
Glucose-Capillary: 78 mg/dL (ref 70–99)
Glucose-Capillary: 83 mg/dL (ref 70–99)

## 2020-07-30 LAB — CBC
HCT: 32.3 % — ABNORMAL LOW (ref 39.0–52.0)
Hemoglobin: 10.4 g/dL — ABNORMAL LOW (ref 13.0–17.0)
MCH: 30.3 pg (ref 26.0–34.0)
MCHC: 32.2 g/dL (ref 30.0–36.0)
MCV: 94.2 fL (ref 80.0–100.0)
Platelets: 179 10*3/uL (ref 150–400)
RBC: 3.43 MIL/uL — ABNORMAL LOW (ref 4.22–5.81)
RDW: 14.5 % (ref 11.5–15.5)
WBC: 5.3 10*3/uL (ref 4.0–10.5)
nRBC: 0 % (ref 0.0–0.2)

## 2020-07-30 MED ORDER — PANTOPRAZOLE SODIUM 40 MG PO TBEC
40.0000 mg | DELAYED_RELEASE_TABLET | Freq: Every day | ORAL | Status: DC
  Administered 2020-07-30: 40 mg via ORAL
  Filled 2020-07-30: qty 1

## 2020-07-30 MED ORDER — ALUM & MAG HYDROXIDE-SIMETH 200-200-20 MG/5ML PO SUSP
30.0000 mL | ORAL | Status: DC | PRN
  Administered 2020-07-30: 30 mL via ORAL
  Filled 2020-07-30: qty 30

## 2020-07-30 NOTE — Progress Notes (Addendum)
[facility] PROGRESS NOTE   Captain Blucher [id number] DOB: 07-Jul-1980 DOA: 07/28/2020  PCP: Flossie Buffy, NP  Brief History/Interval Summary: 40 y.o. African-American male with medical history significant for alcohol abuse, asthma and chronic systolic CHF, presented to emergency room with acute onset of recurrent nausea and vomiting with associated lower abdominal pain as well as epigastric and substernal chest pain felt as heartburn.    Patient reported black-colored stools as well as some blood-tinged emesis.  Patient was found to have heme positive stool.  He was hospitalized for further management.     Consultants: Gastroenterology  Procedures: None yet  Antibiotics: Anti-infectives (From admission, onward)   None      Subjective/Interval History: Patient noted to be ambulating in the hallway.  States that he is feeling better.  No further episodes of nausea or vomiting.  Occasional abdominal discomfort is noted.      Assessment/Plan:  Nausea vomiting/concern for GI bleeding Symptoms probably due to his alcohol use and gastritis.  Continue PPI.  Symptomatically he appears to have improved.  LFTs noted to be normal.  Bilirubin slightly elevated.   Due to concern for GI bleeding gastroenterology was consulted.  Hemoglobin did drop some which could be dilutional but stable for the last 24 hours.  Could have had a small Mallory-Weiss tear.  Waiting on endoscopy this morning.    Acute kidney injury Baseline creatinine around 1.2-1.5.  Presented with creatinine of 2.25.  Worsened to 2.79 yesterday.  Rate of IV fluids was increased.  Improved to 1.75 today.  Monitor urine output.  No abnormality noted in the GU tract on CT scan.  Avoid nephrotoxic agents.  Cut back on IV fluids.    Normocytic anemia/mild acute blood loss anemia Drop in hemoglobin likely dilutional along with some element of acute blood loss.  Stable for the most part.  Concern for cyst in the  left lower lung This was incidentally noted on CT abdomen and pelvis.  Patient does not have any respiratory symptoms.  May need to do a CT chest.  May be able to do only a noncontrast CT scan service creatinine may not improve significantly.  We will try to do this later today.  ADDENDUM Previous films reviewed. CTA from May 2021 revealed a small pneumatocele in the same area. Likely same issue. Will defer to OP providers. No indication for CT at this time considering no respiratory symptoms.   Chronic systolic CHF Followed by cardiology.  Patient denies being on diuretics.  He has noted to be on digoxin, ivabradine and carvedilol.  Echocardiogram from February of this year shows EF to be 30%. Entresto being held due to acute kidney injury.  Digoxin level was 0.7.  History of PE On Xarelto prior to admission.  Patient was diagnosed with PE in May 2021.  He is completed more than 6 months of treatment.  Due to persistent alcohol use his risk of bleeding is significantly high.  When last seen by his cardiologist last month it was recommended that he stop his Xarelto.  This was discussed with the patient today.  We recommend that he discontinue treatment.  He is agreeable.  We will stop Xarelto going forward.  Transaminitis Mild elevation in AST and ALT noted.  Seen previously as well.  No significant abnormality in the hepatobiliary system except for the severe diffuse fatty infiltration noted in the liver.  LFTs have improved.  Continue to monitor closely.  History of alcohol abuse/mild alcohol  withdrawal Patient currently on CIWA protocol.  Last consumption of alcohol was about 24 hours prior to admission.  No clear evidence for alcohol withdrawal currently. Continue folic acid multivitamins and thiamine.  Essential hypertension Reasonably well controlled.  History of for depression and anxiety Continue home medications including Zoloft and Seroquel.  Diabetes mellitus type 2 At home  patient supposed to be on Farxiga, monitor CBGs.  HbA1c 5.6 when checked in February.  DVT Prophylaxis: SCDs Code Status: Full code Family Communication: Discussed with patient.  Mother is not at bedside today. Disposition Plan: Hopefully return home when improved  Status is: Inpatient  Remains inpatient appropriate because:IV treatments appropriate due to intensity of illness or inability to take PO and Inpatient level of care appropriate due to severity of illness   Dispo: The patient is from: Home              Anticipated d/c is to: Home              Patient currently is not medically stable to d/c.   Difficult to place patient No       Medications:  Scheduled: . carvedilol  3.125 mg Oral BID WC  . digoxin  0.125 mg Oral Daily  . folic acid  1 mg Oral Daily  . insulin aspart  0-9 Units Subcutaneous Q4H  . ivabradine  7.5 mg Oral BID WC  . LORazepam  0-4 mg Intravenous Q6H   Or  . LORazepam  0-4 mg Oral Q6H  . [START ON 07/31/2020] LORazepam  0-4 mg Intravenous Q12H   Or  . [START ON 07/31/2020] LORazepam  0-4 mg Oral Q12H  . multivitamin  1 tablet Oral Daily  . [START ON 08/01/2020] pantoprazole  40 mg Intravenous Q12H  . potassium chloride  20 mEq Oral Daily  . QUEtiapine  150 mg Oral Daily  . sucralfate  1 g Oral TID WC & HS  . thiamine  100 mg Oral Daily   Or  . thiamine  100 mg Intravenous Daily  . traZODone  50 mg Oral QHS   Continuous: . sodium chloride 125 mL/hr at 07/30/20 0525  . pantoprozole (PROTONIX) infusion 8 mg/hr (07/30/20 0526)  . pantoprazole (PROTONIX) 80 mg IVPB Stopped (07/29/20 0236)   HYQ:MVHQIONGEXBMW **OR** acetaminophen, chlordiazePOXIDE, morphine injection, ondansetron **OR** ondansetron (ZOFRAN) IV, phenol   Objective:  Vital Signs  Vitals:   07/29/20 1733 07/29/20 2148 07/30/20 0128 07/30/20 0402  BP: 133/84 121/80 (!) 127/93 127/86  Pulse: 99 81 78 89  Resp:  16 14 14   Temp: 98.6 F (37 C) 98.9 F (37.2 C) 98.8 F (37.1 C)  98.4 F (36.9 C)  TempSrc: Oral Oral Oral Oral  SpO2: 99% 100% 99% 98%  Weight:      Height:        Intake/Output Summary (Last 24 hours) at 07/30/2020 1024 Last data filed at 07/29/2020 1913 Gross per 24 hour  Intake 480 ml  Output --  Net 480 ml   Filed Weights   07/28/20 2125 07/29/20 1324  Weight: 86.2 kg 85.9 kg    General appearance: Awake alert.  In no distress Resp: Clear to auscultation bilaterally.  Normal effort Cardio: S1-S2 is normal regular.  No S3-S4.  No rubs murmurs or bruit GI: Abdomen is soft.  Mildly tender in the epigastric area without any rebound rigidity or guarding.  No masses organomegaly.   Extremities: No edema.  Full range of motion of lower extremities. Neurologic: Alert and  oriented x3.  No focal neurological deficits.     Lab Results:  Data Reviewed: I have personally reviewed following labs and imaging studies  CBC: Recent Labs  Lab 07/28/20 2141 07/29/20 0131 07/29/20 0458 07/29/20 0731 07/29/20 1317 07/29/20 1854 07/30/20 0519  WBC 8.1  --  8.7  --   --   --  5.3  HGB 14.8   < > 13.4 12.2* 11.4* 10.6* 10.4*  HCT 43.9   < > 41.1 37.0* 34.5* 32.6* 32.3*  MCV 90.7  --  91.9  --   --   --  94.2  PLT 388  --  326  --   --   --  179   < > = values in this interval not displayed.    Basic Metabolic Panel: Recent Labs  Lab 07/28/20 2141 07/29/20 0458 07/30/20 0519  NA 138 139 138  K 4.3 4.3 3.7  CL 91* 91* 101  CO2 18* 24 25  GLUCOSE 155* 153* 87  BUN 21* 28* 30*  CREATININE 2.25* 2.78* 1.75*  CALCIUM 10.2 9.3 8.2*    GFR: Estimated Creatinine Clearance: 59.1 mL/min (A) (by C-G formula based on SCr of 1.75 mg/dL (H)).  Liver Function Tests: Recent Labs  Lab 07/28/20 2141 07/29/20 0458 07/30/20 0519  AST 60* 44* 36  ALT 77* 65* 42  ALKPHOS 90 80 54  BILITOT 1.4* 1.9* 1.4*  PROT 10.1* 9.3* 6.8  ALBUMIN 5.6* 5.2* 3.8    Recent Labs  Lab 07/28/20 2141  LIPASE 26    Coagulation Profile: Recent Labs  Lab  07/29/20 0458  INR 1.4*    Cardiac Enzymes: Recent Labs  Lab 07/28/20 2200  CKTOTAL 189     Radiology Studies: CT Abdomen Pelvis Wo Contrast  Result Date: 07/29/2020 CLINICAL DATA:  Left side chest and upper abdominal pain. EXAM: CT ABDOMEN AND PELVIS WITHOUT CONTRAST TECHNIQUE: Multidetector CT imaging of the abdomen and pelvis was performed following the standard protocol without IV contrast. COMPARISON:  None. FINDINGS: Lower chest: Thin walled cyst noted in the left lower lobe with air-fluid level. Adjacent scarring. No effusions. Heart is normal size. Hepatobiliary: Severe diffuse low-density throughout the liver compatible with fatty infiltration. Gallbladder unremarkable. Pancreas: No focal abnormality or ductal dilatation. Spleen: No focal abnormality.  Normal size. Adrenals/Urinary Tract: No adrenal abnormality. No focal renal abnormality. No stones or hydronephrosis. Urinary bladder is unremarkable. Stomach/Bowel: Normal appendix. Diffuse low-density throughout the colonic wall may reflect fatty proliferation from prior/remote inflammatory bowel disease. No current wall thickening. Stomach and small bowel decompressed, grossly unremarkable. No bowel obstruction. Vascular/Lymphatic: No evidence of aneurysm or adenopathy. Reproductive: No visible focal abnormality. Other: No free fluid or free air. Musculoskeletal: No acute bony abnormality. IMPRESSION: Severe diffuse fatty infiltration of the liver. Thin walled air-filled cyst in the left lower lobe posteriorly with small air-fluid level. Is difficult to determine if this represents bronchiectasis or other cause of thin walled cyst. Chest CT may be helpful for further evaluation. No acute findings in the abdomen or pelvis. Electronically Signed   By: Rolm Baptise M.D.   On: 07/29/2020 00:00   DG Chest Port 1 View  Result Date: 07/28/2020 CLINICAL DATA:  Chest pain EXAM: PORTABLE CHEST 1 VIEW COMPARISON:  07/10/2020 FINDINGS: Heart and  mediastinal contours are within normal limits. No focal opacities or effusions. No acute bony abnormality. IMPRESSION: No active disease. Electronically Signed   By: Rolm Baptise M.D.   On: 07/28/2020 21:59  LOS: 1 day   Jadiel Schmieder [employer] on [email or web address]  07/30/2020, 10:24 AM

## 2020-07-30 NOTE — Progress Notes (Signed)
Daily Rounding Note  07/30/2020, 1:28 PM  LOS: 1 day   SUBJECTIVE:   Chief complaint: FOBT positive nausea and vomiting.  Alcohol abuse. Nausea or vomiting.  Tolerating solid food and eating 100% of his meals.  Feels ready for discharge home.  Stool is brown.  OBJECTIVE:         Vital signs in last 24 hours:    Temp:  [98.4 F (36.9 C)-98.9 F (37.2 C)] 98.4 F (36.9 C) (03/20 0402) Pulse Rate:  [78-99] 89 (03/20 0402) Resp:  [14-16] 14 (03/20 0402) BP: (121-133)/(80-93) 127/86 (03/20 0402) SpO2:  [98 %-100 %] 98 % (03/20 0402)   Filed Weights   07/28/20 2125 07/29/20 1324  Weight: 86.2 kg 85.9 kg   General: Looks better but still looks somewhat unwell.  Not toxic.  Comfortable.  Alert Heart: RRR. Chest: No labored breathing or cough. Abdomen: Soft.  Not tender or distended. Extremities: No CCE. Neuro/Psych: Alert.  Appropriate.  Affect is a bit flat and disengaged.  Oriented x3.  No tremors, no weakness.  Intake/Output from previous day: 03/19 0701 - 03/20 0700 In: 480 [P.O.:480] Out: -   Intake/Output this shift: No intake/output data recorded.  Lab Results: Recent Labs    07/28/20 2141 07/29/20 0131 07/29/20 0458 07/29/20 0731 07/29/20 1317 07/29/20 1854 07/30/20 0519  WBC 8.1  --  8.7  --   --   --  5.3  HGB 14.8   < > 13.4   < > 11.4* 10.6* 10.4*  HCT 43.9   < > 41.1   < > 34.5* 32.6* 32.3*  PLT 388  --  326  --   --   --  179   < > = values in this interval not displayed.   BMET Recent Labs    07/28/20 2141 07/29/20 0458 07/30/20 0519  NA 138 139 138  K 4.3 4.3 3.7  CL 91* 91* 101  CO2 18* 24 25  GLUCOSE 155* 153* 87  BUN 21* 28* 30*  CREATININE 2.25* 2.78* 1.75*  CALCIUM 10.2 9.3 8.2*   LFT Recent Labs    07/28/20 2141 07/29/20 0458 07/30/20 0519  PROT 10.1* 9.3* 6.8  ALBUMIN 5.6* 5.2* 3.8  AST 60* 44* 36  ALT 77* 65* 42  ALKPHOS 90 80 54  BILITOT 1.4* 1.9* 1.4*    PT/INR Recent Labs    07/29/20 0458  LABPROT 16.9*  INR 1.4*   Hepatitis Panel No results for input(s): HEPBSAG, HCVAB, HEPAIGM, HEPBIGM in the last 72 hours.  Studies/Results: CT Abdomen Pelvis Wo Contrast  Result Date: 07/29/2020 CLINICAL DATA:  Left side chest and upper abdominal pain. EXAM: CT ABDOMEN AND PELVIS WITHOUT CONTRAST TECHNIQUE: Multidetector CT imaging of the abdomen and pelvis was performed following the standard protocol without IV contrast. COMPARISON:  None. FINDINGS: Lower chest: Thin walled cyst noted in the left lower lobe with air-fluid level. Adjacent scarring. No effusions. Heart is normal size. Hepatobiliary: Severe diffuse low-density throughout the liver compatible with fatty infiltration. Gallbladder unremarkable. Pancreas: No focal abnormality or ductal dilatation. Spleen: No focal abnormality.  Normal size. Adrenals/Urinary Tract: No adrenal abnormality. No focal renal abnormality. No stones or hydronephrosis. Urinary bladder is unremarkable. Stomach/Bowel: Normal appendix. Diffuse low-density throughout the colonic wall may reflect fatty proliferation from prior/remote inflammatory bowel disease. No current wall thickening. Stomach and small bowel decompressed, grossly unremarkable. No bowel obstruction. Vascular/Lymphatic: No evidence of aneurysm or adenopathy. Reproductive: No visible  focal abnormality. Other: No free fluid or free air. Musculoskeletal: No acute bony abnormality. IMPRESSION: Severe diffuse fatty infiltration of the liver. Thin walled air-filled cyst in the left lower lobe posteriorly with small air-fluid level. Is difficult to determine if this represents bronchiectasis or other cause of thin walled cyst. Chest CT may be helpful for further evaluation. No acute findings in the abdomen or pelvis. Electronically Signed   By: Rolm Baptise M.D.   On: 07/29/2020 00:00   DG Chest Port 1 View  Result Date: 07/28/2020 CLINICAL DATA:  Chest pain EXAM:  PORTABLE CHEST 1 VIEW COMPARISON:  07/10/2020 FINDINGS: Heart and mediastinal contours are within normal limits. No focal opacities or effusions. No acute bony abnormality. IMPRESSION: No active disease. Electronically Signed   By: Rolm Baptise M.D.   On: 07/28/2020 21:59   Scheduled Meds: . carvedilol  3.125 mg Oral BID WC  . digoxin  0.125 mg Oral Daily  . folic acid  1 mg Oral Daily  . insulin aspart  0-9 Units Subcutaneous Q4H  . ivabradine  7.5 mg Oral BID WC  . LORazepam  0-4 mg Intravenous Q6H   Or  . LORazepam  0-4 mg Oral Q6H  . [START ON 07/31/2020] LORazepam  0-4 mg Intravenous Q12H   Or  . [START ON 07/31/2020] LORazepam  0-4 mg Oral Q12H  . multivitamin  1 tablet Oral Daily  . [START ON 08/01/2020] pantoprazole  40 mg Intravenous Q12H  . potassium chloride  20 mEq Oral Daily  . QUEtiapine  150 mg Oral Daily  . sucralfate  1 g Oral TID WC & HS  . thiamine  100 mg Oral Daily   Or  . thiamine  100 mg Intravenous Daily  . traZODone  50 mg Oral QHS   Continuous Infusions: . sodium chloride 100 mL/hr at 07/30/20 1115  . pantoprozole (PROTONIX) infusion 8 mg/hr (07/30/20 0526)  . pantoprazole (PROTONIX) 80 mg IVPB Stopped (07/29/20 0236)   PRN Meds:.acetaminophen **OR** acetaminophen, chlordiazePOXIDE, morphine injection, ondansetron **OR** ondansetron (ZOFRAN) IV, phenol   ASSESMENT:   *   Recurrent bouts of acute on chronic nausea and vomiting. This despite Protonix and more recently Carafate. Gastroduodenitis w GIB in 10/2019.  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  *    alcohol abuse, not in remission.  *    fatty liver.  Mild elevation AST/ALT resolved.  *    nonischemic cardiomyopathy.  CHF.  EF 15%.  *   Chronic Xarelto, PE but no DVT in 09/2019.  Per Dr. Ron Parker note of 2/28 because the PE was small Dr. Haroldine Laws felt the Eliquis could be discontinued but patient did not discontinue this. His last dose of Xarelto was a few days ago.  *     AKI, improved but still w GFR at 50 up from 29     PLAN   *   EGD had been planned for today but was canceled With the resolution of his GI symptoms not clear he needs to have the EGD and he feels he does not need it.  He wants to go home. Will not pursue EGD.  From GI view, ok to go home today.    *   Patient's biggest issue is his chronic alcoholism which has caused multisystem problems.  *  Protonix 40 mg po bid for 1 month, then drop to 1 x daily.  carafate 1 gram bid for 2 weeks  *   No plans  for GI office follow up. Encouraged pt to re-engage w AA and get a sponsor to help him achieve sustained sobriety.     Azucena Freed  07/30/2020, 1:28 PM Phone [phone]

## 2020-07-31 ENCOUNTER — Encounter (HOSPITAL_COMMUNITY): Payer: Self-pay

## 2020-07-31 ENCOUNTER — Ambulatory Visit (HOSPITAL_COMMUNITY)
Admission: RE | Admit: 2020-07-31 | Discharge: 2020-07-31 | Disposition: A | Discharge status: Home / Self Care | Payer: BC Managed Care – PPO | Source: Ambulatory Visit | Attending: Adult Health | Admitting: Adult Health

## 2020-07-31 ENCOUNTER — Other Ambulatory Visit (HOSPITAL_COMMUNITY): Payer: Self-pay

## 2020-07-31 ENCOUNTER — Telehealth: Payer: Self-pay

## 2020-07-31 ENCOUNTER — Telehealth (HOSPITAL_COMMUNITY): Payer: Self-pay | Admitting: Licensed Clinical Social Worker

## 2020-07-31 ENCOUNTER — Encounter (HOSPITAL_COMMUNITY): Payer: Self-pay | Admitting: Internal Medicine

## 2020-07-31 ENCOUNTER — Other Ambulatory Visit: Payer: Self-pay

## 2020-07-31 VITALS — BP 150/100 | HR 85 | Wt 197.0 lb

## 2020-07-31 DIAGNOSIS — Z79899 Other long term (current) drug therapy: Secondary | ICD-10-CM | POA: Insufficient documentation

## 2020-07-31 DIAGNOSIS — F17210 Nicotine dependence, cigarettes, uncomplicated: Secondary | ICD-10-CM | POA: Insufficient documentation

## 2020-07-31 DIAGNOSIS — I5022 Chronic systolic (congestive) heart failure: Secondary | ICD-10-CM

## 2020-07-31 DIAGNOSIS — F1010 Alcohol abuse, uncomplicated: Secondary | ICD-10-CM | POA: Diagnosis not present

## 2020-07-31 DIAGNOSIS — Z8249 Family history of ischemic heart disease and other diseases of the circulatory system: Secondary | ICD-10-CM | POA: Insufficient documentation

## 2020-07-31 DIAGNOSIS — I10 Essential (primary) hypertension: Secondary | ICD-10-CM | POA: Diagnosis not present

## 2020-07-31 DIAGNOSIS — Z7901 Long term (current) use of anticoagulants: Secondary | ICD-10-CM | POA: Insufficient documentation

## 2020-07-31 DIAGNOSIS — Z8719 Personal history of other diseases of the digestive system: Secondary | ICD-10-CM | POA: Insufficient documentation

## 2020-07-31 DIAGNOSIS — I2699 Other pulmonary embolism without acute cor pulmonale: Secondary | ICD-10-CM | POA: Insufficient documentation

## 2020-07-31 DIAGNOSIS — Z720 Tobacco use: Secondary | ICD-10-CM | POA: Diagnosis not present

## 2020-07-31 DIAGNOSIS — I313 Pericardial effusion (noninflammatory): Secondary | ICD-10-CM | POA: Insufficient documentation

## 2020-07-31 DIAGNOSIS — F10129 Alcohol abuse with intoxication, unspecified: Secondary | ICD-10-CM | POA: Insufficient documentation

## 2020-07-31 DIAGNOSIS — I428 Other cardiomyopathies: Secondary | ICD-10-CM | POA: Insufficient documentation

## 2020-07-31 LAB — CBC
HCT: 31.5 % — ABNORMAL LOW (ref 39.0–52.0)
Hemoglobin: 10.2 g/dL — ABNORMAL LOW (ref 13.0–17.0)
MCH: 30.9 pg (ref 26.0–34.0)
MCHC: 32.4 g/dL (ref 30.0–36.0)
MCV: 95.5 fL (ref 80.0–100.0)
Platelets: 157 10*3/uL (ref 150–400)
RBC: 3.3 MIL/uL — ABNORMAL LOW (ref 4.22–5.81)
RDW: 13.8 % (ref 11.5–15.5)
WBC: 4.1 10*3/uL (ref 4.0–10.5)
nRBC: 0 % (ref 0.0–0.2)

## 2020-07-31 LAB — COMPREHENSIVE METABOLIC PANEL
ALT: 40 U/L (ref 0–44)
AST: 37 U/L (ref 15–41)
Albumin: 3.7 g/dL (ref 3.5–5.0)
Alkaline Phosphatase: 56 U/L (ref 38–126)
Anion gap: 8 (ref 5–15)
BUN: 20 mg/dL (ref 6–20)
CO2: 25 mmol/L (ref 22–32)
Calcium: 8.3 mg/dL — ABNORMAL LOW (ref 8.9–10.3)
Chloride: 103 mmol/L (ref 98–111)
Creatinine, Ser: 1.2 mg/dL (ref 0.61–1.24)
GFR, Estimated: >60 mL/min (ref >60)
Glucose, Bld: 97 mg/dL (ref 70–99)
Potassium: 3.6 mmol/L (ref 3.5–5.1)
Sodium: 136 mmol/L (ref 135–145)
Total Bilirubin: 0.9 mg/dL (ref 0.3–1.2)
Total Protein: 6.6 g/dL (ref 6.5–8.1)

## 2020-07-31 LAB — GLUCOSE, CAPILLARY
Glucose-Capillary: 83 mg/dL (ref 70–99)
Glucose-Capillary: 110 mg/dL — ABNORMAL HIGH (ref 70–99)

## 2020-07-31 SURGERY — ESOPHAGOGASTRODUODENOSCOPY (EGD) WITH PROPOFOL
Anesthesia: Monitor Anesthesia Care

## 2020-07-31 MED ORDER — SUCRALFATE 1 G PO TABS: 1.0000 g | ORAL_TABLET | Freq: Three times a day (TID) | ORAL | 0 refills | Status: DC

## 2020-07-31 MED ORDER — THIAMINE HCL 100 MG PO TABS: 100.0000 mg | ORAL_TABLET | Freq: Every day | ORAL | 0 refills | Status: DC

## 2020-07-31 MED ORDER — PANTOPRAZOLE SODIUM 40 MG PO TBEC: 40.0000 mg | DELAYED_RELEASE_TABLET | Freq: Two times a day (BID) | ORAL | 1 refills | Status: DC

## 2020-07-31 NOTE — Patient Instructions (Addendum)
No Labs done today.  No medication changes were made. Please continue all current medications as prescribed.  Your physician recommends that you schedule a follow-up appointment on Monday for a lab only appointment, 2 weeks with [facility] and in 4 weeks with our Pharmacy Clinic.    If you have any questions or concerns before your next appointment please send [place] a message through [place] or call our office at [phone].    TO LEAVE A MESSAGE FOR THE NURSE SELECT OPTION 2, PLEASE LEAVE A MESSAGE INCLUDING: . YOUR NAME . DATE OF BIRTH . CALL BACK NUMBER . REASON FOR CALL**this is important as we prioritize the call backs  YOU WILL RECEIVE A CALL BACK THE SAME DAY AS LONG AS YOU CALL BEFORE 4:00 PM   Do the following things EVERYDAY: 1) Weigh yourself in the morning before breakfast. Write it down and keep it in a log. 2) Take your medicines as prescribed 3) Eat low salt foods--Limit salt (sodium) to 2000 mg per day.  4) Stay as active as you can everyday 5) Limit all fluids for the day to less than 2 liters   At the [facility], you and your health needs are our priority. As part of our continuing mission to provide you with exceptional heart care, we have created designated Provider Care Teams. These Care Teams include your primary Cardiologist (physician) and Advanced Practice Providers (APPs- Physician Assistants and Nurse Practitioners) who all work together to provide you with the care you need, when you need it.   You may see any of the following providers on your designated Care Team at your next follow up: [name] Dr Glori Bickers . Dr Loralie Champagne . Darrick Grinder, NP . Lyda Jester, PA . Audry Riles, PharmD   Please be sure to bring in all your medications bottles to every appointment.

## 2020-07-31 NOTE — Telephone Encounter (Signed)
  Patient was recently discharged from the hospital on 07/31/20.  No TCM completed, patient does not qualify for TCM services due to [employer] coverage.  Per discharge summary patient needs follow up with PCP. No HFU scheduled at this time. FYI!

## 2020-07-31 NOTE — Telephone Encounter (Signed)
Admin team, please call pt to schedule Hosp f/u with [facility]

## 2020-07-31 NOTE — Progress Notes (Signed)
Paramedicine Encounter    Patient ID: Robert [name], male    DOB: [birth year], 40 y.o.   MRN: [id number]  Met with Robert [name] in clinic today. Robert [name] was recently discharged from hospital after an admission with GI related symptoms. Robert [name] reports feeling much better today after discharge. Robert Grinder, PA saw Robert [name] in clinic today and discussed with Robert [name] the importance of med compliance and scheduling and keeping appointments. Robert [name] was also informed that if he does not comply with these things he will be discharged from paramedicine. Robert [name] asked if we could continue following Robert [name] to get him familiar with his medications, how and when to take them with filling his own pill box and a list of his providers with contacts for him to schedule or reach out for appointments. I will provide Robert [name] with same and follow up once he has all his medications in the home. Robert [name] is scheduled for lab visit on Monday 3/28 I will meet him here to review medications and fill pillbox. Robert [name] and Robert [name] are discussing options for inpatient rehab for his alcohol use. Robert [name] is agreeable pending expense to be admitted into an inpatient facility. We will continue to follow for same. Clinic visit complete.   Refills: (Robert [name] picked up the following today at [facility])  -Spironolactone -Carvedilol -Digoxin -Librium  -Entresto      ACTION: Home visit completed Next visit planned for one week

## 2020-07-31 NOTE — Discharge Summary (Signed)
[facility]  Physician Discharge Summary   Patient ID: Robert [name] MRN: [id number] DOB/AGE: Nov 11, 1980 40 y.o.  Admit date: 07/28/2020 Discharge date: 08/01/2020  PCP: Flossie Buffy, NP  DISCHARGE DIAGNOSES:  Nausea vomiting likely due to alcohol use and alcoholic gastritis Mild upper GI bleed likely due to mild Mallory-Weiss tear Acute kidney injury, improving Normocytic anemia Chronic systolic CHF History of PE, anticoagulation discontinued Transaminitis History of alcohol abuse Essential hypertension Diabetes mellitus type 2 History of depression and anxiety   RECOMMENDATIONS FOR OUTPATIENT FOLLOW UP: 1. Anticoagulation has been discontinued.     Home Health: None Equipment/Devices: None  CODE STATUS: Full code  DISCHARGE CONDITION: fair  Diet recommendation: Modified carbohydrate/heart healthy  INITIAL HISTORY: 40 y.o.African-American malewith medical history significant foralcohol abuse, asthma and chronic systolic CHF, presented to emergency room with acute onset of recurrent nausea and vomiting with associated lower abdominal pain as well as epigastric and substernal chest pain felt as heartburn.   Patient reported black-colored stools as well as some blood-tinged emesis.  Patient was found to have heme positive stool.  He was hospitalized for further management.    Consultations:  Gastroenterology  Procedures:  None   HOSPITAL COURSE:   Nausea vomiting/concern for GI bleeding Symptoms probably due to his alcohol use and gastritis.    Patient was placed on PPI.  Seen by gastroenterology.  Initially there was plans to do upper endoscopy however patient's symptoms rapidly improved.  Hemoglobin did go down but did not drop significantly.  Drop was considered to be dilutional.  Patient could have had a small Mallory-Weiss tear from his nausea and vomiting.  He was not keen on undergoing endoscopy.  Gastroenterology also felt that  endoscopy was likely not necessary.     Acute kidney injury Baseline creatinine around 1.2-1.5.  Presented with creatinine of 2.25.  Worsened to 2.79.  Rate of IV fluids was increased.  Creatinine is now back to his baseline.  Normocytic anemia/mild acute blood loss anemia Drop in hemoglobin likely dilutional along with some element of acute blood loss.  Stable for the most part.  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  Patient does not have any respiratory symptoms.    Initially a CT scan was being considered and we will waiting for his creatinine to get better.  However subsequently previous films were reviewed.  CTA from May 2021 revealed a small pneumatocele in the same area. Likely same issue. Will defer to OP providers. No indication for CT chest at this time considering no respiratory symptoms.   Chronic systolic CHF Followed by cardiology.  Patient denies being on diuretics.  He has noted to be on digoxin, ivabradine and carvedilol.  Echocardiogram from February of this year shows EF to be 30%.  [name] was held in the hospital due to his acute kidney injury.  Digoxin level was 0.7.  Now that his renal function is back to baseline and Entresto can be resumed.  Patient has an appointment with the heart failure clinic later today.    History of PE Was on Xarelto prior to admission.  Patient was diagnosed with PE in May 2021.  He is completed more than 6 months of treatment.  Due to persistent alcohol use his risk of bleeding is significantly high.  When last seen by his cardiologist last month it was recommended that he stop his Xarelto.  This was discussed with  the patient today.  We recommend that he discontinue treatment.  He is agreeable.  We will stop Xarelto going forward.  Transaminitis Mild elevation in AST and ALT noted.  Seen previously as well.  No significant abnormality in the hepatobiliary system  except for the severe diffuse fatty infiltration noted in the liver.  LFTs have improved.    History of alcohol abuse/mild alcohol withdrawal Maintain on CIWA protocol here in the hospital.  Did not have any significant withdrawal symptoms.  Essential hypertension Reasonably well controlled.  History of for depression and anxiety Continue home medications including Zoloft and Seroquel.  Diabetes mellitus type 2 HbA1c 5.6 when checked in February.  Patient is stable.  Wants to go home today.  Okay for discharge.   PERTINENT LABS:  The results of significant diagnostics from this hospitalization (including imaging, microbiology, ancillary and laboratory) are listed below for reference.    Microbiology: Recent Results (from the past 240 hour(s))  SARS CORONAVIRUS 2 (TAT 6-24 HRS) Nasopharyngeal Nasopharyngeal Swab     Status: None   Collection Time: 07/29/20 12:17 AM   Specimen: Nasopharyngeal Swab  Result Value Ref Range Status   SARS Coronavirus 2 NEGATIVE NEGATIVE Final    Comment: (NOTE) SARS-CoV-2 target nucleic acids are NOT DETECTED.  The SARS-CoV-2 RNA is generally detectable in upper and lower respiratory specimens during the acute phase of infection. Negative results do not preclude SARS-CoV-2 infection, do not rule out co-infections with other pathogens, and should not be used as the sole basis for treatment or other patient management decisions. Negative results must be combined with clinical observations, patient history, and epidemiological information. The expected result is Negative.  Fact Sheet for Patients: SugarRoll.be  Fact Sheet for Healthcare Providers: https://www.woods-mathews.com/  This test is not yet approved or cleared by the [place] FDA and  has been authorized for detection and/or diagnosis of SARS-CoV-2 by FDA under an Emergency Use Authorization (EUA). This EUA will remain  in effect  (meaning this test can be used) for the duration of the COVID-19 declaration under Se ction 564(b)(1) of the Act, 21 U.S.C. section 360bbb-3(b)(1), unless the authorization is terminated or revoked sooner.  Performed at [facility], [place] [address] [place], [place] [address]      Labs:  COVID-19 Labs  No results for input(s): DDIMER, FERRITIN, LDH, CRP in the last 72 hours.  Lab Results  Component Value Date   SARSCOV2NAA NEGATIVE 07/29/2020   [place] NEGATIVE 07/10/2020   [place] Not Detected 04/27/2020   [place] NEGATIVE 03/23/2020      Basic Metabolic Panel: Recent Labs  Lab 07/28/20 2141 07/29/20 0458 07/30/20 0519 07/31/20 0553  NA 138 139 138 136  K 4.3 4.3 3.7 3.6  CL 91* 91* 101 103  CO2 18* 24 25 25   GLUCOSE 155* 153* 87 97  BUN 21* 28* 30* 20  CREATININE 2.25* 2.78* 1.75* 1.20  CALCIUM 10.2 9.3 8.2* 8.3*   Liver Function Tests: Recent Labs  Lab 07/28/20 2141 07/29/20 0458 07/30/20 0519 07/31/20 0553  AST 60* 44* 36 37  ALT 77* 65* 42 40  ALKPHOS 90 80 54 56  BILITOT 1.4* 1.9* 1.4* 0.9  PROT 10.1* 9.3* 6.8 6.6  ALBUMIN 5.6* 5.2* 3.8 3.7   Recent Labs  Lab 07/28/20 2141  LIPASE 26   CBC: Recent Labs  Lab 07/28/20 2141 07/29/20 0131 07/29/20 0458 07/29/20 0731 07/29/20 1317 07/29/20 1854 07/30/20 0519 07/31/20 0553  WBC 8.1  --  8.7  --   --   --  5.3 4.1  HGB 14.8   < > 13.4 12.2* 11.4* 10.6* 10.4* 10.2*  HCT 43.9   < > 41.1 37.0* 34.5* 32.6* 32.3* 31.5*  MCV 90.7  --  91.9  --   --   --  94.2 95.5  PLT 388  --  326  --   --   --  179 157   < > = values in this interval not displayed.   Cardiac Enzymes: Recent Labs  Lab 07/28/20 2200  CKTOTAL 189   BNP: BNP (last 3 results) Recent Labs    12/30/19 1016 03/23/20 0513 07/28/20 2141  BNP 262.2* 87.4 72.2    CBG: Recent Labs  Lab 07/30/20 1622 07/30/20 2008 07/30/20 2338 07/31/20 0354 07/31/20 0746  GLUCAP 109* 102* 109* 83 110*      IMAGING STUDIES CT Abdomen Pelvis Wo Contrast  Result Date: 07/29/2020 CLINICAL DATA:  Left side chest and upper abdominal pain. EXAM: CT ABDOMEN AND PELVIS WITHOUT CONTRAST TECHNIQUE: Multidetector CT imaging of the abdomen and pelvis was performed following the standard protocol without IV contrast. COMPARISON:  None. FINDINGS: Lower chest: Thin walled cyst noted in the left lower lobe with air-fluid level. Adjacent scarring. No effusions. Heart is normal size. Hepatobiliary: Severe diffuse low-density throughout the liver compatible with fatty infiltration. Gallbladder unremarkable. Pancreas: No focal abnormality or ductal dilatation. Spleen: No focal abnormality.  Normal size. Adrenals/Urinary Tract: No adrenal abnormality. No focal renal abnormality. No stones or hydronephrosis. Urinary bladder is unremarkable. Stomach/Bowel: Normal appendix. Diffuse low-density throughout the colonic wall may reflect fatty proliferation from prior/remote inflammatory bowel disease. No current wall thickening. Stomach and small bowel decompressed, grossly unremarkable. No bowel obstruction. Vascular/Lymphatic: No evidence of aneurysm or adenopathy. Reproductive: No visible focal abnormality. Other: No free fluid or free air. Musculoskeletal: No acute bony abnormality. IMPRESSION: Severe diffuse fatty infiltration of the liver. Thin walled air-filled cyst in the left lower lobe posteriorly with small air-fluid level. Is difficult to determine if this represents bronchiectasis or other cause of thin walled cyst. Chest CT may be helpful for further evaluation. No acute findings in the abdomen or pelvis. Electronically Signed   By: Rolm Baptise M.D.   On: 07/29/2020 00:00    DG Chest Port 1 View  Result Date: 07/28/2020 CLINICAL DATA:  Chest pain EXAM: PORTABLE CHEST 1 VIEW COMPARISON:  07/10/2020 FINDINGS: Heart and mediastinal contours are within normal limits. No focal opacities or effusions. No acute bony  abnormality. IMPRESSION: No active disease. Electronically Signed   By: Rolm Baptise M.D.   On: 07/28/2020 21:59     DISCHARGE EXAMINATION: Vitals:   07/30/20 0402 07/30/20 1433 07/30/20 2011 07/31/20 0353  BP: 127/86 128/83 (!) 144/89 (!) 142/98  Pulse: 89 74 68 69  Resp: 14 18 16 14   Temp: 98.4 F (36.9 C) 98.5 F (36.9 C) 99.3 F (37.4 C) 98.4 F (36.9 C)  TempSrc: Oral Oral Oral Oral  SpO2: 98% 97% 99% 98%  Weight:      Height:       General appearance: Awake alert.  In no distress Resp: Clear to auscultation bilaterally.  Normal effort Cardio: S1-S2 is normal regular.  No S3-S4.  No rubs murmurs or bruit GI: Abdomen is soft.  Nontender nondistended.  Bowel sounds are present normal.  No masses organomegaly    DISPOSITION: Home  Discharge Instructions    Call MD for:  difficulty breathing, headache or visual disturbances   Complete by: As directed  Call MD for:  extreme fatigue   Complete by: As directed    Call MD for:  persistant dizziness or light-headedness   Complete by: As directed    Call MD for:  persistant nausea and vomiting   Complete by: As directed    Call MD for:  severe uncontrolled pain   Complete by: As directed    Call MD for:  temperature >100.4   Complete by: As directed    Diet - low sodium heart healthy   Complete by: As directed    Discharge instructions   Complete by: As directed    Please seek attention immediately if you note worsening shortness of breath or chest pains, feel dizzy or lightheaded. Stop consuming any form of alcohol.  You were cared for by a hospitalist during your hospital stay. If you have any questions about your discharge medications or the care you received while you were in the hospital after you are discharged, you can call the unit and asked to speak with the hospitalist on call if the hospitalist that took care of you is not available. Once you are discharged, your primary care physician will handle any further  medical issues. Please note that NO REFILLS for any discharge medications will be authorized once you are discharged, as it is imperative that you return to your primary care physician (or establish a relationship with a primary care physician if you do not have one) for your aftercare needs so that they can reassess your need for medications and monitor your lab values. If you do not have a primary care physician, you can call [phone] for a physician referral.   Increase activity slowly   Complete by: As directed         Allergies as of 07/31/2020      Reactions   Bactrim [sulfamethoxazole-trimethoprim] Rash      Medication List    STOP taking these medications   rivaroxaban 20 MG Tabs tablet Commonly known as: XARELTO   sertraline 25 MG tablet Commonly known as: Zoloft     TAKE these medications   carvedilol 3.125 MG tablet Commonly known as: Coreg Take 1 tablet (3.125 mg total) by mouth 2 (two) times daily.   chlordiazePOXIDE 5 MG capsule Commonly known as: LIBRIUM Take 1 capsule (5 mg total) by mouth 3 (three) times daily as needed for anxiety.   dapagliflozin propanediol 10 MG Tabs tablet Commonly known as: Farxiga Take 1 tablet (10 mg total) by mouth daily before breakfast. What changed: when to take this   digoxin 0.125 MG tablet Commonly known as: LANOXIN Take 1 tablet (0.125 mg total) by mouth daily.   Entresto 97-103 MG Generic drug: sacubitril-valsartan Take 1 tablet by mouth 2 (two) times daily.   folic acid 1 MG tablet Commonly known as: FOLVITE Take 1 tablet (1 mg total) by mouth daily.   ivabradine 7.5 MG Tabs tablet Commonly known as: CORLANOR Take 1 tablet (7.5 mg total) by mouth 2 (two) times daily with a meal.   ondansetron 4 MG disintegrating tablet Commonly known as: Zofran ODT Take 1 tablet (4 mg total) by mouth every 8 (eight) hours as needed for nausea or vomiting.   pantoprazole 40 MG tablet Commonly known as: PROTONIX Take 1 tablet  (40 mg total) by mouth 2 (two) times daily. What changed: when to take this   potassium chloride 20 MEQ packet Commonly known as: KLOR-CON Take 20 mEq by mouth daily.   QUEtiapine 100 MG  tablet Commonly known as: SEROQUEL Take 1.5 tablets (150 mg total) by mouth daily.   spironolactone 25 MG tablet Commonly known as: ALDACTONE Take 1 tablet (25 mg total) by mouth daily. HOLD until 03/26/20   sucralfate 1 g tablet Commonly known as: Carafate Take 1 tablet (1 g total) by mouth 4 (four) times daily -  with meals and at bedtime.   thiamine 100 MG tablet Take 1 tablet (100 mg total) by mouth daily.   traZODone 50 MG tablet Commonly known as: DESYREL Take 50 mg by mouth at bedtime.         Follow-up Information    Nche, Charlene Brooke, NP. Schedule an appointment as soon as possible for a visit in 1 week(s).   Specialty: Internal Medicine Contact information: [place] [address] [phone]               TOTAL DISCHARGE TIME: 71 minutes  [place]  [facility] Pager on [email or web address]  08/01/2020, 12:30 PM

## 2020-07-31 NOTE — Progress Notes (Signed)
Heart and Vascular Care Navigation  07/31/2020  Robert [name] 09/07/1980 [id number]  Reason for Referral: Pt called CSW to request help with [name] cost concerns, finding a dentist, and looking into inpatient alcohol rehab.                                                                                                    Assessment:  Pt reports that his [name] has been $0 and when he called his pharmacy about it they are telling him it is now over $100.  Clinical biochemist, Control and instrumentation engineer, assisted in  obtaining pt a $10 copay card- pt informed.  CSW then able to look up local in network dentist through pt dental provider website ([facility])- emailed to pt.  Pt considering going back to inpatient rehab with [facility] but is worried about the cost.  CSW had pt call [facility] to speak to their referrals department about looking into cost.  Pt informed it would be $4,400 total and he would have to pay $500 up front- he is discussing this with his mom.  Pt requested CSW send list of other inpatient facilities- CSW sent list to pt email for review.                                       HRT/VAS Care Coordination    Patients Home Cardiology Office Heart Failure Clinic   Outpatient Care Team Social Worker   Social Worker Name: Raquel Sarna, [place] [phone]   Living arrangements for the past 2 months Single Family Home   Lives with: Parents   Patient Current Insurance Coverage Self-Pay  Patient is trying to reestablish insurance through employer   Patient Has Concern With [place] Yes   Does Patient Have Prescription Coverage? No   Patient Prescription Assistance Programs Heart Failure Fund; Patient Assistance Programs   Patient Assistance Programs Medications Xarelto and [place] Devices/Equipment None   DME Agency NA   Current home services DME  has a scale , bp cuff      Social History:                                                                              SDOH Screenings   Alcohol Screen: Medium Risk  . Last Alcohol Screening Score (AUDIT): 14  Depression (PHQ2-9): Medium Risk  . PHQ-2 Score: 16  Financial Resource Strain: High Risk  . Difficulty of Paying Living Expenses: Very hard  Food Insecurity: Food Insecurity Present  . Worried About Charity fundraiser in the Last Year: Sometimes true  . Ran Out of Food in the Last Year: Sometimes true  Housing: Low Risk   .  Last Housing Risk Score: 0  Physical Activity: Not on file  Social Connections: Not on file  Stress: Not on file  Tobacco Use: High Risk  . Smoking Tobacco Use: Current Every Day Smoker  . Smokeless Tobacco Use: Never Used  Transportation Needs: Not on file  Other Care Navigation Interventions:     Inpatient/Outpatient Substance Abuse Counseling/Rehab Options Pt sent list of inpatient facilities and is speaking with [facility] regarding another rehab stay.  Provided Pharmacy assistance resources  entresto copay card provided    Follow-up plan:    Pt to discuss going to inpatient rehab with his mom and inform CSW of decision.  CSW remains available to help with referral process however needed.  Jorge Ny, LCSW Clinical Social Worker [facility] Desk#: [phone] Cell#: [phone]

## 2020-07-31 NOTE — Telephone Encounter (Signed)
Please help schedule a f/up appt with me

## 2020-07-31 NOTE — Progress Notes (Signed)
ADVANCED HF CLINIC NOTE  PCP: Primary Cardiologist: Dr Haroldine Laws    Reason for F/u: Heart Failure    HPI: Robert [name] is a 40 y/o male with h/o heavy ETOH and cocaine use, PE,  biventricular systolic heart failure and tobacco abuse.   Admitted with acute onset biventricular HF in 5/21. EF 15%. RV down. He was drinking at least 2 beers every night and about 12-24 over the weekend. Also drinking 1/5 of liqour every other day. Cath showed normal coronaries. Elevated filling pressures with normal output. Placed on milrinone and diuresed with IV lasix. As he improved milrinone was weaned off . Transitioned to oral lasix. 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 CT showed small LLL PE. Placed on xarelto. Started on HF meds.  Discharge weight 178 pounds.   Readmitted 6/21 for mild acute blood loss related anemia. EGD showed no active bleeding but evidence of some gastritis.  He was switched to oral PPI twice daily, H&H remained stable and he did not require any transfusions.  He was instructed to continue Xarelto.   Continues to have very frequent ED visits. Admitted 10/25-10/28/21 for ETOH withdrawal symptoms and AKI. Admitted 03/23/2020 with AMS in the setting ETOH abuse. Hypotensive, AKI, and with lactic acidosis. Entresto, spiro, and lasix. All restarted prior to discharge. Returned to the ED on 03/26/20 with nausea/vomiting.   Echo 10/21 EF 30-35% mild RV HK.   Admitted 07/10/20 -07/12/20 with seizure in the setting of ETOH withdrawal.   Admitted 07/29/20-07/31/20 with n/v abdominal pain. Had AKI. GI consulted and recommended EGD. He felt better and refused EGD. Plan to continue PPI + carafate. Xarelto and zoloft stopped. Discharged earlier today from the hospital.   Today he returns for HF follow up.Overall feeling ok. Denies SOB/PND/Orthopnea. Appetite ok. No fever or chills. Has not had any alcohol over the last week. Prior to that he was drinking 5-6  Airplane bottles most days. He has been out of  entresto and spiro for  2-3 weeks because he didn't think he could afford the medications. HF Paramedicine called the pharmacy and he just needs to take new insurance card to the pharmacy.  Lives with his mom. He requires assistance with transportation. For now his Mom is able to take him to appointments. Followed by HF Paramedicine.     Cardiac studies. Echo (03/10/20) EF 30-35% RV normal. No effusion.  ECHO 10/12/2019 Pericardial effusion remains moderate in size. No tamponade physiology. ECHO 10/05/2019 EF <20%  Prominent trabeculation. Grade III DD  [facility] 10/05/2019 showed normal coronaries, elevated filling pressures with normal output.  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  SH:  Social History   Socioeconomic History  . Marital status: Single    Spouse name: Not on file  . Number of children: Not on file  . Years of education: Not on file  . Highest education level: Not on file  Occupational History  . Occupation: Ecologist  Tobacco Use  . Smoking status: Current Every Day Smoker    Packs/day: 0.25    Years: 24.00    Pack years: 6.00    Types: Cigarettes  . Smokeless tobacco: Never Used  Vaping Use  . Vaping Use: Former  Substance and Sexual Activity  . Alcohol use: Yes    Alcohol/week: 8.0 standard drinks    Types: 8 Shots  of liquor per week    Comment: heavy use since age 70  . Drug use: Yes    Frequency: 1.0 times per week    Types: Marijuana  . Sexual activity: Yes  Other Topics Concern  . Not on file  Social History Narrative  . Not on file   Social Determinants of Health   Financial Resource Strain: High Risk  . Difficulty of Paying Living Expenses: Very hard  Food Insecurity: Food Insecurity Present  . Worried About Charity fundraiser in the Last Year: Sometimes true  . Ran Out of Food in the Last Year:  Sometimes true  Transportation Needs: Not on file  Physical Activity: Not on file  Stress: Not on file  Social Connections: Not on file  Intimate Partner Violence: Not on file    FH:  Family History  Problem Relation Age of Onset  . Heart failure Father   . Diabetes Father   . Kidney disease Father   . Heart disease Father 35  . Heart disease Maternal Grandfather 74  . Heart disease Paternal Grandfather 47    Past Medical History:  Diagnosis Date  . Alcohol use   . Alcohol withdrawal ([place]) 03/06/2020  . Asthma   . CHF (congestive heart failure) ([facility])     Current Outpatient Medications  Medication Sig Dispense Refill  . carvedilol (COREG) 3.125 MG tablet Take 1 tablet (3.125 mg total) by mouth 2 (two) times daily. 60 tablet 11  . dapagliflozin propanediol (FARXIGA) 10 MG TABS tablet Take 1 tablet (10 mg total) by mouth daily before breakfast. (Patient taking differently: Take 10 mg by mouth daily.) 30 tablet 6  . digoxin (LANOXIN) 0.125 MG tablet Take 1 tablet (0.125 mg total) by mouth daily. 30 tablet 5  . folic acid (FOLVITE) 1 MG tablet Take 1 tablet (1 mg total) by mouth daily. 30 tablet 3  . ivabradine (CORLANOR) 7.5 MG TABS tablet Take 1 tablet (7.5 mg total) by mouth 2 (two) times daily with a meal. 60 tablet 3  . ondansetron (ZOFRAN ODT) 4 MG disintegrating tablet Take 1 tablet (4 mg total) by mouth every 8 (eight) hours as needed for nausea or vomiting. 20 tablet 0  . pantoprazole (PROTONIX) 40 MG tablet Take 1 tablet (40 mg total) by mouth 2 (two) times daily. 60 tablet 1  . potassium chloride (KLOR-CON) 20 MEQ packet Take 20 mEq by mouth daily.    . QUEtiapine (SEROQUEL) 100 MG tablet Take 1.5 tablets (150 mg total) by mouth daily. 180 tablet 3  . sucralfate (CARAFATE) 1 g tablet Take 1 tablet (1 g total) by mouth 4 (four) times daily -  with meals and at bedtime. 120 tablet 0  . traZODone (DESYREL) 50 MG tablet Take 50 mg by mouth at bedtime.    . chlordiazePOXIDE  (LIBRIUM) 5 MG capsule Take 1 capsule (5 mg total) by mouth 3 (three) times daily as needed for anxiety. (Patient not taking: Reported on 07/31/2020) 12 capsule 0  . sacubitril-valsartan (ENTRESTO) 97-103 MG Take 1 tablet by mouth 2 (two) times daily. (Patient not taking: Reported on 07/31/2020) 60 tablet 11  . spironolactone (ALDACTONE) 25 MG tablet Take 1 tablet (25 mg total) by mouth daily. HOLD until 03/26/20 (Patient not taking: Reported on 07/31/2020) 90 tablet 3  . thiamine 100 MG tablet Take 1 tablet (100 mg total) by mouth daily. (Patient not taking: Reported on 07/31/2020) 30 tablet 0   No current facility-administered medications for this  encounter.   Wt Readings from Last 3 Encounters:  07/31/20 89.4 kg (197 lb)  07/29/20 85.9 kg (189 lb 6 oz)  07/20/20 89.3 kg (196 lb 12.8 oz)    Vitals:   07/31/20 0931  BP: (!) 150/100  Pulse: 85  SpO2: 99%  Weight: 89.4 kg (197 lb)     PHYSICAL EXAM: General:  Appears tired. No resp difficulty HEENT: normal Neck: supple. no JVD. Carotids 2+ bilat; no bruits. No lymphadenopathy or thryomegaly appreciated. Cor: PMI nondisplaced. Regular rate & rhythm. No rubs, gallops or murmurs. Lungs: clear Abdomen: soft, nontender, nondistended. No hepatosplenomegaly. No bruits or masses. Good bowel sounds. Extremities: no cyanosis, clubbing, rash, edema Neuro: alert & orientedx3, cranial nerves grossly intact. moves all 4 extremities w/o difficulty. Affect pleasant  ASSESSMENT & PLAN: 1.Chronic Systolic Heart Failure, NICM  - ECHO 5/21 w/ severely reduced EF 15% RV moderately reduced. Suspect ETOH and cocaine playing a role.  - LHC 5/21 normal cors. 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  - Echo 03/10/20 ---> EF 30-35% RV normal. No effusion.  - ECHo 06/2020 --ejection fraction 30%  - He has not been taking entreston.  - Continue Entresto  97-103 mg bid he will pick up today  from the pharmacy.  - Continue Coreg 3.125 mg bid  - Continue spiro 25 mg daily and he will pick up today from pharmacy.   - Continue digoxin 0.125 mg daily - Continue Corlanor 7.5 mg bid.  - Continue Farxiga 10 mg daily  - Refer for genetic testing for LVNC  - Not a candidate for ICD or advanced therapies w/ polysubstance abuse  - Check BMT next week.   2. Syncope - Denies related to ETOH intoxication -  no driving x 6 months  3.Pulmonary Emboli - CTA w/ small LLL PE 5/21 - LE Venous dopplers negative for DVT  - Given small PE. Risk of ongoing AC likely outweighs benefit - Off xarelto.   4. ETOH Abuse - Heavy drinker for many years since the age of 26.  -01/2020 had 28 day detox [facility] but started drinking again.  - Today he is interested in pursuing again.   5. H/O Cocaine Abuse - most recent UDS 10/31/19 negative   6. Pericardial Effusion - moderate circumferential effusion on cMRI. - resolved on echo 02/2020   7. Tobacco Abuse - still smoking 5 cigs a day.  - Discussed cessation.   8. H/o GIB/ Gastritis w/ ABLA - h/o heavy ETOH abuse + chronic anticoagulation therapy w/ Xarelto - Refused EGD 07/28/20  Check BMET Monday.  He is interested Engineer, mining. HFSW to follow up today Follow up in 2-3 weeks.   Darrick Grinder, NP  9:36 AM

## 2020-07-31 NOTE — Discharge Instructions (Signed)
Nausea and Vomiting, Adult Nausea is feeling sick to your stomach or feeling that you are about to throw up (vomit). Vomiting is when food in your stomach is thrown up and out of the mouth. Throwing up can make you feel weak. It can also make you lose too much water in your body (get dehydrated). If you lose too much water in your body, you may:  Feel tired.  Feel thirsty.  Have a dry mouth.  Have cracked lips.  Go pee (urinate) less often. Older adults and people with other diseases or a weak body defense system (immune system) are at higher risk for losing too much water in the body. If you feel sick to your stomach and you throw up, it is important to follow instructions from your doctor about how to take care of yourself. Follow these instructions at home: Watch your symptoms for any changes. Tell your doctor about them. Follow these instructions to care for yourself at home. Eating and drinking  Take an ORS (oral rehydration solution). This is a drink that is sold at pharmacies and stores.  Drink clear fluids in small amounts as you are able, such as: ? Water. ? Ice chips. ? Fruit juice that has water added (diluted fruit juice). ? Low-calorie sports drinks.  Eat bland, easy-to-digest foods in small amounts as you are able, such as: ? Bananas. ? Applesauce. ? Rice. ? Low-fat (lean) meats. ? Toast. ? Crackers.  Avoid drinking fluids that have a lot of sugar or caffeine in them. This includes energy drinks, sports drinks, and soda.  Avoid alcohol.  Avoid spicy or fatty foods.      General instructions  Take over-the-counter and prescription medicines only as told by your doctor.  Drink enough fluid to keep your pee (urine) pale yellow.  Wash your hands often with soap and water. If you cannot use soap and water, use hand sanitizer.  Make sure that all people in your home wash their hands well and often.  Rest at home while you get better.  Watch your condition  for any changes.  Take slow and deep breaths when you feel sick to your stomach.  Keep all follow-up visits as told by your doctor. This is important. Contact a doctor if:  Your symptoms get worse.  You have new symptoms.  You have a fever.  You cannot drink fluids without throwing up.  You feel sick to your stomach for more than 2 days.  You feel light-headed or dizzy.  You have a headache.  You have muscle cramps.  You have a rash.  You have pain while peeing. Get help right away if:  You have pain in your chest, neck, arm, or jaw.  You feel very weak or you pass out (faint).  You throw up again and again.  You have throw up that is bright red or looks like black coffee grounds.  You have bloody or black poop (stools) or poop that looks like tar.  You have a very bad headache, a stiff neck, or both.  You have very bad pain, cramping, or bloating in your belly (abdomen).  You have trouble breathing.  You are breathing very quickly.  Your heart is beating very quickly.  Your skin feels cold and clammy.  You feel confused.  You have signs of losing too much water in your body, such as: ? Dark pee, very little pee, or no pee. ? Cracked lips. ? Dry mouth. ? Sunken eyes. ?   Sleepiness. ? Weakness. These symptoms may be an emergency. Do not wait to see if the symptoms will go away. Get medical help right away. Call your local emergency services (911 in the [place] Do not drive yourself to the hospital. Summary  Nausea is feeling sick to your stomach or feeling that you are about to throw up (vomit). Vomiting is when food in your stomach is thrown up and out of the mouth.  Follow instructions from your doctor about eating and drinking to keep from losing too much water in your body.  Take over-the-counter and prescription medicines only as told by your doctor.  Contact your doctor if your symptoms get worse or you have new symptoms.  Keep all follow-up  visits as told by your doctor. This is important. This information is not intended to replace advice given to you by your health care provider. Make sure you discuss any questions you have with your health care provider. Document Revised: 08/21/2018 Document Reviewed: 10/07/2017 Elsevier Patient Education  2021 [place].   Acute Kidney Injury, Adult  Acute kidney injury is a sudden worsening of kidney function. The kidneys are organs that have several jobs. They filter the blood to remove waste products and extra fluid. They also maintain a healthy balance of minerals and hormones in the body, which helps control blood pressure and keep bones strong. With this condition, your kidneys do not do their jobs as well as they should. This condition ranges from mild to severe. Over time, it may develop into long-lasting (chronic) kidney disease. Early detection and treatment may prevent acute kidney injury from developing into a chronic condition. What are the causes? Common causes of this condition include:  A problem with blood flow to the kidneys. This may be caused by: ? Low blood pressure (hypotension) or shock. ? Blood loss. ? Heart and blood vessel (cardiovascular) disease. ? Severe burns. ? Liver disease.  Direct damage to the kidneys. This may be caused by: ? Certain medicines. ? A kidney infection. ? Poisoning. ? Being around or in contact with toxic substances. ? A surgical wound. ? A hard, direct hit to the kidney area.  A sudden blockage of urine flow. This may be caused by: ? Cancer. ? Kidney stones. ? An enlarged prostate in males. What increases the risk? You are more likely to develop this condition if you:  Are older than age 65.  Are male.  Are hospitalized, especially if you are in critical condition.  Have certain conditions, such as: ? Chronic kidney disease. ? Diabetes. ? Coronary artery disease and heart failure. ? Pulmonary disease. ? Chronic  liver disease. What are the signs or symptoms? Symptoms of this condition may not be obvious until the condition becomes severe. Symptoms of this condition can include:  Tiredness (lethargy) or difficulty staying awake.  Nausea or vomiting.  Swelling (edema) of the face, legs, ankles, or feet.  Problems with urination, such as: ? Pain in the abdomen, or pain along the side of your stomach (flank). ? Producing little or no urine. ? Passing urine with a weak flow.  Muscle twitches and cramps, especially in the legs.  Confusion or trouble concentrating.  Loss of appetite.  Fever. How is this diagnosed? Your health care provider can diagnose this condition based on your symptoms, medical history, and a physical exam.  You may also have other tests, such as:  Blood tests.  Urine tests.  Imaging tests.  A test in which a sample  of tissue is removed from the kidneys to be examined under a microscope (kidney biopsy). How is this treated? Treatment for this condition depends on the cause and how severe the condition is. In mild cases, treatment may not be needed. The kidneys may heal on their own. In more severe cases, treatment will involve:  Treating the cause of the kidney injury. This may involve changing any medicines you are taking or adjusting your dosage.  Fluids. You may need specialized IV fluids to balance your body's needs.  Having a catheter placed to drain urine and prevent blockages.  Preventing problems from occurring. This may mean avoiding certain medicines or procedures that can cause further injury to the kidneys. In some cases, treatment may also require:  A procedure to remove toxic wastes from the body (dialysis or continuous renal replacement therapy, CRRT).  Surgery. This may be done to repair a torn kidney or to remove the blockage from the urinary system. Follow these instructions at home: Medicines  Take over-the-counter and prescription medicines  only as told by your health care provider.  Do not take any new medicines without your health care provider's approval. Many medicines can worsen your kidney damage.  Do not take any vitamin and mineral supplements without your health care provider's approval. Many nutritional supplements can worsen your kidney damage. Lifestyle  If your health care provider prescribed changes to your diet, follow them. You may need to decrease the amount of protein you eat.  Achieve and maintain a healthy weight. If you need help with this, ask your health care provider.  Start or continue an exercise plan. Try to exercise at least 30 minutes a day, 5 days a week.  Do not use any products that contain nicotine or tobacco, such as cigarettes, e-cigarettes, and chewing tobacco. If you need help quitting, ask your health care provider.   General instructions  Keep track of your blood pressure. Report changes in your blood pressure as told by your health care provider.  Stay up to date with your vaccines. Ask your health care provider which vaccines you need.  Keep all follow-up visits as told by your health care provider. This is important.   Where to find more information  American Association of Kidney Patients: BombTimer.gl  National Kidney Foundation: www.kidney.[place]: https://mathis.com/  [facility]: ? www.lifeoptions.org ? www.kidneyschool.org Contact a health care provider if:  Your symptoms get worse.  You develop new symptoms. Get help right away if:  You develop symptoms of worsening kidney disease, which include: ? Headaches. ? Abnormally dark or light skin. ? Easy bruising. ? Frequent hiccups. ? Chest pain. ? Shortness of breath. ? End of menstruation in women. ? Seizures. ? Confusion or altered mental status. ? Abdominal or back pain. ? Itchiness.  You have a fever.  Your body is producing less urine.  You have pain or bleeding  when you urinate. Summary  Acute kidney injury is a sudden worsening of kidney function.  Acute kidney injury can be caused by problems with blood flow to the kidneys, direct damage to the kidneys, and sudden blockage of urine flow.  Symptoms of this condition may not be obvious until it becomes severe. Symptoms may include edema, lethargy, confusion, nausea or vomiting, and problems passing urine.  This condition can be diagnosed with blood tests, urine tests, and imaging tests. Sometimes a kidney biopsy is done to diagnose this condition.  Treatment for this condition often involves treating  the underlying cause. It is treated with fluids, medicines, diet changes, dialysis, or surgery. This information is not intended to replace advice given to you by your health care provider. Make sure you discuss any questions you have with your health care provider. Document Revised: 03/09/2019 Document Reviewed: 03/09/2019 Elsevier Patient Education  2021 [employer].

## 2020-07-31 NOTE — Telephone Encounter (Signed)
Entered in error

## 2020-08-02 ENCOUNTER — Telehealth (HOSPITAL_COMMUNITY): Payer: Self-pay | Admitting: Licensed Clinical Social Worker

## 2020-08-02 NOTE — Telephone Encounter (Signed)
CSW called pt to check in.  Pt reports he has not made a decision at this time about going back into inpatient rehab or not.  He states him and his mom are still discussing- encouraged him to reach out if he needs any assistance from myself or the clinic.  Will continue to follow and assist as needed  Jorge Ny, [facility] Desk#: [phone] Cell#: [phone]

## 2020-08-07 ENCOUNTER — Telehealth: Payer: Self-pay | Admitting: Nurse Practitioner

## 2020-08-07 ENCOUNTER — Encounter: Payer: Self-pay | Admitting: Nurse Practitioner

## 2020-08-07 ENCOUNTER — Other Ambulatory Visit: Payer: Self-pay

## 2020-08-07 ENCOUNTER — Telehealth (HOSPITAL_COMMUNITY): Payer: Self-pay

## 2020-08-07 ENCOUNTER — Other Ambulatory Visit (HOSPITAL_COMMUNITY): Payer: BC Managed Care – PPO

## 2020-08-07 DIAGNOSIS — R569 Unspecified convulsions: Secondary | ICD-10-CM

## 2020-08-07 DIAGNOSIS — F10221 Alcohol dependence with intoxication delirium: Secondary | ICD-10-CM

## 2020-08-07 DIAGNOSIS — F10939 Alcohol use, unspecified with withdrawal, unspecified: Secondary | ICD-10-CM

## 2020-08-07 DIAGNOSIS — F10239 Alcohol dependence with withdrawal, unspecified: Secondary | ICD-10-CM

## 2020-08-07 NOTE — Telephone Encounter (Signed)
He is overdue for an appt with me. How is he taking medication? Has he had any ETOH since last hospital discharge?

## 2020-08-07 NOTE — Telephone Encounter (Signed)
Pt was no show for appt 07/17/2020 hosp f/u. Pt is scheduled on 08/08/2020. 1st occurrence. Fee waived. Letter mailed.

## 2020-08-07 NOTE — Telephone Encounter (Signed)
Spoke to Robert [name] who reports he will meet me at clinic today for his lab appointment so we can review medications and fill his pill boxes. Call complete.

## 2020-08-07 NOTE — Telephone Encounter (Signed)
Pt mom states he is in need of medication refill (Librium) because he is out of the medication and she does not want him to suffer from a seizure, spoke with patient and he verbalized it was okay to communicate with his mom and that he is in need of medication. Please advise.

## 2020-08-08 ENCOUNTER — Encounter: Payer: Self-pay | Admitting: Nurse Practitioner

## 2020-08-08 ENCOUNTER — Inpatient Hospital Stay: Payer: BC Managed Care – PPO | Admitting: Nurse Practitioner

## 2020-08-08 NOTE — Telephone Encounter (Signed)
Confirmed with mother we will discuss during next ov on 08/09/20

## 2020-08-08 NOTE — Telephone Encounter (Signed)
Proceed with dismissal if he no show this appt

## 2020-08-08 NOTE — Telephone Encounter (Signed)
Pt was no show again today for OV but has been rescheduled to 08/09/20 11am

## 2020-08-09 ENCOUNTER — Other Ambulatory Visit: Payer: Self-pay

## 2020-08-09 ENCOUNTER — Encounter: Payer: Self-pay | Admitting: Nurse Practitioner

## 2020-08-09 ENCOUNTER — Ambulatory Visit: Payer: BC Managed Care – PPO | Admitting: Nurse Practitioner

## 2020-08-09 VITALS — BP 114/84 | HR 103 | Temp 97.6°F | Ht 70.0 in | Wt 195.4 lb

## 2020-08-09 DIAGNOSIS — F39 Unspecified mood [affective] disorder: Secondary | ICD-10-CM | POA: Diagnosis not present

## 2020-08-09 DIAGNOSIS — K2951 Unspecified chronic gastritis with bleeding: Secondary | ICD-10-CM | POA: Diagnosis not present

## 2020-08-09 DIAGNOSIS — F1910 Other psychoactive substance abuse, uncomplicated: Secondary | ICD-10-CM | POA: Diagnosis not present

## 2020-08-09 DIAGNOSIS — F10239 Alcohol dependence with withdrawal, unspecified: Secondary | ICD-10-CM | POA: Diagnosis not present

## 2020-08-09 DIAGNOSIS — R569 Unspecified convulsions: Secondary | ICD-10-CM

## 2020-08-09 MED ORDER — ONDANSETRON 4 MG PO TBDP: 4.0000 mg | ORAL_TABLET | Freq: Three times a day (TID) | ORAL | 0 refills | Status: DC | PRN

## 2020-08-09 MED ORDER — CHLORDIAZEPOXIDE HCL 5 MG PO CAPS: 5.0000 mg | ORAL_CAPSULE | Freq: Three times a day (TID) | ORAL | 0 refills | Status: DC | PRN

## 2020-08-09 NOTE — Patient Instructions (Signed)
Take medications as prescribed. Eat small frequent meals Avoid high fat meals.

## 2020-08-09 NOTE — Progress Notes (Signed)
Subjective:  Patient ID: Robert [name], male    DOB: 12-Dec-1980  Age: 40 y.o. MRN: [id number]  CC: Hospitalization Follow-up [facility] follow up)  HPI  Mood disorder ([place]) Verbalizes frustration about living with his mother and his declining health. Admits to not take  His medications as prescribed. Last ETOH consumption 3days ago States he is unable to afford the outpatient detox program: [facility]. Denies any seizure activity. Denies any intend to harm self. Denies any hallucination  He is able to contract for safety: he is to inform his mother or call 911 if worsening suicidal ideation. He agree not to resume ETOH consumption He agreed to resume daily use of seroquel He agreed to referral to IOP F/up in 1week  Gastritis He is not taking PPI as prescribed Has intermittent ABD pain, nausea and vomiting. This improves with eating. Reports lack of appetite when he drinks ETOH. He was another referral to GI to discuss need for endoscopy  Advised to resume PPI Maintain small frequent meals. Entered referral to GI  Depression screen [facility] 2/9 08/09/2020 11/21/2019 11/08/2019  Decreased Interest 2 2 2   Down, Depressed, Hopeless 3 2 1   PHQ - 2 Score 5 4 3   Altered sleeping 3 3 2   Tired, decreased energy 3 3 2   Change in appetite 2 1 0  Feeling bad or failure about yourself  0 2 2  Trouble concentrating 2 2 0  Moving slowly or fidgety/restless 1 0 1  Suicidal thoughts 1 1 0  PHQ-9 Score 17 16 10   Difficult doing work/chores - Somewhat difficult Somewhat difficult   GAD 7 : Generalized Anxiety Score 08/09/2020  Nervous, Anxious, on Edge 2  Control/stop worrying 2  Worry too much - different things 2  Trouble relaxing 2  Restless 2  Easily annoyed or irritable 2  Afraid - awful might happen 2  Total GAD 7 Score 14   Reviewed past Medical, Social and Family history today.  Outpatient Medications Prior to Visit  Medication Sig Dispense Refill  . carvedilol  (COREG) 3.125 MG tablet Take 1 tablet (3.125 mg total) by mouth 2 (two) times daily. 60 tablet 11  . dapagliflozin propanediol (FARXIGA) 10 MG TABS tablet Take 1 tablet (10 mg total) by mouth daily before breakfast. (Patient taking differently: Take 10 mg by mouth daily.) 30 tablet 6  . digoxin (LANOXIN) 0.125 MG tablet Take 1 tablet (0.125 mg total) by mouth daily. 30 tablet 5  . folic acid (FOLVITE) 1 MG tablet Take 1 tablet (1 mg total) by mouth daily. 30 tablet 3  . ivabradine (CORLANOR) 7.5 MG TABS tablet Take 1 tablet (7.5 mg total) by mouth 2 (two) times daily with a meal. 60 tablet 3  . pantoprazole (PROTONIX) 40 MG tablet Take 1 tablet (40 mg total) by mouth 2 (two) times daily. 60 tablet 1  . potassium chloride (KLOR-CON) 20 MEQ packet Take 20 mEq by mouth daily.    . QUEtiapine (SEROQUEL) 100 MG tablet Take 1.5 tablets (150 mg total) by mouth daily. 180 tablet 3  . sacubitril-valsartan (ENTRESTO) 97-103 MG Take 1 tablet by mouth 2 (two) times daily. 60 tablet 11  . spironolactone (ALDACTONE) 25 MG tablet Take 1 tablet (25 mg total) by mouth daily. HOLD until 03/26/20 90 tablet 3  . sucralfate (CARAFATE) 1 g tablet Take 1 tablet (1 g total) by mouth 4 (four) times daily -  with meals and at bedtime. 120 tablet 0  . thiamine 100  MG tablet Take 1 tablet (100 mg total) by mouth daily. 30 tablet 0  . ondansetron (ZOFRAN ODT) 4 MG disintegrating tablet Take 1 tablet (4 mg total) by mouth every 8 (eight) hours as needed for nausea or vomiting. 20 tablet 0  . traZODone (DESYREL) 50 MG tablet Take 50 mg by mouth at bedtime.    . chlordiazePOXIDE (LIBRIUM) 5 MG capsule Take 1 capsule (5 mg total) by mouth 3 (three) times daily as needed for anxiety. (Patient not taking: No sig reported) 12 capsule 0   No facility-administered medications prior to visit.    ROS See HPI  Objective:  BP 114/84 (BP Location: Left Arm, Patient Position: Sitting, Cuff Size: Large)   Pulse (!) 103   Temp 97.6 F  (36.4 C) (Temporal)   Ht 5\' 10"  (1.778 m)   Wt 195 lb 6.4 oz (88.6 kg)   SpO2 99%   BMI 28.04 kg/m   Physical Exam Psychiatric:        Attention and Perception: Attention normal.        Mood and Affect: Affect is labile and angry.        Speech: Speech normal.        Behavior: Behavior is cooperative.        Thought Content: Thought content is not paranoid. Thought content includes suicidal ideation. Thought content does not include homicidal ideation. Thought content does not include homicidal or suicidal plan.        Cognition and Memory: Cognition normal.        Judgment: Judgment is impulsive.    Assessment & Plan:  This visit occurred during the SARS-CoV-2 public health emergency.  Safety protocols were in place, including screening questions prior to the visit, additional usage of staff PPE, and extensive cleaning of exam room while observing appropriate contact time as indicated for disinfecting solutions.   Sabastion was seen today for hospitalization follow-up.  Diagnoses and all orders for this visit:  Mood disorder [facility]) -     Ambulatory referral to Psychiatry -     Ambulatory referral to Psychology -     Ambulatory referral to [facility] Intensive OP Program  Polysubstance abuse ([place]) -     Ambulatory referral to [facility] Intensive OP Program  Chronic gastritis with bleeding, unspecified gastritis type -     Ambulatory referral to Gastroenterology -     ondansetron (ZOFRAN ODT) 4 MG disintegrating tablet; Take 1 tablet (4 mg total) by mouth every 8 (eight) hours as needed for nausea or vomiting.  Alcohol withdrawal seizure with complication [facility]) -     Ambulatory referral to Psychiatry -     Ambulatory referral to Psychology -     chlordiazePOXIDE (LIBRIUM) 5 MG capsule; Take 1 capsule (5 mg total) by mouth 3 (three) times daily as needed for anxiety. -     Ambulatory referral to [facility] Intensive OP Program    Problem List Items Addressed This Visit      Digestive   Gastritis     He is not taking PPI as prescribed Has intermittent ABD pain, nausea and vomiting. This improves with eating. Reports lack of appetite when he drinks ETOH. He was another referral to GI to discuss need for endoscopy  Advised to resume PPI Maintain small frequent meals. Entered referral to GI      Relevant Medications   ondansetron (ZOFRAN ODT) 4 MG disintegrating tablet   Other Relevant Orders   Ambulatory referral to Gastroenterology  Other   Mood disorder ([place]) - Primary    Verbalizes frustration about living with his mother and his declining health. Admits to not take  His medications as prescribed. Last ETOH consumption 3days ago States he is unable to afford the outpatient detox program: [facility]. Denies any seizure activity. Denies any intend to harm self. Denies any hallucination  He is able to contract for safety: he is to inform his mother or call 911 if worsening suicidal ideation. He agree not to resume ETOH consumption He agreed to resume daily use of seroquel He agreed to referral to IOP F/up in 1week      Relevant Orders   Ambulatory referral to Psychiatry   Ambulatory referral to Psychology   Ambulatory referral to [facility] Intensive OP Program   Polysubstance abuse [facility])   Relevant Orders   Ambulatory referral to [facility] Intensive OP Program    Other Visit Diagnoses    Alcohol withdrawal seizure with complication ([place])       Relevant Medications   chlordiazePOXIDE (LIBRIUM) 5 MG capsule   Other Relevant Orders   Ambulatory referral to Psychiatry   Ambulatory referral to Psychology   Ambulatory referral to [facility] Intensive OP Program      Follow-up: Return in about 1 week (around 08/16/2020) for mood disorder (28mins).  [name], NP

## 2020-08-10 ENCOUNTER — Encounter: Payer: Self-pay | Admitting: Nurse Practitioner

## 2020-08-10 NOTE — Assessment & Plan Note (Addendum)
Verbalizes frustration about living with his mother and his declining health. Admits to not take  His medications as prescribed. Last ETOH consumption 3days ago States he is unable to afford the outpatient detox program: [facility]. Denies any seizure activity. Denies any intend to harm self. Denies any hallucination  He is able to contract for safety: he is to inform his mother or call 911 if worsening suicidal ideation. He agree not to resume ETOH consumption He agreed to resume daily use of seroquel He agreed to referral to IOP F/up in 1week

## 2020-08-10 NOTE — Assessment & Plan Note (Signed)
He is not taking PPI as prescribed Has intermittent ABD pain, nausea and vomiting. This improves with eating. Reports lack of appetite when he drinks ETOH. He was another referral to GI to discuss need for endoscopy  Advised to resume PPI Maintain small frequent meals. Entered referral to GI

## 2020-08-14 ENCOUNTER — Telehealth (HOSPITAL_COMMUNITY): Payer: Self-pay

## 2020-08-14 NOTE — Telephone Encounter (Signed)
Call placed to Mr. Metzner in regards to discharge from the paramedicine program. Mr. Devan agreed to discharge plan and understands how to reach out to clinic for needs and reports understanding of upcoming appointments. Holston reports he has all his medicines and understands how to take them and states he does not need to review them during this call. Frazier given upcoming appointment list which he says he has wrote down. Patient officially discharged from paramedicine. Discharge call complete.   [facility] is now discharged from [facility].  Patient has/has not met the following goals:  Yes :Patient expresses basic understanding of medications and what they are for Yes :Patient able to verbalize heart failure specific dietary/fluid restrictions Yes :Patient is aware of who to call if they have medical concerns or if they need to schedule or change appts Yes :Patient has a scale for daily weights and weighs regularly Yes :Patient able to verbalize concerning symptoms when they should call the HF clinic (weight gain ranges, etc) Yes :Patient has a PCP and has seen within the past year or has upcoming appt Yes :Patient has reliable access to getting their medications Yes :Patient has shown they are able to reorder medications reliably No :Patient has had admission in past 30 days- if yes how many? Yes :Patient has had admission in past 90 days- if yes how many? 2  Discharge Comments: Patient being discharged due to on-going non compliance with program per HF paramedicine team and PA Amy Clegg. Patient reports he knows how and when to take his medicines and what he is taking. He reports understanding on how to schedule appointments and to call clinic if needed. Discharge complete.       

## 2020-08-16 ENCOUNTER — Ambulatory Visit: Payer: BC Managed Care – PPO | Admitting: Nurse Practitioner

## 2020-08-16 ENCOUNTER — Other Ambulatory Visit: Payer: Self-pay

## 2020-08-16 ENCOUNTER — Encounter: Payer: Self-pay | Admitting: Nurse Practitioner

## 2020-08-16 VITALS — BP 130/88 | HR 86 | Temp 97.0°F | Ht 70.0 in | Wt 199.8 lb

## 2020-08-16 DIAGNOSIS — F39 Unspecified mood [affective] disorder: Secondary | ICD-10-CM

## 2020-08-16 NOTE — Assessment & Plan Note (Addendum)
Reports improved mood, sleep and appetite. Denies any SI/HI or hallucinations Denies any ETOH intake since last OV. Maintain current medications Encourage to schedule appt with psychiatry and psychology. F/up in 81month.

## 2020-08-16 NOTE — Patient Instructions (Signed)
Call [phone] to schedule appt with psychiatry and therapist.  Maintain current medications

## 2020-08-16 NOTE — Progress Notes (Signed)
Subjective:  Patient ID: Robert [name], male    DOB: May 09, 1981  Age: 40 y.o. MRN: [id number]  CC: Follow-up (1 week f/u on mood disorder)  HPI  Mood disorder ([place]) Reports improved mood, sleep and appetite. Denies any SI/HI or hallucinations Denies any ETOH intake since last OV. Maintain current medications Encourage to schedule appt with psychiatry and psychology. F/up in 80month.  Wt Readings from Last 3 Encounters:  08/16/20 199 lb 12.8 oz (90.6 kg)  08/09/20 195 lb 6.4 oz (88.6 kg)  07/31/20 197 lb (89.4 kg)   BP Readings from Last 3 Encounters:  08/16/20 130/88  08/09/20 114/84  07/31/20 (!) 150/100   Reviewed past Medical, Social and Family history today.  Outpatient Medications Prior to Visit  Medication Sig Dispense Refill  . carvedilol (COREG) 3.125 MG tablet Take 1 tablet (3.125 mg total) by mouth 2 (two) times daily. 60 tablet 11  . chlordiazePOXIDE (LIBRIUM) 5 MG capsule Take 1 capsule (5 mg total) by mouth 3 (three) times daily as needed for anxiety. 12 capsule 0  . dapagliflozin propanediol (FARXIGA) 10 MG TABS tablet Take 1 tablet (10 mg total) by mouth daily before breakfast. (Patient taking differently: Take 10 mg by mouth daily.) 30 tablet 6  . digoxin (LANOXIN) 0.125 MG tablet Take 1 tablet (0.125 mg total) by mouth daily. 30 tablet 5  . folic acid (FOLVITE) 1 MG tablet Take 1 tablet (1 mg total) by mouth daily. 30 tablet 3  . ivabradine (CORLANOR) 7.5 MG TABS tablet Take 1 tablet (7.5 mg total) by mouth 2 (two) times daily with a meal. 60 tablet 3  . ondansetron (ZOFRAN ODT) 4 MG disintegrating tablet Take 1 tablet (4 mg total) by mouth every 8 (eight) hours as needed for nausea or vomiting. 20 tablet 0  . pantoprazole (PROTONIX) 40 MG tablet Take 1 tablet (40 mg total) by mouth 2 (two) times daily. 60 tablet 1  . potassium chloride (KLOR-CON) 20 MEQ packet Take 20 mEq by mouth daily.    . QUEtiapine (SEROQUEL) 100 MG tablet Take 1.5 tablets (150 mg  total) by mouth daily. 180 tablet 3  . sacubitril-valsartan (ENTRESTO) 97-103 MG Take 1 tablet by mouth 2 (two) times daily. 60 tablet 11  . spironolactone (ALDACTONE) 25 MG tablet Take 1 tablet (25 mg total) by mouth daily. HOLD until 03/26/20 90 tablet 3  . sucralfate (CARAFATE) 1 g tablet Take 1 tablet (1 g total) by mouth 4 (four) times daily -  with meals and at bedtime. 120 tablet 0  . thiamine 100 MG tablet Take 1 tablet (100 mg total) by mouth daily. 30 tablet 0   No facility-administered medications prior to visit.    ROS See HPI  Objective:  BP 130/88 (BP Location: Left Arm, Patient Position: Sitting, Cuff Size: Large)   Pulse 86   Temp (!) 97 F (36.1 C) (Temporal)   Ht 5\' 10"  (1.778 m)   Wt 199 lb 12.8 oz (90.6 kg)   SpO2 97%   BMI 28.67 kg/m   Physical Exam Constitutional:      General: He is not in acute distress. Cardiovascular:     Rate and Rhythm: Normal rate and regular rhythm.     Pulses: Normal pulses.     Heart sounds: Normal heart sounds.  Pulmonary:     Effort: Pulmonary effort is normal.     Breath sounds: Normal breath sounds.  Musculoskeletal:     Right lower leg: No edema.  Left lower leg: No edema.  Neurological:     Mental Status: He is alert and oriented to person, place, and time.  Psychiatric:        Attention and Perception: Attention normal.        Mood and Affect: Mood normal.        Speech: Speech normal.        Behavior: Behavior is cooperative.        Thought Content: Thought content normal.    Assessment & Plan:  This visit occurred during the SARS-CoV-2 public health emergency.  Safety protocols were in place, including screening questions prior to the visit, additional usage of staff PPE, and extensive cleaning of exam room while observing appropriate contact time as indicated for disinfecting solutions.   Uel was seen today for follow-up.  Diagnoses and all orders for this visit:  Mood disorder ([place])    Problem  List Items Addressed This Visit      Other   Mood disorder ([name]) - Primary    Reports improved mood, sleep and appetite. Denies any SI/HI or hallucinations Denies any ETOH intake since last OV. Maintain current medications Encourage to schedule appt with psychiatry and psychology. F/up in 25month.         Follow-up: Return in about 4 weeks (around 09/13/2020) for Mood Disorder (64mins, completed PHQ/GAD).  [name], NP

## 2020-08-19 ENCOUNTER — Encounter (HOSPITAL_COMMUNITY): Payer: Self-pay

## 2020-08-19 ENCOUNTER — Other Ambulatory Visit: Payer: Self-pay

## 2020-08-19 ENCOUNTER — Observation Stay (HOSPITAL_COMMUNITY)
Admission: EM | Admit: 2020-08-19 | Discharge: 2020-08-20 | Disposition: A | Discharge status: Home / Self Care | Payer: BC Managed Care – PPO | Attending: Internal Medicine | Admitting: Internal Medicine

## 2020-08-19 DIAGNOSIS — F10230 Alcohol dependence with withdrawal, uncomplicated: Secondary | ICD-10-CM

## 2020-08-19 DIAGNOSIS — R9431 Abnormal electrocardiogram [ECG] [EKG]: Secondary | ICD-10-CM

## 2020-08-19 DIAGNOSIS — F10930 Alcohol use, unspecified with withdrawal, uncomplicated: Secondary | ICD-10-CM

## 2020-08-19 DIAGNOSIS — Z20822 Contact with and (suspected) exposure to covid-19: Secondary | ICD-10-CM | POA: Insufficient documentation

## 2020-08-19 DIAGNOSIS — I5022 Chronic systolic (congestive) heart failure: Secondary | ICD-10-CM | POA: Diagnosis not present

## 2020-08-19 DIAGNOSIS — F1910 Other psychoactive substance abuse, uncomplicated: Secondary | ICD-10-CM | POA: Insufficient documentation

## 2020-08-19 DIAGNOSIS — N179 Acute kidney failure, unspecified: Principal | ICD-10-CM

## 2020-08-19 DIAGNOSIS — F10239 Alcohol dependence with withdrawal, unspecified: Secondary | ICD-10-CM | POA: Diagnosis not present

## 2020-08-19 DIAGNOSIS — K2950 Unspecified chronic gastritis without bleeding: Secondary | ICD-10-CM

## 2020-08-19 DIAGNOSIS — R112 Nausea with vomiting, unspecified: Secondary | ICD-10-CM | POA: Diagnosis present

## 2020-08-19 DIAGNOSIS — J45909 Unspecified asthma, uncomplicated: Secondary | ICD-10-CM | POA: Diagnosis not present

## 2020-08-19 DIAGNOSIS — F17210 Nicotine dependence, cigarettes, uncomplicated: Secondary | ICD-10-CM | POA: Insufficient documentation

## 2020-08-19 DIAGNOSIS — K2951 Unspecified chronic gastritis with bleeding: Secondary | ICD-10-CM

## 2020-08-19 DIAGNOSIS — Z79899 Other long term (current) drug therapy: Secondary | ICD-10-CM | POA: Insufficient documentation

## 2020-08-19 LAB — LIPASE, BLOOD: Lipase: 31 U/L (ref 11–51)

## 2020-08-19 LAB — CBC
HCT: 40.6 % (ref 39.0–52.0)
Hemoglobin: 13.5 g/dL (ref 13.0–17.0)
MCH: 29.9 pg (ref 26.0–34.0)
MCHC: 33.3 g/dL (ref 30.0–36.0)
MCV: 90 fL (ref 80.0–100.0)
Platelets: 301 10*3/uL (ref 150–400)
RBC: 4.51 MIL/uL (ref 4.22–5.81)
RDW: 14.4 % (ref 11.5–15.5)
WBC: 11.3 10*3/uL — ABNORMAL HIGH (ref 4.0–10.5)
nRBC: 0 % (ref 0.0–0.2)

## 2020-08-19 LAB — BASIC METABOLIC PANEL
Anion gap: 24 — ABNORMAL HIGH (ref 5–15)
BUN: 16 mg/dL (ref 6–20)
CO2: 28 mmol/L (ref 22–32)
Calcium: 10.1 mg/dL (ref 8.9–10.3)
Chloride: 89 mmol/L — ABNORMAL LOW (ref 98–111)
Creatinine, Ser: 2.15 mg/dL — ABNORMAL HIGH (ref 0.61–1.24)
GFR, Estimated: 39 mL/min — ABNORMAL LOW (ref >60)
Glucose, Bld: 181 mg/dL — ABNORMAL HIGH (ref 70–99)
Potassium: 4 mmol/L (ref 3.5–5.1)
Sodium: 141 mmol/L (ref 135–145)

## 2020-08-19 LAB — MAGNESIUM: Magnesium: 1.4 mg/dL — ABNORMAL LOW (ref 1.7–2.4)

## 2020-08-19 LAB — PHOSPHORUS: Phosphorus: 2.3 mg/dL — ABNORMAL LOW (ref 2.5–4.6)

## 2020-08-19 LAB — ETHANOL: Alcohol, Ethyl (B): <10 mg/dL (ref <10)

## 2020-08-19 LAB — SARS CORONAVIRUS 2 (TAT 6-24 HRS): SARS Coronavirus 2: NEGATIVE

## 2020-08-19 LAB — TROPONIN I (HIGH SENSITIVITY): Troponin I (High Sensitivity): 13 ng/L (ref <18)

## 2020-08-19 MED ORDER — FOLIC ACID 1 MG PO TABS
1.0000 mg | ORAL_TABLET | Freq: Every day | ORAL | Status: DC
  Administered 2020-08-19 – 2020-08-20 (×2): 1 mg via ORAL
  Filled 2020-08-19 (×2): qty 1

## 2020-08-19 MED ORDER — SODIUM CHLORIDE 0.9 % IV SOLN
12.5000 mg | Freq: Three times a day (TID) | INTRAVENOUS | Status: DC | PRN
  Administered 2020-08-19: 12.5 mg via INTRAVENOUS
  Filled 2020-08-19 (×2): qty 0.5

## 2020-08-19 MED ORDER — ACETAMINOPHEN 650 MG RE SUPP: 650.0000 mg | Freq: Four times a day (QID) | RECTAL | Status: DC | PRN

## 2020-08-19 MED ORDER — SACUBITRIL-VALSARTAN 97-103 MG PO TABS
1.0000 | ORAL_TABLET | Freq: Two times a day (BID) | ORAL | Status: DC
  Administered 2020-08-19 – 2020-08-20 (×2): 1 via ORAL
  Filled 2020-08-19 (×2): qty 1

## 2020-08-19 MED ORDER — THIAMINE HCL 100 MG/ML IJ SOLN
100.0000 mg | Freq: Every day | INTRAMUSCULAR | Status: DC
  Filled 2020-08-19: qty 2

## 2020-08-19 MED ORDER — THIAMINE HCL 100 MG/ML IJ SOLN: 100.0000 mg | Freq: Every day | INTRAMUSCULAR | Status: DC

## 2020-08-19 MED ORDER — THIAMINE HCL 100 MG PO TABS
100.0000 mg | ORAL_TABLET | Freq: Every day | ORAL | Status: DC
  Administered 2020-08-20: 100 mg via ORAL
  Filled 2020-08-19 (×2): qty 1

## 2020-08-19 MED ORDER — CARVEDILOL 3.125 MG PO TABS
3.1250 mg | ORAL_TABLET | Freq: Two times a day (BID) | ORAL | Status: DC
  Administered 2020-08-19 – 2020-08-20 (×2): 3.125 mg via ORAL
  Filled 2020-08-19 (×2): qty 1

## 2020-08-19 MED ORDER — SODIUM CHLORIDE 0.9 % IV SOLN
INTRAVENOUS | Status: DC
  Administered 2020-08-19: 1000 mL via INTRAVENOUS

## 2020-08-19 MED ORDER — LORAZEPAM 2 MG/ML IJ SOLN: 0.0000 mg | Freq: Four times a day (QID) | INTRAMUSCULAR | Status: DC

## 2020-08-19 MED ORDER — LORAZEPAM 1 MG PO TABS: 1.0000 mg | ORAL_TABLET | ORAL | Status: DC | PRN

## 2020-08-19 MED ORDER — MORPHINE SULFATE (PF) 4 MG/ML IV SOLN
4.0000 mg | Freq: Once | INTRAVENOUS | Status: AC
Start: 2020-08-19 — End: 2020-08-19
  Administered 2020-08-19: 4 mg via INTRAVENOUS
  Filled 2020-08-19: qty 1

## 2020-08-19 MED ORDER — LORAZEPAM 2 MG/ML IJ SOLN
1.0000 mg | INTRAMUSCULAR | Status: DC | PRN
Start: 2020-08-19 — End: 2020-08-20
  Administered 2020-08-19: 1 mg via INTRAVENOUS
  Filled 2020-08-19: qty 1

## 2020-08-19 MED ORDER — SPIRONOLACTONE 25 MG PO TABS
25.0000 mg | ORAL_TABLET | Freq: Every day | ORAL | Status: DC
  Administered 2020-08-19 – 2020-08-20 (×2): 25 mg via ORAL
  Filled 2020-08-19 (×2): qty 1

## 2020-08-19 MED ORDER — SERTRALINE HCL 50 MG PO TABS: 25.0000 mg | ORAL_TABLET | Freq: Every day | ORAL | Status: DC | PRN

## 2020-08-19 MED ORDER — ONDANSETRON HCL 4 MG/2ML IJ SOLN
4.0000 mg | Freq: Once | INTRAMUSCULAR | Status: AC
  Administered 2020-08-19: 4 mg via INTRAVENOUS
  Filled 2020-08-19: qty 2

## 2020-08-19 MED ORDER — DAPAGLIFLOZIN PROPANEDIOL 10 MG PO TABS: 10.0000 mg | ORAL_TABLET | Freq: Every day | ORAL | Status: DC

## 2020-08-19 MED ORDER — ACETAMINOPHEN 325 MG PO TABS: 650.0000 mg | ORAL_TABLET | Freq: Four times a day (QID) | ORAL | Status: DC | PRN

## 2020-08-19 MED ORDER — THIAMINE HCL 100 MG PO TABS
100.0000 mg | ORAL_TABLET | Freq: Every day | ORAL | Status: DC
  Administered 2020-08-19: 100 mg via ORAL
  Filled 2020-08-19: qty 1

## 2020-08-19 MED ORDER — LORAZEPAM 1 MG PO TABS: 0.0000 mg | ORAL_TABLET | Freq: Two times a day (BID) | ORAL | Status: DC

## 2020-08-19 MED ORDER — LACTATED RINGERS IV BOLUS
1000.0000 mL | Freq: Once | INTRAVENOUS | Status: AC
  Administered 2020-08-19: 1000 mL via INTRAVENOUS

## 2020-08-19 MED ORDER — MAGNESIUM SULFATE 2 GM/50ML IV SOLN
2.0000 g | Freq: Once | INTRAVENOUS | Status: AC
  Administered 2020-08-19: 2 g via INTRAVENOUS
  Filled 2020-08-19: qty 50

## 2020-08-19 MED ORDER — ADULT MULTIVITAMIN W/MINERALS CH
1.0000 | ORAL_TABLET | Freq: Every day | ORAL | Status: DC
  Administered 2020-08-19 – 2020-08-20 (×2): 1 via ORAL
  Filled 2020-08-19 (×2): qty 1

## 2020-08-19 MED ORDER — LORAZEPAM 1 MG PO TABS
0.0000 mg | ORAL_TABLET | Freq: Four times a day (QID) | ORAL | Status: DC
  Filled 2020-08-19: qty 2

## 2020-08-19 MED ORDER — LORAZEPAM 2 MG/ML IJ SOLN: 0.0000 mg | Freq: Two times a day (BID) | INTRAMUSCULAR | Status: DC

## 2020-08-19 NOTE — Progress Notes (Signed)
Sent text message to [phone] Dr Arrien regarding no home meds noted on pt admission orders 

## 2020-08-19 NOTE — ED Notes (Signed)
Pt ambulated to restroom at this time.

## 2020-08-19 NOTE — ED Notes (Signed)
Water provided to pt at this time.  

## 2020-08-19 NOTE — ED Notes (Signed)
Attempted to call report. RN unable to take report.  

## 2020-08-19 NOTE — H&P (Signed)
History and Physical    Robert [name] OIB:[id number] DOB: 13-Nov-1980 DOA: 08/19/2020  PCP: Flossie Buffy, NP  Patient coming from: Home  Chief Complaint: Generalized body aches  HPI: Robert [name] is a 40 y.o. male with medical history significant of polysubstance abuse, CHF comes in with less than a day of malaise and not feeling well.  He has had approximately 5 episodes of vomiting yesterday.  He denies any alcohol or drug usage.  He denies any abdominal pain.  He reports he is feeling much better right now with IV fluids in the ED.  He is found to have acute kidney injury with a creatinine of 2.15 and a magnesium level slightly low at 1.4.  He has been referred for admission for IV fluid hydration and replacement of his magnesium levels.  Review of Systems: As per HPI otherwise 10 point review of systems negative.   Past Medical History:  Diagnosis Date  . Alcohol use   . Alcohol withdrawal ([place]) 03/06/2020  . Asthma   . CHF (congestive heart failure) ([place])     Past Surgical History:  Procedure Laterality Date  . ESOPHAGOGASTRODUODENOSCOPY (EGD) WITH PROPOFOL N/A 11/01/2019   Procedure: ESOPHAGOGASTRODUODENOSCOPY (EGD) WITH PROPOFOL;  Surgeon: [name], MD;  Location: [facility] ENDOSCOPY;  Service: Endoscopy;  Laterality: N/A;  . RIGHT/LEFT HEART CATH AND CORONARY ANGIOGRAPHY N/A 10/06/2019   Procedure: RIGHT/LEFT HEART CATH AND CORONARY ANGIOGRAPHY;  Surgeon: [name], MD;  Location: [facility];  Service: Cardiovascular;  Laterality: N/A;  . WRIST SURGERY       reports that he has been smoking cigarettes. He has a 6.00 pack-year smoking history. He has never used smokeless tobacco. He reports current alcohol use of about 8.0 standard drinks of alcohol per week. He reports current drug use. Frequency: 1.00 time per week. Drug: Marijuana.  Allergies  Allergen Reactions  . Bactrim [Sulfamethoxazole-Trimethoprim] Rash    Family History  Problem  Relation Age of Onset  . Heart failure Father   . Diabetes Father   . Kidney disease Father   . Heart disease Father 64  . Heart disease Maternal Grandfather 43  . Heart disease Paternal Grandfather 25    Prior to Admission medications   Medication Sig Start Date End Date Taking? Authorizing Provider  carvedilol (COREG) 3.125 MG tablet Take 1 tablet (3.125 mg total) by mouth 2 (two) times daily. 04/11/20 04/11/21  Clegg, Amy D, NP  chlordiazePOXIDE (LIBRIUM) 5 MG capsule Take 1 capsule (5 mg total) by mouth 3 (three) times daily as needed for anxiety. 08/09/20   Nche, Charlene Brooke, NP  dapagliflozin propanediol (FARXIGA) 10 MG TABS tablet Take 1 tablet (10 mg total) by mouth daily before breakfast. Patient taking differently: Take 10 mg by mouth daily. 12/09/19   Bensimhon, Shaune Pascal, MD  digoxin (LANOXIN) 0.125 MG tablet Take 1 tablet (0.125 mg total) by mouth daily. 06/14/20   Lyda Jester M, PA-C  folic acid (FOLVITE) 1 MG tablet Take 1 tablet (1 mg total) by mouth daily. 03/16/20   Bensimhon, Shaune Pascal, MD  ivabradine (CORLANOR) 7.5 MG TABS tablet Take 1 tablet (7.5 mg total) by mouth 2 (two) times daily with a meal. 03/27/20   Bensimhon, Shaune Pascal, MD  ondansetron (ZOFRAN ODT) 4 MG disintegrating tablet Take 1 tablet (4 mg total) by mouth every 8 (eight) hours as needed for nausea or vomiting. 08/09/20   Nche, Charlene Brooke, NP  pantoprazole (PROTONIX) 40 MG tablet Take  1 tablet (40 mg total) by mouth 2 (two) times daily. 07/31/20   Bonnielee Haff, MD  potassium chloride (KLOR-CON) 20 MEQ packet Take 20 mEq by mouth daily.    [provider]  QUEtiapine (SEROQUEL) 100 MG tablet Take 1.5 tablets (150 mg total) by mouth daily. 06/23/20   Nche, Charlene Brooke, NP  sacubitril-valsartan (ENTRESTO) 97-103 MG Take 1 tablet by mouth 2 (two) times daily. 06/16/20   Consuelo Pandy, PA-C  spironolactone (ALDACTONE) 25 MG tablet Take 1 tablet (25 mg total) by mouth daily. HOLD until 03/26/20  03/24/20   Desiree Hane, MD  sucralfate (CARAFATE) 1 g tablet Take 1 tablet (1 g total) by mouth 4 (four) times daily -  with meals and at bedtime. 07/31/20   Bonnielee Haff, MD  thiamine 100 MG tablet Take 1 tablet (100 mg total) by mouth daily. 07/31/20   Bonnielee Haff, MD    Physical Exam: Vitals:   08/19/20 0555 08/19/20 0557 08/19/20 0600 08/19/20 0653  BP: (!) 141/88   (!) 142/85  Pulse: 95  (!) 102 89  Resp:   15 17  Temp:  98.5 F (36.9 C)    TempSrc:  Oral    SpO2:   97% 100%  Weight:      Height:        Constitutional: NAD, calm, comfortable Vitals:   08/19/20 0555 08/19/20 0557 08/19/20 0600 08/19/20 0653  BP: (!) 141/88   (!) 142/85  Pulse: 95  (!) 102 89  Resp:   15 17  Temp:  98.5 F (36.9 C)    TempSrc:  Oral    SpO2:   97% 100%  Weight:      Height:       Eyes: PERRL, lids and conjunctivae normal ENMT: Mucous membranes are moist. Posterior pharynx clear of any exudate or lesions.Normal dentition.  Neck: normal, supple, no masses, no thyromegaly Respiratory: clear to auscultation bilaterally, no wheezing, no crackles. Normal respiratory effort. No accessory muscle use.  Cardiovascular: Regular rate and rhythm, no murmurs / rubs / gallops. No extremity edema. 2+ pedal pulses. No carotid bruits.  Abdomen: no tenderness, no masses palpated. No hepatosplenomegaly. Bowel sounds positive.  Musculoskeletal: no clubbing / cyanosis. No joint deformity upper and lower extremities. Good ROM, no contractures. Normal muscle tone.  Skin: no rashes, lesions, ulcers. No induration Neurologic: CN 2-12 grossly intact. Sensation intact, DTR normal. Strength 5/5 in all 4.  Psychiatric: Normal judgment and insight. Alert and oriented x 3. Normal mood.    Labs on Admission: I have personally reviewed following labs and imaging studies  CBC: Recent Labs  Lab 08/19/20 0501  WBC 11.3*  HGB 13.5  HCT 40.6  MCV 90.0  PLT 496   Basic Metabolic Panel: Recent Labs  Lab  08/19/20 0501 08/19/20 0535  NA 141  --   K 4.0  --   CL 89*  --   CO2 28  --   GLUCOSE 181*  --   BUN 16  --   CREATININE 2.15*  --   CALCIUM 10.1  --   MG  --  1.4*   GFR: Estimated Creatinine Clearance: 48.1 mL/min (A) (by C-G formula based on SCr of 2.15 mg/dL (H)). Liver Function Tests: No results for input(s): AST, ALT, ALKPHOS, BILITOT, PROT, ALBUMIN in the last 168 hours. Recent Labs  Lab 08/19/20 0535  LIPASE 31   No results for input(s): AMMONIA in the last 168 hours. Coagulation Profile: No results  for input(s): INR, PROTIME in the last 168 hours. Cardiac Enzymes: No results for input(s): CKTOTAL, CKMB, CKMBINDEX, TROPONINI in the last 168 hours. BNP (last 3 results) No results for input(s): PROBNP in the last 8760 hours. HbA1C: No results for input(s): HGBA1C in the last 72 hours. CBG: No results for input(s): GLUCAP in the last 168 hours. Lipid Profile: No results for input(s): CHOL, HDL, LDLCALC, TRIG, CHOLHDL, LDLDIRECT in the last 72 hours. Thyroid Function Tests: No results for input(s): TSH, T4TOTAL, FREET4, T3FREE, THYROIDAB in the last 72 hours. Anemia Panel: No results for input(s): VITAMINB12, FOLATE, FERRITIN, TIBC, IRON, RETICCTPCT in the last 72 hours. Urine analysis:    Component Value Date/Time   COLORURINE YELLOW 07/28/2020 0005   APPEARANCEUR CLEAR 07/28/2020 0005   LABSPEC 1.023 07/28/2020 0005   PHURINE 5.0 07/28/2020 0005   GLUCOSEU >=500 (A) 07/28/2020 0005   HGBUR SMALL (A) 07/28/2020 0005   BILIRUBINUR NEGATIVE 07/28/2020 0005   KETONESUR 20 (A) 07/28/2020 0005   PROTEINUR 100 (A) 07/28/2020 0005   NITRITE NEGATIVE 07/28/2020 0005   LEUKOCYTESUR NEGATIVE 07/28/2020 0005   Sepsis Labs: !!!!!!!!!!!!!!!!!!!!!!!!!!!!!!!!!!!!!!!!!!!! @LABRCNTIP (procalcitonin:4,lacticidven:4) )No results found for this or any previous visit (from the past 240 hour(s)).   Radiological Exams on Admission: No results  found.   Assessment/Plan  40 year old male with acute kidney injury secondary to nausea and vomiting  Principal Problem:    AKI (acute kidney injury) (HCC)-mild renal injury with a creatinine of 2.  IV fluids.  Repeat creatinine in the morning.  Potassium levels normal.  Has no further nausea vomiting at this time.  Active Problems:    Hypomagnesemia-replete and repeat in the morning    Chronic systolic CHF (congestive heart failure) (HCC)-currently compensated and stable at this time    Polysubstance abuse (HCC)-placed on CIWA protocol    DVT prophylaxis: Ambulate Code Status: Full Family Communication: Mom at bedside Disposition Plan: Tomorrow Consults called: None Admission status: Observation   Sabre Leonetti A MD [facility]  If 7PM-7AM, please contact night-coverage [email or web address] Password [facility]  08/19/2020, 7:32 AM

## 2020-08-19 NOTE — ED Triage Notes (Signed)
Pt reports mid chest pain and pain to sides. Pt has tremors in triage. Pain is described as dull. History of CHF.

## 2020-08-19 NOTE — Progress Notes (Signed)
Paged [phone] Dr Arrien regarding no home meds noted on pt admission orders.  

## 2020-08-19 NOTE — ED Notes (Signed)
Pt asking for something to drink. Sent provider a message reference same

## 2020-08-19 NOTE — Plan of Care (Signed)

## 2020-08-19 NOTE — Progress Notes (Signed)
Sent chat message to Dr David regarding no home meds noted on pt admission orders. 

## 2020-08-19 NOTE — ED Provider Notes (Signed)
[name] EMERGENCY DEPARTMENT Provider Note   CSN: [id number] Arrival date & time: 08/19/20  0435   History Chief complaint: Generalized pain  Robert [name] is a 40 y.o. male.  The history is provided by the patient.  He has history of chronic systolic heart failure, substance abuse including alcohol and comes in complaining of hurting everywhere.  He states it started sometime earlier today but cannot be more specific.  There was associated nausea and vomiting and he vomited at least 5 times.  There was no blood in his emesis.  He denies constipation or diarrhea.  He denies any dyspnea.  He was not able to take anything for pain because of the nausea and vomiting.  He denies hallucinations although he states he has had hallucinations in the past.  He states he had not had anything to drink in the last 2 days and denies any drug use.  Past Medical History:  Diagnosis Date  . Alcohol use   . Alcohol withdrawal ([place]) 03/06/2020  . Asthma   . CHF (congestive heart failure) [facility])     Patient Active Problem List   Diagnosis Date Noted  . Seizure ([place]) 07/10/2020  . Mood disorder ([place]) 05/16/2020  . Hypotension 03/24/2020  . Polysubstance abuse ([place]) 03/24/2020  . Nausea & vomiting 03/06/2020  . AKI (acute kidney injury) ([place]) 10/30/2019  . Gastritis 10/30/2019  . Elevated INR 10/30/2019  . 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 Acute heart failure ([place]) 10/05/2019  . Pulmonary embolism ([place]) 10/04/2019  . Acute pulmonary embolism ([place]) 10/04/2019  . Chronic systolic CHF (congestive heart failure) ([place])   . Inappropriate sinus tachycardia   . Chest pain 11/03/2013  . Tobacco abuse 11/03/2013    Past Surgical History:  Procedure Laterality Date  . ESOPHAGOGASTRODUODENOSCOPY (EGD) WITH PROPOFOL N/A 11/01/2019   Procedure: ESOPHAGOGASTRODUODENOSCOPY (EGD) WITH PROPOFOL;  Surgeon: [name], MD;  Location: [facility] ENDOSCOPY;   Service: Endoscopy;  Laterality: N/A;  . RIGHT/LEFT HEART CATH AND CORONARY ANGIOGRAPHY N/A 10/06/2019   Procedure: RIGHT/LEFT HEART CATH AND CORONARY ANGIOGRAPHY;  Surgeon: [name], MD;  Location: [place];  Service: Cardiovascular;  Laterality: N/A;  . WRIST SURGERY         Family History  Problem Relation Age of Onset  . Heart failure Father   . Diabetes Father   . Kidney disease Father   . Heart disease Father 55  . Heart disease Maternal Grandfather 33  . Heart disease Paternal Grandfather 78    Social History   Tobacco Use  . Smoking status: Current Every Day Smoker    Packs/day: 0.25    Years: 24.00    Pack years: 6.00    Types: Cigarettes  . Smokeless tobacco: Never Used  Vaping Use  . Vaping Use: Former  Substance Use Topics  . Alcohol use: Yes    Alcohol/week: 8.0 standard drinks    Types: 8 Shots of liquor per week    Comment: heavy use since age 82  . Drug use: Yes    Frequency: 1.0 times per week    Types: Marijuana    Home Medications Prior to Admission medications   Medication Sig Start Date End Date Taking? Authorizing Provider  carvedilol (COREG) 3.125 MG tablet Take 1 tablet (3.125 mg total) by mouth 2 (two) times daily. 04/11/20 04/11/21  Clegg, Amy D, NP  chlordiazePOXIDE (LIBRIUM) 5 MG capsule Take 1 capsule (5 mg  total) by mouth 3 (three) times daily as needed for anxiety. 08/09/20   Nche, Charlene Brooke, NP  dapagliflozin propanediol (FARXIGA) 10 MG TABS tablet Take 1 tablet (10 mg total) by mouth daily before breakfast. Patient taking differently: Take 10 mg by mouth daily. 12/09/19   Bensimhon, Shaune Pascal, MD  digoxin (LANOXIN) 0.125 MG tablet Take 1 tablet (0.125 mg total) by mouth daily. 06/14/20   Lyda Jester M, PA-C  folic acid (FOLVITE) 1 MG tablet Take 1 tablet (1 mg total) by mouth daily. 03/16/20   Bensimhon, Shaune Pascal, MD  ivabradine (CORLANOR) 7.5 MG TABS tablet Take 1 tablet (7.5 mg total) by mouth 2 (two) times daily  with a meal. 03/27/20   Bensimhon, Shaune Pascal, MD  ondansetron (ZOFRAN ODT) 4 MG disintegrating tablet Take 1 tablet (4 mg total) by mouth every 8 (eight) hours as needed for nausea or vomiting. 08/09/20   Nche, Charlene Brooke, NP  pantoprazole (PROTONIX) 40 MG tablet Take 1 tablet (40 mg total) by mouth 2 (two) times daily. 07/31/20   Bonnielee Haff, MD  potassium chloride (KLOR-CON) 20 MEQ packet Take 20 mEq by mouth daily.    [provider]  QUEtiapine (SEROQUEL) 100 MG tablet Take 1.5 tablets (150 mg total) by mouth daily. 06/23/20   Nche, Charlene Brooke, NP  sacubitril-valsartan (ENTRESTO) 97-103 MG Take 1 tablet by mouth 2 (two) times daily. 06/16/20   Consuelo Pandy, PA-C  spironolactone (ALDACTONE) 25 MG tablet Take 1 tablet (25 mg total) by mouth daily. HOLD until 03/26/20 03/24/20   Desiree Hane, MD  sucralfate (CARAFATE) 1 g tablet Take 1 tablet (1 g total) by mouth 4 (four) times daily -  with meals and at bedtime. 07/31/20   Bonnielee Haff, MD  thiamine 100 MG tablet Take 1 tablet (100 mg total) by mouth daily. 07/31/20   Bonnielee Haff, MD    Allergies    Bactrim [sulfamethoxazole-trimethoprim]  Review of Systems   Review of Systems  All other systems reviewed and are negative.   Physical Exam Updated Vital Signs BP (!) 157/108 (BP Location: Right Arm)   Pulse (!) 125   Temp 98.1 F (36.7 C) (Oral)   Resp (!) 33   Ht 5\' 10"  (1.778 m)   Wt 86.2 kg   SpO2 93%   BMI 27.26 kg/m   Physical Exam Vitals and nursing note reviewed.   41 year old male, resting comfortably and in no acute distress. Vital signs are significant for elevated heart rate, respiratory rate, blood pressure. Oxygen saturation is 93%, which is normal. Head is normocephalic and atraumatic. PERRLA, EOMI. Oropharynx is clear. Neck is nontender and supple without adenopathy or JVD. Back is nontender and there is no CVA tenderness. Lungs are clear without rales, wheezes, or rhonchi. Chest is  nontender. Heart has regular rate and rhythm without murmur. Abdomen is soft, flat, nontender without masses or hepatosplenomegaly and peristalsis is hypoactive. Extremities have no cyanosis or edema, full range of motion is present. Skin is warm and dry without rash. Neurologic: Mental status is normal, cranial nerves are intact, moves all extremities equally.  Mild to moderate tremulousness is present.  ED Results / Procedures / Treatments   Labs (all labs ordered are listed, but only abnormal results are displayed) Labs Reviewed  BASIC METABOLIC PANEL - Abnormal; Notable for the following components:      Result Value   Chloride 89 (*)    Glucose, Bld 181 (*)    Creatinine,  Ser 2.15 (*)    GFR, Estimated 39 (*)    Anion gap 24 (*)    All other components within normal limits  CBC - Abnormal; Notable for the following components:   WBC 11.3 (*)    All other components within normal limits  MAGNESIUM - Abnormal; Notable for the following components:   Magnesium 1.4 (*)    All other components within normal limits  SARS CORONAVIRUS 2 (TAT 6-24 HRS)  ETHANOL  LIPASE, BLOOD    EKG DVONTAE, RUAN GH:[id number] 19-Aug-2020 04:46:17 [facility][id number] ROUTINE RECORD [phone] (26 yr) Male Black Room:[id number] Loc:506 Technician: [phone] Test HEN:IDPOE Pain Vent. rate 126 BPM PR interval 154 ms QRS duration 74 ms QT/QTcB 408/590 ms P-R-T axes 72 62 108 ** Critical Test Result: Long QTc Sinus tachycardia Biatrial enlargement ST & Marked T wave abnormality, consider anterolateral ischemia Abnormal ECG When compared with ECG of 07/28/2020, QT has lengthened T wave abnormality is more pronounced in anterior leads Confirmed by Delora Fuel ([id number]) on 08/19/2020 4:50:43 AM Confirmed By: Delora Fuel  EKG Interpretation  Date/Time:  Saturday August 19 2020 04:55:23 EDT Ventricular Rate:  99 PR Interval:  158 QRS Duration: 88 QT Interval:  459 QTC Calculation: 590 R  Axis:   58 Text Interpretation: Sinus rhythm Biatrial enlargement Abnormal T, probable ischemia, widespread Prolonged QT interval When compared with ECG of EARLIER SAME DATE No significant change was found Confirmed by Delora Fuel ([id number]) on 08/19/2020 5:31:28 AM  Procedures Procedures  CRITICAL CARE Performed by: Delora Fuel Total critical care time: 35 minutes Critical care time was exclusive of separately billable procedures and treating other patients. Critical care was necessary to treat or prevent imminent or life-threatening deterioration. Critical care was time spent personally by me on the following activities: development of treatment plan with patient and/or surrogate as well as nursing, discussions with consultants, evaluation of patient's response to treatment, examination of patient, obtaining history from patient or surrogate, ordering and performing treatments and interventions, ordering and review of laboratory studies, ordering and review of radiographic studies, pulse oximetry and re-evaluation of patient's condition.  Medications Ordered in ED Medications  LORazepam (ATIVAN) injection 0-4 mg (0 mg Intravenous Not Given 08/19/20 0741)    Or  LORazepam (ATIVAN) tablet 0-4 mg ( Oral See Alternative 08/19/20 0741)  LORazepam (ATIVAN) injection 0-4 mg (has no administration in time range)    Or  LORazepam (ATIVAN) tablet 0-4 mg (has no administration in time range)  thiamine tablet 100 mg (has no administration in time range)    Or  thiamine (B-1) injection 100 mg (has no administration in time range)  magnesium sulfate IVPB 2 g 50 mL (has no administration in time range)  lactated ringers bolus 1,000 mL (1,000 mLs Intravenous New Bag/Given 08/19/20 0553)  morphine 4 MG/ML injection 4 mg (4 mg Intravenous Given 08/19/20 0552)  ondansetron (ZOFRAN) injection 4 mg (4 mg Intravenous Given 08/19/20 0551)    ED Course  I have reviewed the triage vital signs and the nursing  notes.  Pertinent labs & imaging results that were available during my care of the patient were reviewed by me and considered in my medical decision making (see chart for details).  MDM Rules/Calculators/A&P Generalized pain, etiology unclear.  Tremulousness which probably represents alcohol withdrawal.  Old records were reviewed, and it is noted that he has had 2 recent admissions which were both likely related to alcohol.  He is placed on the CIWA protocol and given  IV fluids as well as morphine for pain and ondansetron for nausea.  ECG shows fairly deep T wave inversions in the anterior leads which are more pronounced than prior, also newly prolonged QT interval.  Will check magnesium level.  Magnesium level is slightly low and is given intravenous magnesium.  Labs show creatinine of 2.15 which is an increase from his creatinine at discharge on 07/31/2020 which was 1.20.  Curiously, BUN is actually lower today than it was on discharge.  Remainder of lab work is unremarkable.  He he does feel better following IV fluids and morphine and lorazepam.  Case is discussed with Dr. Shanon Brow of [facility], who agrees to admit the patient.  Final Clinical Impression(s) / ED Diagnoses Final diagnoses:  Acute kidney injury (nontraumatic) (HCC)  Alcohol withdrawal syndrome without complication ([place])  Hypomagnesemia  Prolonged Q-T interval on ECG    Rx / DC Orders ED Discharge Orders    None       Delora Fuel, MD 75/91/63 [phone]

## 2020-08-20 DIAGNOSIS — N179 Acute kidney failure, unspecified: Secondary | ICD-10-CM | POA: Diagnosis not present

## 2020-08-20 DIAGNOSIS — I5022 Chronic systolic (congestive) heart failure: Secondary | ICD-10-CM

## 2020-08-20 DIAGNOSIS — F1910 Other psychoactive substance abuse, uncomplicated: Secondary | ICD-10-CM | POA: Diagnosis not present

## 2020-08-20 LAB — CBC
HCT: 33.1 % — ABNORMAL LOW (ref 39.0–52.0)
Hemoglobin: 11 g/dL — ABNORMAL LOW (ref 13.0–17.0)
MCH: 30.8 pg (ref 26.0–34.0)
MCHC: 33.2 g/dL (ref 30.0–36.0)
MCV: 92.7 fL (ref 80.0–100.0)
Platelets: 173 10*3/uL (ref 150–400)
RBC: 3.57 MIL/uL — ABNORMAL LOW (ref 4.22–5.81)
RDW: 14.1 % (ref 11.5–15.5)
WBC: 6 10*3/uL (ref 4.0–10.5)
nRBC: 0 % (ref 0.0–0.2)

## 2020-08-20 LAB — BASIC METABOLIC PANEL
Anion gap: 9 (ref 5–15)
BUN: 13 mg/dL (ref 6–20)
CO2: 32 mmol/L (ref 22–32)
Calcium: 8.7 mg/dL — ABNORMAL LOW (ref 8.9–10.3)
Chloride: 94 mmol/L — ABNORMAL LOW (ref 98–111)
Creatinine, Ser: 1.42 mg/dL — ABNORMAL HIGH (ref 0.61–1.24)
GFR, Estimated: >60 mL/min (ref >60)
Glucose, Bld: 91 mg/dL (ref 70–99)
Potassium: 3.3 mmol/L — ABNORMAL LOW (ref 3.5–5.1)
Sodium: 135 mmol/L (ref 135–145)

## 2020-08-20 LAB — MAGNESIUM: Magnesium: 1.8 mg/dL (ref 1.7–2.4)

## 2020-08-20 MED ORDER — POTASSIUM CHLORIDE CRYS ER 20 MEQ PO TBCR
40.0000 meq | EXTENDED_RELEASE_TABLET | Freq: Once | ORAL | Status: AC
  Administered 2020-08-20: 40 meq via ORAL
  Filled 2020-08-20: qty 2

## 2020-08-20 MED ORDER — LORAZEPAM 1 MG PO TABS: 1.0000 mg | ORAL_TABLET | Freq: Four times a day (QID) | ORAL | 0 refills | Status: DC | PRN

## 2020-08-20 MED ORDER — ONDANSETRON 4 MG PO TBDP: 4.0000 mg | ORAL_TABLET | Freq: Three times a day (TID) | ORAL | 0 refills | Status: DC | PRN

## 2020-08-20 MED ORDER — PANTOPRAZOLE SODIUM 40 MG PO TBEC: 40.0000 mg | DELAYED_RELEASE_TABLET | Freq: Every day | ORAL | 0 refills | Status: DC

## 2020-08-20 MED ORDER — ONDANSETRON HCL 4 MG PO TABS: 4.0000 mg | ORAL_TABLET | Freq: Three times a day (TID) | ORAL | Status: DC | PRN

## 2020-08-20 MED ORDER — LORAZEPAM 1 MG PO TABS: 1.0000 mg | ORAL_TABLET | ORAL | Status: DC | PRN

## 2020-08-20 MED ORDER — MAGNESIUM OXIDE 400 (241.3 MG) MG PO TABS
400.0000 mg | ORAL_TABLET | Freq: Every day | ORAL | Status: DC
  Administered 2020-08-20: 400 mg via ORAL
  Filled 2020-08-20: qty 1

## 2020-08-20 MED ORDER — PANTOPRAZOLE SODIUM 40 MG PO TBEC: 40.0000 mg | DELAYED_RELEASE_TABLET | Freq: Every day | ORAL | Status: DC

## 2020-08-20 NOTE — Discharge Summary (Signed)
Physician Discharge Summary  Robert [name] LNL:[id number] DOB: 1981-01-02 DOA: 08/19/2020  PCP: Flossie Buffy, NP  Admit date: 08/19/2020 Discharge date: 08/20/2020  Admitted From: Home  Disposition:  Home   Recommendations for Outpatient Follow-up and new medication changes:  1. Follow up with [name] NP in 7 to 10 days.  2. Patient has been placed on as needed lorazepam for alcohol withdrawal. Advice to avoid alcohol consumption.  3. Added pantoprazole for GI prophylaxis and as needed zofran for nausea.   Home Health: no   Equipment/Devices: no    Discharge Condition: stable  CODE STATUS: full  Diet recommendation:  Heart healthy   Brief/Interim Summary: [name] was admitted to the hospital with the working diagnosis of acute kidney injury in the setting of alcohol withdrawal syndrome.   40 year old male past medical history for polysubstance/ alcohol abuse heart failure (non ischemic cardiomyopathy) and PE who presented with generalized body aches.  Reported not feeling well for the about 24 hours, positive anxiety and tremors. He had severe vomiting about 5 episodes.  Patient drinks 8 standard alcohol beverages per week and smokes THC once per week. His last drink was about 4 days before presenting to the ED.  On his initial physical examination, blood pressure 141/88, heart rate 102, respiratory rate 15, temperature 98.5, oxygen saturation 97%, his lungs were clear to auscultation bilaterally, heart S1-S2, present rhythmic, abdomen soft nontender, no lower extremity edema, patient had no tremors, confusion or agitation.  Sodium 141, potassium 4.0, chloride 89, bicarb 28, glucose 181, BUN 16, creatinine 2.15, magnesium 1.4, white count 11.3, hemoglobin 13.5, hematocrit 40.6, platelets 301. SARS COVID-19 negative. Alcohol level less than 10.  EKG 126 bpm, normal axis, QTC manually corrected 463, sinus rhythm, poor R wave progression, no ST segment changes, inferior  lateral T wave inversion, V3-V6, positive LVH.  1. AKI on CKD stage 2 (base cr around 1,2)  hypokalemia/hypomagnesemia..  Patient received intravenous fluids with improvement of his kidney function.  At discharge sodium 135, potassium 3.3, chloride 94, bicarb 32, glucose 91, BUN 13, creatinine 1.42, magnesium 1.8.  Patient received potassium chloride and magnesium supplementation.  No further nausea, vomiting, tolerating p.o. diet adequately.  2.  Alcohol withdrawal syndrome.  Patient received benzodiazepines, lorazepam per CIWA protocol, with significant improvement of his symptoms. Last dose of lorazepam was about 12 hours ago. Clinically no further tremors or anxiety.  Patient was advised about alcohol cessation, continue as needed lorazepam at home.  3.  Chronic systolic heart failure, nonischemic cardiomyopathy.  Echocardiography from 06/2020, left ventricle ejection fraction 30%, global hypokinesis.  Internal cavity severely dilated.  Right ventricle systolic function preserved.  Patient will continue heart failure management with Entresto, carvedilol, spironolactone and dapagliflozin. On digoxin and ivabradine.   He follows up with the heart failure clinic, Dr. Haroldine Laws.   4.  History of GI bleed, gastritis.  No signs of recurrent bleeding, will add pantoprazole for GI prophylaxis.  5.  History of pulmonary embolism.  Currently not anticoagulated due to history of GI bleed and increased bleeding risk.  6. Depression. Continue with quetiapine and sertralone.     Discharge Diagnoses:  Principal Problem:   AKI (acute kidney injury) ([place]) Active Problems:   Chronic systolic CHF (congestive heart failure) ([facility])   Polysubstance abuse ([place])   Hypomagnesemia    Discharge Instructions   Allergies as of 08/20/2020      Reactions   Bactrim [sulfamethoxazole-trimethoprim] Rash      Medication List  STOP taking these medications   chlordiazePOXIDE 5 MG capsule Commonly  known as: LIBRIUM     TAKE these medications   carvedilol 3.125 MG tablet Commonly known as: Coreg Take 1 tablet (3.125 mg total) by mouth 2 (two) times daily.   dapagliflozin propanediol 10 MG Tabs tablet Commonly known as: Farxiga Take 1 tablet (10 mg total) by mouth daily before breakfast. What changed: when to take this   digoxin 0.125 MG tablet Commonly known as: LANOXIN Take 1 tablet (0.125 mg total) by mouth daily.   Entresto 97-103 MG Generic drug: sacubitril-valsartan Take 1 tablet by mouth 2 (two) times daily.   folic acid 1 MG tablet Commonly known as: FOLVITE Take 1 tablet (1 mg total) by mouth daily.   ivabradine 7.5 MG Tabs tablet Commonly known as: CORLANOR Take 1 tablet (7.5 mg total) by mouth 2 (two) times daily with a meal.   LORazepam 1 MG tablet Commonly known as: ATIVAN Take 1 tablet (1 mg total) by mouth every 6 (six) hours as needed for anxiety.   ondansetron 4 MG disintegrating tablet Commonly known as: Zofran ODT Take 1 tablet (4 mg total) by mouth every 8 (eight) hours as needed for nausea or vomiting.   pantoprazole 40 MG tablet Commonly known as: PROTONIX Take 1 tablet (40 mg total) by mouth daily. What changed: when to take this   potassium chloride SA 20 MEQ tablet Commonly known as: KLOR-CON Take 20 mEq by mouth daily.   QUEtiapine 100 MG tablet Commonly known as: SEROQUEL Take 1.5 tablets (150 mg total) by mouth daily.   sertraline 25 MG tablet Commonly known as: ZOLOFT Take 25 mg by mouth daily as needed (anxiety).   spironolactone 25 MG tablet Commonly known as: ALDACTONE Take 1 tablet (25 mg total) by mouth daily. HOLD until 03/26/20   sucralfate 1 g tablet Commonly known as: Carafate Take 1 tablet (1 g total) by mouth 4 (four) times daily -  with meals and at bedtime.   thiamine 100 MG tablet Take 1 tablet (100 mg total) by mouth daily.       Allergies  Allergen Reactions  . Bactrim  [Sulfamethoxazole-Trimethoprim] Rash        Procedures/Studies: CT Abdomen Pelvis Wo Contrast  Result Date: 07/29/2020 CLINICAL DATA:  Left side chest and upper abdominal pain. EXAM: CT ABDOMEN AND PELVIS WITHOUT CONTRAST TECHNIQUE: Multidetector CT imaging of the abdomen and pelvis was performed following the standard protocol without IV contrast. COMPARISON:  None. FINDINGS: Lower chest: Thin walled cyst noted in the left lower lobe with air-fluid level. Adjacent scarring. No effusions. Heart is normal size. Hepatobiliary: Severe diffuse low-density throughout the liver compatible with fatty infiltration. Gallbladder unremarkable. Pancreas: No focal abnormality or ductal dilatation. Spleen: No focal abnormality.  Normal size. Adrenals/Urinary Tract: No adrenal abnormality. No focal renal abnormality. No stones or hydronephrosis. Urinary bladder is unremarkable. Stomach/Bowel: Normal appendix. Diffuse low-density throughout the colonic wall may reflect fatty proliferation from prior/remote inflammatory bowel disease. No current wall thickening. Stomach and small bowel decompressed, grossly unremarkable. No bowel obstruction. Vascular/Lymphatic: No evidence of aneurysm or adenopathy. Reproductive: No visible focal abnormality. Other: No free fluid or free air. Musculoskeletal: No acute bony abnormality. IMPRESSION: Severe diffuse fatty infiltration of the liver. Thin walled air-filled cyst in the left lower lobe posteriorly with small air-fluid level. Is difficult to determine if this represents bronchiectasis or other cause of thin walled cyst. Chest CT may be helpful for further evaluation. No acute findings  in the abdomen or pelvis. Electronically Signed   By: Rolm Baptise M.D.   On: 07/29/2020 00:00   DG Chest Port 1 View  Result Date: 07/28/2020 CLINICAL DATA:  Chest pain EXAM: PORTABLE CHEST 1 VIEW COMPARISON:  07/10/2020 FINDINGS: Heart and mediastinal contours are within normal limits. No focal  opacities or effusions. No acute bony abnormality. IMPRESSION: No active disease. Electronically Signed   By: Rolm Baptise M.D.   On: 07/28/2020 21:59        Subjective: Patient is feeling better, no further nausea or vomiting, no chest pain, tremors have improved, no dyspnea or edema.   Discharge Exam: Vitals:   08/20/20 0041 08/20/20 0509  BP: (!) 132/93 130/90  Pulse: 86 84  Resp: 18 18  Temp: 98.2 F (36.8 C) 98.5 F (36.9 C)  SpO2: 98% 98%   Vitals:   08/19/20 1951 08/19/20 2317 08/20/20 0041 08/20/20 0509  BP: 135/89  (!) 132/93 130/90  Pulse: 87 86 86 84  Resp: 19  18 18   Temp: 98.7 F (37.1 C)  98.2 F (36.8 C) 98.5 F (36.9 C)  TempSrc: Oral  Oral Oral  SpO2: 99%  98% 98%  Weight:      Height:        General: Not in pain or dyspnea  Neurology: Awake and alert, non focal  E ENT: no pallor, no icterus, oral mucosa moist Cardiovascular: No JVD. S1-S2 present, rhythmic, no gallops, rubs, or murmurs. No lower extremity edema. Pulmonary: vesicular breath sounds bilaterally, adequate air movement, no wheezing, rhonchi or rales. Gastrointestinal. Abdomen soft and non tender Skin. No rashes Musculoskeletal: no joint deformities   The results of significant diagnostics from this hospitalization (including imaging, microbiology, ancillary and laboratory) are listed below for reference.     Microbiology: Recent Results (from the past 240 hour(s))  SARS CORONAVIRUS 2 (TAT 6-24 HRS) Nasopharyngeal Nasopharyngeal Swab     Status: None   Collection Time: 08/19/20  9:37 AM   Specimen: Nasopharyngeal Swab  Result Value Ref Range Status   SARS Coronavirus 2 NEGATIVE NEGATIVE Final    Comment: (NOTE) SARS-CoV-2 target nucleic acids are NOT DETECTED.  The SARS-CoV-2 RNA is generally detectable in upper and lower respiratory specimens during the acute phase of infection. Negative results do not preclude SARS-CoV-2 infection, do not rule out co-infections with other  pathogens, and should not be used as the sole basis for treatment or other patient management decisions. Negative results must be combined with clinical observations, patient history, and epidemiological information. The expected result is Negative.  Fact Sheet for Patients: SugarRoll.be  Fact Sheet for Healthcare Providers: https://www.woods-mathews.com/  This test is not yet approved or cleared by the [place] FDA and  has been authorized for detection and/or diagnosis of SARS-CoV-2 by FDA under an Emergency Use Authorization (EUA). This EUA will remain  in effect (meaning this test can be used) for the duration of the COVID-19 declaration under Se ction 564(b)(1) of the Act, 21 U.S.C. section 360bbb-3(b)(1), unless the authorization is terminated or revoked sooner.  Performed at [facility], [place] [address], [place], [place] [address]      Labs: BNP (last 3 results) Recent Labs    12/30/19 1016 03/23/20 0513 07/28/20 2141  BNP 262.2* 87.4 43.3   Basic Metabolic Panel: Recent Labs  Lab 08/19/20 0501 08/19/20 0535 08/19/20 1055 08/20/20 0303  NA 141  --   --  135  K 4.0  --   --  3.3*  CL 89*  --   --  94*  CO2 28  --   --  32  GLUCOSE 181*  --   --  91  BUN 16  --   --  13  CREATININE 2.15*  --   --  1.42*  CALCIUM 10.1  --   --  8.7*  MG  --  1.4*  --  1.8  PHOS  --   --  2.3*  --    Liver Function Tests: No results for input(s): AST, ALT, ALKPHOS, BILITOT, PROT, ALBUMIN in the last 168 hours. Recent Labs  Lab 08/19/20 0535  LIPASE 31   No results for input(s): AMMONIA in the last 168 hours. CBC: Recent Labs  Lab 08/19/20 0501 08/20/20 0303  WBC 11.3* 6.0  HGB 13.5 11.0*  HCT 40.6 33.1*  MCV 90.0 92.7  PLT 301 173   Cardiac Enzymes: No results for input(s): CKTOTAL, CKMB, CKMBINDEX, TROPONINI in the last 168 hours. BNP: Invalid input(s): POCBNP CBG: No results for input(s): GLUCAP in the  last 168 hours. D-Dimer No results for input(s): DDIMER in the last 72 hours. Hgb A1c No results for input(s): HGBA1C in the last 72 hours. Lipid Profile No results for input(s): CHOL, HDL, LDLCALC, TRIG, CHOLHDL, LDLDIRECT in the last 72 hours. Thyroid function studies No results for input(s): TSH, T4TOTAL, T3FREE, THYROIDAB in the last 72 hours.  Invalid input(s): FREET3 Anemia work up No results for input(s): VITAMINB12, FOLATE, FERRITIN, TIBC, IRON, RETICCTPCT in the last 72 hours. Urinalysis    Component Value Date/Time   COLORURINE YELLOW 07/28/2020 0005   APPEARANCEUR CLEAR 07/28/2020 0005   LABSPEC 1.023 07/28/2020 0005   PHURINE 5.0 07/28/2020 0005   GLUCOSEU >=500 (A) 07/28/2020 0005   HGBUR SMALL (A) 07/28/2020 0005   BILIRUBINUR NEGATIVE 07/28/2020 0005   KETONESUR 20 (A) 07/28/2020 0005   PROTEINUR 100 (A) 07/28/2020 0005   NITRITE NEGATIVE 07/28/2020 0005   LEUKOCYTESUR NEGATIVE 07/28/2020 0005   Sepsis Labs Invalid input(s): PROCALCITONIN,  WBC,  LACTICIDVEN Microbiology Recent Results (from the past 240 hour(s))  SARS CORONAVIRUS 2 (TAT 6-24 HRS) Nasopharyngeal Nasopharyngeal Swab     Status: None   Collection Time: 08/19/20  9:37 AM   Specimen: Nasopharyngeal Swab  Result Value Ref Range Status   SARS Coronavirus 2 NEGATIVE NEGATIVE Final    Comment: (NOTE) SARS-CoV-2 target nucleic acids are NOT DETECTED.  The SARS-CoV-2 RNA is generally detectable in upper and lower respiratory specimens during the acute phase of infection. Negative results do not preclude SARS-CoV-2 infection, do not rule out co-infections with other pathogens, and should not be used as the sole basis for treatment or other patient management decisions. Negative results must be combined with clinical observations, patient history, and epidemiological information. The expected result is Negative.  Fact Sheet for Patients: SugarRoll.be  Fact Sheet for  Healthcare Providers: https://www.woods-mathews.com/  This test is not yet approved or cleared by the [place] FDA and  has been authorized for detection and/or diagnosis of SARS-CoV-2 by FDA under an Emergency Use Authorization (EUA). This EUA will remain  in effect (meaning this test can be used) for the duration of the COVID-19 declaration under Se ction 564(b)(1) of the Act, 21 U.S.C. section 360bbb-3(b)(1), unless the authorization is terminated or revoked sooner.  Performed at [facility], [place] [address], [place],  [address]      Time coordinating discharge: 45 minutes  SIGNED:   Tawni Millers,  MD  [facility] 08/20/2020, 9:25 AM

## 2020-08-24 ENCOUNTER — Encounter (HOSPITAL_COMMUNITY): Payer: BC Managed Care – PPO

## 2020-08-28 ENCOUNTER — Inpatient Hospital Stay (HOSPITAL_COMMUNITY): Admission: RE | Admit: 2020-08-28 | Payer: BC Managed Care – PPO | Source: Ambulatory Visit

## 2020-09-01 ENCOUNTER — Other Ambulatory Visit: Payer: Self-pay

## 2020-09-01 ENCOUNTER — Emergency Department (HOSPITAL_COMMUNITY): Payer: BC Managed Care – PPO

## 2020-09-01 ENCOUNTER — Encounter (HOSPITAL_COMMUNITY): Payer: Self-pay | Admitting: Emergency Medicine

## 2020-09-01 ENCOUNTER — Inpatient Hospital Stay (HOSPITAL_COMMUNITY)
Admission: EM | Admit: 2020-09-01 | Discharge: 2020-09-02 | DRG: 897 | Disposition: A | Discharge status: Home / Self Care | Payer: BC Managed Care – PPO | Attending: Internal Medicine | Admitting: Internal Medicine

## 2020-09-01 DIAGNOSIS — Y900 Blood alcohol level of less than 20 mg/100 ml: Secondary | ICD-10-CM | POA: Diagnosis present

## 2020-09-01 DIAGNOSIS — F10131 Alcohol abuse with withdrawal delirium: Secondary | ICD-10-CM | POA: Diagnosis not present

## 2020-09-01 DIAGNOSIS — R000 Tachycardia, unspecified: Secondary | ICD-10-CM | POA: Diagnosis present

## 2020-09-01 DIAGNOSIS — N182 Chronic kidney disease, stage 2 (mild): Secondary | ICD-10-CM | POA: Diagnosis present

## 2020-09-01 DIAGNOSIS — I4711 Inappropriate sinus tachycardia, so stated: Secondary | ICD-10-CM | POA: Diagnosis present

## 2020-09-01 DIAGNOSIS — Z8249 Family history of ischemic heart disease and other diseases of the circulatory system: Secondary | ICD-10-CM

## 2020-09-01 DIAGNOSIS — Z833 Family history of diabetes mellitus: Secondary | ICD-10-CM

## 2020-09-01 DIAGNOSIS — F10931 Alcohol use, unspecified with withdrawal delirium: Secondary | ICD-10-CM | POA: Diagnosis present

## 2020-09-01 DIAGNOSIS — Z841 Family history of disorders of kidney and ureter: Secondary | ICD-10-CM

## 2020-09-01 DIAGNOSIS — F10230 Alcohol dependence with withdrawal, uncomplicated: Secondary | ICD-10-CM

## 2020-09-01 DIAGNOSIS — F10930 Alcohol use, unspecified with withdrawal, uncomplicated: Secondary | ICD-10-CM

## 2020-09-01 DIAGNOSIS — Z720 Tobacco use: Secondary | ICD-10-CM | POA: Diagnosis present

## 2020-09-01 DIAGNOSIS — E860 Dehydration: Secondary | ICD-10-CM | POA: Diagnosis present

## 2020-09-01 DIAGNOSIS — Z79899 Other long term (current) drug therapy: Secondary | ICD-10-CM

## 2020-09-01 DIAGNOSIS — I5022 Chronic systolic (congestive) heart failure: Secondary | ICD-10-CM | POA: Diagnosis present

## 2020-09-01 DIAGNOSIS — F10231 Alcohol dependence with withdrawal delirium: Secondary | ICD-10-CM | POA: Diagnosis present

## 2020-09-01 DIAGNOSIS — J45909 Unspecified asthma, uncomplicated: Secondary | ICD-10-CM | POA: Diagnosis present

## 2020-09-01 DIAGNOSIS — N179 Acute kidney failure, unspecified: Secondary | ICD-10-CM | POA: Diagnosis present

## 2020-09-01 DIAGNOSIS — E876 Hypokalemia: Secondary | ICD-10-CM | POA: Diagnosis present

## 2020-09-01 DIAGNOSIS — F17210 Nicotine dependence, cigarettes, uncomplicated: Secondary | ICD-10-CM | POA: Diagnosis present

## 2020-09-01 DIAGNOSIS — Z20822 Contact with and (suspected) exposure to covid-19: Secondary | ICD-10-CM | POA: Diagnosis present

## 2020-09-01 DIAGNOSIS — F10229 Alcohol dependence with intoxication, unspecified: Secondary | ICD-10-CM | POA: Diagnosis present

## 2020-09-01 DIAGNOSIS — F10121 Alcohol abuse with intoxication delirium: Secondary | ICD-10-CM | POA: Diagnosis present

## 2020-09-01 DIAGNOSIS — E1122 Type 2 diabetes mellitus with diabetic chronic kidney disease: Secondary | ICD-10-CM | POA: Diagnosis present

## 2020-09-01 MED ORDER — PANTOPRAZOLE SODIUM 40 MG IV SOLR: 40.0000 mg | Freq: Two times a day (BID) | INTRAVENOUS | Status: DC

## 2020-09-01 MED ORDER — SODIUM CHLORIDE 0.9 % IV SOLN
80.0000 mg | Freq: Once | INTRAVENOUS | Status: AC
  Administered 2020-09-01: 80 mg via INTRAVENOUS
  Filled 2020-09-01: qty 80

## 2020-09-01 MED ORDER — THIAMINE HCL 100 MG/ML IJ SOLN: 100.0000 mg | Freq: Every day | INTRAMUSCULAR | Status: DC

## 2020-09-01 MED ORDER — SODIUM CHLORIDE 0.9 % IV SOLN
8.0000 mg/h | INTRAVENOUS | Status: DC
  Administered 2020-09-01: 8 mg/h via INTRAVENOUS
  Filled 2020-09-01: qty 80

## 2020-09-01 MED ORDER — LORAZEPAM 2 MG/ML IJ SOLN: 0.0000 mg | Freq: Two times a day (BID) | INTRAMUSCULAR | Status: DC

## 2020-09-01 MED ORDER — THIAMINE HCL 100 MG PO TABS
100.0000 mg | ORAL_TABLET | Freq: Every day | ORAL | Status: DC
  Administered 2020-09-02: 100 mg via ORAL
  Filled 2020-09-01: qty 1

## 2020-09-01 MED ORDER — FENTANYL CITRATE (PF) 100 MCG/2ML IJ SOLN
50.0000 ug | Freq: Once | INTRAMUSCULAR | Status: AC
  Administered 2020-09-01: 50 ug via INTRAVENOUS
  Filled 2020-09-01: qty 2

## 2020-09-01 MED ORDER — LORAZEPAM 1 MG PO TABS
0.0000 mg | ORAL_TABLET | Freq: Four times a day (QID) | ORAL | Status: DC
  Administered 2020-09-02: 2 mg via ORAL
  Filled 2020-09-01: qty 2

## 2020-09-01 MED ORDER — ONDANSETRON HCL 4 MG/2ML IJ SOLN
4.0000 mg | Freq: Once | INTRAMUSCULAR | Status: AC
  Administered 2020-09-01: 4 mg via INTRAVENOUS
  Filled 2020-09-01: qty 2

## 2020-09-01 MED ORDER — LORAZEPAM 2 MG/ML IJ SOLN
0.0000 mg | Freq: Four times a day (QID) | INTRAMUSCULAR | Status: DC
  Administered 2020-09-01: 2 mg via INTRAVENOUS
  Administered 2020-09-02: 1 mg via INTRAVENOUS
  Filled 2020-09-01 (×2): qty 1

## 2020-09-01 MED ORDER — LORAZEPAM 1 MG PO TABS: 0.0000 mg | ORAL_TABLET | Freq: Two times a day (BID) | ORAL | Status: DC

## 2020-09-01 NOTE — ED Notes (Signed)
Patient in the restroom at this time.

## 2020-09-01 NOTE — ED Notes (Signed)
Patient actively vomiting. Vomit is blood tinged. Provider contacted

## 2020-09-01 NOTE — ED Triage Notes (Addendum)
Patient comes in complaining of alcohol withdraw and chest pain. Patient states last drink a day and a half ago. Patient noted to be tremorous and tachycardic in triage. Patient states hx of heart failure. Patient seen for same and admitted at the beginning of the month.

## 2020-09-01 NOTE — ED Provider Notes (Addendum)
[facility] DEPT Provider Note   CSN: [id number] Arrival date & time: 09/01/20  2204     History Chief Complaint  Patient presents with  . Chest Pain  . Withdrawal    Robert [name] is a 40 y.o. male.  Patient presents to the emergency department with a chief complaint of chest pain and alcohol withdrawal.  He states that he has not had a drink of alcohol in 2 days.  He states that he has been vomiting.  States the vomiting caused him to have chest pain.  He has history of alcohol withdrawal and has had alcohol withdrawal seizure in the past.  He reports moderate pain.  Denies any treatments prior to arrival.  The history is provided by the patient. No language interpreter was used.       Past Medical History:  Diagnosis Date  . Alcohol use   . Alcohol withdrawal ([place]) 03/06/2020  . Asthma   . CHF (congestive heart failure) [facility])     Patient Active Problem List   Diagnosis Date Noted  . Hypomagnesemia 08/19/2020  . Seizure ([place]) 07/10/2020  . Mood disorder ([place]) 05/16/2020  . Hypotension 03/24/2020  . Polysubstance abuse ([place]) 03/24/2020  . Nausea & vomiting 03/06/2020  . AKI (acute kidney injury) ([place]) 10/30/2019  . Gastritis 10/30/2019  . Elevated INR 10/30/2019  . 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[place])   . Acute heart failure ([place]) 10/05/2019  . Pulmonary embolism ([place]) 10/04/2019  . Acute pulmonary embolism ([place]) 10/04/2019  . Chronic systolic CHF (congestive heart failure) ([place])   . Inappropriate sinus tachycardia   . Chest pain 11/03/2013  . Tobacco abuse 11/03/2013    Past Surgical History:  Procedure Laterality Date  . ESOPHAGOGASTRODUODENOSCOPY (EGD) WITH PROPOFOL N/A 11/01/2019   Procedure: ESOPHAGOGASTRODUODENOSCOPY (EGD) WITH PROPOFOL;  Surgeon: [name], MD;  Location: [facility] ENDOSCOPY;  Service: Endoscopy;  Laterality: N/A;  . RIGHT/LEFT HEART CATH AND CORONARY ANGIOGRAPHY N/A 10/06/2019    Procedure: RIGHT/LEFT HEART CATH AND CORONARY ANGIOGRAPHY;  Surgeon: [name], MD;  Location: [place] CV LAB;  Service: Cardiovascular;  Laterality: N/A;  . WRIST SURGERY         Family History  Problem Relation Age of Onset  . Heart failure Father   . Diabetes Father   . Kidney disease Father   . Heart disease Father 15  . Heart disease Maternal Grandfather 64  . Heart disease Paternal Grandfather 31    Social History   Tobacco Use  . Smoking status: Current Every Day Smoker    Packs/day: 0.25    Years: 24.00    Pack years: 6.00    Types: Cigarettes  . Smokeless tobacco: Never Used  Vaping Use  . Vaping Use: Former  Substance Use Topics  . Alcohol use: Yes    Alcohol/week: 8.0 standard drinks    Types: 8 Shots of liquor per week    Comment: heavy use since age 26  . Drug use: Yes    Frequency: 1.0 times per week    Types: Marijuana    Home Medications Prior to Admission medications   Medication Sig Start Date End Date Taking? Authorizing Provider  carvedilol (COREG) 3.125 MG tablet Take 1 tablet (3.125 mg total) by mouth 2 (two) times daily. 04/11/20 04/11/21  Darrick Grinder D, NP  dapagliflozin propanediol (FARXIGA) 10 MG TABS tablet Take 1 tablet (10 mg total) by mouth daily before breakfast. Patient taking differently: Take  10 mg by mouth daily. 12/09/19   Bensimhon, Shaune Pascal, MD  digoxin (LANOXIN) 0.125 MG tablet Take 1 tablet (0.125 mg total) by mouth daily. 06/14/20   Lyda Jester M, PA-C  folic acid (FOLVITE) 1 MG tablet Take 1 tablet (1 mg total) by mouth daily. 03/16/20   Bensimhon, Shaune Pascal, MD  ivabradine (CORLANOR) 7.5 MG TABS tablet Take 1 tablet (7.5 mg total) by mouth 2 (two) times daily with a meal. 03/27/20   Bensimhon, Shaune Pascal, MD  LORazepam (ATIVAN) 1 MG tablet Take 1 tablet (1 mg total) by mouth every 6 (six) hours as needed for anxiety. 08/20/20   Arrien, Jimmy Picket, MD  ondansetron (ZOFRAN ODT) 4 MG disintegrating tablet Take 1  tablet (4 mg total) by mouth every 8 (eight) hours as needed for nausea or vomiting. 08/20/20   Arrien, Jimmy Picket, MD  pantoprazole (PROTONIX) 40 MG tablet Take 1 tablet (40 mg total) by mouth daily. 08/20/20 09/19/20  Arrien, Jimmy Picket, MD  potassium chloride SA (KLOR-CON) 20 MEQ tablet Take 20 mEq by mouth daily.    [provider]  QUEtiapine (SEROQUEL) 100 MG tablet Take 1.5 tablets (150 mg total) by mouth daily. 06/23/20   Nche, Charlene Brooke, NP  sacubitril-valsartan (ENTRESTO) 97-103 MG Take 1 tablet by mouth 2 (two) times daily. 06/16/20   Lyda Jester M, PA-C  sertraline (ZOLOFT) 25 MG tablet Take 25 mg by mouth daily as needed (anxiety).    [provider]  spironolactone (ALDACTONE) 25 MG tablet Take 1 tablet (25 mg total) by mouth daily. HOLD until 03/26/20 03/24/20   Desiree Hane, MD  sucralfate (CARAFATE) 1 g tablet Take 1 tablet (1 g total) by mouth 4 (four) times daily -  with meals and at bedtime. 07/31/20   Bonnielee Haff, MD  thiamine 100 MG tablet Take 1 tablet (100 mg total) by mouth daily. 07/31/20   Bonnielee Haff, MD    Allergies    Bactrim [sulfamethoxazole-trimethoprim]  Review of Systems   Review of Systems  All other systems reviewed and are negative.   Physical Exam Updated Vital Signs BP (!) 159/96 (BP Location: Right Arm)   Pulse (!) 113   Temp 98.9 F (37.2 C) (Oral)   Resp 20   Ht 5\' 10"  (1.778 m)   Wt 83.9 kg   SpO2 100%   BMI 26.54 kg/m   Physical Exam Vitals and nursing note reviewed.  Constitutional:      Appearance: He is well-developed.  HENT:     Head: Normocephalic and atraumatic.  Eyes:     Conjunctiva/sclera: Conjunctivae normal.  Cardiovascular:     Rate and Rhythm: Regular rhythm. Tachycardia present.     Heart sounds: No murmur heard.   Pulmonary:     Effort: Pulmonary effort is normal. No respiratory distress.     Breath sounds: Normal breath sounds.  Abdominal:     Palpations: Abdomen is  soft.     Tenderness: There is no abdominal tenderness.  Musculoskeletal:     Cervical back: Neck supple.  Skin:    General: Skin is warm and dry.  Neurological:     Mental Status: He is alert and oriented to person, place, and time.     Comments: Tremulous  Psychiatric:        Mood and Affect: Mood normal.        Behavior: Behavior normal.     ED Results / Procedures / Treatments   Labs (all labs ordered  are listed, but only abnormal results are displayed) Labs Reviewed  RESP PANEL BY RT-PCR (FLU A&B, COVID) [id number]  CBC  COMPREHENSIVE METABOLIC PANEL  SALICYLATE LEVEL  ACETAMINOPHEN LEVEL  ETHANOL  RAPID URINE DRUG SCREEN, HOSP PERFORMED  BRAIN NATRIURETIC PEPTIDE  CBG MONITORING, ED  TROPONIN I (HIGH SENSITIVITY)    EKG EKG Interpretation  Date/Time:  Friday September 01 2020 22:16:26 EDT Ventricular Rate:  127 PR Interval:  198 QRS Duration: 100 QT Interval:  333 QTC Calculation: 484 R Axis:   72 Text Interpretation: Sinus tachycardia Borderline prolonged PR interval LAE, consider biatrial enlargement Abnormal T, consider ischemia, diffuse leads Baseline wander in lead(s) V3 V4 V5 V6 12 Lead; [place] Confirmed by Madalyn Rob [id number]) on 09/01/2020 10:19:36 PM   Radiology No results found.  Procedures Procedures   Medications Ordered in ED Medications  LORazepam (ATIVAN) injection 0-4 mg (has no administration in time range)    Or  LORazepam (ATIVAN) tablet 0-4 mg (has no administration in time range)  LORazepam (ATIVAN) injection 0-4 mg (has no administration in time range)    Or  LORazepam (ATIVAN) tablet 0-4 mg (has no administration in time range)  thiamine tablet 100 mg (has no administration in time range)    Or  thiamine (B-1) injection 100 mg (has no administration in time range)  ondansetron (ZOFRAN) injection 4 mg (has no administration in time range)  fentaNYL (SUBLIMAZE) injection 50 mcg (has no administration in time range)    ED  Course  I have reviewed the triage vital signs and the nursing notes.  Pertinent labs & imaging results that were available during my care of the patient were reviewed by me and considered in my medical decision making (see chart for details).    MDM Rules/Calculators/A&P                          This patient complains of nausea, vomiting, chest pain, this involves an extensive number of treatment options, and is a complaint that carries with it a high risk of complications and morbidity.    Patient is very tremulous.  He has not had any alcohol in the past day and a half.  I am suspicious for acute alcohol withdrawal.  I think this is likely causing his vomiting.  10:41 PM Notified that patient had small amount of blood in his vomit.  Will give Protonix in addition to Ativan and CIWA protocol.  Suspect alcohol withdrawal, believe ACS, gastroenteritis, PE to be less likely at this point.  Pertinent Labs I ordered, reviewed, and interpreted labs, which included CBG 195, hemoglobin 12.6, troponin 9, COVID-negative, potassium slightly low at 3.3, creatinine 1.53, this is about baseline for patient.  Anion gap is 23, consider alcoholic ketoacidosis.  Coingestants, salicylate and acetaminophen levels are negative, alcohol less than 10 (last drink 1.5 to 2 days ago).  Imaging Interpretation I ordered imaging studies which included chest x-ray.  I independently visualized and interpreted the chest x-ray, which showed no obvious abnormality.   Medications I ordered medication Ativan and Zofran for nausea, vomiting, and alcohol withdrawal.  Sources Previous records obtained and reviewed recent admission for AKI.  History of CHF, EF is 30%.  Reassessments After the interventions stated above, I reevaluated the patient and found less tremulous, appears improved from initial exam.  Consultants Hospitalist, Dr. Jonelle Sidle, who is appreciated for admitting.  Plan Admit    Final Clinical  Impression(s) / ED Diagnoses Final diagnoses:  Alcohol withdrawal syndrome without complication [facility])    Rx / DC Orders ED Discharge Orders    None       Chase, Arnall, PA-C 09/02/20 0045    Montine Circle, PA-C 09/02/20 4709    Lucrezia Starch, MD 09/04/20 [phone]

## 2020-09-02 DIAGNOSIS — E876 Hypokalemia: Secondary | ICD-10-CM | POA: Diagnosis present

## 2020-09-02 DIAGNOSIS — F17210 Nicotine dependence, cigarettes, uncomplicated: Secondary | ICD-10-CM | POA: Diagnosis present

## 2020-09-02 DIAGNOSIS — E860 Dehydration: Secondary | ICD-10-CM | POA: Diagnosis present

## 2020-09-02 DIAGNOSIS — I5022 Chronic systolic (congestive) heart failure: Secondary | ICD-10-CM

## 2020-09-02 DIAGNOSIS — N179 Acute kidney failure, unspecified: Secondary | ICD-10-CM | POA: Diagnosis present

## 2020-09-02 DIAGNOSIS — J45909 Unspecified asthma, uncomplicated: Secondary | ICD-10-CM | POA: Diagnosis present

## 2020-09-02 DIAGNOSIS — Y900 Blood alcohol level of less than 20 mg/100 ml: Secondary | ICD-10-CM | POA: Diagnosis present

## 2020-09-02 DIAGNOSIS — F10131 Alcohol abuse with withdrawal delirium: Secondary | ICD-10-CM | POA: Diagnosis present

## 2020-09-02 DIAGNOSIS — F10231 Alcohol dependence with withdrawal delirium: Secondary | ICD-10-CM | POA: Diagnosis present

## 2020-09-02 DIAGNOSIS — N182 Chronic kidney disease, stage 2 (mild): Secondary | ICD-10-CM | POA: Diagnosis present

## 2020-09-02 DIAGNOSIS — Z79899 Other long term (current) drug therapy: Secondary | ICD-10-CM | POA: Diagnosis not present

## 2020-09-02 DIAGNOSIS — Z20822 Contact with and (suspected) exposure to covid-19: Secondary | ICD-10-CM | POA: Diagnosis present

## 2020-09-02 DIAGNOSIS — Z841 Family history of disorders of kidney and ureter: Secondary | ICD-10-CM | POA: Diagnosis not present

## 2020-09-02 DIAGNOSIS — F10230 Alcohol dependence with withdrawal, uncomplicated: Secondary | ICD-10-CM

## 2020-09-02 DIAGNOSIS — E1122 Type 2 diabetes mellitus with diabetic chronic kidney disease: Secondary | ICD-10-CM | POA: Diagnosis present

## 2020-09-02 DIAGNOSIS — F10931 Alcohol use, unspecified with withdrawal delirium: Secondary | ICD-10-CM | POA: Diagnosis present

## 2020-09-02 DIAGNOSIS — Z833 Family history of diabetes mellitus: Secondary | ICD-10-CM | POA: Diagnosis not present

## 2020-09-02 DIAGNOSIS — Z8249 Family history of ischemic heart disease and other diseases of the circulatory system: Secondary | ICD-10-CM | POA: Diagnosis not present

## 2020-09-02 DIAGNOSIS — F10121 Alcohol abuse with intoxication delirium: Secondary | ICD-10-CM | POA: Diagnosis present

## 2020-09-02 LAB — CBC
HCT: 37.3 % — ABNORMAL LOW (ref 39.0–52.0)
HCT: 35.3 % — ABNORMAL LOW (ref 39.0–52.0)
Hemoglobin: 12.6 g/dL — ABNORMAL LOW (ref 13.0–17.0)
Hemoglobin: 11.6 g/dL — ABNORMAL LOW (ref 13.0–17.0)
MCH: 30.7 pg (ref 26.0–34.0)
MCH: 30.1 pg (ref 26.0–34.0)
MCHC: 33.8 g/dL (ref 30.0–36.0)
MCHC: 32.9 g/dL (ref 30.0–36.0)
MCV: 90.8 fL (ref 80.0–100.0)
MCV: 91.5 fL (ref 80.0–100.0)
Platelets: 277 10*3/uL (ref 150–400)
Platelets: 218 10*3/uL (ref 150–400)
RBC: 4.11 MIL/uL — ABNORMAL LOW (ref 4.22–5.81)
RBC: 3.86 MIL/uL — ABNORMAL LOW (ref 4.22–5.81)
RDW: 14 % (ref 11.5–15.5)
RDW: 14.4 % (ref 11.5–15.5)
WBC: 7.4 10*3/uL (ref 4.0–10.5)
WBC: 11.9 10*3/uL — ABNORMAL HIGH (ref 4.0–10.5)
nRBC: 0 % (ref 0.0–0.2)
nRBC: 0 % (ref 0.0–0.2)

## 2020-09-02 LAB — COMPREHENSIVE METABOLIC PANEL
ALT: 72 U/L — ABNORMAL HIGH (ref 0–44)
ALT: 63 U/L — ABNORMAL HIGH (ref 0–44)
AST: 68 U/L — ABNORMAL HIGH (ref 15–41)
AST: 50 U/L — ABNORMAL HIGH (ref 15–41)
Albumin: 4.8 g/dL (ref 3.5–5.0)
Albumin: 4.4 g/dL (ref 3.5–5.0)
Alkaline Phosphatase: 59 U/L (ref 38–126)
Alkaline Phosphatase: 53 U/L (ref 38–126)
Anion gap: 23 — ABNORMAL HIGH (ref 5–15)
Anion gap: 16 — ABNORMAL HIGH (ref 5–15)
BUN: 16 mg/dL (ref 6–20)
BUN: 16 mg/dL (ref 6–20)
CO2: 23 mmol/L (ref 22–32)
CO2: 29 mmol/L (ref 22–32)
Calcium: 9.6 mg/dL (ref 8.9–10.3)
Calcium: 9.3 mg/dL (ref 8.9–10.3)
Chloride: 94 mmol/L — ABNORMAL LOW (ref 98–111)
Chloride: 92 mmol/L — ABNORMAL LOW (ref 98–111)
Creatinine, Ser: 1.53 mg/dL — ABNORMAL HIGH (ref 0.61–1.24)
Creatinine, Ser: 1.6 mg/dL — ABNORMAL HIGH (ref 0.61–1.24)
GFR, Estimated: 59 mL/min — ABNORMAL LOW (ref >60)
GFR, Estimated: 56 mL/min — ABNORMAL LOW (ref >60)
Glucose, Bld: 173 mg/dL — ABNORMAL HIGH (ref 70–99)
Glucose, Bld: 92 mg/dL (ref 70–99)
Potassium: 3.3 mmol/L — ABNORMAL LOW (ref 3.5–5.1)
Potassium: 3.4 mmol/L — ABNORMAL LOW (ref 3.5–5.1)
Sodium: 140 mmol/L (ref 135–145)
Sodium: 137 mmol/L (ref 135–145)
Total Bilirubin: 1.1 mg/dL (ref 0.3–1.2)
Total Bilirubin: 1.4 mg/dL — ABNORMAL HIGH (ref 0.3–1.2)
Total Protein: 8.5 g/dL — ABNORMAL HIGH (ref 6.5–8.1)
Total Protein: 7.9 g/dL (ref 6.5–8.1)

## 2020-09-02 LAB — RAPID URINE DRUG SCREEN, HOSP PERFORMED
Amphetamines: NOT DETECTED
Barbiturates: NOT DETECTED
Benzodiazepines: NOT DETECTED
Cocaine: NOT DETECTED
Opiates: NOT DETECTED
Tetrahydrocannabinol: POSITIVE — AB

## 2020-09-02 LAB — RESP PANEL BY RT-PCR (FLU A&B, COVID) ARPGX2
Influenza A by PCR: NEGATIVE
Influenza B by PCR: NEGATIVE
SARS Coronavirus 2 by RT PCR: NEGATIVE

## 2020-09-02 LAB — BRAIN NATRIURETIC PEPTIDE: B Natriuretic Peptide: 141.3 pg/mL — ABNORMAL HIGH (ref 0.0–100.0)

## 2020-09-02 LAB — TROPONIN I (HIGH SENSITIVITY)
Troponin I (High Sensitivity): 7 ng/L (ref <18)
Troponin I (High Sensitivity): 9 ng/L (ref <18)

## 2020-09-02 LAB — CBG MONITORING, ED: Glucose-Capillary: 195 mg/dL — ABNORMAL HIGH (ref 70–99)

## 2020-09-02 LAB — ACETAMINOPHEN LEVEL: Acetaminophen (Tylenol), Serum: <10 ug/mL — ABNORMAL LOW (ref 10–30)

## 2020-09-02 LAB — DIGOXIN LEVEL: Digoxin Level: <0.2 ng/mL — ABNORMAL LOW (ref 0.8–2.0)

## 2020-09-02 LAB — SALICYLATE LEVEL: Salicylate Lvl: <7 mg/dL — ABNORMAL LOW (ref 7.0–30.0)

## 2020-09-02 LAB — ETHANOL: Alcohol, Ethyl (B): <10 mg/dL (ref <10)

## 2020-09-02 LAB — MAGNESIUM: Magnesium: 1.3 mg/dL — ABNORMAL LOW (ref 1.7–2.4)

## 2020-09-02 MED ORDER — ONDANSETRON HCL 4 MG/2ML IJ SOLN: 4.0000 mg | Freq: Four times a day (QID) | INTRAMUSCULAR | Status: DC | PRN

## 2020-09-02 MED ORDER — POTASSIUM CHLORIDE CRYS ER 20 MEQ PO TBCR
60.0000 meq | EXTENDED_RELEASE_TABLET | Freq: Once | ORAL | Status: AC
  Administered 2020-09-02: 60 meq via ORAL
  Filled 2020-09-02: qty 3

## 2020-09-02 MED ORDER — THIAMINE HCL 100 MG PO TABS: 100.0000 mg | ORAL_TABLET | Freq: Every day | ORAL | Status: DC

## 2020-09-02 MED ORDER — FOLIC ACID 1 MG PO TABS
1.0000 mg | ORAL_TABLET | Freq: Every day | ORAL | Status: DC
  Administered 2020-09-02: 1 mg via ORAL
  Filled 2020-09-02: qty 1

## 2020-09-02 MED ORDER — DIGOXIN 125 MCG PO TABS
0.1250 mg | ORAL_TABLET | Freq: Every day | ORAL | Status: DC
  Administered 2020-09-02: 0.125 mg via ORAL
  Filled 2020-09-02: qty 1

## 2020-09-02 MED ORDER — HEPARIN SODIUM (PORCINE) 5000 UNIT/ML IJ SOLN
5000.0000 [IU] | Freq: Three times a day (TID) | INTRAMUSCULAR | Status: DC
  Administered 2020-09-02: 5000 [IU] via SUBCUTANEOUS
  Filled 2020-09-02: qty 1

## 2020-09-02 MED ORDER — PANTOPRAZOLE SODIUM 40 MG PO TBEC: 40.0000 mg | DELAYED_RELEASE_TABLET | Freq: Every day | ORAL | Status: DC

## 2020-09-02 MED ORDER — ACETAMINOPHEN 325 MG PO TABS
650.0000 mg | ORAL_TABLET | Freq: Once | ORAL | Status: AC
  Administered 2020-09-02: 650 mg via ORAL
  Filled 2020-09-02: qty 2

## 2020-09-02 MED ORDER — ONDANSETRON HCL 4 MG PO TABS: 4.0000 mg | ORAL_TABLET | Freq: Four times a day (QID) | ORAL | Status: DC | PRN

## 2020-09-02 MED ORDER — SPIRONOLACTONE 25 MG PO TABS
25.0000 mg | ORAL_TABLET | Freq: Every day | ORAL | Status: DC
  Administered 2020-09-02: 25 mg via ORAL
  Filled 2020-09-02: qty 1

## 2020-09-02 MED ORDER — POTASSIUM CHLORIDE CRYS ER 20 MEQ PO TBCR: 20.0000 meq | EXTENDED_RELEASE_TABLET | Freq: Every day | ORAL | Status: DC

## 2020-09-02 MED ORDER — MAGNESIUM SULFATE 4 GM/100ML IV SOLN
4.0000 g | Freq: Once | INTRAVENOUS | Status: AC
  Administered 2020-09-02: 4 g via INTRAVENOUS
  Filled 2020-09-02: qty 100

## 2020-09-02 MED ORDER — CARVEDILOL 3.125 MG PO TABS
3.1250 mg | ORAL_TABLET | Freq: Two times a day (BID) | ORAL | Status: DC
  Administered 2020-09-02: 3.125 mg via ORAL
  Filled 2020-09-02: qty 1

## 2020-09-02 MED ORDER — SACUBITRIL-VALSARTAN 97-103 MG PO TABS
1.0000 | ORAL_TABLET | Freq: Two times a day (BID) | ORAL | Status: DC
  Administered 2020-09-02: 1 via ORAL
  Filled 2020-09-02 (×2): qty 1

## 2020-09-02 MED ORDER — SUCRALFATE 1 G PO TABS
1.0000 g | ORAL_TABLET | Freq: Three times a day (TID) | ORAL | Status: DC
  Administered 2020-09-02 (×2): 1 g via ORAL
  Filled 2020-09-02 (×2): qty 1

## 2020-09-02 MED ORDER — NICOTINE 21 MG/24HR TD PT24: 21.0000 mg | MEDICATED_PATCH | Freq: Every day | TRANSDERMAL | Status: DC

## 2020-09-02 NOTE — Discharge Summary (Signed)
Physician Discharge Summary  Robert [name] HUD:[id number] DOB: 1980/08/24 DOA: 09/01/2020  PCP: Flossie Buffy, NP  Admit date: 09/01/2020 Discharge date: 09/02/2020  Admitted From: Home Disposition:  Home  Recommendations for Outpatient Follow-up:  1. Follow up with PCP in 1-2 weeks 2. Patient instructed not to drive for 6 months following recent seizure activity  Discharge Condition:Stable, improved CODE STATUS:Full Diet recommendation: Diabetic, heart healthy   Brief/Interim Summary: 40 y.o. male with medical history significant of alcohol abuse, systolic dysfunction CHF, tobacco abuse, asthma, GERD who presented to the ER complaining of alcohol withdrawal and chest pain symptoms.  Patient's last drink was a day and a half ago.  He started getting tachycardic and tremulous.  He was worried and having some chest discomfort. Pt was found to be in ETOH withdrawals  Discharge Diagnoses:  Principal Problem:   Delirium tremens ([place]) Active Problems:   Tobacco abuse   Chronic systolic CHF (congestive heart failure) (H[facility])   Inappropriate sinus tachycardia   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[place])   #1 delirium tremens:  -Presented in acute ETOH withdrawals -Patient was continued on CIWA protocol.   -While under CIWA protocol, withdrawals subsided and patient remained medically stable -Patient's mother at bedside reported that patient appeared "100% better" on the day of d/c  #2 chronic systolic dysfunction: Last EF of 30% from February of this year. Pt was continued on his cardiac medication including Entresto, Coreg as well as digoxin.   #3 tobacco abuse: Nicotine patch given while in hospital  #4 alcohol abuse: Cessation done at bedside. Had consulted TOC to provide resources for patient  #5 hypokalemia and hypomagnesemia: Potassium and Magnesium replaced  #6 CKD stage 2: labs reviewed. Current Cr is very similar to previous Cr  measurements. Pt reports voiding well and is tolerating PO without difficulty   Discharge Instructions   Allergies as of 09/02/2020      Reactions   Bactrim [sulfamethoxazole-trimethoprim] Rash      Medication List    TAKE these medications   carvedilol 3.125 MG tablet Commonly known as: Coreg Take 1 tablet (3.125 mg total) by mouth 2 (two) times daily.   dapagliflozin propanediol 10 MG Tabs tablet Commonly known as: Farxiga Take 1 tablet (10 mg total) by mouth daily before breakfast. What changed: when to take this   digoxin 0.125 MG tablet Commonly known as: LANOXIN Take 1 tablet (0.125 mg total) by mouth daily.   Entresto 97-103 MG Generic drug: sacubitril-valsartan Take 1 tablet by mouth 2 (two) times daily.   folic acid 1 MG tablet Commonly known as: FOLVITE Take 1 tablet (1 mg total) by mouth daily.   ivabradine 7.5 MG Tabs tablet Commonly known as: CORLANOR Take 1 tablet (7.5 mg total) by mouth 2 (two) times daily with a meal.   LORazepam 1 MG tablet Commonly known as: ATIVAN Take 1 tablet (1 mg total) by mouth every 6 (six) hours as needed for anxiety.   ondansetron 4 MG disintegrating tablet Commonly known as: Zofran ODT Take 1 tablet (4 mg total) by mouth every 8 (eight) hours as needed for nausea or vomiting.   pantoprazole 40 MG tablet Commonly known as: PROTONIX Take 1 tablet (40 mg total) by mouth daily.   potassium chloride SA 20 MEQ tablet Commonly known as: KLOR-CON Take 20 mEq by mouth daily.   QUEtiapine 100 MG tablet Commonly known as: SEROQUEL Take 1.5 tablets (150 mg total) by mouth daily.   sertraline 25  MG tablet Commonly known as: ZOLOFT Take 25 mg by mouth daily as needed (anxiety).   spironolactone 25 MG tablet Commonly known as: ALDACTONE Take 1 tablet (25 mg total) by mouth daily. HOLD until 03/26/20   sucralfate 1 g tablet Commonly known as: Carafate Take 1 tablet (1 g total) by mouth 4 (four) times daily -  with meals  and at bedtime.   thiamine 100 MG tablet Take 1 tablet (100 mg total) by mouth daily.       Follow-up Information    Nche, Charlene Brooke, NP. Schedule an appointment as soon as possible for a visit in 2 week(s).   Specialty: Internal Medicine Contact information: [place] [address] [phone]        Skeet Latch, MD .   Specialty: Cardiology Contact information: [address] [place] [address] [phone]              Allergies  Allergen Reactions  . Bactrim [Sulfamethoxazole-Trimethoprim] Rash    Procedures/Studies: DG Chest 2 View  Result Date: 09/01/2020 CLINICAL DATA:  Chest pain EXAM: CHEST - 2 VIEW COMPARISON:  None. FINDINGS: The heart size and mediastinal contours are within normal limits. Both lungs are clear. The visualized skeletal structures are unremarkable. IMPRESSION: No active cardiopulmonary disease. Electronically Signed   By: Ulyses Jarred M.D.   On: 09/01/2020 22:38     Subjective: Reports feeling better  Discharge Exam: Vitals:   09/02/20 1230 09/02/20 1430  BP: (!) 135/101 (!) 135/101  Pulse: 74 74  Resp: 12 16  Temp:    SpO2: 100% 100%   Vitals:   09/02/20 1200 09/02/20 1215 09/02/20 1230 09/02/20 1430  BP: (!) 147/115 (!) 137/103 (!) 135/101 (!) 135/101  Pulse: 80 68 74 74  Resp: 12 12 12 16   Temp:      TempSrc:      SpO2: 100% 98% 100% 100%  Weight:      Height:        General: Pt is alert, awake, not in acute distress Cardiovascular: RRR, S1/S2 +, no rubs, no gallops Respiratory: CTA bilaterally, no wheezing, no rhonchi Abdominal: Soft, NT, ND, bowel sounds + Extremities: no edema, no cyanosis   The results of significant diagnostics from this hospitalization (including imaging, microbiology, ancillary and laboratory) are listed below for reference.     Microbiology: Recent Results (from the past 240 hour(s))  Resp Panel by RT-PCR (Flu A&B, Covid) Nasopharyngeal  Swab     Status: None   Collection Time: 09/01/20 10:35 PM   Specimen: Nasopharyngeal Swab; Nasopharyngeal(NP) swabs in vial transport medium  Result Value Ref Range Status   SARS Coronavirus 2 by RT PCR NEGATIVE NEGATIVE Final    Comment: (NOTE) SARS-CoV-2 target nucleic acids are NOT DETECTED.  The SARS-CoV-2 RNA is generally detectable in upper respiratory specimens during the acute phase of infection. The lowest concentration of SARS-CoV-2 viral copies this assay can detect is 138 copies/mL. A negative result does not preclude SARS-Cov-2 infection and should not be used as the sole basis for treatment or other patient management decisions. A negative result may occur with  improper specimen collection/handling, submission of specimen other than nasopharyngeal swab, presence of viral mutation(s) within the areas targeted by this assay, and inadequate number of viral copies(<138 copies/mL). A negative result must be combined with clinical observations, patient history, and epidemiological information. The expected result is Negative.  Fact Sheet for Patients:  EntrepreneurPulse.com.au  Fact Sheet for Healthcare  Providers:  IncredibleEmployment.be  This test is no t yet approved or cleared by the [place] and  has been authorized for detection and/or diagnosis of SARS-CoV-2 by FDA under an Emergency Use Authorization (EUA). This EUA will remain  in effect (meaning this test can be used) for the duration of the COVID-19 declaration under Section 564(b)(1) of the Act, 21 U.S.C.section 360bbb-3(b)(1), unless the authorization is terminated  or revoked sooner.       Influenza A by PCR NEGATIVE NEGATIVE Final   Influenza B by PCR NEGATIVE NEGATIVE Final    Comment: (NOTE) The Xpert Xpress SARS-CoV-2/FLU/RSV plus assay is intended as an aid in the diagnosis of influenza from Nasopharyngeal swab specimens and should not be used as a sole  basis for treatment. Nasal washings and aspirates are unacceptable for Xpert Xpress SARS-CoV-2/FLU/RSV testing.  Fact Sheet for Patients: EntrepreneurPulse.com.au  Fact Sheet for Healthcare Providers: IncredibleEmployment.be  This test is not yet approved or cleared by the [place] FDA and has been authorized for detection and/or diagnosis of SARS-CoV-2 by FDA under an Emergency Use Authorization (EUA). This EUA will remain in effect (meaning this test can be used) for the duration of the COVID-19 declaration under Section 564(b)(1) of the Act, 21 U.S.C. section 360bbb-3(b)(1), unless the authorization is terminated or revoked.  Performed at [facility], [place] [address], [place], [place] [address]      Labs: BNP (last 3 results) Recent Labs    03/23/20 0513 07/28/20 2141 09/01/20 2236  BNP 87.4 72.2 614.4*   Basic Metabolic Panel: Recent Labs  Lab 09/01/20 2234 09/02/20 0056 09/02/20 0439  NA 140  --  137  K 3.3*  --  3.4*  CL 94*  --  92*  CO2 23  --  29  GLUCOSE 173*  --  92  BUN 16  --  16  CREATININE 1.53*  --  1.60*  CALCIUM 9.6  --  9.3  MG  --  1.3*  --    Liver Function Tests: Recent Labs  Lab 09/01/20 2234 09/02/20 0439  AST 68* 50*  ALT 72* 63*  ALKPHOS 59 53  BILITOT 1.1 1.4*  PROT 8.5* 7.9  ALBUMIN 4.8 4.4   No results for input(s): LIPASE, AMYLASE in the last 168 hours. No results for input(s): AMMONIA in the last 168 hours. CBC: Recent Labs  Lab 09/01/20 2243 09/02/20 0439  WBC 7.4 11.9*  HGB 12.6* 11.6*  HCT 37.3* 35.3*  MCV 90.8 91.5  PLT 277 218   Cardiac Enzymes: No results for input(s): CKTOTAL, CKMB, CKMBINDEX, TROPONINI in the last 168 hours. BNP: Invalid input(s): POCBNP CBG: Recent Labs  Lab 09/02/20 0038  GLUCAP 195*   D-Dimer No results for input(s): DDIMER in the last 72 hours. Hgb A1c No results for input(s): HGBA1C in the last 72 hours. Lipid  Profile No results for input(s): CHOL, HDL, LDLCALC, TRIG, CHOLHDL, LDLDIRECT in the last 72 hours. Thyroid function studies No results for input(s): TSH, T4TOTAL, T3FREE, THYROIDAB in the last 72 hours.  Invalid input(s): FREET3 Anemia work up No results for input(s): VITAMINB12, FOLATE, FERRITIN, TIBC, IRON, RETICCTPCT in the last 72 hours. Urinalysis    Component Value Date/Time   COLORURINE YELLOW 07/28/2020 0005   APPEARANCEUR CLEAR 07/28/2020 0005   LABSPEC 1.023 07/28/2020 0005   PHURINE 5.0 07/28/2020 0005   GLUCOSEU >=500 (A) 07/28/2020 0005   HGBUR SMALL (A) 07/28/2020 0005   BILIRUBINUR NEGATIVE 07/28/2020 0005  KETONESUR 20 (A) 07/28/2020 0005   PROTEINUR 100 (A) 07/28/2020 0005   NITRITE NEGATIVE 07/28/2020 0005   LEUKOCYTESUR NEGATIVE 07/28/2020 0005   Sepsis Labs Invalid input(s): PROCALCITONIN,  WBC,  LACTICIDVEN Microbiology Recent Results (from the past 240 hour(s))  Resp Panel by RT-PCR (Flu A&B, Covid) Nasopharyngeal Swab     Status: None   Collection Time: 09/01/20 10:35 PM   Specimen: Nasopharyngeal Swab; Nasopharyngeal(NP) swabs in vial transport medium  Result Value Ref Range Status   SARS Coronavirus 2 by RT PCR NEGATIVE NEGATIVE Final    Comment: (NOTE) SARS-CoV-2 target nucleic acids are NOT DETECTED.  The SARS-CoV-2 RNA is generally detectable in upper respiratory specimens during the acute phase of infection. The lowest concentration of SARS-CoV-2 viral copies this assay can detect is 138 copies/mL. A negative result does not preclude SARS-Cov-2 infection and should not be used as the sole basis for treatment or other patient management decisions. A negative result may occur with  improper specimen collection/handling, submission of specimen other than nasopharyngeal swab, presence of viral mutation(s) within the areas targeted by this assay, and inadequate number of viral copies(<138 copies/mL). A negative result must be combined  with clinical observations, patient history, and epidemiological information. The expected result is Negative.  Fact Sheet for Patients:  EntrepreneurPulse.com.au  Fact Sheet for Healthcare Providers:  IncredibleEmployment.be  This test is no t yet approved or cleared by the [place] FDA and  has been authorized for detection and/or diagnosis of SARS-CoV-2 by FDA under an Emergency Use Authorization (EUA). This EUA will remain  in effect (meaning this test can be used) for the duration of the COVID-19 declaration under Section 564(b)(1) of the Act, 21 U.S.C.section 360bbb-3(b)(1), unless the authorization is terminated  or revoked sooner.       Influenza A by PCR NEGATIVE NEGATIVE Final   Influenza B by PCR NEGATIVE NEGATIVE Final    Comment: (NOTE) The Xpert Xpress SARS-CoV-2/FLU/RSV plus assay is intended as an aid in the diagnosis of influenza from Nasopharyngeal swab specimens and should not be used as a sole basis for treatment. Nasal washings and aspirates are unacceptable for Xpert Xpress SARS-CoV-2/FLU/RSV testing.  Fact Sheet for Patients: EntrepreneurPulse.com.au  Fact Sheet for Healthcare Providers: IncredibleEmployment.be  This test is not yet approved or cleared by the [place] FDA and has been authorized for detection and/or diagnosis of SARS-CoV-2 by FDA under an Emergency Use Authorization (EUA). This EUA will remain in effect (meaning this test can be used) for the duration of the COVID-19 declaration under Section 564(b)(1) of the Act, 21 U.S.C. section 360bbb-3(b)(1), unless the authorization is terminated or revoked.  Performed at [facility], [place] [address], [place], [place] [address]    Time spent: 30 min  SIGNED:   Marylu Lund, MD  [facility] 09/02/2020, 2:42 PM  If 7PM-7AM, please contact night-coverage

## 2020-09-02 NOTE — ED Notes (Signed)
Lunch tray delivered.

## 2020-09-02 NOTE — H&P (Signed)
History and Physical   Robert [name] GDJ:[id number] DOB: Sep 02, 1980 DOA: 09/01/2020  Referring MD/NP/PA: Dr. Roslynn Amble  PCP: Flossie Buffy, NP   Outpatient Specialists: None  Patient coming from: Home  Chief Complaint: Tachycardia and chest pain  HPI: Robert [name] is a 40 y.o. male with medical history significant of alcohol abuse, systolic dysfunction CHF, tobacco abuse, asthma, GERD who presented to the ER complaining of alcohol withdrawal and chest pain symptoms.  Patient's last drink was a day and a half ago.  He started getting tachycardic and tremulous.  He was worried and having some chest discomfort.  He came to the ER.  Patient is seen here his alcohol level is less than 10.  He is tremulous with evidence of alcohol withdrawals.  Also tachycardic. Patient has had seizures in the past but not today.  Patient is being admitted for symptomatic management of delirium tremens..  ED Course: Temperature 98.9 blood pressure 115/96, pulse 135, respiratory of 20 oxygen sat 81% on room air.  White count 7.4 hemoglobin 12.6 and platelet 277.  Chemistry showed potassium 3.3 chloride 94 with creatinine 1.53.  Tylenol level less than 10 salicylate less than 7 alcohol level less than 10 respiratory panel negative.  Chest x-ray showed no acute findings.  Patient admitted for alcohol withdrawal symptoms  Review of Systems: As per HPI otherwise 10 point review of systems negative.    Past Medical History:  Diagnosis Date  . Alcohol use   . Alcohol withdrawal ([place]) 03/06/2020  . Asthma   . CHF (congestive heart failure) ([place])     Past Surgical History:  Procedure Laterality Date  . ESOPHAGOGASTRODUODENOSCOPY (EGD) WITH PROPOFOL N/A 11/01/2019   Procedure: ESOPHAGOGASTRODUODENOSCOPY (EGD) WITH PROPOFOL;  Surgeon: [name], MD;  Location: [facility] ENDOSCOPY;  Service: Endoscopy;  Laterality: N/A;  . RIGHT/LEFT HEART CATH AND CORONARY ANGIOGRAPHY N/A 10/06/2019   Procedure: RIGHT/LEFT  HEART CATH AND CORONARY ANGIOGRAPHY;  Surgeon: [name], MD;  Location: [facility];  Service: Cardiovascular;  Laterality: N/A;  . WRIST SURGERY       reports that he has been smoking cigarettes. He has a 6.00 pack-year smoking history. He has never used smokeless tobacco. He reports current alcohol use of about 8.0 standard drinks of alcohol per week. He reports current drug use. Frequency: 1.00 time per week. Drug: Marijuana.  Allergies  Allergen Reactions  . Bactrim [Sulfamethoxazole-Trimethoprim] Rash    Family History  Problem Relation Age of Onset  . Heart failure Father   . Diabetes Father   . Kidney disease Father   . Heart disease Father 32  . Heart disease Maternal Grandfather 43  . Heart disease Paternal Grandfather 32     Prior to Admission medications   Medication Sig Start Date End Date Taking? Authorizing Provider  carvedilol (COREG) 3.125 MG tablet Take 1 tablet (3.125 mg total) by mouth 2 (two) times daily. 04/11/20 04/11/21  Darrick Grinder D, NP  dapagliflozin propanediol (FARXIGA) 10 MG TABS tablet Take 1 tablet (10 mg total) by mouth daily before breakfast. Patient taking differently: Take 10 mg by mouth daily. 12/09/19   Bensimhon, Shaune Pascal, MD  digoxin (LANOXIN) 0.125 MG tablet Take 1 tablet (0.125 mg total) by mouth daily. 06/14/20   Lyda Jester M, PA-C  folic acid (FOLVITE) 1 MG tablet Take 1 tablet (1 mg total) by mouth daily. 03/16/20   Bensimhon, Shaune Pascal, MD  ivabradine (CORLANOR) 7.5 MG TABS tablet Take 1 tablet (7.5  mg total) by mouth 2 (two) times daily with a meal. 03/27/20   Bensimhon, Shaune Pascal, MD  LORazepam (ATIVAN) 1 MG tablet Take 1 tablet (1 mg total) by mouth every 6 (six) hours as needed for anxiety. 08/20/20   Arrien, Jimmy Picket, MD  ondansetron (ZOFRAN ODT) 4 MG disintegrating tablet Take 1 tablet (4 mg total) by mouth every 8 (eight) hours as needed for nausea or vomiting. 08/20/20   Arrien, Jimmy Picket, MD  pantoprazole  (PROTONIX) 40 MG tablet Take 1 tablet (40 mg total) by mouth daily. 08/20/20 09/19/20  Arrien, Jimmy Picket, MD  potassium chloride SA (KLOR-CON) 20 MEQ tablet Take 20 mEq by mouth daily.    [provider]  QUEtiapine (SEROQUEL) 100 MG tablet Take 1.5 tablets (150 mg total) by mouth daily. 06/23/20   Nche, Charlene Brooke, NP  sacubitril-valsartan (ENTRESTO) 97-103 MG Take 1 tablet by mouth 2 (two) times daily. 06/16/20   Lyda Jester M, PA-C  sertraline (ZOLOFT) 25 MG tablet Take 25 mg by mouth daily as needed (anxiety).    [provider]  spironolactone (ALDACTONE) 25 MG tablet Take 1 tablet (25 mg total) by mouth daily. HOLD until 03/26/20 03/24/20   Desiree Hane, MD  sucralfate (CARAFATE) 1 g tablet Take 1 tablet (1 g total) by mouth 4 (four) times daily -  with meals and at bedtime. 07/31/20   Bonnielee Haff, MD  thiamine 100 MG tablet Take 1 tablet (100 mg total) by mouth daily. 07/31/20   Bonnielee Haff, MD    Physical Exam: Vitals:   09/01/20 2345 09/02/20 0000 09/02/20 0015 09/02/20 0030  BP: (!) 133/100 (!) 123/102 (!) 138/103 (!) 139/98  Pulse: 91 95 98 90  Resp:      Temp:      TempSrc:      SpO2: 90% 100% 100% (!) 81%  Weight:      Height:          Constitutional: Acutely ill looking, tremulous, no distress Vitals:   09/01/20 2345 09/02/20 0000 09/02/20 0015 09/02/20 0030  BP: (!) 133/100 (!) 123/102 (!) 138/103 (!) 139/98  Pulse: 91 95 98 90  Resp:      Temp:      TempSrc:      SpO2: 90% 100% 100% (!) 81%  Weight:      Height:       Eyes: PERRL, lids and conjunctivae normal ENMT: Mucous membranes are moist. Posterior pharynx clear of any exudate or lesions.Normal dentition.  Neck: normal, supple, no masses, no thyromegaly Respiratory: clear to auscultation bilaterally, no wheezing, no crackles. Normal respiratory effort. No accessory muscle use.  Cardiovascular: Sinus tachycardia no murmurs / rubs / gallops. No extremity edema. 2+ pedal  pulses. No carotid bruits.  Abdomen: no tenderness, no masses palpated. No hepatosplenomegaly. Bowel sounds positive.  Musculoskeletal: no clubbing / cyanosis. No joint deformity upper and lower extremities. Good ROM, no contractures. Normal muscle tone.  Skin: no rashes, lesions, ulcers. No induration Neurologic: CN 2-12 grossly intact. Sensation intact, DTR normal. Strength 5/5 in all 4.  Tremulous Psychiatric: Mildly impaired judgment and insight. Alert and oriented x 3.  Anxious mood.     Labs on Admission: I have personally reviewed following labs and imaging studies  CBC: Recent Labs  Lab 09/01/20 2243  WBC 7.4  HGB 12.6*  HCT 37.3*  MCV 90.8  PLT 952   Basic Metabolic Panel: Recent Labs  Lab 09/01/20 2234  NA 140  K 3.3*  CL 94*  CO2 23  GLUCOSE 173*  BUN 16  CREATININE 1.53*  CALCIUM 9.6   GFR: Estimated Creatinine Clearance: 67.6 mL/min (A) (by C-G formula based on SCr of 1.53 mg/dL (H)). Liver Function Tests: Recent Labs  Lab 09/01/20 2234  AST 68*  ALT 72*  ALKPHOS 59  BILITOT 1.1  PROT 8.5*  ALBUMIN 4.8   No results for input(s): LIPASE, AMYLASE in the last 168 hours. No results for input(s): AMMONIA in the last 168 hours. Coagulation Profile: No results for input(s): INR, PROTIME in the last 168 hours. Cardiac Enzymes: No results for input(s): CKTOTAL, CKMB, CKMBINDEX, TROPONINI in the last 168 hours. BNP (last 3 results) No results for input(s): PROBNP in the last 8760 hours. HbA1C: No results for input(s): HGBA1C in the last 72 hours. CBG: Recent Labs  Lab 09/02/20 0038  GLUCAP 195*   Lipid Profile: No results for input(s): CHOL, HDL, LDLCALC, TRIG, CHOLHDL, LDLDIRECT in the last 72 hours. Thyroid Function Tests: No results for input(s): TSH, T4TOTAL, FREET4, T3FREE, THYROIDAB in the last 72 hours. Anemia Panel: No results for input(s): VITAMINB12, FOLATE, FERRITIN, TIBC, IRON, RETICCTPCT in the last 72 hours. Urine analysis:     Component Value Date/Time   COLORURINE YELLOW 07/28/2020 0005   APPEARANCEUR CLEAR 07/28/2020 0005   LABSPEC 1.023 07/28/2020 0005   PHURINE 5.0 07/28/2020 0005   GLUCOSEU >=500 (A) 07/28/2020 0005   HGBUR SMALL (A) 07/28/2020 0005   BILIRUBINUR NEGATIVE 07/28/2020 0005   KETONESUR 20 (A) 07/28/2020 0005   PROTEINUR 100 (A) 07/28/2020 0005   NITRITE NEGATIVE 07/28/2020 0005   LEUKOCYTESUR NEGATIVE 07/28/2020 0005   Sepsis Labs: @LABRCNTIP (procalcitonin:4,lacticidven:4) ) Recent Results (from the past 240 hour(s))  Resp Panel by RT-PCR (Flu A&B, Covid) Nasopharyngeal Swab     Status: None   Collection Time: 09/01/20 10:35 PM   Specimen: Nasopharyngeal Swab; Nasopharyngeal(NP) swabs in vial transport medium  Result Value Ref Range Status   SARS Coronavirus 2 by RT PCR NEGATIVE NEGATIVE Final    Comment: (NOTE) SARS-CoV-2 target nucleic acids are NOT DETECTED.  The SARS-CoV-2 RNA is generally detectable in upper respiratory specimens during the acute phase of infection. The lowest concentration of SARS-CoV-2 viral copies this assay can detect is 138 copies/mL. A negative result does not preclude SARS-Cov-2 infection and should not be used as the sole basis for treatment or other patient management decisions. A negative result may occur with  improper specimen collection/handling, submission of specimen other than nasopharyngeal swab, presence of viral mutation(s) within the areas targeted by this assay, and inadequate number of viral copies(<138 copies/mL). A negative result must be combined with clinical observations, patient history, and epidemiological information. The expected result is Negative.  Fact Sheet for Patients:  EntrepreneurPulse.com.au  Fact Sheet for Healthcare Providers:  IncredibleEmployment.be  This test is no t yet approved or cleared by the [place] FDA and  has been authorized for detection and/or diagnosis of  SARS-CoV-2 by FDA under an Emergency Use Authorization (EUA). This EUA will remain  in effect (meaning this test can be used) for the duration of the COVID-19 declaration under Section 564(b)(1) of the Act, 21 U.S.C.section 360bbb-3(b)(1), unless the authorization is terminated  or revoked sooner.       Influenza A by PCR NEGATIVE NEGATIVE Final   Influenza B by PCR NEGATIVE NEGATIVE Final    Comment: (NOTE) The Xpert Xpress SARS-CoV-2/FLU/RSV plus assay is intended as an aid in the diagnosis of influenza from Nasopharyngeal swab  specimens and should not be used as a sole basis for treatment. Nasal washings and aspirates are unacceptable for Xpert Xpress SARS-CoV-2/FLU/RSV testing.  Fact Sheet for Patients: EntrepreneurPulse.com.au  Fact Sheet for Healthcare Providers: IncredibleEmployment.be  This test is not yet approved or cleared by the [place] FDA and has been authorized for detection and/or diagnosis of SARS-CoV-2 by FDA under an Emergency Use Authorization (EUA). This EUA will remain in effect (meaning this test can be used) for the duration of the COVID-19 declaration under Section 564(b)(1) of the Act, 21 U.S.C. section 360bbb-3(b)(1), unless the authorization is terminated or revoked.  Performed at [facility], [place] [address], [place], [place] [address]      Radiological Exams on Admission: DG Chest 2 View  Result Date: 09/01/2020 CLINICAL DATA:  Chest pain EXAM: CHEST - 2 VIEW COMPARISON:  None. FINDINGS: The heart size and mediastinal contours are within normal limits. Both lungs are clear. The visualized skeletal structures are unremarkable. IMPRESSION: No active cardiopulmonary disease. Electronically Signed   By: Ulyses Jarred M.D.   On: 09/01/2020 22:38    EKG: Independently reviewed.  It shows sinus tachycardia with a rate of 130s.  Diffuse T wave inversion in the lateral leads.  Evidence of LVH  by voltage criteria.  Unchanged from previous  Assessment/Plan Principal Problem:   Delirium tremens ([place]) Active Problems:   Tobacco abuse   Chronic systolic CHF (congestive heart failure) ([facility])   Inappropriate sinus tachycardia   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[place])     #1 delirium tremens: Patient will be placed on CIWA protocol.  Ativan protocol with folic acid and thiamine supplementation.  Will not give fluids due to patient's systolic dysfunction.  #2 chronic systolic dysfunction: Last EF of 30% from February of this year.  We will continue with his cardiac medication including Entresto, Coreg as well as digoxin.  Check digoxin level  #3 tobacco abuse: Nicotine patch.  Counseling  #4 alcohol abuse: Counseling  #5 hypokalemia: Replete potassium.  Check magnesium level.  #6 AKI: Mild.  Probably secondary to dehydration.  He is also on diuretics.  Continue to monitor   DVT prophylaxis: Heparin Code Status: Full code Family Communication: No family at bedside Disposition Plan: Home Consults called: None Admission status: Inpatient  Severity of Illness: The appropriate patient status for this patient is INPATIENT. Inpatient status is judged to be reasonable and necessary in order to provide the required intensity of service to ensure the patient's safety. The patient's presenting symptoms, physical exam findings, and initial radiographic and laboratory data in the context of their chronic comorbidities is felt to place them at high risk for further clinical deterioration. Furthermore, it is not anticipated that the patient will be medically stable for discharge from the hospital within 2 midnights of admission. The following factors support the patient status of inpatient.   " The patient's presenting symptoms include tremors and chest discomfort. " The worrisome physical exam findings include sinus tachycardia. " The initial radiographic and  laboratory data are worrisome because of alcohol level less than 10 and hypokalemia. " The chronic co-morbidities include alcohol abuse with systolic dysfunction CHF.   * I certify that at the point of admission it is my clinical judgment that the patient will require inpatient hospital care spanning beyond 2 midnights from the point of admission due to high intensity of service, high risk for further deterioration and high frequency of surveillance required.Barbette Merino  MD [facility] Pager [phone]  If 7PM-7AM, please contact night-coverage [email or web address] Password [facility]  09/02/2020, 12:53 AM

## 2020-09-02 NOTE — Discharge Instructions (Signed)
                  Intensive Outpatient Programs  [facility]    [facility] [address]     [address] [place],  [place]     [place], [place] [phone]      [phone]  [facility]   [facility]  (Inpatient and outpatient)  [phone] (Suboxone and Methadone) [address]           [phone]           [facility]    [facility] - Intensive Outpatient [address]     [address] [place], [place] [address]     [place], [place]  [phone]      [phone]  [facility] (Outpatient, Inpatient, [facility]  [facility] (Groups and [facility]) (insurance only) [phone]    [place], [place]          [phone]       [facility]    [facility] (for caregivers and family) [address]     [address] [place], [place]     [place], [place] [phone]      [phone]  Residential Treatment Programs  [facility]  [facility](2 years) Residential: 90 days)  [facility] [address]      [address] [place], [place]     [place], [place] [phone]      [phone] or [phone]  [facility]    [facility] [address]      [address] , [place]     , [place] [phone]      [phone]  [facility]   [facility] ([facility] [address]     [address] [place], [place] [address]     [place], [place] [phone]      [phone] Admissions: 8am-3pm M-F  [facility]: Residential Program (90 Days)              [facility]: [facility]  [place], [place]     [place],   [phone] or [phone]    (Walk in Hours over the weekend or by referral)   [place] [facility]: [facility]:[phone] (for crisis  response 24 hours a day) 

## 2020-09-07 ENCOUNTER — Other Ambulatory Visit (HOSPITAL_COMMUNITY): Payer: Self-pay | Admitting: Internal Medicine

## 2020-09-11 ENCOUNTER — Emergency Department (HOSPITAL_COMMUNITY): Payer: BC Managed Care – PPO

## 2020-09-11 ENCOUNTER — Emergency Department (HOSPITAL_COMMUNITY)
Admission: EM | Admit: 2020-09-11 | Discharge: 2020-09-11 | Disposition: A | Discharge status: Home / Self Care | Payer: BC Managed Care – PPO | Attending: Emergency Medicine | Admitting: Emergency Medicine

## 2020-09-11 ENCOUNTER — Encounter (HOSPITAL_COMMUNITY): Payer: Self-pay

## 2020-09-11 ENCOUNTER — Other Ambulatory Visit: Payer: Self-pay

## 2020-09-11 ENCOUNTER — Ambulatory Visit (INDEPENDENT_AMBULATORY_CARE_PROVIDER_SITE_OTHER): Payer: BC Managed Care – PPO | Admitting: Cardiovascular Disease

## 2020-09-11 ENCOUNTER — Encounter: Payer: Self-pay | Admitting: Cardiovascular Disease

## 2020-09-11 VITALS — BP 142/90 | HR 57 | Ht 70.0 in | Wt 191.6 lb

## 2020-09-11 DIAGNOSIS — I5022 Chronic systolic (congestive) heart failure: Secondary | ICD-10-CM | POA: Diagnosis not present

## 2020-09-11 DIAGNOSIS — J45909 Unspecified asthma, uncomplicated: Secondary | ICD-10-CM | POA: Diagnosis not present

## 2020-09-11 DIAGNOSIS — R072 Precordial pain: Secondary | ICD-10-CM | POA: Insufficient documentation

## 2020-09-11 DIAGNOSIS — Z79899 Other long term (current) drug therapy: Secondary | ICD-10-CM | POA: Insufficient documentation

## 2020-09-11 DIAGNOSIS — R0602 Shortness of breath: Secondary | ICD-10-CM | POA: Insufficient documentation

## 2020-09-11 DIAGNOSIS — R0789 Other chest pain: Secondary | ICD-10-CM | POA: Diagnosis not present

## 2020-09-11 DIAGNOSIS — Z20822 Contact with and (suspected) exposure to covid-19: Secondary | ICD-10-CM | POA: Diagnosis not present

## 2020-09-11 DIAGNOSIS — F10229 Alcohol dependence with intoxication, unspecified: Secondary | ICD-10-CM | POA: Diagnosis not present

## 2020-09-11 DIAGNOSIS — F10931 Alcohol use, unspecified with withdrawal delirium: Secondary | ICD-10-CM

## 2020-09-11 DIAGNOSIS — F10231 Alcohol dependence with withdrawal delirium: Secondary | ICD-10-CM

## 2020-09-11 DIAGNOSIS — F1910 Other psychoactive substance abuse, uncomplicated: Secondary | ICD-10-CM

## 2020-09-11 DIAGNOSIS — Z720 Tobacco use: Secondary | ICD-10-CM

## 2020-09-11 DIAGNOSIS — F17210 Nicotine dependence, cigarettes, uncomplicated: Secondary | ICD-10-CM | POA: Insufficient documentation

## 2020-09-11 DIAGNOSIS — R1013 Epigastric pain: Secondary | ICD-10-CM | POA: Insufficient documentation

## 2020-09-11 LAB — CBC
HCT: 40 % (ref 39.0–52.0)
Hemoglobin: 13 g/dL (ref 13.0–17.0)
MCH: 30 pg (ref 26.0–34.0)
MCHC: 32.5 g/dL (ref 30.0–36.0)
MCV: 92.2 fL (ref 80.0–100.0)
Platelets: 282 10*3/uL (ref 150–400)
RBC: 4.34 MIL/uL (ref 4.22–5.81)
RDW: 13.8 % (ref 11.5–15.5)
WBC: 4.1 10*3/uL (ref 4.0–10.5)
nRBC: 0 % (ref 0.0–0.2)

## 2020-09-11 LAB — COMPREHENSIVE METABOLIC PANEL
ALT: 49 U/L — ABNORMAL HIGH (ref 0–44)
AST: 49 U/L — ABNORMAL HIGH (ref 15–41)
Albumin: 4.3 g/dL (ref 3.5–5.0)
Alkaline Phosphatase: 56 U/L (ref 38–126)
Anion gap: 12 (ref 5–15)
BUN: 15 mg/dL (ref 6–20)
CO2: 22 mmol/L (ref 22–32)
Calcium: 9 mg/dL (ref 8.9–10.3)
Chloride: 102 mmol/L (ref 98–111)
Creatinine, Ser: 1.05 mg/dL (ref 0.61–1.24)
GFR, Estimated: >60 mL/min (ref >60)
Glucose, Bld: 152 mg/dL — ABNORMAL HIGH (ref 70–99)
Potassium: 3.9 mmol/L (ref 3.5–5.1)
Sodium: 136 mmol/L (ref 135–145)
Total Bilirubin: 0.8 mg/dL (ref 0.3–1.2)
Total Protein: 7.9 g/dL (ref 6.5–8.1)

## 2020-09-11 LAB — RESP PANEL BY RT-PCR (FLU A&B, COVID) ARPGX2
Influenza A by PCR: NEGATIVE
Influenza B by PCR: NEGATIVE
SARS Coronavirus 2 by RT PCR: NEGATIVE

## 2020-09-11 LAB — LIPASE, BLOOD: Lipase: 31 U/L (ref 11–51)

## 2020-09-11 LAB — ETHANOL: Alcohol, Ethyl (B): <10 mg/dL (ref <10)

## 2020-09-11 LAB — TROPONIN I (HIGH SENSITIVITY)
Troponin I (High Sensitivity): 11 ng/L (ref <18)
Troponin I (High Sensitivity): 10 ng/L (ref <18)

## 2020-09-11 MED ORDER — LORAZEPAM 2 MG/ML IJ SOLN
1.0000 mg | Freq: Once | INTRAMUSCULAR | Status: AC
  Administered 2020-09-11: 1 mg via INTRAVENOUS
  Filled 2020-09-11: qty 1

## 2020-09-11 NOTE — Discharge Instructions (Signed)
Please return for any problem.  Please use Librium as instructed.   Please take Protonix as previously prescribed.

## 2020-09-11 NOTE — Progress Notes (Signed)
.    Cardiology Office Note   Date:  09/11/2020   ID:  Robert [name], DOB 1981/03/05, MRN [id number]  PCP:  Robert Buffy, NP  Cardiologist:   Robert Latch, MD   No chief complaint on file.     History of Present Illness: Robert [name] is a 40 y.o. male with chronic systolic congestive heart failure, left alcventricular noncompaction, ohol abuse, cocaine use, pulmonary embolism, tobacco abuse who presents for follow-up.  He was initially admitted 09/2019.  With new onset biventricular heart failure.  LVEF was 15% and his right is well.  At that time he was drinking alcohol heavily.  He noted left heart catheterization that revealed normal coronaries, normal filling pressures, and normal cardiac output.  He was started on milrinone and diuresed with IV Lasix.  Cardiac MRI revealed LVEF 14% and left ventricular noncompaction.  He has been followed by the advanced heart failure team.  He has been admitted to the hospital multiple times with alcohol, including admission for delirium tremens with seizures.  He was admitted 07/2019 with nausea, vomiting, and abdominal pain.  An upper endoscopy was recommended but he declined.  Both Xarelto and Zoloft were discontinued.  He last saw the heart failure service on 07/2020 and was feeling well.  He had not had any alcohol in the week prior to that.  He has not had any alcohol in a week but prior to that was drinking 5-6 bottles daily.  He was out of his spironolactone and Entresto due to cost.  He has been receiving services from the heart failure.  Medicine and pharmacy teams.  He lives with his mother and requires assistance with transportation.  Mr. Fikes started having chest pain last night.  It is worse when laying down though it is sometimes present when sitting upright.  It does not change with exertion.  The discomfort is central and sometimes radiates to the left.  He has been short of breath for few days and reports some productive  cough.  He has a history of asthma but has not needed an inhaler in years.  He denies fevers or chills.  He has not had any hemoptysis since his last hospitalization.  He reports that this visit was scheduled when he was discharged from the hospital.  He has had episodes of hemoptysis in the past but has declined EGD.  He notes that he saw the dentist and was started on antibiotics and has been taking ibuprofen several times per day.  He has not noted any melena or hematochezia.  He started having shakes yesterday.  His last drink was a few days ago.  He denies any lower extremity edema, orthopnea, or PND.  He has a blood pressure cuff at home but does not check his blood pressure regularly.   Past Medical History:  Diagnosis Date  . Alcohol use   . Alcohol withdrawal ([place]) 03/06/2020  . Asthma   . CHF (congestive heart failure) ([name])     Past Surgical History:  Procedure Laterality Date  . ESOPHAGOGASTRODUODENOSCOPY (EGD) WITH PROPOFOL N/A 11/01/2019   Procedure: ESOPHAGOGASTRODUODENOSCOPY (EGD) WITH PROPOFOL;  Surgeon: Robert Horner, MD;  Location: [facility] ENDOSCOPY;  Service: Endoscopy;  Laterality: N/A;  . RIGHT/LEFT HEART CATH AND CORONARY ANGIOGRAPHY N/A 10/06/2019   Procedure: RIGHT/LEFT HEART CATH AND CORONARY ANGIOGRAPHY;  Surgeon: Robert Sine, MD;  Location: [facility] CV LAB;  Service: Cardiovascular;  Laterality: N/A;  . WRIST SURGERY  Current Outpatient Medications  Medication Sig Dispense Refill  . carvedilol (COREG) 3.125 MG tablet Take 1 tablet (3.125 mg total) by mouth 2 (two) times daily. 60 tablet 11  . digoxin (LANOXIN) 0.125 MG tablet Take 1 tablet (0.125 mg total) by mouth daily. 30 tablet 5  . FARXIGA 10 MG TABS tablet TAKE 1 TABLET BY MOUTH ONCE DAILY BEFORE BREAKFAST 60 tablet 1  . folic acid (FOLVITE) 1 MG tablet Take 1 tablet (1 mg total) by mouth daily. 30 tablet 3  . ivabradine (CORLANOR) 7.5 MG TABS tablet Take 1 tablet (7.5 mg total) by mouth 2 (two)  times daily with a meal. 60 tablet 3  . LORazepam (ATIVAN) 1 MG tablet Take 1 tablet (1 mg total) by mouth every 6 (six) hours as needed for anxiety. 10 tablet 0  . ondansetron (ZOFRAN ODT) 4 MG disintegrating tablet Take 1 tablet (4 mg total) by mouth every 8 (eight) hours as needed for nausea or vomiting. 20 tablet 0  . pantoprazole (PROTONIX) 40 MG tablet Take 1 tablet (40 mg total) by mouth daily. 30 tablet 0  . potassium chloride SA (KLOR-CON) 20 MEQ tablet Take 20 mEq by mouth daily.    . QUEtiapine (SEROQUEL) 100 MG tablet Take 1.5 tablets (150 mg total) by mouth daily. 180 tablet 3  . sacubitril-valsartan (ENTRESTO) 97-103 MG Take 1 tablet by mouth 2 (two) times daily. 60 tablet 11  . sertraline (ZOLOFT) 25 MG tablet Take 25 mg by mouth daily as needed (anxiety).    [name] spironolactone (ALDACTONE) 25 MG tablet Take 1 tablet (25 mg total) by mouth daily. HOLD until 03/26/20 90 tablet 3  . sucralfate (CARAFATE) 1 g tablet Take 1 tablet (1 g total) by mouth 4 (four) times daily -  with meals and at bedtime. 120 tablet 0  . thiamine 100 MG tablet Take 1 tablet (100 mg total) by mouth daily. 30 tablet 0   No current facility-administered medications for this visit.    Allergies:   Bactrim [sulfamethoxazole-trimethoprim]    Social History:  The patient  reports that he has been smoking cigarettes. He has a 6.00 pack-year smoking history. He has never used smokeless tobacco. He reports current alcohol use of about 8.0 standard drinks of alcohol per week. He reports current drug use. Frequency: 1.00 time per week. Drug: Marijuana.   Family History:  The patient's family history includes Diabetes in his father; Heart disease (age of onset: 20) in his father; Heart disease (age of onset: 37) in his maternal grandfather and paternal grandfather; Heart failure in his father; Kidney disease in his father.    ROS:  Please see the history of present illness.   Otherwise, review of systems are positive  for none.   All other systems are reviewed and negative.    PHYSICAL EXAM: VS:  BP (!) 142/90   Pulse (!) 57   Ht 5\' 10"  (1.778 m)   Wt 191 lb 9.6 oz (86.9 kg)   BMI 27.49 kg/m  , BMI Body mass index is 27.49 kg/m. GENERAL:  Well appearing HEENT:  Pupils equal round and reactive, fundi not visualized, oral mucosa unremarkable NECK:  No jugular venous distention, waveform within normal limits, carotid upstroke brisk and symmetric, no bruits LUNGS:  Diffuse expiratory wheezes HEART:  RRR.  PMI not displaced or sustained,S1 and S2 within normal limits, no S3, no S4, no clicks, no rubs, no murmurs ABD:  Flat, positive bowel sounds normal in frequency in pitch,  no bruits, no rebound, no guarding, no midline pulsatile mass, no hepatomegaly, no splenomegaly EXT:  2 plus pulses throughout, no edema, no cyanosis no clubbing SKIN:  No rashes no nodules NEURO:  Cranial nerves II through XII grossly intact, motor grossly intact throughout.  Tremulous PSYCH: Mildly anxious.  Cognitively intact, oriented to person place and time   EKG:  EKG is ordered today. The ekg ordered today demonstrates sinus bradycardia.  Rate 57 bpm.  Anterolateral T wave inversion.  Echo (03/10/20) EF 30-35% RV normal. No effusion.  ECHO 10/12/2019 Pericardial effusion remains moderate in size. No tamponade physiology. ECHO 10/05/2019 EF <20%  Prominent trabeculation. Grade III DD  R/LHC5/25/2021 showednormal coronaries, elevated filling pressures with normal output.  ECHO 06/2020 EF 30%  cMRI 10/08/19 showedNoncompaction- LVEF 14% RVEF 16%  Severely dialted R/L atrium  Recent Labs: 03/23/2020: TSH 0.934 09/01/2020: B Natriuretic Peptide 141.3 09/02/2020: ALT 63; BUN 16; Creatinine, Ser 1.60; Hemoglobin 11.6; Magnesium 1.3; Platelets 218; Potassium 3.4; Sodium 137    Lipid Panel No results found for: CHOL, TRIG, HDL, CHOLHDL, VLDL, LDLCALC, LDLDIRECT    Wt Readings from Last 3 Encounters:  09/11/20 191 lb 9.6 oz  (86.9 kg)  09/01/20 185 lb (83.9 kg)  08/19/20 185 lb 6.5 oz (84.1 kg)      ASSESSMENT AND PLAN:  #Chronic systolic congestive heart failure: Nonischemic cardiomyopathy.  He had normal coronary arteries on cath in 2021.  He also has left ventricular noncompaction.  The etiology is likely related to this as well as continued polysubstance abuse alcohol and cocaine.  Mr. Molyneux is euvolemic on exam.  Blood pressure slightly above goal but he has not yet taken off his medication.  We will not make any changes to his medications.  Continue carvedilol, digoxin, ivabradine, Entresto, spironolactone, and [place].  #Polysubstance abuse: # Alcohol withdrawal: History of chronic alcohol and cocaine use.  Also smokes cigarettes.  Multiple admissions for alcohol withdrawal.  He is currently tremulous and anxious.  His last drink was 3 days ago.  He has a history of delirium tremens and seizure.  Recommend that he be seen in the emergency department.  # Syncope: No recent episodes.  # GIB: # Chest pain: Erythema was noted in the stomach and duodenum on prior EGD in 2021.  He has had hematemesis since then and has not had recurrent imaging.  He has been on ibuprofen and is now having chest pain.  I do not think this is ischemic given his normal coronaries last year.  I suspect that he has gastritis.  Recommend that he be seen in the ED. he was previously on Xarelto for pulmonary embolism and this has been discontinued.   Current medicines are reviewed at length with the patient today.  The patient does not have concerns regarding medicines.  The following changes have been made:  no change  Labs/ tests ordered today include:  No orders of the defined types were placed in this encounter.    Disposition:   FU with advanced heart failure team in 1 month.  I saw him in the hospital at the time of his diagnosis but he has been following since that time.    Signed, Rakan Soffer C. Oval Linsey, MD, [facility]   09/11/2020 8:28 AM    [facility]

## 2020-09-11 NOTE — ED Notes (Signed)
Pt reminded we need a urine sample. Pt denies urge to urinate

## 2020-09-11 NOTE — ED Notes (Signed)
Pt to Xray.

## 2020-09-11 NOTE — ED Notes (Signed)
Pt unable to give urine sample at this time will info staff when able

## 2020-09-11 NOTE — ED Provider Notes (Signed)
[facility] DEPT Provider Note   CSN: [id number] Arrival date & time: 09/11/20  0834     History Chief Complaint  Patient presents with  . Chest Pain  . Shortness of Breath    Robert [name] is a 40 y.o. male.  40 year old male with prior medical history as detailed below presents for evaluation.  Patient is complaining of diffuse anterior chest pain with some radiation to the left chest wall.  Patient's symptoms been ongoing since yesterday.  Of note, patient was seen by cardiology in clinic this morning.  Cardiology suspects that his symptoms are likely secondary to gastric irritation given history of alcohol consumption and ibuprofen use.  Patient was sent to the ED for further evaluation.  Patient denies associated shortness of breath or diaphoresis.  Patient denies fever.  Patient denies cough.  Patient reports that he drinks regularly.  His last EtOH use was 2 days ago.  He does report prior history of significant withdrawal with seizure.  The history is provided by the patient and medical records.  Chest Pain Pain location:  Substernal area and epigastric Pain quality: aching   Pain severity:  Mild Onset quality:  Gradual Duration:  1 day Timing:  Intermittent Progression:  Waxing and waning Chronicity:  New Relieved by:  Nothing Worsened by:  Nothing Associated symptoms: shortness of breath   Shortness of Breath Associated symptoms: chest pain        Past Medical History:  Diagnosis Date  . Alcohol use   . Alcohol withdrawal ([place]) 03/06/2020  . Asthma   . CHF (congestive heart failure) [facility])     Patient Active Problem List   Diagnosis Date Noted  . Delirium tremens ([place]) 09/02/2020  . Hypomagnesemia 08/19/2020  . Seizure ([place]) 07/10/2020  . Mood disorder ([place]) 05/16/2020  . Hypotension 03/24/2020  . Polysubstance abuse ([place]) 03/24/2020  . Nausea & vomiting 03/06/2020  . AKI (acute kidney injury) ([facility]) 10/30/2019  .  Gastritis 10/30/2019  . Elevated INR 10/30/2019  . 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[place])   . Acute heart failure ([place]) 10/05/2019  . Pulmonary embolism ([place]) 10/04/2019  . Chronic systolic CHF (congestive heart failure) ([place])   . Inappropriate sinus tachycardia   . Chest pain 11/03/2013  . Tobacco abuse 11/03/2013    Past Surgical History:  Procedure Laterality Date  . ESOPHAGOGASTRODUODENOSCOPY (EGD) WITH PROPOFOL N/A 11/01/2019   Procedure: ESOPHAGOGASTRODUODENOSCOPY (EGD) WITH PROPOFOL;  Surgeon: [name], MD;  Location: [facility] ENDOSCOPY;  Service: Endoscopy;  Laterality: N/A;  . RIGHT/LEFT HEART CATH AND CORONARY ANGIOGRAPHY N/A 10/06/2019   Procedure: RIGHT/LEFT HEART CATH AND CORONARY ANGIOGRAPHY;  Surgeon: [name], MD;  Location: [facility] CV LAB;  Service: Cardiovascular;  Laterality: N/A;  . WRIST SURGERY         Family History  Problem Relation Age of Onset  . Heart failure Father   . Diabetes Father   . Kidney disease Father   . Heart disease Father 85  . Heart disease Maternal Grandfather 74  . Heart disease Paternal Grandfather 39    Social History   Tobacco Use  . Smoking status: Current Every Day Smoker    Packs/day: 0.25    Years: 24.00    Pack years: 6.00    Types: Cigarettes  . Smokeless tobacco: Never Used  Vaping Use  . Vaping Use: Former  Substance Use Topics  . Alcohol use: Yes    Alcohol/week: 8.0 standard drinks  Types: 8 Shots of liquor per week    Comment: heavy use since age 19  . Drug use: Yes    Frequency: 1.0 times per week    Types: Marijuana    Home Medications Prior to Admission medications   Medication Sig Start Date End Date Taking? Authorizing Provider  carvedilol (COREG) 3.125 MG tablet Take 1 tablet (3.125 mg total) by mouth 2 (two) times daily. 04/11/20 04/11/21  Clegg, Amy D, NP  digoxin (LANOXIN) 0.125 MG tablet Take 1 tablet (0.125 mg total) by mouth daily. 06/14/20   Simmons,  Brittainy M, PA-C  FARXIGA 10 MG TABS tablet TAKE 1 TABLET BY MOUTH ONCE DAILY BEFORE BREAKFAST 09/07/20   Bensimhon, Shaune Pascal, MD  folic acid (FOLVITE) 1 MG tablet Take 1 tablet (1 mg total) by mouth daily. 03/16/20   Bensimhon, Shaune Pascal, MD  ivabradine (CORLANOR) 7.5 MG TABS tablet Take 1 tablet (7.5 mg total) by mouth 2 (two) times daily with a meal. 03/27/20   Bensimhon, Shaune Pascal, MD  LORazepam (ATIVAN) 1 MG tablet Take 1 tablet (1 mg total) by mouth every 6 (six) hours as needed for anxiety. 08/20/20   Arrien, Jimmy Picket, MD  ondansetron (ZOFRAN ODT) 4 MG disintegrating tablet Take 1 tablet (4 mg total) by mouth every 8 (eight) hours as needed for nausea or vomiting. 08/20/20   Arrien, Jimmy Picket, MD  pantoprazole (PROTONIX) 40 MG tablet Take 1 tablet (40 mg total) by mouth daily. 08/20/20 09/19/20  Arrien, Jimmy Picket, MD  potassium chloride SA (KLOR-CON) 20 MEQ tablet Take 20 mEq by mouth daily.    [provider]  QUEtiapine (SEROQUEL) 100 MG tablet Take 1.5 tablets (150 mg total) by mouth daily. 06/23/20   Nche, Charlene Brooke, NP  sacubitril-valsartan (ENTRESTO) 97-103 MG Take 1 tablet by mouth 2 (two) times daily. 06/16/20   Lyda Jester M, PA-C  sertraline (ZOLOFT) 25 MG tablet Take 25 mg by mouth daily as needed (anxiety).    [provider]  spironolactone (ALDACTONE) 25 MG tablet Take 1 tablet (25 mg total) by mouth daily. HOLD until 03/26/20 03/24/20   Desiree Hane, MD  sucralfate (CARAFATE) 1 g tablet Take 1 tablet (1 g total) by mouth 4 (four) times daily -  with meals and at bedtime. 07/31/20   Bonnielee Haff, MD  thiamine 100 MG tablet Take 1 tablet (100 mg total) by mouth daily. 07/31/20   Bonnielee Haff, MD    Allergies    Bactrim [sulfamethoxazole-trimethoprim]  Review of Systems   Review of Systems  Respiratory: Positive for shortness of breath.   Cardiovascular: Positive for chest pain.  All other systems reviewed and are  negative.   Physical Exam Updated Vital Signs BP (!) 135/101   Pulse 79   Temp 98.2 F (36.8 C) (Oral)   Resp 16   Ht 5\' 10"  (1.778 m)   Wt 86.6 kg   SpO2 94%   BMI 27.41 kg/m   Physical Exam Vitals and nursing note reviewed.  Constitutional:      General: He is not in acute distress.    Appearance: He is well-developed.  HENT:     Head: Normocephalic and atraumatic.  Eyes:     Conjunctiva/sclera: Conjunctivae normal.     Pupils: Pupils are equal, round, and reactive to light.  Cardiovascular:     Rate and Rhythm: Normal rate and regular rhythm.     Heart sounds: Normal heart sounds.  Pulmonary:     Effort:  Pulmonary effort is normal. No respiratory distress.     Breath sounds: Normal breath sounds.  Abdominal:     General: There is no distension.     Palpations: Abdomen is soft.     Tenderness: There is no abdominal tenderness.  Musculoskeletal:        General: No deformity. Normal range of motion.     Cervical back: Normal range of motion and neck supple.  Skin:    General: Skin is warm and dry.  Neurological:     Mental Status: He is alert and oriented to person, place, and time.     ED Results / Procedures / Treatments   Labs (all labs ordered are listed, but only abnormal results are displayed) Labs Reviewed  COMPREHENSIVE METABOLIC PANEL - Abnormal; Notable for the following components:      Result Value   Glucose, Bld 152 (*)    AST 49 (*)    ALT 49 (*)    All other components within normal limits  RESP PANEL BY RT-PCR (FLU A&B, COVID) ARPGX2  CBC  ETHANOL  LIPASE, BLOOD  RAPID URINE DRUG SCREEN, HOSP PERFORMED  TROPONIN I (HIGH SENSITIVITY)  TROPONIN I (HIGH SENSITIVITY)    EKG EKG Interpretation  Date/Time:  Monday Sep 11 2020 08:47:21 EDT Ventricular Rate:  65 PR Interval:  178 QRS Duration: 102 QT Interval:  530 QTC Calculation: 552 R Axis:   12 Text Interpretation: Sinus rhythm LVH w/ repol abnormalities, possible ischemia Minimal  ST elevation, inferior leads Prolonged QT interval 12 Lead; [place] Confirmed by Milton Ferguson [phone]) on 09/11/2020 8:51:36 AM   Radiology DG Chest 2 View  Result Date: 09/11/2020 CLINICAL DATA:  Chest pain and shortness of breath EXAM: CHEST - 2 VIEW COMPARISON:  09/01/2020 FINDINGS: The heart size and mediastinal contours are within normal limits. Both lungs are clear. The visualized skeletal structures show stable thoracic compression deformity. No acute abnormality noted. IMPRESSION: No active cardiopulmonary disease. Electronically Signed   By: Inez Catalina M.D.   On: 09/11/2020 09:26    Procedures Procedures   Medications Ordered in ED Medications  LORazepam (ATIVAN) injection 1 mg (1 mg Intravenous Given 09/11/20 6568)    ED Course  I have reviewed the triage vital signs and the nursing notes.  Pertinent labs & imaging results that were available during my care of the patient were reviewed by me and considered in my medical decision making (see chart for details).    MDM Rules/Calculators/A&P                          MDM   MSE complete  Ajai Harville was evaluated in Emergency Department on 09/11/2020 for the symptoms described in the history of present illness. He was evaluated in the context of the global COVID-19 pandemic, which necessitated consideration that the patient might be at risk for infection with the SARS-CoV-2 virus that causes COVID-19. Institutional protocols and algorithms that pertain to the evaluation of patients at risk for COVID-19 are in a state of rapid change based on information released by regulatory bodies including the CDC and federal and state organizations. These policies and algorithms were followed during the patient's care in the ED.  Patient presented for evaluation of atypical chest discomfort.  Patient's symptoms are not typical of ACS.  Patient was seen by his cardiologist earlier today and sent to the ED for evaluation.  Cardiologist  impression was that this was likely  gastritis.  EKG is without evidence of acute ischemia.  Troponin x2 is without significant elevation or delta.  Patient with very mild early symptoms of withdrawal.  After ED work-up he feels improved.  He desires discharge.  Patient is without evidence of withdrawal at time of discharge.  Patient reports that he has significant amounts of Librium and Ativan at home that he can take if he decides to not drink.  Patient also reports that he has Protonix at home but he will take.  He understands need for close follow-up with GI.  Strict return precautions given and understood.  Importance of close follow-up is stressed.    Final Clinical Impression(s) / ED Diagnoses Final diagnoses:  Atypical chest pain    Rx / DC Orders ED Discharge Orders    None       Valarie Merino, MD 09/11/20 [phone]

## 2020-09-11 NOTE — ED Notes (Signed)
Pt given cup of water 

## 2020-09-11 NOTE — Patient Instructions (Signed)
Medication Instructions:  Your physician recommends that you continue on your current medications as directed. Please refer to the Current Medication list given to you today.   *If you need a refill on your cardiac medications before your next appointment, please call your pharmacy*  Lab Work: NONE   Testing/Procedures: NONE   Follow-Up: At [facility], you and your health needs are our priority.  As part of our continuing mission to provide you with exceptional heart care, we have created designated Provider Care Teams.  These Care Teams include your primary Cardiologist (physician) and Advanced Practice Providers (APPs -  Physician Assistants and Nurse Practitioners) who all work together to provide you with the care you need, when you need it.  We recommend signing up for the patient portal called "MyChart".  Sign up information is provided on this After Visit Summary.  MyChart is used to connect with patients for Virtual Visits (Telemedicine).  Patients are able to view lab/test results, encounter notes, upcoming appointments, etc.  Non-urgent messages can be sent to your provider as well.   To learn more about what you can do with MyChart, go to [email or web address].    Your next appointment:   1 week(s)  The format for your next appointment:   In Person  Provider:   [place]  Other Instructions  DR [place] RECOMMENDS YOU GO TO [place], [place]

## 2020-09-11 NOTE — ED Triage Notes (Signed)
Patient c/o intermittent mid chest pain and SOB since yesterday. Patient reports a history of heart failure.  Patient states he is having alcohol withdrawal symptoms and states his last drink was 3-4 days ago.

## 2020-09-12 ENCOUNTER — Other Ambulatory Visit: Payer: Self-pay

## 2020-09-12 NOTE — Addendum Note (Signed)
Addended by: Vennie Homans on: 09/12/2020 03:40 PM   Modules accepted: Orders

## 2020-09-13 ENCOUNTER — Telehealth (HOSPITAL_COMMUNITY): Payer: Self-pay | Admitting: Psychiatry

## 2020-09-13 ENCOUNTER — Encounter: Payer: Self-pay | Admitting: Nurse Practitioner

## 2020-09-13 ENCOUNTER — Ambulatory Visit: Payer: BC Managed Care – PPO | Admitting: Nurse Practitioner

## 2020-09-13 VITALS — BP 100/60 | HR 84 | Temp 97.4°F | Ht 70.0 in | Wt 191.4 lb

## 2020-09-13 DIAGNOSIS — F39 Unspecified mood [affective] disorder: Secondary | ICD-10-CM

## 2020-09-13 DIAGNOSIS — K2951 Unspecified chronic gastritis with bleeding: Secondary | ICD-10-CM | POA: Diagnosis not present

## 2020-09-13 NOTE — Assessment & Plan Note (Addendum)
Denies any ETOH consumption in last 1week. Admits he does not take seroquel or zoloft or lorazepam as prescribed He has not schedule appt with [facility] IOP. States he feel overburden by his health and multiple medications. He lacks motivation to engage is things he enjoyed previously. He denies any SI or HI or hallucinations. I asked what are his possible triggers to consume ETOH. He stated, he is not sure. I advised him to maybe look at the medications as a way for him to return to some of the positive activities he enjoyed. It may not be same as previous norm, but it could be a new positive norm. I asked about the thinks he enjoyed doing. He stated, "I enjoyed life and going out. I feel old and do not have energy to do much" I encouraged him to self reflect and find one thing he enjoys (not related/associated to ETOH consumption), then try to engage in that activity for the next 4weeks. Encourage to avoid any ETOH consumption and possible triggers. I also encouraged to schedule an appt with BH-IOP program. Continue current medications. He agreed F/up in 4weeks

## 2020-09-13 NOTE — Patient Instructions (Signed)
Call [phone] to schedule appt with psychiatry and therapist.

## 2020-09-13 NOTE — Progress Notes (Signed)
Subjective:  Patient ID: Robert [name], male    DOB: 1981-01-06  Age: 40 y.o. MRN: [id number]  CC: Hospitalization Follow-up [facility] f/u /Pt states he has been doing better since leaving the hospital)  HPI Reviewed ED notes, lab results and radiology report. Mood disorder ([name]) Denies any ETOH consumption in last 1week. Admits he does not take seroquel or zoloft or lorazepam as prescribed He has not schedule appt with Behavioral [facility]. States he feel overburden by his health and multiple medications. He lacks motivation to engage is things he enjoyed previously. He denies any SI or HI or hallucinations. I asked what are his possible triggers to consume ETOH. He stated, he is not sure. I advised him to maybe look at the medications as a way for him to return to some of the positive activities he enjoyed. It may not be same as previous norm, but it could be a new positive norm. I asked about the thinks he enjoyed doing. He stated, "I enjoyed life and going out. I feel old and do not have energy to do much" I encouraged him to self reflect and find one thing he enjoys (not related/associated to ETOH consumption), then try to engage in that activity for the next 4weeks. Encourage to avoid any ETOH consumption and possible triggers. I also encouraged to schedule an appt with BH-IOP program. Continue current medications. He agreed F/up in 4weeks  Gastritis He states he plans to schedule appt with GI to discuss upper endoscopy. He continues to struggle with poor appetite, so he eats I meal per day and no snacks between meals. He denies any nausea or ABD pain with food intake. Nausea and ABD pain occurs when he skips meals. Denies any blood in stool at this time Wt Readings from Last 3 Encounters:  09/13/20 191 lb 6.4 oz (86.8 kg)  09/11/20 191 lb (86.6 kg)  09/11/20 191 lb 9.6 oz (86.9 kg)   Advised to continue sucralfate, pantoprazole and zofran as prescribed, try small frequent  meals, and consider drinking boost 1-2x/day to supplement for missed meals. Schedule appt with GI He agreed  BP Readings from Last 3 Encounters:  09/13/20 100/60  09/11/20 (!) 138/100  09/11/20 (!) 142/90   Wt Readings from Last 3 Encounters:  09/13/20 191 lb 6.4 oz (86.8 kg)  09/11/20 191 lb (86.6 kg)  09/11/20 191 lb 9.6 oz (86.9 kg)    Reviewed past Medical, Social and Family history today.  Outpatient Medications Prior to Visit  Medication Sig Dispense Refill  . carvedilol (COREG) 3.125 MG tablet Take 1 tablet (3.125 mg total) by mouth 2 (two) times daily. 60 tablet 11  . clindamycin (CLEOCIN) 300 MG capsule Take 300 mg by mouth every 6 (six) hours.    . digoxin (LANOXIN) 0.125 MG tablet Take 1 tablet (0.125 mg total) by mouth daily. 30 tablet 5  . FARXIGA 10 MG TABS tablet TAKE 1 TABLET BY MOUTH ONCE DAILY BEFORE BREAKFAST 60 tablet 1  . folic acid (FOLVITE) 1 MG tablet Take 1 tablet (1 mg total) by mouth daily. 30 tablet 3  . ibuprofen (ADVIL) 400 MG tablet 400 mg.    . ivabradine (CORLANOR) 7.5 MG TABS tablet Take 1 tablet (7.5 mg total) by mouth 2 (two) times daily with a meal. 60 tablet 3  . LORazepam (ATIVAN) 1 MG tablet Take 1 tablet (1 mg total) by mouth every 6 (six) hours as needed for anxiety. 10 tablet 0  . ondansetron (ZOFRAN  ODT) 4 MG disintegrating tablet Take 1 tablet (4 mg total) by mouth every 8 (eight) hours as needed for nausea or vomiting. 20 tablet 0  . pantoprazole (PROTONIX) 40 MG tablet Take 1 tablet (40 mg total) by mouth daily. 30 tablet 0  . potassium chloride SA (KLOR-CON) 20 MEQ tablet Take 20 mEq by mouth daily.    . QUEtiapine (SEROQUEL) 100 MG tablet Take 1.5 tablets (150 mg total) by mouth daily. 180 tablet 3  . sacubitril-valsartan (ENTRESTO) 97-103 MG Take 1 tablet by mouth 2 (two) times daily. 60 tablet 11  . sertraline (ZOLOFT) 25 MG tablet Take 25 mg by mouth daily as needed (anxiety).    [name] spironolactone (ALDACTONE) 25 MG tablet Take 1  tablet (25 mg total) by mouth daily. HOLD until 03/26/20 90 tablet 3  . sucralfate (CARAFATE) 1 g tablet Take 1 tablet (1 g total) by mouth 4 (four) times daily -  with meals and at bedtime. 120 tablet 0  . thiamine 100 MG tablet Take 1 tablet (100 mg total) by mouth daily. 30 tablet 0   No facility-administered medications prior to visit.    ROS See HPI  Objective:  BP 100/60 (BP Location: Right Arm, Patient Position: Sitting, Cuff Size: Large)   Pulse 84   Temp (!) 97.4 F (36.3 C) (Temporal)   Ht 5\' 10"  (1.778 m)   Wt 191 lb 6.4 oz (86.8 kg)   SpO2 98%   BMI 27.46 kg/m   Physical Exam Cardiovascular:     Rate and Rhythm: Normal rate and regular rhythm.     Pulses: Normal pulses.     Heart sounds: Normal heart sounds.  Pulmonary:     Effort: Pulmonary effort is normal.     Breath sounds: Normal breath sounds.  Abdominal:     General: Bowel sounds are normal. There is no distension.     Palpations: Abdomen is soft.     Tenderness: There is no abdominal tenderness. There is no guarding.  Musculoskeletal:     Right lower leg: No edema.     Left lower leg: No edema.  Neurological:     Mental Status: He is alert and oriented to person, place, and time.  Psychiatric:        Attention and Perception: Attention normal.        Mood and Affect: Mood is depressed. Affect is flat.        Speech: Speech normal.        Behavior: Behavior is cooperative.        Thought Content: Thought content is not paranoid. Thought content does not include homicidal or suicidal ideation. Thought content does not include homicidal or suicidal plan.        Cognition and Memory: Cognition normal.     Comments: Lack of eye contact    Assessment & Plan:  This visit occurred during the SARS-CoV-2 public health emergency.  Safety protocols were in place, including screening questions prior to the visit, additional usage of staff PPE, and extensive cleaning of exam room while observing appropriate  contact time as indicated for disinfecting solutions.   Robert [name] was seen today for hospitalization follow-up.  Diagnoses and all orders for this visit:  Chronic gastritis with bleeding, unspecified gastritis type  Mood disorder ([place])   Problem List Items Addressed This Visit      Digestive   Gastritis - Primary    He states he plans to schedule appt with GI to discuss upper  endoscopy. He continues to struggle with poor appetite, so he eats I meal per day and no snacks between meals. He denies any nausea or ABD pain with food intake. Nausea and ABD pain occurs when he skips meals. Denies any blood in stool at this time Wt Readings from Last 3 Encounters:  09/13/20 191 lb 6.4 oz (86.8 kg)  09/11/20 191 lb (86.6 kg)  09/11/20 191 lb 9.6 oz (86.9 kg)   Advised to continue sucralfate, pantoprazole and zofran as prescribed, try small frequent meals, and consider drinking boost 1-2x/day to supplement for missed meals. Schedule appt with GI He agreed        Other   Mood disorder ([place])    Denies any ETOH consumption in last 1week. Admits he does not take seroquel or zoloft or lorazepam as prescribed He has not schedule appt with Behavioral Health IOP. States he feel overburden by his health and multiple medications. He lacks motivation to engage is things he enjoyed previously. He denies any SI or HI or hallucinations. I asked what are his possible triggers to consume ETOH. He stated, he is not sure. I advised him to maybe look at the medications as a way for him to return to some of the positive activities he enjoyed. It may not be same as previous norm, but it could be a new positive norm. I asked about the thinks he enjoyed doing. He stated, "I enjoyed life and going out. I feel old and do not have energy to do much" I encouraged him to self reflect and find one thing he enjoys (not related/associated to ETOH consumption), then try to engage in that activity for the next 4weeks.  Encourage to avoid any ETOH consumption and possible triggers. I also encouraged to schedule an appt with BH-IOP program. Continue current medications. He agreed F/up in 4weeks         I have spent 48mins with this patient regarding history taking, documentation, review of ED labs, radiology, ED notes, formulating plan and discussing treatment options with patient.  Follow-up: Return in about 4 weeks (around 10/11/2020) for depression (59mins).  [name], NP

## 2020-09-13 NOTE — Telephone Encounter (Signed)
D:  [name], NP ([place]) called and left vm referring patient to virtual MH-IOP.  A:  Placed call to pt.  Pt answered and stated that he "didn't have time for this right now."  States he will contact the case manager later and he hung up.  Await pt's phone call today.  If cm doesn't hear from pt today, she will call pt tomorrow.

## 2020-09-13 NOTE — Assessment & Plan Note (Addendum)
He states he plans to schedule appt with GI to discuss upper endoscopy. He continues to struggle with poor appetite, so he eats I meal per day and no snacks between meals. He denies any nausea or ABD pain with food intake. Nausea and ABD pain occurs when he skips meals. Denies any blood in stool at this time Wt Readings from Last 3 Encounters:  09/13/20 191 lb 6.4 oz (86.8 kg)  09/11/20 191 lb (86.6 kg)  09/11/20 191 lb 9.6 oz (86.9 kg)   Advised to continue sucralfate, pantoprazole and zofran as prescribed, try small frequent meals, and consider drinking boost 1-2x/day to supplement for missed meals. Schedule appt with GI He agreed

## 2020-09-16 ENCOUNTER — Emergency Department (HOSPITAL_COMMUNITY)
Admission: EM | Admit: 2020-09-16 | Discharge: 2020-09-17 | Disposition: A | Discharge status: Home / Self Care | Payer: BC Managed Care – PPO | Attending: Emergency Medicine | Admitting: Emergency Medicine

## 2020-09-16 ENCOUNTER — Encounter (HOSPITAL_COMMUNITY): Payer: Self-pay | Admitting: Emergency Medicine

## 2020-09-16 ENCOUNTER — Emergency Department (HOSPITAL_COMMUNITY): Payer: BC Managed Care – PPO

## 2020-09-16 ENCOUNTER — Other Ambulatory Visit: Payer: Self-pay

## 2020-09-16 DIAGNOSIS — F17210 Nicotine dependence, cigarettes, uncomplicated: Secondary | ICD-10-CM | POA: Insufficient documentation

## 2020-09-16 DIAGNOSIS — R0602 Shortness of breath: Secondary | ICD-10-CM | POA: Diagnosis not present

## 2020-09-16 DIAGNOSIS — R079 Chest pain, unspecified: Secondary | ICD-10-CM | POA: Diagnosis not present

## 2020-09-16 DIAGNOSIS — I509 Heart failure, unspecified: Secondary | ICD-10-CM | POA: Diagnosis not present

## 2020-09-16 DIAGNOSIS — J45909 Unspecified asthma, uncomplicated: Secondary | ICD-10-CM | POA: Insufficient documentation

## 2020-09-16 DIAGNOSIS — Z79899 Other long term (current) drug therapy: Secondary | ICD-10-CM | POA: Diagnosis not present

## 2020-09-16 DIAGNOSIS — R110 Nausea: Secondary | ICD-10-CM | POA: Insufficient documentation

## 2020-09-16 LAB — COMPREHENSIVE METABOLIC PANEL
ALT: 55 U/L — ABNORMAL HIGH (ref 0–44)
AST: 56 U/L — ABNORMAL HIGH (ref 15–41)
Albumin: 4.1 g/dL (ref 3.5–5.0)
Alkaline Phosphatase: 53 U/L (ref 38–126)
Anion gap: 13 (ref 5–15)
BUN: 16 mg/dL (ref 6–20)
CO2: 25 mmol/L (ref 22–32)
Calcium: 8.9 mg/dL (ref 8.9–10.3)
Chloride: 101 mmol/L (ref 98–111)
Creatinine, Ser: 1.12 mg/dL (ref 0.61–1.24)
GFR, Estimated: >60 mL/min (ref >60)
Glucose, Bld: 218 mg/dL — ABNORMAL HIGH (ref 70–99)
Potassium: 3.4 mmol/L — ABNORMAL LOW (ref 3.5–5.1)
Sodium: 139 mmol/L (ref 135–145)
Total Bilirubin: 0.6 mg/dL (ref 0.3–1.2)
Total Protein: 7.5 g/dL (ref 6.5–8.1)

## 2020-09-16 LAB — CBC WITH DIFFERENTIAL/PLATELET
Abs Immature Granulocytes: 0.02 10*3/uL (ref 0.00–0.07)
Basophils Absolute: 0 10*3/uL (ref 0.0–0.1)
Basophils Relative: 1 %
Eosinophils Absolute: 0 10*3/uL (ref 0.0–0.5)
Eosinophils Relative: 0 %
HCT: 33.9 % — ABNORMAL LOW (ref 39.0–52.0)
Hemoglobin: 11.1 g/dL — ABNORMAL LOW (ref 13.0–17.0)
Immature Granulocytes: 0 %
Lymphocytes Relative: 11 %
Lymphs Abs: 0.6 10*3/uL — ABNORMAL LOW (ref 0.7–4.0)
MCH: 30 pg (ref 26.0–34.0)
MCHC: 32.7 g/dL (ref 30.0–36.0)
MCV: 91.6 fL (ref 80.0–100.0)
Monocytes Absolute: 0.3 10*3/uL (ref 0.1–1.0)
Monocytes Relative: 5 %
Neutro Abs: 4.5 10*3/uL (ref 1.7–7.7)
Neutrophils Relative %: 83 %
Platelets: 214 10*3/uL (ref 150–400)
RBC: 3.7 MIL/uL — ABNORMAL LOW (ref 4.22–5.81)
RDW: 14.3 % (ref 11.5–15.5)
WBC: 5.5 10*3/uL (ref 4.0–10.5)
nRBC: 0 % (ref 0.0–0.2)

## 2020-09-16 LAB — TROPONIN I (HIGH SENSITIVITY): Troponin I (High Sensitivity): 7 ng/L (ref <18)

## 2020-09-16 LAB — ETHANOL: Alcohol, Ethyl (B): <10 mg/dL (ref <10)

## 2020-09-16 MED ORDER — ALUM & MAG HYDROXIDE-SIMETH 200-200-20 MG/5ML PO SUSP
30.0000 mL | Freq: Once | ORAL | Status: AC
  Administered 2020-09-16: 30 mL via ORAL
  Filled 2020-09-16: qty 30

## 2020-09-16 MED ORDER — PANTOPRAZOLE SODIUM 40 MG IV SOLR
40.0000 mg | Freq: Once | INTRAVENOUS | Status: AC
  Administered 2020-09-16: 40 mg via INTRAVENOUS
  Filled 2020-09-16: qty 40

## 2020-09-16 NOTE — ED Provider Notes (Signed)
[facility] EMERGENCY DEPARTMENT Provider Note  CSN: [id number] Arrival date & time: 09/16/20 2137    History Chief Complaint  Patient presents with  . Chest Pain    HPI  Robert [name] is a 40 y.o. male with history of EtOH use and gastritis presents to the ED for evaluation of chest pain. Patient reports sudden onset of right sided chest pain that sometimes radiates to left side and back. Also sometimes associated with SOB and nausea. He was seen in Cardiology office and in the ED for same 56 days ago with neg workup. He continues to drink alcohol but did not take any more NSAIDs since last ED visit.    Past Medical History:  Diagnosis Date  . Alcohol use   . 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 Asthma   . CHF (congestive heart failure) ([place])     Past Surgical History:  Procedure Laterality Date  . ESOPHAGOGASTRODUODENOSCOPY (EGD) WITH PROPOFOL N/A 11/01/2019   Procedure: ESOPHAGOGASTRODUODENOSCOPY (EGD) WITH PROPOFOL;  Surgeon: [name], MD;  Location: [facility] ENDOSCOPY;  Service: Endoscopy;  Laterality: N/A;  . RIGHT/LEFT HEART CATH AND CORONARY ANGIOGRAPHY N/A 10/06/2019   Procedure: RIGHT/LEFT HEART CATH AND CORONARY ANGIOGRAPHY;  Surgeon: [name], MD;  Location: [facility] CV LAB;  Service: Cardiovascular;  Laterality: N/A;  . WRIST SURGERY      Family History  Problem Relation Age of Onset  . Heart failure Father   . Diabetes Father   . Kidney disease Father   . Heart disease Father 62  . Heart disease Maternal Grandfather 7  . Heart disease Paternal Grandfather 10    Social History   Tobacco Use  . Smoking status: Current Every Day Smoker    Packs/day: 0.25    Years: 24.00    Pack years: 6.00    Types: Cigarettes  . Smokeless tobacco: Never Used  Vaping Use  . Vaping Use: Former  Substance Use Topics  . Alcohol use: Yes    Alcohol/week: 8.0 standard drinks    Types: 8 Shots of liquor per week    Comment: heavy use since age 34  .  Drug use: Yes    Frequency: 1.0 times per week    Types: Marijuana     Home Medications Prior to Admission medications   Medication Sig Start Date End Date Taking? Authorizing Provider  carvedilol (COREG) 3.125 MG tablet Take 1 tablet (3.125 mg total) by mouth 2 (two) times daily. 04/11/20 04/11/21  Clegg, Amy D, NP  clindamycin (CLEOCIN) 300 MG capsule Take 300 mg by mouth every 6 (six) hours. 09/07/20   [provider]  digoxin (LANOXIN) 0.125 MG tablet Take 1 tablet (0.125 mg total) by mouth daily. 06/14/20   Simmons, Brittainy M, PA-C  FARXIGA 10 MG TABS tablet TAKE 1 TABLET BY MOUTH ONCE DAILY BEFORE BREAKFAST 09/07/20   Bensimhon, Shaune Pascal, MD  folic acid (FOLVITE) 1 MG tablet Take 1 tablet (1 mg total) by mouth daily. 03/16/20   Bensimhon, Shaune Pascal, MD  ibuprofen (ADVIL) 400 MG tablet 400 mg. 09/07/20   [provider]  ivabradine (CORLANOR) 7.5 MG TABS tablet Take 1 tablet (7.5 mg total) by mouth 2 (two) times daily with a meal. 03/27/20   Bensimhon, Shaune Pascal, MD  LORazepam (ATIVAN) 1 MG tablet Take 1 tablet (1 mg total) by mouth every 6 (six) hours as needed for anxiety. 08/20/20   Arrien, Jimmy Picket, MD  ondansetron (ZOFRAN ODT) 4 MG disintegrating  tablet Take 1 tablet (4 mg total) by mouth every 8 (eight) hours as needed for nausea or vomiting. 08/20/20   Arrien, Jimmy Picket, MD  pantoprazole (PROTONIX) 40 MG tablet Take 1 tablet (40 mg total) by mouth daily. 08/20/20 09/19/20  Arrien, Jimmy Picket, MD  potassium chloride SA (KLOR-CON) 20 MEQ tablet Take 20 mEq by mouth daily.    [provider]  QUEtiapine (SEROQUEL) 100 MG tablet Take 1.5 tablets (150 mg total) by mouth daily. 06/23/20   Nche, Charlene Brooke, NP  sacubitril-valsartan (ENTRESTO) 97-103 MG Take 1 tablet by mouth 2 (two) times daily. 06/16/20   Lyda Jester M, PA-C  sertraline (ZOLOFT) 25 MG tablet Take 25 mg by mouth daily as needed (anxiety).    [provider]   spironolactone (ALDACTONE) 25 MG tablet Take 1 tablet (25 mg total) by mouth daily. HOLD until 03/26/20 03/24/20   Desiree Hane, MD  sucralfate (CARAFATE) 1 g tablet Take 1 tablet (1 g total) by mouth 4 (four) times daily -  with meals and at bedtime. 07/31/20   Bonnielee Haff, MD  thiamine 100 MG tablet Take 1 tablet (100 mg total) by mouth daily. 07/31/20   Bonnielee Haff, MD     Allergies    Bactrim [sulfamethoxazole-trimethoprim]   Review of Systems   Review of Systems A comprehensive review of systems was completed and negative except as noted in HPI.    Physical Exam BP (!) 152/101 (BP Location: Right Arm)   Pulse 88   Temp 98.5 F (36.9 C) (Oral)   Resp 20   Ht 5\' 10"  (1.778 m)   Wt 87.1 kg   SpO2 100%   BMI 27.55 kg/m   Physical Exam Vitals and nursing note reviewed.  Constitutional:      Appearance: Normal appearance.  HENT:     Head: Normocephalic and atraumatic.     Nose: Nose normal.     Mouth/Throat:     Mouth: Mucous membranes are moist.  Eyes:     Extraocular Movements: Extraocular movements intact.     Conjunctiva/sclera: Conjunctivae normal.  Cardiovascular:     Rate and Rhythm: Normal rate.  Pulmonary:     Effort: Pulmonary effort is normal.     Breath sounds: Normal breath sounds.  Abdominal:     General: Abdomen is flat.     Palpations: Abdomen is soft.     Tenderness: There is no abdominal tenderness.  Musculoskeletal:        General: No swelling. Normal range of motion.     Cervical back: Neck supple.  Skin:    General: Skin is warm and dry.  Neurological:     General: No focal deficit present.     Mental Status: He is alert.  Psychiatric:        Mood and Affect: Mood normal.      ED Results / Procedures / Treatments   Labs (all labs ordered are listed, but only abnormal results are displayed) Labs Reviewed  CBC WITH DIFFERENTIAL/PLATELET - Abnormal; Notable for the following components:      Result Value   RBC 3.70 (*)     Hemoglobin 11.1 (*)    HCT 33.9 (*)    Lymphs Abs 0.6 (*)    All other components within normal limits  COMPREHENSIVE METABOLIC PANEL  ETHANOL  RAPID URINE DRUG SCREEN, HOSP PERFORMED  TROPONIN I (HIGH SENSITIVITY)    EKG EKG Interpretation  Date/Time:  Saturday Sep 16 2020 21:54:58 EDT  Ventricular Rate:  79 PR Interval:  177 QRS Duration: 92 QT Interval:  512 QTC Calculation: 588 R Axis:   70 Text Interpretation: Sinus rhythm LVH w/ repol abnormalities, possible ischemia Prolonged QT interval 12 Lead; [place] No significant change since last tracing Confirmed by Calvert Cantor [phone]) on 09/16/2020 10:02:00 PM   Radiology No results found.  Procedures Procedures  Medications Ordered in the ED Medications  pantoprazole (PROTONIX) injection 40 mg (40 mg Intravenous Given 09/16/20 2254)  alum & mag hydroxide-simeth (MAALOX/MYLANTA) 200-200-20 MG/5ML suspension 30 mL (30 mLs Oral Given 09/16/20 2254)     MDM Rules/Calculators/A&P MDM EKG unchanged from previous. Symptoms also similar to recent ED visit. Will check labs, including CBC, CMP, Trop, EtOH and UDS. CXR. Protonix and GI cocktail for symptom relief.  ED Course  I have reviewed the triage vital signs and the nursing notes.  Pertinent labs & imaging results that were available during my care of the patient were reviewed by me and considered in my medical decision making (see chart for details).  Clinical Course as of 09/16/20 2307  Sat Sep 16, 2020  2307 Care of the patient signed out to Dr. Sedonia Small at the change of shift.  [CS]    Clinical Course User Index [CS] Truddie Hidden, MD    Final Clinical Impression(s) / ED Diagnoses Final diagnoses:  None    Rx / DC Orders ED Discharge Orders    None       Truddie Hidden, MD 09/16/20 2307

## 2020-09-16 NOTE — ED Provider Notes (Signed)
  Provider Note MRN:  [id number]  Arrival date & time: 09/17/20    ED Course and Medical Decision Making  Assumed care from Dr. Karle Starch at shift change.  Chest pain, suspect alcoholic gastritis, awaiting troponin evaluation.  Troponin is negative x2, on my assessment exam is reassuring, nontoxic, normal vital signs, benign abdomen.  Appropriate for discharge  Procedures  Final Clinical Impressions(s) / ED Diagnoses     ICD-10-CM   1. Chest pain, unspecified type  R07.9     ED Discharge Orders    None        Discharge Instructions     You were evaluated in the Emergency Department and after careful evaluation, we did not find any emergent condition requiring admission or further testing in the hospital.  Your exam/testing today was overall reassuring.  No evidence of heart damage or heart attack.  As discussed, it is important that you take the Protonix medication daily to prevent discomfort.  You should also be taking the Carafate up to 4 times daily for pain.  Please continue to cut down or stop your drinking.  Please return to the Emergency Department if you experience any worsening of your condition.  Thank you for allowing [place] to be a part of your care.      Barth Kirks. Sedonia Small, [place] [email or web address]    Maudie Flakes, MD 09/17/20 [phone]

## 2020-09-16 NOTE — ED Triage Notes (Signed)
Patient arrives complaining of chest pain. Patient states that is started within the last few hours, unable to specify when. Patient denied shortness of breath, nausea or additional symptoms. Patient noted to be tremulous. Patient seen for similar 5/2

## 2020-09-17 LAB — RAPID URINE DRUG SCREEN, HOSP PERFORMED
Amphetamines: NOT DETECTED
Barbiturates: NOT DETECTED
Benzodiazepines: NOT DETECTED
Cocaine: NOT DETECTED
Opiates: NOT DETECTED
Tetrahydrocannabinol: POSITIVE — AB

## 2020-09-17 LAB — TROPONIN I (HIGH SENSITIVITY): Troponin I (High Sensitivity): 7 ng/L (ref <18)

## 2020-09-17 MED ORDER — LORAZEPAM 1 MG PO TABS
1.0000 mg | ORAL_TABLET | Freq: Once | ORAL | Status: AC
  Administered 2020-09-17: 1 mg via ORAL
  Filled 2020-09-17: qty 1

## 2020-09-17 MED ORDER — OXYCODONE HCL 5 MG PO TABS
5.0000 mg | ORAL_TABLET | Freq: Once | ORAL | Status: AC
  Administered 2020-09-17: 5 mg via ORAL
  Filled 2020-09-17: qty 1

## 2020-09-17 NOTE — Discharge Instructions (Addendum)
You were evaluated in the Emergency Department and after careful evaluation, we did not find any emergent condition requiring admission or further testing in the hospital.  Your exam/testing today was overall reassuring.  No evidence of heart damage or heart attack.  As discussed, it is important that you take the Protonix medication daily to prevent discomfort.  You should also be taking the Carafate up to 4 times daily for pain.  Please continue to cut down or stop your drinking.  Please return to the Emergency Department if you experience any worsening of your condition.  Thank you for allowing [place] to be a part of your care.

## 2020-09-24 NOTE — Progress Notes (Signed)
ADVANCED HF CLINIC NOTE  PCP: [name] NP Primary Cardiologist: Dr Haroldine Laws    Reason for F/u: Heart Failure    HPI: Robert [name] is a 40 y/o male with h/o heavy ETOH and cocaine use, PE,  biventricular systolic heart failure and tobacco abuse.   Admitted with acute onset biventricular HF in 5/21. EF 15%. RV down. He was drinking at least 2 beers every night and about 12-24 over the weekend. Also drinking 1/5 of liqour every other day. Cath showed normal coronaries. Elevated filling pressures with normal output. Placed on milrinone and diuresed with IV lasix. As he improved milrinone was weaned off . Transitioned to oral lasix. 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 CT showed small LLL PE. Placed on xarelto. Started on HF meds.  Discharge weight 178 pounds.   Readmitted 6/21 for mild acute blood loss related anemia. EGD showed no active bleeding but evidence of some gastritis.  He was switched to oral PPI twice daily, H&H remained stable and he did not require any transfusions.  He was instructed to continue Xarelto.   Continues to have very frequent ED visits. Admitted 10/25-10/28/21 for ETOH withdrawal symptoms and AKI. Admitted 03/23/2020 with AMS in the setting ETOH abuse. Hypotensive, AKI, and with lactic acidosis. Entresto, spiro, and lasix. All restarted prior to discharge. Returned to the ED on 03/26/20 with nausea/vomiting.   Echo 10/21 EF 30-35% mild RV HK.   Admitted 07/10/20 -07/12/20 with seizure in the setting of ETOH withdrawal.   Admitted 07/29/20-07/31/20 with n/v abdominal pain. Had AKI. GI consulted and recommended EGD. He felt better and refused EGD. Plan to continue PPI + carafate. Xarelto and zoloft stopped. Discharged earlier today from the hospital.   Evaluated in the ED 08/19/20 with AKI, 09/01/20 with ETOH withdrawl, and 09/11/20 with atypical chest pain.   Today he returns for HF follow up.Overall feeling ok. Remains SOB with inclines and brisk walking. Denies  PND/Orthopnea. Appetite ok. No fever or chills. He has not been weighing at home. Taking meds usually once a day. No alcohol over the last wek.  He has trouble filling pill box.  Says he takes the medications when he remembers. Lives with his mom. He requires assistance with transportation. His Mom has been driving him to appointments.      Cardiac studies. Echo (03/10/20) EF 30-35% RV normal. No effusion.  ECHO 10/12/2019 Pericardial effusion remains moderate in size. No tamponade physiology. ECHO 10/05/2019 EF <20%  Prominent trabeculation. Grade III DD  [facility] 10/05/2019 showed normal coronaries, elevated filling pressures with normal output.  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  SH:  Social History   Socioeconomic History  . Marital status: Single    Spouse name: Not on file  . Number of children: Not on file  . Years of education: Not on file  . Highest education level: Not on file  Occupational History  . Occupation: Ecologist  Tobacco Use  . Smoking status: Current Every Day Smoker    Packs/day: 0.25    Years: 24.00    Pack years: 6.00    Types: Cigarettes  . Smokeless tobacco: Never Used  Vaping Use  . Vaping Use: Former  Substance and Sexual Activity  . Alcohol use: Yes    Alcohol/week: 8.0 standard drinks    Types: 8 Shots of liquor  per week    Comment: heavy use since age 41  . Drug use: Yes    Frequency: 1.0 times per week    Types: Marijuana  . Sexual activity: Yes  Other Topics Concern  . Not on file  Social History Narrative  . Not on file   Social Determinants of Health   Financial Resource Strain: High Risk  . Difficulty of Paying Living Expenses: Very hard  Food Insecurity: Food Insecurity Present  . Worried About Charity fundraiser in the Last Year: Sometimes true  . Ran Out of Food in the Last Year:  Sometimes true  Transportation Needs: Not on file  Physical Activity: Not on file  Stress: Not on file  Social Connections: Not on file  Intimate Partner Violence: Not on file    FH:  Family History  Problem Relation Age of Onset  . Heart failure Father   . Diabetes Father   . Kidney disease Father   . Heart disease Father 71  . Heart disease Maternal Grandfather 34  . Heart disease Paternal Grandfather 96    Past Medical History:  Diagnosis Date  . Alcohol use   . Alcohol withdrawal ([place]) 03/06/2020  . Asthma   . CHF (congestive heart failure) (HCC)     Current Outpatient Medications  Medication Sig Dispense Refill  . carvedilol (COREG) 3.125 MG tablet Take 1 tablet (3.125 mg total) by mouth 2 (two) times daily. 60 tablet 11  . clindamycin (CLEOCIN) 300 MG capsule Take 300 mg by mouth every 6 (six) hours.    . digoxin (LANOXIN) 0.125 MG tablet Take 1 tablet (0.125 mg total) by mouth daily. 30 tablet 5  . FARXIGA 10 MG TABS tablet TAKE 1 TABLET BY MOUTH ONCE DAILY BEFORE BREAKFAST 60 tablet 1  . folic acid (FOLVITE) 1 MG tablet Take 1 tablet (1 mg total) by mouth daily. 30 tablet 3  . ivabradine (CORLANOR) 7.5 MG TABS tablet Take 1 tablet (7.5 mg total) by mouth 2 (two) times daily with a meal. 60 tablet 3  . LORazepam (ATIVAN) 1 MG tablet Take 1 tablet (1 mg total) by mouth every 6 (six) hours as needed for anxiety. 10 tablet 0  . ondansetron (ZOFRAN ODT) 4 MG disintegrating tablet Take 1 tablet (4 mg total) by mouth every 8 (eight) hours as needed for nausea or vomiting. 20 tablet 0  . pantoprazole (PROTONIX) 40 MG tablet Take 1 tablet (40 mg total) by mouth daily. 30 tablet 0  . potassium chloride SA (KLOR-CON) 20 MEQ tablet Take 20 mEq by mouth daily.    . QUEtiapine (SEROQUEL) 100 MG tablet Take 1.5 tablets (150 mg total) by mouth daily. 180 tablet 3  . sacubitril-valsartan (ENTRESTO) 97-103 MG Take 1 tablet by mouth 2 (two) times daily. 60 tablet 11  . sertraline  (ZOLOFT) 25 MG tablet Take 25 mg by mouth daily as needed (anxiety).    [name] spironolactone (ALDACTONE) 25 MG tablet Take 1 tablet (25 mg total) by mouth daily. HOLD until 03/26/20 90 tablet 3  . sucralfate (CARAFATE) 1 g tablet Take 1 tablet (1 g total) by mouth 4 (four) times daily -  with meals and at bedtime. 120 tablet 0  . thiamine 100 MG tablet Take 1 tablet (100 mg total) by mouth daily. 30 tablet 0   No current facility-administered medications for this encounter.   Wt Readings from Last 3 Encounters:  09/25/20 85.5 kg (188 lb 6.4 oz)  09/16/20 87.1 kg (  192 lb)  09/13/20 86.8 kg (191 lb 6.4 oz)    Vitals:   09/25/20 1207  BP: 120/80  Pulse: 84  SpO2: 99%  Weight: 85.5 kg (188 lb 6.4 oz)     PHYSICAL EXAM: General:  No resp difficulty HEENT: normal Neck: supple. no JVD. Carotids 2+ bilat; no bruits. No lymphadenopathy or thryomegaly appreciated. Cor: PMI nondisplaced. Regular rate & rhythm. No rubs, gallops or murmurs. Lungs: clear Abdomen: soft, nontender, nondistended. No hepatosplenomegaly. No bruits or masses. Good bowel sounds. Extremities: no cyanosis, clubbing, rash, edema Neuro: alert & orientedx3, cranial nerves grossly intact. moves all 4 extremities w/o difficulty. Affect pleasant  ASSESSMENT & PLAN: 1.Chronic Systolic Heart Failure, NICM  - ECHO 5/21 w/ severely reduced EF 15% RV moderately reduced. Suspect ETOH and cocaine playing a role.  - LHC 5/21 normal cors. 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  - Echo 03/10/20 ---> EF 30-35% RV normal. No effusion.  - ECHo 06/2020 --ejection fraction 30%  - NYHA III. Volume status stable.  - Continue Entresto  97-103 mg bid  - Continue Coreg 3.125 mg bid  - Continue spiro 25 mg daily - Continue digoxin 0.125 mg daily, dig level < 0.2 on 09/02/20 - Continue Corlanor 7.5 mg bid.  - Continue Farxiga 10 mg daily  - Refer for genetic testing for  LVNC  - Not a candidate for ICD or advanced therapies w/ polysubstance - Compliance with medication remain an issue.  2. Syncope 07/10/20 - Denies related to ETOH intoxication -  no driving x 6 months per [place] law. He is aware.   3.Pulmonary Emboli - CTA w/ small LLL PE 5/21 - LE Venous dopplers negative for DVT  - Given small PE. Risk of ongoing AC likely outweighs benefit - Off xarelto.   4. ETOH Abuse - Heavy drinker for many years since the age of 23.  -01/2020 had 28 day detox [facility] but started drinking again.  - Hasn't had in alcohol for the last 7 days. Congratulated.  - Asked if he is interested in [place]. He has the information and will consider.    5. H/O Cocaine Abuse - most recent UDS 10/31/19 negative  - No recent use.   6. Pericardial Effusion - moderate circumferential effusion on cMRI. - resolved on echo 02/2020   7. Tobacco Abuse - Smoking on occasion.   8. H/o GIB/ Gastritis w/ ABLA - h/o heavy ETOH abuse + chronic anticoagulation therapy w/ Xarelto - Off xarelto.  - Refused EGD 07/28/20  Check BMET today.   Difficult to manage with ongoing addiction issues. Hard to know what he is taking and how often.  HF SW asked to follow up for substance abuse.  Next visit follow up with pharmacy and will need pill box set up. Asked to bring all medications.   Follow up with APP in 4-5 weeks.    Darrick Grinder, NP  12:22 PM

## 2020-09-25 ENCOUNTER — Encounter (HOSPITAL_COMMUNITY): Payer: Self-pay

## 2020-09-25 ENCOUNTER — Ambulatory Visit (HOSPITAL_COMMUNITY)
Admission: RE | Admit: 2020-09-25 | Discharge: 2020-09-25 | Disposition: A | Discharge status: Home / Self Care | Payer: BC Managed Care – PPO | Source: Ambulatory Visit | Attending: Adult Health | Admitting: Adult Health

## 2020-09-25 ENCOUNTER — Other Ambulatory Visit: Payer: Self-pay

## 2020-09-25 VITALS — BP 120/80 | HR 84 | Wt 188.4 lb

## 2020-09-25 DIAGNOSIS — I313 Pericardial effusion (noninflammatory): Secondary | ICD-10-CM | POA: Insufficient documentation

## 2020-09-25 DIAGNOSIS — F10129 Alcohol abuse with intoxication, unspecified: Secondary | ICD-10-CM | POA: Insufficient documentation

## 2020-09-25 DIAGNOSIS — Z8719 Personal history of other diseases of the digestive system: Secondary | ICD-10-CM | POA: Insufficient documentation

## 2020-09-25 DIAGNOSIS — Z79899 Other long term (current) drug therapy: Secondary | ICD-10-CM | POA: Diagnosis not present

## 2020-09-25 DIAGNOSIS — Z7901 Long term (current) use of anticoagulants: Secondary | ICD-10-CM | POA: Insufficient documentation

## 2020-09-25 DIAGNOSIS — F17210 Nicotine dependence, cigarettes, uncomplicated: Secondary | ICD-10-CM | POA: Insufficient documentation

## 2020-09-25 DIAGNOSIS — Z720 Tobacco use: Secondary | ICD-10-CM | POA: Diagnosis not present

## 2020-09-25 DIAGNOSIS — F1410 Cocaine abuse, uncomplicated: Secondary | ICD-10-CM | POA: Diagnosis not present

## 2020-09-25 DIAGNOSIS — I5022 Chronic systolic (congestive) heart failure: Secondary | ICD-10-CM | POA: Insufficient documentation

## 2020-09-25 DIAGNOSIS — Z8249 Family history of ischemic heart disease and other diseases of the circulatory system: Secondary | ICD-10-CM | POA: Insufficient documentation

## 2020-09-25 DIAGNOSIS — R0602 Shortness of breath: Secondary | ICD-10-CM | POA: Diagnosis present

## 2020-09-25 DIAGNOSIS — I428 Other cardiomyopathies: Secondary | ICD-10-CM | POA: Diagnosis not present

## 2020-09-25 DIAGNOSIS — I2699 Other pulmonary embolism without acute cor pulmonale: Secondary | ICD-10-CM | POA: Insufficient documentation

## 2020-09-25 DIAGNOSIS — F1411 Cocaine abuse, in remission: Secondary | ICD-10-CM | POA: Diagnosis not present

## 2020-09-25 DIAGNOSIS — F1010 Alcohol abuse, uncomplicated: Secondary | ICD-10-CM

## 2020-09-25 LAB — BASIC METABOLIC PANEL
Anion gap: 9 (ref 5–15)
BUN: 18 mg/dL (ref 6–20)
CO2: 27 mmol/L (ref 22–32)
Calcium: 9.2 mg/dL (ref 8.9–10.3)
Chloride: 101 mmol/L (ref 98–111)
Creatinine, Ser: 1.7 mg/dL — ABNORMAL HIGH (ref 0.61–1.24)
GFR, Estimated: 52 mL/min — ABNORMAL LOW (ref >60)
Glucose, Bld: 117 mg/dL — ABNORMAL HIGH (ref 70–99)
Potassium: 4 mmol/L (ref 3.5–5.1)
Sodium: 137 mmol/L (ref 135–145)

## 2020-09-25 NOTE — Patient Instructions (Signed)
Keep follow up as scheduled with pharmacy team -be sure to bring all medication bottles to this appointment  Labs today We will only contact you if something comes back abnormal or we need to make some changes. Otherwise no news is good news!  Your physician recommends that you schedule a follow-up appointment in: 4-5 weeks  in the Advanced Practitioners (PA/NP) [facility], you and your health needs are our priority. As part of our continuing mission to provide you with exceptional heart care, we have created designated Provider Care Teams. These Care Teams include your primary Cardiologist (physician) and Advanced Practice Providers (APPs- Physician Assistants and Nurse Practitioners) who all work together to provide you with the care you need, when you need it.   You may see any of the following providers on your designated Care Team at your next follow up: [name] Dr Glori Bickers . Dr Loralie Champagne . Dr Vickki Muff . Darrick Grinder, NP . Lyda Jester, [place] . Audry Riles, PharmD   Please be sure to bring in all your medications bottles to every appointment.   If you have any questions or concerns before your next appointment please send [place] a message through [place] or call our office at [phone].    TO LEAVE A MESSAGE FOR THE NURSE SELECT OPTION 2, PLEASE LEAVE A MESSAGE INCLUDING: . YOUR NAME . DATE OF BIRTH . CALL BACK NUMBER . REASON FOR CALL**this is important as we prioritize the call backs  [place] AS LONG AS YOU CALL BEFORE 4:00 PM If you have any questions or concerns before your next appointment please send [place] a message through [place] or call our office at [phone].    TO LEAVE A MESSAGE FOR THE NURSE SELECT OPTION 2, PLEASE LEAVE A MESSAGE INCLUDING: . YOUR NAME . DATE OF BIRTH . CALL BACK NUMBER . REASON FOR CALL**this is important as we prioritize the call  backs  YOU WILL RECEIVE A CALL BACK THE SAME DAY AS LONG AS YOU CALL BEFORE 4:00 PM

## 2020-10-01 ENCOUNTER — Encounter (HOSPITAL_COMMUNITY): Payer: Self-pay | Admitting: Emergency Medicine

## 2020-10-01 ENCOUNTER — Emergency Department (HOSPITAL_COMMUNITY): Payer: BC Managed Care – PPO

## 2020-10-01 ENCOUNTER — Emergency Department (HOSPITAL_COMMUNITY)
Admission: EM | Admit: 2020-10-01 | Discharge: 2020-10-01 | Disposition: A | Discharge status: Home / Self Care | Payer: BC Managed Care – PPO | Attending: Emergency Medicine | Admitting: Emergency Medicine

## 2020-10-01 ENCOUNTER — Other Ambulatory Visit: Payer: Self-pay

## 2020-10-01 DIAGNOSIS — I5022 Chronic systolic (congestive) heart failure: Secondary | ICD-10-CM | POA: Diagnosis not present

## 2020-10-01 DIAGNOSIS — R251 Tremor, unspecified: Secondary | ICD-10-CM | POA: Insufficient documentation

## 2020-10-01 DIAGNOSIS — F17210 Nicotine dependence, cigarettes, uncomplicated: Secondary | ICD-10-CM | POA: Insufficient documentation

## 2020-10-01 DIAGNOSIS — R0682 Tachypnea, not elsewhere classified: Secondary | ICD-10-CM | POA: Diagnosis not present

## 2020-10-01 DIAGNOSIS — R0602 Shortness of breath: Secondary | ICD-10-CM | POA: Diagnosis not present

## 2020-10-01 DIAGNOSIS — J45909 Unspecified asthma, uncomplicated: Secondary | ICD-10-CM | POA: Diagnosis not present

## 2020-10-01 DIAGNOSIS — F10930 Alcohol use, unspecified with withdrawal, uncomplicated: Secondary | ICD-10-CM

## 2020-10-01 DIAGNOSIS — F10230 Alcohol dependence with withdrawal, uncomplicated: Secondary | ICD-10-CM

## 2020-10-01 LAB — COMPREHENSIVE METABOLIC PANEL
ALT: 75 U/L — ABNORMAL HIGH (ref 0–44)
AST: 63 U/L — ABNORMAL HIGH (ref 15–41)
Albumin: 4.7 g/dL (ref 3.5–5.0)
Alkaline Phosphatase: 53 U/L (ref 38–126)
Anion gap: 22 — ABNORMAL HIGH (ref 5–15)
BUN: 13 mg/dL (ref 6–20)
CO2: 19 mmol/L — ABNORMAL LOW (ref 22–32)
Calcium: 9.7 mg/dL (ref 8.9–10.3)
Chloride: 96 mmol/L — ABNORMAL LOW (ref 98–111)
Creatinine, Ser: 1.4 mg/dL — ABNORMAL HIGH (ref 0.61–1.24)
GFR, Estimated: >60 mL/min (ref >60)
Glucose, Bld: 150 mg/dL — ABNORMAL HIGH (ref 70–99)
Potassium: 4.2 mmol/L (ref 3.5–5.1)
Sodium: 137 mmol/L (ref 135–145)
Total Bilirubin: 1.3 mg/dL — ABNORMAL HIGH (ref 0.3–1.2)
Total Protein: 8.2 g/dL — ABNORMAL HIGH (ref 6.5–8.1)

## 2020-10-01 LAB — DIGOXIN LEVEL: Digoxin Level: 0.3 ng/mL — ABNORMAL LOW (ref 0.8–2.0)

## 2020-10-01 LAB — CBC WITH DIFFERENTIAL/PLATELET
Abs Immature Granulocytes: 0.01 10*3/uL (ref 0.00–0.07)
Basophils Absolute: 0.1 10*3/uL (ref 0.0–0.1)
Basophils Relative: 1 %
Eosinophils Absolute: 0 10*3/uL (ref 0.0–0.5)
Eosinophils Relative: 1 %
HCT: 41.7 % (ref 39.0–52.0)
Hemoglobin: 13.8 g/dL (ref 13.0–17.0)
Immature Granulocytes: 0 %
Lymphocytes Relative: 22 %
Lymphs Abs: 1 10*3/uL (ref 0.7–4.0)
MCH: 29.6 pg (ref 26.0–34.0)
MCHC: 33.1 g/dL (ref 30.0–36.0)
MCV: 89.3 fL (ref 80.0–100.0)
Monocytes Absolute: 0.4 10*3/uL (ref 0.1–1.0)
Monocytes Relative: 10 %
Neutro Abs: 3 10*3/uL (ref 1.7–7.7)
Neutrophils Relative %: 66 %
Platelets: 411 10*3/uL — ABNORMAL HIGH (ref 150–400)
RBC: 4.67 MIL/uL (ref 4.22–5.81)
RDW: 13.6 % (ref 11.5–15.5)
WBC: 4.5 10*3/uL (ref 4.0–10.5)
nRBC: 0 % (ref 0.0–0.2)

## 2020-10-01 LAB — LIPASE, BLOOD: Lipase: 24 U/L (ref 11–51)

## 2020-10-01 LAB — BRAIN NATRIURETIC PEPTIDE: B Natriuretic Peptide: 107.8 pg/mL — ABNORMAL HIGH (ref 0.0–100.0)

## 2020-10-01 LAB — TROPONIN I (HIGH SENSITIVITY)
Troponin I (High Sensitivity): 12 ng/L (ref <18)
Troponin I (High Sensitivity): 12 ng/L (ref <18)

## 2020-10-01 MED ORDER — ALUM & MAG HYDROXIDE-SIMETH 200-200-20 MG/5ML PO SUSP
30.0000 mL | Freq: Once | ORAL | Status: AC
  Administered 2020-10-01: 30 mL via ORAL
  Filled 2020-10-01: qty 30

## 2020-10-01 MED ORDER — SODIUM CHLORIDE 0.9 % IV BOLUS
1000.0000 mL | Freq: Once | INTRAVENOUS | Status: AC
  Administered 2020-10-01: 1000 mL via INTRAVENOUS

## 2020-10-01 MED ORDER — LORAZEPAM 2 MG/ML IJ SOLN
1.0000 mg | Freq: Once | INTRAMUSCULAR | Status: AC
  Administered 2020-10-01: 1 mg via INTRAVENOUS
  Filled 2020-10-01: qty 1

## 2020-10-01 MED ORDER — FAMOTIDINE IN NACL 20-0.9 MG/50ML-% IV SOLN
20.0000 mg | Freq: Once | INTRAVENOUS | Status: AC
  Administered 2020-10-01: 20 mg via INTRAVENOUS
  Filled 2020-10-01: qty 50

## 2020-10-01 NOTE — ED Triage Notes (Signed)
Pt here from home. Complaint of shortness of breath. Pt states he has CHF , dr advised him to stop taking diuretic a few months ago.

## 2020-10-01 NOTE — Discharge Instructions (Addendum)
Continue Entresto  97-103 mg bid  - Continue Coreg 3.125 mg bid  - Continue spiro 25 mg daily - Continue digoxin 0.125 mg daily, dig level < 0.2 on 09/02/20 - Continue Corlanor 7.5 mg bid.  - Continue Farxiga 10 mg daily

## 2020-10-01 NOTE — ED Provider Notes (Signed)
[facility] EMERGENCY DEPARTMENT Provider Note   CSN: [id number] Arrival date & time: 10/01/20  1827     History Chief Complaint  Patient presents with  . Shortness of Breath    Robert [name] is a 40 y.o. male.  Pt present to the ED today with sob.  The sob started earlier today.  Pt has a hx of CHF with an EF of 15%.  He seems not very educated about the problems he has and his medications that he's supposed to be on.  He has a hx of PE, but is off Xarelto due to gastritis and UGI bleeds.  He has a hx of alcohol and cocaine abuse.  He said he has not had cocaine in over a year.  He intermittently drinks heavily.  He has not had anything to drink in 2 days.  He is very shaky and has some upper abdominal pain.        Past Medical History:  Diagnosis Date  . Alcohol use   . Alcohol withdrawal ([place]) 03/06/2020  . Asthma   . CHF (congestive heart failure) [facility])     Patient Active Problem List   Diagnosis Date Noted  . Delirium tremens ([place]) 09/02/2020  . Hypomagnesemia 08/19/2020  . Seizure ([place]) 07/10/2020  . Mood disorder ([place]) 05/16/2020  . Hypotension 03/24/2020  . Polysubstance abuse ([place]) 03/24/2020  . Nausea & vomiting 03/06/2020  . AKI (acute kidney injury) ([place]) 10/30/2019  . Gastritis 10/30/2019  . Elevated INR 10/30/2019  . 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[place])   . Acute heart failure ([place]) 10/05/2019  . Pulmonary embolism ([place]) 10/04/2019  . Chronic systolic CHF (congestive heart failure) ([place])   . Inappropriate sinus tachycardia   . Chest pain 11/03/2013  . Tobacco abuse 11/03/2013    Past Surgical History:  Procedure Laterality Date  . ESOPHAGOGASTRODUODENOSCOPY (EGD) WITH PROPOFOL N/A 11/01/2019   Procedure: ESOPHAGOGASTRODUODENOSCOPY (EGD) WITH PROPOFOL;  Surgeon: [name], MD;  Location: [facility] ENDOSCOPY;  Service: Endoscopy;  Laterality: N/A;  . RIGHT/LEFT HEART CATH AND CORONARY ANGIOGRAPHY N/A  10/06/2019   Procedure: RIGHT/LEFT HEART CATH AND CORONARY ANGIOGRAPHY;  Surgeon: [name], MD;  Location: [facility] CV LAB;  Service: Cardiovascular;  Laterality: N/A;  . WRIST SURGERY         Family History  Problem Relation Age of Onset  . Heart failure Father   . Diabetes Father   . Kidney disease Father   . Heart disease Father 25  . Heart disease Maternal Grandfather 53  . Heart disease Paternal Grandfather 41    Social History   Tobacco Use  . Smoking status: Current Every Day Smoker    Packs/day: 0.25    Years: 24.00    Pack years: 6.00    Types: Cigarettes  . Smokeless tobacco: Never Used  Vaping Use  . Vaping Use: Former  Substance Use Topics  . Alcohol use: Yes    Alcohol/week: 8.0 standard drinks    Types: 8 Shots of liquor per week    Comment: heavy use since age 40  . Drug use: Yes    Frequency: 1.0 times per week    Types: Marijuana    Home Medications Prior to Admission medications   Medication Sig Start Date End Date Taking? Authorizing Provider  carvedilol (COREG) 3.125 MG tablet Take 1 tablet (3.125 mg total) by mouth 2 (two) times daily. 04/11/20 04/11/21  Conrad [place], NP  clindamycin (  CLEOCIN) 300 MG capsule Take 300 mg by mouth every 6 (six) hours. 09/07/20   [provider]  digoxin (LANOXIN) 0.125 MG tablet Take 1 tablet (0.125 mg total) by mouth daily. 06/14/20   Simmons, Brittainy M, PA-C  FARXIGA 10 MG TABS tablet TAKE 1 TABLET BY MOUTH ONCE DAILY BEFORE BREAKFAST 09/07/20   Bensimhon, Shaune Pascal, MD  folic acid (FOLVITE) 1 MG tablet Take 1 tablet (1 mg total) by mouth daily. 03/16/20   Bensimhon, Shaune Pascal, MD  ivabradine (CORLANOR) 7.5 MG TABS tablet Take 1 tablet (7.5 mg total) by mouth 2 (two) times daily with a meal. 03/27/20   Bensimhon, Shaune Pascal, MD  LORazepam (ATIVAN) 1 MG tablet Take 1 tablet (1 mg total) by mouth every 6 (six) hours as needed for anxiety. 08/20/20   Arrien, Jimmy Picket, MD  ondansetron (ZOFRAN ODT) 4 MG  disintegrating tablet Take 1 tablet (4 mg total) by mouth every 8 (eight) hours as needed for nausea or vomiting. 08/20/20   Arrien, Jimmy Picket, MD  pantoprazole (PROTONIX) 40 MG tablet Take 1 tablet (40 mg total) by mouth daily. 08/20/20 09/19/20  Arrien, Jimmy Picket, MD  potassium chloride SA (KLOR-CON) 20 MEQ tablet Take 20 mEq by mouth daily.    [provider]  QUEtiapine (SEROQUEL) 100 MG tablet Take 1.5 tablets (150 mg total) by mouth daily. 06/23/20   Nche, Charlene Brooke, NP  sacubitril-valsartan (ENTRESTO) 97-103 MG Take 1 tablet by mouth 2 (two) times daily. 06/16/20   Lyda Jester M, PA-C  sertraline (ZOLOFT) 25 MG tablet Take 25 mg by mouth daily as needed (anxiety).    [provider]  spironolactone (ALDACTONE) 25 MG tablet Take 1 tablet (25 mg total) by mouth daily. HOLD until 03/26/20 03/24/20   Desiree Hane, MD  sucralfate (CARAFATE) 1 g tablet Take 1 tablet (1 g total) by mouth 4 (four) times daily -  with meals and at bedtime. 07/31/20   Bonnielee Haff, MD  thiamine 100 MG tablet Take 1 tablet (100 mg total) by mouth daily. 07/31/20   Bonnielee Haff, MD    Allergies    Bactrim [sulfamethoxazole-trimethoprim]  Review of Systems   Review of Systems  Respiratory: Positive for shortness of breath.   Gastrointestinal: Positive for abdominal pain.  All other systems reviewed and are negative.   Physical Exam Updated Vital Signs BP (!) 133/99 (BP Location: Right Arm)   Pulse (!) 59   Temp 98 F (36.7 C) (Oral)   Resp (!) 28   SpO2 100%   Physical Exam Vitals and nursing note reviewed.  Constitutional:      Appearance: He is well-developed.  HENT:     Head: Normocephalic and atraumatic.     Mouth/Throat:     Mouth: Mucous membranes are moist.     Pharynx: Oropharynx is clear.  Eyes:     Extraocular Movements: Extraocular movements intact.     Pupils: Pupils are equal, round, and reactive to light.  Cardiovascular:     Rate and  Rhythm: Normal rate and regular rhythm.  Pulmonary:     Effort: Tachypnea present.  Abdominal:     General: Bowel sounds are normal.     Palpations: Abdomen is soft.  Musculoskeletal:        General: Normal range of motion.     Cervical back: Normal range of motion and neck supple.  Skin:    General: Skin is warm.     Capillary Refill: Capillary refill takes  less than 2 seconds.  Neurological:     General: No focal deficit present.     Mental Status: He is alert and oriented to person, place, and time.     Motor: Tremor present.  Psychiatric:        Mood and Affect: Mood is anxious.     ED Results / Procedures / Treatments   Labs (all labs ordered are listed, but only abnormal results are displayed) Labs Reviewed  CBC WITH DIFFERENTIAL/PLATELET - Abnormal; Notable for the following components:      Result Value   Platelets 411 (*)    All other components within normal limits  COMPREHENSIVE METABOLIC PANEL - Abnormal; Notable for the following components:   Chloride 96 (*)    CO2 19 (*)    Glucose, Bld 150 (*)    Creatinine, Ser 1.40 (*)    Total Protein 8.2 (*)    AST 63 (*)    ALT 75 (*)    Total Bilirubin 1.3 (*)    Anion gap 22 (*)    All other components within normal limits  BRAIN NATRIURETIC PEPTIDE - Abnormal; Notable for the following components:   B Natriuretic Peptide 107.8 (*)    All other components within normal limits  LIPASE, BLOOD  DIGOXIN LEVEL  TROPONIN I (HIGH SENSITIVITY)  TROPONIN I (HIGH SENSITIVITY)    EKG EKG Interpretation  Date/Time:  Sunday Oct 01 2020 18:50:34 EDT Ventricular Rate:  115 PR Interval:  158 QRS Duration: 78 QT Interval:  362 QTC Calculation: 500 R Axis:   58 Text Interpretation: Sinus tachycardia Right atrial enlargement ST & Marked T wave abnormality, consider anterolateral ischemia Abnormal ECG No significant change since last tracing Confirmed by Isla Pence [phone]) on 10/01/2020 7:28:09 PM   Radiology DG  Chest 2 View  Result Date: 10/01/2020 CLINICAL DATA:  Shortness of breath EXAM: CHEST - 2 VIEW COMPARISON:  09/16/2020 FINDINGS: The heart size and mediastinal contours are within normal limits. Both lungs are clear. The visualized skeletal structures are unremarkable. IMPRESSION: No active cardiopulmonary disease. Electronically Signed   By: Davina Poke D.O.   On: 10/01/2020 19:41    Procedures Procedures   Medications Ordered in ED Medications  famotidine (PEPCID) IVPB 20 mg premix (has no administration in time range)  sodium chloride 0.9 % bolus 1,000 mL (1,000 mLs Intravenous New Bag/Given 10/01/20 2016)  LORazepam (ATIVAN) injection 1 mg (1 mg Intravenous Given 10/01/20 2016)  alum & mag hydroxide-simeth (MAALOX/MYLANTA) 200-200-20 MG/5ML suspension 30 mL (30 mLs Oral Given 10/01/20 2017)    ED Course  I have reviewed the triage vital signs and the nursing notes.  Pertinent labs & imaging results that were available during my care of the patient were reviewed by me and considered in my medical decision making (see chart for details).    MDM Rules/Calculators/A&P                          CXR is clear and cardiac eval is negative.  He likely has some withdrawal and some gastritis from alcohol abuse.    Pt has just gotten his meds at shift change.  If he feels better, he will be able to go home.     Final Clinical Impression(s) / ED Diagnoses Final diagnoses:  SOB (shortness of breath)    Rx / DC Orders ED Discharge Orders    None       Isla Pence, MD 10/01/20  2048  

## 2020-10-01 NOTE — ED Provider Notes (Signed)
Care assumed from Dr. Gilford Raid at shift change pending symptomatic relief, delta troponin, and digoxin level. See her note for full HPI.  In short, patient is a 40 year old male who presents to the ED due to shortness of breath that started today. History of CHF with ED of 15%. He also has a history of PE not currently on any anticoagulants. History of alcohol and cocaine abuse. He hasn't had any alcohol in 2 days.   ED Course/Procedures   Clinical Course as of 10/01/20 2204  Sun Oct 01, 2020  2203 Digoxin, Serum(!): 0.3 [CA]    Clinical Course User Index [CA] Suzy Bouchard, PA-C   Results for orders placed or performed during the hospital encounter of 10/01/20 (from the past 24 hour(s))  CBC with Differential     Status: Abnormal   Collection Time: 10/01/20  6:56 PM  Result Value Ref Range   WBC 4.5 4.0 - 10.5 K/uL   RBC 4.67 4.22 - 5.81 MIL/uL   Hemoglobin 13.8 13.0 - 17.0 g/dL   HCT 41.7 39.0 - 52.0 %   MCV 89.3 80.0 - 100.0 fL   MCH 29.6 26.0 - 34.0 pg   MCHC 33.1 30.0 - 36.0 g/dL   RDW 13.6 11.5 - 15.5 %   Platelets 411 (H) 150 - 400 K/uL   nRBC 0.0 0.0 - 0.2 %   Neutrophils Relative % 66 %   Neutro Abs 3.0 1.7 - 7.7 K/uL   Lymphocytes Relative 22 %   Lymphs Abs 1.0 0.7 - 4.0 K/uL   Monocytes Relative 10 %   Monocytes Absolute 0.4 0.1 - 1.0 K/uL   Eosinophils Relative 1 %   Eosinophils Absolute 0.0 0.0 - 0.5 K/uL   Basophils Relative 1 %   Basophils Absolute 0.1 0.0 - 0.1 K/uL   Immature Granulocytes 0 %   Abs Immature Granulocytes 0.01 0.00 - 0.07 K/uL  Comprehensive metabolic panel     Status: Abnormal   Collection Time: 10/01/20  6:56 PM  Result Value Ref Range   Sodium 137 135 - 145 mmol/L   Potassium 4.2 3.5 - 5.1 mmol/L   Chloride 96 (L) 98 - 111 mmol/L   CO2 19 (L) 22 - 32 mmol/L   Glucose, Bld 150 (H) 70 - 99 mg/dL   BUN 13 6 - 20 mg/dL   Creatinine, Ser 1.40 (H) 0.61 - 1.24 mg/dL   Calcium 9.7 8.9 - 10.3 mg/dL   Total Protein 8.2 (H) 6.5 - 8.1  g/dL   Albumin 4.7 3.5 - 5.0 g/dL   AST 63 (H) 15 - 41 U/L   ALT 75 (H) 0 - 44 U/L   Alkaline Phosphatase 53 38 - 126 U/L   Total Bilirubin 1.3 (H) 0.3 - 1.2 mg/dL   GFR, Estimated >60 >60 mL/min   Anion gap 22 (H) 5 - 15  Lipase, blood     Status: None   Collection Time: 10/01/20  6:56 PM  Result Value Ref Range   Lipase 24 11 - 51 U/L  Troponin I (High Sensitivity)     Status: None   Collection Time: 10/01/20  6:56 PM  Result Value Ref Range   Troponin I (High Sensitivity) 12 <18 ng/L  Brain natriuretic peptide     Status: Abnormal   Collection Time: 10/01/20  6:56 PM  Result Value Ref Range   B Natriuretic Peptide 107.8 (H) 0.0 - 100.0 pg/mL  Digoxin level     Status: Abnormal  Collection Time: 10/01/20  8:50 PM  Result Value Ref Range   Digoxin Level 0.3 (L) 0.8 - 2.0 ng/mL  Troponin I (High Sensitivity)     Status: None   Collection Time: 10/01/20  8:50 PM  Result Value Ref Range   Troponin I (High Sensitivity) 12 <18 ng/L    Procedures  MDM  Assumed care from Dr. Gilford Raid at shift change pending symptomatic relief and digoxin level/ troponin.  Delta troponin flat. Doubt ACS. Digoxin level lower than normal likely due to non-compliance. Low suspicion for digoxin toxicity. Suspect tremors from alcohol withdrawal. Patient treated with ativan with improvement in symptoms. HR normalized to 90-low 100s. Tremor improved. Patient denies hallucinations. Mother is at bedside and had some concerns about patient's depression. Patient denies SI, HI, and auditory/visual hallucinations. I offered TTS consultation and patient deferred. Patient has librium taper at home and is undecided if he is going to stop alcohol or not. Advised patient not to mix librium taper with alcohol. Outpatient resources given to patient. Previous provider created a list of medications patient should be taking. Strict ED precautions discussed with patient. Patient states understanding and agrees to plan. Patient  discharged home in no acute distress and stable vitals.      Karie Kirks 10/01/20 2308    Isla Pence, MD 10/04/20 [phone]

## 2020-10-01 NOTE — Progress Notes (Incomplete)
***In Progress*** PCP: Robert Lacy NP Primary Cardiologist: Dr. Haroldine [name]    HPI:  [name] is a 40 y/o male with h/oheavyETOH and cocaine use, PE, biventricular systolic heart failure and tobacco abuse.   Admitted with acute onset biventricular HF in 09/2019. EF was 15%, RV down. He was drinking at least 2 beers every night and about 12-24 beers over the weekend. Was also drinking 1/5 of liquor every other day. Cath showed normal coronaries with elevated filling pressures and normal output. Was placed on milrinone and diuresed with IV furosemide. As he improved, milrinone was weaned off and transitioned to oral furosemide. 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 CT showed small LLL PE. Placed on Xarelto and started on HF medications. Discharge weight was 178 lbs.   Readmitted on 10/2019 for mild acute blood loss related anemia. EGD showed no active bleeding but evidence of some gastritis. He was switched to oral PPI twice daily. H&H remained stable and he did not require any transfusions. He was instructed to continue Xarelto.   Continued to have very frequent ED visits. Admitted 10/25-10/28/21 for ETOH withdrawal symptoms and AKI. Admitted 03/23/20 with AMS in the setting ETOH abuse. Hypotensive, AKI, and with lactic acidosis. Entresto, spironolactone, and furosemide all restarted prior to discharge. Returned to the ED on 03/26/20 with nausea/vomiting.   Echo on 02/2020 with EF 30-35%, mild RV HK.   Admitted 07/10/20-07/12/20 with seizure in the setting of ETOH withdrawal.   Admitted 07/29/20-07/31/20 with N/V and abdominal pain. Had AKI. GI consulted and recommended EGD. He felt better and refused EGD. Continued PPI and carafate. Xarelto and sertraline stopped.  On 08/14/20, he was discharged from HF Paramedicine team due to ongoing noncompliance with program.  Evaluated in the ED on 08/19/20 with AKI, 09/01/20 with ETOH withdrawal, and 09/11/20 with atypical chest pain.   Recently  returned to HF clinic for HF follow up with Robert Grinder, NP. Overall was feeling ok. Remained SOB with inclines and brisk walking. Denied PND/Orthopnea. Appetite was ok. No fever or chills. He had not been weighing at home. No alcohol intake the week prior.  He had trouble filling pill box. Reported taking medications when he remembered, usually once a day. Lived with his mom. He required assistance with transportation and his Mom had been driving him to appointments.    Today he returns to HF clinic for pharmacist medication titration. At last visit with NP, no medication changes were made given uncertainty of which medications he was taking and how often.   120/80, 84, 188 lbs - get another bmet/dig level if trough? 5/16 Scr 1.12>1.7 - Set up pill box - BP all over the place but runs high - Alcohol intake ? A) Incr coreg 6.25- RV normal - Needs BiDil but probs wouldn't be compliant F/u 6/20 APP  Overall feeling ***. Dizziness, lightheadedness, fatigue:  Chest pain or palpitations:  How is your breathing?: *** SOB: Able to complete all ADLs. Activity level ***  Weight at home pounds. Takes furosemide/torsemide/bumex *** mg *** daily.  LEE PND/Orthopnea  Appetite *** Low-salt diet:   Physical Exam Cost/affordability of meds   HF Medications: Carvedilol 3.125 mg BID Entresto 97/103 mg BID Spironolactone 25 mg daily Farxiga 10 mg daily Digoxin 0.125 mg daily Corlanor 7.5 mg BID KCl 20 mEq daily  Has the patient been experiencing any side effects to the medications prescribed?  {YES NO:22349}  Does the patient have any problems obtaining medications due to transportation or finances?   {  YES KN:39767} - Has Film/video editor. Robert [name] from [employer] Patient Assistance. Uses Copay card for [employer] and [place].  Understanding of regimen: {excellent/good/fair/poor:19665} Understanding of indications: {excellent/good/fair/poor:19665} Potential of compliance:  {excellent/good/fair/poor:19665} Patient understands to avoid NSAIDs. Patient understands to avoid decongestants.    Pertinent Lab Values: . 09/25/20: Serum creatinine 1.7, BUN 18, Potassium 4.0, Sodium 137, BNP ***, Magnesium ***, Digoxin ***   Vital Signs: . Weight: *** (last clinic weight: ***) . Blood pressure: ***  . Heart rate: ***   Assessment/Plan: 1.Chronic Systolic Heart Failure, NICM  - ECHO 09/2019 w/ severely reduced EF 15% RV moderately reduced. Suspect ETOH and cocaine playing a role.  - LHC 09/2019 normal cors. 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  - Echo 03/10/20 w/ EF 30-35% RV normal. No effusion.  - Echo 06/2020 w/ EF 30%  - NYHA III. Volume status stable.  - Continue carvedilol 3.125 mg BID - Continue Entresto 97/103 mg BID - Continue spironolactone 25 mg daily - Continue Farxiga 10 mg daily - Continue digoxin 0.125 mg daily. Digoxin level <0.2 on 09/02/20. 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  2. Syncope on 07/10/20 - Denies related to ETOH intoxication - No driving for 6 months per [place] law. He is aware.   3.Pulmonary Emboli - CTA w/ small LLL PE 09/2019 - LE Venous dopplers negative for DVT  - Given small PE. Risk of ongoing AC likely outweighs benefit - Off Xarelto.   4. ETOH Abuse - Heavy drinker for many years since the age of 47.  - 01/2020 had 28 day detox [facility] but started drinking again.  - Hasn't had in alcohol for the last 7 days. Congratulated.  - Asked if he is interested in [place]. He has the information and will consider.    5. H/O Cocaine Abuse - Most recent UDS 09/16/20 negative for cocaine but positive for THC - No recent use.   6. Pericardial Effusion -  Moderate circumferential effusion on cMRI. - Resolved on echo 02/2020   7. Tobacco Abuse - Smoking on occasion.   8. H/o GIB/ Gastritis w/ ABLA - H/o heavy ETOH abuse + chronic anticoagulation therapy w/ Xarelto - Off Xarelto.  - Refused EGD 07/28/20   Audry Riles, PharmD, BCPS, BCCP, CPP Heart Failure Clinic Pharmacist [phone]

## 2020-10-01 NOTE — ED Provider Notes (Signed)
Emergency Medicine Provider Triage Evaluation Note  Robert [name] , a 40 y.o. male  was evaluated in triage.  Pt complains of shortness of breath that started earlier today associated with chest pain. History of CHF and PE, not currently on any anticoagulants. Not on any diuretics. Admits to a persistent cough over the past few days. Denies fever and chills. Admits to one episodes of emesis today. Echo in 06/2020 with EF 30%. Of note, patient has a history of alcohol abuse and admits to drinking numerous airplane bottles of vodka daily; however he has not had a drink in 2 days.   Review of Systems  Positive: SOB, CP, emesis Negative: fever  Physical Exam  There were no vitals taken for this visit. Gen:   Awake, no distress   Resp:  Normal effort  MSK:   Moves extremities without difficulty  Other:    Medical Decision Making  Medically screening exam initiated at 6:45 PM.  Appropriate orders placed.  [name] was informed that the remainder of the evaluation will be completed by another provider, this initial triage assessment does not replace that evaluation, and the importance of remaining in the ED until their evaluation is complete.  Shortness of breath in setting of history of CHF and PE not currently on any diuretics or anticoagulants. Labs ordered. BNP to rule out CHF exacerbation.    Karie Kirks 10/01/20 1854    Isla Pence, MD 10/01/20 1929

## 2020-10-02 ENCOUNTER — Encounter (HOSPITAL_COMMUNITY): Payer: Self-pay

## 2020-10-02 ENCOUNTER — Ambulatory Visit (HOSPITAL_COMMUNITY)
Admission: RE | Admit: 2020-10-02 | Discharge: 2020-10-02 | Disposition: A | Discharge status: Home / Self Care | Payer: BC Managed Care – PPO | Source: Ambulatory Visit | Attending: Cardiology | Admitting: Cardiology

## 2020-10-02 VITALS — BP 130/86 | HR 105 | Wt 186.8 lb

## 2020-10-02 DIAGNOSIS — F1411 Cocaine abuse, in remission: Secondary | ICD-10-CM | POA: Insufficient documentation

## 2020-10-02 DIAGNOSIS — F1011 Alcohol abuse, in remission: Secondary | ICD-10-CM | POA: Diagnosis not present

## 2020-10-02 DIAGNOSIS — I5082 Biventricular heart failure: Secondary | ICD-10-CM | POA: Diagnosis not present

## 2020-10-02 DIAGNOSIS — Z9114 Patient's other noncompliance with medication regimen: Secondary | ICD-10-CM | POA: Diagnosis not present

## 2020-10-02 DIAGNOSIS — Z86711 Personal history of pulmonary embolism: Secondary | ICD-10-CM | POA: Insufficient documentation

## 2020-10-02 DIAGNOSIS — F17200 Nicotine dependence, unspecified, uncomplicated: Secondary | ICD-10-CM | POA: Insufficient documentation

## 2020-10-02 DIAGNOSIS — I428 Other cardiomyopathies: Secondary | ICD-10-CM | POA: Diagnosis not present

## 2020-10-02 DIAGNOSIS — I5022 Chronic systolic (congestive) heart failure: Secondary | ICD-10-CM | POA: Diagnosis not present

## 2020-10-02 DIAGNOSIS — Z79899 Other long term (current) drug therapy: Secondary | ICD-10-CM | POA: Diagnosis not present

## 2020-10-02 NOTE — Patient Instructions (Signed)
It was a pleasure seeing you today!  MEDICATIONS: -We are changing your medications today -Restart spironolactone 25 mg daily -Call if you have questions about your medications.  NEXT APPOINTMENT: Return to clinic in 1 week for labs and 1 month with APP Clinic.  In general, to take care of your heart failure: -Limit your fluid intake to 2 Liters (half-gallon) per day.   -Limit your salt intake to ideally 2-3 grams (2000-3000 mg) per day. -Weigh yourself daily and record, and bring that "weight diary" to your next appointment.  (Weight gain of 2-3 pounds in 1 day typically means fluid weight.) -The medications for your heart are to help your heart and help you live longer.   -Please contact us before stopping any of your heart medications.  Call the clinic at [phone] with questions or to reschedule future appointments.

## 2020-10-02 NOTE — Progress Notes (Signed)
PCP: [name] NP Primary Cardiologist: Dr. Haroldine Laws    HPI:  Robert [name] is a 40 y/o male with h/oheavyETOH and cocaine use, PE, biventricular systolic heart failure and tobacco abuse.   Admitted with acute onset biventricular HF in 09/2019. EF was 15%, RV down. He was drinking at least 2 beers every night and about 12-24 beers over the weekend. Was also drinking 1/5 of liquor every other day. Cath showed normal coronaries with elevated filling pressures and normal output. Was placed on milrinone and diuresed with IV furosemide. As he improved, milrinone was weaned off and transitioned to oral furosemide. 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 CT showed small LLL PE. Placed on Xarelto and started on HF medications. Discharge weight was 178 lbs.   Readmitted on 10/2019 for mild acute blood loss related anemia. EGD showed no active bleeding but evidence of some gastritis. He was switched to oral PPI twice daily. H&H remained stable and he did not require any transfusions. He was instructed to continue Xarelto.   Continued to have very frequent ED visits. Admitted 10/25-10/28/21 for ETOH withdrawal symptoms and AKI. Admitted 03/23/20 with AMS in the setting ETOH abuse. Hypotensive, AKI, and with lactic acidosis. Entresto, spironolactone, and furosemide all restarted prior to discharge. Returned to the ED on 03/26/20 with nausea/vomiting.   Echo on 02/2020 with EF 30-35%, mild RV HK.   Admitted 07/10/20-07/12/20 with seizure in the setting of ETOH withdrawal.   Admitted 07/29/20-07/31/20 with N/V and abdominal pain. Had AKI. GI consulted and recommended EGD. He felt better and refused EGD. Continued PPI and carafate. Xarelto and sertraline stopped.  On 08/14/20, he was discharged from HF Paramedicine team due to ongoing noncompliance with program.  Evaluated in the ED on 08/19/20 with AKI, 09/01/20 with ETOH withdrawal, and 09/11/20 with atypical chest pain.   Recently returned to HF clinic  for HF follow up with Darrick Grinder, NP. Overall was feeling ok. Remained SOB with inclines and brisk walking. Denied PND/Orthopnea. Appetite was ok. No fever or chills. He had not been weighing at home. No alcohol intake the week prior.  He had trouble filling pill box. Reported taking medications when he remembered, usually once a day. Lived with his mom. He required assistance with transportation and his Mom had been driving him to appointments.    Today he returns to HF clinic for pharmacist medication titration. At last visit with NP, no medication changes were made given uncertainty of which medications he was taking and how often. He was also evaluated in ED last night for SOB and alcohol withdrawal symptoms and was discharged after a few hours in no acute distress. Today he reports feeling a little better than last visit. He brought his pill boxes into office today but has not used them for months and is unsure which medications are in there. He currently takes medications out of the pill bottles when he remembers to take them. He reports that he is taking all of his cardiac medications as prescribed, however he is not taking spironolactone or potassium supplements because he thought he was supposed to stop taking it. He did not take his medications this morning.   He reports his fatigue has gotten worse but he does not have dizziness or lightheadedness. His breathing is ok but he does not walk or do any physical activity. No LEE, orthopnea, or PND. His appetite has decreased significantly recently and states that he has not eaten in the past 2 days. He will  try to eat something tonight and try meal supplement drinks.   HF Medications: Carvedilol 3.125 mg BID Entresto 97/103 mg BID Farxiga 10 mg daily Digoxin 0.125 mg daily Corlanor 7.5 mg BID  Has the patient been experiencing any side effects to the medications prescribed?  no  Does the patient have any problems obtaining medications due to  transportation or finances?   no - Has Film/video editor. Maryan Puls from [employer] Patient Assistance. Uses Copay card for [employer] and [place].  Understanding of regimen: poor Understanding of indications: poor Potential of compliance: poor Patient understands to avoid NSAIDs. Patient understands to avoid decongestants.    Pertinent Lab Values: . 10/01/20: Serum creatinine 1.4, BUN 13, Potassium 4.2, Sodium 137, BNP 107.8, Digoxin 0.3  Vital Signs: . Weight: 186.8 lbs (last clinic weight: 188) . Blood pressure: 130/86  . Heart rate: 105   Assessment/Plan: 1.Chronic Systolic Heart Failure, NICM  - ECHO 09/2019 w/ severely reduced EF 15% RV moderately reduced. Suspect ETOH and cocaine playing a role.  - LHC 09/2019 normal cors. 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  - Echo 03/10/20 w/ EF 30-35% RV normal. No effusion.  - Echo 06/2020 w/ EF 30%  - NYHA III. Volume status stable.  - Continue carvedilol 3.125 mg BID. Patient did not take medications this morning. Will hold off on increasing dose today given significant fatigue. - Continue Entresto 97/103 mg BID - Restart spironolactone 25 mg daily. Check BMET in 1 week. - Continue Farxiga 10 mg daily - Continue digoxin 0.125 mg daily - Continue Corlanor 7.5 mg BID. Provided samples today to fill pill box. 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 Helped fill pill boxes for the next 2 weeks. Re-educated on the importance of medication compliance. - Follow up office visit with APP Clinic on 10/30/20  2. Syncope on 07/10/20 - Denies related to ETOH intoxication - No driving for 6 months per [place] law. He is aware.   3.Pulmonary Emboli - CTA w/ small LLL PE 09/2019 - LE Venous dopplers negative for DVT  - Given small  PE. Risk of ongoing AC likely outweighs benefit - Off Xarelto.   4. ETOH Abuse - Heavy drinker for many years since the age of 55.  - 01/2020 had 28 day detox [facility] but started drinking again.  - Hasn't had in alcohol for the last 3 days. Congratulated.   5. H/O Cocaine Abuse - Most recent UDS 09/16/20 negative for cocaine but positive for THC - No recent use.   6. Pericardial Effusion - Moderate circumferential effusion on cMRI. - Resolved on echo 02/2020   7. Tobacco Abuse - Smoking on occasion.   8. H/o GIB/ Gastritis w/ ABLA - H/o heavy ETOH abuse + chronic anticoagulation therapy w/ Xarelto - Off Xarelto.  - Refused EGD 07/28/20  Richardine Service, PharmD, [place] PGY2 Cardiology Pharmacy Resident  Kerby Nora, PharmD, BCPS Heart Failure Clinic Pharmacist [phone]

## 2020-10-08 ENCOUNTER — Emergency Department (HOSPITAL_COMMUNITY): Payer: BC Managed Care – PPO

## 2020-10-08 ENCOUNTER — Inpatient Hospital Stay (HOSPITAL_COMMUNITY)
Admission: EM | Admit: 2020-10-08 | Discharge: 2020-10-13 | DRG: 871 | Disposition: A | Discharge status: Home / Self Care | Payer: BC Managed Care – PPO | Attending: Internal Medicine | Admitting: Internal Medicine

## 2020-10-08 ENCOUNTER — Other Ambulatory Visit: Payer: Self-pay

## 2020-10-08 ENCOUNTER — Encounter (HOSPITAL_COMMUNITY): Payer: Self-pay

## 2020-10-08 DIAGNOSIS — E876 Hypokalemia: Secondary | ICD-10-CM | POA: Diagnosis present

## 2020-10-08 DIAGNOSIS — K2970 Gastritis, unspecified, without bleeding: Secondary | ICD-10-CM | POA: Diagnosis present

## 2020-10-08 DIAGNOSIS — Y906 Blood alcohol level of 120-199 mg/100 ml: Secondary | ICD-10-CM | POA: Diagnosis present

## 2020-10-08 DIAGNOSIS — J690 Pneumonitis due to inhalation of food and vomit: Secondary | ICD-10-CM | POA: Diagnosis present

## 2020-10-08 DIAGNOSIS — J45909 Unspecified asthma, uncomplicated: Secondary | ICD-10-CM | POA: Diagnosis present

## 2020-10-08 DIAGNOSIS — K760 Fatty (change of) liver, not elsewhere classified: Secondary | ICD-10-CM | POA: Diagnosis present

## 2020-10-08 DIAGNOSIS — F1410 Cocaine abuse, uncomplicated: Secondary | ICD-10-CM | POA: Diagnosis present

## 2020-10-08 DIAGNOSIS — A419 Sepsis, unspecified organism: Principal | ICD-10-CM

## 2020-10-08 DIAGNOSIS — R7989 Other specified abnormal findings of blood chemistry: Secondary | ICD-10-CM | POA: Diagnosis present

## 2020-10-08 DIAGNOSIS — Z8249 Family history of ischemic heart disease and other diseases of the circulatory system: Secondary | ICD-10-CM | POA: Diagnosis not present

## 2020-10-08 DIAGNOSIS — E872 Acidosis, unspecified: Secondary | ICD-10-CM

## 2020-10-08 DIAGNOSIS — F39 Unspecified mood [affective] disorder: Secondary | ICD-10-CM | POA: Diagnosis present

## 2020-10-08 DIAGNOSIS — R6520 Severe sepsis without septic shock: Secondary | ICD-10-CM | POA: Diagnosis present

## 2020-10-08 DIAGNOSIS — F10229 Alcohol dependence with intoxication, unspecified: Secondary | ICD-10-CM | POA: Diagnosis present

## 2020-10-08 DIAGNOSIS — R112 Nausea with vomiting, unspecified: Secondary | ICD-10-CM | POA: Diagnosis present

## 2020-10-08 DIAGNOSIS — K029 Dental caries, unspecified: Secondary | ICD-10-CM | POA: Diagnosis present

## 2020-10-08 DIAGNOSIS — Z79899 Other long term (current) drug therapy: Secondary | ICD-10-CM | POA: Diagnosis not present

## 2020-10-08 DIAGNOSIS — F17210 Nicotine dependence, cigarettes, uncomplicated: Secondary | ICD-10-CM | POA: Diagnosis present

## 2020-10-08 DIAGNOSIS — R9431 Abnormal electrocardiogram [ECG] [EKG]: Secondary | ICD-10-CM | POA: Diagnosis present

## 2020-10-08 DIAGNOSIS — Z20822 Contact with and (suspected) exposure to covid-19: Secondary | ICD-10-CM | POA: Diagnosis present

## 2020-10-08 DIAGNOSIS — J189 Pneumonia, unspecified organism: Secondary | ICD-10-CM | POA: Diagnosis not present

## 2020-10-08 DIAGNOSIS — I272 Pulmonary hypertension, unspecified: Secondary | ICD-10-CM | POA: Diagnosis present

## 2020-10-08 DIAGNOSIS — J984 Other disorders of lung: Secondary | ICD-10-CM | POA: Diagnosis present

## 2020-10-08 DIAGNOSIS — E860 Dehydration: Secondary | ICD-10-CM | POA: Diagnosis present

## 2020-10-08 DIAGNOSIS — R059 Cough, unspecified: Secondary | ICD-10-CM | POA: Diagnosis present

## 2020-10-08 DIAGNOSIS — R471 Dysarthria and anarthria: Secondary | ICD-10-CM | POA: Diagnosis present

## 2020-10-08 DIAGNOSIS — I428 Other cardiomyopathies: Secondary | ICD-10-CM | POA: Diagnosis present

## 2020-10-08 DIAGNOSIS — Z86711 Personal history of pulmonary embolism: Secondary | ICD-10-CM

## 2020-10-08 DIAGNOSIS — F10231 Alcohol dependence with withdrawal delirium: Secondary | ICD-10-CM | POA: Diagnosis present

## 2020-10-08 DIAGNOSIS — I5022 Chronic systolic (congestive) heart failure: Secondary | ICD-10-CM | POA: Diagnosis present

## 2020-10-08 DIAGNOSIS — R197 Diarrhea, unspecified: Secondary | ICD-10-CM | POA: Diagnosis present

## 2020-10-08 DIAGNOSIS — F10929 Alcohol use, unspecified with intoxication, unspecified: Secondary | ICD-10-CM

## 2020-10-08 DIAGNOSIS — N179 Acute kidney failure, unspecified: Secondary | ICD-10-CM | POA: Diagnosis present

## 2020-10-08 LAB — RAPID URINE DRUG SCREEN, HOSP PERFORMED
Amphetamines: NOT DETECTED
Barbiturates: NOT DETECTED
Benzodiazepines: NOT DETECTED
Cocaine: NOT DETECTED
Opiates: NOT DETECTED
Tetrahydrocannabinol: POSITIVE — AB

## 2020-10-08 LAB — COMPREHENSIVE METABOLIC PANEL
ALT: 74 U/L — ABNORMAL HIGH (ref 0–44)
AST: 99 U/L — ABNORMAL HIGH (ref 15–41)
Albumin: 4.9 g/dL (ref 3.5–5.0)
Alkaline Phosphatase: 55 U/L (ref 38–126)
Anion gap: 24 — ABNORMAL HIGH (ref 5–15)
BUN: 14 mg/dL (ref 6–20)
CO2: 18 mmol/L — ABNORMAL LOW (ref 22–32)
Calcium: 9.3 mg/dL (ref 8.9–10.3)
Chloride: 100 mmol/L (ref 98–111)
Creatinine, Ser: 1.35 mg/dL — ABNORMAL HIGH (ref 0.61–1.24)
GFR, Estimated: >60 mL/min (ref >60)
Glucose, Bld: 114 mg/dL — ABNORMAL HIGH (ref 70–99)
Potassium: 3.7 mmol/L (ref 3.5–5.1)
Sodium: 142 mmol/L (ref 135–145)
Total Bilirubin: 0.7 mg/dL (ref 0.3–1.2)
Total Protein: 8.5 g/dL — ABNORMAL HIGH (ref 6.5–8.1)

## 2020-10-08 LAB — CBC WITH DIFFERENTIAL/PLATELET
Abs Immature Granulocytes: 0.02 10*3/uL (ref 0.00–0.07)
Basophils Absolute: 0.1 10*3/uL (ref 0.0–0.1)
Basophils Relative: 1 %
Eosinophils Absolute: 0 10*3/uL (ref 0.0–0.5)
Eosinophils Relative: 1 %
HCT: 39.3 % (ref 39.0–52.0)
Hemoglobin: 13.1 g/dL (ref 13.0–17.0)
Immature Granulocytes: 0 %
Lymphocytes Relative: 18 %
Lymphs Abs: 1.1 10*3/uL (ref 0.7–4.0)
MCH: 29.6 pg (ref 26.0–34.0)
MCHC: 33.3 g/dL (ref 30.0–36.0)
MCV: 88.9 fL (ref 80.0–100.0)
Monocytes Absolute: 0.4 10*3/uL (ref 0.1–1.0)
Monocytes Relative: 7 %
Neutro Abs: 4.4 10*3/uL (ref 1.7–7.7)
Neutrophils Relative %: 73 %
Platelets: 215 10*3/uL (ref 150–400)
RBC: 4.42 MIL/uL (ref 4.22–5.81)
RDW: 13.8 % (ref 11.5–15.5)
WBC: 6 10*3/uL (ref 4.0–10.5)
nRBC: 0 % (ref 0.0–0.2)

## 2020-10-08 LAB — BLOOD GAS, VENOUS
Acid-base deficit: 3.5 mmol/L — ABNORMAL HIGH (ref 0.0–2.0)
Bicarbonate: 20.9 mmol/L (ref 20.0–28.0)
O2 Saturation: 55.4 %
Patient temperature: 98.6
pCO2, Ven: 37.2 mmHg — ABNORMAL LOW (ref 44.0–60.0)
pH, Ven: 7.367 (ref 7.250–7.430)
pO2, Ven: 36.6 mmHg (ref 32.0–45.0)

## 2020-10-08 LAB — RESP PANEL BY RT-PCR (FLU A&B, COVID) ARPGX2
Influenza A by PCR: NEGATIVE
Influenza B by PCR: NEGATIVE
SARS Coronavirus 2 by RT PCR: NEGATIVE

## 2020-10-08 LAB — URINALYSIS, ROUTINE W REFLEX MICROSCOPIC
Bilirubin Urine: NEGATIVE
Glucose, UA: >=500 mg/dL — AB
Hgb urine dipstick: NEGATIVE
Ketones, ur: 80 mg/dL — AB
Leukocytes,Ua: NEGATIVE
Nitrite: NEGATIVE
Protein, ur: 100 mg/dL — AB
Specific Gravity, Urine: >1.046 — ABNORMAL HIGH (ref 1.005–1.030)
pH: 6 (ref 5.0–8.0)

## 2020-10-08 LAB — TROPONIN I (HIGH SENSITIVITY)
Troponin I (High Sensitivity): 8 ng/L (ref <18)
Troponin I (High Sensitivity): 10 ng/L (ref <18)

## 2020-10-08 LAB — BRAIN NATRIURETIC PEPTIDE: B Natriuretic Peptide: 202.1 pg/mL — ABNORMAL HIGH (ref 0.0–100.0)

## 2020-10-08 LAB — LACTIC ACID, PLASMA
Lactic Acid, Venous: 8.8 mmol/L (ref 0.5–1.9)
Lactic Acid, Venous: 4.7 mmol/L (ref 0.5–1.9)

## 2020-10-08 LAB — D-DIMER, QUANTITATIVE: D-Dimer, Quant: 1.05 ug/mL-FEU — ABNORMAL HIGH (ref 0.00–0.50)

## 2020-10-08 LAB — ETHANOL: Alcohol, Ethyl (B): 180 mg/dL — ABNORMAL HIGH (ref <10)

## 2020-10-08 LAB — LIPASE, BLOOD: Lipase: 25 U/L (ref 11–51)

## 2020-10-08 LAB — MAGNESIUM: Magnesium: 1.4 mg/dL — ABNORMAL LOW (ref 1.7–2.4)

## 2020-10-08 MED ORDER — SODIUM CHLORIDE 0.9 % IV BOLUS
30.0000 mL/kg | Freq: Once | INTRAVENOUS | Status: AC
  Administered 2020-10-08: 2190 mL via INTRAVENOUS

## 2020-10-08 MED ORDER — BENZOCAINE 10 % MT GEL
Freq: Three times a day (TID) | OROMUCOSAL | Status: DC | PRN
  Filled 2020-10-08: qty 9.4

## 2020-10-08 MED ORDER — VANCOMYCIN HCL IN DEXTROSE 1-5 GM/200ML-% IV SOLN
1000.0000 mg | Freq: Two times a day (BID) | INTRAVENOUS | Status: DC
  Filled 2020-10-08: qty 200

## 2020-10-08 MED ORDER — MAGNESIUM OXIDE -MG SUPPLEMENT 400 (240 MG) MG PO TABS
400.0000 mg | ORAL_TABLET | Freq: Every day | ORAL | Status: DC
  Administered 2020-10-09 – 2020-10-10 (×2): 400 mg via ORAL
  Filled 2020-10-08 (×3): qty 1

## 2020-10-08 MED ORDER — IOHEXOL 350 MG/ML SOLN
100.0000 mL | Freq: Once | INTRAVENOUS | Status: AC | PRN
  Administered 2020-10-08: 100 mL via INTRAVENOUS

## 2020-10-08 MED ORDER — LORAZEPAM 1 MG PO TABS
1.0000 mg | ORAL_TABLET | ORAL | Status: DC | PRN
  Administered 2020-10-09 (×2): 2 mg via ORAL
  Administered 2020-10-09: 4 mg via ORAL
  Administered 2020-10-10: 1 mg via ORAL
  Filled 2020-10-08 (×2): qty 2
  Filled 2020-10-08: qty 4
  Filled 2020-10-08: qty 1

## 2020-10-08 MED ORDER — FOLIC ACID 1 MG PO TABS
1.0000 mg | ORAL_TABLET | Freq: Every day | ORAL | Status: DC
  Administered 2020-10-08 – 2020-10-12 (×5): 1 mg via ORAL
  Filled 2020-10-08 (×5): qty 1

## 2020-10-08 MED ORDER — VANCOMYCIN HCL IN DEXTROSE 1-5 GM/200ML-% IV SOLN: 1000.0000 mg | Freq: Once | INTRAVENOUS | Status: DC

## 2020-10-08 MED ORDER — LORAZEPAM 1 MG PO TABS: 0.0000 mg | ORAL_TABLET | Freq: Two times a day (BID) | ORAL | Status: DC

## 2020-10-08 MED ORDER — ENOXAPARIN SODIUM 40 MG/0.4ML IJ SOSY
40.0000 mg | PREFILLED_SYRINGE | INTRAMUSCULAR | Status: DC
  Administered 2020-10-08 – 2020-10-12 (×5): 40 mg via SUBCUTANEOUS
  Filled 2020-10-08 (×5): qty 0.4

## 2020-10-08 MED ORDER — QUETIAPINE FUMARATE 50 MG PO TABS
150.0000 mg | ORAL_TABLET | Freq: Every day | ORAL | Status: DC
  Administered 2020-10-09 – 2020-10-10 (×2): 150 mg via ORAL
  Filled 2020-10-08 (×3): qty 1

## 2020-10-08 MED ORDER — THIAMINE HCL 100 MG PO TABS
100.0000 mg | ORAL_TABLET | Freq: Every day | ORAL | Status: DC
  Administered 2020-10-09 – 2020-10-12 (×3): 100 mg via ORAL
  Filled 2020-10-08 (×4): qty 1

## 2020-10-08 MED ORDER — LORAZEPAM 2 MG/ML IJ SOLN
2.0000 mg | Freq: Once | INTRAMUSCULAR | Status: AC
  Administered 2020-10-08: 2 mg via INTRAVENOUS
  Filled 2020-10-08: qty 1

## 2020-10-08 MED ORDER — SODIUM CHLORIDE 0.9 % IV SOLN
2.0000 g | Freq: Once | INTRAVENOUS | Status: AC
  Administered 2020-10-08: 2 g via INTRAVENOUS
  Filled 2020-10-08: qty 2

## 2020-10-08 MED ORDER — IVABRADINE HCL 5 MG PO TABS
7.5000 mg | ORAL_TABLET | Freq: Two times a day (BID) | ORAL | Status: DC
  Administered 2020-10-09 – 2020-10-10 (×3): 7.5 mg via ORAL
  Filled 2020-10-08 (×6): qty 2

## 2020-10-08 MED ORDER — NICOTINE 7 MG/24HR TD PT24
7.0000 mg | MEDICATED_PATCH | Freq: Every day | TRANSDERMAL | Status: DC
  Administered 2020-10-09: 7 mg via TRANSDERMAL
  Filled 2020-10-08: qty 1

## 2020-10-08 MED ORDER — ADULT MULTIVITAMIN W/MINERALS CH
1.0000 | ORAL_TABLET | Freq: Every day | ORAL | Status: DC
  Administered 2020-10-08 – 2020-10-12 (×5): 1 via ORAL
  Filled 2020-10-08 (×5): qty 1

## 2020-10-08 MED ORDER — LORAZEPAM 2 MG/ML IJ SOLN: 0.0000 mg | Freq: Two times a day (BID) | INTRAMUSCULAR | Status: DC

## 2020-10-08 MED ORDER — LORAZEPAM 1 MG PO TABS: 0.0000 mg | ORAL_TABLET | Freq: Four times a day (QID) | ORAL | Status: DC

## 2020-10-08 MED ORDER — SODIUM CHLORIDE 0.9 % IV BOLUS
250.0000 mL | Freq: Once | INTRAVENOUS | Status: AC
  Administered 2020-10-08: 250 mL via INTRAVENOUS

## 2020-10-08 MED ORDER — SODIUM CHLORIDE 0.9 % IV SOLN: INTRAVENOUS | Status: AC

## 2020-10-08 MED ORDER — ONDANSETRON HCL 4 MG/2ML IJ SOLN
4.0000 mg | Freq: Once | INTRAMUSCULAR | Status: AC
  Administered 2020-10-08: 4 mg via INTRAVENOUS
  Filled 2020-10-08: qty 2

## 2020-10-08 MED ORDER — OXYCODONE HCL 5 MG PO TABS
5.0000 mg | ORAL_TABLET | Freq: Once | ORAL | Status: AC
  Administered 2020-10-09: 5 mg via ORAL
  Filled 2020-10-08: qty 1

## 2020-10-08 MED ORDER — DAPAGLIFLOZIN PROPANEDIOL 10 MG PO TABS
10.0000 mg | ORAL_TABLET | Freq: Every day | ORAL | Status: DC
  Administered 2020-10-09 – 2020-10-10 (×2): 10 mg via ORAL
  Filled 2020-10-08 (×2): qty 1

## 2020-10-08 MED ORDER — LORAZEPAM 2 MG/ML IJ SOLN
1.0000 mg | INTRAMUSCULAR | Status: DC | PRN
  Administered 2020-10-09 – 2020-10-10 (×2): 4 mg via INTRAVENOUS
  Administered 2020-10-10: 3 mg via INTRAVENOUS
  Administered 2020-10-10 (×2): 4 mg via INTRAVENOUS
  Filled 2020-10-08 (×6): qty 2

## 2020-10-08 MED ORDER — CARVEDILOL 3.125 MG PO TABS
3.1250 mg | ORAL_TABLET | Freq: Two times a day (BID) | ORAL | Status: DC
  Administered 2020-10-08 – 2020-10-12 (×8): 3.125 mg via ORAL
  Filled 2020-10-08 (×8): qty 1

## 2020-10-08 MED ORDER — SODIUM CHLORIDE 0.9 % IV SOLN: 500.0000 mg | Freq: Once | INTRAVENOUS | Status: DC

## 2020-10-08 MED ORDER — THIAMINE HCL 100 MG/ML IJ SOLN
100.0000 mg | Freq: Every day | INTRAMUSCULAR | Status: DC
  Administered 2020-10-08 – 2020-10-11 (×2): 100 mg via INTRAVENOUS
  Filled 2020-10-08 (×3): qty 2

## 2020-10-08 MED ORDER — ALBUTEROL SULFATE (2.5 MG/3ML) 0.083% IN NEBU: 2.5000 mg | INHALATION_SOLUTION | Freq: Four times a day (QID) | RESPIRATORY_TRACT | Status: DC | PRN

## 2020-10-08 MED ORDER — PANTOPRAZOLE SODIUM 40 MG IV SOLR
40.0000 mg | INTRAVENOUS | Status: DC
  Administered 2020-10-08: 40 mg via INTRAVENOUS
  Filled 2020-10-08: qty 40

## 2020-10-08 MED ORDER — SUCRALFATE 1 G PO TABS
1.0000 g | ORAL_TABLET | Freq: Three times a day (TID) | ORAL | Status: DC
  Administered 2020-10-08 – 2020-10-12 (×12): 1 g via ORAL
  Filled 2020-10-08 (×13): qty 1

## 2020-10-08 MED ORDER — PANTOPRAZOLE SODIUM 40 MG PO TBEC
40.0000 mg | DELAYED_RELEASE_TABLET | Freq: Every day | ORAL | Status: DC
  Administered 2020-10-09 – 2020-10-12 (×3): 40 mg via ORAL
  Filled 2020-10-08 (×3): qty 1

## 2020-10-08 MED ORDER — LORAZEPAM 2 MG/ML IJ SOLN: 0.0000 mg | Freq: Four times a day (QID) | INTRAMUSCULAR | Status: DC

## 2020-10-08 MED ORDER — DIGOXIN 125 MCG PO TABS
0.1250 mg | ORAL_TABLET | Freq: Every day | ORAL | Status: DC
  Administered 2020-10-09 – 2020-10-12 (×4): 0.125 mg via ORAL
  Filled 2020-10-08 (×4): qty 1

## 2020-10-08 MED ORDER — NICOTINE 21 MG/24HR TD PT24
21.0000 mg | MEDICATED_PATCH | Freq: Every day | TRANSDERMAL | Status: DC
  Administered 2020-10-08: 21 mg via TRANSDERMAL
  Filled 2020-10-08: qty 1

## 2020-10-08 MED ORDER — VANCOMYCIN HCL 1750 MG/350ML IV SOLN
1750.0000 mg | Freq: Once | INTRAVENOUS | Status: AC
  Administered 2020-10-08: 1750 mg via INTRAVENOUS
  Filled 2020-10-08: qty 350

## 2020-10-08 MED ORDER — SODIUM CHLORIDE 0.9 % IV SOLN
2.0000 g | Freq: Three times a day (TID) | INTRAVENOUS | Status: DC
  Administered 2020-10-09: 2 g via INTRAVENOUS
  Filled 2020-10-08 (×2): qty 2

## 2020-10-08 MED ORDER — SODIUM CHLORIDE (PF) 0.9 % IJ SOLN
INTRAMUSCULAR | Status: AC
  Filled 2020-10-08: qty 50

## 2020-10-08 NOTE — ED Provider Notes (Signed)
[facility] DEPT Provider Note   CSN: [id number] Arrival date & time: 10/08/20  1606     History Chief Complaint  Patient presents with  . Weakness  . Abdominal Pain  . Cough  . Chest Pain    Robert [name] is a 40 y.o. male.  History of CHF, EF 30% from echo 2/22. Also pulmonary hypertension on cath from 2021. 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  Multiple ED visits this month.   The history is provided by the patient.  Chest Pain Pain location:  L chest Pain quality: sharp   Pain radiates to:  Does not radiate Pain severity:  Severe Onset quality:  Sudden Duration:  1 day Timing:  Constant Progression:  Unchanged Chronicity:  Recurrent Context: at rest   Relieved by:  Nothing Worsened by:  Nothing Ineffective treatments:  None tried Associated symptoms: abdominal pain (left-sided), cough, nausea, shortness of breath and vomiting   Associated symptoms: no back pain, no fever and no palpitations        Past Medical History:  Diagnosis Date  . Alcohol use   . Alcohol withdrawal ([place]) 03/06/2020  . Asthma   . CHF (congestive heart failure) [facility])     Patient Active Problem List   Diagnosis Date Noted  . Delirium tremens ([place]) 09/02/2020  . Hypomagnesemia 08/19/2020  . Seizure ([place]) 07/10/2020  . Mood disorder ([place]) 05/16/2020  . Hypotension 03/24/2020  . Polysubstance abuse ([place]) 03/24/2020  . Nausea & vomiting 03/06/2020  . AKI (acute kidney injury) ([place]) 10/30/2019  . Gastritis 10/30/2019  . Elevated INR 10/30/2019  . 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[place])   . Acute heart failure ([place]) 10/05/2019  . Pulmonary embolism ([place]) 10/04/2019  . Chronic systolic CHF (congestive heart failure) ([place])   . Inappropriate sinus tachycardia   . Chest pain 11/03/2013  . Tobacco abuse 11/03/2013    Past Surgical History:  Procedure Laterality Date  . ESOPHAGOGASTRODUODENOSCOPY (EGD) WITH PROPOFOL N/A 11/01/2019    Procedure: ESOPHAGOGASTRODUODENOSCOPY (EGD) WITH PROPOFOL;  Surgeon: [name], MD;  Location: [facility] ENDOSCOPY;  Service: Endoscopy;  Laterality: N/A;  . RIGHT/LEFT HEART CATH AND CORONARY ANGIOGRAPHY N/A 10/06/2019   Procedure: RIGHT/LEFT HEART CATH AND CORONARY ANGIOGRAPHY;  Surgeon: [name], MD;  Location: [place] CV LAB;  Service: Cardiovascular;  Laterality: N/A;  . WRIST SURGERY         Family History  Problem Relation Age of Onset  . Heart failure Father   . Diabetes Father   . Kidney disease Father   . Heart disease Father 16  . Heart disease Maternal Grandfather 47  . Heart disease Paternal Grandfather 8    Social History   Tobacco Use  . Smoking status: Current Every Day Smoker    Packs/day: 0.25    Years: 24.00    Pack years: 6.00    Types: Cigarettes  . Smokeless tobacco: Never Used  Vaping Use  . Vaping Use: Former  Substance Use Topics  . Alcohol use: Yes    Alcohol/week: 8.0 standard drinks    Types: 8 Shots of liquor per week    Comment: heavy use since age 38  . Drug use: Yes    Frequency: 1.0 times per week    Types: Marijuana    Home Medications Prior to Admission medications   Medication Sig Start Date End Date Taking? Authorizing Provider  carvedilol (COREG) 3.125 MG tablet Take 1 tablet (3.125 mg total) by mouth  2 (two) times daily. 04/11/20 04/11/21  Clegg, Amy D, NP  digoxin (LANOXIN) 0.125 MG tablet Take 1 tablet (0.125 mg total) by mouth daily. 06/14/20   Simmons, Brittainy M, PA-C  FARXIGA 10 MG TABS tablet TAKE 1 TABLET BY MOUTH ONCE DAILY BEFORE BREAKFAST 09/07/20   Bensimhon, Shaune Pascal, MD  folic acid (FOLVITE) 1 MG tablet Take 1 tablet (1 mg total) by mouth daily. 03/16/20   Bensimhon, Shaune Pascal, MD  ivabradine (CORLANOR) 7.5 MG TABS tablet Take 1 tablet (7.5 mg total) by mouth 2 (two) times daily with a meal. 03/27/20   Bensimhon, Shaune Pascal, MD  LORazepam (ATIVAN) 1 MG tablet Take 1 tablet (1 mg total) by mouth every 6 (six) hours  as needed for anxiety. 08/20/20   Arrien, Jimmy Picket, MD  ondansetron (ZOFRAN ODT) 4 MG disintegrating tablet Take 1 tablet (4 mg total) by mouth every 8 (eight) hours as needed for nausea or vomiting. 08/20/20   Arrien, Jimmy Picket, MD  pantoprazole (PROTONIX) 40 MG tablet Take 1 tablet (40 mg total) by mouth daily. 08/20/20 09/19/20  Arrien, Jimmy Picket, MD  QUEtiapine (SEROQUEL) 100 MG tablet Take 1.5 tablets (150 mg total) by mouth daily. 06/23/20   Nche, Charlene Brooke, NP  sacubitril-valsartan (ENTRESTO) 97-103 MG Take 1 tablet by mouth 2 (two) times daily. 06/16/20   Lyda Jester M, PA-C  sertraline (ZOLOFT) 25 MG tablet Take 25 mg by mouth daily as needed (anxiety).    [provider]  spironolactone (ALDACTONE) 25 MG tablet Take 1 tablet (25 mg total) by mouth daily. HOLD until 03/26/20 03/24/20   Desiree Hane, MD  sucralfate (CARAFATE) 1 g tablet Take 1 tablet (1 g total) by mouth 4 (four) times daily -  with meals and at bedtime. Patient not taking: Reported on 10/02/2020 07/31/20   Bonnielee Haff, MD  thiamine 100 MG tablet Take 1 tablet (100 mg total) by mouth daily. 07/31/20   Bonnielee Haff, MD    Allergies    Bactrim [sulfamethoxazole-trimethoprim]  Review of Systems   Review of Systems  Constitutional: Negative for chills and fever.  HENT: Negative for ear pain and sore throat.   Eyes: Negative for pain and visual disturbance.  Respiratory: Positive for cough and shortness of breath.   Cardiovascular: Positive for chest pain. Negative for palpitations.  Gastrointestinal: Positive for abdominal pain (left-sided), nausea and vomiting.  Genitourinary: Negative for dysuria and hematuria.  Musculoskeletal: Negative for arthralgias and back pain.  Skin: Negative for color change and rash.  Neurological: Negative for seizures and syncope.  All other systems reviewed and are negative.   Physical Exam Updated Vital Signs BP (!) 145/109 (BP Location:  Right Arm)   Pulse (!) 130   Temp 98.2 F (36.8 C) (Oral)   Resp 18   Ht 5\' 10"  (1.778 m)   Wt 84.4 kg   SpO2 100%   BMI 26.69 kg/m   Physical Exam Vitals and nursing note reviewed.  Constitutional:      Appearance: He is well-developed. He is ill-appearing.     Comments: Vomiting, tearful  HENT:     Head: Normocephalic and atraumatic.     Mouth/Throat:     Comments: Dry, cracked lips Eyes:     Conjunctiva/sclera: Conjunctivae normal.  Cardiovascular:     Rate and Rhythm: Regular rhythm. Tachycardia present.     Heart sounds: No murmur heard.   Pulmonary:     Effort: Pulmonary effort is normal. No respiratory distress.  Breath sounds: Normal breath sounds.  Abdominal:     Palpations: Abdomen is soft.     Tenderness: There is no abdominal tenderness.  Musculoskeletal:     Cervical back: Neck supple.  Skin:    General: Skin is warm and dry.     Findings: No erythema.  Neurological:     General: No focal deficit present.     Mental Status: He is alert and oriented to person, place, and time.     Comments: Mildly, diffusely shaky  Psychiatric:        Mood and Affect: Mood normal.     ED Results / Procedures / Treatments   Labs (all labs ordered are listed, but only abnormal results are displayed) Labs Reviewed  COMPREHENSIVE METABOLIC PANEL - Abnormal; Notable for the following components:      Result Value   CO2 18 (*)    Glucose, Bld 114 (*)    Creatinine, Ser 1.35 (*)    Total Protein 8.5 (*)    AST 99 (*)    ALT 74 (*)    Anion gap 24 (*)    All other components within normal limits  ETHANOL - Abnormal; Notable for the following components:   Alcohol, Ethyl (B) 180 (*)    All other components within normal limits  BRAIN NATRIURETIC PEPTIDE - Abnormal; Notable for the following components:   B Natriuretic Peptide 202.1 (*)    All other components within normal limits  D-DIMER, QUANTITATIVE - Abnormal; Notable for the following components:    D-Dimer, Quant 1.05 (*)    All other components within normal limits  LACTIC ACID, PLASMA - Abnormal; Notable for the following components:   Lactic Acid, Venous 8.8 (*)    All other components within normal limits  LACTIC ACID, PLASMA - Abnormal; Notable for the following components:   Lactic Acid, Venous 4.7 (*)    All other components within normal limits  BLOOD GAS, VENOUS - Abnormal; Notable for the following components:   pCO2, Ven 37.2 (*)    Acid-base deficit 3.5 (*)    All other components within normal limits  RESP PANEL BY RT-PCR (FLU A&B, COVID) ARPGX2  CULTURE, BLOOD (ROUTINE X 2)  CULTURE, BLOOD (ROUTINE X 2)  LIPASE, BLOOD  CBC WITH DIFFERENTIAL/PLATELET  URINALYSIS, ROUTINE W REFLEX MICROSCOPIC  RAPID URINE DRUG SCREEN, HOSP PERFORMED  MAGNESIUM  PHOSPHORUS  HIV ANTIBODY (ROUTINE TESTING W REFLEX)  D-DIMER, QUANTITATIVE  BASIC METABOLIC PANEL  TROPONIN I (HIGH SENSITIVITY)  TROPONIN I (HIGH SENSITIVITY)    EKG EKG Interpretation  Date/Time:  Sunday Oct 08 2020 17:00:32 EDT Ventricular Rate:  99 PR Interval:  197 QRS Duration: 87 QT Interval:  407 QTC Calculation: 523 R Axis:   45 Text Interpretation: Sinus tachycardia Ventricular premature complex Consider right atrial enlargement Abnormal R-wave progression, late transition Abnrm T, consider ischemia, anterolateral lds: T wave inversions V2, V4-V6 all similar to prior Prolonged QT interval Similar to prior Confirmed by Lorre Munroe ([id number]) on 10/08/2020 5:08:56 PM   Radiology CT Angio Chest PE W/Cm &/Or Wo Cm  Result Date: 10/08/2020 CLINICAL DATA:  Shortness of breath, abdominal pain EXAM: CT ANGIOGRAPHY CHEST CT ABDOMEN AND PELVIS WITH CONTRAST TECHNIQUE: Multidetector CT imaging of the chest was performed using the standard protocol during bolus administration of intravenous contrast. Multiplanar CT image reconstructions and MIPs were obtained to evaluate the vascular anatomy. Multidetector CT imaging of  the abdomen and pelvis was performed using the standard protocol during bolus  administration of intravenous contrast. CONTRAST:  166mL OMNIPAQUE IOHEXOL 350 MG/ML SOLN COMPARISON:  Oct 04, 2019 FINDINGS: CTA CHEST FINDINGS Cardiovascular: Evaluation of the lung bases is limited secondary to respiratory motion. No evidence of acute pulmonary embolism through the segmental pulmonary arteries. Evaluation of the distal arteries is limited secondary to motion. Mild cardiomegaly. No pericardial effusion. Mediastinum/Nodes: Thyroid is unremarkable. No axillary or mediastinal adenopathy. Lungs/Pleura: There is a cystic area of the LEFT lower lobe with internal frothy debris. It measures approximately 19 mm (series 6, image 94). There is an adjacent cystic area superiorly which measures approximately 13 mm. LEFT lower lobe atelectasis. Musculoskeletal: No acute osseous abnormality. Review of the MIP images confirms the above findings. CT ABDOMEN and PELVIS FINDINGS Hepatobiliary: Hepatic steatosis. Gallbladder is unremarkable. No intrahepatic or extrahepatic biliary ductal dilation. Portal vein is patent. Pancreas: Unremarkable. No pancreatic ductal dilatation or surrounding inflammatory changes. Spleen: Normal in size without focal abnormality. Adrenals/Urinary Tract: Adrenal glands are unremarkable. No hydronephrosis. Kidneys enhance symmetrically. Subcentimeter hypodense lesion of the superior pole of the LEFT kidney is too small to accurately characterize. Bladder is unremarkable. Stomach/Bowel: No evidence of bowel obstruction. Appendix is normal. Diffuse submucosal fatty deposition throughout the colon, nonspecific and commonly seen in obesity. Stomach is unremarkable. Vascular/Lymphatic: Mild atherosclerotic calcifications of the aorta. No suspicious lymph nodes. Reproductive: Prostate is unremarkable. Other: No free air or free fluid. Musculoskeletal: No acute or significant osseous findings. Review of the MIP images  confirms the above findings. IMPRESSION: 1. No evidence of acute pulmonary embolism. 2. There is a pneumatocele of the inferior LEFT lower lobe with layering frothy fluid. This may reflect superimposed infection. Recommend correlation with symptomatology. 3. Profound hepatic steatosis. 4. No CT etiology for acute abdominal pain identified. Aortic Atherosclerosis (ICD10-I70.0). Electronically Signed   By: Valentino Saxon MD   On: 10/08/2020 19:02   CT ABDOMEN PELVIS W CONTRAST  Result Date: 10/08/2020 CLINICAL DATA:  Shortness of breath, abdominal pain EXAM: CT ANGIOGRAPHY CHEST CT ABDOMEN AND PELVIS WITH CONTRAST TECHNIQUE: Multidetector CT imaging of the chest was performed using the standard protocol during bolus administration of intravenous contrast. Multiplanar CT image reconstructions and MIPs were obtained to evaluate the vascular anatomy. Multidetector CT imaging of the abdomen and pelvis was performed using the standard protocol during bolus administration of intravenous contrast. CONTRAST:  135mL OMNIPAQUE IOHEXOL 350 MG/ML SOLN COMPARISON:  Oct 04, 2019 FINDINGS: CTA CHEST FINDINGS Cardiovascular: Evaluation of the lung bases is limited secondary to respiratory motion. No evidence of acute pulmonary embolism through the segmental pulmonary arteries. Evaluation of the distal arteries is limited secondary to motion. Mild cardiomegaly. No pericardial effusion. Mediastinum/Nodes: Thyroid is unremarkable. No axillary or mediastinal adenopathy. Lungs/Pleura: There is a cystic area of the LEFT lower lobe with internal frothy debris. It measures approximately 19 mm (series 6, image 94). There is an adjacent cystic area superiorly which measures approximately 13 mm. LEFT lower lobe atelectasis. Musculoskeletal: No acute osseous abnormality. Review of the MIP images confirms the above findings. CT ABDOMEN and PELVIS FINDINGS Hepatobiliary: Hepatic steatosis. Gallbladder is unremarkable. No intrahepatic or  extrahepatic biliary ductal dilation. Portal vein is patent. Pancreas: Unremarkable. No pancreatic ductal dilatation or surrounding inflammatory changes. Spleen: Normal in size without focal abnormality. Adrenals/Urinary Tract: Adrenal glands are unremarkable. No hydronephrosis. Kidneys enhance symmetrically. Subcentimeter hypodense lesion of the superior pole of the LEFT kidney is too small to accurately characterize. Bladder is unremarkable. Stomach/Bowel: No evidence of bowel obstruction. Appendix is normal. Diffuse submucosal fatty  deposition throughout the colon, nonspecific and commonly seen in obesity. Stomach is unremarkable. Vascular/Lymphatic: Mild atherosclerotic calcifications of the aorta. No suspicious lymph nodes. Reproductive: Prostate is unremarkable. Other: No free air or free fluid. Musculoskeletal: No acute or significant osseous findings. Review of the MIP images confirms the above findings. IMPRESSION: 1. No evidence of acute pulmonary embolism. 2. There is a pneumatocele of the inferior LEFT lower lobe with layering frothy fluid. This may reflect superimposed infection. Recommend correlation with symptomatology. 3. Profound hepatic steatosis. 4. No CT etiology for acute abdominal pain identified. Aortic Atherosclerosis (ICD10-I70.0). Electronically Signed   By: Valentino Saxon MD   On: 10/08/2020 19:02   DG Chest Port 1 View  Result Date: 10/08/2020 CLINICAL DATA:  40 year old male with chest pain EXAM: PORTABLE CHEST 1 VIEW COMPARISON:  Chest radiograph dated 10/01/2020. FINDINGS: No focal consolidation, pleural effusion, pneumothorax. Mild cardiomegaly. No acute osseous pathology. IMPRESSION: No acute cardiopulmonary process. Electronically Signed   By: Anner Crete M.D.   On: 10/08/2020 18:08    Procedures .Critical Care Performed by: Arnaldo Natal, MD Authorized by: Arnaldo Natal, MD   Critical care provider statement:    Critical care time (minutes):  45   Critical  care time was exclusive of:  Separately billable procedures and treating other patients and teaching time   Critical care was necessary to treat or prevent imminent or life-threatening deterioration of the following conditions:  Dehydration, metabolic crisis and sepsis   Critical care was time spent personally by me on the following activities:  Discussions with consultants, evaluation of patient's response to treatment, examination of patient, ordering and performing treatments and interventions, ordering and review of laboratory studies, ordering and review of radiographic studies, pulse oximetry, re-evaluation of patient's condition, obtaining history from patient or surrogate, review of old charts and development of treatment plan with patient or surrogate   I assumed direction of critical care for this patient from another provider in my specialty: no     Care discussed with: admitting provider       Medications Ordered in ED Medications  thiamine tablet 100 mg ( Oral See Alternative 10/08/20 1707)    Or  thiamine (B-1) injection 100 mg (100 mg Intravenous Given 10/08/20 1707)  nicotine (NICODERM CQ - dosed in mg/24 hr) patch 7 mg (has no administration in time range)  carvedilol (COREG) tablet 3.125 mg (has no administration in time range)  digoxin (LANOXIN) tablet 0.125 mg (has no administration in time range)  ivabradine (CORLANOR) tablet 7.5 mg (has no administration in time range)  QUEtiapine (SEROQUEL) tablet 150 mg (has no administration in time range)  dapagliflozin propanediol (FARXIGA) tablet 10 mg (has no administration in time range)  magnesium oxide (MAG-OX) tablet 400 mg (has no administration in time range)  sucralfate (CARAFATE) tablet 1 g (has no administration in time range)  LORazepam (ATIVAN) tablet 1-4 mg (has no administration in time range)    Or  LORazepam (ATIVAN) injection 1-4 mg (has no administration in time range)  folic acid (FOLVITE) tablet 1 mg (has no  administration in time range)  multivitamin with minerals tablet 1 tablet (has no administration in time range)  enoxaparin (LOVENOX) injection 40 mg (has no administration in time range)  0.9 %  sodium chloride infusion (has no administration in time range)  albuterol (PROVENTIL) (2.5 MG/3ML) 0.083% nebulizer solution 2.5 mg (has no administration in time range)  pantoprazole (PROTONIX) injection 40 mg (has no administration in time range)  vancomycin (VANCOREADY) IVPB 1750 mg/350 mL (has no administration in time range)  vancomycin (VANCOCIN) IVPB 1000 mg/200 mL premix (has no administration in time range)  ceFEPIme (MAXIPIME) 2 g in sodium chloride 0.9 % 100 mL IVPB (has no administration in time range)  ondansetron (ZOFRAN) injection 4 mg (4 mg Intravenous Given 10/08/20 1708)  sodium chloride 0.9 % bolus 250 mL (0 mLs Intravenous Stopped 10/08/20 1748)  LORazepam (ATIVAN) injection 2 mg (2 mg Intravenous Given 10/08/20 1706)  sodium chloride 0.9 % bolus 2,190 mL (2,190 mLs Intravenous Transfusing/Transfer 10/08/20 2132)  sodium chloride (PF) 0.9 % injection (  Given 10/08/20 1851)  iohexol (OMNIPAQUE) 350 MG/ML injection 100 mL (100 mLs Intravenous Contrast Given 10/08/20 1832)  ceFEPIme (MAXIPIME) 2 g in sodium chloride 0.9 % 100 mL IVPB (0 g Intravenous Stopped 10/08/20 2128)    ED Course  I have reviewed the triage vital signs and the nursing notes.  Pertinent labs & imaging results that were available during my care of the patient were reviewed by me and considered in my medical decision making (see chart for details).  Clinical Course as of 10/08/20 2148  Nancy Fetter Oct 08, 2020  1740 Lactic Acid, Venous(!!): 8.8 [AW]  2122 I spoke with Dr. Marlowe Sax from [facility]. [AW]    Clinical Course User Index [AW] Arnaldo Natal, MD   MDM Rules/Calculators/A&P                          Robert [name] is 40 years old.  He has a history of congestive heart failure and alcohol use disorder.  He  presented with left-sided chest pain, cough, nausea, vomiting, and left upper quadrant abdominal pain.  He was noted to be quite tachycardic, and his initial lactic acid was elevated at 8.8.  Despite his history of congestive heart failure, he did appear to be volume depleted, and I gave him fluids somewhat cautiously at first.  However, when his lactic acid returned at 8.8, he was given a full 30 cc/kg bolus of IV fluid with good response in both lactic acid and his heart rate.  He was evaluated for CHF exacerbation, acute coronary syndrome, PE, acute intra-abdominal emergency.  Based on his CT scan, it appears that he has a pneumatocele that may be infected.  This does fit with the location of his pain.  He was given antibiotics for possible healthcare associated pneumonia given his recent hospitalization.  He will be admitted for ongoing management.  Because of his alcohol use disorder, he was maintained here in the ED on CIWA protocol. Final Clinical Impression(s) / ED Diagnoses Final diagnoses:  Healthcare-associated pneumonia  Sepsis without acute organ dysfunction, due to unspecified organism ([place])  Lactic acidosis  Alcoholic intoxication with complication ([place])  Chronic systolic congestive heart failure [facility])    Rx / DC Orders ED Discharge Orders    None       Arnaldo Natal, MD 10/08/20 2150

## 2020-10-08 NOTE — ED Notes (Signed)
Dr. Rathore at bedside 

## 2020-10-08 NOTE — ED Provider Notes (Signed)
Emergency Medicine Provider Triage Evaluation Note  Robert [name] , a 40 y.o. male  was evaluated in triage.  Pt complains of nv, luq abd pain, generalized weakness and lightheadedness. Further reports chest pain and sob. Last etoh was 3-4 days ago.   Review of Systems  Positive: nv, luq abd pain, generalized weakness and lightheadedness, cp, sob, cough Negative: fever  Physical Exam  BP (!) 145/109 (BP Location: Right Arm)   Pulse (!) 130   Temp 98.2 F (36.8 C) (Oral)   Resp 18   Ht 5\' 10"  (1.778 m)   Wt 84.4 kg   SpO2 100%   BMI 26.69 kg/m  Gen:   Awake, no distress   Resp:  Normal effort  MSK:   Moves extremities without difficulty  Other:  luq ttp, tachycardic  Medical Decision Making  Medically screening exam initiated at 4:27 PM.  Appropriate orders placed.  Robert [name] was informed that the remainder of the evaluation will be completed by another provider, this initial triage assessment does not replace that evaluation, and the importance of remaining in the ED until their evaluation is complete.     [name] 10/08/20 1627    Dorie Rank, MD 10/09/20 1534

## 2020-10-08 NOTE — Progress Notes (Signed)
Pharmacy Antibiotic Note  Robert [name] is a 40 y.o. male admitted on 10/08/2020 with pneumonia.  Pharmacy has been consulted for vanc/cefepime dosing.  Plan:  Vanc 1750mg  IV x 1 then 1g IV q12 - goal AUC 400-550  Cefepime 2g IV q8  Height: 5\' 10"  (177.8 cm) Weight: 84.4 kg (186 lb) IBW/kg (Calculated) : 73  Temp (24hrs), Avg:98.2 F (36.8 C), Min:98.2 F (36.8 C), Max:98.2 F (36.8 C)  Recent Labs  Lab 10/08/20 1654 10/08/20 1854  WBC 6.0  --   CREATININE 1.35*  --   LATICACIDVEN 8.8* 4.7*    Estimated Creatinine Clearance: 75.9 mL/min (A) (by C-G formula based on SCr of 1.35 mg/dL (H)).    Allergies  Allergen Reactions  . Bactrim [Sulfamethoxazole-Trimethoprim] Rash     Thank you for allowing pharmacy to be a part of this patient's care.  Kara Mead 10/08/2020 9:02 PM

## 2020-10-08 NOTE — ED Triage Notes (Signed)
Patient reports that he has had intermittent chest pain, weakness, LUQ abdominal pain and a productive cough with yellow sputum. Patient reports a history of CHF.

## 2020-10-08 NOTE — ED Notes (Signed)
Pt spitting in trash can. Pt states he "feels like he is going to pass out."

## 2020-10-08 NOTE — H&P (Signed)
History and Physical    Robert [name] QIO:[id number] DOB: 17-Apr-1981 DOA: 10/08/2020  PCP: Flossie Buffy, NP Patient coming from: Home  Chief Complaint: Multiple complaints  HPI: Robert [name] is a 40 y.o. male with medical history significant of chronic systolic CHF/nonischemic cardiomyopathy (EF 30% on echo done February 2022), left ventricular noncompaction, history of PE, asthma, gastritis, mood disorder, polysubstance abuse (alcohol, cocaine, marijuana, tobacco), hospital admission last month for severe alcohol withdrawal/delirium tremens.  He presents to the ED today with complaints of left-sided chest pain/left upper quadrant abdominal pain, shortness of breath, cough, nausea, and vomiting.  In the ED, patient was tachycardic and tachypneic.  Afebrile.  Not hypotensive.  Not hypoxic.  Labs showing WBC 6.0, hemoglobin 13.1, platelet count 215K.  Sodium 142, potassium 3.7, chloride 100, bicarb 18, anion gap 24, BUN 14, creatinine 1.3 (baseline 1.0), glucose 114.  AST 99, ALT 74 (chronically elevated).  Alk phos and T bili normal.  Lipase normal.  UA and UDS pending.  Blood ethanol level 180.  High sensitive troponin negative x2.  EKG without acute ischemic changes.  BNP 202.  COVID and influenza PCR negative.  D-dimer 1.0.  Lactic acid 8.8.  VBG showing pH 7.36.  Blood culture x2 pending.  Chest x-ray showing no acute cardiopulmonary process.  CT angiogram chest negative for PE.  Showing a pneumatocele of the inferior left lower lobe with layering frothy fluid suspicious for superimposed infection.  CT abdomen pelvis showing profound hepatic steatosis but no acute abnormality.  Antibiotics ordered and patient was 30 cc/kg fluid boluses per sepsis protocol.  He was started on CIWA protocol.  Lactate improved to 4.7 after fluid boluses.  Patient reports not feeling well for the past few days.  He is feeling weak, coughing up clear sputum, and experiencing left-sided lower chest/upper  abdominal pain.  He has been vomiting for the past 2 days and has not been able to eat or consume any alcohol.  He normally drinks several airplane bottles of vodka daily.  He is having heartburn.  No additional history could be obtained from him.  Review of Systems:  All systems reviewed and apart from history of presenting illness, are negative.  Past Medical History:  Diagnosis Date  . Alcohol use   . Alcohol withdrawal ([place]) 03/06/2020  . Asthma   . CHF (congestive heart failure) ([place])     Past Surgical History:  Procedure Laterality Date  . ESOPHAGOGASTRODUODENOSCOPY (EGD) WITH PROPOFOL N/A 11/01/2019   Procedure: ESOPHAGOGASTRODUODENOSCOPY (EGD) WITH PROPOFOL;  Surgeon: [name], MD;  Location: [facility] ENDOSCOPY;  Service: Endoscopy;  Laterality: N/A;  . RIGHT/LEFT HEART CATH AND CORONARY ANGIOGRAPHY N/A 10/06/2019   Procedure: RIGHT/LEFT HEART CATH AND CORONARY ANGIOGRAPHY;  Surgeon: [name], MD;  Location: [place] [facility];  Service: Cardiovascular;  Laterality: N/A;  . WRIST SURGERY       reports that he has been smoking cigarettes. He has a 6.00 pack-year smoking history. He has never used smokeless tobacco. He reports current alcohol use of about 8.0 standard drinks of alcohol per week. He reports current drug use. Frequency: 1.00 time per week. Drug: Marijuana.  Allergies  Allergen Reactions  . Bactrim [Sulfamethoxazole-Trimethoprim] Rash    Family History  Problem Relation Age of Onset  . Heart failure Father   . Diabetes Father   . Kidney disease Father   . Heart disease Father 18  . Heart disease Maternal Grandfather 46  . Heart disease Paternal Grandfather  89    Prior to Admission medications   Medication Sig Start Date End Date Taking? Authorizing Provider  carvedilol (COREG) 3.125 MG tablet Take 1 tablet (3.125 mg total) by mouth 2 (two) times daily. 04/11/20 04/11/21 Yes Clegg, Amy D, NP  digoxin (LANOXIN) 0.125 MG tablet Take 1 tablet (0.125 mg  total) by mouth daily. 06/14/20  Yes Simmons, Brittainy M, PA-C  FARXIGA 10 MG TABS tablet TAKE 1 TABLET BY MOUTH ONCE DAILY BEFORE BREAKFAST Patient taking differently: Take 10 mg by mouth daily. 09/07/20  Yes Bensimhon, Shaune Pascal, MD  folic acid (FOLVITE) 1 MG tablet Take 1 tablet (1 mg total) by mouth daily. 03/16/20  Yes Bensimhon, Shaune Pascal, MD  ivabradine (CORLANOR) 7.5 MG TABS tablet Take 1 tablet (7.5 mg total) by mouth 2 (two) times daily with a meal. 03/27/20  Yes Bensimhon, Shaune Pascal, MD  LORazepam (ATIVAN) 1 MG tablet Take 1 tablet (1 mg total) by mouth every 6 (six) hours as needed for anxiety. 08/20/20  Yes Arrien, Jimmy Picket, MD  magnesium oxide (MAG-OX) 400 MG tablet Take 400 mg by mouth daily.   Yes [provider]  ondansetron (ZOFRAN ODT) 4 MG disintegrating tablet Take 1 tablet (4 mg total) by mouth every 8 (eight) hours as needed for nausea or vomiting. 08/20/20  Yes Arrien, Jimmy Picket, MD  pantoprazole (PROTONIX) 40 MG tablet Take 1 tablet (40 mg total) by mouth daily. 08/20/20 09/19/20 Yes Arrien, Jimmy Picket, MD  potassium chloride (KLOR-CON) 20 MEQ packet Take 20 mEq by mouth daily.   Yes [provider]  QUEtiapine (SEROQUEL) 100 MG tablet Take 1.5 tablets (150 mg total) by mouth daily. 06/23/20  Yes Nche, Charlene Brooke, NP  sacubitril-valsartan (ENTRESTO) 97-103 MG Take 1 tablet by mouth 2 (two) times daily. 06/16/20  Yes Lyda Jester M, PA-C  sertraline (ZOLOFT) 25 MG tablet Take 25 mg by mouth daily as needed (anxiety).   Yes [provider]  spironolactone (ALDACTONE) 25 MG tablet Take 1 tablet (25 mg total) by mouth daily. HOLD until 03/26/20 Patient taking differently: Take 25 mg by mouth daily. 03/24/20  Yes Oretha Milch D, MD  sucralfate (CARAFATE) 1 g tablet Take 1 tablet (1 g total) by mouth 4 (four) times daily -  with meals and at bedtime. 07/31/20  Yes Bonnielee Haff, MD  thiamine 100 MG tablet Take 1 tablet (100 mg total) by  mouth daily. 07/31/20  Yes Bonnielee Haff, MD    Physical Exam: Vitals:   10/08/20 1745 10/08/20 1800 10/08/20 1815 10/08/20 1920  BP: 129/89 (!) 116/98 134/87 (!) 148/89  Pulse: (!) 112 (!) 112 (!) 120 (!) 131  Resp: 11 (!) '22 19 20  ' Temp:      TempSrc:      SpO2: 97% 94% 94% 100%  Weight:      Height:        Physical Exam Constitutional:      General: He is not in acute distress.    Comments: Tremulous  HENT:     Head: Normocephalic and atraumatic.  Eyes:     Extraocular Movements: Extraocular movements intact.     Conjunctiva/sclera: Conjunctivae normal.  Cardiovascular:     Rate and Rhythm: Regular rhythm. Tachycardia present.     Pulses: Normal pulses.     Comments: Mildly tachycardic Pulmonary:     Effort: Pulmonary effort is normal. No respiratory distress.     Breath sounds: No wheezing or rales.  Abdominal:  General: Bowel sounds are normal. There is no distension.     Palpations: Abdomen is soft.     Tenderness: There is no abdominal tenderness. There is no guarding or rebound.  Musculoskeletal:        General: No swelling or tenderness.     Cervical back: Normal range of motion and neck supple.  Skin:    General: Skin is warm and dry.  Neurological:     General: No focal deficit present.     Mental Status: He is alert and oriented to person, place, and time.     Labs on Admission: I have personally reviewed following labs and imaging studies  CBC: Recent Labs  Lab 10/08/20 1654  WBC 6.0  NEUTROABS 4.4  HGB 13.1  HCT 39.3  MCV 88.9  PLT 557   Basic Metabolic Panel: Recent Labs  Lab 10/08/20 1654  NA 142  K 3.7  CL 100  CO2 18*  GLUCOSE 114*  BUN 14  CREATININE 1.35*  CALCIUM 9.3   GFR: Estimated Creatinine Clearance: 75.9 mL/min (A) (by C-G formula based on SCr of 1.35 mg/dL (H)). Liver Function Tests: Recent Labs  Lab 10/08/20 1654  AST 99*  ALT 74*  ALKPHOS 55  BILITOT 0.7  PROT 8.5*  ALBUMIN 4.9   Recent Labs  Lab  10/08/20 1654  LIPASE 25   No results for input(s): AMMONIA in the last 168 hours. Coagulation Profile: No results for input(s): INR, PROTIME in the last 168 hours. Cardiac Enzymes: No results for input(s): CKTOTAL, CKMB, CKMBINDEX, TROPONINI in the last 168 hours. BNP (last 3 results) No results for input(s): PROBNP in the last 8760 hours. HbA1C: No results for input(s): HGBA1C in the last 72 hours. CBG: No results for input(s): GLUCAP in the last 168 hours. Lipid Profile: No results for input(s): CHOL, HDL, LDLCALC, TRIG, CHOLHDL, LDLDIRECT in the last 72 hours. Thyroid Function Tests: No results for input(s): TSH, T4TOTAL, FREET4, T3FREE, THYROIDAB in the last 72 hours. Anemia Panel: No results for input(s): VITAMINB12, FOLATE, FERRITIN, TIBC, IRON, RETICCTPCT in the last 72 hours. Urine analysis:    Component Value Date/Time   COLORURINE YELLOW 07/28/2020 0005   APPEARANCEUR CLEAR 07/28/2020 0005   LABSPEC 1.023 07/28/2020 0005   PHURINE 5.0 07/28/2020 0005   GLUCOSEU >=500 (A) 07/28/2020 0005   HGBUR SMALL (A) 07/28/2020 0005   BILIRUBINUR NEGATIVE 07/28/2020 0005   KETONESUR 20 (A) 07/28/2020 0005   PROTEINUR 100 (A) 07/28/2020 0005   NITRITE NEGATIVE 07/28/2020 0005   LEUKOCYTESUR NEGATIVE 07/28/2020 0005    Radiological Exams on Admission: CT Angio Chest PE W/Cm &/Or Wo Cm  Result Date: 10/08/2020 CLINICAL DATA:  Shortness of breath, abdominal pain EXAM: CT ANGIOGRAPHY CHEST CT ABDOMEN AND PELVIS WITH CONTRAST TECHNIQUE: Multidetector CT imaging of the chest was performed using the standard protocol during bolus administration of intravenous contrast. Multiplanar CT image reconstructions and MIPs were obtained to evaluate the vascular anatomy. Multidetector CT imaging of the abdomen and pelvis was performed using the standard protocol during bolus administration of intravenous contrast. CONTRAST:  139m OMNIPAQUE IOHEXOL 350 MG/ML SOLN COMPARISON:  Oct 04, 2019  FINDINGS: CTA CHEST FINDINGS Cardiovascular: Evaluation of the lung bases is limited secondary to respiratory motion. No evidence of acute pulmonary embolism through the segmental pulmonary arteries. Evaluation of the distal arteries is limited secondary to motion. Mild cardiomegaly. No pericardial effusion. Mediastinum/Nodes: Thyroid is unremarkable. No axillary or mediastinal adenopathy. Lungs/Pleura: There is a cystic area of the  LEFT lower lobe with internal frothy debris. It measures approximately 19 mm (series 6, image 94). There is an adjacent cystic area superiorly which measures approximately 13 mm. LEFT lower lobe atelectasis. Musculoskeletal: No acute osseous abnormality. Review of the MIP images confirms the above findings. CT ABDOMEN and PELVIS FINDINGS Hepatobiliary: Hepatic steatosis. Gallbladder is unremarkable. No intrahepatic or extrahepatic biliary ductal dilation. Portal vein is patent. Pancreas: Unremarkable. No pancreatic ductal dilatation or surrounding inflammatory changes. Spleen: Normal in size without focal abnormality. Adrenals/Urinary Tract: Adrenal glands are unremarkable. No hydronephrosis. Kidneys enhance symmetrically. Subcentimeter hypodense lesion of the superior pole of the LEFT kidney is too small to accurately characterize. Bladder is unremarkable. Stomach/Bowel: No evidence of bowel obstruction. Appendix is normal. Diffuse submucosal fatty deposition throughout the colon, nonspecific and commonly seen in obesity. Stomach is unremarkable. Vascular/Lymphatic: Mild atherosclerotic calcifications of the aorta. No suspicious lymph nodes. Reproductive: Prostate is unremarkable. Other: No free air or free fluid. Musculoskeletal: No acute or significant osseous findings. Review of the MIP images confirms the above findings. IMPRESSION: 1. No evidence of acute pulmonary embolism. 2. There is a pneumatocele of the inferior LEFT lower lobe with layering frothy fluid. This may reflect  superimposed infection. Recommend correlation with symptomatology. 3. Profound hepatic steatosis. 4. No CT etiology for acute abdominal pain identified. Aortic Atherosclerosis (ICD10-I70.0). Electronically Signed   By: Valentino Saxon MD   On: 10/08/2020 19:02   CT ABDOMEN PELVIS W CONTRAST  Result Date: 10/08/2020 CLINICAL DATA:  Shortness of breath, abdominal pain EXAM: CT ANGIOGRAPHY CHEST CT ABDOMEN AND PELVIS WITH CONTRAST TECHNIQUE: Multidetector CT imaging of the chest was performed using the standard protocol during bolus administration of intravenous contrast. Multiplanar CT image reconstructions and MIPs were obtained to evaluate the vascular anatomy. Multidetector CT imaging of the abdomen and pelvis was performed using the standard protocol during bolus administration of intravenous contrast. CONTRAST:  148m OMNIPAQUE IOHEXOL 350 MG/ML SOLN COMPARISON:  Oct 04, 2019 FINDINGS: CTA CHEST FINDINGS Cardiovascular: Evaluation of the lung bases is limited secondary to respiratory motion. No evidence of acute pulmonary embolism through the segmental pulmonary arteries. Evaluation of the distal arteries is limited secondary to motion. Mild cardiomegaly. No pericardial effusion. Mediastinum/Nodes: Thyroid is unremarkable. No axillary or mediastinal adenopathy. Lungs/Pleura: There is a cystic area of the LEFT lower lobe with internal frothy debris. It measures approximately 19 mm (series 6, image 94). There is an adjacent cystic area superiorly which measures approximately 13 mm. LEFT lower lobe atelectasis. Musculoskeletal: No acute osseous abnormality. Review of the MIP images confirms the above findings. CT ABDOMEN and PELVIS FINDINGS Hepatobiliary: Hepatic steatosis. Gallbladder is unremarkable. No intrahepatic or extrahepatic biliary ductal dilation. Portal vein is patent. Pancreas: Unremarkable. No pancreatic ductal dilatation or surrounding inflammatory changes. Spleen: Normal in size without focal  abnormality. Adrenals/Urinary Tract: Adrenal glands are unremarkable. No hydronephrosis. Kidneys enhance symmetrically. Subcentimeter hypodense lesion of the superior pole of the LEFT kidney is too small to accurately characterize. Bladder is unremarkable. Stomach/Bowel: No evidence of bowel obstruction. Appendix is normal. Diffuse submucosal fatty deposition throughout the colon, nonspecific and commonly seen in obesity. Stomach is unremarkable. Vascular/Lymphatic: Mild atherosclerotic calcifications of the aorta. No suspicious lymph nodes. Reproductive: Prostate is unremarkable. Other: No free air or free fluid. Musculoskeletal: No acute or significant osseous findings. Review of the MIP images confirms the above findings. IMPRESSION: 1. No evidence of acute pulmonary embolism. 2. There is a pneumatocele of the inferior LEFT lower lobe with layering frothy fluid. This may  reflect superimposed infection. Recommend correlation with symptomatology. 3. Profound hepatic steatosis. 4. No CT etiology for acute abdominal pain identified. Aortic Atherosclerosis (ICD10-I70.0). Electronically Signed   By: Valentino Saxon MD   On: 10/08/2020 19:02   DG Chest Port 1 View  Result Date: 10/08/2020 CLINICAL DATA:  40 year old male with chest pain EXAM: PORTABLE CHEST 1 VIEW COMPARISON:  Chest radiograph dated 10/01/2020. FINDINGS: No focal consolidation, pleural effusion, pneumothorax. Mild cardiomegaly. No acute osseous pathology. IMPRESSION: No acute cardiopulmonary process. Electronically Signed   By: Anner Crete M.D.   On: 10/08/2020 18:08    EKG: Independently reviewed.  Sinus rhythm, PVCs, QTC 523.  T wave inversions in leads I, V2, V4-6 similar to prior tracing.  No acute ischemic changes.  Assessment/Plan Principal Problem:   Pneumonia Active Problems:   Chronic systolic CHF (congestive heart failure) ([facility])   AKI (acute kidney injury) ([facility])   Nausea & vomiting   Severe sepsis ([facility])   Severe  sepsis secondary to pneumonia/ infected pneumatocele Meets criteria for severe sepsis with tachycardia, tachypnea, and severe lactic acidosis.  CT showing a pneumatocele of the inferior left lower lobe with layering frothy fluid suspicious for superimposed infection.  COVID and influenza PCR negative.  He is endorsing chest pain, dyspnea, and cough.  Not hypoxic. -Continue vancomycin and cefepime.  Patient was given 30 cc/kg fluid boluses in the ED and lactic acidosis improving.  Not hypotensive.  Does have CHF but no signs of volume overload.  He appears dehydrated, continue maintenance IV fluid.  Blood culture x2 pending.  Continuous pulse ox, supplemental oxygen if needed.  Nausea, vomiting, abdominal pain Transaminases chronically elevated in the setting of alcohol abuse.  Alk phos and T bili normal.  Lipase normal.  CT without acute findings.  COVID and influenza PCR negative.  He does have a history of gastritis which could be contributing. -IV Protonix, continue sucralfate.  Avoid QT prolonging antiemetics.  Mild AKI BUN 14, creatinine 1.3 (baseline 1.0).  Likely prerenal from dehydration/severe sepsis. -IV fluid hydration and monitor renal function.  Avoid nephrotoxic agents.  High anion gap metabolic acidosis Likely multifactorial from significant lactic acidosis and chronic alcohol use. -IV fluid hydration, continue to monitor  Chronic systolic CHF Left ventricular noncompaction Nonischemic cardiomyopathy.  EF 30% on echo done in February 2022.  No significant elevation of BNP or signs of volume overload. -Continue home Coreg, digoxin, ivabradine, and [place]. Hold Entresto and spironolactone given mild AKI, repeat BMP in a.m.  Alcohol abuse Currently tremulous and slightly tachycardic.  Admitted for severe withdrawal/delirium tremens last month. -Progressive care CIWA protocol; Ativan as needed.  Thiamine, folate, and multivitamin.  Monitor mag and Phos levels.  Elevated  D-dimer Likely due to infection/severe sepsis.  CT angiogram negative for PE.  No clinical signs of DVT. -Repeat labs in the morning to check D-dimer level.  If not improving, order Dopplers to rule out DVT.  Tobacco use -NicoDerm patch and counseling  Asthma Stable.  No signs of acute exacerbation. -Albuterol nebulizer as needed  Mood disorder -Continue Seroquel  QT prolongation -Cardiac monitoring.  Monitor potassium and magnesium levels.  Avoid QT prolonging drugs if possible.  Repeat EKG in a.m.  DVT prophylaxis: Lovenox Code Status: Full code Family Communication: No family at bedside. Disposition Plan: Status is: Inpatient  Remains inpatient appropriate because:Inpatient level of care appropriate due to severity of illness   Dispo: The patient is from: Home  Anticipated d/c is to: Home              Patient currently is not medically stable to d/c.   Difficult to place patient No  Level of care: Level of care: Progressive   The medical decision making on this patient was of high complexity and the patient is at high risk for clinical deterioration, therefore this is a level 3 visit.  Shela Leff MD [facility]  If 7PM-7AM, please contact night-coverage [email or web address]  10/08/2020, 8:42 PM

## 2020-10-09 ENCOUNTER — Inpatient Hospital Stay (HOSPITAL_COMMUNITY): Payer: BC Managed Care – PPO

## 2020-10-09 LAB — HIV ANTIBODY (ROUTINE TESTING W REFLEX): HIV Screen 4th Generation wRfx: NONREACTIVE

## 2020-10-09 LAB — BASIC METABOLIC PANEL
Anion gap: 7 (ref 5–15)
BUN: 12 mg/dL (ref 6–20)
CO2: 26 mmol/L (ref 22–32)
Calcium: 8.3 mg/dL — ABNORMAL LOW (ref 8.9–10.3)
Chloride: 104 mmol/L (ref 98–111)
Creatinine, Ser: 1.05 mg/dL (ref 0.61–1.24)
GFR, Estimated: >60 mL/min (ref >60)
Glucose, Bld: 137 mg/dL — ABNORMAL HIGH (ref 70–99)
Potassium: 3.7 mmol/L (ref 3.5–5.1)
Sodium: 137 mmol/L (ref 135–145)

## 2020-10-09 LAB — LACTIC ACID, PLASMA: Lactic Acid, Venous: 1.6 mmol/L (ref 0.5–1.9)

## 2020-10-09 LAB — D-DIMER, QUANTITATIVE: D-Dimer, Quant: 0.79 ug/mL-FEU — ABNORMAL HIGH (ref 0.00–0.50)

## 2020-10-09 LAB — PHOSPHORUS: Phosphorus: 3 mg/dL (ref 2.5–4.6)

## 2020-10-09 LAB — MAGNESIUM: Magnesium: 1 mg/dL — ABNORMAL LOW (ref 1.7–2.4)

## 2020-10-09 LAB — MRSA PCR SCREENING: MRSA by PCR: NEGATIVE

## 2020-10-09 MED ORDER — NICOTINE 21 MG/24HR TD PT24: 21.0000 mg | MEDICATED_PATCH | Freq: Every day | TRANSDERMAL | Status: DC

## 2020-10-09 MED ORDER — VANCOMYCIN HCL 1250 MG/250ML IV SOLN
1250.0000 mg | Freq: Two times a day (BID) | INTRAVENOUS | Status: DC
  Administered 2020-10-09 (×2): 1250 mg via INTRAVENOUS
  Filled 2020-10-09 (×3): qty 250

## 2020-10-09 MED ORDER — MAGNESIUM SULFATE 2 GM/50ML IV SOLN
2.0000 g | Freq: Once | INTRAVENOUS | Status: AC
  Administered 2020-10-09: 2 g via INTRAVENOUS
  Filled 2020-10-09: qty 50

## 2020-10-09 MED ORDER — SODIUM CHLORIDE 0.9 % IV SOLN
3.0000 g | Freq: Four times a day (QID) | INTRAVENOUS | Status: DC
  Administered 2020-10-09 – 2020-10-12 (×12): 3 g via INTRAVENOUS
  Filled 2020-10-09 (×3): qty 3
  Filled 2020-10-09 (×3): qty 8
  Filled 2020-10-09: qty 3
  Filled 2020-10-09 (×7): qty 8

## 2020-10-09 NOTE — Progress Notes (Addendum)
PROGRESS NOTE    Robert [name]  YOV:[id number] DOB: 07/05/1980 DOA: 10/08/2020 PCP: Flossie Buffy, NP   Brief Narrative:  HPI: Robert [name] is a 40 y.o. male with medical history significant of chronic systolic CHF/nonischemic cardiomyopathy (EF 30% on echo done February 2022), left ventricular noncompaction, history of PE, asthma, gastritis, mood disorder, polysubstance abuse (alcohol, cocaine, marijuana, tobacco), hospital admission last month for severe alcohol withdrawal/delirium tremens.  He presents to the ED today with complaints of left-sided chest pain/left upper quadrant abdominal pain, shortness of breath, cough, nausea, and vomiting.  In the ED, patient was tachycardic and tachypneic.  Afebrile.  Not hypotensive.  Not hypoxic.  Labs showing WBC 6.0, hemoglobin 13.1, platelet count 215K.  Sodium 142, potassium 3.7, chloride 100, bicarb 18, anion gap 24, BUN 14, creatinine 1.3 (baseline 1.0), glucose 114.  AST 99, ALT 74 (chronically elevated).  Alk phos and T bili normal.  Lipase normal.  UA and UDS pending.  Blood ethanol level 180.  High sensitive troponin negative x2.  EKG without acute ischemic changes.  BNP 202.  COVID and influenza PCR negative.  D-dimer 1.0.  Lactic acid 8.8.  VBG showing pH 7.36.  Blood culture x2 pending.  Chest x-ray showing no acute cardiopulmonary process.  CT angiogram chest negative for PE.  Showing a pneumatocele of the inferior left lower lobe with layering frothy fluid suspicious for superimposed infection.  CT abdomen pelvis showing profound hepatic steatosis but no acute abnormality.  Antibiotics ordered and patient was 30 cc/kg fluid boluses per sepsis protocol.  He was started on CIWA protocol.  Lactate improved to 4.7 after fluid boluses.  Patient reports not feeling well for the past few days.  He is feeling weak, coughing up clear sputum, and experiencing left-sided lower chest/upper abdominal pain.  He has been vomiting for the past 2  days and has not been able to eat or consume any alcohol.  He normally drinks several airplane bottles of vodka daily.  He is having heartburn.  No additional history could be obtained from him.  Assessment & Plan:   Principal Problem:   Pneumonia Active Problems:   Chronic systolic CHF (congestive heart failure) (HCC)   AKI (acute kidney injury) (HCC)   Nausea & vomiting   Severe sepsis (HCC)  Severe sepsis secondary to possible aspiration pneumonia/ infected pneumatocele: Met criteria for severe sepsis with tachycardia, tachypnea, and severe lactic acidosis.  COVID and influenza PCR negative.  Not hypoxic. -Continue vancomycin and cefepime.  Patient was given 30 cc/kg fluid boluses in the ED and lactic acidosis improving.  Not hypotensive.  No leukocytosis.  Afebrile.  Location of the pneumonia in combination of possible dental infection raises suspicion for possible aspiration pneumonia.  We will switch from cefepime to Unasyn and continue vancomycin and follow cultures.  We will also check urine antigen for Legionella and Streptococcus.  Lactic acidosis resolved.  Possible infected tooth: Patient unable to open his mouth fully and because of that, his speech is also slightly dysarthric.  He tells me that he is having pain in the left shoulder and he has tooth infection and has seen his dentist with referred him to maxillofacial surgeon who wanted to do some procedure on him but he could not afford to pay for that and thus forego it.  I am concerned about possible abscess.  Will order CT maxillofacial.  Hypomagnesemia: We will replace.  Recheck in the morning.  Nausea, vomiting, abdominal pain Transaminases chronically elevated  in the setting of alcohol abuse.  Alk phos and T bili normal.  Lipase normal.  CT without acute findings. He does have a history of gastritis which could be contributing.  Symptoms improved.  Continue IV Protonix, continue sucralfate.  Avoid QT prolonging  antiemetics.  Mild AKI: Resolved.  High anion gap metabolic acidosis: Resolved.  Chronic systolic CHF/nonischemic cardiomyopathy: EF 30% on echo done in February 2022.  No significant elevation of BNP or signs of volume overload. -Continue home Coreg, digoxin, ivabradine, and [place]. Hold Entresto and spironolactone given mild AKI and likely resume tomorrow.  Repeat labs in the morning.  Alcohol abuse/acute alcohol withdrawal: Alcohol level 188 presentation.  CIWA early morning was as high as 12.  He was not having any withdrawal symptoms during my ablation.  Continue CIWA protocol with as needed Ativan.  Elevated D-dimer Likely due to infection/severe sepsis.  CT angiogram negative for PE.  No clinical signs of DVT.  Tobacco use -NicoDerm patch and counseling  Asthma Stable.  No signs of acute exacerbation. -Albuterol nebulizer as needed  Mood disorder -Continue Seroquel  QT prolongation -Cardiac monitoring.  Monitor potassium and magnesium levels.  Avoid QT prolonging drugs if possible.  Repeat EKG in a.m.  DVT prophylaxis: enoxaparin (LOVENOX) injection 40 mg Start: 10/08/20 2200   Code Status: Full Code  Family Communication:  None present at bedside.  Plan of care discussed with patient in length and he verbalized understanding and agreed with it.  Status is: Inpatient  Remains inpatient appropriate because:Inpatient level of care appropriate due to severity of illness   Dispo: The patient is from: Home              Anticipated d/c is to: Home              Patient currently is not medically stable to d/c.   Difficult to place patient No        Estimated body mass index is 26.69 kg/m as calculated from the following:   Height as of this encounter: $RemoveBeforeD'5\' 10"'[id number]$  (1.778 m).   Weight as of this encounter: 84.4 kg.      Nutritional status:               Consultants:   None  Procedures:   None  Antimicrobials:  Anti-infectives (From  admission, onward)   Start     Dose/Rate Route Frequency Ordered Stop   10/09/20 1000  vancomycin (VANCOCIN) IVPB 1000 mg/200 mL premix  Status:  Discontinued        1,000 mg 200 mL/hr over 60 Minutes Intravenous Every 12 hours 10/08/20 2105 10/09/20 0902   10/09/20 1000  vancomycin (VANCOREADY) IVPB 1250 mg/250 mL        1,250 mg 166.7 mL/hr over 90 Minutes Intravenous Every 12 hours 10/09/20 0902     10/09/20 0600  ceFEPIme (MAXIPIME) 2 g in sodium chloride 0.9 % 100 mL IVPB        2 g 200 mL/hr over 30 Minutes Intravenous Every 8 hours 10/08/20 2105     10/08/20 2115  vancomycin (VANCOREADY) IVPB 1750 mg/350 mL        1,750 mg 175 mL/hr over 120 Minutes Intravenous  Once 10/08/20 2101 10/08/20 2359   10/08/20 1930  azithromycin (ZITHROMAX) 500 mg in sodium chloride 0.9 % 250 mL IVPB  Status:  Discontinued        500 mg 250 mL/hr over 60 Minutes Intravenous  Once 10/08/20 1926 10/08/20 2056  10/08/20 1930  vancomycin (VANCOCIN) IVPB 1000 mg/200 mL premix  Status:  Discontinued        1,000 mg 200 mL/hr over 60 Minutes Intravenous  Once 10/08/20 1926 10/08/20 2101   10/08/20 1930  ceFEPIme (MAXIPIME) 2 g in sodium chloride 0.9 % 100 mL IVPB        2 g 200 mL/hr over 30 Minutes Intravenous  Once 10/08/20 1926 10/08/20 2128         Subjective: Seen and examined.  He states that he feels better.  Shortness of breath is improving.  Complains of left jaw and tooth pain.  Objective: Vitals:   10/08/20 2151 10/09/20 0038 10/09/20 0345 10/09/20 0813  BP: 138/86 (!) 138/92 (!) 151/99 (!) 135/98  Pulse: 98 (!) 109 95 97  Resp: (!) $RemoveB'22 20 19 18  'tOoSutdl$ Temp: 100.1 F (37.8 C) 98.6 F (37 C) 98.5 F (36.9 C) 98.1 F (36.7 C)  TempSrc: Oral Oral    SpO2: 100% 100% 100% 100%  Weight:      Height:        Intake/Output Summary (Last 24 hours) at 10/09/2020 1410 Last data filed at 10/09/2020 0600 Gross per 24 hour  Intake 4354.24 ml  Output 450 ml  Net 3904.24 ml   Filed Weights    10/08/20 1618  Weight: 84.4 kg    Examination:  General exam: Appears calm and comfortable, unable to open his jaw fully and this causes dysarthric speech. Respiratory system: Diminished breath sounds at the bases bilaterally, no rhonchi or crackles or wheezes. Respiratory effort normal. Cardiovascular system: S1 & S2 heard, RRR. No JVD, murmurs, rubs, gallops or clicks. No pedal edema. Gastrointestinal system: Abdomen is nondistended, soft and nontender. No organomegaly or masses felt. Normal bowel sounds heard. Central nervous system: Alert and oriented. No focal neurological deficits. Extremities: Symmetric 5 x 5 power. Skin: No rashes, lesions or ulcers Psychiatry: Judgement and insight appear normal. Mood & affect appropriate.    Data Reviewed: I have personally reviewed following labs and imaging studies  CBC: Recent Labs  Lab 10/08/20 1654  WBC 6.0  NEUTROABS 4.4  HGB 13.1  HCT 39.3  MCV 88.9  PLT 324   Basic Metabolic Panel: Recent Labs  Lab 10/08/20 1654 10/08/20 1819 10/09/20 0535 10/09/20 0908  NA 142  --  137  --   K 3.7  --  3.7  --   CL 100  --  104  --   CO2 18*  --  26  --   GLUCOSE 114*  --  137*  --   BUN 14  --  12  --   CREATININE 1.35*  --  1.05  --   CALCIUM 9.3  --  8.3*  --   MG  --  1.4*  --  1.0*  PHOS  --   --  3.0  --    GFR: Estimated Creatinine Clearance: 97.5 mL/min (by C-G formula based on SCr of 1.05 mg/dL). Liver Function Tests: Recent Labs  Lab 10/08/20 1654  AST 99*  ALT 74*  ALKPHOS 55  BILITOT 0.7  PROT 8.5*  ALBUMIN 4.9   Recent Labs  Lab 10/08/20 1654  LIPASE 25   No results for input(s): AMMONIA in the last 168 hours. Coagulation Profile: No results for input(s): INR, PROTIME in the last 168 hours. Cardiac Enzymes: No results for input(s): CKTOTAL, CKMB, CKMBINDEX, TROPONINI in the last 168 hours. BNP (last 3 results) No results for input(s): PROBNP in  the last 8760 hours. HbA1C: No results for input(s):  HGBA1C in the last 72 hours. CBG: No results for input(s): GLUCAP in the last 168 hours. Lipid Profile: No results for input(s): CHOL, HDL, LDLCALC, TRIG, CHOLHDL, LDLDIRECT in the last 72 hours. Thyroid Function Tests: No results for input(s): TSH, T4TOTAL, FREET4, T3FREE, THYROIDAB in the last 72 hours. Anemia Panel: No results for input(s): VITAMINB12, FOLATE, FERRITIN, TIBC, IRON, RETICCTPCT in the last 72 hours. Sepsis Labs: Recent Labs  Lab 10/08/20 1654 10/08/20 1854 10/09/20 0908  LATICACIDVEN 8.8* 4.7* 1.6    Recent Results (from the past 240 hour(s))  Resp Panel by RT-PCR (Flu A&B, Covid) Nasopharyngeal Swab     Status: None   Collection Time: 10/08/20  4:54 PM   Specimen: Nasopharyngeal Swab; Nasopharyngeal(NP) swabs in vial transport medium  Result Value Ref Range Status   SARS Coronavirus 2 by RT PCR NEGATIVE NEGATIVE Final    Comment: (NOTE) SARS-CoV-2 target nucleic acids are NOT DETECTED.  The SARS-CoV-2 RNA is generally detectable in upper respiratory specimens during the acute phase of infection. The lowest concentration of SARS-CoV-2 viral copies this assay can detect is 138 copies/mL. A negative result does not preclude SARS-Cov-2 infection and should not be used as the sole basis for treatment or other patient management decisions. A negative result may occur with  improper specimen collection/handling, submission of specimen other than nasopharyngeal swab, presence of viral mutation(s) within the areas targeted by this assay, and inadequate number of viral copies(<138 copies/mL). A negative result must be combined with clinical observations, patient history, and epidemiological information. The expected result is Negative.  Fact Sheet for Patients:  EntrepreneurPulse.com.au  Fact Sheet for Healthcare Providers:  IncredibleEmployment.be  This test is no t yet approved or cleared by the [place] FDA and  has  been authorized for detection and/or diagnosis of SARS-CoV-2 by FDA under an Emergency Use Authorization (EUA). This EUA will remain  in effect (meaning this test can be used) for the duration of the COVID-19 declaration under Section 564(b)(1) of the Act, 21 U.S.C.section 360bbb-3(b)(1), unless the authorization is terminated  or revoked sooner.       Influenza A by PCR NEGATIVE NEGATIVE Final   Influenza B by PCR NEGATIVE NEGATIVE Final    Comment: (NOTE) The Xpert Xpress SARS-CoV-2/FLU/RSV plus assay is intended as an aid in the diagnosis of influenza from Nasopharyngeal swab specimens and should not be used as a sole basis for treatment. Nasal washings and aspirates are unacceptable for Xpert Xpress SARS-CoV-2/FLU/RSV testing.  Fact Sheet for Patients: EntrepreneurPulse.com.au  Fact Sheet for Healthcare Providers: IncredibleEmployment.be  This test is not yet approved or cleared by the [place] FDA and has been authorized for detection and/or diagnosis of SARS-CoV-2 by FDA under an Emergency Use Authorization (EUA). This EUA will remain in effect (meaning this test can be used) for the duration of the COVID-19 declaration under Section 564(b)(1) of the Act, 21 U.S.C. section 360bbb-3(b)(1), unless the authorization is terminated or revoked.  Performed at [facility], [place] [address], [place], [place] [address]   MRSA PCR Screening     Status: None   Collection Time: 10/09/20 12:38 PM   Specimen: Nasopharyngeal  Result Value Ref Range Status   MRSA by PCR NEGATIVE NEGATIVE Final    Comment:        The GeneXpert MRSA Assay (FDA approved for NASAL specimens only), is one component of a comprehensive MRSA colonization surveillance program. It is not  intended to diagnose MRSA infection nor to guide or monitor treatment for MRSA infections. Performed at [facility], [place] [address],  [place], [place] [address]       Radiology Studies: CT Angio Chest PE W/Cm &/Or Wo Cm  Result Date: 10/08/2020 CLINICAL DATA:  Shortness of breath, abdominal pain EXAM: CT ANGIOGRAPHY CHEST CT ABDOMEN AND PELVIS WITH CONTRAST TECHNIQUE: Multidetector CT imaging of the chest was performed using the standard protocol during bolus administration of intravenous contrast. Multiplanar CT image reconstructions and MIPs were obtained to evaluate the vascular anatomy. Multidetector CT imaging of the abdomen and pelvis was performed using the standard protocol during bolus administration of intravenous contrast. CONTRAST:  163mL OMNIPAQUE IOHEXOL 350 MG/ML SOLN COMPARISON:  Oct 04, 2019 FINDINGS: CTA CHEST FINDINGS Cardiovascular: Evaluation of the lung bases is limited secondary to respiratory motion. No evidence of acute pulmonary embolism through the segmental pulmonary arteries. Evaluation of the distal arteries is limited secondary to motion. Mild cardiomegaly. No pericardial effusion. Mediastinum/Nodes: Thyroid is unremarkable. No axillary or mediastinal adenopathy. Lungs/Pleura: There is a cystic area of the LEFT lower lobe with internal frothy debris. It measures approximately 19 mm (series 6, image 94). There is an adjacent cystic area superiorly which measures approximately 13 mm. LEFT lower lobe atelectasis. Musculoskeletal: No acute osseous abnormality. Review of the MIP images confirms the above findings. CT ABDOMEN and PELVIS FINDINGS Hepatobiliary: Hepatic steatosis. Gallbladder is unremarkable. No intrahepatic or extrahepatic biliary ductal dilation. Portal vein is patent. Pancreas: Unremarkable. No pancreatic ductal dilatation or surrounding inflammatory changes. Spleen: Normal in size without focal abnormality. Adrenals/Urinary Tract: Adrenal glands are unremarkable. No hydronephrosis. Kidneys enhance symmetrically. Subcentimeter hypodense lesion of the superior pole of the LEFT kidney is too small to  accurately characterize. Bladder is unremarkable. Stomach/Bowel: No evidence of bowel obstruction. Appendix is normal. Diffuse submucosal fatty deposition throughout the colon, nonspecific and commonly seen in obesity. Stomach is unremarkable. Vascular/Lymphatic: Mild atherosclerotic calcifications of the aorta. No suspicious lymph nodes. Reproductive: Prostate is unremarkable. Other: No free air or free fluid. Musculoskeletal: No acute or significant osseous findings. Review of the MIP images confirms the above findings. IMPRESSION: 1. No evidence of acute pulmonary embolism. 2. There is a pneumatocele of the inferior LEFT lower lobe with layering frothy fluid. This may reflect superimposed infection. Recommend correlation with symptomatology. 3. Profound hepatic steatosis. 4. No CT etiology for acute abdominal pain identified. Aortic Atherosclerosis (ICD10-I70.0). Electronically Signed   By: Valentino Saxon MD   On: 10/08/2020 19:02   CT ABDOMEN PELVIS W CONTRAST  Result Date: 10/08/2020 CLINICAL DATA:  Shortness of breath, abdominal pain EXAM: CT ANGIOGRAPHY CHEST CT ABDOMEN AND PELVIS WITH CONTRAST TECHNIQUE: Multidetector CT imaging of the chest was performed using the standard protocol during bolus administration of intravenous contrast. Multiplanar CT image reconstructions and MIPs were obtained to evaluate the vascular anatomy. Multidetector CT imaging of the abdomen and pelvis was performed using the standard protocol during bolus administration of intravenous contrast. CONTRAST:  131mL OMNIPAQUE IOHEXOL 350 MG/ML SOLN COMPARISON:  Oct 04, 2019 FINDINGS: CTA CHEST FINDINGS Cardiovascular: Evaluation of the lung bases is limited secondary to respiratory motion. No evidence of acute pulmonary embolism through the segmental pulmonary arteries. Evaluation of the distal arteries is limited secondary to motion. Mild cardiomegaly. No pericardial effusion. Mediastinum/Nodes: Thyroid is unremarkable. No  axillary or mediastinal adenopathy. Lungs/Pleura: There is a cystic area of the LEFT lower lobe with internal frothy debris. It measures approximately 19 mm (series 6,  image 94). There is an adjacent cystic area superiorly which measures approximately 13 mm. LEFT lower lobe atelectasis. Musculoskeletal: No acute osseous abnormality. Review of the MIP images confirms the above findings. CT ABDOMEN and PELVIS FINDINGS Hepatobiliary: Hepatic steatosis. Gallbladder is unremarkable. No intrahepatic or extrahepatic biliary ductal dilation. Portal vein is patent. Pancreas: Unremarkable. No pancreatic ductal dilatation or surrounding inflammatory changes. Spleen: Normal in size without focal abnormality. Adrenals/Urinary Tract: Adrenal glands are unremarkable. No hydronephrosis. Kidneys enhance symmetrically. Subcentimeter hypodense lesion of the superior pole of the LEFT kidney is too small to accurately characterize. Bladder is unremarkable. Stomach/Bowel: No evidence of bowel obstruction. Appendix is normal. Diffuse submucosal fatty deposition throughout the colon, nonspecific and commonly seen in obesity. Stomach is unremarkable. Vascular/Lymphatic: Mild atherosclerotic calcifications of the aorta. No suspicious lymph nodes. Reproductive: Prostate is unremarkable. Other: No free air or free fluid. Musculoskeletal: No acute or significant osseous findings. Review of the MIP images confirms the above findings. IMPRESSION: 1. No evidence of acute pulmonary embolism. 2. There is a pneumatocele of the inferior LEFT lower lobe with layering frothy fluid. This may reflect superimposed infection. Recommend correlation with symptomatology. 3. Profound hepatic steatosis. 4. No CT etiology for acute abdominal pain identified. Aortic Atherosclerosis (ICD10-I70.0). Electronically Signed   By: Valentino Saxon MD   On: 10/08/2020 19:02   DG Chest Port 1 View  Result Date: 10/08/2020 CLINICAL DATA:  40 year old male with chest  pain EXAM: PORTABLE CHEST 1 VIEW COMPARISON:  Chest radiograph dated 10/01/2020. FINDINGS: No focal consolidation, pleural effusion, pneumothorax. Mild cardiomegaly. No acute osseous pathology. IMPRESSION: No acute cardiopulmonary process. Electronically Signed   By: Anner Crete M.D.   On: 10/08/2020 18:08    Scheduled Meds: . carvedilol  3.125 mg Oral BID  . dapagliflozin propanediol  10 mg Oral Daily  . digoxin  0.125 mg Oral Daily  . enoxaparin (LOVENOX) injection  40 mg Subcutaneous Q24H  . folic acid  1 mg Oral Daily  . ivabradine  7.5 mg Oral BID WC  . magnesium oxide  400 mg Oral Daily  . multivitamin with minerals  1 tablet Oral Daily  . nicotine  7 mg Transdermal Daily  . pantoprazole  40 mg Oral QHS  . QUEtiapine  150 mg Oral Daily  . sucralfate  1 g Oral TID WC & HS  . thiamine  100 mg Oral Daily   Or  . thiamine  100 mg Intravenous Daily   Continuous Infusions: . ceFEPime (MAXIPIME) IV 2 g (10/09/20 0422)  . vancomycin 1,250 mg (10/09/20 1121)     LOS: 1 day   Time spent: 35 minutes   Darliss Cheney, MD [facility]  10/09/2020, 2:10 PM   How to contact the [facility] Attending or Consulting provider [place] or covering provider during after hours [place], for this patient?  1. Check the care team in [facility] and look for a) attending/consulting [facility] provider listed and b) the [facility] team listed. Page or secure chat [phone]. 2. Log into [email or web address] and use [place]'s universal password to access. If you do not have the password, please contact the hospital operator. 3. Locate the [facility] provider you are looking for under [facility] and page to a number that you can be directly reached. 4. If you still have difficulty reaching the provider, please page the [facility] (Director on Call) for the Hospitalists listed on amion for assistance.

## 2020-10-09 NOTE — Progress Notes (Signed)
possibly smoking on unity : contacted [facility] provider on call: nicotine patch increase to 21mg 

## 2020-10-09 NOTE — Progress Notes (Signed)
PHARMACY NOTE -  Unasyn  Pharmacy has been assisting with dosing of Unasyn for aspiration PNA.  Dosage remains stable at 3g IV q6h and further renal adjustments per institutional Pharmacy antibiotic protocol  Pharmacy will sign off, following peripherally for culture results or dose adjustments. Please reconsult if a change in clinical status warrants re-evaluation of dosage.  Reuel Boom, PharmD, BCPS [phone] 10/09/2020, 2:24 PM

## 2020-10-09 NOTE — Evaluation (Signed)
Physical Therapy One Time Evaluation Patient Details Name: Robert [name] MRN: [id number] DOB: 1980-08-08 Today's Date: 10/09/2020   History of Present Illness  40 y.o. male admitted for Severe sepsis secondary to possible aspiration pneumonia/ infected pneumatocele.  Past medical history significant of chronic systolic CHF/nonischemic cardiomyopathy (EF 30% on echo done February 2022), left ventricular noncompaction, history of PE, asthma, gastritis, mood disorder, polysubstance abuse (alcohol, cocaine, marijuana, tobacco), hospital admission last month for severe alcohol withdrawal/delirium tremens  Clinical Impression  Patient evaluated by Physical Therapy with no further acute PT needs identified. All education has been completed and the patient has no further questions.  Pt very flat and making little verbalizations.  Pt ambulated good distance in hallway and occasionally staggering or drifting however reports he "just woke up."  Pt did not require physical assist for mobility.  Recommend nursing staff ambulate with pt during acute stay. See below for any follow-up Physical Therapy or equipment needs. PT is signing off. Thank you for this referral.     Follow Up Recommendations No PT follow up    Equipment Recommendations  None recommended by PT    Recommendations for Other Services       Precautions / Restrictions Precautions Precautions: Fall      Mobility  Bed Mobility Overal bed mobility: Modified Independent                  Transfers Overall transfer level: Needs assistance Equipment used: None Transfers: Sit to/from Stand Sit to Stand: Supervision            Ambulation/Gait Ambulation/Gait assistance: Min guard Gait Distance (Feet): 350 Feet Assistive device: None Gait Pattern/deviations: Drifts right/left;Staggering left;Step-through pattern     General Gait Details: occasional stagger or drift however pt did not require assist and denies  increased dizziness, reports feeling like he "just woke up" as response to stability  Stairs            Wheelchair Mobility    Modified Rankin (Stroke Patients Only)       Balance Overall balance assessment: Mild deficits observed, not formally tested                             High Level Balance Comments: attempted to have pt perform head turns and such however pt just walking             Pertinent Vitals/Pain Pain Assessment: No/denies pain    Home Living Family/patient expects to be discharged to:: Private residence Living Arrangements: Parent             Home Equipment: None Additional Comments: pt not forthcoming with information however per last admission 11 months ago, pt from one level home with a few steps to enter and lives with parent    Prior Function Level of Independence: Independent               Hand Dominance        Extremity/Trunk Assessment   Upper Extremity Assessment Upper Extremity Assessment: Overall WFL for tasks assessed    Lower Extremity Assessment Lower Extremity Assessment: Overall WFL for tasks assessed    Cervical / Trunk Assessment Cervical / Trunk Assessment: Normal  Communication   Communication: No difficulties  Cognition Arousal/Alertness: Awake/alert Behavior During Therapy: Flat affect Overall Cognitive Status: Within Functional Limits for tasks assessed  General Comments: cognition appears intact however pt provided little verbalization      General Comments      Exercises     Assessment/Plan    PT Assessment Patent does not need any further PT services  PT Problem List         PT Treatment Interventions      PT Goals (Current goals can be found in the Care Plan section)  Acute Rehab PT Goals PT Goal Formulation: All assessment and education complete, DC therapy    Frequency     Barriers to discharge         Co-evaluation               AM-PAC PT "6 Clicks" Mobility  Outcome Measure Help needed turning from your back to your side while in a flat bed without using bedrails?: None Help needed moving from lying on your back to sitting on the side of a flat bed without using bedrails?: None Help needed moving to and from a bed to a chair (including a wheelchair)?: None Help needed standing up from a chair using your arms (e.g., wheelchair or bedside chair)?: A Little Help needed to walk in hospital room?: A Little Help needed climbing 3-5 steps with a railing? : A Little 6 Click Score: 21    End of Session Equipment Utilized During Treatment: Gait belt Activity Tolerance: Patient tolerated treatment well Patient left: in bed;with call bell/phone within reach;with family/visitor present Nurse Communication: Mobility status PT Visit Diagnosis: Difficulty in walking, not elsewhere classified (R26.2)    Time: 2774-1287 PT Time Calculation (min) (ACUTE ONLY): 11 min   Charges:   PT Evaluation $PT Eval Low Complexity: 1 Low     Kati PT, DPT Acute Rehabilitation Services Pager: [phone] Office: [phone]  Trena Platt 10/09/2020, 3:47 PM

## 2020-10-09 NOTE — Progress Notes (Signed)
Pharmacy Antibiotic Note  Levan Aloia is a 40 y.o. male admitted on 10/08/2020 with pneumonia. Hospitalized last month. Pharmacy has been consulted for vanc/cefepime dosing.  Plan:  Adjust Vanc to 1250mg  IV q12 - goal AUC 400-550  Cefepime 2g IV q8  Follow up renal function & cultures  Height: 5\' 10"  (177.8 cm) Weight: 84.4 kg (186 lb) IBW/kg (Calculated) : 73  Temp (24hrs), Avg:98.7 F (37.1 C), Min:98.1 F (36.7 C), Max:100.1 F (37.8 C)  Recent Labs  Lab 10/08/20 1654 10/08/20 1854 10/09/20 0535  WBC 6.0  --   --   CREATININE 1.35*  --  1.05  LATICACIDVEN 8.8* 4.7*  --     Estimated Creatinine Clearance: 97.5 mL/min (by C-G formula based on SCr of 1.05 mg/dL).    Allergies  Allergen Reactions  . Bactrim [Sulfamethoxazole-Trimethoprim] Rash   Antimicrobials this admission: 5/29 vanc >> 5/29 cefepime >>  Dose adjustments this admission:  Microbiology results: 5/29 BCx: sent  Thank you for allowing pharmacy to be a part of this patient's care.  Peggyann Juba, PharmD, BCPS Pharmacy: [phone] 10/09/2020 9:13 AM

## 2020-10-10 ENCOUNTER — Other Ambulatory Visit (HOSPITAL_COMMUNITY): Payer: BC Managed Care – PPO

## 2020-10-10 DIAGNOSIS — A419 Sepsis, unspecified organism: Secondary | ICD-10-CM | POA: Diagnosis not present

## 2020-10-10 DIAGNOSIS — I5022 Chronic systolic (congestive) heart failure: Secondary | ICD-10-CM

## 2020-10-10 DIAGNOSIS — J189 Pneumonia, unspecified organism: Secondary | ICD-10-CM | POA: Diagnosis not present

## 2020-10-10 DIAGNOSIS — F10231 Alcohol dependence with withdrawal delirium: Secondary | ICD-10-CM

## 2020-10-10 DIAGNOSIS — N179 Acute kidney failure, unspecified: Secondary | ICD-10-CM | POA: Diagnosis not present

## 2020-10-10 LAB — CBC WITH DIFFERENTIAL/PLATELET
Abs Immature Granulocytes: 0.01 10*3/uL (ref 0.00–0.07)
Basophils Absolute: 0 10*3/uL (ref 0.0–0.1)
Basophils Relative: 1 %
Eosinophils Absolute: 0.2 10*3/uL (ref 0.0–0.5)
Eosinophils Relative: 5 %
HCT: 30.4 % — ABNORMAL LOW (ref 39.0–52.0)
Hemoglobin: 9.9 g/dL — ABNORMAL LOW (ref 13.0–17.0)
Immature Granulocytes: 0 %
Lymphocytes Relative: 25 %
Lymphs Abs: 0.8 10*3/uL (ref 0.7–4.0)
MCH: 29.4 pg (ref 26.0–34.0)
MCHC: 32.6 g/dL (ref 30.0–36.0)
MCV: 90.2 fL (ref 80.0–100.0)
Monocytes Absolute: 0.3 10*3/uL (ref 0.1–1.0)
Monocytes Relative: 9 %
Neutro Abs: 1.8 10*3/uL (ref 1.7–7.7)
Neutrophils Relative %: 60 %
Platelets: 134 10*3/uL — ABNORMAL LOW (ref 150–400)
RBC: 3.37 MIL/uL — ABNORMAL LOW (ref 4.22–5.81)
RDW: 13.8 % (ref 11.5–15.5)
WBC: 3.1 10*3/uL — ABNORMAL LOW (ref 4.0–10.5)
nRBC: 0 % (ref 0.0–0.2)

## 2020-10-10 LAB — COMPREHENSIVE METABOLIC PANEL
ALT: 44 U/L (ref 0–44)
AST: 57 U/L — ABNORMAL HIGH (ref 15–41)
Albumin: 3.6 g/dL (ref 3.5–5.0)
Alkaline Phosphatase: 51 U/L (ref 38–126)
Anion gap: 7 (ref 5–15)
BUN: 9 mg/dL (ref 6–20)
CO2: 24 mmol/L (ref 22–32)
Calcium: 8.5 mg/dL — ABNORMAL LOW (ref 8.9–10.3)
Chloride: 105 mmol/L (ref 98–111)
Creatinine, Ser: 1.23 mg/dL (ref 0.61–1.24)
GFR, Estimated: >60 mL/min (ref >60)
Glucose, Bld: 109 mg/dL — ABNORMAL HIGH (ref 70–99)
Potassium: 3.7 mmol/L (ref 3.5–5.1)
Sodium: 136 mmol/L (ref 135–145)
Total Bilirubin: 0.3 mg/dL (ref 0.3–1.2)
Total Protein: 6.4 g/dL — ABNORMAL LOW (ref 6.5–8.1)

## 2020-10-10 LAB — DIGOXIN LEVEL: Digoxin Level: 0.2 ng/mL — ABNORMAL LOW (ref 0.8–2.0)

## 2020-10-10 LAB — MAGNESIUM: Magnesium: 2.1 mg/dL (ref 1.7–2.4)

## 2020-10-10 LAB — STREP PNEUMONIAE URINARY ANTIGEN: Strep Pneumo Urinary Antigen: NEGATIVE

## 2020-10-10 MED ORDER — HALOPERIDOL LACTATE 5 MG/ML IJ SOLN
INTRAMUSCULAR | Status: AC
  Administered 2020-10-10: 5 mg
  Filled 2020-10-10: qty 1

## 2020-10-10 MED ORDER — OLANZAPINE 5 MG PO TBDP
2.5000 mg | ORAL_TABLET | Freq: Four times a day (QID) | ORAL | Status: DC | PRN
  Administered 2020-10-10: 2.5 mg via ORAL
  Filled 2020-10-10 (×3): qty 0.5

## 2020-10-10 MED ORDER — PANTOPRAZOLE SODIUM 40 MG PO TBEC: 40.0000 mg | DELAYED_RELEASE_TABLET | Freq: Every day | ORAL | Status: DC

## 2020-10-10 MED ORDER — NICOTINE 21 MG/24HR TD PT24
21.0000 mg | MEDICATED_PATCH | Freq: Every day | TRANSDERMAL | Status: DC
  Administered 2020-10-10 – 2020-10-12 (×3): 21 mg via TRANSDERMAL
  Filled 2020-10-10 (×4): qty 1

## 2020-10-10 MED ORDER — DIAZEPAM 5 MG/ML IJ SOLN
10.0000 mg | Freq: Three times a day (TID) | INTRAMUSCULAR | Status: DC | PRN
  Administered 2020-10-11 – 2020-10-13 (×3): 10 mg via INTRAVENOUS
  Filled 2020-10-10 (×3): qty 2

## 2020-10-10 MED ORDER — CHLORDIAZEPOXIDE HCL 25 MG PO CAPS
25.0000 mg | ORAL_CAPSULE | Freq: Three times a day (TID) | ORAL | Status: DC
  Administered 2020-10-10: 25 mg via ORAL
  Filled 2020-10-10: qty 1

## 2020-10-10 MED ORDER — SPIRONOLACTONE 25 MG PO TABS
25.0000 mg | ORAL_TABLET | Freq: Every day | ORAL | Status: DC
  Administered 2020-10-11 – 2020-10-12 (×2): 25 mg via ORAL
  Filled 2020-10-10 (×3): qty 1

## 2020-10-10 MED ORDER — HALOPERIDOL LACTATE 5 MG/ML IJ SOLN: 5.0000 mg | Freq: Four times a day (QID) | INTRAMUSCULAR | Status: DC | PRN

## 2020-10-10 MED ORDER — SACUBITRIL-VALSARTAN 97-103 MG PO TABS
1.0000 | ORAL_TABLET | Freq: Two times a day (BID) | ORAL | Status: DC
  Administered 2020-10-11 – 2020-10-12 (×4): 1 via ORAL
  Filled 2020-10-10 (×7): qty 1

## 2020-10-10 MED ORDER — DEXMEDETOMIDINE HCL IN NACL 200 MCG/50ML IV SOLN
0.4000 ug/kg/h | INTRAVENOUS | Status: DC
  Administered 2020-10-10: 1 ug/kg/h via INTRAVENOUS
  Administered 2020-10-10: 0.2 ug/kg/h via INTRAVENOUS
  Administered 2020-10-10: 0.9 ug/kg/h via INTRAVENOUS
  Administered 2020-10-10 (×2): 1.2 ug/kg/h via INTRAVENOUS
  Administered 2020-10-11: 0.9 ug/kg/h via INTRAVENOUS
  Administered 2020-10-11 (×4): 0.7 ug/kg/h via INTRAVENOUS
  Administered 2020-10-11: 0.9 ug/kg/h via INTRAVENOUS
  Administered 2020-10-11: 0.4 ug/kg/h via INTRAVENOUS
  Administered 2020-10-11: 0.9 ug/kg/h via INTRAVENOUS
  Administered 2020-10-12: 0.4 ug/kg/h via INTRAVENOUS
  Filled 2020-10-10 (×9): qty 50
  Filled 2020-10-10: qty 100
  Filled 2020-10-10: qty 50

## 2020-10-10 MED ORDER — LORAZEPAM 2 MG/ML IJ SOLN
1.0000 mg | INTRAMUSCULAR | Status: DC | PRN
Start: 2020-10-10 — End: 2020-10-13
  Administered 2020-10-10: 2 mg via INTRAVENOUS
  Filled 2020-10-10: qty 1

## 2020-10-10 MED ORDER — SERTRALINE HCL 50 MG PO TABS: 25.0000 mg | ORAL_TABLET | Freq: Every day | ORAL | Status: DC | PRN

## 2020-10-10 MED ORDER — FOLIC ACID 1 MG PO TABS: 1.0000 mg | ORAL_TABLET | Freq: Every day | ORAL | Status: DC

## 2020-10-10 MED ORDER — ORAL CARE MOUTH RINSE
15.0000 mL | Freq: Two times a day (BID) | OROMUCOSAL | Status: DC
  Administered 2020-10-10 – 2020-10-12 (×5): 15 mL via OROMUCOSAL

## 2020-10-10 MED ORDER — CHLORHEXIDINE GLUCONATE CLOTH 2 % EX PADS
6.0000 | MEDICATED_PAD | Freq: Every day | CUTANEOUS | Status: DC
  Administered 2020-10-10 – 2020-10-11 (×2): 6 via TOPICAL

## 2020-10-10 MED ORDER — ZOLPIDEM TARTRATE 5 MG PO TABS
10.0000 mg | ORAL_TABLET | Freq: Every evening | ORAL | Status: DC | PRN
  Administered 2020-10-10: 10 mg via ORAL
  Filled 2020-10-10: qty 2

## 2020-10-10 NOTE — Progress Notes (Signed)
I was called by RN at bedside right before knowing about patient's worsening withdrawal and aggressive behavior.  I arrived at the bedside right away.  He was being held down in the bed by 4-5 people.  Staff was getting ready to put him on four-point restraints.  This was a significant change compared to just few hours ago.  At this point in time, there is an indication to start him on Precedex drip.  I consulted PCCM.

## 2020-10-10 NOTE — Plan of Care (Signed)
?  Problem: Coping: ?Goal: Level of anxiety will decrease ?Outcome: Progressing ?  ?Problem: Safety: ?Goal: Ability to remain free from injury will improve ?Outcome: Progressing ?  ?

## 2020-10-10 NOTE — Progress Notes (Signed)
Admits to smoking : placed nicotine patch to right arm : stressed smoking while on nicotine patch maybe de fatal.

## 2020-10-10 NOTE — Progress Notes (Signed)
PROGRESS NOTE    Robert [name]  EVO:[id number] DOB: [birth year] DOA: 10/08/2020 PCP: Flossie Buffy, NP   Brief Narrative:  Robert [name] is a 40 y.o. male with medical history significant of chronic systolic CHF/nonischemic cardiomyopathy (EF 30% on echo done February 2022), left ventricular noncompaction, history of PE, asthma, gastritis, mood disorder, polysubstance abuse (alcohol, cocaine, marijuana, tobacco), hospital admission last month for severe alcohol withdrawal/delirium tremens.  He presented to the ED with complaints of left-sided chest pain/left upper quadrant abdominal pain, shortness of breath, cough, nausea, and vomiting. COVID and influenza PCR negative.  D-dimer 1.0.  Lactic acid 8.8.  VBG showing pH 7.36.  Blood culture x2 pending.  Chest x-ray showing no acute cardiopulmonary process.  CT angiogram chest negative for PE.  Showing a pneumatocele of the inferior left lower lobe with layering frothy fluid suspicious for superimposed infection.  CT abdomen pelvis showing profound hepatic steatosis but no acute abnormality.  Antibiotics ordered and patient was 30 cc/kg fluid boluses per sepsis protocol.  He was started on CIWA protocol.  Lactate improved to 4.7 after fluid boluses.  Patient reports not feeling well for the past few days.  He was feeling weak, coughing up clear sputum, and experiencing left-sided lower chest/upper abdominal pain.  He has been vomiting for the past 2 days and has not been able to eat or consume any alcohol however his alcohol level was 180 upon admission.  He normally drinks several airplane bottles of vodka daily.    Assessment & Plan:   Principal Problem:   Pneumonia Active Problems:   Chronic systolic CHF (congestive heart failure) (HCC)   AKI (acute kidney injury) (HCC)   Nausea & vomiting   Severe sepsis (HCC)  Severe sepsis secondary to possible aspiration pneumonia/ infected pneumatocele: Met criteria for severe sepsis with  tachycardia, tachypnea, and severe lactic acidosis.  COVID and influenza PCR negative.  Not hypoxic.  Not hypotensive.  No leukocytosis.  Afebrile.  Location of the pneumonia in combination of possible dental infection raises suspicion for possible aspiration pneumonia.  Antibiotics switched to Unasyn.  Vancomycin discontinued.  Dental caries: Patient was unable to open his mouth fully and because of that, his speech is also slightly dysarthric.  I suspected possible abscess.  CT maxillofacial ruled out abscess.  Please read detailed reports.  He will need to follow-up with dentist as outpatient.  Hypomagnesemia: Resolved.  Nausea, vomiting, abdominal pain Transaminases chronically elevated in the setting of alcohol abuse.  Alk phos and T bili normal.  Lipase normal.  CT without acute findings. He does have a history of gastritis which could be contributing.  Symptoms improved.  Continue IV Protonix, continue sucralfate.  Avoid QT prolonging antiemetics.  Mild AKI: Resolved.  High anion gap metabolic acidosis: Resolved.  Chronic systolic CHF/nonischemic cardiomyopathy: EF 30% on echo done in February 2022.  No significant elevation of BNP or signs of volume overload. -Continue home Coreg, digoxin, ivabradine, and [place]. Entresto and spironolactone held given mild AKI which has now resolved so I will resume dosing as well.  Alcohol abuse/acute alcohol withdrawal: Alcohol level 188 presentation.  His CIWA has been over 15 for last 12 hours and he had received 16 mg of Ativan per CIWA protocol.  I received a page from patient's primary nurse that he is out of control and they recommended transferring him to ICU for Precedex drip.  I went to see patient few minutes later.  To my surprise, he was by himself  in the room and trying to get up/stand up and trying to get wires untangle.  However, he was easily redirectable.  He sat down in the bed per my request.  We had good conversation, he was able  to answer all the questions.  He had fine tremors in upper extremities.  Per my evaluation, his CIWA was fairly 8-10.  He was craving for cigarette and was trying to go out of the hospital to smoke cigarette.  He was already on nicotine patch.  Since he was easily redirectable and was able to take p.o., he did not meet any criteria to start him on Precedex drip.  He definitely needed one-to-one sitter.  I discussed with primary RN and charge nurse on the floor and I was informed that they did not have manpower/sitter available.  I started this patient on Librium 25 mg 3 times daily and transferred him to stepdown with one-to-one sitter.  Continue CIWA protocol.  Monitor closely.  Elevated D-dimer Likely due to infection/severe sepsis.  CT angiogram negative for PE.  No clinical signs of DVT.  Tobacco use -NicoDerm patch and counseling  Asthma Stable.  No signs of acute exacerbation. -Albuterol nebulizer as needed  Mood disorder -Continue Seroquel  QT prolongation -Cardiac monitoring.  Monitor potassium and magnesium levels.  Avoid QT prolonging drugs if possible.  Repeat EKG in a.m.  DVT prophylaxis: enoxaparin (LOVENOX) injection 40 mg Start: 10/08/20 2200   Code Status: Full Code  Family Communication:  None present at bedside.   Status is: Inpatient  Remains inpatient appropriate because:Inpatient level of care appropriate due to severity of illness   Dispo: The patient is from: Home              Anticipated d/c is to: Home              Patient currently is not medically stable to d/c.   Difficult to place patient No        Estimated body mass index is 27.46 kg/m as calculated from the following:   Height as of this encounter: _0  (1.778 m).   Weight as of this encounter: 86.8 kg.      Nutritional status:               Consultants:   None  Procedures:   None  Antimicrobials:  Anti-infectives (From admission, onward)   Start     Dose/Rate  Route Frequency Ordered Stop   10/09/20 1445  Ampicillin-Sulbactam (UNASYN) 3 g in sodium chloride 0.9 % 100 mL IVPB        3 g 200 mL/hr over 30 Minutes Intravenous Every 6 hours 10/09/20 1422     10/09/20 1000  vancomycin (VANCOCIN) IVPB 1000 mg/200 mL premix  Status:  Discontinued        1,000 mg 200 mL/hr over 60 Minutes Intravenous Every 12 hours 10/08/20 2105 10/09/20 0902   10/09/20 1000  vancomycin (VANCOREADY) IVPB 1250 mg/250 mL  Status:  Discontinued        1,250 mg 166.7 mL/hr over 90 Minutes Intravenous Every 12 hours 10/09/20 0902 10/10/20 0936   10/09/20 0600  ceFEPIme (MAXIPIME) 2 g in sodium chloride 0.9 % 100 mL IVPB  Status:  Discontinued        2 g 200 mL/hr over 30 Minutes Intravenous Every 8 hours 10/08/20 2105 10/09/20 1411   10/08/20 2115  vancomycin (VANCOREADY) IVPB 1750 mg/350 mL        1,750 mg 175  mL/hr over 120 Minutes Intravenous  Once 10/08/20 2101 10/08/20 2359   10/08/20 1930  azithromycin (ZITHROMAX) 500 mg in sodium chloride 0.9 % 250 mL IVPB  Status:  Discontinued        500 mg 250 mL/hr over 60 Minutes Intravenous  Once 10/08/20 1926 10/08/20 2056   10/08/20 1930  vancomycin (VANCOCIN) IVPB 1000 mg/200 mL premix  Status:  Discontinued        1,000 mg 200 mL/hr over 60 Minutes Intravenous  Once 10/08/20 1926 10/08/20 2101   10/08/20 1930  ceFEPIme (MAXIPIME) 2 g in sodium chloride 0.9 % 100 mL IVPB        2 g 200 mL/hr over 30 Minutes Intravenous  Once 10/08/20 1926 10/08/20 2128         Subjective: Seen and examined.  Patient slightly fidgety and eager to go out and smoke but is easily redirectable, fully alert and oriented and was not aggressive at all during my encounter.  Objective: Vitals:   10/09/20 2056 10/10/20 0200 10/10/20 0300 10/10/20 1050  BP:  (!) 134/96 125/89 (!) 164/106  Pulse:  92 93 89  Resp:   18 17  Temp: 98.5 F (36.9 C)  98.4 F (36.9 C) 97.9 F (36.6 C)  TempSrc: Oral  Oral Axillary  SpO2:   99% 100%  Weight:     86.8 kg  Height:    _0  (1.778 m)    Intake/Output Summary (Last 24 hours) at 10/10/2020 1122 Last data filed at 10/10/2020 0956 Gross per 24 hour  Intake 916.28 ml  Output --  Net 916.28 ml   Filed Weights   10/08/20 1618 10/10/20 1050  Weight: 84.4 kg 86.8 kg    Examination:  General exam: Appears slightly fidgety and restless. Respiratory system: Clear to auscultation. Respiratory effort normal. Cardiovascular system: S1 & S2 heard, RRR. No JVD, murmurs, rubs, gallops or clicks. No pedal edema. Gastrointestinal system: Abdomen is nondistended, soft and nontender. No organomegaly or masses felt. Normal bowel sounds heard. Central nervous system: Alert and oriented. No focal neurological deficits. Extremities: Symmetric 5 x 5 power.  Data Reviewed: I have personally reviewed following labs and imaging studies  CBC: Recent Labs  Lab 10/08/20 1654 10/10/20 0418  WBC 6.0 3.1*  NEUTROABS 4.4 1.8  HGB 13.1 9.9*  HCT 39.3 30.4*  MCV 88.9 90.2  PLT 215 277*   Basic Metabolic Panel: Recent Labs  Lab 10/08/20 1654 10/08/20 1819 10/09/20 0535 10/09/20 0908 10/10/20 0418  NA 142  --  137  --  136  K 3.7  --  3.7  --  3.7  CL 100  --  104  --  105  CO2 18*  --  26  --  24  GLUCOSE 114*  --  137*  --  109*  BUN 14  --  12  --  9  CREATININE 1.35*  --  1.05  --  1.23  CALCIUM 9.3  --  8.3*  --  8.5*  MG  --  1.4*  --  1.0* 2.1  PHOS  --   --  3.0  --   --    GFR: Estimated Creatinine Clearance: 83.3 mL/min (by C-G formula based on SCr of 1.23 mg/dL). Liver Function Tests: Recent Labs  Lab 10/08/20 1654 10/10/20 0418  AST 99* 57*  ALT 74* 44  ALKPHOS 55 51  BILITOT 0.7 0.3  PROT 8.5* 6.4*  ALBUMIN 4.9 3.6   Recent Labs  Lab  10/08/20 1654  LIPASE 25   No results for input(s): AMMONIA in the last 168 hours. Coagulation Profile: No results for input(s): INR, PROTIME in the last 168 hours. Cardiac Enzymes: No results for input(s): CKTOTAL, CKMB,  CKMBINDEX, TROPONINI in the last 168 hours. BNP (last 3 results) No results for input(s): PROBNP in the last 8760 hours. HbA1C: No results for input(s): HGBA1C in the last 72 hours. CBG: No results for input(s): GLUCAP in the last 168 hours. Lipid Profile: No results for input(s): CHOL, HDL, LDLCALC, TRIG, CHOLHDL, LDLDIRECT in the last 72 hours. Thyroid Function Tests: No results for input(s): TSH, T4TOTAL, FREET4, T3FREE, THYROIDAB in the last 72 hours. Anemia Panel: No results for input(s): VITAMINB12, FOLATE, FERRITIN, TIBC, IRON, RETICCTPCT in the last 72 hours. Sepsis Labs: Recent Labs  Lab 10/08/20 1654 10/08/20 1854 10/09/20 0908  LATICACIDVEN 8.8* 4.7* 1.6    Recent Results (from the past 240 hour(s))  Resp Panel by RT-PCR (Flu A&B, Covid) Nasopharyngeal Swab     Status: None   Collection Time: 10/08/20  4:54 PM   Specimen: Nasopharyngeal Swab; Nasopharyngeal(NP) swabs in vial transport medium  Result Value Ref Range Status   SARS Coronavirus 2 by RT PCR NEGATIVE NEGATIVE Final    Comment: (NOTE) SARS-CoV-2 target nucleic acids are NOT DETECTED.  The SARS-CoV-2 RNA is generally detectable in upper respiratory specimens during the acute phase of infection. The lowest concentration of SARS-CoV-2 viral copies this assay can detect is 138 copies/mL. A negative result does not preclude SARS-Cov-2 infection and should not be used as the sole basis for treatment or other patient management decisions. A negative result may occur with  improper specimen collection/handling, submission of specimen other than nasopharyngeal swab, presence of viral mutation(s) within the areas targeted by this assay, and inadequate number of viral copies(<138 copies/mL). A negative result must be combined with clinical observations, patient history, and epidemiological information. The expected result is Negative.  Fact Sheet for Patients:  EntrepreneurPulse.com.au  Fact  Sheet for Healthcare Providers:  IncredibleEmployment.be  This test is no t yet approved or cleared by the [place] FDA and  has been authorized for detection and/or diagnosis of SARS-CoV-2 by FDA under an Emergency Use Authorization (EUA). This EUA will remain  in effect (meaning this test can be used) for the duration of the COVID-19 declaration under Section 564(b)(1) of the Act, 21 U.S.C.section 360bbb-3(b)(1), unless the authorization is terminated  or revoked sooner.       Influenza A by PCR NEGATIVE NEGATIVE Final   Influenza B by PCR NEGATIVE NEGATIVE Final    Comment: (NOTE) The Xpert Xpress SARS-CoV-2/FLU/RSV plus assay is intended as an aid in the diagnosis of influenza from Nasopharyngeal swab specimens and should not be used as a sole basis for treatment. Nasal washings and aspirates are unacceptable for Xpert Xpress SARS-CoV-2/FLU/RSV testing.  Fact Sheet for Patients: EntrepreneurPulse.com.au  Fact Sheet for Healthcare Providers: IncredibleEmployment.be  This test is not yet approved or cleared by the [place] FDA and has been authorized for detection and/or diagnosis of SARS-CoV-2 by FDA under an Emergency Use Authorization (EUA). This EUA will remain in effect (meaning this test can be used) for the duration of the COVID-19 declaration under Section 564(b)(1) of the Act, 21 U.S.C. section 360bbb-3(b)(1), unless the authorization is terminated or revoked.  Performed at [facility], [place] [address] [place], [place] [address]   Blood culture (routine x 2)     Status: None (Preliminary result)  Collection Time: 10/08/20  5:35 PM   Specimen: BLOOD  Result Value Ref Range Status   Specimen Description   Final    BLOOD LEFT ANTECUBITAL Performed at [facility] [address] [place], [place] [address]    Special Requests   Final    BOTTLES DRAWN AEROBIC  AND ANAEROBIC Blood Culture adequate volume Performed at [facility] [address], [place], [place] [address]    Culture   Final    NO GROWTH 1 DAY Performed at [facility], [place] [address] [place], [place] [address]    Report Status PENDING  Incomplete  Blood culture (routine x 2)     Status: None (Preliminary result)   Collection Time: 10/08/20  6:49 PM   Specimen: BLOOD  Result Value Ref Range Status   Specimen Description   Final    BLOOD LEFT ANTECUBITAL Performed at [facility] [address], [place], [place] [address]    Special Requests   Final    BOTTLES DRAWN AEROBIC AND ANAEROBIC Blood Culture results may not be optimal due to an inadequate volume of blood received in culture bottles Performed at [facility] [address] [place], [place] [address]    Culture   Final    NO GROWTH 1 DAY Performed at [facility], [place] [address], [place], [place] [address]    Report Status PENDING  Incomplete  MRSA PCR Screening     Status: None   Collection Time: 10/09/20 12:38 PM   Specimen: Nasopharyngeal  Result Value Ref Range Status   MRSA by PCR NEGATIVE NEGATIVE Final    Comment:        The GeneXpert MRSA Assay (FDA approved for NASAL specimens only), is one component of a comprehensive MRSA colonization surveillance program. It is not intended to diagnose MRSA infection nor to guide or monitor treatment for MRSA infections. Performed at [facility], [place] [address] [place], [place] [address]       Radiology Studies: CT Angio Chest PE W/Cm &/Or Wo Cm  Result Date: 10/08/2020 CLINICAL DATA:  Shortness of breath, abdominal pain EXAM: CT ANGIOGRAPHY CHEST CT ABDOMEN AND PELVIS WITH CONTRAST TECHNIQUE: Multidetector CT imaging of the chest was performed using the standard protocol during bolus administration of intravenous contrast. Multiplanar CT image reconstructions and  MIPs were obtained to evaluate the vascular anatomy. Multidetector CT imaging of the abdomen and pelvis was performed using the standard protocol during bolus administration of intravenous contrast. CONTRAST:  18m OMNIPAQUE IOHEXOL 350 MG/ML SOLN COMPARISON:  Oct 04, 2019 FINDINGS: CTA CHEST FINDINGS Cardiovascular: Evaluation of the lung bases is limited secondary to respiratory motion. No evidence of acute pulmonary embolism through the segmental pulmonary arteries. Evaluation of the distal arteries is limited secondary to motion. Mild cardiomegaly. No pericardial effusion. Mediastinum/Nodes: Thyroid is unremarkable. No axillary or mediastinal adenopathy. Lungs/Pleura: There is a cystic area of the LEFT lower lobe with internal frothy debris. It measures approximately 19 mm (series 6, image 94). There is an adjacent cystic area superiorly which measures approximately 13 mm. LEFT lower lobe atelectasis. Musculoskeletal: No acute osseous abnormality. Review of the MIP images confirms the above findings. CT ABDOMEN and PELVIS FINDINGS Hepatobiliary: Hepatic steatosis. Gallbladder is unremarkable. No intrahepatic or extrahepatic biliary ductal dilation. Portal vein is patent. Pancreas: Unremarkable. No pancreatic ductal dilatation or surrounding inflammatory changes. Spleen: Normal in size without focal abnormality. Adrenals/Urinary Tract: Adrenal glands are  unremarkable. No hydronephrosis. Kidneys enhance symmetrically. Subcentimeter hypodense lesion of the superior pole of the LEFT kidney is too small to accurately characterize. Bladder is unremarkable. Stomach/Bowel: No evidence of bowel obstruction. Appendix is normal. Diffuse submucosal fatty deposition throughout the colon, nonspecific and commonly seen in obesity. Stomach is unremarkable. Vascular/Lymphatic: Mild atherosclerotic calcifications of the aorta. No suspicious lymph nodes. Reproductive: Prostate is unremarkable. Other: No free air or free fluid.  Musculoskeletal: No acute or significant osseous findings. Review of the MIP images confirms the above findings. IMPRESSION: 1. No evidence of acute pulmonary embolism. 2. There is a pneumatocele of the inferior LEFT lower lobe with layering frothy fluid. This may reflect superimposed infection. Recommend correlation with symptomatology. 3. Profound hepatic steatosis. 4. No CT etiology for acute abdominal pain identified. Aortic Atherosclerosis (ICD10-I70.0). Electronically Signed   By: Valentino Saxon MD   On: 10/08/2020 19:02   CT ABDOMEN PELVIS W CONTRAST  Result Date: 10/08/2020 CLINICAL DATA:  Shortness of breath, abdominal pain EXAM: CT ANGIOGRAPHY CHEST CT ABDOMEN AND PELVIS WITH CONTRAST TECHNIQUE: Multidetector CT imaging of the chest was performed using the standard protocol during bolus administration of intravenous contrast. Multiplanar CT image reconstructions and MIPs were obtained to evaluate the vascular anatomy. Multidetector CT imaging of the abdomen and pelvis was performed using the standard protocol during bolus administration of intravenous contrast. CONTRAST:  177m OMNIPAQUE IOHEXOL 350 MG/ML SOLN COMPARISON:  Oct 04, 2019 FINDINGS: CTA CHEST FINDINGS Cardiovascular: Evaluation of the lung bases is limited secondary to respiratory motion. No evidence of acute pulmonary embolism through the segmental pulmonary arteries. Evaluation of the distal arteries is limited secondary to motion. Mild cardiomegaly. No pericardial effusion. Mediastinum/Nodes: Thyroid is unremarkable. No axillary or mediastinal adenopathy. Lungs/Pleura: There is a cystic area of the LEFT lower lobe with internal frothy debris. It measures approximately 19 mm (series 6, image 94). There is an adjacent cystic area superiorly which measures approximately 13 mm. LEFT lower lobe atelectasis. Musculoskeletal: No acute osseous abnormality. Review of the MIP images confirms the above findings. CT ABDOMEN and PELVIS FINDINGS  Hepatobiliary: Hepatic steatosis. Gallbladder is unremarkable. No intrahepatic or extrahepatic biliary ductal dilation. Portal vein is patent. Pancreas: Unremarkable. No pancreatic ductal dilatation or surrounding inflammatory changes. Spleen: Normal in size without focal abnormality. Adrenals/Urinary Tract: Adrenal glands are unremarkable. No hydronephrosis. Kidneys enhance symmetrically. Subcentimeter hypodense lesion of the superior pole of the LEFT kidney is too small to accurately characterize. Bladder is unremarkable. Stomach/Bowel: No evidence of bowel obstruction. Appendix is normal. Diffuse submucosal fatty deposition throughout the colon, nonspecific and commonly seen in obesity. Stomach is unremarkable. Vascular/Lymphatic: Mild atherosclerotic calcifications of the aorta. No suspicious lymph nodes. Reproductive: Prostate is unremarkable. Other: No free air or free fluid. Musculoskeletal: No acute or significant osseous findings. Review of the MIP images confirms the above findings. IMPRESSION: 1. No evidence of acute pulmonary embolism. 2. There is a pneumatocele of the inferior LEFT lower lobe with layering frothy fluid. This may reflect superimposed infection. Recommend correlation with symptomatology. 3. Profound hepatic steatosis. 4. No CT etiology for acute abdominal pain identified. Aortic Atherosclerosis (ICD10-I70.0). Electronically Signed   By: SValentino SaxonMD   On: 10/08/2020 19:02   DG Chest Port 1 View  Result Date: 10/08/2020 CLINICAL DATA:  40year old male with chest pain EXAM: PORTABLE CHEST 1 VIEW COMPARISON:  Chest radiograph dated 10/01/2020. FINDINGS: No focal consolidation, pleural effusion, pneumothorax. Mild cardiomegaly. No acute osseous pathology. IMPRESSION: No acute cardiopulmonary process. Electronically Signed   By:  Anner Crete M.D.   On: 10/08/2020 18:08   CT MAXILLOFACIAL WO CONTRAST  Result Date: 10/09/2020 CLINICAL DATA:  TMJ pain and possible infected  tooth EXAM: CT MAXILLOFACIAL WITHOUT CONTRAST TECHNIQUE: Multidetector CT imaging of the maxillofacial structures was performed. Multiplanar CT image reconstructions were also generated. COMPARISON:  None FINDINGS: Osseous: Bony structures show no acute fracture. Mild degenerative changes in the right temporomandibular joint are noted with flattening of the mandibular condyle. Multiple dental caries are seen. The first molar on the right within the mandible demonstrates significant erosion of the crown and a relatively horizontal appearance. It lies within the gums although no definitive bony anchor is seen. Additionally there is an impacted incisor identified between the root of the central and lateral incisors on the left within the maxilla. No definitive periapical abscess is seen. Orbits: Orbits and their contents are within normal limits. Sinuses: Paranasal sinuses are well aerated. Mucosal retention cyst is noted within the left maxillary antrum. Mild mucosal thickening in the right maxillary antrum is noted. Soft tissues: No soft tissue swelling or edema is noted. Considerable retained cerumen is noted within the external auditory canals bilaterally. Limited intracranial: Intracranial structures are unremarkable. IMPRESSION: The right first mandibular molar shows erosion of the crown consistent with significant dental caries and a somewhat horizontal position along the mandible within the gum line. No definitive bony anchor is noted. No findings to suggest periapical abscess. Scattered dental caries bilaterally Impacted left incisor as described in the maxilla. Degenerative changes of the right mandibular condyle. Considerable impacted cerumen within the external auditory canal bilaterally. Electronically Signed   By: Inez Catalina M.D.   On: 10/09/2020 16:02    Scheduled Meds: . carvedilol  3.125 mg Oral BID  . chlordiazePOXIDE  25 mg Oral TID  . Chlorhexidine Gluconate Cloth  6 each Topical Q0600  .  dapagliflozin propanediol  10 mg Oral Daily  . digoxin  0.125 mg Oral Daily  . enoxaparin (LOVENOX) injection  40 mg Subcutaneous Q24H  . folic acid  1 mg Oral Daily  . ivabradine  7.5 mg Oral BID WC  . magnesium oxide  400 mg Oral Daily  . mouth rinse  15 mL Mouth Rinse BID  . multivitamin with minerals  1 tablet Oral Daily  . nicotine  21 mg Transdermal Daily  . pantoprazole  40 mg Oral QHS  . QUEtiapine  150 mg Oral Daily  . sucralfate  1 g Oral TID WC & HS  . thiamine  100 mg Oral Daily   Or  . thiamine  100 mg Intravenous Daily   Continuous Infusions: . ampicillin-sulbactam (UNASYN) IV 3 g (10/10/20 0900)     LOS: 2 days   Time spent: 34 minutes   Darliss Cheney, MD [facility]  10/10/2020, 11:22 AM   How to contact the [facility] Attending or Consulting provider [place] or covering provider during after hours [place], for this patient?  1. Check the care team in [facility] and look for a) attending/consulting [facility] provider listed and b) the [facility] team listed. Page or secure chat [phone]. 2. Log into [email or web address] and use [place]'s universal password to access. If you do not have the password, please contact the hospital operator. 3. Locate the [facility] provider you are looking for under [facility] and page to a number that you can be directly reached. 4. If you still have difficulty reaching the provider, please page the [facility] (Director on Call) for the Hospitalists  listed on amion for assistance.

## 2020-10-10 NOTE — Progress Notes (Signed)
Notified by safety sitter, Estill Bamberg NT, that she needed immediate help in the room approximately at 1150.  Patient was trying to get out of bed and go towards his mother at bedside. Patient was being verbally aggressive towards mother at that same time. Asked patient to sit back down in the bed. Patient disregarded and headed toward me instead, grabbing my left leg forcefully with one hand and making a fist with the other hand. Patient was trying to put me on the ground and preparing to hit. I grabbed patient's wrist to protect patient from hitting me. Asked Estill Bamberg NT to have someone call for security. Security and MD Pahwani called to bedside. MD Pahwani consulting CCM for precedex gtt and 4 point restraints (Bilateral wrist and ankle).

## 2020-10-10 NOTE — Consult Note (Addendum)
NAME:  Robert [name], MRN:  [id number], DOB:  07/06/1980, LOS: 2 ADMISSION DATE:  10/08/2020, CONSULTATION DATE:  5/31 REFERRING MD:  Dr. Doristine Bosworth, CHIEF COMPLAINT:  ETOH withdrawal  History of Present Illness:  Patient is encephalopathic and/or intubated. Therefore history has been obtained from chart review.  40 year old male with PMH as below, which is significant for HFrEF, ETOH abuse, and cocaine abuse. First documented with CHF in 09/2019 at which point LVEF was 15% with noncompaction and felt to be secondary to substance abuse and CAD workup was negative at the time. He is managed in the heart failure clinic and has had recovery of his EF to 30%. He has had several encounters with ED and admission since his initial diagnosis, most notable for alcohol withdrawal including seizure. He presented to [facility] ED 5/29 with complaints of left sided chest pain/LUQ pain. Workup in the ED included CT chest, which showed pneumatocele in the left lower lobe. He was started on empiric antibiotics and admitted to the hospitalist team.   His course then became complicated by delirium concerning for alcohol withdrawal. ETOH level was 188 on presentation. Despite benzodiazepine administration the patient had a consistently elevated CIWA score and was increasingly posing a risk to himself. He was transferred to the ICU on 5/31 and PCCM was consulted for assistance managing withdrawal symptoms.   Pertinent  Medical History   has a past medical history of Alcohol use, Alcohol withdrawal ([place]) (03/06/2020), Asthma, and CHF (congestive heart failure) ([place]).   Significant Hospital Events: Including procedures, antibiotic start and stop dates in addition to other pertinent events   . CTA chest: no PE, pneumatocele left lower lobe with air/fluid level.  . CT abdomen/pelvis: profound hepatic steatosis. No etiology for abdominal pain found.  . CT maxillofacial: severe dental caries. No abscess identified.   . 5/29 admit . 5/31 tx to ICU for agitated delirium.   Interim History / Subjective:    Objective   Blood pressure (!) 155/109, pulse 85, temperature 97.9 F (36.6 C), temperature source Axillary, resp. rate 17, height 5\' 10"  (1.778 m), weight 86.8 kg, SpO2 100 %.        Intake/Output Summary (Last 24 hours) at 10/10/2020 1216 Last data filed at 10/10/2020 1100 Gross per 24 hour  Intake 916.28 ml  Output 400 ml  Net 516.28 ml   Filed Weights   10/08/20 1618 10/10/20 1050  Weight: 84.4 kg 86.8 kg    Examination: General: middle aged male HENT: [place]/AT, PERRL, no JVD. Poor dentition. Will not open mouth for full exam.  Lungs: Clear bilateral breath sounds Cardiovascular: HR 90s, regular.  Abdomen: Soft, non-tender, non-distended Extremities: No acute deformity. Moving all extremities. No edema.  Neuro: Agitated, delirium, asking "what are the charges". Frequent violent outbursts attempting to strike staff and family.    Labs/imaging that I havepersonally reviewed  (right click and "Reselect all SmartList Selections" daily)  CT scans as outlined above  Resolved Hospital Problem list     Assessment & Plan:   Alcohol withdrawal complicated by delirium tremens: patient has significant history of ETOH since age 43 per records with recent history of withdrawal seizures. Describes recent use as several "airplane bottles" per day. He has failed traditional CIWA protocol and has been transferred to ICU. He also has a history of cocaine use. Tox screen positive for only THC.  - ICU monitoring - Restraints x4 for now until his outbursts are better controlled.  - Start precedex  infusion - PRN ativan, valium, haldol. QTC monitoring. Hold haldol for > 500. - PRN ativan.   - Will need to be careful with precedex as he has several negative chronotropes on his MAR - DC seroquel PO, Zyprexa PO.  - DC ambien, DC PRN sertraline - Thiamine, folate, MVI  Sepsis secondary to cavitary  pneumonia in the left lower lobe: presumably secondary to poor dentition +/- aspiration in the setting of alcohol use. Dental abscess ruled out by CT.  - continue unasyn while PO status in question.  - plan for 4-6 week course with outpatient follow up and repeat imaging.  - Blood cultures pending - No sputum culture so far and seems unlikely we will be able to obtain one at this point. - Needs outpatient dental follow up  Chronic HFrEF: secondary to NICM in the setting of cocaine and alcohol abuse. Followed by CHF clinic. LVEF 30%. - Continue entresto, carvedilol, digoxin, aldactone, and ivabradine when he is able to take PO - If he is unable, then we will need to hold these and consider cardiology consultatoin.  - Watch renal function, small increase in creatinine from baseline. May need to consider holding entresto, aldactone from that perspective.  - check dig level - Low threshold to ask his cardiologists to evaluate.  - Continue Farxiga for risk reduction  Gastritis: - PPI continue - Sucralfate  Hypomagnesemia: resolved     Best practice (right click and "Reselect all SmartList Selections" daily)  Diet:  NPO except sips with meds Pain/Anxiety/Delirium protocol (if indicated): Yes (RASS goal -1) VAP protocol (if indicated): Not indicated DVT prophylaxis: LMWH GI prophylaxis: PPI Glucose control:  SSI No Central venous access:  N/A Arterial line:  N/A Foley:  N/A Mobility:  bed rest  PT consulted: N/A Last date of multidisciplinary goals of care discussion [ ]  Code Status:  full code Disposition: ICU  Labs   CBC: Recent Labs  Lab 10/08/20 1654 10/10/20 0418  WBC 6.0 3.1*  NEUTROABS 4.4 1.8  HGB 13.1 9.9*  HCT 39.3 30.4*  MCV 88.9 90.2  PLT 215 134*    Basic Metabolic Panel: Recent Labs  Lab 10/08/20 1654 10/08/20 1819 10/09/20 0535 10/09/20 0908 10/10/20 0418  NA 142  --  137  --  136  K 3.7  --  3.7  --  3.7  CL 100  --  104  --  105  CO2 18*  --   26  --  24  GLUCOSE 114*  --  137*  --  109*  BUN 14  --  12  --  9  CREATININE 1.35*  --  1.05  --  1.23  CALCIUM 9.3  --  8.3*  --  8.5*  MG  --  1.4*  --  1.0* 2.1  PHOS  --   --  3.0  --   --    GFR: Estimated Creatinine Clearance: 83.3 mL/min (by C-G formula based on SCr of 1.23 mg/dL). Recent Labs  Lab 10/08/20 1654 10/08/20 1854 10/09/20 0908 10/10/20 0418  WBC 6.0  --   --  3.1*  LATICACIDVEN 8.8* 4.7* 1.6  --     Liver Function Tests: Recent Labs  Lab 10/08/20 1654 10/10/20 0418  AST 99* 57*  ALT 74* 44  ALKPHOS 55 51  BILITOT 0.7 0.3  PROT 8.5* 6.4*  ALBUMIN 4.9 3.6   Recent Labs  Lab 10/08/20 1654  LIPASE 25   No results for input(s): AMMONIA in the  last 168 hours.  ABG    Component Value Date/Time   PHART 7.486 (H) 10/06/2019 1212   PCO2ART 40.0 10/06/2019 1212   PO2ART 165 (H) 10/06/2019 1212   HCO3 20.9 10/08/2020 1807   TCO2 31 10/06/2019 1212   ACIDBASEDEF 3.5 (H) 10/08/2020 1807   O2SAT 55.4 10/08/2020 1807     Coagulation Profile: No results for input(s): INR, PROTIME in the last 168 hours.  Cardiac Enzymes: No results for input(s): CKTOTAL, CKMB, CKMBINDEX, TROPONINI in the last 168 hours.  HbA1C: Hgb A1c MFr Bld  Date/Time Value Ref Range Status  06/23/2020 10:34 AM 5.6 4.6 - 6.5 % Final    Comment:    Glycemic Control Guidelines for People with Diabetes:Non Diabetic:  <6%Goal of Therapy: <7%Additional Action Suggested:  >8%     CBG: No results for input(s): GLUCAP in the last 168 hours.  Review of Systems:   Patient is encephalopathic and/or intubated. Therefore history has been obtained from chart review.   Past Medical History:  He,  has a past medical history of Alcohol use, Alcohol withdrawal ([place]) (03/06/2020), Asthma, and CHF (congestive heart failure) ([place]).   Surgical History:   Past Surgical History:  Procedure Laterality Date  . ESOPHAGOGASTRODUODENOSCOPY (EGD) WITH PROPOFOL N/A 11/01/2019   Procedure:  ESOPHAGOGASTRODUODENOSCOPY (EGD) WITH PROPOFOL;  Surgeon: [name], MD;  Location[facility] ENDOSCOPY;  Service: Endoscopy;  Laterality: N/A;  . RIGHT/LEFT HEART CATH AND CORONARY ANGIOGRAPHY N/A 10/06/2019   Procedure: RIGHT/LEFT HEART CATH AND CORONARY ANGIOGRAPHY;  Surgeon: [name], MD;  Location: [facility] LAB;  Service: Cardiovascular;  Laterality: N/A;  . WRIST SURGERY       Social History:   reports that he has been smoking cigarettes. He has a 6.00 pack-year smoking history. He has never used smokeless tobacco. He reports current alcohol use of about 8.0 standard drinks of alcohol per week. He reports current drug use. Frequency: 1.00 time per week. Drug: Marijuana.   Family History:  His family history includes Diabetes in his father; Heart disease (age of onset: 78) in his father; Heart disease (age of onset: 68) in his maternal grandfather and paternal grandfather; Heart failure in his father; Kidney disease in his father.   Allergies Allergies  Allergen Reactions  . Bactrim [Sulfamethoxazole-Trimethoprim] Rash     Home Medications  Prior to Admission medications   Medication Sig Start Date End Date Taking? Authorizing Provider  carvedilol (COREG) 3.125 MG tablet Take 1 tablet (3.125 mg total) by mouth 2 (two) times daily. 04/11/20 04/11/21 Yes Clegg, Amy D, NP  digoxin (LANOXIN) 0.125 MG tablet Take 1 tablet (0.125 mg total) by mouth daily. 06/14/20  Yes Simmons, Brittainy M, PA-C  FARXIGA 10 MG TABS tablet TAKE 1 TABLET BY MOUTH ONCE DAILY BEFORE BREAKFAST Patient taking differently: Take 10 mg by mouth daily. 09/07/20  Yes Bensimhon, Shaune Pascal, MD  folic acid (FOLVITE) 1 MG tablet Take 1 tablet (1 mg total) by mouth daily. 03/16/20  Yes Bensimhon, Shaune Pascal, MD  ivabradine (CORLANOR) 7.5 MG TABS tablet Take 1 tablet (7.5 mg total) by mouth 2 (two) times daily with a meal. 03/27/20  Yes Bensimhon, Shaune Pascal, MD  LORazepam (ATIVAN) 1 MG tablet Take 1 tablet (1 mg total) by  mouth every 6 (six) hours as needed for anxiety. 08/20/20  Yes Arrien, Jimmy Picket, MD  magnesium oxide (MAG-OX) 400 MG tablet Take 400 mg by mouth daily.   Yes [provider]  ondansetron (ZOFRAN ODT)  4 MG disintegrating tablet Take 1 tablet (4 mg total) by mouth every 8 (eight) hours as needed for nausea or vomiting. 08/20/20  Yes Arrien, Jimmy Picket, MD  pantoprazole (PROTONIX) 40 MG tablet Take 1 tablet (40 mg total) by mouth daily. 08/20/20 09/19/20 Yes Arrien, Jimmy Picket, MD  potassium chloride (KLOR-CON) 20 MEQ packet Take 20 mEq by mouth daily.   Yes [provider]  QUEtiapine (SEROQUEL) 100 MG tablet Take 1.5 tablets (150 mg total) by mouth daily. 06/23/20  Yes Nche, Charlene Brooke, NP  sacubitril-valsartan (ENTRESTO) 97-103 MG Take 1 tablet by mouth 2 (two) times daily. 06/16/20  Yes Lyda Jester M, PA-C  sertraline (ZOLOFT) 25 MG tablet Take 25 mg by mouth daily as needed (anxiety).   Yes [provider]  spironolactone (ALDACTONE) 25 MG tablet Take 1 tablet (25 mg total) by mouth daily. HOLD until 03/26/20 Patient taking differently: Take 25 mg by mouth daily. 03/24/20  Yes Oretha Milch D, MD  sucralfate (CARAFATE) 1 g tablet Take 1 tablet (1 g total) by mouth 4 (four) times daily -  with meals and at bedtime. 07/31/20  Yes Bonnielee Haff, MD  thiamine 100 MG tablet Take 1 tablet (100 mg total) by mouth daily. 07/31/20  Yes Bonnielee Haff, MD     Critical care time: 55 minutes      Georgann Housekeeper, AGACNP-BC [facility]  See Amion for personal pager PCCM on call pager [phone] until 7pm. Please call Elink 7p-7a. [phone]  10/10/2020 1:05 PM

## 2020-10-10 NOTE — Progress Notes (Signed)
Continues restlessness, anxiety. CIWA 13 Discussed case with [facility]. May need precedex as requiring max dosing of ativan . Per Dr Sidney Ace try 2.5 of zyprexa

## 2020-10-11 LAB — CBC WITH DIFFERENTIAL/PLATELET
Abs Immature Granulocytes: 0.02 10*3/uL (ref 0.00–0.07)
Basophils Absolute: 0 10*3/uL (ref 0.0–0.1)
Basophils Relative: 1 %
Eosinophils Absolute: 0.2 10*3/uL (ref 0.0–0.5)
Eosinophils Relative: 5 %
HCT: 32.4 % — ABNORMAL LOW (ref 39.0–52.0)
Hemoglobin: 10.7 g/dL — ABNORMAL LOW (ref 13.0–17.0)
Immature Granulocytes: 0 %
Lymphocytes Relative: 19 %
Lymphs Abs: 0.9 10*3/uL (ref 0.7–4.0)
MCH: 29.9 pg (ref 26.0–34.0)
MCHC: 33 g/dL (ref 30.0–36.0)
MCV: 90.5 fL (ref 80.0–100.0)
Monocytes Absolute: 0.4 10*3/uL (ref 0.1–1.0)
Monocytes Relative: 8 %
Neutro Abs: 3 10*3/uL (ref 1.7–7.7)
Neutrophils Relative %: 67 %
Platelets: 137 10*3/uL — ABNORMAL LOW (ref 150–400)
RBC: 3.58 MIL/uL — ABNORMAL LOW (ref 4.22–5.81)
RDW: 13.4 % (ref 11.5–15.5)
WBC: 4.5 10*3/uL (ref 4.0–10.5)
nRBC: 0 % (ref 0.0–0.2)

## 2020-10-11 LAB — COMPREHENSIVE METABOLIC PANEL
ALT: 61 U/L — ABNORMAL HIGH (ref 0–44)
AST: 87 U/L — ABNORMAL HIGH (ref 15–41)
Albumin: 3.6 g/dL (ref 3.5–5.0)
Alkaline Phosphatase: 45 U/L (ref 38–126)
Anion gap: 14 (ref 5–15)
BUN: 9 mg/dL (ref 6–20)
CO2: 20 mmol/L — ABNORMAL LOW (ref 22–32)
Calcium: 8.9 mg/dL (ref 8.9–10.3)
Chloride: 109 mmol/L (ref 98–111)
Creatinine, Ser: 0.99 mg/dL (ref 0.61–1.24)
GFR, Estimated: >60 mL/min (ref >60)
Glucose, Bld: 92 mg/dL (ref 70–99)
Potassium: 3.5 mmol/L (ref 3.5–5.1)
Sodium: 143 mmol/L (ref 135–145)
Total Bilirubin: 1.2 mg/dL (ref 0.3–1.2)
Total Protein: 6.5 g/dL (ref 6.5–8.1)

## 2020-10-11 LAB — MAGNESIUM: Magnesium: 1.7 mg/dL (ref 1.7–2.4)

## 2020-10-11 LAB — PHOSPHORUS: Phosphorus: 3.6 mg/dL (ref 2.5–4.6)

## 2020-10-11 LAB — LEGIONELLA PNEUMOPHILA SEROGP 1 UR AG: L. pneumophila Serogp 1 Ur Ag: NEGATIVE

## 2020-10-11 MED ORDER — HYDRALAZINE HCL 20 MG/ML IJ SOLN
10.0000 mg | Freq: Four times a day (QID) | INTRAMUSCULAR | Status: DC | PRN
  Administered 2020-10-11 – 2020-10-13 (×2): 10 mg via INTRAVENOUS
  Filled 2020-10-11 (×2): qty 1

## 2020-10-11 MED ORDER — LABETALOL HCL 5 MG/ML IV SOLN
10.0000 mg | INTRAVENOUS | Status: DC | PRN
  Administered 2020-10-13: 10 mg via INTRAVENOUS
  Filled 2020-10-11: qty 4

## 2020-10-11 MED ORDER — POTASSIUM PHOSPHATES 15 MMOLE/5ML IV SOLN: 20.0000 mmol | Freq: Once | INTRAVENOUS | Status: DC

## 2020-10-11 MED ORDER — POTASSIUM CHLORIDE 10 MEQ/100ML IV SOLN
10.0000 meq | INTRAVENOUS | Status: AC
Start: 2020-10-11 — End: 2020-10-11
  Administered 2020-10-11 (×4): 10 meq via INTRAVENOUS
  Filled 2020-10-11 (×4): qty 100

## 2020-10-11 NOTE — Progress Notes (Signed)
[facility] Progress Note  Patient: Robert [name]    SWF:[id number]  DOA: 10/08/2020     Date of Service: the patient was seen and examined on 10/11/2020  Brief hospital course: Past medical history of alcohol abuse, cocaine abuse, chronic systolic CHF, NICM, active smoker, marijuana abuse, history of PE and gastritis asthma.  Presents with complaints of left-sided chest pain found left-sided pneumatocele was started on IV antibiotics.  During the course of hospitalization developed alcohol withdrawal symptoms and delirium tremens requiring Precedex. PCCM currently following. Currently plan is monitor for improvement in mentation and continue antibiotics.  Assessment and Plan: 1.  Severe sepsis secondary to aspiration pneumonia Possible infected pneumatocele Met SIRS criteria on admission with tachycardia, tachypnea and lactic acidosis. Currently not hypoxic or hypotensive.  Afebrile. CT angio chest negative for pulmonary embolism but showing left lower lobe pneumatosis a with layering fluid. Started on IV antibiotics currently on IV Unasyn. Blood culture negative for 48 hours.  MRSA PCR negative. COVID-19 and influenza negative. Continue antibiotics for total of 7 days.  2.  Alcohol withdrawal syndrome with delirium tremens Required transfer to the ICU. Drinking alcohol since age 35 per records. Also has history of withdrawal seizures. Patient was on CIWA protocol with IV Ativan but continues to remain significantly agitated.  Requiring Precedex infusion started on 5/31. PCCM managing.  Currently weaning Precedex and transitioning to IV Valium.  Monitor response.  3.  Chronic systolic CHF/NICM Sinus bradycardia Followed by cardiology/heart failure clinic. Likely in the setting of alcohol and cocaine abuse. On Farxiga, Entresto, carvedilol, digoxin, Aldactone and Corlanor. Has sinus bradycardia on Precedex therefore holding Corlanor. Digoxin level 0.2.  4.  Dental  caries CT maxillofacial ruled out abscess. Currently on IV antibiotics.  Outpatient follow-up with dentistry.  5.  Nausea vomiting and abdominal pain Elevated LFTs LFT elevated chronically in the setting of alcohol abuse. Likely is normal. CT abdomen without any acute finding. Nausea vomiting resolved. Continue IV Protonix and Carafate.  6.  Mood disorder. On Seroquel currently on hold.  7.  Prolonged QTC. Monitor.  Last check  May 30. Will recheck EKG.  8.  AKI Hypokalemia hypomagnesemia. Currently being corrected. Baseline serum creatinine less than 1.  Creatinine went as high as 1.35.  Currently back to normal. Monitor.  9.  Lactic acidosis. Secondary to agitation and liver abnormality. Currently improving.  Monitor.  Diet: Regular diet DVT Prophylaxis:   enoxaparin (LOVENOX) injection 40 mg Start: 10/08/20 2200    Advance goals of care discussion: Full code  Family Communication: family was present at bedside, at the time of interview.  The pt provided permission to discuss medical plan with the family. Opportunity was given to ask question and all questions were answered satisfactorily.   Disposition:  Status is: Inpatient  Remains inpatient appropriate because:Altered mental status   Dispo: The patient is from: Home              Anticipated d/c is to: Home              Patient currently is not medically stable to d/c.   Difficult to place patient No        Subjective: No nausea no vomiting.  No fever no chills.  Had some bradycardic events therefore Precedex dose was reduced.  No other acute events overnight.  Physical Exam:  General: Appear in mild distress, no Rash; Oral Mucosa Clear, moist. no Abnormal Neck Mass Or lumps, Conjunctiva normal  Cardiovascular: S1 and S2  Present, no Murmur, Respiratory: good respiratory effort, Bilateral Air entry present and CTA, no Crackles, no wheezes Abdomen: Bowel Sound present, Soft and no  tenderness Extremities: no Pedal edema Neurology: Drowsy but arousable, oriented to person affect appropriate. no new focal deficit Gait not checked due to patient safety concerns  Vitals:   10/11/20 0605 10/11/20 0735 10/11/20 0800 10/11/20 1013  BP: (!) 157/110  (!) 168/107 (!) 159/110  Pulse: (!) 58  64   Resp: 20  18 (!) 28  Temp:  97.6 F (36.4 C)    TempSrc:  Axillary    SpO2: 98%  98%   Weight:      Height:        Intake/Output Summary (Last 24 hours) at 10/11/2020 1118 Last data filed at 10/11/2020 1000 Gross per 24 hour  Intake 1586.47 ml  Output 1650 ml  Net -63.53 ml   Filed Weights   10/08/20 1618 10/10/20 1050  Weight: 84.4 kg 86.8 kg    Data Reviewed: I have personally reviewed and interpreted daily labs, tele strips, imaging. I reviewed all nursing notes, pharmacy notes, vitals, pertinent old records I have discussed plan of care as described above with RN and patient/family.  CBC: Recent Labs  Lab 10/08/20 1654 10/10/20 0418 10/11/20 0301  WBC 6.0 3.1* 4.5  NEUTROABS 4.4 1.8 3.0  HGB 13.1 9.9* 10.7*  HCT 39.3 30.4* 32.4*  MCV 88.9 90.2 90.5  PLT 215 134* 182*   Basic Metabolic Panel: Recent Labs  Lab 10/08/20 1654 10/08/20 1819 10/09/20 0535 10/09/20 0908 10/10/20 0418 10/11/20 0301 10/11/20 0800  NA 142  --  137  --  136 143  --   K 3.7  --  3.7  --  3.7 3.5  --   CL 100  --  104  --  105 109  --   CO2 18*  --  26  --  24 20*  --   GLUCOSE 114*  --  137*  --  109* 92  --   BUN 14  --  12  --  9 9  --   CREATININE 1.35*  --  1.05  --  1.23 0.99  --   CALCIUM 9.3  --  8.3*  --  8.5* 8.9  --   MG  --  1.4*  --  1.0* 2.1  --  1.7  PHOS  --   --  3.0  --   --   --  3.6    Studies: No results found.  Scheduled Meds: . carvedilol  3.125 mg Oral BID  . Chlorhexidine Gluconate Cloth  6 each Topical Q0600  . digoxin  0.125 mg Oral Daily  . enoxaparin (LOVENOX) injection  40 mg Subcutaneous Q24H  . folic acid  1 mg Oral Daily  . mouth  rinse  15 mL Mouth Rinse BID  . multivitamin with minerals  1 tablet Oral Daily  . nicotine  21 mg Transdermal Daily  . pantoprazole  40 mg Oral QHS  . sacubitril-valsartan  1 tablet Oral BID  . spironolactone  25 mg Oral Daily  . sucralfate  1 g Oral TID WC & HS  . thiamine  100 mg Oral Daily   Or  . thiamine  100 mg Intravenous Daily   Continuous Infusions: . ampicillin-sulbactam (UNASYN) IV Stopped (10/11/20 0940)  . dexmedetomidine (PRECEDEX) IV infusion 0.4 mcg/kg/hr (10/11/20 1000)   PRN Meds: albuterol, benzocaine, diazepam, haloperidol lactate, hydrALAZINE, labetalol, LORazepam  Time spent:  35 minutes  Author: Berle Mull, MD [facility] 10/11/2020 11:18 AM  To reach On-call, see care teams to locate the attending and reach out via [email or web address]. Between 7PM-7AM, please contact night-coverage If you still have difficulty reaching the attending provider, please page the [facility] (Director on Call) for [facility] on amion for assistance.

## 2020-10-11 NOTE — Progress Notes (Signed)
K+ 3.5  Replaced per protocol  

## 2020-10-11 NOTE — Consult Note (Signed)
NAME:  Baby Stairs, MRN:  [id number], DOB:  06/27/1980, LOS: 3 ADMISSION DATE:  10/08/2020, CONSULTATION DATE:  5/31 REFERRING MD:  Dr. Doristine Bosworth, CHIEF COMPLAINT:  ETOH withdrawal  History of Present Illness:  Patient is encephalopathic and/or intubated. Therefore history has been obtained from chart review.  40 year old male with PMH as below, which is significant for HFrEF, ETOH abuse, and cocaine abuse. First documented with CHF in 09/2019 at which point LVEF was 15% with noncompaction and felt to be secondary to substance abuse and CAD workup was negative at the time. He is managed in the heart failure clinic and has had recovery of his EF to 30%. He has had several encounters with ED and admission since his initial diagnosis, most notable for alcohol withdrawal including seizure. He presented to [facility] ED 5/29 with complaints of left sided chest pain/LUQ pain. Workup in the ED included CT chest, which showed pneumatocele in the left lower lobe. He was started on empiric antibiotics and admitted to the hospitalist team.   His course then became complicated by delirium concerning for alcohol withdrawal. ETOH level was 188 on presentation. Despite benzodiazepine administration the patient had a consistently elevated CIWA score and was increasingly posing a risk to himself. He was transferred to the ICU on 5/31 and PCCM was consulted for assistance managing withdrawal symptoms.   Pertinent  Medical History   has a past medical history of Alcohol use, Alcohol withdrawal ([place]) (03/06/2020), Asthma, and CHF (congestive heart failure) ([place]).   Significant Hospital Events: Including procedures, antibiotic start and stop dates in addition to other pertinent events   . CTA chest: no PE, pneumatocele left lower lobe with air/fluid level.  . CT abdomen/pelvis: profound hepatic steatosis. No etiology for abdominal pain found.  . CT maxillofacial: severe dental caries. No abscess identified.   . 5/29 admit . 5/31 tx to ICU for agitated delirium, started on precedex  Interim History / Subjective:   No acute events overnight. No further need for valium, haldol or ativan overnight while one precedex.    Objective   Blood pressure (!) 157/110, pulse (!) 58, temperature 97.6 F (36.4 C), temperature source Axillary, resp. rate 20, height 5\' 10"  (1.778 m), weight 86.8 kg, SpO2 98 %.        Intake/Output Summary (Last 24 hours) at 10/11/2020 0818 Last data filed at 10/11/2020 0715 Gross per 24 hour  Intake 1515.49 ml  Output 2050 ml  Net -534.51 ml   Filed Weights   10/08/20 1618 10/10/20 1050  Weight: 84.4 kg 86.8 kg    Examination: General: middle aged male, snoring, no acute distress HENT: [place]/AT, PERRL, no JVD. Poor dentition Lungs: Clear bilateral breath sounds Cardiovascular: rrr, no murmurs Abdomen: Soft, non-tender, non-distended Extremities: no edema, warm Neuro: sedated   Labs/imaging that I have personally reviewed  (right click and "Reselect all SmartList Selections" daily)  CT Chest 5/29 reviewed, cystic airway of the left lower lobe with air fluid level concerning for infection.   6/1 CMP, CBC  Resolved Hospital Problem list     Assessment & Plan:   Alcohol withdrawal complicated by delirium tremens: patient has significant history of ETOH since age 25 per records with recent history of withdrawal seizures. Describes recent use as several "airplane bottles" per day. He has failed traditional CIWA protocol and has been transferred to ICU. He also has a history of cocaine use. Tox screen positive for only THC.  - ICU monitoring - Restraints  x4 for now until his outbursts are better controlled.  - Wean precedex infusion today with use of Valium IV - PRN ativan, valium, haldol. QTC monitoring. Hold haldol for QTc > 500. - Thiamine, folate, MVI  Sepsis secondary to cavitary pneumonia in the left lower lobe: presumably secondary to poor dentition +/-  aspiration in the setting of alcohol use. Dental abscess ruled out by CT.  - continue unasyn while NPO   - plan for 4-6 week course with outpatient follow up and repeat imaging.  - Blood cultures pending - Needs outpatient dental follow up  Chronic HFrEF: secondary to NICM in the setting of cocaine and alcohol abuse. Followed by CHF clinic. LVEF 30%. - Continue entresto, carvedilol, digoxin, aldactone, and ivabradine when he is able to take PO - If he is unable, then we will need to hold these and consider cardiology consultatoin.  - dig level 0.2  Gastritis: - PPI continue - Sucralfate  Hypomagnesemia: resolved   Best practice (right click and "Reselect all SmartList Selections" daily)  Diet:  NPO except sips with meds Pain/Anxiety/Delirium protocol (if indicated): Yes (RASS goal 0) VAP protocol (if indicated): Not indicated DVT prophylaxis: LMWH GI prophylaxis: PPI Glucose control:  SSI No Central venous access:  N/A Arterial line:  N/A Foley:  N/A Mobility:  bed rest  PT consulted: N/A Last date of multidisciplinary goals of care discussion [mother updated at bedside 5/31 ] Code Status:  full code Disposition: ICU  Labs   CBC: Recent Labs  Lab 10/08/20 1654 10/10/20 0418 10/11/20 0301  WBC 6.0 3.1* 4.5  NEUTROABS 4.4 1.8 3.0  HGB 13.1 9.9* 10.7*  HCT 39.3 30.4* 32.4*  MCV 88.9 90.2 90.5  PLT 215 134* 137*    Basic Metabolic Panel: Recent Labs  Lab 10/08/20 1654 10/08/20 1819 10/09/20 0535 10/09/20 0908 10/10/20 0418 10/11/20 0301  NA 142  --  137  --  136 143  K 3.7  --  3.7  --  3.7 3.5  CL 100  --  104  --  105 109  CO2 18*  --  26  --  24 20*  GLUCOSE 114*  --  137*  --  109* 92  BUN 14  --  12  --  9 9  CREATININE 1.35*  --  1.05  --  1.23 0.99  CALCIUM 9.3  --  8.3*  --  8.5* 8.9  MG  --  1.4*  --  1.0* 2.1  --   PHOS  --   --  3.0  --   --   --    GFR: Estimated Creatinine Clearance: 103.4 mL/min (by C-G formula based on SCr of 0.99  mg/dL). Recent Labs  Lab 10/08/20 1654 10/08/20 1854 10/09/20 0908 10/10/20 0418 10/11/20 0301  WBC 6.0  --   --  3.1* 4.5  LATICACIDVEN 8.8* 4.7* 1.6  --   --     Liver Function Tests: Recent Labs  Lab 10/08/20 1654 10/10/20 0418 10/11/20 0301  AST 99* 57* 87*  ALT 74* 44 61*  ALKPHOS 55 51 45  BILITOT 0.7 0.3 1.2  PROT 8.5* 6.4* 6.5  ALBUMIN 4.9 3.6 3.6   Recent Labs  Lab 10/08/20 1654  LIPASE 25   No results for input(s): AMMONIA in the last 168 hours.  ABG    Component Value Date/Time   PHART 7.486 (H) 10/06/2019 1212   PCO2ART 40.0 10/06/2019 1212   PO2ART 165 (H) 10/06/2019 1212  HCO3 20.9 10/08/2020 1807   TCO2 31 10/06/2019 1212   ACIDBASEDEF 3.5 (H) 10/08/2020 1807   O2SAT 55.4 10/08/2020 1807     Coagulation Profile: No results for input(s): INR, PROTIME in the last 168 hours.  Cardiac Enzymes: No results for input(s): CKTOTAL, CKMB, CKMBINDEX, TROPONINI in the last 168 hours.  HbA1C: Hgb A1c MFr Bld  Date/Time Value Ref Range Status  06/23/2020 10:34 AM 5.6 4.6 - 6.5 % Final    Comment:    Glycemic Control Guidelines for People with Diabetes:Non Diabetic:  <6%Goal of Therapy: <7%Additional Action Suggested:  >8%     CBG: No results for input(s): GLUCAP in the last 168 hours.   Critical care time: 35 minutes    Freda Jackson, MD [facility] Office: [phone]   See Amion for personal pager PCCM on call pager [phone] until 7pm. Please call Elink 7p-7a. [phone]

## 2020-10-12 LAB — PHOSPHORUS: Phosphorus: 4.3 mg/dL (ref 2.5–4.6)

## 2020-10-12 LAB — CBC WITH DIFFERENTIAL/PLATELET
Abs Immature Granulocytes: 0.02 10*3/uL (ref 0.00–0.07)
Basophils Absolute: 0 10*3/uL (ref 0.0–0.1)
Basophils Relative: 0 %
Eosinophils Absolute: 0.3 10*3/uL (ref 0.0–0.5)
Eosinophils Relative: 7 %
HCT: 33.6 % — ABNORMAL LOW (ref 39.0–52.0)
Hemoglobin: 10.8 g/dL — ABNORMAL LOW (ref 13.0–17.0)
Immature Granulocytes: 0 %
Lymphocytes Relative: 23 %
Lymphs Abs: 1 10*3/uL (ref 0.7–4.0)
MCH: 29 pg (ref 26.0–34.0)
MCHC: 32.1 g/dL (ref 30.0–36.0)
MCV: 90.3 fL (ref 80.0–100.0)
Monocytes Absolute: 0.5 10*3/uL (ref 0.1–1.0)
Monocytes Relative: 10 %
Neutro Abs: 2.8 10*3/uL (ref 1.7–7.7)
Neutrophils Relative %: 60 %
Platelets: 150 10*3/uL (ref 150–400)
RBC: 3.72 MIL/uL — ABNORMAL LOW (ref 4.22–5.81)
RDW: 13.5 % (ref 11.5–15.5)
WBC: 4.6 10*3/uL (ref 4.0–10.5)
nRBC: 0 % (ref 0.0–0.2)

## 2020-10-12 LAB — COMPREHENSIVE METABOLIC PANEL
ALT: 56 U/L — ABNORMAL HIGH (ref 0–44)
AST: 51 U/L — ABNORMAL HIGH (ref 15–41)
Albumin: 3.5 g/dL (ref 3.5–5.0)
Alkaline Phosphatase: 55 U/L (ref 38–126)
Anion gap: 8 (ref 5–15)
BUN: 11 mg/dL (ref 6–20)
CO2: 22 mmol/L (ref 22–32)
Calcium: 8.9 mg/dL (ref 8.9–10.3)
Chloride: 110 mmol/L (ref 98–111)
Creatinine, Ser: 1.04 mg/dL (ref 0.61–1.24)
GFR, Estimated: >60 mL/min (ref >60)
Glucose, Bld: 100 mg/dL — ABNORMAL HIGH (ref 70–99)
Potassium: 3.6 mmol/L (ref 3.5–5.1)
Sodium: 140 mmol/L (ref 135–145)
Total Bilirubin: 0.9 mg/dL (ref 0.3–1.2)
Total Protein: 6.7 g/dL (ref 6.5–8.1)

## 2020-10-12 LAB — MAGNESIUM: Magnesium: 1.7 mg/dL (ref 1.7–2.4)

## 2020-10-12 MED ORDER — CHLORDIAZEPOXIDE HCL 25 MG PO CAPS
25.0000 mg | ORAL_CAPSULE | Freq: Three times a day (TID) | ORAL | Status: DC
  Administered 2020-10-12 (×3): 25 mg via ORAL
  Filled 2020-10-12 (×3): qty 1

## 2020-10-12 MED ORDER — AMOXICILLIN-POT CLAVULANATE 875-125 MG PO TABS
1.0000 | ORAL_TABLET | Freq: Two times a day (BID) | ORAL | Status: DC
  Administered 2020-10-12 (×2): 1 via ORAL
  Filled 2020-10-12 (×2): qty 1

## 2020-10-12 MED ORDER — POTASSIUM CHLORIDE 20 MEQ PO PACK
40.0000 meq | PACK | Freq: Once | ORAL | Status: AC
  Administered 2020-10-12: 40 meq via ORAL
  Filled 2020-10-12: qty 2

## 2020-10-12 MED ORDER — IVABRADINE HCL 5 MG PO TABS
5.0000 mg | ORAL_TABLET | Freq: Two times a day (BID) | ORAL | Status: DC
  Administered 2020-10-12 (×2): 5 mg via ORAL
  Filled 2020-10-12 (×3): qty 1

## 2020-10-12 MED ORDER — MAGNESIUM SULFATE 2 GM/50ML IV SOLN
2.0000 g | Freq: Once | INTRAVENOUS | Status: AC
  Administered 2020-10-12: 2 g via INTRAVENOUS
  Filled 2020-10-12: qty 50

## 2020-10-12 NOTE — Progress Notes (Signed)
[facility] Progress Note  Patient: Robert [name]    LKG:[id number]  DOA: 10/08/2020     Date of Service: the patient was seen and examined on 10/12/2020  Brief hospital course: Past medical history of alcohol abuse, cocaine abuse, chronic systolic CHF, NICM, active smoker, marijuana abuse, history of PE and gastritis asthma.  Presents with complaints of left-sided chest pain found left-sided pneumatocele was started on IV antibiotics.  During the course of hospitalization developed alcohol withdrawal symptoms and delirium tremens requiring Precedex. PCCM currently following. Currently plan is monitor for improvement in mentation and continue antibiotics.  Assessment and Plan: 1.  Severe sepsis secondary to aspiration pneumonia Possible infected pneumatocele Met SIRS criteria on admission with tachycardia, tachypnea and lactic acidosis. Currently not hypoxic or hypotensive.  Afebrile. CT angio chest negative for pulmonary embolism but showing left lower lobe pneumatosis a with layering fluid. Started on IV antibiotics currently on IV Unasyn.  Switch to oral Augmentin. Blood culture negative for 48 hours.  MRSA PCR negative. COVID-19 and influenza negative. Continue antibiotics for total of 7 days.  2.  Alcohol withdrawal syndrome with delirium tremens Required transfer to the ICU. Drinking alcohol since age 36 per records. Also has history of withdrawal seizures. Patient was on CIWA protocol with IV Ativan but continues to remain significantly agitated.  Requiring Precedex infusion started on 5/31. PCCM managing.   Precedex weaned off on 6/2. Currently on IV Valium as needed and Librium scheduled.   Monitor overnight for improvement in benzodiazepine requirement.  3.  Chronic systolic CHF/NICM Sinus bradycardia Followed by cardiology/heart failure clinic. Likely in the setting of alcohol and cocaine abuse. On Farxiga, Entresto, carvedilol, digoxin, Aldactone and  Corlanor. Digoxin level 0.2.  4.  Dental caries CT maxillofacial ruled out abscess. Currently on IV antibiotics.  Outpatient follow-up with dentistry.  5.  Nausea vomiting and abdominal pain Elevated LFTs LFT elevated chronically in the setting of alcohol abuse. Likely is normal. CT abdomen without any acute finding. Nausea vomiting resolved. Continue Protonix and Carafate.  6.  Mood disorder. On Seroquel currently on hold.  7.  Prolonged QTC. Appears to be chronically prolonged. Monitor.  Will recheck EKG.  8.  AKI Hypokalemia hypomagnesemia. Currently being corrected. Baseline serum creatinine less than 1.  Creatinine went as high as 1.35.  Currently back to normal. Monitor.  9.  Lactic acidosis. Secondary to agitation and liver abnormality. Currently improving.  Monitor.  Diet: Regular diet DVT Prophylaxis:   enoxaparin (LOVENOX) injection 40 mg Start: 10/08/20 2200    Advance goals of care discussion: Full code  Family Communication: family was present at bedside, at the time of interview.  The pt provided permission to discuss medical plan with the family. Opportunity was given to ask question and all questions were answered satisfactorily.   Disposition:  Status is: Inpatient  Remains inpatient appropriate because:Altered mental status Monitor overnight for benzodiazepine requirement.  Dispo: The patient is from: Home              Anticipated d/c is to: Home              Patient currently is not medically stable to d/c.   Difficult to place patient No  Subjective: No acute complaint.  Agitation under control.  No nausea no vomiting.  Tolerating oral diet.  Physical Exam:  General: Appear in mild distress, no Rash; Oral Mucosa Clear, moist. no Abnormal Neck Mass Or lumps, Conjunctiva normal  Cardiovascular: S1 and S2 Present, no  Murmur, Respiratory: good respiratory effort, Bilateral Air entry present and CTA, no Crackles, no wheezes Abdomen: Bowel  Sound present, Soft and no tenderness Extremities: no Pedal edema Neurology: alert and oriented to time, place, and person affect appropriate. no new focal deficit Gait not checked due to patient safety concerns   Vitals:   10/12/20 0806 10/12/20 0900 10/12/20 1000 10/12/20 1148  BP:  120/83 (!) 145/97   Pulse:  88 85   Resp:  18 20   Temp: 97.7 F (36.5 C)   98.5 F (36.9 C)  TempSrc: Oral   Oral  SpO2:  100% 100%   Weight:      Height:        Intake/Output Summary (Last 24 hours) at 10/12/2020 1511 Last data filed at 10/12/2020 0000 Gross per 24 hour  Intake 328.85 ml  Output 900 ml  Net -571.15 ml   Filed Weights   10/08/20 1618 10/10/20 1050  Weight: 84.4 kg 86.8 kg    Data Reviewed: I have personally reviewed and interpreted daily labs, tele strips, imaging. I reviewed all nursing notes, pharmacy notes, vitals, pertinent old records I have discussed plan of care as described above with RN and patient/family.  CBC: Recent Labs  Lab 10/08/20 1654 10/10/20 0418 10/11/20 0301 10/12/20 0248  WBC 6.0 3.1* 4.5 4.6  NEUTROABS 4.4 1.8 3.0 2.8  HGB 13.1 9.9* 10.7* 10.8*  HCT 39.3 30.4* 32.4* 33.6*  MCV 88.9 90.2 90.5 90.3  PLT 215 134* 137* 494   Basic Metabolic Panel: Recent Labs  Lab 10/08/20 1654 10/08/20 1819 10/09/20 0535 10/09/20 0908 10/10/20 0418 10/11/20 0301 10/11/20 0800 10/12/20 0248  NA 142  --  137  --  136 143  --  140  K 3.7  --  3.7  --  3.7 3.5  --  3.6  CL 100  --  104  --  105 109  --  110  CO2 18*  --  26  --  24 20*  --  22  GLUCOSE 114*  --  137*  --  109* 92  --  100*  BUN 14  --  12  --  9 9  --  11  CREATININE 1.35*  --  1.05  --  1.23 0.99  --  1.04  CALCIUM 9.3  --  8.3*  --  8.5* 8.9  --  8.9  MG  --  1.4*  --  1.0* 2.1  --  1.7 1.7  PHOS  --   --  3.0  --   --   --  3.6 4.3    Studies: No results found.  Scheduled Meds: . amoxicillin-clavulanate  1 tablet Oral Q12H  . carvedilol  3.125 mg Oral BID  . chlordiazePOXIDE   25 mg Oral TID  . Chlorhexidine Gluconate Cloth  6 each Topical Q0600  . digoxin  0.125 mg Oral Daily  . enoxaparin (LOVENOX) injection  40 mg Subcutaneous Q24H  . folic acid  1 mg Oral Daily  . ivabradine  5 mg Oral BID WC  . mouth rinse  15 mL Mouth Rinse BID  . multivitamin with minerals  1 tablet Oral Daily  . nicotine  21 mg Transdermal Daily  . pantoprazole  40 mg Oral QHS  . sacubitril-valsartan  1 tablet Oral BID  . spironolactone  25 mg Oral Daily  . sucralfate  1 g Oral TID WC & HS  . thiamine  100 mg Oral Daily  Or  . thiamine  100 mg Intravenous Daily   Continuous Infusions:  PRN Meds: albuterol, benzocaine, diazepam, haloperidol lactate, hydrALAZINE, labetalol, LORazepam  Time spent: 35 minutes  Author: Berle Mull, MD [facility] 10/12/2020 3:11 PM  To reach On-call, see care teams to locate the attending and reach out via [email or web address]. Between 7PM-7AM, please contact night-coverage If you still have difficulty reaching the attending provider, please page the [facility] (Director on Call) for [facility] on amion for assistance.

## 2020-10-12 NOTE — Consult Note (Signed)
NAME:  Robert [name], MRN:  [id number], DOB:  1981/03/12, LOS: 4 ADMISSION DATE:  10/08/2020, CONSULTATION DATE:  5/31 REFERRING MD:  Dr. Doristine Bosworth, CHIEF COMPLAINT:  ETOH withdrawal  History of Present Illness:  Patient is encephalopathic and/or intubated. Therefore history has been obtained from chart review.  40 year old male with PMH as below, which is significant for HFrEF, ETOH abuse, and cocaine abuse. First documented with CHF in 09/2019 at which point LVEF was 15% with noncompaction and felt to be secondary to substance abuse and CAD workup was negative at the time. He is managed in the heart failure clinic and has had recovery of his EF to 30%. He has had several encounters with ED and admission since his initial diagnosis, most notable for alcohol withdrawal including seizure. He presented to [facility] ED 5/29 with complaints of left sided chest pain/LUQ pain. Workup in the ED included CT chest, which showed pneumatocele in the left lower lobe. He was started on empiric antibiotics and admitted to the hospitalist team.   His course then became complicated by delirium concerning for alcohol withdrawal. ETOH level was 188 on presentation. Despite benzodiazepine administration the patient had a consistently elevated CIWA score and was increasingly posing a risk to himself. He was transferred to the ICU on 5/31 and PCCM was consulted for assistance managing withdrawal symptoms.   Pertinent  Medical History   has a past medical history of Alcohol use, Alcohol withdrawal ([place]) (03/06/2020), Asthma, and CHF (congestive heart failure) ([place]).   Significant Hospital Events: Including procedures, antibiotic start and stop dates in addition to other pertinent events   . CTA chest: no PE, pneumatocele left lower lobe with air/fluid level.  . CT abdomen/pelvis: profound hepatic steatosis. No etiology for abdominal pain found.  . CT maxillofacial: severe dental caries. No abscess identified.   . 5/29 admit . 5/31 tx to ICU for agitated delirium, started on precedex . 6/1 continued on precedex  Interim History / Subjective:   No acute events overnight.   Patient awake and alert this morning. No complaints. He remains on precedex at this time.  Objective   Blood pressure 120/83, pulse 88, temperature 97.7 F (36.5 C), temperature source Oral, resp. rate 18, height 5\' 10"  (1.778 m), weight 86.8 kg, SpO2 100 %.        Intake/Output Summary (Last 24 hours) at 10/12/2020 1021 Last data filed at 10/12/2020 0000 Gross per 24 hour  Intake 617.33 ml  Output 900 ml  Net -282.67 ml   Filed Weights   10/08/20 1618 10/10/20 1050  Weight: 84.4 kg 86.8 kg    Examination: General: middle aged male, no acute distress HENT: [place]/AT, PERRL, no JVD. Poor dentition Lungs: Clear bilateral breath sounds Cardiovascular: rrr, no murmurs Abdomen: Soft, non-tender, non-distended Extremities: no edema, warm Neuro: awake, alert, following commands   Labs/imaging that I have personally reviewed  (right click and "Reselect all SmartList Selections" daily)  CT Chest 5/29 reviewed, cystic airway of the left lower lobe with air fluid level concerning for infection.   6/2 K 3.6, Mg 1.7, Cr 1.04  Resolved Hospital Problem list     Assessment & Plan:   Alcohol withdrawal complicated by delirium tremens: patient has significant history of ETOH since age 3 per records with recent history of withdrawal seizures. Describes recent use as several "airplane bottles" per day. He has failed traditional CIWA protocol and has been transferred to ICU. He also has a history of cocaine use.  Tox screen positive for only THC.  - withdrawal symptoms improved today, will start librium 25mg  TID scheduled and wean off precedex drip.  - If he does well of precedex he will safe for transfer out of the ICU - PRN ativan, valium, haldol. QTC monitoring. Hold haldol for QTc > 500. - Thiamine, folate, MVI  Sepsis  secondary to cavitary pneumonia in the left lower lobe: presumably secondary to poor dentition +/- aspiration in the setting of alcohol use. Dental abscess ruled out by CT.  - continue unasyn while NPO   - plan for 4-6 week course with outpatient follow up and repeat imaging.  - Blood cultures pending - Needs outpatient dental follow up  Chronic HFrEF: secondary to NICM in the setting of cocaine and alcohol abuse. Followed by CHF clinic. LVEF 30%. - Continue entresto, carvedilol, digoxin, aldactone, and ivabradine when he is able to take PO - If he is unable, then we will need to hold these and consider cardiology consultatoin.  - dig level 0.2  Gastritis: - PPI continue - Sucralfate  Hypomagnesemia: resolved   Best practice (right click and "Reselect all SmartList Selections" daily)  Diet:  Oral  Pain/Anxiety/Delirium protocol (if indicated): No VAP protocol (if indicated): Not indicated DVT prophylaxis: LMWH GI prophylaxis: PPI Glucose control:  SSI No Central venous access:  N/A Arterial line:  N/A Foley:  N/A Mobility:  OOB  PT consulted: N/A Last date of multidisciplinary goals of care discussion [mother updated at bedside 5/31 ] Code Status:  full code Disposition: Possible transfer to progressive care unit  Labs   CBC: Recent Labs  Lab 10/08/20 1654 10/10/20 0418 10/11/20 0301 10/12/20 0248  WBC 6.0 3.1* 4.5 4.6  NEUTROABS 4.4 1.8 3.0 2.8  HGB 13.1 9.9* 10.7* 10.8*  HCT 39.3 30.4* 32.4* 33.6*  MCV 88.9 90.2 90.5 90.3  PLT 215 134* 137* 676    Basic Metabolic Panel: Recent Labs  Lab 10/08/20 1654 10/08/20 1819 10/09/20 0535 10/09/20 0908 10/10/20 0418 10/11/20 0301 10/11/20 0800 10/12/20 0248  NA 142  --  137  --  136 143  --  140  K 3.7  --  3.7  --  3.7 3.5  --  3.6  CL 100  --  104  --  105 109  --  110  CO2 18*  --  26  --  24 20*  --  22  GLUCOSE 114*  --  137*  --  109* 92  --  100*  BUN 14  --  12  --  9 9  --  11  CREATININE 1.35*  --   1.05  --  1.23 0.99  --  1.04  CALCIUM 9.3  --  8.3*  --  8.5* 8.9  --  8.9  MG  --  1.4*  --  1.0* 2.1  --  1.7 1.7  PHOS  --   --  3.0  --   --   --  3.6  --    GFR: Estimated Creatinine Clearance: 98.5 mL/min (by C-G formula based on SCr of 1.04 mg/dL). Recent Labs  Lab 10/08/20 1654 10/08/20 1854 10/09/20 0908 10/10/20 0418 10/11/20 0301 10/12/20 0248  WBC 6.0  --   --  3.1* 4.5 4.6  LATICACIDVEN 8.8* 4.7* 1.6  --   --   --     Liver Function Tests: Recent Labs  Lab 10/08/20 1654 10/10/20 0418 10/11/20 0301 10/12/20 0248  AST 99* 57* 87* 51*  ALT 74* 44 61* 56*  ALKPHOS 55 51 45 55  BILITOT 0.7 0.3 1.2 0.9  PROT 8.5* 6.4* 6.5 6.7  ALBUMIN 4.9 3.6 3.6 3.5   Recent Labs  Lab 10/08/20 1654  LIPASE 25   No results for input(s): AMMONIA in the last 168 hours.  ABG    Component Value Date/Time   PHART 7.486 (H) 10/06/2019 1212   PCO2ART 40.0 10/06/2019 1212   PO2ART 165 (H) 10/06/2019 1212   HCO3 20.9 10/08/2020 1807   TCO2 31 10/06/2019 1212   ACIDBASEDEF 3.5 (H) 10/08/2020 1807   O2SAT 55.4 10/08/2020 1807     Coagulation Profile: No results for input(s): INR, PROTIME in the last 168 hours.  Cardiac Enzymes: No results for input(s): CKTOTAL, CKMB, CKMBINDEX, TROPONINI in the last 168 hours.  HbA1C: Hgb A1c MFr Bld  Date/Time Value Ref Range Status  06/23/2020 10:34 AM 5.6 4.6 - 6.5 % Final    Comment:    Glycemic Control Guidelines for People with Diabetes:Non Diabetic:  <6%Goal of Therapy: <7%Additional Action Suggested:  >8%     CBG: No results for input(s): GLUCAP in the last 168 hours.   Critical care time: 35 minutes    Freda Jackson, MD [facility] Office: [phone]   See Amion for personal pager PCCM on call pager [phone] until 7pm. Please call Elink 7p-7a. [phone]

## 2020-10-13 LAB — CBC WITH DIFFERENTIAL/PLATELET
Abs Immature Granulocytes: 0.04 10*3/uL (ref 0.00–0.07)
Basophils Absolute: 0 10*3/uL (ref 0.0–0.1)
Basophils Relative: 0 %
Eosinophils Absolute: 0.2 10*3/uL (ref 0.0–0.5)
Eosinophils Relative: 3 %
HCT: 37.8 % — ABNORMAL LOW (ref 39.0–52.0)
Hemoglobin: 11.8 g/dL — ABNORMAL LOW (ref 13.0–17.0)
Immature Granulocytes: 1 %
Lymphocytes Relative: 20 %
Lymphs Abs: 1.1 10*3/uL (ref 0.7–4.0)
MCH: 29.7 pg (ref 26.0–34.0)
MCHC: 31.2 g/dL (ref 30.0–36.0)
MCV: 95.2 fL (ref 80.0–100.0)
Monocytes Absolute: 0.7 10*3/uL (ref 0.1–1.0)
Monocytes Relative: 13 %
Neutro Abs: 3.4 10*3/uL (ref 1.7–7.7)
Neutrophils Relative %: 63 %
Platelets: 161 10*3/uL (ref 150–400)
RBC: 3.97 MIL/uL — ABNORMAL LOW (ref 4.22–5.81)
RDW: 14 % (ref 11.5–15.5)
WBC: 5.4 10*3/uL (ref 4.0–10.5)
nRBC: 0 % (ref 0.0–0.2)

## 2020-10-13 LAB — COMPREHENSIVE METABOLIC PANEL
ALT: 44 U/L (ref 0–44)
AST: 35 U/L (ref 15–41)
Albumin: 3.6 g/dL (ref 3.5–5.0)
Alkaline Phosphatase: 48 U/L (ref 38–126)
Anion gap: 7 (ref 5–15)
BUN: 7 mg/dL (ref 6–20)
CO2: 24 mmol/L (ref 22–32)
Calcium: 8.8 mg/dL — ABNORMAL LOW (ref 8.9–10.3)
Chloride: 105 mmol/L (ref 98–111)
Creatinine, Ser: 1.04 mg/dL (ref 0.61–1.24)
GFR, Estimated: >60 mL/min (ref >60)
Glucose, Bld: 98 mg/dL (ref 70–99)
Potassium: 4.1 mmol/L (ref 3.5–5.1)
Sodium: 136 mmol/L (ref 135–145)
Total Bilirubin: 0.6 mg/dL (ref 0.3–1.2)
Total Protein: 6.8 g/dL (ref 6.5–8.1)

## 2020-10-13 MED ORDER — NICOTINE 21 MG/24HR TD PT24: 21.0000 mg | MEDICATED_PATCH | Freq: Every day | TRANSDERMAL | 0 refills | Status: DC

## 2020-10-13 MED ORDER — GABAPENTIN 600 MG PO TABS: ORAL_TABLET | ORAL | 0 refills | Status: DC

## 2020-10-13 MED ORDER — AMOXICILLIN-POT CLAVULANATE 875-125 MG PO TABS: 1.0000 | ORAL_TABLET | Freq: Two times a day (BID) | ORAL | 0 refills | Status: AC

## 2020-10-13 MED ORDER — BUSPIRONE HCL 5 MG PO TABS: 5.0000 mg | ORAL_TABLET | Freq: Three times a day (TID) | ORAL | 0 refills | Status: DC

## 2020-10-13 NOTE — Discharge Instructions (Signed)
Alcohol Withdrawal Syndrome When a person who drinks a lot of alcohol stops drinking, he or she may have unpleasant and serious symptoms. These symptoms are called alcohol withdrawal syndrome. This condition may be mild or severe. It can be life-threatening. It can cause:  Shaking that you cannot control (tremor).  Sweating.  Headache.  Feeling fearful, upset, grouchy, or depressed.  Trouble sleeping (insomnia).  Nightmares.  Fast or uneven heartbeats (palpitations).  Alcohol cravings.  Feeling sick to your stomach (nausea).  Throwing up (vomiting).  Being bothered by light and sounds.  Confusion.  Trouble thinking clearly.  Not being hungry (loss of appetite).  Big changes in mood (mood swings). If you have all of the following symptoms at the same time, get help right away:  High blood pressure.  Fast heartbeat.  Trouble breathing.  Seizures.  Seeing, hearing, feeling, smelling, or tasting things that are not there (hallucinations). These symptoms are known as delirium tremens (DTs). They must be treated at the hospital right away. Follow these instructions at home:  Take over-the-counter and prescription medicines only as told by your doctor. This includes vitamins.  Do not drink alcohol.  Do not drive until your doctor says that this is safe for you.  Have someone stay with you or be available in case you need help. This should be someone you trust. This person can help you with your symptoms. He or she can also help you to not drink.  Drink enough fluid to keep your pee (urine) pale yellow.  Think about joining a support group or a treatment program to help you stop drinking.  Keep all follow-up visits as told by your doctor. This is important.   Contact a doctor if:  Your symptoms get worse.  You cannot eat or drink without throwing up.  You have a hard time not drinking alcohol.  You cannot stop drinking alcohol. Get help right away  if:  You have fast or uneven heartbeats.  You have chest pain.  You have trouble breathing.  You have a seizure for the first time.  You see, hear, feel, smell, or taste something that is not there.  You get very confused. Summary  When a person who drinks a lot of alcohol stops drinking, he or she may have serious symptoms. This is called alcohol withdrawal syndrome.  Delirium tremens (DTs) is a group of life-threatening symptoms. You should get help right away if you have these symptoms.  Think about joining an alcohol support group or a treatment program. This information is not intended to replace advice given to you by your health care provider. Make sure you discuss any questions you have with your health care provider. Document Revised: 04/11/2017 Document Reviewed: 01/03/2017 Elsevier Patient Education  2021 [employer]  

## 2020-10-13 NOTE — TOC Transition Note (Signed)
Transition of Care [facility]) - CM/SW Discharge Note   Patient Details  Name: Robert [name] MRN: [id number] Date of Birth: July 27, 1980  [facility] CM/SW Contact:  Leeroy Cha, RN Phone Number: 10/13/2020, 8:52 AM   Clinical Narrative:    Dc to home with self care no toc needs.   Final next level of care: Home/Self Care Barriers to Discharge: No Barriers Identified   Patient Goals and CMS Choice Patient states their goals for this hospitalization and ongoing recovery are:: to go home CMS Medicare.gov Compare Post Acute Care list provided to:: Patient Choice offered to / list presented to : Patient  Discharge Placement                       Discharge Plan and Services   Discharge Planning Services: CM Consult                                 Social Determinants of Health (SDOH) Interventions     Readmission Risk Interventions Readmission Risk Prevention Plan 07/11/2020  Transportation Screening Complete  Medication Review [employer]) Complete  PCP or Specialist appointment within 3-5 days of discharge Complete  HRI or [place] Complete  SW Recovery Care/Counseling Consult Complete  [place] Not Applicable  Some recent data might be hidden

## 2020-10-13 NOTE — Progress Notes (Signed)
Patient anxious for discharge. Upon meeting patient stated,"oh, I'm leaving today!". RN sent message to Dr. Posey Pronto that patient requesting to discharge vs leaving AMA. Dr. Posey Pronto saw patient, orders received for Discharge. Paperwork provided and discussed with patient. IV removed. RN took patient to main entrance via wheelchair at 0830.

## 2020-10-14 LAB — CULTURE, BLOOD (ROUTINE X 2)
Culture: NO GROWTH
Culture: NO GROWTH
Special Requests: ADEQUATE

## 2020-10-16 ENCOUNTER — Ambulatory Visit (HOSPITAL_COMMUNITY): Payer: BC Managed Care – PPO

## 2020-10-16 NOTE — Discharge Summary (Signed)
[facility] Discharge Summary   Patient: Robert [name] KGM:[id number]  PCP: Flossie Buffy, NP  Date of admission: 10/08/2020   Date of discharge: 10/13/2020     Discharge Diagnoses:  Principal Problem:   Pneumonia Active Problems:   Chronic systolic CHF (congestive heart failure) (HCC)   AKI (acute kidney injury) ([place])   Nausea & vomiting   Severe sepsis ([place])   Sepsis without acute organ dysfunction ([place])  Admitted From: home Disposition:  Home   Recommendations for Outpatient Follow-up:  1. PCP: follow up with PCP in 1 week   Follow-up Information    Nche, Charlene Brooke, NP. Schedule an appointment as soon as possible for a visit in 1 week(s).   Specialty: Internal Medicine Contact information: [place] [address] [phone]              Discharge Instructions    Diet - low sodium heart healthy   Complete by: As directed    Discharge instructions   Complete by: As directed    It is important that you read the instructions as well as go over your medication list with RN to help you understand your care after this hospitalization.  Please follow-up with PCP in 1-2 weeks.  Please note that we are unable to authorize any refills for discharge medications, once you are discharged. Thus, it is imperative that you return to your primary care physician (or establish a relationship with a primary care physician if you do not have one) for your care needs. So that they can reassess your need for medications and monitor your lab values.  Please request your primary care physician to go over all Hospital Tests and Procedure/Radiological results at the follow up. Please get all Hospital records sent to your PCP by signing hospital release before you go home.   Do not drive, operating heavy machinery, perform activities at heights, swimming or participation in water activities or provide baby sitting services; until you have been seen by  Primary Care Physician and are cleared to do such activities.  Do not take more than prescribed Pain, Sleep and Anxiety Medications.  You were cared for by a hospitalist during your hospital stay. If you have any questions about your discharge medications or the care you received while you were in the hospital after you are discharged, you can call the hospital unit/floor you were admitted to and ask to speak with the hospitalist who took care of you. Ask for Hospitalist on call, if the hospitalist that took care of you is not available. Once you are discharged, your primary care physician will help you with any further medical issues. You Must read complete instructions/literature along with all the possible adverse reactions/side effects for all the Medicines you take and that have been prescribed to you. Take any new Medicines after you have completely understood and accept all the possible adverse reactions/side effects. If you have smoked or chewed Tobacco, please STOP smoking. please STOP any Recreational drug use. If you drink alcohol, please safely STOP the use. Do not drive, operating heavy machinery, perform activities at heights, swimming or participation in water activities or provide baby sitting services under influence.   Increase activity slowly   Complete by: As directed       Diet recommendation: Cardiac diet  Activity: The patient is advised to gradually reintroduce usual activities, as tolerated  Discharge Condition: stable  Code Status: Full code   History of present illness: As  per the H and P dictated on admission, "[name] is a 40 y.o. male with medical history significant of chronic systolic CHF/nonischemic cardiomyopathy (EF 30% on echo done February 2022), left ventricular noncompaction, history of PE, asthma, gastritis, mood disorder, polysubstance abuse (alcohol, cocaine, marijuana, tobacco), hospital admission last month for severe alcohol  withdrawal/delirium tremens.  He presents to the ED today with complaints of left-sided chest pain/left upper quadrant abdominal pain, shortness of breath, cough, nausea, and vomiting.  In the ED, patient was tachycardic and tachypneic.  Afebrile.  Not hypotensive.  Not hypoxic.  Labs showing WBC 6.0, hemoglobin 13.1, platelet count 215K.  Sodium 142, potassium 3.7, chloride 100, bicarb 18, anion gap 24, BUN 14, creatinine 1.3 (baseline 1.0), glucose 114.  AST 99, ALT 74 (chronically elevated).  Alk phos and T bili normal.  Lipase normal.  UA and UDS pending.  Blood ethanol level 180.  High sensitive troponin negative x2.  EKG without acute ischemic changes.  BNP 202.  COVID and influenza PCR negative.  D-dimer 1.0.  Lactic acid 8.8.  VBG showing pH 7.36.  Blood culture x2 pending.  Chest x-ray showing no acute cardiopulmonary process.  CT angiogram chest negative for PE.  Showing a pneumatocele of the inferior left lower lobe with layering frothy fluid suspicious for superimposed infection.  CT abdomen pelvis showing profound hepatic steatosis but no acute abnormality.  Antibiotics ordered and patient was 30 cc/kg fluid boluses per sepsis protocol.  He was started on CIWA protocol.  Lactate improved to 4.7 after fluid boluses.  Patient reports not feeling well for the past few days.  He is feeling weak, coughing up clear sputum, and experiencing left-sided lower chest/upper abdominal pain.  He has been vomiting for the past 2 days and has not been able to eat or consume any alcohol.  He normally drinks several airplane bottles of vodka daily.  He is having heartburn.  No additional history could be obtained from him."  Hospital Course:  Summary of his active problems in the hospital is as following. 1. Severe sepsis secondary to aspiration pneumonia Possible infected pneumatocele Met SIRS criteria on admission with tachycardia, tachypnea and lactic acidosis. Currently not hypoxic or hypotensive.  Afebrile. CT angio chest negative for pulmonary embolism but showing left lower lobe pneumatosis a with layering fluid. Started on IV antibiotics currently on IV Unasyn.   Switch to oral Augmentin. Blood culture negative for 48 hours. MRSA PCR negative. COVID-19 and influenza negative. Continue antibiotics for total of 7 days.  2. Alcohol withdrawal syndrome with delirium tremens Required transfer to the ICU. Drinking alcohol since age 71 per records. Also has history of withdrawal seizures. Patient was on CIWA protocol with IV Ativan but continues to remain significantly agitated. Requiring Precedex infusion started on 5/31. Precedex weaned off on 6/2. Was on IV Valium as needed and Librium scheduled. Switch to gabapentin.  3. Chronic systolic CHF/NICM Sinus bradycardia Followed by cardiology/heart failure clinic. Likely in the setting of alcohol and cocaine abuse. On Farxiga, Entresto, carvedilol, digoxin, Aldactone and Corlanor. Digoxin level 0.2.  4.  Dental caries CT maxillofacial ruled out abscess. Outpatient follow-up with dentistry.  5.  Nausea vomiting and abdominal pain Elevated LFTs LFT elevated chronically in the setting of alcohol abuse. Likely is normal. CT abdomen without any acute finding. Nausea vomiting resolved. Continue Protonix and Carafate.  6.  Mood disorder. On Seroquel currently on hold.  7.  Prolonged QTC. Appears to be chronically prolonged.  8.  AKI Hypokalemia hypomagnesemia. Currently being corrected. Baseline serum creatinine less than 1. Creatinine went as high as 1.35.   Currently back to normal. Monitor.  9.  Lactic acidosis. Secondary to agitation and liver abnormality. Currently improving.  Monitor.  Patient was ambulatory without any assistance. On the day of the discharge the patient's vitals were stable, and no other new acute medical condition were reported. The patient was felt safe to be discharge at Home with no  therapy needed on discharge.  Consultants: PCCM  Procedures: none  DISCHARGE MEDICATION: Allergies as of 10/13/2020      Reactions   Bactrim [sulfamethoxazole-trimethoprim] Rash      Medication List    STOP taking these medications   QUEtiapine 100 MG tablet Commonly known as: SEROQUEL   sertraline 25 MG tablet Commonly known as: ZOLOFT     TAKE these medications   amoxicillin-clavulanate 875-125 MG tablet Commonly known as: AUGMENTIN Take 1 tablet by mouth 2 (two) times daily for 3 days.   busPIRone 5 MG tablet Commonly known as: BUSPAR Take 1 tablet (5 mg total) by mouth 3 (three) times daily.   carvedilol 3.125 MG tablet Commonly known as: Coreg Take 1 tablet (3.125 mg total) by mouth 2 (two) times daily.   digoxin 0.125 MG tablet Commonly known as: LANOXIN Take 1 tablet (0.125 mg total) by mouth daily.   Entresto 97-103 MG Generic drug: sacubitril-valsartan Take 1 tablet by mouth 2 (two) times daily.   Farxiga 10 MG Tabs tablet Generic drug: dapagliflozin propanediol TAKE 1 TABLET BY MOUTH ONCE DAILY BEFORE BREAKFAST What changed:   how much to take  when to take this   folic acid 1 MG tablet Commonly known as: FOLVITE Take 1 tablet (1 mg total) by mouth daily.   gabapentin 600 MG tablet Commonly known as: Neurontin On Day 1, take 600 mg every 6 hours (2400 mg total daily dose),  On Day 2, take 600 mg every 8 hours (1800 mg total daily dose), On Day 3, take 600 mg every 12 hours (1200 mg total daily dose), From Day 4 , take 600 mg once at night (600 mg total daily dose) and stop.   ivabradine 7.5 MG Tabs tablet Commonly known as: CORLANOR Take 1 tablet (7.5 mg total) by mouth 2 (two) times daily with a meal.   LORazepam 1 MG tablet Commonly known as: ATIVAN Take 1 tablet (1 mg total) by mouth every 6 (six) hours as needed for anxiety.   magnesium oxide 400 MG tablet Commonly known as: MAG-OX Take 400 mg by mouth daily.   nicotine 21 mg/24hr  patch Commonly known as: NICODERM CQ - dosed in mg/24 hours Place 1 patch (21 mg total) onto the skin daily.   ondansetron 4 MG disintegrating tablet Commonly known as: Zofran ODT Take 1 tablet (4 mg total) by mouth every 8 (eight) hours as needed for nausea or vomiting.   pantoprazole 40 MG tablet Commonly known as: PROTONIX Take 1 tablet (40 mg total) by mouth daily.   potassium chloride 20 MEQ packet Commonly known as: KLOR-CON Take 20 mEq by mouth daily.   spironolactone 25 MG tablet Commonly known as: ALDACTONE Take 1 tablet (25 mg total) by mouth daily. HOLD until 03/26/20 What changed: additional instructions   sucralfate 1 g tablet Commonly known as: Carafate Take 1 tablet (1 g total) by mouth 4 (four) times daily -  with meals and at bedtime.   thiamine 100 MG tablet Take 1 tablet (  100 mg total) by mouth daily.       Discharge Exam: Filed Weights   10/08/20 1618 10/10/20 1050  Weight: 84.4 kg 86.8 kg   Vitals:   10/13/20 0646 10/13/20 0800  BP: (!) 170/121 (!) 155/95  Pulse: (!) 130 (!) 103  Resp:    Temp:  98.1 F (36.7 C)  SpO2: (!) 55% 99%   General: Appear in mild distress, no Rash; Oral Mucosa Clear, moist. no Abnormal Neck Mass Or lumps, Conjunctiva normal  Cardiovascular: S1 and S2 Present, no Murmur, Respiratory: good respiratory effort, Bilateral Air entry present and CTA, no Crackles, no wheezes Abdomen: Bowel Sound present, Soft and no tenderness Extremities: no Pedal edema Neurology: alert and oriented to time, place, and person affect appropriate. no new focal deficit Gait not checked due to patient safety concerns  The results of significant diagnostics from this hospitalization (including imaging, microbiology, ancillary and laboratory) are listed below for reference.    Significant Diagnostic Studies: DG Chest 2 View  Result Date: 10/01/2020 CLINICAL DATA:  Shortness of breath EXAM: CHEST - 2 VIEW COMPARISON:  09/16/2020 FINDINGS:  The heart size and mediastinal contours are within normal limits. Both lungs are clear. The visualized skeletal structures are unremarkable. IMPRESSION: No active cardiopulmonary disease. Electronically Signed   By: Davina Poke D.O.   On: 10/01/2020 19:41   DG Chest 2 View  Result Date: 09/16/2020 CLINICAL DATA:  Chest pain EXAM: CHEST - 2 VIEW COMPARISON:  09/11/2020 FINDINGS: Cardiac shadow is stable. The lungs are clear bilaterally. No acute bony abnormality is noted. IMPRESSION: No acute abnormality seen. Electronically Signed   By: Inez Catalina M.D.   On: 09/16/2020 23:11   CT Angio Chest PE W/Cm &/Or Wo Cm  Result Date: 10/08/2020 CLINICAL DATA:  Shortness of breath, abdominal pain EXAM: CT ANGIOGRAPHY CHEST CT ABDOMEN AND PELVIS WITH CONTRAST TECHNIQUE: Multidetector CT imaging of the chest was performed using the standard protocol during bolus administration of intravenous contrast. Multiplanar CT image reconstructions and MIPs were obtained to evaluate the vascular anatomy. Multidetector CT imaging of the abdomen and pelvis was performed using the standard protocol during bolus administration of intravenous contrast. CONTRAST:  133m OMNIPAQUE IOHEXOL 350 MG/ML SOLN COMPARISON:  Oct 04, 2019 FINDINGS: CTA CHEST FINDINGS Cardiovascular: Evaluation of the lung bases is limited secondary to respiratory motion. No evidence of acute pulmonary embolism through the segmental pulmonary arteries. Evaluation of the distal arteries is limited secondary to motion. Mild cardiomegaly. No pericardial effusion. Mediastinum/Nodes: Thyroid is unremarkable. No axillary or mediastinal adenopathy. Lungs/Pleura: There is a cystic area of the LEFT lower lobe with internal frothy debris. It measures approximately 19 mm (series 6, image 94). There is an adjacent cystic area superiorly which measures approximately 13 mm. LEFT lower lobe atelectasis. Musculoskeletal: No acute osseous abnormality. Review of the MIP images  confirms the above findings. CT ABDOMEN and PELVIS FINDINGS Hepatobiliary: Hepatic steatosis. Gallbladder is unremarkable. No intrahepatic or extrahepatic biliary ductal dilation. Portal vein is patent. Pancreas: Unremarkable. No pancreatic ductal dilatation or surrounding inflammatory changes. Spleen: Normal in size without focal abnormality. Adrenals/Urinary Tract: Adrenal glands are unremarkable. No hydronephrosis. Kidneys enhance symmetrically. Subcentimeter hypodense lesion of the superior pole of the LEFT kidney is too small to accurately characterize. Bladder is unremarkable. Stomach/Bowel: No evidence of bowel obstruction. Appendix is normal. Diffuse submucosal fatty deposition throughout the colon, nonspecific and commonly seen in obesity. Stomach is unremarkable. Vascular/Lymphatic: Mild atherosclerotic calcifications of the aorta. No suspicious lymph nodes. Reproductive:  Prostate is unremarkable. Other: No free air or free fluid. Musculoskeletal: No acute or significant osseous findings. Review of the MIP images confirms the above findings. IMPRESSION: 1. No evidence of acute pulmonary embolism. 2. There is a pneumatocele of the inferior LEFT lower lobe with layering frothy fluid. This may reflect superimposed infection. Recommend correlation with symptomatology. 3. Profound hepatic steatosis. 4. No CT etiology for acute abdominal pain identified. Aortic Atherosclerosis (ICD10-I70.0). Electronically Signed   By: Valentino Saxon MD   On: 10/08/2020 19:02   CT ABDOMEN PELVIS W CONTRAST  Result Date: 10/08/2020 CLINICAL DATA:  Shortness of breath, abdominal pain EXAM: CT ANGIOGRAPHY CHEST CT ABDOMEN AND PELVIS WITH CONTRAST TECHNIQUE: Multidetector CT imaging of the chest was performed using the standard protocol during bolus administration of intravenous contrast. Multiplanar CT image reconstructions and MIPs were obtained to evaluate the vascular anatomy. Multidetector CT imaging of the abdomen and  pelvis was performed using the standard protocol during bolus administration of intravenous contrast. CONTRAST:  125m OMNIPAQUE IOHEXOL 350 MG/ML SOLN COMPARISON:  Oct 04, 2019 FINDINGS: CTA CHEST FINDINGS Cardiovascular: Evaluation of the lung bases is limited secondary to respiratory motion. No evidence of acute pulmonary embolism through the segmental pulmonary arteries. Evaluation of the distal arteries is limited secondary to motion. Mild cardiomegaly. No pericardial effusion. Mediastinum/Nodes: Thyroid is unremarkable. No axillary or mediastinal adenopathy. Lungs/Pleura: There is a cystic area of the LEFT lower lobe with internal frothy debris. It measures approximately 19 mm (series 6, image 94). There is an adjacent cystic area superiorly which measures approximately 13 mm. LEFT lower lobe atelectasis. Musculoskeletal: No acute osseous abnormality. Review of the MIP images confirms the above findings. CT ABDOMEN and PELVIS FINDINGS Hepatobiliary: Hepatic steatosis. Gallbladder is unremarkable. No intrahepatic or extrahepatic biliary ductal dilation. Portal vein is patent. Pancreas: Unremarkable. No pancreatic ductal dilatation or surrounding inflammatory changes. Spleen: Normal in size without focal abnormality. Adrenals/Urinary Tract: Adrenal glands are unremarkable. No hydronephrosis. Kidneys enhance symmetrically. Subcentimeter hypodense lesion of the superior pole of the LEFT kidney is too small to accurately characterize. Bladder is unremarkable. Stomach/Bowel: No evidence of bowel obstruction. Appendix is normal. Diffuse submucosal fatty deposition throughout the colon, nonspecific and commonly seen in obesity. Stomach is unremarkable. Vascular/Lymphatic: Mild atherosclerotic calcifications of the aorta. No suspicious lymph nodes. Reproductive: Prostate is unremarkable. Other: No free air or free fluid. Musculoskeletal: No acute or significant osseous findings. Review of the MIP images confirms the  above findings. IMPRESSION: 1. No evidence of acute pulmonary embolism. 2. There is a pneumatocele of the inferior LEFT lower lobe with layering frothy fluid. This may reflect superimposed infection. Recommend correlation with symptomatology. 3. Profound hepatic steatosis. 4. No CT etiology for acute abdominal pain identified. Aortic Atherosclerosis (ICD10-I70.0). Electronically Signed   By: SValentino SaxonMD   On: 10/08/2020 19:02   DG Chest Port 1 View  Result Date: 10/08/2020 CLINICAL DATA:  [age]year old male with chest pain EXAM: PORTABLE CHEST 1 VIEW COMPARISON:  Chest radiograph dated 10/01/2020. FINDINGS: No focal consolidation, pleural effusion, pneumothorax. Mild cardiomegaly. No acute osseous pathology. IMPRESSION: No acute cardiopulmonary process. Electronically Signed   By: AAnner CreteM.D.   On: 10/08/2020 18:08   CT MAXILLOFACIAL WO CONTRAST  Result Date: 10/09/2020 CLINICAL DATA:  TMJ pain and possible infected tooth EXAM: CT MAXILLOFACIAL WITHOUT CONTRAST TECHNIQUE: Multidetector CT imaging of the maxillofacial structures was performed. Multiplanar CT image reconstructions were also generated. COMPARISON:  None FINDINGS: Osseous: Bony structures show no acute fracture. Mild degenerative  changes in the right temporomandibular joint are noted with flattening of the mandibular condyle. Multiple dental caries are seen. The first molar on the right within the mandible demonstrates significant erosion of the crown and a relatively horizontal appearance. It lies within the gums although no definitive bony anchor is seen. Additionally there is an impacted incisor identified between the root of the central and lateral incisors on the left within the maxilla. No definitive periapical abscess is seen. Orbits: Orbits and their contents are within normal limits. Sinuses: Paranasal sinuses are well aerated. Mucosal retention cyst is noted within the left maxillary antrum. Mild mucosal thickening in  the right maxillary antrum is noted. Soft tissues: No soft tissue swelling or edema is noted. Considerable retained cerumen is noted within the external auditory canals bilaterally. Limited intracranial: Intracranial structures are unremarkable. IMPRESSION: The right first mandibular molar shows erosion of the crown consistent with significant dental caries and a somewhat horizontal position along the mandible within the gum line. No definitive bony anchor is noted. No findings to suggest periapical abscess. Scattered dental caries bilaterally Impacted left incisor as described in the maxilla. Degenerative changes of the right mandibular condyle. Considerable impacted cerumen within the external auditory canal bilaterally. Electronically Signed   By: Inez Catalina M.D.   On: 10/09/2020 16:02    Microbiology: Recent Results (from the past 240 hour(s))  Resp Panel by RT-PCR (Flu A&B, Covid) Nasopharyngeal Swab     Status: None   Collection Time: 10/08/20  4:54 PM   Specimen: Nasopharyngeal Swab; Nasopharyngeal(NP) swabs in vial transport medium  Result Value Ref Range Status   SARS Coronavirus 2 by RT PCR NEGATIVE NEGATIVE Final    Comment: (NOTE) SARS-CoV-2 target nucleic acids are NOT DETECTED.  The SARS-CoV-2 RNA is generally detectable in upper respiratory specimens during the acute phase of infection. The lowest concentration of SARS-CoV-2 viral copies this assay can detect is 138 copies/mL. A negative result does not preclude SARS-Cov-2 infection and should not be used as the sole basis for treatment or other patient management decisions. A negative result may occur with  improper specimen collection/handling, submission of specimen other than nasopharyngeal swab, presence of viral mutation(s) within the areas targeted by this assay, and inadequate number of viral copies(<138 copies/mL). A negative result must be combined with clinical observations, patient history, and  epidemiological information. The expected result is Negative.  Fact Sheet for Patients:  EntrepreneurPulse.com.au  Fact Sheet for Healthcare Providers:  IncredibleEmployment.be  This test is no t yet approved or cleared by the [place] FDA and  has been authorized for detection and/or diagnosis of SARS-CoV-2 by FDA under an Emergency Use Authorization (EUA). This EUA will remain  in effect (meaning this test can be used) for the duration of the COVID-19 declaration under Section 564(b)(1) of the Act, 21 U.S.C.section 360bbb-3(b)(1), unless the authorization is terminated  or revoked sooner.       Influenza A by PCR NEGATIVE NEGATIVE Final   Influenza B by PCR NEGATIVE NEGATIVE Final    Comment: (NOTE) The Xpert Xpress SARS-CoV-2/FLU/RSV plus assay is intended as an aid in the diagnosis of influenza from Nasopharyngeal swab specimens and should not be used as a sole basis for treatment. Nasal washings and aspirates are unacceptable for Xpert Xpress SARS-CoV-2/FLU/RSV testing.  Fact Sheet for Patients: EntrepreneurPulse.com.au  Fact Sheet for Healthcare Providers: IncredibleEmployment.be  This test is not yet approved or cleared by the [place] FDA and has been authorized for detection and/or diagnosis  of SARS-CoV-2 by FDA under an Emergency Use Authorization (EUA). This EUA will remain in effect (meaning this test can be used) for the duration of the COVID-19 declaration under Section 564(b)(1) of the Act, 21 U.S.C. section 360bbb-3(b)(1), unless the authorization is terminated or revoked.  Performed at [facility], [place] [address], [place], [place] [address]   Blood culture (routine x 2)     Status: None   Collection Time: 10/08/20  5:35 PM   Specimen: BLOOD  Result Value Ref Range Status   Specimen Description   Final    BLOOD LEFT ANTECUBITAL Performed at [facility] [address], [place], [place] [address]    Special Requests   Final    BOTTLES DRAWN AEROBIC AND ANAEROBIC Blood Culture adequate volume Performed at [facility] [address], [place], [place] [address]    Culture   Final    NO GROWTH 5 DAYS Performed at [facility], [place] [address], [place], [place] [address]    Report Status 10/14/2020 FINAL  Final  Blood culture (routine x 2)     Status: None   Collection Time: 10/08/20  6:49 PM   Specimen: BLOOD  Result Value Ref Range Status   Specimen Description   Final    BLOOD LEFT ANTECUBITAL Performed at [facility] [address] [place], [place] [address]    Special Requests   Final    BOTTLES DRAWN AEROBIC AND ANAEROBIC Blood Culture results may not be optimal due to an inadequate volume of blood received in culture bottles Performed at [facility] [address], [place], [place] [address]    Culture   Final    NO GROWTH 5 DAYS Performed at [facility], [place] [name], [place], [place] [address]    Report Status 10/14/2020 FINAL  Final  MRSA PCR Screening     Status: None   Collection Time: 10/09/20 12:38 PM   Specimen: Nasopharyngeal  Result Value Ref Range Status   MRSA by PCR NEGATIVE NEGATIVE Final    Comment:        The GeneXpert MRSA Assay (FDA approved for NASAL specimens only), is one component of a comprehensive MRSA colonization surveillance program. It is not intended to diagnose MRSA infection nor to guide or monitor treatment for MRSA infections. Performed at [facility], [place] [address], [place], [place] [address]      Labs: CBC: Recent Labs  Lab 10/10/20 0418 10/11/20 0301 10/12/20 0248 10/13/20 0250  WBC 3.1* 4.5 4.6 5.4  NEUTROABS 1.8 3.0 2.8 3.4  HGB 9.9* 10.7* 10.8* 11.8*  HCT 30.4* 32.4* 33.6* 37.8*  MCV 90.2 90.5 90.3 95.2  PLT 134* 137* 150 237   Basic Metabolic  Panel: Recent Labs  Lab 10/10/20 0418 10/11/20 0301 10/11/20 0800 10/12/20 0248 10/13/20 0250  NA 136 143  --  140 136  K 3.7 3.5  --  3.6 4.1  CL 105 109  --  110 105  CO2 24 20*  --  22 24  GLUCOSE 109* 92  --  100* 98  BUN 9 9  --  11 7  CREATININE 1.23 0.99  --  1.04 1.04  CALCIUM 8.5* 8.9  --  8.9 8.8*  MG 2.1  --  1.7 1.7  --   PHOS  --   --  3.6 4.3  --    Liver Function Tests: Recent Labs  Lab 10/10/20 0418 10/11/20 0301 10/12/20 0248 10/13/20 0250  AST 57* 87* 51* 35  ALT 44 61* 56* 44  ALKPHOS 51 45 55 48  BILITOT 0.3 1.2 0.9 0.6  PROT 6.4* 6.5 6.7 6.8  ALBUMIN 3.6 3.6 3.5 3.6   CBG: No results for input(s): GLUCAP in the last 168 hours.  Time spent: 35 minutes  Signed:  Berle Mull  [facility] 10/13/2020 5:19 PM

## 2020-10-20 ENCOUNTER — Encounter: Payer: Self-pay | Admitting: Nurse Practitioner

## 2020-10-20 ENCOUNTER — Other Ambulatory Visit: Payer: Self-pay

## 2020-10-20 ENCOUNTER — Ambulatory Visit (INDEPENDENT_AMBULATORY_CARE_PROVIDER_SITE_OTHER): Payer: BC Managed Care – PPO | Admitting: Nurse Practitioner

## 2020-10-20 VITALS — BP 124/80 | HR 92 | Temp 96.8°F | Ht 70.0 in | Wt 196.0 lb

## 2020-10-20 DIAGNOSIS — N179 Acute kidney failure, unspecified: Secondary | ICD-10-CM | POA: Diagnosis not present

## 2020-10-20 DIAGNOSIS — J189 Pneumonia, unspecified organism: Secondary | ICD-10-CM

## 2020-10-20 DIAGNOSIS — I5022 Chronic systolic (congestive) heart failure: Secondary | ICD-10-CM

## 2020-10-20 DIAGNOSIS — K2950 Unspecified chronic gastritis without bleeding: Secondary | ICD-10-CM

## 2020-10-20 LAB — RENAL FUNCTION PANEL
Albumin: 4.5 g/dL (ref 3.5–5.2)
BUN: 15 mg/dL (ref 6–23)
CO2: 27 mEq/L (ref 19–32)
Calcium: 9 mg/dL (ref 8.4–10.5)
Chloride: 102 mEq/L (ref 96–112)
Creatinine, Ser: 1.12 mg/dL (ref 0.40–1.50)
GFR: 82.99 mL/min (ref >60.00)
Glucose, Bld: 78 mg/dL (ref 70–99)
Phosphorus: 4.3 mg/dL (ref 2.3–4.6)
Potassium: 4.1 mEq/L (ref 3.5–5.1)
Sodium: 142 mEq/L (ref 135–145)

## 2020-10-20 LAB — CBC WITH DIFFERENTIAL/PLATELET
Basophils Absolute: 0 10*3/uL (ref 0.0–0.1)
Basophils Relative: 0.5 % (ref 0.0–3.0)
Eosinophils Absolute: 0.2 10*3/uL (ref 0.0–0.7)
Eosinophils Relative: 4.4 % (ref 0.0–5.0)
HCT: 31.2 % — ABNORMAL LOW (ref 39.0–52.0)
Hemoglobin: 10.5 g/dL — ABNORMAL LOW (ref 13.0–17.0)
Lymphocytes Relative: 28.6 % (ref 12.0–46.0)
Lymphs Abs: 1.2 10*3/uL (ref 0.7–4.0)
MCHC: 33.7 g/dL (ref 30.0–36.0)
MCV: 88.1 fl (ref 78.0–100.0)
Monocytes Absolute: 0.5 10*3/uL (ref 0.1–1.0)
Monocytes Relative: 12.6 % — ABNORMAL HIGH (ref 3.0–12.0)
Neutro Abs: 2.3 10*3/uL (ref 1.4–7.7)
Neutrophils Relative %: 53.9 % (ref 43.0–77.0)
Platelets: 405 10*3/uL — ABNORMAL HIGH (ref 150.0–400.0)
RBC: 3.54 Mil/uL — ABNORMAL LOW (ref 4.22–5.81)
RDW: 15.3 % (ref 11.5–15.5)
WBC: 4.2 10*3/uL (ref 4.0–10.5)

## 2020-10-20 MED ORDER — PANTOPRAZOLE SODIUM 40 MG PO TBEC: 40.0000 mg | DELAYED_RELEASE_TABLET | Freq: Every day | ORAL | 0 refills | Status: DC

## 2020-10-20 MED ORDER — SPIRONOLACTONE 25 MG PO TABS: 25.0000 mg | ORAL_TABLET | Freq: Every day | ORAL | 3 refills | Status: DC

## 2020-10-20 NOTE — Patient Instructions (Addendum)
Resume aldactone/spironolactone Hold furosemide/lasix Maintain upcoming appt with cardiology You need to schedule appt with dentist as soon as possible. No need for repeat CXR at this time Go to lab for blood draw

## 2020-10-20 NOTE — Progress Notes (Signed)
Subjective:  Patient ID: Robert [name], male    DOB: 1980-07-30  Age: 40 y.o. MRN: [id number]  CC: Hospitalization Follow-up [facility] follow up patient seen for pneumonia, patient states that he has swelling in both ankles.)  HPI Hospitalization 05/29 to 06/03 due to pneumonia and sepsis. Treated with IV and oral abx. Today he reports LE edema. He is not taking Spironolactone (unsure why he discontinued medication). No cough or worsening SOB or PND. Denies any ETOH consumption since hospital discharge. Reviewed discharge summary We reconciled his medication list today He has appt with CHF clinic on 10/30/2020.  Wt Readings from Last 3 Encounters:  10/20/20 196 lb (88.9 kg)  10/10/20 191 lb 5.8 oz (86.8 kg)  10/02/20 186 lb 12.8 oz (84.7 kg)    BP Readings from Last 3 Encounters:  10/20/20 124/80  10/13/20 (!) 155/95  10/02/20 130/86    Reviewed past Medical, Social and Family history today.  Outpatient Medications Prior to Visit  Medication Sig Dispense Refill   busPIRone (BUSPAR) 5 MG tablet Take 1 tablet (5 mg total) by mouth 3 (three) times daily. 30 tablet 0   carvedilol (COREG) 3.125 MG tablet Take 1 tablet (3.125 mg total) by mouth 2 (two) times daily. 60 tablet 11   digoxin (LANOXIN) 0.125 MG tablet Take 1 tablet (0.125 mg total) by mouth daily. 30 tablet 5   FARXIGA 10 MG TABS tablet TAKE 1 TABLET BY MOUTH ONCE DAILY BEFORE BREAKFAST (Patient taking differently: Take 10 mg by mouth daily.) 60 tablet 1   folic acid (FOLVITE) 1 MG tablet Take 1 tablet (1 mg total) by mouth daily. 30 tablet 3   LORazepam (ATIVAN) 1 MG tablet Take 1 tablet (1 mg total) by mouth every 6 (six) hours as needed for anxiety. 10 tablet 0   magnesium oxide (MAG-OX) 400 MG tablet Take 400 mg by mouth daily.     nicotine (NICODERM CQ - DOSED IN MG/24 HOURS) 21 mg/24hr patch Place 1 patch (21 mg total) onto the skin daily. 28 patch 0   ondansetron (ZOFRAN ODT) 4 MG disintegrating tablet Take 1  tablet (4 mg total) by mouth every 8 (eight) hours as needed for nausea or vomiting. 20 tablet 0   potassium chloride (KLOR-CON) 20 MEQ packet Take 20 mEq by mouth daily.     sacubitril-valsartan (ENTRESTO) 97-103 MG Take 1 tablet by mouth 2 (two) times daily. 60 tablet 11   sucralfate (CARAFATE) 1 g tablet Take 1 tablet (1 g total) by mouth 4 (four) times daily -  with meals and at bedtime. 120 tablet 0   thiamine 100 MG tablet Take 1 tablet (100 mg total) by mouth daily. 30 tablet 0   gabapentin (NEURONTIN) 600 MG tablet On Day 1, take 600 mg every 6 hours (2400 mg total daily dose),  On Day 2, take 600 mg every 8 hours (1800 mg total daily dose), On Day 3, take 600 mg every 12 hours (1200 mg total daily dose), From Day 4 , take 600 mg once at night (600 mg total daily dose) and stop. 10 tablet 0   ivabradine (CORLANOR) 7.5 MG TABS tablet Take 1 tablet (7.5 mg total) by mouth 2 (two) times daily with a meal. 60 tablet 3   pantoprazole (PROTONIX) 40 MG tablet Take 1 tablet (40 mg total) by mouth daily. 30 tablet 0   spironolactone (ALDACTONE) 25 MG tablet Take 1 tablet (25 mg total) by mouth daily. HOLD until 03/26/20 (Patient not  taking: Reported on 10/20/2020) 90 tablet 3   No facility-administered medications prior to visit.   ROS See HPI  Objective:  BP 124/80 (BP Location: Left Arm, Patient Position: Sitting, Cuff Size: Large)   Pulse 92   Temp (!) 96.8 F (36 C) (Temporal)   Ht 5\' 10"  (1.778 m)   Wt 196 lb (88.9 kg)   SpO2 98%   BMI 28.12 kg/m   Physical Exam Vitals reviewed.  Cardiovascular:     Rate and Rhythm: Normal rate and regular rhythm.     Pulses: Normal pulses.     Heart sounds: Normal heart sounds.  Pulmonary:     Effort: Pulmonary effort is normal.     Breath sounds: Normal breath sounds.  Abdominal:     General: Bowel sounds are normal. There is no distension.     Palpations: Abdomen is soft.     Tenderness: There is no abdominal tenderness.  Musculoskeletal:      Right lower leg: Edema present.     Left lower leg: Edema present.  Skin:    Findings: No erythema.  Neurological:     Mental Status: He is alert and oriented to person, place, and time.  Psychiatric:        Mood and Affect: Mood normal.        Behavior: Behavior normal.        Thought Content: Thought content normal.   Assessment & Plan:  This visit occurred during the SARS-CoV-2 public health emergency.  Safety protocols were in place, including screening questions prior to the visit, additional usage of staff PPE, and extensive cleaning of exam room while observing appropriate contact time as indicated for disinfecting solutions.   [name] was seen today for hospitalization follow-up.  Diagnoses and all orders for this visit:  Pneumonia of left lower lobe due to infectious organism -     CBC w/Diff  AKI (acute kidney injury) ([place]) -     Renal Function Panel  Chronic gastritis without bleeding, unspecified gastritis type -     pantoprazole (PROTONIX) 40 MG tablet; Take 1 tablet (40 mg total) by mouth daily.  Chronic systolic CHF (congestive heart failure) (HCC) -     spironolactone (ALDACTONE) 25 MG tablet; Take 1 tablet (25 mg total) by mouth daily.   Problem List Items Addressed This Visit       Cardiovascular and Mediastinum   Chronic systolic CHF (congestive heart failure) (HCC)   Relevant Medications   spironolactone (ALDACTONE) 25 MG tablet     Respiratory   Pneumonia - Primary   Relevant Orders   CBC w/Diff (Completed)     Digestive   Gastritis   Relevant Medications   pantoprazole (PROTONIX) 40 MG tablet     Genitourinary   AKI (acute kidney injury) ([place])   Relevant Orders   Renal Function Panel (Completed)    Follow-up: Return if symptoms worsen or fail to improve.  Robert Lacy, NP

## 2020-10-21 ENCOUNTER — Encounter: Payer: Self-pay | Admitting: Nurse Practitioner

## 2020-10-28 NOTE — Progress Notes (Signed)
ADVANCED HF CLINIC NOTE  PCP: [name] NP Primary HF Cardiologist: Dr Haroldine Laws    Reason for F/u: Heart Failure    HPI: Mr.Tsao is a 40 y/o male with h/o heavy ETOH and cocaine use, PE,  biventricular systolic heart failure and tobacco abuse.   Admitted with acute onset biventricular HF in 5/21. EF 15%. RV down. He was drinking at least 2 beers every night and about 12-24 over the weekend. Also drinking 1/5 of liqour every other day. Cath showed normal coronaries. Elevated filling pressures with normal output. Placed on milrinone and diuresed with IV lasix. As he improved milrinone was weaned off . Transitioned to oral lasix. 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 CT showed small LLL PE. Placed on xarelto. Started on HF meds.  Discharge weight 178 pounds.   Readmitted 6/21 for mild acute blood loss related anemia. EGD showed no active bleeding but evidence of some gastritis.  He was switched to oral PPI twice daily, H&H remained stable and he did not require any transfusions.  He was instructed to continue Xarelto.   Continues to have very frequent ED visits. Admitted 10/25-10/28/21 for ETOH withdrawal symptoms and AKI. Admitted 03/23/2020 with AMS in the setting ETOH abuse. Hypotensive, AKI, and with lactic acidosis. Entresto, spiro, and lasix. All restarted prior to discharge. Returned to the ED on 03/26/20 with nausea/vomiting.   Echo 10/21 EF 30-35% mild RV HK.   Admitted 07/10/20-07/12/20 with seizure in the setting of ETOH withdrawal.   Admitted 07/29/20-07/31/20 with n/v abdominal pain. Had AKI. GI consulted and recommended EGD. He felt better and refused EGD. Plan to continue PPI + carafate. Xarelto and zoloft stopped. Discharged earlier today from the hospital.   Evaluated in the ED 08/19/20 with AKI, 09/01/20 with ETOH withdrawl, and 09/11/20 with atypical chest pain.   Admitted 10/08/20 for CP, SOB, N/V. Blood ethanol level 180. CXR concerning for PNA, possible infected  pneumatocele. Treated with IV abx. Hospitalization c/b AKI and acute ETOH withdrawal, requiring ICU care. Seroquel and sertraline stopped at discharge.  Today he returns for HF follow up. Overall feeling fine. Says he gets SOB with walking, sometimes. Denies increasing SOB, CP, dizziness, edema, or PND/Orthopnea. Appetite ok. No fever or chills. Not weighing at home, has scale. Lives with his mom. He requires assistance with transportation. His Mom has been driving him to appointments. No ETOH in 5 days. Forgetting meds 2-3 days/week. Smoking 3-4 cigs/day. Asking when he can go back to work.  Cardiac studies. Echo (5/21): EF <20%  Prominent trabeculation. Grade III DD  R/LHC (5/21): showed normal coronaries, elevated filling pressures with normal output.  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 No tamponade physiology.  Echo (10/21): EF 30-35% RV normal. No effusion.  Echo (2/22): EF 30%   ROS: All systems negative except as listed in HPI, PMH and Problem List.  SH:  Social History   Socioeconomic History   Marital status: Single    Spouse name: Not on file   Number of children: Not on file   Years of education: Not on file   Highest education level: Not on file  Occupational History   Occupation: [employer]  Tobacco Use   Smoking status: Every Day    Packs/day: 0.25    Years: 24.00    Pack years: 6.00    Types: Cigarettes   Smokeless tobacco: Never  Vaping Use  Vaping Use: Former  Substance and Sexual Activity   Alcohol use: Yes    Alcohol/week: 8.0 standard drinks    Types: 8 Shots of liquor per week    Comment: heavy use since age 21   Drug use: Yes    Frequency: 1.0 times per week    Types: Marijuana   Sexual activity: Yes  Other Topics Concern   Not on file  Social History Narrative   Not on file   Social Determinants of Health   Financial Resource  Strain: High Risk   Difficulty of Paying Living Expenses: Very hard  Food Insecurity: Food Insecurity Present   Worried About Charity fundraiser in the Last Year: Sometimes true   Arboriculturist in the Last Year: Sometimes true  Transportation Needs: Not on file  Physical Activity: Not on file  Stress: Not on file  Social Connections: Not on file  Intimate Partner Violence: Not on file   FH:  Family History  Problem Relation Age of Onset   Heart failure Father    Diabetes Father    Kidney disease Father    Heart disease Father 110   Heart disease Maternal Grandfather 20   Heart disease Paternal Grandfather 27   Past Medical History:  Diagnosis Date   Alcohol use    Alcohol withdrawal ([place]) 03/06/2020   Asthma    CHF (congestive heart failure) (HCC)    Current Outpatient Medications  Medication Sig Dispense Refill   busPIRone (BUSPAR) 5 MG tablet Take 1 tablet (5 mg total) by mouth 3 (three) times daily. 30 tablet 0   carvedilol (COREG) 3.125 MG tablet Take 1 tablet (3.125 mg total) by mouth 2 (two) times daily. 60 tablet 11   digoxin (LANOXIN) 0.125 MG tablet Take 1 tablet (0.125 mg total) by mouth daily. 30 tablet 5   folic acid (FOLVITE) 1 MG tablet Take 1 tablet (1 mg total) by mouth daily. 30 tablet 3   magnesium oxide (MAG-OX) 400 MG tablet Take 400 mg by mouth daily.     nicotine (NICODERM CQ - DOSED IN MG/24 HOURS) 21 mg/24hr patch Place 1 patch (21 mg total) onto the skin daily. 28 patch 0   ondansetron (ZOFRAN ODT) 4 MG disintegrating tablet Take 1 tablet (4 mg total) by mouth every 8 (eight) hours as needed for nausea or vomiting. 20 tablet 0   pantoprazole (PROTONIX) 40 MG tablet Take 1 tablet (40 mg total) by mouth daily. 90 tablet 0   sacubitril-valsartan (ENTRESTO) 97-103 MG Take 1 tablet by mouth 2 (two) times daily. 60 tablet 11   spironolactone (ALDACTONE) 25 MG tablet Take 1 tablet (25 mg total) by mouth daily. 90 tablet 3   sucralfate (CARAFATE) 1 g tablet  Take 1 tablet (1 g total) by mouth 4 (four) times daily -  with meals and at bedtime. 120 tablet 0   thiamine 100 MG tablet Take 1 tablet (100 mg total) by mouth daily. 30 tablet 0   dapagliflozin propanediol (FARXIGA) 10 MG TABS tablet Take 1 tablet (10 mg total) by mouth daily before breakfast. 30 tablet 11   ivabradine (CORLANOR) 7.5 MG TABS tablet Take 1 tablet (7.5 mg total) by mouth 2 (two) times daily with a meal. 60 tablet 11   No current facility-administered medications for this encounter.   Wt Readings from Last 3 Encounters:  10/30/20 89.9 kg (198 lb 3.2 oz)  10/20/20 88.9 kg (196 lb)  10/10/20 86.8 kg (191 lb  5.8 oz)   Vitals:   10/30/20 1344  BP: 138/80  Pulse: 82  SpO2: 98%  Weight: 89.9 kg (198 lb 3.2 oz)   PHYSICAL EXAM: General:  NAD. No resp difficulty. HEENT: Normal Neck: Supple. JVP 7-8. Carotids 2+ bilat; no bruits. No lymphadenopathy or thryomegaly appreciated. Cor: PMI nondisplaced. Regular rate & rhythm. No rubs, gallops or murmurs. Lungs: Clear, RUL exp wheezing. Abdomen: Soft, nontender, nondistended. No hepatosplenomegaly. No bruits or masses. Good bowel sounds. Extremities: No cyanosis, clubbing, rash, edema Neuro: alert & oriented x 3, cranial nerves grossly intact. Moves all 4 extremities w/o difficulty. Flat affect.  ECG: SR, 86 bpm qtc 490 ms (personally reviewed).  Reds: 39%  ASSESSMENT & PLAN: 1. Chronic Systolic Heart Failure, NICM  - Echo (5/21): EF 15% RV moderately reduced. Suspect ETOH and cocaine playing a role.  - LHC (5/21): normal cors. 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  - Echo (10/21): EF 30-35% RV normal. No effusion.  - Echo (2/22): EF 30%  - NYHA III. Volume status stable. - Continue Entresto  97-103 mg bid.  - Continue Coreg 3.125 mg bid.  - Continue spiro 25 mg daily. - Continue digoxin 0.125 mg daily, dig level today. - Continue Corlanor 7.5 mg  bid.  - Continue Farxiga 10 mg daily. ? Continue if he drinks ETOH.  - Refer for genetic testing for LVNC.  - Not a candidate for ICD or advanced therapies w/ polysubstance. - Compliance with medications remain an issue. Stressed he needs to take his medications every day. - BMET and dig level today.  2. H/o Syncope - 07/10/20. - Denies related to ETOH intoxication. - No driving x 6 months per [place] law. He is aware.    3. Pulmonary Emboli - CTA w/ small LLL PE 5/21. - LE Venous dopplers negative for DVT.  - Given small PE. Risk of ongoing AC likely outweighs benefit. - Off Xarelto.   4. ETOH Abuse - Heavy drinker for many years since the age of 38.  - 01/2020 had 28 day detox [facility] but started drinking again. Considering another attempt inpatient. - Asked if he is interested in [place]. He has the information and will consider.    5. H/O Cocaine Abuse - No recent use.  - UDS 10/08/20 negative.  6. Tobacco Abuse - Smoking on occasion.   7. H/o GIB/ Gastritis w/ ABLA - h/o heavy ETOH abuse + chronic anticoagulation therapy w/ Xarelto - Off xarelto.  - Refused EGD 07/28/20.  Difficult to manage with ongoing addiction issues. Hard to know what he is taking and how often. He has been discharged from the Paramedicine program due to on-going non compliance.  Follow up with APP 4 weeks, then with Dr. Haroldine Laws 3-4 months.  [place], FNP-BC  10/30/20 4:18 PM

## 2020-10-30 ENCOUNTER — Ambulatory Visit (HOSPITAL_COMMUNITY)
Admission: RE | Admit: 2020-10-30 | Discharge: 2020-10-30 | Disposition: A | Discharge status: Home / Self Care | Payer: BC Managed Care – PPO | Source: Ambulatory Visit | Attending: Family Medicine | Admitting: Family Medicine

## 2020-10-30 ENCOUNTER — Other Ambulatory Visit: Payer: Self-pay

## 2020-10-30 ENCOUNTER — Encounter (HOSPITAL_COMMUNITY): Payer: Self-pay

## 2020-10-30 VITALS — BP 138/80 | HR 82 | Wt 198.2 lb

## 2020-10-30 DIAGNOSIS — Z8249 Family history of ischemic heart disease and other diseases of the circulatory system: Secondary | ICD-10-CM | POA: Diagnosis not present

## 2020-10-30 DIAGNOSIS — I5082 Biventricular heart failure: Secondary | ICD-10-CM | POA: Diagnosis not present

## 2020-10-30 DIAGNOSIS — Z86711 Personal history of pulmonary embolism: Secondary | ICD-10-CM | POA: Insufficient documentation

## 2020-10-30 DIAGNOSIS — F1411 Cocaine abuse, in remission: Secondary | ICD-10-CM | POA: Diagnosis not present

## 2020-10-30 DIAGNOSIS — F1410 Cocaine abuse, uncomplicated: Secondary | ICD-10-CM

## 2020-10-30 DIAGNOSIS — Z5941 Food insecurity: Secondary | ICD-10-CM | POA: Diagnosis not present

## 2020-10-30 DIAGNOSIS — Z79899 Other long term (current) drug therapy: Secondary | ICD-10-CM | POA: Diagnosis not present

## 2020-10-30 DIAGNOSIS — I2699 Other pulmonary embolism without acute cor pulmonale: Secondary | ICD-10-CM | POA: Diagnosis not present

## 2020-10-30 DIAGNOSIS — Z720 Tobacco use: Secondary | ICD-10-CM

## 2020-10-30 DIAGNOSIS — I428 Other cardiomyopathies: Secondary | ICD-10-CM | POA: Diagnosis not present

## 2020-10-30 DIAGNOSIS — F17210 Nicotine dependence, cigarettes, uncomplicated: Secondary | ICD-10-CM | POA: Insufficient documentation

## 2020-10-30 DIAGNOSIS — I5022 Chronic systolic (congestive) heart failure: Secondary | ICD-10-CM | POA: Diagnosis not present

## 2020-10-30 DIAGNOSIS — Z596 Low income: Secondary | ICD-10-CM | POA: Diagnosis not present

## 2020-10-30 DIAGNOSIS — R55 Syncope and collapse: Secondary | ICD-10-CM

## 2020-10-30 DIAGNOSIS — Z8719 Personal history of other diseases of the digestive system: Secondary | ICD-10-CM | POA: Diagnosis not present

## 2020-10-30 DIAGNOSIS — F1010 Alcohol abuse, uncomplicated: Secondary | ICD-10-CM | POA: Insufficient documentation

## 2020-10-30 LAB — DIGOXIN LEVEL: Digoxin Level: 0.3 ng/mL — ABNORMAL LOW (ref 0.8–2.0)

## 2020-10-30 LAB — BASIC METABOLIC PANEL
Anion gap: 8 (ref 5–15)
BUN: 9 mg/dL (ref 6–20)
CO2: 27 mmol/L (ref 22–32)
Calcium: 9.3 mg/dL (ref 8.9–10.3)
Chloride: 101 mmol/L (ref 98–111)
Creatinine, Ser: 0.89 mg/dL (ref 0.61–1.24)
GFR, Estimated: >60 mL/min (ref >60)
Glucose, Bld: 97 mg/dL (ref 70–99)
Potassium: 3.7 mmol/L (ref 3.5–5.1)
Sodium: 136 mmol/L (ref 135–145)

## 2020-10-30 MED ORDER — IVABRADINE HCL 7.5 MG PO TABS: 7.5000 mg | ORAL_TABLET | Freq: Two times a day (BID) | ORAL | 11 refills | Status: DC

## 2020-10-30 MED ORDER — DAPAGLIFLOZIN PROPANEDIOL 10 MG PO TABS: 10.0000 mg | ORAL_TABLET | Freq: Every day | ORAL | 11 refills | Status: DC

## 2020-10-30 NOTE — Patient Instructions (Addendum)
EKG done today.  Labs done today. We will contact you only if your labs are abnormal.  No medication changes were made. Please continue all current medications as prescribed.  Your physician recommends that you schedule a follow-up appointment in: 2-4 weeks with our APP Clinic here in our office and in 2-3 months with Dr. Haroldine Laws.   If you have any questions or concerns before your next appointment please send [place] a message through [place] or call our office at [phone].    TO LEAVE A MESSAGE FOR THE NURSE SELECT OPTION 2, PLEASE LEAVE A MESSAGE INCLUDING: YOUR NAME DATE OF BIRTH CALL BACK NUMBER REASON FOR CALL**this is important as we prioritize the call backs  YOU WILL RECEIVE A CALL BACK THE SAME DAY AS LONG AS YOU CALL BEFORE 4:00 PM   Do the following things EVERYDAY: Weigh yourself in the morning before breakfast. Write it down and keep it in a log. Take your medicines as prescribed Eat low salt foods--Limit salt (sodium) to 2000 mg per day.  Stay as active as you can everyday Limit all fluids for the day to less than 2 liters   At the [facility], you and your health needs are our priority. As part of our continuing mission to provide you with exceptional heart care, we have created designated Provider Care Teams. These Care Teams include your primary Cardiologist (physician) and Advanced Practice Providers (APPs- Physician Assistants and Nurse Practitioners) who all work together to provide you with the care you need, when you need it.   You may see any of the following providers on your designated Care Team at your next follow up: Dr Glori Bickers Dr Haynes Kerns, NP Lyda Jester, [place] Audry Riles, PharmD   Please be sure to bring in all your medications bottles to every appointment.  2-4

## 2020-10-30 NOTE — Progress Notes (Signed)
ReDS Vest / Clip - 10/30/20 1400       ReDS Vest / Clip   Station Marker D    Ruler Value 36    ReDS Value Range Moderate volume overload    ReDS Actual Value 39    Anatomical Comments SITTING

## 2020-11-08 ENCOUNTER — Telehealth (HOSPITAL_COMMUNITY): Payer: Self-pay | Admitting: Pharmacy Technician

## 2020-11-08 ENCOUNTER — Other Ambulatory Visit (HOSPITAL_COMMUNITY): Payer: Self-pay

## 2020-11-08 NOTE — Telephone Encounter (Signed)
Advanced Heart Failure Patient Advocate Encounter  I received a renewal application for Xarelto for this patient. The patient was taken off of Xarelto. No need to apply for renewal at this time.  Will be here to assist in the future as needed.  Charlann Boxer, CPhT

## 2020-11-09 ENCOUNTER — Other Ambulatory Visit (HOSPITAL_COMMUNITY): Payer: Self-pay

## 2020-11-09 ENCOUNTER — Telehealth (HOSPITAL_COMMUNITY): Payer: Self-pay | Admitting: Pharmacy Technician

## 2020-11-09 NOTE — Telephone Encounter (Signed)
Advanced Heart Failure Patient Advocate Encounter  I received a renewal request for Entresto assistance through [employer]. Patient has Pharmacist, community. Cannot see co-pay as insurance will only pay at [facility] but I can see that the insurance is paying on the RX. The patient has previous co-pay card, co-pay should be $10.   Looks like RX is still active at [employer].  Charlann Boxer, CPhT

## 2020-11-17 ENCOUNTER — Emergency Department (HOSPITAL_COMMUNITY)
Admission: EM | Admit: 2020-11-17 | Discharge: 2020-11-17 | Disposition: A | Discharge status: Home / Self Care | Payer: BC Managed Care – PPO | Source: Home / Self Care

## 2020-11-17 ENCOUNTER — Encounter (HOSPITAL_COMMUNITY): Payer: Self-pay | Admitting: Emergency Medicine

## 2020-11-17 ENCOUNTER — Other Ambulatory Visit: Payer: Self-pay

## 2020-11-17 ENCOUNTER — Emergency Department (HOSPITAL_COMMUNITY): Payer: BC Managed Care – PPO

## 2020-11-17 DIAGNOSIS — R0602 Shortness of breath: Secondary | ICD-10-CM | POA: Insufficient documentation

## 2020-11-17 DIAGNOSIS — Z5321 Procedure and treatment not carried out due to patient leaving prior to being seen by health care provider: Secondary | ICD-10-CM | POA: Insufficient documentation

## 2020-11-17 DIAGNOSIS — R079 Chest pain, unspecified: Secondary | ICD-10-CM | POA: Insufficient documentation

## 2020-11-17 DIAGNOSIS — N179 Acute kidney failure, unspecified: Secondary | ICD-10-CM | POA: Diagnosis not present

## 2020-11-17 DIAGNOSIS — R110 Nausea: Secondary | ICD-10-CM | POA: Insufficient documentation

## 2020-11-17 LAB — COMPREHENSIVE METABOLIC PANEL
ALT: 51 U/L — ABNORMAL HIGH (ref 0–44)
AST: 43 U/L — ABNORMAL HIGH (ref 15–41)
Albumin: 4.8 g/dL (ref 3.5–5.0)
Alkaline Phosphatase: 50 U/L (ref 38–126)
Anion gap: 15 (ref 5–15)
BUN: 20 mg/dL (ref 6–20)
CO2: 26 mmol/L (ref 22–32)
Calcium: 9.8 mg/dL (ref 8.9–10.3)
Chloride: 96 mmol/L — ABNORMAL LOW (ref 98–111)
Creatinine, Ser: 1.37 mg/dL — ABNORMAL HIGH (ref 0.61–1.24)
GFR, Estimated: >60 mL/min (ref >60)
Glucose, Bld: 153 mg/dL — ABNORMAL HIGH (ref 70–99)
Potassium: 3.9 mmol/L (ref 3.5–5.1)
Sodium: 137 mmol/L (ref 135–145)
Total Bilirubin: 1.4 mg/dL — ABNORMAL HIGH (ref 0.3–1.2)
Total Protein: 8.3 g/dL — ABNORMAL HIGH (ref 6.5–8.1)

## 2020-11-17 LAB — CBC WITH DIFFERENTIAL/PLATELET
Abs Immature Granulocytes: 0.01 10*3/uL (ref 0.00–0.07)
Basophils Absolute: 0 10*3/uL (ref 0.0–0.1)
Basophils Relative: 1 %
Eosinophils Absolute: 0 10*3/uL (ref 0.0–0.5)
Eosinophils Relative: 0 %
HCT: 38.4 % — ABNORMAL LOW (ref 39.0–52.0)
Hemoglobin: 12.4 g/dL — ABNORMAL LOW (ref 13.0–17.0)
Immature Granulocytes: 0 %
Lymphocytes Relative: 14 %
Lymphs Abs: 0.7 10*3/uL (ref 0.7–4.0)
MCH: 28.4 pg (ref 26.0–34.0)
MCHC: 32.3 g/dL (ref 30.0–36.0)
MCV: 88.1 fL (ref 80.0–100.0)
Monocytes Absolute: 0.5 10*3/uL (ref 0.1–1.0)
Monocytes Relative: 9 %
Neutro Abs: 3.9 10*3/uL (ref 1.7–7.7)
Neutrophils Relative %: 76 %
Platelets: 366 10*3/uL (ref 150–400)
RBC: 4.36 MIL/uL (ref 4.22–5.81)
RDW: 14.3 % (ref 11.5–15.5)
WBC: 5.2 10*3/uL (ref 4.0–10.5)
nRBC: 0 % (ref 0.0–0.2)

## 2020-11-17 LAB — TROPONIN I (HIGH SENSITIVITY): Troponin I (High Sensitivity): 7 ng/L (ref <18)

## 2020-11-17 LAB — LIPASE, BLOOD: Lipase: 28 U/L (ref 11–51)

## 2020-11-17 LAB — BRAIN NATRIURETIC PEPTIDE: B Natriuretic Peptide: 258.7 pg/mL — ABNORMAL HIGH (ref 0.0–100.0)

## 2020-11-17 NOTE — ED Notes (Signed)
Pt decided left emergency department 

## 2020-11-17 NOTE — ED Provider Notes (Signed)
Emergency Medicine Provider Triage Evaluation Note  Robert [name] , a 40 y.o. male  was evaluated in triage.  Pt complains of 40 year old male with history of biventricular heart failure approximately 1 year ago.  EF was 15% with history of alcoholism and polysubstance abuse.  Cath was normal appears to be on milrinone he is on Xarelto after CT scan showed a small left lower lobe PE.  He was continued on Xarelto.  History of frequent ER visits has had issues with alcohol withdrawal.  Has had atypical chest pain during ER visit.  He was last seen by cardiologist 10/2020.  Most recent echo 2/28 showed EF of 30%.  Patient here for chest pain since this morning he states that he vomited once this morning chest pain since then.  He states it is left-sided aching and constant.  Denies any other associate symptoms currently apart from nausea.  States his last alcoholic drink was 2 or 3 days ago.  Review of Systems  Positive: Chest pain, shortness of breath Negative: Leg swelling  Physical Exam  BP 126/89 (BP Location: Right Arm)   Pulse 62   Temp 98.3 F (36.8 C) (Oral)   Resp 16   SpO2 100%  Gen:   Awake, no distress   Resp:  Normal effort  MSK:   Moves extremities without difficulty  Other:  Murmur  Medical Decision Making  Medically screening exam initiated at 2:19 PM.  Appropriate orders placed.  Robert [name] was informed that the remainder of the evaluation will be completed by another provider, this initial triage assessment does not replace that evaluation, and the importance of remaining in the ED until their evaluation is complete.  Patient is a 40 year old male Significant past medical history.  Nausea plus chest pain plus shortness of breath.  History of heart failure likely alcohol and recreational drug use related.  May be experiencing some withdrawal symptoms.   Tedd Sias, [place] 11/17/20 1434    Valarie Merino, MD 11/18/20 [phone]

## 2020-11-17 NOTE — ED Triage Notes (Signed)
Patient complains of sudden onset of chest pain that started while watching TV This morning. Also reports nausea. Patient alert, oriented, and in on apparent distress at this time.

## 2020-11-19 ENCOUNTER — Encounter (HOSPITAL_COMMUNITY): Payer: Self-pay | Admitting: Emergency Medicine

## 2020-11-19 ENCOUNTER — Observation Stay (HOSPITAL_COMMUNITY): Payer: BC Managed Care – PPO

## 2020-11-19 ENCOUNTER — Other Ambulatory Visit: Payer: Self-pay

## 2020-11-19 ENCOUNTER — Inpatient Hospital Stay (HOSPITAL_COMMUNITY)
Admission: EM | Admit: 2020-11-19 | Discharge: 2020-11-21 | DRG: 683 | Disposition: A | Discharge status: Home / Self Care | Payer: BC Managed Care – PPO | Attending: Internal Medicine | Admitting: Internal Medicine

## 2020-11-19 ENCOUNTER — Emergency Department (HOSPITAL_COMMUNITY): Payer: BC Managed Care – PPO

## 2020-11-19 DIAGNOSIS — K219 Gastro-esophageal reflux disease without esophagitis: Secondary | ICD-10-CM | POA: Diagnosis present

## 2020-11-19 DIAGNOSIS — N179 Acute kidney failure, unspecified: Principal | ICD-10-CM

## 2020-11-19 DIAGNOSIS — F32A Depression, unspecified: Secondary | ICD-10-CM | POA: Diagnosis present

## 2020-11-19 DIAGNOSIS — J45909 Unspecified asthma, uncomplicated: Secondary | ICD-10-CM | POA: Diagnosis present

## 2020-11-19 DIAGNOSIS — Z20822 Contact with and (suspected) exposure to covid-19: Secondary | ICD-10-CM | POA: Diagnosis present

## 2020-11-19 DIAGNOSIS — Z833 Family history of diabetes mellitus: Secondary | ICD-10-CM

## 2020-11-19 DIAGNOSIS — F17210 Nicotine dependence, cigarettes, uncomplicated: Secondary | ICD-10-CM | POA: Diagnosis present

## 2020-11-19 DIAGNOSIS — R197 Diarrhea, unspecified: Secondary | ICD-10-CM | POA: Diagnosis present

## 2020-11-19 DIAGNOSIS — Z79899 Other long term (current) drug therapy: Secondary | ICD-10-CM

## 2020-11-19 DIAGNOSIS — E869 Volume depletion, unspecified: Secondary | ICD-10-CM | POA: Diagnosis present

## 2020-11-19 DIAGNOSIS — I5022 Chronic systolic (congestive) heart failure: Secondary | ICD-10-CM | POA: Diagnosis present

## 2020-11-19 DIAGNOSIS — Z86711 Personal history of pulmonary embolism: Secondary | ICD-10-CM

## 2020-11-19 DIAGNOSIS — E876 Hypokalemia: Secondary | ICD-10-CM

## 2020-11-19 DIAGNOSIS — Z8249 Family history of ischemic heart disease and other diseases of the circulatory system: Secondary | ICD-10-CM

## 2020-11-19 LAB — CBC WITH DIFFERENTIAL/PLATELET
Abs Immature Granulocytes: 0.01 10*3/uL (ref 0.00–0.07)
Basophils Absolute: 0 10*3/uL (ref 0.0–0.1)
Basophils Relative: 1 %
Eosinophils Absolute: 0 10*3/uL (ref 0.0–0.5)
Eosinophils Relative: 1 %
HCT: 37.5 % — ABNORMAL LOW (ref 39.0–52.0)
Hemoglobin: 12.4 g/dL — ABNORMAL LOW (ref 13.0–17.0)
Immature Granulocytes: 0 %
Lymphocytes Relative: 13 %
Lymphs Abs: 0.7 10*3/uL (ref 0.7–4.0)
MCH: 28.4 pg (ref 26.0–34.0)
MCHC: 33.1 g/dL (ref 30.0–36.0)
MCV: 86 fL (ref 80.0–100.0)
Monocytes Absolute: 0.4 10*3/uL (ref 0.1–1.0)
Monocytes Relative: 8 %
Neutro Abs: 4.1 10*3/uL (ref 1.7–7.7)
Neutrophils Relative %: 77 %
Platelets: 302 10*3/uL (ref 150–400)
RBC: 4.36 MIL/uL (ref 4.22–5.81)
RDW: 14.7 % (ref 11.5–15.5)
WBC: 5.3 10*3/uL (ref 4.0–10.5)
nRBC: 0 % (ref 0.0–0.2)

## 2020-11-19 LAB — BRAIN NATRIURETIC PEPTIDE: B Natriuretic Peptide: 51.8 pg/mL (ref 0.0–100.0)

## 2020-11-19 LAB — COMPREHENSIVE METABOLIC PANEL
ALT: 52 U/L — ABNORMAL HIGH (ref 0–44)
AST: 38 U/L (ref 15–41)
Albumin: 5.1 g/dL — ABNORMAL HIGH (ref 3.5–5.0)
Alkaline Phosphatase: 52 U/L (ref 38–126)
Anion gap: 15 (ref 5–15)
BUN: 27 mg/dL — ABNORMAL HIGH (ref 6–20)
CO2: 28 mmol/L (ref 22–32)
Calcium: 9.4 mg/dL (ref 8.9–10.3)
Chloride: 93 mmol/L — ABNORMAL LOW (ref 98–111)
Creatinine, Ser: 3.2 mg/dL — ABNORMAL HIGH (ref 0.61–1.24)
GFR, Estimated: 24 mL/min — ABNORMAL LOW (ref >60)
Glucose, Bld: 113 mg/dL — ABNORMAL HIGH (ref 70–99)
Potassium: 3.6 mmol/L (ref 3.5–5.1)
Sodium: 136 mmol/L (ref 135–145)
Total Bilirubin: 0.9 mg/dL (ref 0.3–1.2)
Total Protein: 8.5 g/dL — ABNORMAL HIGH (ref 6.5–8.1)

## 2020-11-19 LAB — CBC
HCT: 39.2 % (ref 39.0–52.0)
Hemoglobin: 12.8 g/dL — ABNORMAL LOW (ref 13.0–17.0)
MCH: 28.9 pg (ref 26.0–34.0)
MCHC: 32.7 g/dL (ref 30.0–36.0)
MCV: 88.5 fL (ref 80.0–100.0)
Platelets: 311 10*3/uL (ref 150–400)
RBC: 4.43 MIL/uL (ref 4.22–5.81)
RDW: 14.8 % (ref 11.5–15.5)
WBC: 7.5 10*3/uL (ref 4.0–10.5)
nRBC: 0 % (ref 0.0–0.2)

## 2020-11-19 LAB — TROPONIN I (HIGH SENSITIVITY)
Troponin I (High Sensitivity): 8 ng/L (ref <18)
Troponin I (High Sensitivity): 8 ng/L (ref <18)

## 2020-11-19 LAB — CREATININE, SERUM
Creatinine, Ser: 2.92 mg/dL — ABNORMAL HIGH (ref 0.61–1.24)
GFR, Estimated: 27 mL/min — ABNORMAL LOW (ref >60)

## 2020-11-19 LAB — DIGOXIN LEVEL: Digoxin Level: 0.4 ng/mL — ABNORMAL LOW (ref 0.8–2.0)

## 2020-11-19 LAB — MAGNESIUM: Magnesium: 1.8 mg/dL (ref 1.7–2.4)

## 2020-11-19 LAB — LIPASE, BLOOD: Lipase: 26 U/L (ref 11–51)

## 2020-11-19 LAB — RESP PANEL BY RT-PCR (FLU A&B, COVID) ARPGX2
Influenza A by PCR: NEGATIVE
Influenza B by PCR: NEGATIVE
SARS Coronavirus 2 by RT PCR: NEGATIVE

## 2020-11-19 MED ORDER — ACETAMINOPHEN 325 MG PO TABS: 650.0000 mg | ORAL_TABLET | Freq: Four times a day (QID) | ORAL | Status: DC | PRN

## 2020-11-19 MED ORDER — THIAMINE HCL 100 MG PO TABS
100.0000 mg | ORAL_TABLET | Freq: Every day | ORAL | Status: DC
  Administered 2020-11-19 – 2020-11-21 (×3): 100 mg via ORAL
  Filled 2020-11-19 (×3): qty 1

## 2020-11-19 MED ORDER — DAPAGLIFLOZIN PROPANEDIOL 10 MG PO TABS: 10.0000 mg | ORAL_TABLET | Freq: Every day | ORAL | Status: DC

## 2020-11-19 MED ORDER — ENOXAPARIN SODIUM 40 MG/0.4ML IJ SOSY
40.0000 mg | PREFILLED_SYRINGE | INTRAMUSCULAR | Status: DC
  Administered 2020-11-19 – 2020-11-20 (×2): 40 mg via SUBCUTANEOUS
  Filled 2020-11-19 (×2): qty 0.4

## 2020-11-19 MED ORDER — HYDROCODONE-ACETAMINOPHEN 5-325 MG PO TABS
1.0000 | ORAL_TABLET | ORAL | Status: DC | PRN
  Administered 2020-11-20: 2 via ORAL
  Filled 2020-11-19: qty 2

## 2020-11-19 MED ORDER — IVABRADINE HCL 5 MG PO TABS
7.5000 mg | ORAL_TABLET | Freq: Two times a day (BID) | ORAL | Status: DC
  Administered 2020-11-19 – 2020-11-21 (×4): 7.5 mg via ORAL
  Filled 2020-11-19 (×4): qty 2

## 2020-11-19 MED ORDER — CARVEDILOL 3.125 MG PO TABS
3.1250 mg | ORAL_TABLET | Freq: Two times a day (BID) | ORAL | Status: DC
  Administered 2020-11-19 – 2020-11-21 (×4): 3.125 mg via ORAL
  Filled 2020-11-19 (×4): qty 1

## 2020-11-19 MED ORDER — SODIUM CHLORIDE 0.9 % IV BOLUS
500.0000 mL | Freq: Once | INTRAVENOUS | Status: AC
  Administered 2020-11-19: 500 mL via INTRAVENOUS

## 2020-11-19 MED ORDER — ONDANSETRON HCL 4 MG PO TABS: 4.0000 mg | ORAL_TABLET | Freq: Four times a day (QID) | ORAL | Status: DC | PRN

## 2020-11-19 MED ORDER — HYDROMORPHONE HCL 1 MG/ML IJ SOLN
0.5000 mg | Freq: Once | INTRAMUSCULAR | Status: AC
Start: 2020-11-19 — End: 2020-11-19
  Administered 2020-11-19: 0.5 mg via INTRAVENOUS
  Filled 2020-11-19: qty 1

## 2020-11-19 MED ORDER — ONDANSETRON HCL 4 MG/2ML IJ SOLN
4.0000 mg | Freq: Once | INTRAMUSCULAR | Status: AC
  Administered 2020-11-19: 4 mg via INTRAVENOUS
  Filled 2020-11-19: qty 2

## 2020-11-19 MED ORDER — FOLIC ACID 1 MG PO TABS
1.0000 mg | ORAL_TABLET | Freq: Every day | ORAL | Status: DC
  Administered 2020-11-19 – 2020-11-21 (×3): 1 mg via ORAL
  Filled 2020-11-19 (×3): qty 1

## 2020-11-19 MED ORDER — SODIUM CHLORIDE 0.9 % IV SOLN: Freq: Once | INTRAVENOUS | Status: AC

## 2020-11-19 MED ORDER — NICOTINE 21 MG/24HR TD PT24
21.0000 mg | MEDICATED_PATCH | Freq: Every day | TRANSDERMAL | Status: DC
  Administered 2020-11-19 – 2020-11-20 (×2): 21 mg via TRANSDERMAL
  Filled 2020-11-19 (×2): qty 1

## 2020-11-19 MED ORDER — ONDANSETRON HCL 4 MG/2ML IJ SOLN: 4.0000 mg | Freq: Four times a day (QID) | INTRAMUSCULAR | Status: DC | PRN

## 2020-11-19 MED ORDER — SUCRALFATE 1 G PO TABS
1.0000 g | ORAL_TABLET | Freq: Three times a day (TID) | ORAL | Status: DC
  Administered 2020-11-19 – 2020-11-21 (×7): 1 g via ORAL
  Filled 2020-11-19 (×7): qty 1

## 2020-11-19 MED ORDER — DIGOXIN 125 MCG PO TABS
0.1250 mg | ORAL_TABLET | Freq: Every day | ORAL | Status: DC
  Administered 2020-11-19 – 2020-11-21 (×3): 0.125 mg via ORAL
  Filled 2020-11-19 (×3): qty 1

## 2020-11-19 MED ORDER — ACETAMINOPHEN 650 MG RE SUPP: 650.0000 mg | Freq: Four times a day (QID) | RECTAL | Status: DC | PRN

## 2020-11-19 MED ORDER — PANTOPRAZOLE SODIUM 40 MG PO TBEC
40.0000 mg | DELAYED_RELEASE_TABLET | Freq: Every day | ORAL | Status: DC
  Administered 2020-11-19 – 2020-11-21 (×3): 40 mg via ORAL
  Filled 2020-11-19: qty 1

## 2020-11-19 NOTE — ED Triage Notes (Signed)
Patient BIB EMS and presents with a  cough and vomiting x 2 days. Pt went to [facility] ED two days ago and got blood work and CXR but left prior to being seen.  Pt dx with pneumonia 2 weeks ago. Hx Heart failure. EMS noted that pt has a strong productive cough that results in dry heaving. Pt does report diffuse pain across the chest abdomen, and left flank after a coughing spell.  EMS EKG showed NSR w/ PVCs. EMS gave patient 4mg  IV zofran via a 20g IV in left hand.  EMS VS:   134/86, HR: 100, 98%RA, 61F, CBG: 155.

## 2020-11-19 NOTE — Plan of Care (Signed)

## 2020-11-19 NOTE — Progress Notes (Signed)
Brief Pharmacy Note: hold criteria for Dapagliflozin  Acute renal failure eGFR < 45 mL/min/1.97m (canagliflozin INVOKANA, ertugliflozin STEGLATRO) eGFR < 30 mL/min/1.742m(empagliflozin JARDIANCE, dapagliflozin FARXIGA) Diabetic ketoacidosis Metabolic acidosis NPO status UTI Dehydration Volume depletion  SCr > 3, AKI eGFR = 24, < 30 ml/min  Will hold Dapagliflozin while renal fx inadequate for medication use  Thank you,  GrMinda DittoharmD 11/19/2020, 4:28 PM

## 2020-11-19 NOTE — H&P (Signed)
History and Physical    Kenric Ginger WPV:[id number] DOB: 07-May-1981 DOA: 11/19/2020  PCP: Flossie Buffy, NP  Patient coming from: Home  Chief Complaint: chest pain  HPI: Robert [name] is a 40 y.o. male with medical history significant of combined HF, asthma, GERD, depression. Presenting with chest, nausea and vomiting. He reports that he's had non-radiating right side chest pain. It is episodic lasting up to hours at a time. He didn't try any medicine to help his symptoms. He says he's also had N/V/D all during that time. He has not tried any medication to control those symptoms either. He tried going to another ED a couple of days ago, but didn't want to wait. When his symptoms didn't resolve this morning, he decided to come to the ED again.    ED Course: BNP was normal. First troponin was negative. He was found to have an elevated SCr. TRH was called for admission.    Review of Systems:  Denies dyspnea, palpitations, abdominal pain, lightheaded, dizziness, fevers, sick contact. Review of systems is otherwise negative for all not mentioned in HPI.   PMHx Past Medical History:  Diagnosis Date   Alcohol use    Alcohol withdrawal ([place]) 03/06/2020   Asthma    CHF (congestive heart failure) (HCC)     PSHx Past Surgical History:  Procedure Laterality Date   ESOPHAGOGASTRODUODENOSCOPY (EGD) WITH PROPOFOL N/A 11/01/2019   Procedure: ESOPHAGOGASTRODUODENOSCOPY (EGD) WITH PROPOFOL;  Surgeon: [name], MD;  Location: [facility] ENDOSCOPY;  Service: Endoscopy;  Laterality: N/A;   RIGHT/LEFT HEART CATH AND CORONARY ANGIOGRAPHY N/A 10/06/2019   Procedure: RIGHT/LEFT HEART CATH AND CORONARY ANGIOGRAPHY;  Surgeon: [name], MD;  Location: [facility];  Service: Cardiovascular;  Laterality: N/A;   WRIST SURGERY      SocHx  reports that he has been smoking cigarettes. He has a 6.00 pack-year smoking history. He has never used smokeless tobacco. He reports current alcohol  use of about 8.0 standard drinks of alcohol per week. He reports current drug use. Frequency: 1.00 time per week. Drug: Marijuana.  Allergies  Allergen Reactions   Bactrim [Sulfamethoxazole-Trimethoprim] Rash    FamHx Family History  Problem Relation Age of Onset   Heart failure Father    Diabetes Father    Kidney disease Father    Heart disease Father 59   Heart disease Maternal Grandfather 80   Heart disease Paternal Grandfather 54    Prior to Admission medications   Medication Sig Start Date End Date Taking? Authorizing Provider  carvedilol (COREG) 3.125 MG tablet Take 1 tablet (3.125 mg total) by mouth 2 (two) times daily. 04/11/20 04/11/21 Yes Clegg, Amy D, NP  dapagliflozin propanediol (FARXIGA) 10 MG TABS tablet Take 1 tablet (10 mg total) by mouth daily before breakfast. 10/30/20  Yes Milford, Maricela Bo, FNP  digoxin (LANOXIN) 0.125 MG tablet Take 1 tablet (0.125 mg total) by mouth daily. 06/14/20  Yes Lyda Jester M, PA-C  folic acid (FOLVITE) 1 MG tablet Take 1 tablet (1 mg total) by mouth daily. 03/16/20  Yes Bensimhon, Shaune Pascal, MD  ivabradine (CORLANOR) 7.5 MG TABS tablet Take 1 tablet (7.5 mg total) by mouth 2 (two) times daily with a meal. 10/30/20  Yes [place], [place], FNP  magnesium oxide (MAG-OX) 400 MG tablet Take 400 mg by mouth daily.   Yes [provider]  nicotine (NICODERM CQ - DOSED IN MG/24 HOURS) 21 mg/24hr patch Place 1 patch (21 mg total) onto  the skin daily. 10/13/20  Yes Lavina Hamman, MD  ondansetron (ZOFRAN ODT) 4 MG disintegrating tablet Take 1 tablet (4 mg total) by mouth every 8 (eight) hours as needed for nausea or vomiting. 08/20/20  Yes Arrien, Jimmy Picket, MD  pantoprazole (PROTONIX) 40 MG tablet Take 1 tablet (40 mg total) by mouth daily. 10/20/20 11/19/20 Yes Nche, Charlene Brooke, NP  sacubitril-valsartan (ENTRESTO) 97-103 MG Take 1 tablet by mouth 2 (two) times daily. 06/16/20  Yes Lyda Jester M, PA-C  spironolactone  (ALDACTONE) 25 MG tablet Take 1 tablet (25 mg total) by mouth daily. 10/20/20  Yes Nche, Charlene Brooke, NP  sucralfate (CARAFATE) 1 g tablet Take 1 tablet (1 g total) by mouth 4 (four) times daily -  with meals and at bedtime. 07/31/20  Yes Bonnielee Haff, MD  thiamine 100 MG tablet Take 1 tablet (100 mg total) by mouth daily. 07/31/20  Yes Bonnielee Haff, MD  busPIRone (BUSPAR) 5 MG tablet Take 1 tablet (5 mg total) by mouth 3 (three) times daily. Patient not taking: Reported on 11/19/2020 10/13/20   Lavina Hamman, MD    Physical Exam: Vitals:   11/19/20 1134 11/19/20 1200 11/19/20 1245 11/19/20 1345  BP: 105/70 100/60 105/70 103/70  Pulse: 84 89 86 80  Resp: 18 18 18 18   Temp: 97.7 F (36.5 C)     TempSrc: Oral     SpO2: 97% 99% 96% 98%  Weight:      Height:        General: 40 y.o. male resting in bed in NAD Eyes: PERRL, normal sclera ENMT: Nares patent w/o discharge, orophaynx clear, dentition normal, ears w/o discharge/lesions/ulcers Neck: Supple, trachea midline Cardiovascular: RRR, +S1, S2, no m/g/r, equal pulses throughout; reproducible chest pain on exam Respiratory: CTABL, no w/r/r, normal WOB GI: BS+, NDNT, no masses noted, no organomegaly noted MSK: No e/c/c Neuro: A&O x 3, no focal deficits Psyc: Appropriate interaction and affect, calm/cooperative  Labs on Admission: I have personally reviewed following labs and imaging studies  CBC: Recent Labs  Lab 11/17/20 1435 11/19/20 1230  WBC 5.2 5.3  NEUTROABS 3.9 4.1  HGB 12.4* 12.4*  HCT 38.4* 37.5*  MCV 88.1 86.0  PLT 366 161   Basic Metabolic Panel: Recent Labs  Lab 11/17/20 1435 11/19/20 1230  NA 137 136  K 3.9 3.6  CL 96* 93*  CO2 26 28  GLUCOSE 153* 113*  BUN 20 27*  CREATININE 1.37* 3.20*  CALCIUM 9.8 9.4   GFR: Estimated Creatinine Clearance: 34.7 mL/min (A) (by C-G formula based on SCr of 3.2 mg/dL (H)). Liver Function Tests: Recent Labs  Lab 11/17/20 1435 11/19/20 1230  AST 43* 38  ALT  51* 52*  ALKPHOS 50 52  BILITOT 1.4* 0.9  PROT 8.3* 8.5*  ALBUMIN 4.8 5.1*   Recent Labs  Lab 11/17/20 1435 11/19/20 1230  LIPASE 28 26   No results for input(s): AMMONIA in the last 168 hours. Coagulation Profile: No results for input(s): INR, PROTIME in the last 168 hours. Cardiac Enzymes: No results for input(s): CKTOTAL, CKMB, CKMBINDEX, TROPONINI in the last 168 hours. BNP (last 3 results) No results for input(s): PROBNP in the last 8760 hours. HbA1C: No results for input(s): HGBA1C in the last 72 hours. CBG: No results for input(s): GLUCAP in the last 168 hours. Lipid Profile: No results for input(s): CHOL, HDL, LDLCALC, TRIG, CHOLHDL, LDLDIRECT in the last 72 hours. Thyroid Function Tests: No results for input(s): TSH, T4TOTAL, FREET4, T3FREE,  THYROIDAB in the last 72 hours. Anemia Panel: No results for input(s): VITAMINB12, FOLATE, FERRITIN, TIBC, IRON, RETICCTPCT in the last 72 hours. Urine analysis:    Component Value Date/Time   COLORURINE YELLOW 10/08/2020 2048   APPEARANCEUR CLEAR 10/08/2020 2048   LABSPEC >1.046 (H) 10/08/2020 2048   PHURINE 6.0 10/08/2020 2048   GLUCOSEU >=500 (A) 10/08/2020 2048   HGBUR NEGATIVE 10/08/2020 2048   BILIRUBINUR NEGATIVE 10/08/2020 2048   KETONESUR 80 (A) 10/08/2020 2048   PROTEINUR 100 (A) 10/08/2020 2048   NITRITE NEGATIVE 10/08/2020 2048   LEUKOCYTESUR NEGATIVE 10/08/2020 2048    Radiological Exams on Admission: DG Chest 2 View  Result Date: 11/17/2020 CLINICAL DATA:  Shortness of breath, chest pain, emesis. EXAM: CHEST - 2 VIEW COMPARISON:  Chest x-ray 10/08/2020, CT chest 10/08/2020 FINDINGS: The heart size and mediastinal contours are within normal limits. No focal consolidation. No pulmonary edema. No pleural effusion. No pneumothorax. No acute osseous abnormality. Similar-appearing midthoracic compression fracture. IMPRESSION: No active cardiopulmonary disease. Electronically Signed   By: Iven Finn M.D.   On:  11/17/2020 15:22   DG Chest Port 1 View  Result Date: 11/19/2020 CLINICAL DATA:  Shortness of breath. Cough for the past 2 days. History of asthma. EXAM: PORTABLE CHEST 1 VIEW COMPARISON:  11/17/2020 FINDINGS: Stable borderline enlarged cardiac silhouette with a poor inspiration noted. Clear lungs with normal vascularity. Unremarkable bones. IMPRESSION: Stable borderline cardiomegaly.  No acute abnormality. Electronically Signed   By: Claudie Revering M.D.   On: 11/19/2020 12:07    EKG: Independently reviewed. Sinus, no st elevations; largely unchanged from previous  Assessment/Plan AKI     - place in obs, med-sug     - give 1L gentle fluids for now     - check renal US     - hold entresto, aldactone  Chronic systolic HF Chest pain     - not in exacerbation; he is a little dry     - give 1L gentle fluids     - hold entresto, aldactone     - daily wts, I&O     - check dig level, EKG, trp  N/V Diarrhea     - anti-emetics     - check c diff, GI PCR (recent abx); if negative, can add anti-diarrheals  GERD     - sucralfate, protonix  DVT prophylaxis: lovenox Code Status: FULL  Family Communication: None at bedside  Consults called: None   Status is: Observation  The patient remains OBS appropriate and will d/c before 2 midnights.  Dispo: The patient is from: Home              Anticipated d/c is to: Home              Patient currently is not medically stable to d/c.   Difficult to place patient No  Time spent coordinating admission: 45 minutes  [facility]  If 7PM-7AM, please contact night-coverage [email or web address]  11/19/2020, 2:20 PM

## 2020-11-19 NOTE — ED Provider Notes (Addendum)
[facility] DEPT Provider Note   CSN: [id number] Arrival date & time: 11/19/20  1103     History No chief complaint on file.   Robert [name] is a 40 y.o. male.  Patient complains of a cough and vomiting for couple days.  Patient has a history of congestive heart failure  The history is provided by the patient and medical records. No language interpreter was used.  Emesis Severity:  Moderate Duration: 3 days. Quality:  Undigested food Able to tolerate:  Liquids Progression:  Worsening Chronicity:  New Recent urination:  Normal Context: post-tussive   Relieved by:  Nothing Worsened by:  Nothing Ineffective treatments:  None tried Associated symptoms: no abdominal pain, no cough, no diarrhea and no headaches       Past Medical History:  Diagnosis Date   Alcohol use    Alcohol withdrawal ([place]) 03/06/2020   Asthma    CHF (congestive heart failure) [facility])     Patient Active Problem List   Diagnosis Date Noted   Sepsis without acute organ dysfunction ([place])    Pneumonia 10/08/2020   Severe sepsis ([place]) 10/08/2020   Delirium tremens ([place]) 09/02/2020   Hypomagnesemia 08/19/2020   Seizure ([place]) 07/10/2020   Mood disorder ([place]) 05/16/2020   Hypotension 03/24/2020   Polysubstance abuse ([place]) 03/24/2020   Nausea & vomiting 03/06/2020   AKI (acute kidney injury) ([place]) 10/30/2019   Gastritis 10/30/2019   Elevated INR 10/30/2019   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[place])    Acute heart failure ([place]) 10/05/2019   Pulmonary embolism ([place]) 97/67/3419   Chronic systolic CHF (congestive heart failure) (HCC)    Inappropriate sinus tachycardia    Chest pain 11/03/2013   Tobacco abuse 11/03/2013    Past Surgical History:  Procedure Laterality Date   ESOPHAGOGASTRODUODENOSCOPY (EGD) WITH PROPOFOL N/A 11/01/2019   Procedure: ESOPHAGOGASTRODUODENOSCOPY (EGD) WITH PROPOFOL;  Surgeon: [name], MD;  Location: [facility]  ENDOSCOPY;  Service: Endoscopy;  Laterality: N/A;   RIGHT/LEFT HEART CATH AND CORONARY ANGIOGRAPHY N/A 10/06/2019   Procedure: RIGHT/LEFT HEART CATH AND CORONARY ANGIOGRAPHY;  Surgeon: [name], MD;  Location: [facility];  Service: Cardiovascular;  Laterality: N/A;   WRIST SURGERY         Family History  Problem Relation Age of Onset   Heart failure Father    Diabetes Father    Kidney disease Father    Heart disease Father 33   Heart disease Maternal Grandfather 83   Heart disease Paternal Grandfather 27    Social History   Tobacco Use   Smoking status: Every Day    Packs/day: 0.25    Years: 24.00    Pack years: 6.00    Types: Cigarettes   Smokeless tobacco: Never  Vaping Use   Vaping Use: Former  Substance Use Topics   Alcohol use: Yes    Alcohol/week: 8.0 standard drinks    Types: 8 Shots of liquor per week    Comment: heavy use since age 39   Drug use: Yes    Frequency: 1.0 times per week    Types: Marijuana    Home Medications Prior to Admission medications   Medication Sig Start Date End Date Taking? Authorizing Provider  carvedilol (COREG) 3.125 MG tablet Take 1 tablet (3.125 mg total) by mouth 2 (two) times daily. 04/11/20 04/11/21 Yes Clegg, Amy D, NP  dapagliflozin propanediol (FARXIGA) 10 MG TABS tablet Take 1 tablet (10 mg total) by mouth  daily before breakfast. 10/30/20  Yes Milford, Maricela Bo, FNP  digoxin (LANOXIN) 0.125 MG tablet Take 1 tablet (0.125 mg total) by mouth daily. 06/14/20  Yes Lyda Jester M, PA-C  folic acid (FOLVITE) 1 MG tablet Take 1 tablet (1 mg total) by mouth daily. 03/16/20  Yes Bensimhon, Shaune Pascal, MD  ivabradine (CORLANOR) 7.5 MG TABS tablet Take 1 tablet (7.5 mg total) by mouth 2 (two) times daily with a meal. 10/30/20  Yes [place], [place], FNP  magnesium oxide (MAG-OX) 400 MG tablet Take 400 mg by mouth daily.   Yes [provider]  nicotine (NICODERM CQ - DOSED IN MG/24 HOURS) 21 mg/24hr patch Place 1  patch (21 mg total) onto the skin daily. 10/13/20  Yes Lavina Hamman, MD  ondansetron (ZOFRAN ODT) 4 MG disintegrating tablet Take 1 tablet (4 mg total) by mouth every 8 (eight) hours as needed for nausea or vomiting. 08/20/20  Yes Arrien, Jimmy Picket, MD  pantoprazole (PROTONIX) 40 MG tablet Take 1 tablet (40 mg total) by mouth daily. 10/20/20 11/19/20 Yes Nche, Charlene Brooke, NP  sacubitril-valsartan (ENTRESTO) 97-103 MG Take 1 tablet by mouth 2 (two) times daily. 06/16/20  Yes Lyda Jester M, PA-C  spironolactone (ALDACTONE) 25 MG tablet Take 1 tablet (25 mg total) by mouth daily. 10/20/20  Yes Nche, Charlene Brooke, NP  sucralfate (CARAFATE) 1 g tablet Take 1 tablet (1 g total) by mouth 4 (four) times daily -  with meals and at bedtime. 07/31/20  Yes Bonnielee Haff, MD  thiamine 100 MG tablet Take 1 tablet (100 mg total) by mouth daily. 07/31/20  Yes Bonnielee Haff, MD  busPIRone (BUSPAR) 5 MG tablet Take 1 tablet (5 mg total) by mouth 3 (three) times daily. Patient not taking: Reported on 11/19/2020 10/13/20   Lavina Hamman, MD    Allergies    Bactrim [sulfamethoxazole-trimethoprim]  Review of Systems   Review of Systems  Constitutional:  Negative for appetite change and fatigue.  HENT:  Negative for congestion, ear discharge and sinus pressure.   Eyes:  Negative for discharge.  Respiratory:  Negative for cough.   Cardiovascular:  Negative for chest pain.  Gastrointestinal:  Positive for vomiting. Negative for abdominal pain and diarrhea.  Genitourinary:  Negative for frequency and hematuria.  Musculoskeletal:  Negative for back pain.  Skin:  Negative for rash.  Neurological:  Negative for seizures and headaches.  Psychiatric/Behavioral:  Negative for hallucinations.    Physical Exam Updated Vital Signs BP 103/70   Pulse 80   Temp 97.7 F (36.5 C) (Oral)   Resp 18   Ht 5\' 10"  (1.778 m)   Wt 88.5 kg   SpO2 98%   BMI 27.98 kg/m   Physical Exam Vitals and nursing note  reviewed.  Constitutional:      Appearance: He is well-developed.  HENT:     Head: Normocephalic.     Nose: Nose normal.  Eyes:     General: No scleral icterus.    Conjunctiva/sclera: Conjunctivae normal.  Neck:     Thyroid: No thyromegaly.  Cardiovascular:     Rate and Rhythm: Normal rate and regular rhythm.     Heart sounds: No murmur heard.   No friction rub. No gallop.  Pulmonary:     Breath sounds: No stridor. No wheezing or rales.  Chest:     Chest wall: No tenderness.  Abdominal:     General: There is no distension.     Tenderness: There is no  abdominal tenderness. There is no rebound.  Musculoskeletal:        General: Normal range of motion.     Cervical back: Neck supple.  Lymphadenopathy:     Cervical: No cervical adenopathy.  Skin:    General: Skin is warm.     Findings: No erythema or rash.  Neurological:     Mental Status: He is alert and oriented to person, place, and time.     Motor: No abnormal muscle tone.     Coordination: Coordination normal.  Psychiatric:        Behavior: Behavior normal.    ED Results / Procedures / Treatments   Labs (all labs ordered are listed, but only abnormal results are displayed) Labs Reviewed  CBC WITH DIFFERENTIAL/PLATELET - Abnormal; Notable for the following components:      Result Value   Hemoglobin 12.4 (*)    HCT 37.5 (*)    All other components within normal limits  COMPREHENSIVE METABOLIC PANEL - Abnormal; Notable for the following components:   Chloride 93 (*)    Glucose, Bld 113 (*)    BUN 27 (*)    Creatinine, Ser 3.20 (*)    Total Protein 8.5 (*)    Albumin 5.1 (*)    ALT 52 (*)    GFR, Estimated 24 (*)    All other components within normal limits  RESP PANEL BY RT-PCR (FLU A&B, COVID) ARPGX2  LIPASE, BLOOD  BRAIN NATRIURETIC PEPTIDE  TROPONIN I (HIGH SENSITIVITY)  TROPONIN I (HIGH SENSITIVITY)    EKG None  Radiology DG Chest 2 View  Result Date: 11/17/2020 CLINICAL DATA:  Shortness of  breath, chest pain, emesis. EXAM: CHEST - 2 VIEW COMPARISON:  Chest x-ray 10/08/2020, CT chest 10/08/2020 FINDINGS: The heart size and mediastinal contours are within normal limits. No focal consolidation. No pulmonary edema. No pleural effusion. No pneumothorax. No acute osseous abnormality. Similar-appearing midthoracic compression fracture. IMPRESSION: No active cardiopulmonary disease. Electronically Signed   By: Iven Finn M.D.   On: 11/17/2020 15:22   DG Chest Port 1 View  Result Date: 11/19/2020 CLINICAL DATA:  Shortness of breath. Cough for the past 2 days. History of asthma. EXAM: PORTABLE CHEST 1 VIEW COMPARISON:  11/17/2020 FINDINGS: Stable borderline enlarged cardiac silhouette with a poor inspiration noted. Clear lungs with normal vascularity. Unremarkable bones. IMPRESSION: Stable borderline cardiomegaly.  No acute abnormality. Electronically Signed   By: Claudie Revering M.D.   On: 11/19/2020 12:07    Procedures Procedures   Medications Ordered in ED Medications  sodium chloride 0.9 % bolus 500 mL (500 mLs Intravenous New Bag/Given 11/19/20 1232)  HYDROmorphone (DILAUDID) injection 0.5 mg (0.5 mg Intravenous Given 11/19/20 1231)  ondansetron (ZOFRAN) injection 4 mg (4 mg Intravenous Given 11/19/20 1231)    ED Course  I have reviewed the triage vital signs and the nursing notes.  Pertinent labs & imaging results that were available during my care of the patient were reviewed by me and considered in my medical decision making (see chart for details).    MDM Rules/Calculators/A&P                          Patient with AKI.  He will be admitted to medicine and hydrated Final Clinical Impression(s) / ED Diagnoses Final diagnoses:  AKI (acute kidney injury) ([place])    Rx / [place] Orders ED Discharge Orders     None        Franciscojavier Wronski,  Broadus John, MD 11/19/20 1642    Milton Ferguson, MD 11/19/20 [phone]

## 2020-11-20 ENCOUNTER — Other Ambulatory Visit: Payer: Self-pay

## 2020-11-20 DIAGNOSIS — E876 Hypokalemia: Secondary | ICD-10-CM | POA: Diagnosis present

## 2020-11-20 DIAGNOSIS — Z8249 Family history of ischemic heart disease and other diseases of the circulatory system: Secondary | ICD-10-CM | POA: Diagnosis not present

## 2020-11-20 DIAGNOSIS — N179 Acute kidney failure, unspecified: Secondary | ICD-10-CM | POA: Diagnosis present

## 2020-11-20 DIAGNOSIS — R197 Diarrhea, unspecified: Secondary | ICD-10-CM

## 2020-11-20 DIAGNOSIS — Z20822 Contact with and (suspected) exposure to covid-19: Secondary | ICD-10-CM | POA: Diagnosis present

## 2020-11-20 DIAGNOSIS — F17210 Nicotine dependence, cigarettes, uncomplicated: Secondary | ICD-10-CM | POA: Diagnosis present

## 2020-11-20 DIAGNOSIS — Z86711 Personal history of pulmonary embolism: Secondary | ICD-10-CM | POA: Diagnosis not present

## 2020-11-20 DIAGNOSIS — I5022 Chronic systolic (congestive) heart failure: Secondary | ICD-10-CM

## 2020-11-20 DIAGNOSIS — Z79899 Other long term (current) drug therapy: Secondary | ICD-10-CM | POA: Diagnosis not present

## 2020-11-20 DIAGNOSIS — E869 Volume depletion, unspecified: Secondary | ICD-10-CM | POA: Diagnosis present

## 2020-11-20 DIAGNOSIS — F32A Depression, unspecified: Secondary | ICD-10-CM | POA: Diagnosis present

## 2020-11-20 DIAGNOSIS — Z833 Family history of diabetes mellitus: Secondary | ICD-10-CM | POA: Diagnosis not present

## 2020-11-20 DIAGNOSIS — K219 Gastro-esophageal reflux disease without esophagitis: Secondary | ICD-10-CM | POA: Diagnosis present

## 2020-11-20 DIAGNOSIS — J45909 Unspecified asthma, uncomplicated: Secondary | ICD-10-CM | POA: Diagnosis present

## 2020-11-20 LAB — CBC
HCT: 35.6 % — ABNORMAL LOW (ref 39.0–52.0)
Hemoglobin: 11.3 g/dL — ABNORMAL LOW (ref 13.0–17.0)
MCH: 28.5 pg (ref 26.0–34.0)
MCHC: 31.7 g/dL (ref 30.0–36.0)
MCV: 89.7 fL (ref 80.0–100.0)
Platelets: 246 10*3/uL (ref 150–400)
RBC: 3.97 MIL/uL — ABNORMAL LOW (ref 4.22–5.81)
RDW: 14.6 % (ref 11.5–15.5)
WBC: 3.9 10*3/uL — ABNORMAL LOW (ref 4.0–10.5)
nRBC: 0 % (ref 0.0–0.2)

## 2020-11-20 LAB — COMPREHENSIVE METABOLIC PANEL
ALT: 40 U/L (ref 0–44)
AST: 28 U/L (ref 15–41)
Albumin: 4.2 g/dL (ref 3.5–5.0)
Alkaline Phosphatase: 42 U/L (ref 38–126)
Anion gap: 13 (ref 5–15)
BUN: 25 mg/dL — ABNORMAL HIGH (ref 6–20)
CO2: 27 mmol/L (ref 22–32)
Calcium: 8.5 mg/dL — ABNORMAL LOW (ref 8.9–10.3)
Chloride: 96 mmol/L — ABNORMAL LOW (ref 98–111)
Creatinine, Ser: 1.88 mg/dL — ABNORMAL HIGH (ref 0.61–1.24)
GFR, Estimated: 46 mL/min — ABNORMAL LOW (ref >60)
Glucose, Bld: 82 mg/dL (ref 70–99)
Potassium: 3.1 mmol/L — ABNORMAL LOW (ref 3.5–5.1)
Sodium: 136 mmol/L (ref 135–145)
Total Bilirubin: 1.1 mg/dL (ref 0.3–1.2)
Total Protein: 7.2 g/dL (ref 6.5–8.1)

## 2020-11-20 MED ORDER — SODIUM CHLORIDE 0.9 % IV SOLN: INTRAVENOUS | Status: AC

## 2020-11-20 NOTE — Assessment & Plan Note (Addendum)
-   No signs/symptoms of exacerbation.  Patient appeared volume depleted on admission due to GI losses - Echo reviewed from 07/10/2020: EF 30%, grade 1 diastolic dysfunction, global hypokinesis, dilated LV -Entresto and spironolactone on hold for now, will resume at discharge

## 2020-11-20 NOTE — Assessment & Plan Note (Addendum)
Repleted. °

## 2020-11-20 NOTE — Assessment & Plan Note (Addendum)
-   baseline creatinine ~ 0.8-1 - patient presents with increase in creat >0.3 mg/dL above baseline, creat increase >1.5x baseline presumed to have occurred within past 7 days PTA -Presumed due to volume depletion from GI losses in setting of N/V/D - continue low rate fluids and monitor for any respiratory symptoms/shortness of breath -Creatinine has been improving.  Peaked at 3.2 -Creatinine improved to 1.25 at time of discharge

## 2020-11-20 NOTE — Assessment & Plan Note (Signed)
-   Seems to be improving.  No further diarrhea - Cancel stool studies unless diarrhea recurs

## 2020-11-20 NOTE — Progress Notes (Signed)
Progress Note    Robert [name]   IHK:[id number]  DOB: Mar 07, 1981  DOA: 11/19/2020     0  PCP: Flossie Buffy, NP  CC: N/V/D  Hospital Course: Robert [name] is a 40 y.o. male with medical history significant of combined HF, asthma, GERD, depression. Presenting with diarrhea, nausea and vomiting.  There was also a complaint of some right-sided chest pain on admission which has seemed to have resolved. He also denied any further diarrhea the morning following admission and stool studies were canceled.  His nausea/vomiting was also improving and he was tolerating eating breakfast. He underwent work-up on admission and was found to have an elevated creatinine well above baseline and was started on low rate IV fluids in setting of his underlying CHF.  Interval History:  Resting in bed when seen this morning.  States that his diarrhea has improved and he is no longer nauseous or vomiting.  Breakfast tray in front of him and he was eating.  Discussed that we would continue fluids through today and hopeful for discharging tomorrow if further renal recovery.  ROS: Constitutional: negative for chills and fevers, Respiratory: negative for cough, Cardiovascular: negative for chest pain, and Gastrointestinal: negative for abdominal pain  Assessment & Plan: * AKI (acute kidney injury) ([place]) - baseline creatinine ~ 0.8-1 - patient presents with increase in creat >0.3 mg/dL above baseline, creat increase >1.5x baseline presumed to have occurred within past 7 days PTA -Presumed due to volume depletion from GI losses in setting of N/V/D - continue low rate fluids and monitor for any respiratory symptoms/shortness of breath -Creatinine has been improving.  Peaked at 3.2  Diarrhea-resolved as of 11/20/2020 - Seems to be improving.  No further diarrhea - Cancel stool studies unless diarrhea recurs  Hypokalemia - Replete and recheck as needed  Chronic systolic CHF (congestive heart  failure) (HCC) - No signs/symptoms of exacerbation.  Patient appeared volume depleted on admission due to GI losses - Echo reviewed from 07/10/2020: EF 75%, grade 1 diastolic dysfunction, global hypokinesis, dilated LV -Entresto and spironolactone on hold for now, will resume at discharge    Old records reviewed in assessment of this patient  Antimicrobials:   DVT prophylaxis: enoxaparin (LOVENOX) injection 40 mg Start: 11/19/20 1800   Code Status:   Code Status: Full Code Family Communication:   Disposition Plan: Status is: Observation  The patient will require care spanning > 2 midnights and should be moved to inpatient because: IV treatments appropriate due to intensity of illness or inability to take PO and Inpatient level of care appropriate due to severity of illness  Dispo: The patient is from: Home              Anticipated d/c is to: Home              Patient currently is not medically stable to d/c.   Difficult to place patient No      Risk of unplanned readmission score:     Objective: Blood pressure 115/85, pulse (!) 55, temperature 98.1 F (36.7 C), temperature source Oral, resp. rate 14, height 5\' 10"  (1.778 m), weight 88.6 kg, SpO2 96 %.  Examination: General appearance: alert and cooperative Head: Normocephalic, without obvious abnormality, atraumatic Eyes:  EOMI Lungs: clear to auscultation bilaterally Heart: regular rate and rhythm and S1, S2 normal Abdomen:  Soft, nontender, nondistended, bowel sounds present Extremities:  No edema Skin: mobility and turgor normal Neurologic: Grossly normal  Consultants:  Procedures:    Data Reviewed: I have personally reviewed following labs and imaging studies Results for orders placed or performed during the hospital encounter of 11/19/20 (from the past 24 hour(s))  Resp Panel by RT-PCR (Flu A&B, Covid) Nasopharyngeal Swab     Status: None   Collection Time: 11/19/20  3:20 PM   Specimen: Nasopharyngeal  Swab; Nasopharyngeal(NP) swabs in vial transport medium  Result Value Ref Range   SARS Coronavirus 2 by RT PCR NEGATIVE NEGATIVE   Influenza A by PCR NEGATIVE NEGATIVE   Influenza B by PCR NEGATIVE NEGATIVE  CBC     Status: Abnormal   Collection Time: 11/19/20  4:17 PM  Result Value Ref Range   WBC 7.5 4.0 - 10.5 K/uL   RBC 4.43 4.22 - 5.81 MIL/uL   Hemoglobin 12.8 (L) 13.0 - 17.0 g/dL   HCT 39.2 39.0 - 52.0 %   MCV 88.5 80.0 - 100.0 fL   MCH 28.9 26.0 - 34.0 pg   MCHC 32.7 30.0 - 36.0 g/dL   RDW 14.8 11.5 - 15.5 %   Platelets 311 150 - 400 K/uL   nRBC 0.0 0.0 - 0.2 %  Creatinine, serum     Status: Abnormal   Collection Time: 11/19/20  4:17 PM  Result Value Ref Range   Creatinine, Ser 2.92 (H) 0.61 - 1.24 mg/dL   GFR, Estimated 27 (L) >60 mL/min  Digoxin level     Status: Abnormal   Collection Time: 11/19/20  4:17 PM  Result Value Ref Range   Digoxin Level 0.4 (L) 0.8 - 2.0 ng/mL  Magnesium     Status: None   Collection Time: 11/19/20  4:17 PM  Result Value Ref Range   Magnesium 1.8 1.7 - 2.4 mg/dL  Comprehensive metabolic panel     Status: Abnormal   Collection Time: 11/20/20  5:14 AM  Result Value Ref Range   Sodium 136 135 - 145 mmol/L   Potassium 3.1 (L) 3.5 - 5.1 mmol/L   Chloride 96 (L) 98 - 111 mmol/L   CO2 27 22 - 32 mmol/L   Glucose, Bld 82 70 - 99 mg/dL   BUN 25 (H) 6 - 20 mg/dL   Creatinine, Ser 1.88 (H) 0.61 - 1.24 mg/dL   Calcium 8.5 (L) 8.9 - 10.3 mg/dL   Total Protein 7.2 6.5 - 8.1 g/dL   Albumin 4.2 3.5 - 5.0 g/dL   AST 28 15 - 41 U/L   ALT 40 0 - 44 U/L   Alkaline Phosphatase 42 38 - 126 U/L   Total Bilirubin 1.1 0.3 - 1.2 mg/dL   GFR, Estimated 46 (L) >60 mL/min   Anion gap 13 5 - 15  CBC     Status: Abnormal   Collection Time: 11/20/20  5:14 AM  Result Value Ref Range   WBC 3.9 (L) 4.0 - 10.5 K/uL   RBC 3.97 (L) 4.22 - 5.81 MIL/uL   Hemoglobin 11.3 (L) 13.0 - 17.0 g/dL   HCT 35.6 (L) 39.0 - 52.0 %   MCV 89.7 80.0 - 100.0 fL   MCH 28.5 26.0  - 34.0 pg   MCHC 31.7 30.0 - 36.0 g/dL   RDW 14.6 11.5 - 15.5 %   Platelets 246 150 - 400 K/uL   nRBC 0.0 0.0 - 0.2 %    Recent Results (from the past 240 hour(s))  Resp Panel by RT-PCR (Flu A&B, Covid) Nasopharyngeal Swab     Status: None   Collection Time: 11/19/20  3:20 PM   Specimen: Nasopharyngeal Swab; Nasopharyngeal(NP) swabs in vial transport medium  Result Value Ref Range Status   SARS Coronavirus 2 by RT PCR NEGATIVE NEGATIVE Final    Comment: (NOTE) SARS-CoV-2 target nucleic acids are NOT DETECTED.  The SARS-CoV-2 RNA is generally detectable in upper respiratory specimens during the acute phase of infection. The lowest concentration of SARS-CoV-2 viral copies this assay can detect is 138 copies/mL. A negative result does not preclude SARS-Cov-2 infection and should not be used as the sole basis for treatment or other patient management decisions. A negative result may occur with  improper specimen collection/handling, submission of specimen other than nasopharyngeal swab, presence of viral mutation(s) within the areas targeted by this assay, and inadequate number of viral copies(<138 copies/mL). A negative result must be combined with clinical observations, patient history, and epidemiological information. The expected result is Negative.  Fact Sheet for Patients:  EntrepreneurPulse.com.au  Fact Sheet for Healthcare Providers:  IncredibleEmployment.be  This test is no t yet approved or cleared by the [place] FDA and  has been authorized for detection and/or diagnosis of SARS-CoV-2 by FDA under an Emergency Use Authorization (EUA). This EUA will remain  in effect (meaning this test can be used) for the duration of the COVID-19 declaration under Section 564(b)(1) of the Act, 21 U.S.C.section 360bbb-3(b)(1), unless the authorization is terminated  or revoked sooner.       Influenza A by PCR NEGATIVE NEGATIVE Final    Influenza B by PCR NEGATIVE NEGATIVE Final    Comment: (NOTE) The Xpert Xpress SARS-CoV-2/FLU/RSV plus assay is intended as an aid in the diagnosis of influenza from Nasopharyngeal swab specimens and should not be used as a sole basis for treatment. Nasal washings and aspirates are unacceptable for Xpert Xpress SARS-CoV-2/FLU/RSV testing.  Fact Sheet for Patients: EntrepreneurPulse.com.au  Fact Sheet for Healthcare Providers: IncredibleEmployment.be  This test is not yet approved or cleared by the [place] FDA and has been authorized for detection and/or diagnosis of SARS-CoV-2 by FDA under an Emergency Use Authorization (EUA). This EUA will remain in effect (meaning this test can be used) for the duration of the COVID-19 declaration under Section 564(b)(1) of the Act, 21 U.S.C. section 360bbb-3(b)(1), unless the authorization is terminated or revoked.  Performed at [facility], [place] [address], [place],  [address]      Radiology Studies: US RENAL  Result Date: 11/19/2020 CLINICAL DATA:  Acute kidney injury. EXAM: RENAL / URINARY TRACT ULTRASOUND COMPLETE COMPARISON:  Abdomen and pelvis CT dated 10/08/2020 FINDINGS: Right Kidney: Renal measurements: 10.9 x 6.5 x 4.6 cm = volume: 168 mL. Echogenicity within normal limits. No mass or hydronephrosis visualized. Left Kidney: Renal measurements: 10.9 x 5.7 x 5.2 cm = volume: 170 mL. Echogenicity within normal limits. No mass or hydronephrosis visualized. Bladder: Appears normal for degree of bladder distention. Other: None. IMPRESSION: Normal examination. Electronically Signed   By: Claudie Revering M.D.   On: 11/19/2020 17:54   DG Chest Port 1 View  Result Date: 11/19/2020 CLINICAL DATA:  Shortness of breath. Cough for the past 2 days. History of asthma. EXAM: PORTABLE CHEST 1 VIEW COMPARISON:  11/17/2020 FINDINGS: Stable borderline enlarged cardiac silhouette with a poor  inspiration noted. Clear lungs with normal vascularity. Unremarkable bones. IMPRESSION: Stable borderline cardiomegaly.  No acute abnormality. Electronically Signed   By: Claudie Revering M.D.   On: 11/19/2020 12:07   US RENAL  Final Result    DG Chest Port 1  View  Final Result      Scheduled Meds:  carvedilol  3.125 mg Oral BID WC   digoxin  0.125 mg Oral Daily   enoxaparin (LOVENOX) injection  40 mg Subcutaneous K81N   folic acid  1 mg Oral Daily   ivabradine  7.5 mg Oral BID WC   nicotine  21 mg Transdermal Daily   pantoprazole  40 mg Oral Daily   sucralfate  1 g Oral TID WC & HS   thiamine  100 mg Oral Daily   PRN Meds: acetaminophen **OR** acetaminophen, HYDROcodone-acetaminophen, ondansetron **OR** ondansetron (ZOFRAN) IV Continuous Infusions:  sodium chloride 75 mL/hr at 11/20/20 1009     LOS: 0 days  Time spent: Greater than 50% of the 35 minute visit was spent in counseling/coordination of care for the patient as laid out in the A&P.   Dwyane Dee, MD [facility] 11/20/2020, 1:53 PM

## 2020-11-20 NOTE — Hospital Course (Signed)
Robert [name] is a 40 y.o. male with medical history significant of combined HF, asthma, GERD, depression. Presenting with diarrhea, nausea and vomiting.  There was also a complaint of some right-sided chest pain on admission which has seemed to have resolved. He also denied any further diarrhea the morning following admission and stool studies were canceled.  His nausea/vomiting was also improving and he was tolerating eating breakfast. He underwent work-up on admission and was found to have an elevated creatinine well above baseline and was started on low rate IV fluids in setting of his underlying CHF.

## 2020-11-21 LAB — CBC WITH DIFFERENTIAL/PLATELET
Abs Immature Granulocytes: 0.01 10*3/uL (ref 0.00–0.07)
Basophils Absolute: 0 10*3/uL (ref 0.0–0.1)
Basophils Relative: 1 %
Eosinophils Absolute: 0.2 10*3/uL (ref 0.0–0.5)
Eosinophils Relative: 7 %
HCT: 34.6 % — ABNORMAL LOW (ref 39.0–52.0)
Hemoglobin: 11 g/dL — ABNORMAL LOW (ref 13.0–17.0)
Immature Granulocytes: 0 %
Lymphocytes Relative: 36 %
Lymphs Abs: 1.1 10*3/uL (ref 0.7–4.0)
MCH: 29 pg (ref 26.0–34.0)
MCHC: 31.8 g/dL (ref 30.0–36.0)
MCV: 91.3 fL (ref 80.0–100.0)
Monocytes Absolute: 0.4 10*3/uL (ref 0.1–1.0)
Monocytes Relative: 14 %
Neutro Abs: 1.2 10*3/uL — ABNORMAL LOW (ref 1.7–7.7)
Neutrophils Relative %: 42 %
Platelets: 227 10*3/uL (ref 150–400)
RBC: 3.79 MIL/uL — ABNORMAL LOW (ref 4.22–5.81)
RDW: 14 % (ref 11.5–15.5)
WBC: 3 10*3/uL — ABNORMAL LOW (ref 4.0–10.5)
nRBC: 0 % (ref 0.0–0.2)

## 2020-11-21 LAB — BASIC METABOLIC PANEL
Anion gap: 9 (ref 5–15)
BUN: 19 mg/dL (ref 6–20)
CO2: 28 mmol/L (ref 22–32)
Calcium: 8.6 mg/dL — ABNORMAL LOW (ref 8.9–10.3)
Chloride: 101 mmol/L (ref 98–111)
Creatinine, Ser: 1.25 mg/dL — ABNORMAL HIGH (ref 0.61–1.24)
GFR, Estimated: >60 mL/min (ref >60)
Glucose, Bld: 86 mg/dL (ref 70–99)
Potassium: 3.6 mmol/L (ref 3.5–5.1)
Sodium: 138 mmol/L (ref 135–145)

## 2020-11-21 LAB — MAGNESIUM: Magnesium: 1.8 mg/dL (ref 1.7–2.4)

## 2020-11-21 MED ORDER — ENTRESTO 97-103 MG PO TABS: 1.0000 | ORAL_TABLET | Freq: Two times a day (BID) | ORAL | 3 refills | Status: DC

## 2020-11-21 MED ORDER — FOLIC ACID 1 MG PO TABS: 1.0000 mg | ORAL_TABLET | Freq: Every day | ORAL | 3 refills | Status: DC

## 2020-11-21 NOTE — Discharge Summary (Signed)
Physician Discharge Summary   Robert [name] QBH:[id number] DOB: 1980/07/06 DOA: 11/19/2020  PCP: Flossie Buffy, NP  Admit date: 11/19/2020 Discharge date: 11/21/2020   Admitted From: home Disposition:  home Discharging physician: Dwyane Dee, MD  Recommendations for Outpatient Follow-up:  Continue following with cardiology  Home Health:  Equipment/Devices:   Patient discharged to home in Discharge Condition: stable Risk of unplanned readmission score: Unplanned Admission- Pilot do not use: 53.25  CODE STATUS: Full Diet recommendation:  Diet Orders (From admission, onward)     Start     Ordered   11/21/20 0000  Diet - low sodium heart healthy        11/21/20 0916   11/19/20 1607  Diet Heart Room service appropriate? Yes; Fluid consistency: Thin  Diet effective now       Question Answer Comment  Room service appropriate? Yes   Fluid consistency: Thin      11/19/20 [facility]: Robert [name] is a 40 y.o. male with medical history significant of combined HF, asthma, GERD, depression. Presenting with diarrhea, nausea and vomiting.  There was also a complaint of some right-sided chest pain on admission which has seemed to have resolved. He also denied any further diarrhea the morning following admission and stool studies were canceled.  His nausea/vomiting was also improving and he was tolerating eating breakfast. He underwent work-up on admission and was found to have an elevated creatinine well above baseline and was started on low rate IV fluids in setting of his underlying CHF.  * AKI (acute kidney injury) ([place]) - baseline creatinine ~ 0.8-1 - patient presents with increase in creat >0.3 mg/dL above baseline, creat increase >1.5x baseline presumed to have occurred within past 7 days PTA -Presumed due to volume depletion from GI losses in setting of N/V/D - continue low rate fluids and monitor for any respiratory symptoms/shortness of  breath -Creatinine has been improving.  Peaked at 3.2 -Creatinine improved to 1.25 at time of discharge  Diarrhea-resolved as of 11/20/2020 - Seems to be improving.  No further diarrhea - Cancel stool studies unless diarrhea recurs  Hypokalemia - Repleted  Chronic systolic CHF (congestive heart failure) (HCC) - No signs/symptoms of exacerbation.  Patient appeared volume depleted on admission due to GI losses - Echo reviewed from 07/10/2020: EF 09%, grade 1 diastolic dysfunction, global hypokinesis, dilated LV -Entresto and spironolactone on hold for now, will resume at discharge    The patient's chronic medical conditions were treated accordingly per the patient's home medication regimen except as noted.  On day of discharge, patient was felt deemed stable for discharge. Patient/family member advised to call PCP or come back to ER if needed.   Principal Diagnosis: AKI (acute kidney injury) [facility])  Discharge Diagnoses: Active Hospital Problems   Diagnosis Date Noted   AKI (acute kidney injury) ([place]) 10/30/2019    Priority: High   Hypokalemia 73/53/2992   Chronic systolic CHF (congestive heart failure) [facility])     Resolved Hospital Problems   Diagnosis Date Noted Date Resolved   Diarrhea 03/06/2020 11/20/2020    Priority: Medium    Discharge Instructions     Diet - low sodium heart healthy   Complete by: As directed    Increase activity slowly   Complete by: As directed       Allergies as of 11/21/2020       Reactions   Bactrim [sulfamethoxazole-trimethoprim] Rash  Medication List     STOP taking these medications    busPIRone 5 MG tablet Commonly known as: BUSPAR       TAKE these medications    carvedilol 3.125 MG tablet Commonly known as: Coreg Take 1 tablet (3.125 mg total) by mouth 2 (two) times daily.   dapagliflozin propanediol 10 MG Tabs tablet Commonly known as: Farxiga Take 1 tablet (10 mg total) by mouth daily before breakfast.    digoxin 0.125 MG tablet Commonly known as: LANOXIN Take 1 tablet (0.125 mg total) by mouth daily.   Entresto 97-103 MG Generic drug: sacubitril-valsartan Take 1 tablet by mouth 2 (two) times daily.   folic acid 1 MG tablet Commonly known as: FOLVITE Take 1 tablet (1 mg total) by mouth daily.   ivabradine 7.5 MG Tabs tablet Commonly known as: CORLANOR Take 1 tablet (7.5 mg total) by mouth 2 (two) times daily with a meal.   magnesium oxide 400 MG tablet Commonly known as: MAG-OX Take 400 mg by mouth daily.   nicotine 21 mg/24hr patch Commonly known as: NICODERM CQ - dosed in mg/24 hours Place 1 patch (21 mg total) onto the skin daily.   ondansetron 4 MG disintegrating tablet Commonly known as: Zofran ODT Take 1 tablet (4 mg total) by mouth every 8 (eight) hours as needed for nausea or vomiting.   pantoprazole 40 MG tablet Commonly known as: PROTONIX Take 1 tablet (40 mg total) by mouth daily.   spironolactone 25 MG tablet Commonly known as: ALDACTONE Take 1 tablet (25 mg total) by mouth daily.   sucralfate 1 g tablet Commonly known as: Carafate Take 1 tablet (1 g total) by mouth 4 (four) times daily -  with meals and at bedtime.   thiamine 100 MG tablet Take 1 tablet (100 mg total) by mouth daily.        Allergies  Allergen Reactions   Bactrim [Sulfamethoxazole-Trimethoprim] Rash    Consultations:   Discharge Exam: BP 126/89 (BP Location: Right Arm)   Pulse 74   Temp 98 F (36.7 C) (Oral)   Resp 16   Ht 5\' 10"  (1.778 m)   Wt 88.3 kg   SpO2 100%   BMI 27.93 kg/m  General appearance: alert and cooperative Head: Normocephalic, without obvious abnormality, atraumatic Eyes:  EOMI Lungs: clear to auscultation bilaterally Heart: regular rate and rhythm and S1, S2 normal Abdomen:  Soft, nontender, nondistended, bowel sounds present Extremities:  No edema Skin: mobility and turgor normal Neurologic: Grossly normal  The results of significant  diagnostics from this hospitalization (including imaging, microbiology, ancillary and laboratory) are listed below for reference.   Microbiology: Recent Results (from the past 240 hour(s))  Resp Panel by RT-PCR (Flu A&B, Covid) Nasopharyngeal Swab     Status: None   Collection Time: 11/19/20  3:20 PM   Specimen: Nasopharyngeal Swab; Nasopharyngeal(NP) swabs in vial transport medium  Result Value Ref Range Status   SARS Coronavirus 2 by RT PCR NEGATIVE NEGATIVE Final    Comment: (NOTE) SARS-CoV-2 target nucleic acids are NOT DETECTED.  The SARS-CoV-2 RNA is generally detectable in upper respiratory specimens during the acute phase of infection. The lowest concentration of SARS-CoV-2 viral copies this assay can detect is 138 copies/mL. A negative result does not preclude SARS-Cov-2 infection and should not be used as the sole basis for treatment or other patient management decisions. A negative result may occur with  improper specimen collection/handling, submission of specimen other than nasopharyngeal swab, presence of viral  mutation(s) within the areas targeted by this assay, and inadequate number of viral copies(<138 copies/mL). A negative result must be combined with clinical observations, patient history, and epidemiological information. The expected result is Negative.  Fact Sheet for Patients:  EntrepreneurPulse.com.au  Fact Sheet for Healthcare Providers:  IncredibleEmployment.be  This test is no t yet approved or cleared by the [place] FDA and  has been authorized for detection and/or diagnosis of SARS-CoV-2 by FDA under an Emergency Use Authorization (EUA). This EUA will remain  in effect (meaning this test can be used) for the duration of the COVID-19 declaration under Section 564(b)(1) of the Act, 21 U.S.C.section 360bbb-3(b)(1), unless the authorization is terminated  or revoked sooner.       Influenza A by PCR NEGATIVE  NEGATIVE Final   Influenza B by PCR NEGATIVE NEGATIVE Final    Comment: (NOTE) The Xpert Xpress SARS-CoV-2/FLU/RSV plus assay is intended as an aid in the diagnosis of influenza from Nasopharyngeal swab specimens and should not be used as a sole basis for treatment. Nasal washings and aspirates are unacceptable for Xpert Xpress SARS-CoV-2/FLU/RSV testing.  Fact Sheet for Patients: EntrepreneurPulse.com.au  Fact Sheet for Healthcare Providers: IncredibleEmployment.be  This test is not yet approved or cleared by the [place] FDA and has been authorized for detection and/or diagnosis of SARS-CoV-2 by FDA under an Emergency Use Authorization (EUA). This EUA will remain in effect (meaning this test can be used) for the duration of the COVID-19 declaration under Section 564(b)(1) of the Act, 21 U.S.C. section 360bbb-3(b)(1), unless the authorization is terminated or revoked.  Performed at [facility], [place] [address], [place], [place] [address]      Labs: BNP (last 3 results) Recent Labs    10/08/20 1654 11/17/20 1436 11/19/20 1230  BNP 202.1* 258.7* 56.4   Basic Metabolic Panel: Recent Labs  Lab 11/17/20 1435 11/19/20 1230 11/19/20 1617 11/20/20 0514 11/21/20 0458  NA 137 136  --  136 138  K 3.9 3.6  --  3.1* 3.6  CL 96* 93*  --  96* 101  CO2 26 28  --  27 28  GLUCOSE 153* 113*  --  82 86  BUN 20 27*  --  25* 19  CREATININE 1.37* 3.20* 2.92* 1.88* 1.25*  CALCIUM 9.8 9.4  --  8.5* 8.6*  MG  --   --  1.8  --  1.8   Liver Function Tests: Recent Labs  Lab 11/17/20 1435 11/19/20 1230 11/20/20 0514  AST 43* 38 28  ALT 51* 52* 40  ALKPHOS 50 52 42  BILITOT 1.4* 0.9 1.1  PROT 8.3* 8.5* 7.2  ALBUMIN 4.8 5.1* 4.2   Recent Labs  Lab 11/17/20 1435 11/19/20 1230  LIPASE 28 26   No results for input(s): AMMONIA in the last 168 hours. CBC: Recent Labs  Lab 11/17/20 1435 11/19/20 1230 11/19/20 1617  11/20/20 0514 11/21/20 0458  WBC 5.2 5.3 7.5 3.9* 3.0*  NEUTROABS 3.9 4.1  --   --  1.2*  HGB 12.4* 12.4* 12.8* 11.3* 11.0*  HCT 38.4* 37.5* 39.2 35.6* 34.6*  MCV 88.1 86.0 88.5 89.7 91.3  PLT 366 302 311 246 227   Cardiac Enzymes: No results for input(s): CKTOTAL, CKMB, CKMBINDEX, TROPONINI in the last 168 hours. BNP: Invalid input(s): POCBNP CBG: No results for input(s): GLUCAP in the last 168 hours. D-Dimer No results for input(s): DDIMER in the last 72 hours. Hgb A1c No results for input(s): HGBA1C in the last  72 hours. Lipid Profile No results for input(s): CHOL, HDL, LDLCALC, TRIG, CHOLHDL, LDLDIRECT in the last 72 hours. Thyroid function studies No results for input(s): TSH, T4TOTAL, T3FREE, THYROIDAB in the last 72 hours.  Invalid input(s): FREET3 Anemia work up No results for input(s): VITAMINB12, FOLATE, FERRITIN, TIBC, IRON, RETICCTPCT in the last 72 hours. Urinalysis    Component Value Date/Time   COLORURINE YELLOW 10/08/2020 2048   APPEARANCEUR CLEAR 10/08/2020 2048   LABSPEC >1.046 (H) 10/08/2020 2048   PHURINE 6.0 10/08/2020 2048   GLUCOSEU >=500 (A) 10/08/2020 2048   HGBUR NEGATIVE 10/08/2020 2048   BILIRUBINUR NEGATIVE 10/08/2020 2048   KETONESUR 80 (A) 10/08/2020 2048   PROTEINUR 100 (A) 10/08/2020 2048   NITRITE NEGATIVE 10/08/2020 2048   LEUKOCYTESUR NEGATIVE 10/08/2020 2048   Sepsis Labs Invalid input(s): PROCALCITONIN,  WBC,  LACTICIDVEN Microbiology Recent Results (from the past 240 hour(s))  Resp Panel by RT-PCR (Flu A&B, Covid) Nasopharyngeal Swab     Status: None   Collection Time: 11/19/20  3:20 PM   Specimen: Nasopharyngeal Swab; Nasopharyngeal(NP) swabs in vial transport medium  Result Value Ref Range Status   SARS Coronavirus 2 by RT PCR NEGATIVE NEGATIVE Final    Comment: (NOTE) SARS-CoV-2 target nucleic acids are NOT DETECTED.  The SARS-CoV-2 RNA is generally detectable in upper respiratory specimens during the acute phase of  infection. The lowest concentration of SARS-CoV-2 viral copies this assay can detect is 138 copies/mL. A negative result does not preclude SARS-Cov-2 infection and should not be used as the sole basis for treatment or other patient management decisions. A negative result may occur with  improper specimen collection/handling, submission of specimen other than nasopharyngeal swab, presence of viral mutation(s) within the areas targeted by this assay, and inadequate number of viral copies(<138 copies/mL). A negative result must be combined with clinical observations, patient history, and epidemiological information. The expected result is Negative.  Fact Sheet for Patients:  EntrepreneurPulse.com.au  Fact Sheet for Healthcare Providers:  IncredibleEmployment.be  This test is no t yet approved or cleared by the [place] FDA and  has been authorized for detection and/or diagnosis of SARS-CoV-2 by FDA under an Emergency Use Authorization (EUA). This EUA will remain  in effect (meaning this test can be used) for the duration of the COVID-19 declaration under Section 564(b)(1) of the Act, 21 U.S.C.section 360bbb-3(b)(1), unless the authorization is terminated  or revoked sooner.       Influenza A by PCR NEGATIVE NEGATIVE Final   Influenza B by PCR NEGATIVE NEGATIVE Final    Comment: (NOTE) The Xpert Xpress SARS-CoV-2/FLU/RSV plus assay is intended as an aid in the diagnosis of influenza from Nasopharyngeal swab specimens and should not be used as a sole basis for treatment. Nasal washings and aspirates are unacceptable for Xpert Xpress SARS-CoV-2/FLU/RSV testing.  Fact Sheet for Patients: EntrepreneurPulse.com.au  Fact Sheet for Healthcare Providers: IncredibleEmployment.be  This test is not yet approved or cleared by the [place] FDA and has been authorized for detection and/or diagnosis of SARS-CoV-2  by FDA under an Emergency Use Authorization (EUA). This EUA will remain in effect (meaning this test can be used) for the duration of the COVID-19 declaration under Section 564(b)(1) of the Act, 21 U.S.C. section 360bbb-3(b)(1), unless the authorization is terminated or revoked.  Performed at [facility], [place] [address], [place], [place] [address]     Procedures/Studies: DG Chest 2 View  Result Date: 11/17/2020 CLINICAL DATA:  Shortness of breath, chest pain,  emesis. EXAM: CHEST - 2 VIEW COMPARISON:  Chest x-ray 10/08/2020, CT chest 10/08/2020 FINDINGS: The heart size and mediastinal contours are within normal limits. No focal consolidation. No pulmonary edema. No pleural effusion. No pneumothorax. No acute osseous abnormality. Similar-appearing midthoracic compression fracture. IMPRESSION: No active cardiopulmonary disease. Electronically Signed   By: Iven Finn M.D.   On: 11/17/2020 15:22   US RENAL  Result Date: 11/19/2020 CLINICAL DATA:  Acute kidney injury. EXAM: RENAL / URINARY TRACT ULTRASOUND COMPLETE COMPARISON:  Abdomen and pelvis CT dated 10/08/2020 FINDINGS: Right Kidney: Renal measurements: 10.9 x 6.5 x 4.6 cm = volume: 168 mL. Echogenicity within normal limits. No mass or hydronephrosis visualized. Left Kidney: Renal measurements: 10.9 x 5.7 x 5.2 cm = volume: 170 mL. Echogenicity within normal limits. No mass or hydronephrosis visualized. Bladder: Appears normal for degree of bladder distention. Other: None. IMPRESSION: Normal examination. Electronically Signed   By: Claudie Revering M.D.   On: 11/19/2020 17:54   DG Chest Port 1 View  Result Date: 11/19/2020 CLINICAL DATA:  Shortness of breath. Cough for the past 2 days. History of asthma. EXAM: PORTABLE CHEST 1 VIEW COMPARISON:  11/17/2020 FINDINGS: Stable borderline enlarged cardiac silhouette with a poor inspiration noted. Clear lungs with normal vascularity. Unremarkable bones. IMPRESSION: Stable  borderline cardiomegaly.  No acute abnormality. Electronically Signed   By: Claudie Revering M.D.   On: 11/19/2020 12:07     Time coordinating discharge: Over 30 minutes    Dwyane Dee, MD  [facility] 11/21/2020, 2:37 PM

## 2020-11-22 ENCOUNTER — Telehealth: Payer: Self-pay | Admitting: *Deleted

## 2020-11-22 NOTE — Telephone Encounter (Signed)
Transition Care Management Unsuccessful Follow-up Telephone Call  Date of discharge and from where:  [facility] 11-21-2020  Attempts:  1st Attempt  Reason for unsuccessful TCM follow-up call:  voicemail not set up

## 2020-11-23 ENCOUNTER — Telehealth: Payer: Self-pay

## 2020-11-23 NOTE — Telephone Encounter (Signed)
Transition Care Management Follow-up Telephone Call Date of discharge and from where: [facility] 11/21/20 How have you been since you were released from the hospital? ok Any questions or concerns? No  Items Reviewed: Did the pt receive and understand the discharge instructions provided? Yes  Medications obtained and verified? Yes  Other? No  Any new allergies since your discharge? No  Dietary orders reviewed? Yes Do you have support at home? Yes   Home Care and Equipment/Supplies: Were home health services ordered? not applicable If so, what is the name of the agency?   Has the agency set up a time to come to the patient's home? not applicable Were any new equipment or medical supplies ordered?  No What is the name of the medical supply agency?  Were you able to get the supplies/equipment? not applicable Do you have any questions related to the use of the equipment or supplies? No  Functional Questionnaire: (I = Independent and D = Dependent) ADLs: I  Bathing/Dressing- I  Meal Prep- I  Eating- I  Maintaining continence- I  Transferring/Ambulation- I  Managing Meds- I  Follow up appointments reviewed:  PCP Hospital f/u appt confirmed? No   Specialist Hospital f/u appt confirmed? No  Encouraged patient to follow up with Cardiology Are transportation arrangements needed? No  If their condition worsens, is the pt aware to call PCP or go to the Emergency Dept.? Yes Was the patient provided with contact information for the PCP's office or ED? Yes Was to pt encouraged to call back with questions or concerns? Yes

## 2020-11-24 ENCOUNTER — Telehealth (HOSPITAL_COMMUNITY): Payer: Self-pay | Admitting: *Deleted

## 2020-11-24 NOTE — Telephone Encounter (Signed)
Pt left vm requesting call about disability paperwork. Paperwork is due 7/21.   Routed to [employer]

## 2020-11-28 ENCOUNTER — Encounter (HOSPITAL_COMMUNITY): Payer: BC Managed Care – PPO

## 2020-11-30 ENCOUNTER — Ambulatory Visit (HOSPITAL_COMMUNITY)
Admission: RE | Admit: 2020-11-30 | Discharge: 2020-11-30 | Disposition: A | Discharge status: Home / Self Care | Payer: BC Managed Care – PPO | Source: Ambulatory Visit | Attending: Family Medicine | Admitting: Family Medicine

## 2020-11-30 ENCOUNTER — Other Ambulatory Visit: Payer: Self-pay

## 2020-11-30 ENCOUNTER — Encounter (HOSPITAL_COMMUNITY): Payer: Self-pay

## 2020-11-30 VITALS — BP 128/88 | HR 91 | Wt 190.6 lb

## 2020-11-30 DIAGNOSIS — I2699 Other pulmonary embolism without acute cor pulmonale: Secondary | ICD-10-CM | POA: Diagnosis not present

## 2020-11-30 DIAGNOSIS — Z9114 Patient's other noncompliance with medication regimen: Secondary | ICD-10-CM | POA: Diagnosis not present

## 2020-11-30 DIAGNOSIS — I5022 Chronic systolic (congestive) heart failure: Secondary | ICD-10-CM | POA: Insufficient documentation

## 2020-11-30 DIAGNOSIS — Z8249 Family history of ischemic heart disease and other diseases of the circulatory system: Secondary | ICD-10-CM | POA: Diagnosis not present

## 2020-11-30 DIAGNOSIS — Z8719 Personal history of other diseases of the digestive system: Secondary | ICD-10-CM | POA: Insufficient documentation

## 2020-11-30 DIAGNOSIS — F1410 Cocaine abuse, uncomplicated: Secondary | ICD-10-CM

## 2020-11-30 DIAGNOSIS — R55 Syncope and collapse: Secondary | ICD-10-CM

## 2020-11-30 DIAGNOSIS — Z86711 Personal history of pulmonary embolism: Secondary | ICD-10-CM | POA: Insufficient documentation

## 2020-11-30 DIAGNOSIS — I5082 Biventricular heart failure: Secondary | ICD-10-CM | POA: Diagnosis not present

## 2020-11-30 DIAGNOSIS — F1010 Alcohol abuse, uncomplicated: Secondary | ICD-10-CM | POA: Diagnosis not present

## 2020-11-30 DIAGNOSIS — I428 Other cardiomyopathies: Secondary | ICD-10-CM | POA: Insufficient documentation

## 2020-11-30 DIAGNOSIS — Z720 Tobacco use: Secondary | ICD-10-CM | POA: Diagnosis not present

## 2020-11-30 DIAGNOSIS — Z79899 Other long term (current) drug therapy: Secondary | ICD-10-CM | POA: Insufficient documentation

## 2020-11-30 DIAGNOSIS — F1411 Cocaine abuse, in remission: Secondary | ICD-10-CM | POA: Diagnosis not present

## 2020-11-30 DIAGNOSIS — F17210 Nicotine dependence, cigarettes, uncomplicated: Secondary | ICD-10-CM | POA: Insufficient documentation

## 2020-11-30 LAB — BASIC METABOLIC PANEL
Anion gap: 12 (ref 5–15)
BUN: 19 mg/dL (ref 6–20)
CO2: 20 mmol/L — ABNORMAL LOW (ref 22–32)
Calcium: 9.5 mg/dL (ref 8.9–10.3)
Chloride: 104 mmol/L (ref 98–111)
Creatinine, Ser: 1.03 mg/dL (ref 0.61–1.24)
GFR, Estimated: >60 mL/min (ref >60)
Glucose, Bld: 93 mg/dL (ref 70–99)
Potassium: 4.3 mmol/L (ref 3.5–5.1)
Sodium: 136 mmol/L (ref 135–145)

## 2020-11-30 LAB — DIGOXIN LEVEL: Digoxin Level: <0.2 ng/mL — ABNORMAL LOW (ref 0.8–2.0)

## 2020-11-30 IMAGING — CR DG CHEST 2V
2 series · 2 of 2 positions shown · non-contrast
Comparison: November 20, 2019

CLINICAL DATA: Chest pain.

EXAM:
CHEST - 2 VIEW

[chest pa]
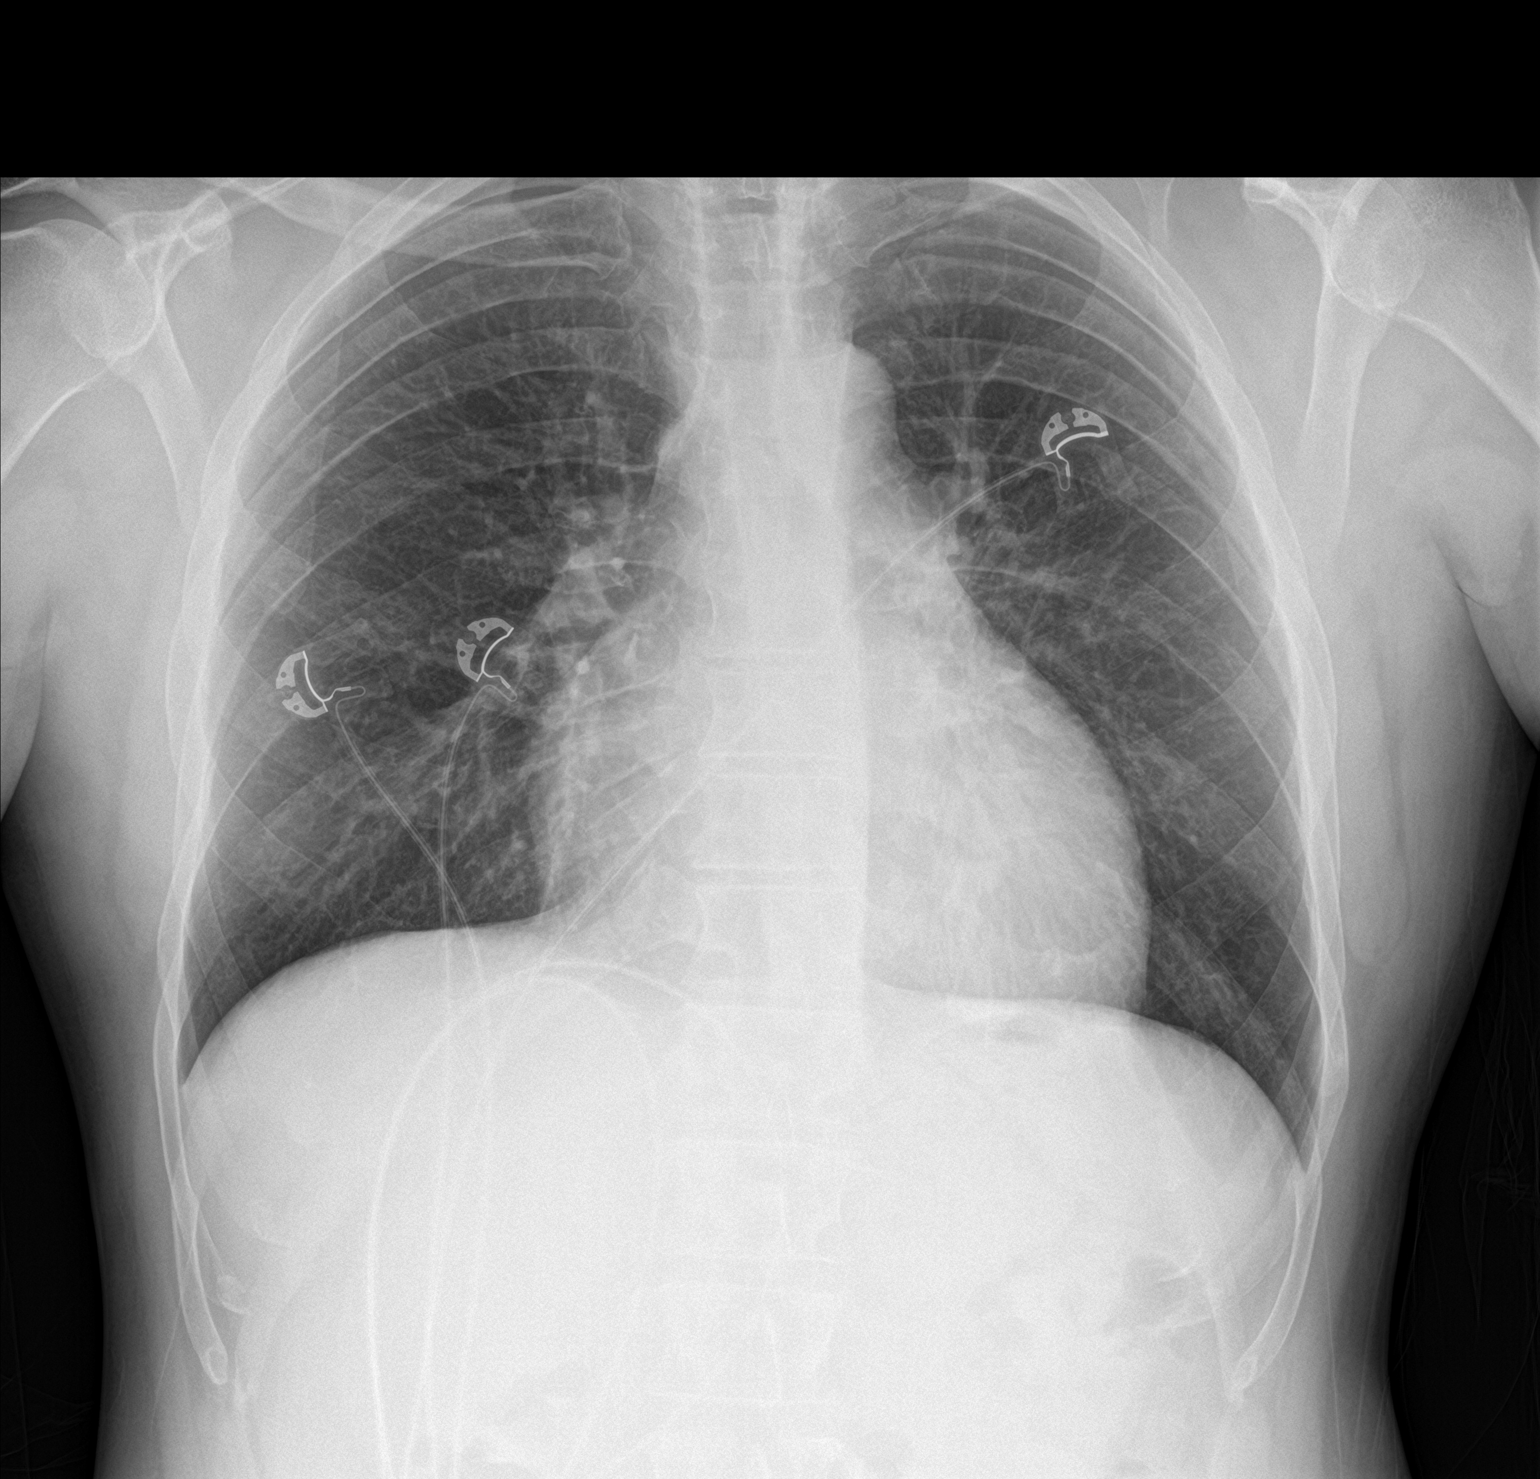

[chest lat]
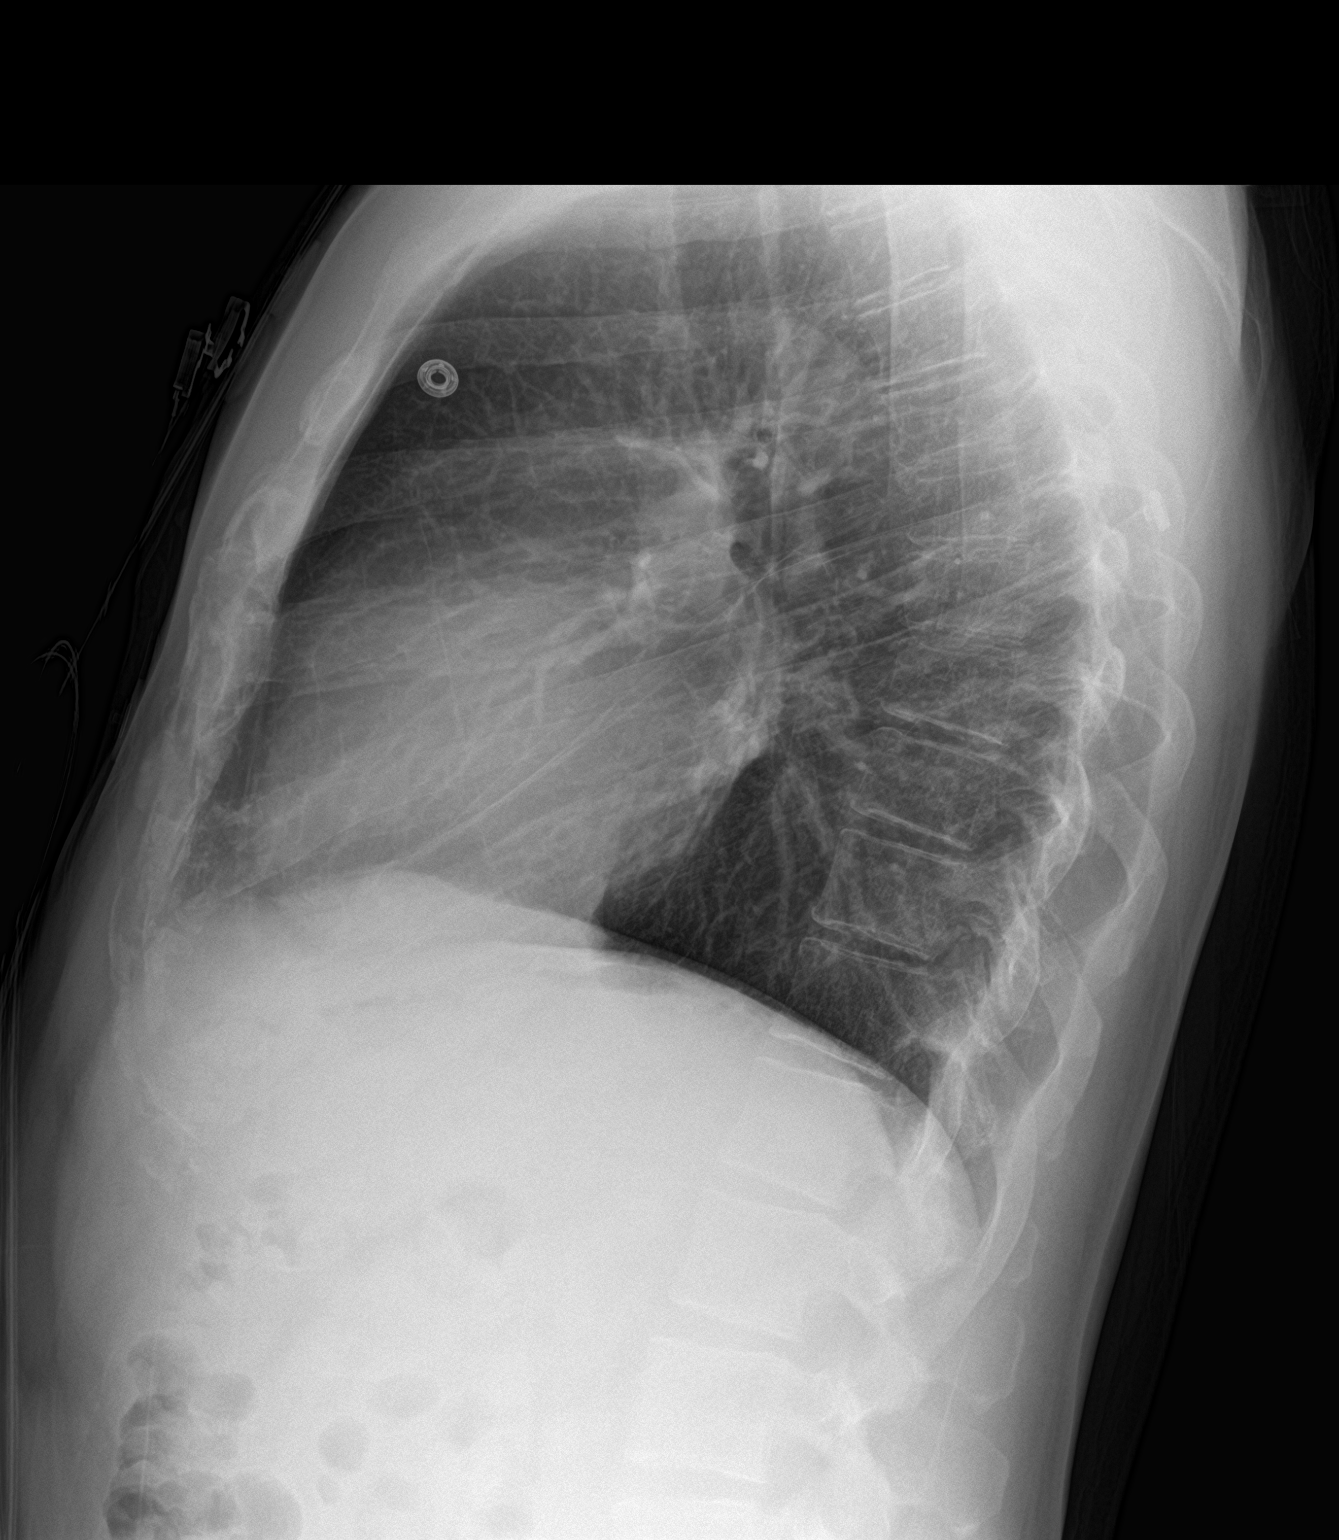

[2 of 2 positions shown; findings below may reference images not displayed]

FINDINGS: The cardiac silhouette is mildly enlarged and stable. The hila and
mediastinum are unremarkable. No pneumothorax. No nodules or masses.
No focal infiltrates.
IMPRESSION: Stable mild cardiomegaly.  No acute abnormalities.

## 2020-11-30 NOTE — Patient Instructions (Signed)
It was great to see you today! No medication changes are needed at this time.   Labs today We will only contact you if something comes back abnormal or we need to make some changes. Otherwise no news is good news!   Keep follow up as scheduled  Do the following things EVERYDAY: Weigh yourself in the morning before breakfast. Write it down and keep it in a log. Take your medicines as prescribed Eat low salt foods--Limit salt (sodium) to 2000 mg per day.  Stay as active as you can everyday Limit all fluids for the day to less than 2 liters   milAt the [facility], you and your health needs are our priority. As part of our continuing mission to provide you with exceptional heart care, we have created designated Provider Care Teams. These Care Teams include your primary Cardiologist (physician) and Advanced Practice Providers (APPs- Physician Assistants and Nurse Practitioners) who all work together to provide you with the care you need, when you need it.   You may see any of the following providers on your designated Care Team at your next follow up: Dr Daniel Bensimhon Dr Dalton McLean Dr Brandon Winfrey Amy Clegg, NP Brittainy Simmons, PA Jessica Milford,NP Lauren Kemp, PharmD   Please be sure to bring in all your medications bottles to every appointment.   

## 2020-11-30 NOTE — Progress Notes (Addendum)
ADVANCED HF CLINIC NOTE  PCP: [name] NP Primary HF Cardiologist: Dr Haroldine Laws    Reason for F/u: Heart Failure    HPI: Robert [name] is a 40 y/o male with h/o heavy ETOH and cocaine use, PE,  biventricular systolic heart failure and tobacco abuse.   Admitted with acute onset biventricular HF in 5/21. EF 15%. RV down. He was drinking at least 2 beers every night and about 12-24 over the weekend. Also drinking 1/5 of liqour every other day. Cath showed normal coronaries. Elevated filling pressures with normal output. Placed on milrinone and diuresed with IV lasix. As he improved milrinone was weaned off . Transitioned to oral lasix. 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 CT showed small LLL PE. Placed on xarelto. Started on HF meds.  Discharge weight 178 pounds.   Readmitted 6/21 for mild acute blood loss related anemia. EGD showed no active bleeding but evidence of some gastritis.  He was switched to oral PPI twice daily, H&H remained stable and he did not require any transfusions.  He was instructed to continue Xarelto.   Continues to have very frequent ED visits. Admitted 10/25-10/28/21 for ETOH withdrawal symptoms and AKI. Admitted 03/23/2020 with AMS in the setting ETOH abuse. Hypotensive, AKI, and with lactic acidosis. Entresto, spiro, and lasix. All restarted prior to discharge. Returned to the ED on 03/26/20 with nausea/vomiting.   Echo 10/21 EF 30-35% mild RV HK.   Admitted 07/10/20-07/12/20 with seizure in the setting of ETOH withdrawal.   Admitted 07/29/20-07/31/20 with n/v abdominal pain. Had AKI. GI consulted and recommended EGD. He felt better and refused EGD. Plan to continue PPI + carafate. Xarelto and zoloft stopped. Discharged earlier today from the hospital.   Evaluated in the ED 08/19/20 with AKI, 09/01/20 with ETOH withdrawl, and 09/11/20 with atypical chest pain.   Admitted 10/08/20 for CP, SOB, N/V. Blood ethanol level 180. CXR concerning for PNA, possible infected  pneumatocele. Treated with IV abx. Hospitalization c/b AKI and acute ETOH withdrawal, requiring ICU care. Seroquel and sertraline stopped at discharge.  Presented to [facility] 7/8 with diarrhea, N/V. Last ETOH was 2-3 days prior. He was admitted and found to have AKI, likely due to volume depletion from diarrhea and vomiting. SCr peaked at 3.2. Entresto and spiro held, restarted at discharge. SCr down to 1.25 at discharge after gentle IV hydration. Weight 194 lbs.  Today he returns for HF follow up. Says he is feeling fine. No SOB with activity. Denies increasing SOB, CP, dizziness, edema, or PND/Orthopnea. Appetite ok. No fever or chills. Weight at home 190 pounds. . Missing a dose of meds 1-2x/week. Sill drinking ETOH daily. Planning on going to [employer] on Sunday.  Cardiac studies. Echo (5/21): EF <20%  Prominent trabeculation. Grade III DD  R/LHC (5/21): showed normal coronaries, elevated filling pressures with normal output.  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 No tamponade physiology.  Echo (10/21): EF 30-35% RV normal. No effusion.  Echo (2/22): EF 30%   ROS: All systems negative except as listed in HPI, PMH and Problem List.  SH:  Social History   Socioeconomic History   Marital status: Single    Spouse name: Not on file   Number of children: Not on file   Years of education: Not on file   Highest education level: Not on file  Occupational History   Occupation: Ecologist  Tobacco Use  Smoking status: Every Day    Packs/day: 0.25    Years: 24.00    Pack years: 6.00    Types: Cigarettes   Smokeless tobacco: Never  Vaping Use   Vaping Use: Former  Substance and Sexual Activity   Alcohol use: Yes    Alcohol/week: 8.0 standard drinks    Types: 8 Shots of liquor per week    Comment: heavy use since age 78   Drug use: Yes    Frequency: 1.0 times per week     Types: Marijuana   Sexual activity: Yes  Other Topics Concern   Not on file  Social History Narrative   Not on file   Social Determinants of Health   Financial Resource Strain: Not on file  Food Insecurity: Not on file  Transportation Needs: Not on file  Physical Activity: Not on file  Stress: Not on file  Social Connections: Not on file  Intimate Partner Violence: Not on file   FH:  Family History  Problem Relation Age of Onset   Heart failure Father    Diabetes Father    Kidney disease Father    Heart disease Father 59   Heart disease Maternal Grandfather 32   Heart disease Paternal Grandfather 25   Past Medical History:  Diagnosis Date   Alcohol use    Alcohol withdrawal ([place]) 03/06/2020   Asthma    CHF (congestive heart failure) (HCC)    Current Outpatient Medications  Medication Sig Dispense Refill   carvedilol (COREG) 3.125 MG tablet Take 1 tablet (3.125 mg total) by mouth 2 (two) times daily. 60 tablet 11   dapagliflozin propanediol (FARXIGA) 10 MG TABS tablet Take 1 tablet (10 mg total) by mouth daily before breakfast. 30 tablet 11   digoxin (LANOXIN) 0.125 MG tablet Take 1 tablet (0.125 mg total) by mouth daily. 30 tablet 5   folic acid (FOLVITE) 1 MG tablet Take 1 tablet (1 mg total) by mouth daily. 30 tablet 3   ivabradine (CORLANOR) 7.5 MG TABS tablet Take 1 tablet (7.5 mg total) by mouth 2 (two) times daily with a meal. 60 tablet 11   magnesium oxide (MAG-OX) 400 MG tablet Take 400 mg by mouth daily.     ondansetron (ZOFRAN ODT) 4 MG disintegrating tablet Take 1 tablet (4 mg total) by mouth every 8 (eight) hours as needed for nausea or vomiting. 20 tablet 0   pantoprazole (PROTONIX) 40 MG tablet Take 1 tablet (40 mg total) by mouth daily. 90 tablet 0   sacubitril-valsartan (ENTRESTO) 97-103 MG Take 1 tablet by mouth 2 (two) times daily. 60 tablet 3   spironolactone (ALDACTONE) 25 MG tablet Take 1 tablet (25 mg total) by mouth daily. 90 tablet 3   sucralfate  (CARAFATE) 1 g tablet Take 1 tablet (1 g total) by mouth 4 (four) times daily -  with meals and at bedtime. 120 tablet 0   thiamine 100 MG tablet Take 1 tablet (100 mg total) by mouth daily. 30 tablet 0   No current facility-administered medications for this encounter.   Wt Readings from Last 3 Encounters:  11/30/20 86.5 kg (190 lb 9.6 oz)  11/21/20 88.3 kg (194 lb 10.7 oz)  10/30/20 89.9 kg (198 lb 3.2 oz)   BP 128/88   Pulse 91   Wt 86.5 kg (190 lb 9.6 oz)   SpO2 99%   BMI 27.35 kg/m   PHYSICAL EXAM: General:  NAD. No resp difficulty HEENT: Normal Neck: Supple. No JVD. Carotids  2+ bilat; no bruits. No lymphadenopathy or thryomegaly appreciated. Cor: PMI nondisplaced. Regular rate & rhythm. No rubs, gallops or murmurs. Lungs: Clear Abdomen: Soft, nontender, nondistended. No hepatosplenomegaly. No bruits or masses. Good bowel sounds. Extremities: No cyanosis, clubbing, rash, edema Neuro: Alert & oriented x 3, cranial nerves grossly intact. Moves all 4 extremities w/o difficulty, tremulous hands today.  Affect flat.  ECG: SR LVH lateral TWI (personally reviewed)  ASSESSMENT & PLAN: 1. Chronic Systolic Heart Failure, NICM  - Echo (5/21): EF 15% RV moderately reduced. Suspect ETOH and cocaine playing a role.  - LHC (5/21): normal cors. 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  - Echo (10/21): EF 30-35% RV normal. No effusion.  - Echo (2/22): EF 30%  - Stable NYHA II- early III. Volume good today. - Continue Entresto 97-103 mg bid.  - Continue Coreg 3.125 mg bid.  - Continue spiro 25 mg daily. - Continue digoxin 0.125 mg daily. - Continue Farxiga 10 mg daily. - Referred for genetic testing for LVNC.  - Not a candidate for ICD or advanced therapies w/ polysubstance. - Compliance with medications remain an issue. Stressed he needs to take his medications every day. - BMET and dig level today.  2. H/o  Syncope - 07/10/20. - Denies related to ETOH intoxication. - No driving x 6 months per [place] law. He is aware.    3. Pulmonary Emboli - CTA w/ small LLL PE 5/21. - LE Venous dopplers negative for DVT.  - Given small PE. Risk of ongoing AC likely outweighs benefit. - Off Xarelto.   4. ETOH Abuse - Heavy drinker for many years since the age of 75.  - 01/2020 had 28 day detox [facility] but started drinking again.  - Considering another attempt at [employer] this weekend. - Encouraged him to do so.  5. H/O Cocaine Abuse - No recent use.  - UDS 10/08/20 negative.  6. Tobacco Abuse - Smoking 1/2 ppd  7. H/o GIB/ Gastritis w/ ABLA - h/o heavy ETOH abuse + chronic anticoagulation therapy w/ Xarelto - Off xarelto.  - Refused EGD 07/28/20.  Difficult to manage with ongoing addiction issues. Hard to know what he is taking and how often. He has been discharged from the [facility] program due to on-going non compliance.  Follow up with Dr. Haroldine Laws 2-3 months.  [place], FNP-BC  11/30/20 12:02 PM

## 2020-12-08 ENCOUNTER — Telehealth (HOSPITAL_COMMUNITY): Payer: Self-pay | Admitting: Cardiology

## 2020-12-08 NOTE — Telephone Encounter (Signed)
Disability paperwork completed and faxed back to [facility]  Fax # [phone] Fax confirmation received

## 2020-12-19 ENCOUNTER — Encounter: Payer: Self-pay | Admitting: Nurse Practitioner

## 2020-12-20 ENCOUNTER — Encounter: Payer: Self-pay | Admitting: Nurse Practitioner

## 2021-01-11 DIAGNOSIS — F1020 Alcohol dependence, uncomplicated: Secondary | ICD-10-CM | POA: Diagnosis not present

## 2021-01-12 ENCOUNTER — Encounter: Payer: Self-pay | Admitting: Nurse Practitioner

## 2021-01-12 ENCOUNTER — Ambulatory Visit (INDEPENDENT_AMBULATORY_CARE_PROVIDER_SITE_OTHER): Payer: BC Managed Care – PPO | Admitting: Nurse Practitioner

## 2021-01-12 ENCOUNTER — Other Ambulatory Visit (INDEPENDENT_AMBULATORY_CARE_PROVIDER_SITE_OTHER): Payer: BC Managed Care – PPO

## 2021-01-12 VITALS — BP 144/90 | HR 72 | Ht 68.5 in | Wt 205.2 lb

## 2021-01-12 DIAGNOSIS — D649 Anemia, unspecified: Secondary | ICD-10-CM

## 2021-01-12 DIAGNOSIS — F10229 Alcohol dependence with intoxication, unspecified: Secondary | ICD-10-CM

## 2021-01-12 DIAGNOSIS — K625 Hemorrhage of anus and rectum: Secondary | ICD-10-CM | POA: Diagnosis not present

## 2021-01-12 LAB — CBC
HCT: 36 % — ABNORMAL LOW (ref 39.0–52.0)
Hemoglobin: 12 g/dL — ABNORMAL LOW (ref 13.0–17.0)
MCHC: 33.4 g/dL (ref 30.0–36.0)
MCV: 86.3 fl (ref 78.0–100.0)
Platelets: 266 10*3/uL (ref 150.0–400.0)
RBC: 4.18 Mil/uL — ABNORMAL LOW (ref 4.22–5.81)
RDW: 15 % (ref 11.5–15.5)
WBC: 5.2 10*3/uL (ref 4.0–10.5)

## 2021-01-12 LAB — COMPREHENSIVE METABOLIC PANEL
ALT: 21 U/L (ref 0–53)
AST: 17 U/L (ref 0–37)
Albumin: 4.3 g/dL (ref 3.5–5.2)
Alkaline Phosphatase: 58 U/L (ref 39–117)
BUN: 21 mg/dL (ref 6–23)
CO2: 25 mEq/L (ref 19–32)
Calcium: 9.3 mg/dL (ref 8.4–10.5)
Chloride: 103 mEq/L (ref 96–112)
Creatinine, Ser: 1.21 mg/dL (ref 0.40–1.50)
GFR: 75.52 mL/min (ref >60.00)
Glucose, Bld: 85 mg/dL (ref 70–99)
Potassium: 4.4 mEq/L (ref 3.5–5.1)
Sodium: 136 mEq/L (ref 135–145)
Total Bilirubin: 0.3 mg/dL (ref 0.2–1.2)
Total Protein: 7.4 g/dL (ref 6.0–8.3)

## 2021-01-12 MED ORDER — HYDROCORTISONE ACETATE 25 MG RE SUPP: 25.0000 mg | Freq: Every evening | RECTAL | 0 refills | Status: AC | PRN

## 2021-01-12 NOTE — Patient Instructions (Addendum)
If you are age 39 or younger, your body mass index should be between 19-25. Your Body mass index is 39 kg/m kg/m. If this is out of the aformentioned range listed, please consider follow up with your Primary Care Provider.   The [place] [facility] providers would like to encourage you to use [facility] to communicate with providers for non-urgent requests or questions.  Due to long hold times on the telephone, sending your provider a message by [facility] may be faster and more efficient way to get a response. Please allow 48 business hours for a response.  Please remember that this is for non-urgent requests/questions.  LABS:  Lab work has been ordered for you today. Our lab is located in the basement. Press "B" on the elevator. The lab is located at the first door on the left as you exit the elevator.  HEALTHCARE LAWS AND MY CHART RESULTS: Due to recent changes in healthcare laws, you may see the results of your imaging and laboratory studies on MyChart before your provider has had a chance to review them.   We understand that in some cases there may be results that are confusing or concerning to you. Not all laboratory results come back in the same time frame and the provider may be waiting for multiple results in order to interpret others.  Please give [place] 48 hours in order for your provider to thoroughly review all the results before contacting the office for clarification of your results.   MEDICATION: We have sent the following medication to your pharmacy for you to pick up at your convenience: hydrocortisone (ANUSOL-HC) 25 MG suppository. Place 1 suppository (25 mg total) rectally at bedtime as needed for up to 5 days for hemorrhoids or anal itching  RECOMMENDATIONS: Miralax- Dissolve one capful in 8 ounces of water and drink before bed as needed. Benefiber- 1 tablespoon daily. Follow up in 6-8 weeks or sooner if needed.  It was great seeing you today! Thank you for entrusting me with your care and  choosing [facility].  Noralyn Pick, CRNP

## 2021-01-12 NOTE — Progress Notes (Signed)
01/12/2021 Robert [name] [id number] August 14, 1980   CHIEF COMPLAINT: Rectal bleeding   HISTORY OF PRESENT ILLNESS:  Robert [name] is a 40 year old male with a past medical history of asthma, biventricular CHF, nonischemic cardiomyopathy, prolonged QTc interval, PE 09/2019, polysubstance abuse (alcohol, cocaine, marijuana and tobacco), severe alcohol withdrawal/delirium tremens which required numerous hospital admissions. He was admitted to the hospital ICU 5/29 - 10/13/2020 with severe sepsis secondary to aspiration pneumonia treated with IV antibiotics with severe alcohol withdrawal/delirium tremens.  He is currently enrolled in rehab at [facility] for the past 4 weeks until early Oct. 2022.   He presents to our office today for further evaluation regarding rectal bleeding. He is a challenging historian. He describes seeing a small to moderate amount of bright red blood on the toilet tissue and sometimes on the stool which occurs if he strains to pass a BM and started a few weeks ago. He denies ever having a colonoscopy. He complains of having a bubbling sensation in his stomach at times, no significant abdominal pain. He remains abstinent from alcohol for 4 weeks. He previously drank 1 pint of Vodka most days.  He remains drug free for 1 1/2 to 2 years.   In review of his Epic records, he was also admitted to the hospital 11/19/2020 with right sided chest pain, nausea and vomiting. His creatinine level was mildly elevated, he received IV fluids and antiemetics and his vomiting abated without further diarrhea therefore stool studies were not done. He denies having any further chest pain, N/V or diarrhea.   He was admitted to the hospital 3/18 - 08/01/2020 with N/V, hematemesis with black stools. Hg level 14.8 -> 12.2. He was evaluated by our GI service during this hospitalization seen by Azucena Freed PA-C and Dr. Tarri Glenn. It was thought he likely had alcohol related gastritis. He received IV PPI  and an EGD was recommended but he did not wish to pursue any endoscopic evaluation. He was discharged home on PPI bd x 4 weeks then QD.   He was admitted to the hospital 6/19 - 11/02/2019 with an UGI bleed while on Xarelto for a PE. Hg 13.9. He underwent an EGD by Dr. Penelope Coop which was unrevealing, see results below.   EGD 11/01/2019 Dr. Penelope Coop: - Normal esophagus. - Erythematous mucosa in the stomach. - Erythematous duodenopathy. - No specimens collected.  Echo 07/10/2020: 1. 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 LVEF is severely depressed. Compared to echo from October 2021, no significant change. Global longitudinal strain is -10.9%. Left ventricular ejection fraction, by estimation, is 30%%. The left ventricle has severely decreased function. The left ventricle demonstrates global hypokinesis. The left ventricular internal cavity size was severely dilated. Left ventricular diastolic parameters are consistent with Grade I diastolic dysfunction (impaired relaxation). 2. Right ventricular systolic function is normal. The right ventricular size is normal. 3. The mitral valve is normal in structure. No evidence of mitral valve regurgitation. 4. The aortic valve is abnormal. Aortic valve regurgitation is not visualized. Mild aortic valve sclerosis is present, with no evidence of aortic valve stenosis.  Past Medical History:  Diagnosis Date   Alcohol use    Alcohol withdrawal ([place]) 03/06/2020   Asthma    CHF (congestive heart failure) ([place])    Past Surgical History:  Procedure Laterality Date   ESOPHAGOGASTRODUODENOSCOPY (EGD) WITH PROPOFOL N/A 11/01/2019   Procedure: ESOPHAGOGASTRODUODENOSCOPY (EGD) WITH PROPOFOL;  Surgeon: [name], MD;  Location: [facility];  Service: Endoscopy;  Laterality: N/A;   RIGHT/LEFT HEART CATH AND CORONARY ANGIOGRAPHY N/A 10/06/2019   Procedure: RIGHT/LEFT HEART CATH AND CORONARY ANGIOGRAPHY;  Surgeon: [name], MD;  Location: [facility];  Service: Cardiovascular;  Laterality: N/A;   WRIST SURGERY     Social History: He smokes cigarettes < 1/2 ppd. No alcohol x 4 weeks. Drug free for 1 1/2 to 2 years. See HPI.  Family History: family history includes Diabetes in his father; Heart disease (age of onset: 32) in his father; Heart disease (age of onset: 54) in his maternal grandfather and paternal grandfather; Heart failure in his father; Kidney disease in his father.   Allergies  Allergen Reactions   Bactrim [Sulfamethoxazole-Trimethoprim] Rash      Outpatient Encounter Medications as of 01/12/2021  Medication Sig   carvedilol (COREG) 3.125 MG tablet Take 1 tablet (3.125 mg total) by mouth 2 (two) times daily.   dapagliflozin propanediol (FARXIGA) 10 MG TABS tablet Take 1 tablet (10 mg total) by mouth daily before breakfast.   digoxin (LANOXIN) 0.125 MG tablet Take 1 tablet (0.125 mg total) by mouth daily.   folic acid (FOLVITE) 1 MG tablet Take 1 tablet (1 mg total) by mouth daily.   ivabradine (CORLANOR) 7.5 MG TABS tablet Take 1 tablet (7.5 mg total) by mouth 2 (two) times daily with a meal.   magnesium oxide (MAG-OX) 400 MG tablet Take 400 mg by mouth daily.   MELATONIN PO Take 1 tablet by mouth at bedtime as needed.   ondansetron (ZOFRAN ODT) 4 MG disintegrating tablet Take 1 tablet (4 mg total) by mouth every 8 (eight) hours as needed for nausea or vomiting.   pantoprazole (PROTONIX) 40 MG tablet Take 1 tablet (40 mg total) by mouth daily.   QUEtiapine (SEROQUEL) 100 MG tablet Take 1 tablet by mouth at bedtime.   sacubitril-valsartan (ENTRESTO) 97-103 MG Take 1 tablet by mouth 2 (two) times daily.   spironolactone (ALDACTONE) 25 MG tablet Take 1 tablet (25 mg total) by mouth daily.   sucralfate (CARAFATE) 1 g tablet Take 1 tablet (1 g total) by mouth 4 (four) times daily -  with meals and at bedtime.   thiamine 100 MG tablet Take 1 tablet (100 mg total) by mouth daily.   No  facility-administered encounter medications on file as of 01/12/2021.    REVIEW OF SYSTEMS: Gen: Denies fever, sweats or chills. No weight loss.  CV: Denies chest pain, palpitations or edema. Resp: Denies cough, shortness of breath of hemoptysis.  GI: See HPI.  GU : Denies urinary burning, blood in urine, increased urinary frequency or incontinence. MS: Denies joint pain, muscles aches or weakness. Derm: Denies rash, itchiness, skin lesions or unhealing ulcers. Psych: + Depression.  Heme: Denies bruising, bleeding. Neuro:  Denies headaches, dizziness or paresthesias. Endo:  Denies any problems with DM, thyroid or adrenal function.  PHYSICAL EXAM: Ht 5' 8.5" (1.74 m) Comment: height measured without shoes  Wt 205 lb 4 oz (93.1 kg)   BMI 30.75 kg/m  General: 40 year old male in no acute distress. Head: Normocephalic and atraumatic. Eyes:  Sclerae non-icteric, conjunctive pink. Ears: Normal auditory acuity. Mouth: Dentition intact. No ulcers or lesions.  Neck: Supple, no lymphadenopathy or thyromegaly.  Lungs: Clear bilaterally to auscultation without wheezes, crackles or rhonchi. Heart: Regular rate and rhythm. No murmur, rub or gallop appreciated.  Abdomen: Soft, nontender, non distended. No masses. No hepatosplenomegaly. Normoactive bowel sounds x 4 quadrants.  Rectal:  No external hemorrhoids. Incomplete rectal exam as patient had difficulty relaxing, barely had tip of finger in anal opening. No obvious fissure. Melissa CMA present during exam.  Musculoskeletal: Symmetrical with no gross deformities. Skin: Warm and dry. No rash or lesions on visible extremities. Extremities: No edema. Neurological: Alert oriented x 4, no focal deficits.  Psychological:  Alert and cooperative. Normal mood and affect.  ASSESSMENT AND PLAN:  59) 40 year old male with rectal bleeding associated with straining to pass a BM,  suspect internal hemorrhoids  -CBC, CMP -Benefiber 1 tbsp daily -Miralax Q  HS as needed  -Anusol HC 25mg  suppository 1 PR Q HS x 5 nights  -I discussed an eventual diagnostic colonoscopy to rule out colorectal ulcer/polyp or malignant process, to discuss further at the time of his recommended follow up office visit in 6 to 8 weeks and when he has completed rehab   2) Alcohol use disorder. Abstinent from alcohol x 4 weeks. Drug free for at least 1 1/2 years. Currently enrolled in rehab at [facility]. -Encouraged continued alcohol and drug abstinence   3) CHF/cardiomyopathy. LV EV 30%. Likely due to past cocaine/drug use.   4) History of UGI bleed, alcohol related gastritis. EGD 10/2019 showed mild erythema in the stomach and duodenum.  -Continue Pantoprazole 40mg  QD  5) PE 09/2019, no longer on anticoagulation     CC:  Nche, Charlene Brooke, NP

## 2021-01-13 LAB — IRON,TIBC AND FERRITIN PANEL
%SAT: 13 % (calc) — ABNORMAL LOW (ref 20–48)
Ferritin: 23 ng/mL — ABNORMAL LOW (ref 38–380)
Iron: 54 ug/dL (ref 50–180)
TIBC: 426 mcg/dL (calc) — ABNORMAL HIGH (ref 250–425)

## 2021-01-15 DIAGNOSIS — F1020 Alcohol dependence, uncomplicated: Secondary | ICD-10-CM | POA: Diagnosis not present

## 2021-01-16 DIAGNOSIS — F1020 Alcohol dependence, uncomplicated: Secondary | ICD-10-CM | POA: Diagnosis not present

## 2021-01-16 NOTE — Progress Notes (Signed)
Reviewed and agree with management plans. Goal will be colonoscopy when he completes rehab.  Danice Dippolito L. Tarri Glenn, MD, MPH

## 2021-01-18 DIAGNOSIS — F1020 Alcohol dependence, uncomplicated: Secondary | ICD-10-CM | POA: Diagnosis not present

## 2021-01-19 DIAGNOSIS — Z20822 Contact with and (suspected) exposure to covid-19: Secondary | ICD-10-CM | POA: Diagnosis not present

## 2021-01-22 DIAGNOSIS — F1020 Alcohol dependence, uncomplicated: Secondary | ICD-10-CM | POA: Diagnosis not present

## 2021-01-23 DIAGNOSIS — F1020 Alcohol dependence, uncomplicated: Secondary | ICD-10-CM | POA: Diagnosis not present

## 2021-01-25 ENCOUNTER — Telehealth (HOSPITAL_COMMUNITY): Payer: Self-pay | Admitting: *Deleted

## 2021-01-25 DIAGNOSIS — F1020 Alcohol dependence, uncomplicated: Secondary | ICD-10-CM | POA: Diagnosis not present

## 2021-01-25 NOTE — Telephone Encounter (Signed)
Dr.Mike Pecola Lawless [facility]iatrist [facility] requests a call back from [place] to discuss possible med change for pt.   Cell # [phone]  Routed to [place]

## 2021-01-26 ENCOUNTER — Other Ambulatory Visit: Payer: Self-pay

## 2021-01-26 DIAGNOSIS — D509 Iron deficiency anemia, unspecified: Secondary | ICD-10-CM

## 2021-01-26 DIAGNOSIS — Z20822 Contact with and (suspected) exposure to covid-19: Secondary | ICD-10-CM | POA: Diagnosis not present

## 2021-01-26 MED ORDER — IRON (FERROUS SULFATE) 325 (65 FE) MG PO TABS: 325.0000 mg | ORAL_TABLET | Freq: Every day | ORAL | 1 refills | Status: DC

## 2021-01-29 DIAGNOSIS — F1020 Alcohol dependence, uncomplicated: Secondary | ICD-10-CM | POA: Diagnosis not present

## 2021-01-30 DIAGNOSIS — F1020 Alcohol dependence, uncomplicated: Secondary | ICD-10-CM | POA: Diagnosis not present

## 2021-01-30 NOTE — Telephone Encounter (Signed)
Dr.Washo left second voice message for Dr.Bensimhon to return call. He would like to discuss psychiatric meds and pts heart condition.   Routed to [place]   Call back #[phone]

## 2021-02-01 DIAGNOSIS — K2100 Gastro-esophageal reflux disease with esophagitis, without bleeding: Secondary | ICD-10-CM | POA: Diagnosis not present

## 2021-02-01 DIAGNOSIS — F1020 Alcohol dependence, uncomplicated: Secondary | ICD-10-CM | POA: Diagnosis not present

## 2021-02-01 DIAGNOSIS — F1220 Cannabis dependence, uncomplicated: Secondary | ICD-10-CM | POA: Diagnosis not present

## 2021-02-01 DIAGNOSIS — F329 Major depressive disorder, single episode, unspecified: Secondary | ICD-10-CM | POA: Diagnosis not present

## 2021-02-01 DIAGNOSIS — F419 Anxiety disorder, unspecified: Secondary | ICD-10-CM | POA: Diagnosis not present

## 2021-02-01 DIAGNOSIS — F1420 Cocaine dependence, uncomplicated: Secondary | ICD-10-CM | POA: Diagnosis not present

## 2021-02-01 DIAGNOSIS — I10 Essential (primary) hypertension: Secondary | ICD-10-CM | POA: Diagnosis not present

## 2021-02-01 DIAGNOSIS — I5022 Chronic systolic (congestive) heart failure: Secondary | ICD-10-CM | POA: Diagnosis not present

## 2021-02-01 DIAGNOSIS — Z86711 Personal history of pulmonary embolism: Secondary | ICD-10-CM | POA: Diagnosis not present

## 2021-02-02 ENCOUNTER — Ambulatory Visit (HOSPITAL_COMMUNITY)
Admission: RE | Admit: 2021-02-02 | Discharge: 2021-02-02 | Disposition: A | Discharge status: Home / Self Care | Payer: BC Managed Care – PPO | Source: Ambulatory Visit | Attending: Internal Medicine | Admitting: Internal Medicine

## 2021-02-02 ENCOUNTER — Encounter (HOSPITAL_COMMUNITY): Payer: Self-pay | Admitting: Internal Medicine

## 2021-02-02 ENCOUNTER — Other Ambulatory Visit: Payer: Self-pay

## 2021-02-02 VITALS — BP 104/70 | HR 79 | Wt 209.8 lb

## 2021-02-02 DIAGNOSIS — I428 Other cardiomyopathies: Secondary | ICD-10-CM | POA: Insufficient documentation

## 2021-02-02 DIAGNOSIS — F17210 Nicotine dependence, cigarettes, uncomplicated: Secondary | ICD-10-CM | POA: Diagnosis not present

## 2021-02-02 DIAGNOSIS — I5022 Chronic systolic (congestive) heart failure: Secondary | ICD-10-CM

## 2021-02-02 DIAGNOSIS — Z20822 Contact with and (suspected) exposure to covid-19: Secondary | ICD-10-CM | POA: Diagnosis not present

## 2021-02-02 DIAGNOSIS — F10229 Alcohol dependence with intoxication, unspecified: Secondary | ICD-10-CM | POA: Insufficient documentation

## 2021-02-02 DIAGNOSIS — Z8249 Family history of ischemic heart disease and other diseases of the circulatory system: Secondary | ICD-10-CM | POA: Insufficient documentation

## 2021-02-02 DIAGNOSIS — Z7984 Long term (current) use of oral hypoglycemic drugs: Secondary | ICD-10-CM | POA: Insufficient documentation

## 2021-02-02 DIAGNOSIS — R0602 Shortness of breath: Secondary | ICD-10-CM | POA: Diagnosis not present

## 2021-02-02 DIAGNOSIS — F1410 Cocaine abuse, uncomplicated: Secondary | ICD-10-CM | POA: Diagnosis not present

## 2021-02-02 DIAGNOSIS — Z86711 Personal history of pulmonary embolism: Secondary | ICD-10-CM | POA: Diagnosis not present

## 2021-02-02 DIAGNOSIS — Z7901 Long term (current) use of anticoagulants: Secondary | ICD-10-CM | POA: Insufficient documentation

## 2021-02-02 DIAGNOSIS — Z09 Encounter for follow-up examination after completed treatment for conditions other than malignant neoplasm: Secondary | ICD-10-CM | POA: Diagnosis not present

## 2021-02-02 DIAGNOSIS — Z79899 Other long term (current) drug therapy: Secondary | ICD-10-CM | POA: Insufficient documentation

## 2021-02-02 DIAGNOSIS — Z720 Tobacco use: Secondary | ICD-10-CM | POA: Diagnosis not present

## 2021-02-02 DIAGNOSIS — I313 Pericardial effusion (noninflammatory): Secondary | ICD-10-CM | POA: Diagnosis not present

## 2021-02-02 DIAGNOSIS — Z8719 Personal history of other diseases of the digestive system: Secondary | ICD-10-CM | POA: Diagnosis not present

## 2021-02-02 DIAGNOSIS — F1010 Alcohol abuse, uncomplicated: Secondary | ICD-10-CM | POA: Diagnosis not present

## 2021-02-02 LAB — BRAIN NATRIURETIC PEPTIDE: B Natriuretic Peptide: 45.1 pg/mL (ref 0.0–100.0)

## 2021-02-02 LAB — BASIC METABOLIC PANEL
Anion gap: 8 (ref 5–15)
BUN: 17 mg/dL (ref 6–20)
CO2: 24 mmol/L (ref 22–32)
Calcium: 9.7 mg/dL (ref 8.9–10.3)
Chloride: 103 mmol/L (ref 98–111)
Creatinine, Ser: 1.09 mg/dL (ref 0.61–1.24)
GFR, Estimated: >60 mL/min (ref >60)
Glucose, Bld: 91 mg/dL (ref 70–99)
Potassium: 5.2 mmol/L — ABNORMAL HIGH (ref 3.5–5.1)
Sodium: 135 mmol/L (ref 135–145)

## 2021-02-02 MED ORDER — FUROSEMIDE 20 MG PO TABS: 20.0000 mg | ORAL_TABLET | Freq: Every day | ORAL | 3 refills | Status: DC

## 2021-02-02 MED ORDER — POTASSIUM CHLORIDE CRYS ER 20 MEQ PO TBCR: 20.0000 meq | EXTENDED_RELEASE_TABLET | Freq: Every day | ORAL | 3 refills | Status: DC

## 2021-02-02 MED ORDER — FOLIC ACID 1 MG PO TABS: 1.0000 mg | ORAL_TABLET | Freq: Every day | ORAL | 3 refills | Status: DC

## 2021-02-02 NOTE — Addendum Note (Signed)
Encounter addended by: Rafael Bihari, FNP on: 02/02/2021 7:56 AM  Actions taken: Clinical Note Signed

## 2021-02-02 NOTE — Progress Notes (Signed)
ADVANCED HF CLINIC NOTE  PCP: [name] NP Primary HF Cardiologist: Dr Haroldine Laws    Reason for F/u: Heart Failure    HPI: Robert [name] is a 40 y/o male with h/o heavy ETOH and cocaine use, PE,  biventricular systolic heart failure and tobacco abuse.   Admitted with acute onset biventricular HF in 5/21. EF 15%. RV down. He was drinking at least 2 beers every night and about 12-24 over the weekend. Also drinking 1/5 of liqour every other day. Cath showed normal coronaries. Elevated filling pressures with normal output. Placed on milrinone and diuresed with IV lasix. As he improved milrinone was weaned off . Transitioned to oral lasix. 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 CT showed small LLL PE. Placed on xarelto. Started on HF meds.  Discharge weight 178 pounds.   Readmitted 6/21 for mild acute blood loss related anemia. EGD showed no active bleeding but evidence of some gastritis.  He was switched to oral PPI twice daily, H&H remained stable and he did not require any transfusions.  He was instructed to continue Xarelto.   Continues to have very frequent ED visits. Admitted 10/25-10/28/21 for ETOH withdrawal symptoms and AKI. Admitted 03/23/2020 with AMS in the setting ETOH abuse. Hypotensive, AKI, and with lactic acidosis. Entresto, spiro, and lasix. All restarted prior to discharge. Returned to the ED on 03/26/20 with nausea/vomiting.   Echo 10/21 EF 30-35% mild RV HK.   Admitted 07/10/20-07/12/20 with seizure in the setting of ETOH withdrawal.   Admitted 07/29/20-07/31/20 with n/v abdominal pain. Had AKI. GI consulted and recommended EGD. He felt better and refused EGD. Plan to continue PPI + carafate. Xarelto and zoloft stopped. Discharged earlier today from the hospital.   Evaluated in the ED 08/19/20 with AKI, 09/01/20 with ETOH withdrawl, and 09/11/20 with atypical chest pain.   Admitted 10/08/20 for CP, SOB, N/V. Blood ethanol level 180. CXR concerning for PNA, possible infected  pneumatocele. Treated with IV abx. Hospitalization c/b AKI and acute ETOH withdrawal, requiring ICU care. Seroquel and sertraline stopped at discharge.  Presented to [facility] 7/8 with diarrhea, N/V. Last ETOH was 2-3 days prior. He was admitted and found to have AKI, likely due to volume depletion from diarrhea and vomiting. SCr peaked at 3.2. Entresto and spiro held, restarted at discharge. SCr down to 1.25 at discharge after gentle IV hydration. Weight 194 lbs.  Today he returns for HF follow up. He did 4 weeks at [facility] and 4 weeks at [facility] for rehab. Some SOB with working out. Denies CP, dizziness, edema, or PND/Orthopnea. Appetite ok. No fever or chills. Weight at home 207 pounds. Taking all medications. He is not sure if he is taking spironolactone. Smoking 1/2-1 ppd. Plans to live with his mother after he gets out of rehab.  Cardiac studies. Echo (5/21): EF <20%  Prominent trabeculation. Grade III DD  R/LHC (5/21): showed normal coronaries, elevated filling pressures with normal output.  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 No tamponade physiology.  Echo (10/21): EF 30-35% RV normal. No effusion.  Echo (2/22): EF 30%   ROS: All systems negative except as listed in HPI, PMH and Problem List.  SH:  Social History   Socioeconomic History   Marital status: Single    Spouse name: Not on file   Number of children: Not on file   Years of education: Not on file   Highest education level:  Not on file  Occupational History   Occupation: Ecologist  Tobacco Use   Smoking status: Every Day    Packs/day: 0.25    Years: 24.00    Pack years: 6.00    Types: Cigarettes   Smokeless tobacco: Never  Vaping Use   Vaping Use: Former  Substance and Sexual Activity   Alcohol use: Yes    Alcohol/week: 8.0 standard drinks    Types: 8 Shots of liquor per week    Comment: heavy  use since age 76   Drug use: Yes    Frequency: 1.0 times per week    Types: Marijuana   Sexual activity: Yes  Other Topics Concern   Not on file  Social History Narrative   Not on file   Social Determinants of Health   Financial Resource Strain: Not on file  Food Insecurity: Not on file  Transportation Needs: Not on file  Physical Activity: Not on file  Stress: Not on file  Social Connections: Not on file  Intimate Partner Violence: Not on file   FH:  Family History  Problem Relation Age of Onset   Heart failure Father    Diabetes Father    Kidney disease Father    Heart disease Father 3   Heart disease Maternal Grandfather 1   Heart attack Maternal Grandfather    Heart disease Paternal Grandfather 21   Lung cancer Maternal Aunt    Past Medical History:  Diagnosis Date   Alcohol use    Alcohol withdrawal ([place]) 03/06/2020   Anxiety    Asthma    CHF (congestive heart failure) (HCC)    Depression    GERD (gastroesophageal reflux disease)    Pulmonary embolism (HCC)    Seizure (HCC)    Current Outpatient Medications  Medication Sig Dispense Refill   dapagliflozin propanediol (FARXIGA) 10 MG TABS tablet Take 1 tablet (10 mg total) by mouth daily before breakfast. 30 tablet 11   digoxin (LANOXIN) 0.125 MG tablet Take 1 tablet (0.125 mg total) by mouth daily. 30 tablet 5   folic acid (FOLVITE) 1 MG tablet Take 1 tablet (1 mg total) by mouth daily. 30 tablet 3   Iron, Ferrous Sulfate, 325 (65 Fe) MG TABS Take 325 mg by mouth daily. 90 tablet 1   ivabradine (CORLANOR) 7.5 MG TABS tablet Take 1 tablet (7.5 mg total) by mouth 2 (two) times daily with a meal. 60 tablet 11   magnesium oxide (MAG-OX) 400 MG tablet Take 400 mg by mouth daily.     MELATONIN PO Take 1 tablet by mouth at bedtime as needed.     ondansetron (ZOFRAN ODT) 4 MG disintegrating tablet Take 1 tablet (4 mg total) by mouth every 8 (eight) hours as needed for nausea or vomiting. 20 tablet 0   pantoprazole  (PROTONIX) 40 MG tablet Take 1 tablet (40 mg total) by mouth daily. 90 tablet 0   QUEtiapine (SEROQUEL) 100 MG tablet Take 1 tablet by mouth at bedtime. As needed     sacubitril-valsartan (ENTRESTO) 97-103 MG Take 1 tablet by mouth 2 (two) times daily. 60 tablet 3   No current facility-administered medications for this encounter.   Wt Readings from Last 3 Encounters:  02/02/21 95.2 kg (209 lb 12.8 oz)  01/12/21 93.1 kg (205 lb 4 oz)  11/30/20 86.5 kg (190 lb 9.6 oz)   BP 104/70   Pulse 79   Wt 95.2 kg (209 lb 12.8 oz)   SpO2 99%  BMI 31.44 kg/m   PHYSICAL EXAM: General:  NAD. No resp difficulty HEENT: Normal Neck: Supple. No JVD. Carotids 2+ bilat; no bruits. No lymphadenopathy or thryomegaly appreciated. Cor: PMI nondisplaced. Regular rate & rhythm. No rubs, gallops or murmurs. Lungs: Clear Abdomen: Soft, nontender, nondistended. No hepatosplenomegaly. No bruits or masses. Good bowel sounds. Extremities: No cyanosis, clubbing, rash, edema Neuro: Alert & oriented x 3, cranial nerves grossly intact. Moves all 4 extremities w/o difficulty. Affect pleasant.  ASSESSMENT & PLAN: 1. Chronic Systolic Heart Failure, NICM  - Echo (5/21): EF 15% RV moderately reduced. Suspect ETOH and cocaine playing a role.  - LHC (5/21): normal cors. 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  - Echo (10/21): EF 30-35% RV normal. No effusion.  - Echo (2/22): EF 30%  - Stable NYHA II. Volume looks ok today on exam, but weight up some. - Stop dig with ongoing substance abuse issues and NYHA II symptoms. - Continue Entresto 97-103 mg bid.  - Continue Coreg 3.125 mg bid.  - Unclear if he is taking spiro. He will check his pill bottles.  - Continue Farxiga 10 mg daily. - Continue Corlanor 7.5 mg bid. - Referred for genetic testing for LVNC.  - Not a candidate for ICD or advanced therapies w/ polysubstance. - Labs today.  2. H/o  Syncope - 07/10/20. - Denies related to ETOH intoxication. - No driving x 6 months per [place] law. He is aware.    3. Pulmonary Emboli - CTA w/ small LLL PE 5/21. - LE Venous dopplers negative for DVT.  - Given small PE. Risk of ongoing AC likely outweighs benefit. - Off Xarelto.   4. ETOH Abuse - Heavy drinker for many years since the age of 5.  - 01/2020 had 28 day detox [facility] but started drinking again.  - Now back in rehab. Congratulated on 60 days sober.  5. H/O Cocaine Abuse - No recent use.  - UDS 10/08/20 negative.  6. Tobacco Abuse - Smoking 1/2 ppd.  7. H/o GIB/ Gastritis w/ ABLA - h/o heavy ETOH abuse + chronic anticoagulation therapy w/ Xarelto - Off xarelto.  - Refused EGD 07/28/20.  Difficult to manage with ongoing addiction issues. He has been discharged from the [facility] program due to on-going non compliance. Seems to be doing better at rehab.   Follow up with APP.  [place], FNP-BC  02/02/21 2:04 PM   Patient seen and examined with the above-signed Advanced Practice Provider and/or Housestaff. I personally reviewed laboratory data, imaging studies and relevant notes. I independently examined the patient and formulated the important aspects of the plan. I have edited the note to reflect any of my changes or salient points. I have personally discussed the plan with the patient and/or family.  Remains in ETOH treatment program. Now 54 days without ETOH. Feeling better. Feels like he has some fluid on board. No orthopnea or PND. Taking meds regualrly.   ReDS 53%   General:  Well appearing. No resp difficulty HEENT: normal Neck: supple. JVP 7 Carotids 2+ bilat; no bruits. No lymphadenopathy or thryomegaly appreciated. Cor: PMI nondisplaced. Regular rate & rhythm. No rubs, gallops or murmurs. Lungs: clear Abdomen: soft, nontender, nondistended. No hepatosplenomegaly. No bruits or masses. Good bowel sounds. Extremities: no cyanosis,  clubbing, rash, edema Neuro: alert & orientedx3, cranial nerves grossly intact. moves all 4 extremities w/o difficulty. Affect pleasant  Overall doing much better  now that his is int treatment program. Appears mildly volume overload. Suspect ReDS overestimates congestion. Will start lasix 20 daily with Kcl 20 daily. Will have him take lasix 40 mg today. Will stop digoxin.   Glori Bickers, MD  10:49 PM

## 2021-02-02 NOTE — Progress Notes (Signed)
ReDS Vest / Clip - 02/02/21 1500       ReDS Vest / Clip   Station Marker D    Ruler Value 27.5    ReDS Value Range High volume overload    ReDS Actual Value 53

## 2021-02-02 NOTE — Patient Instructions (Addendum)
Stop Digoxin  Start Furosemide 20 mg Daily, take 2 tabs first day  Start Potassium 20 meq Daily  Labs done today, your results will be available in MyChart, we will contact you for abnormal readings.  Dr Haroldine Laws has recommended you have an Iron Infusion, we will call you to schedule this  Your physician recommends that you schedule a follow-up appointment in: 1 month  Do the following things EVERYDAY: Weigh yourself in the morning before breakfast. Write it down and keep it in a log. Take your medicines as prescribed Eat low salt foods--Limit salt (sodium) to 2000 mg per day.  Stay as active as you can everyday Limit all fluids for the day to less than 2 liters  If you have any questions or concerns before your next appointment please send [place] a message through [place] or call our office at [phone].    TO LEAVE A MESSAGE FOR THE NURSE SELECT OPTION 2, PLEASE LEAVE A MESSAGE INCLUDING: YOUR NAME DATE OF BIRTH CALL BACK NUMBER REASON FOR CALL**this is important as we prioritize the call backs  YOU WILL RECEIVE A CALL BACK THE SAME DAY AS LONG AS YOU CALL BEFORE 4:00 PM  At the [facility], you and your health needs are our priority. As part of our continuing mission to provide you with exceptional heart care, we have created designated Provider Care Teams. These Care Teams include your primary Cardiologist (physician) and Advanced Practice Providers (APPs- Physician Assistants and Nurse Practitioners) who all work together to provide you with the care you need, when you need it.   You may see any of the following providers on your designated Care Team at your next follow up: Dr Glori Bickers Dr Loralie Champagne Dr Patrice Paradise, NP Lyda Jester, [place] Ginnie Smart Audry Riles, PharmD   Please be sure to bring in all your medications bottles to every appointment.

## 2021-02-05 DIAGNOSIS — F1220 Cannabis dependence, uncomplicated: Secondary | ICD-10-CM | POA: Diagnosis not present

## 2021-02-05 DIAGNOSIS — I5022 Chronic systolic (congestive) heart failure: Secondary | ICD-10-CM | POA: Diagnosis not present

## 2021-02-05 DIAGNOSIS — K2100 Gastro-esophageal reflux disease with esophagitis, without bleeding: Secondary | ICD-10-CM | POA: Diagnosis not present

## 2021-02-05 DIAGNOSIS — F329 Major depressive disorder, single episode, unspecified: Secondary | ICD-10-CM | POA: Diagnosis not present

## 2021-02-05 DIAGNOSIS — I10 Essential (primary) hypertension: Secondary | ICD-10-CM | POA: Diagnosis not present

## 2021-02-05 DIAGNOSIS — F419 Anxiety disorder, unspecified: Secondary | ICD-10-CM | POA: Diagnosis not present

## 2021-02-05 DIAGNOSIS — Z86711 Personal history of pulmonary embolism: Secondary | ICD-10-CM | POA: Diagnosis not present

## 2021-02-05 DIAGNOSIS — F1420 Cocaine dependence, uncomplicated: Secondary | ICD-10-CM | POA: Diagnosis not present

## 2021-02-05 DIAGNOSIS — F1020 Alcohol dependence, uncomplicated: Secondary | ICD-10-CM | POA: Diagnosis not present

## 2021-02-06 ENCOUNTER — Telehealth (HOSPITAL_COMMUNITY): Payer: Self-pay | Admitting: *Deleted

## 2021-02-06 DIAGNOSIS — I5022 Chronic systolic (congestive) heart failure: Secondary | ICD-10-CM | POA: Diagnosis not present

## 2021-02-06 DIAGNOSIS — F419 Anxiety disorder, unspecified: Secondary | ICD-10-CM | POA: Diagnosis not present

## 2021-02-06 DIAGNOSIS — F1420 Cocaine dependence, uncomplicated: Secondary | ICD-10-CM | POA: Diagnosis not present

## 2021-02-06 DIAGNOSIS — F1220 Cannabis dependence, uncomplicated: Secondary | ICD-10-CM | POA: Diagnosis not present

## 2021-02-06 DIAGNOSIS — K2100 Gastro-esophageal reflux disease with esophagitis, without bleeding: Secondary | ICD-10-CM | POA: Diagnosis not present

## 2021-02-06 DIAGNOSIS — Z86711 Personal history of pulmonary embolism: Secondary | ICD-10-CM | POA: Diagnosis not present

## 2021-02-06 DIAGNOSIS — F1020 Alcohol dependence, uncomplicated: Secondary | ICD-10-CM | POA: Diagnosis not present

## 2021-02-06 DIAGNOSIS — F329 Major depressive disorder, single episode, unspecified: Secondary | ICD-10-CM | POA: Diagnosis not present

## 2021-02-06 DIAGNOSIS — I10 Essential (primary) hypertension: Secondary | ICD-10-CM | POA: Diagnosis not present

## 2021-02-06 NOTE — Telephone Encounter (Signed)
-----   Message from Jolaine Artist, MD sent at 02/03/2021  2:21 PM EDT ----- Please repeat BMET in 2 weeks  ----- Message ----- From: Interface, Lab In [place] Sent: 02/02/2021   4:16 PM EDT To: Jolaine Artist, MD

## 2021-02-06 NOTE — Telephone Encounter (Signed)
Pt aware and ask about stopping KCL, KCL was just started 9/23, advised pt to hold for now, repeat lab sch for 10/5, he has not heard from infusion center about iron infusion, advised I would f/u with them tomorrow

## 2021-02-08 DIAGNOSIS — I5022 Chronic systolic (congestive) heart failure: Secondary | ICD-10-CM | POA: Diagnosis not present

## 2021-02-08 DIAGNOSIS — I10 Essential (primary) hypertension: Secondary | ICD-10-CM | POA: Diagnosis not present

## 2021-02-08 DIAGNOSIS — Z86711 Personal history of pulmonary embolism: Secondary | ICD-10-CM | POA: Diagnosis not present

## 2021-02-08 DIAGNOSIS — F1420 Cocaine dependence, uncomplicated: Secondary | ICD-10-CM | POA: Diagnosis not present

## 2021-02-08 DIAGNOSIS — F1220 Cannabis dependence, uncomplicated: Secondary | ICD-10-CM | POA: Diagnosis not present

## 2021-02-08 DIAGNOSIS — F419 Anxiety disorder, unspecified: Secondary | ICD-10-CM | POA: Diagnosis not present

## 2021-02-08 DIAGNOSIS — K2100 Gastro-esophageal reflux disease with esophagitis, without bleeding: Secondary | ICD-10-CM | POA: Diagnosis not present

## 2021-02-08 DIAGNOSIS — F1020 Alcohol dependence, uncomplicated: Secondary | ICD-10-CM | POA: Diagnosis not present

## 2021-02-08 DIAGNOSIS — F329 Major depressive disorder, single episode, unspecified: Secondary | ICD-10-CM | POA: Diagnosis not present

## 2021-02-12 DIAGNOSIS — K2100 Gastro-esophageal reflux disease with esophagitis, without bleeding: Secondary | ICD-10-CM | POA: Diagnosis not present

## 2021-02-12 DIAGNOSIS — Z86711 Personal history of pulmonary embolism: Secondary | ICD-10-CM | POA: Diagnosis not present

## 2021-02-12 DIAGNOSIS — I5022 Chronic systolic (congestive) heart failure: Secondary | ICD-10-CM | POA: Diagnosis not present

## 2021-02-12 DIAGNOSIS — F329 Major depressive disorder, single episode, unspecified: Secondary | ICD-10-CM | POA: Diagnosis not present

## 2021-02-12 DIAGNOSIS — I10 Essential (primary) hypertension: Secondary | ICD-10-CM | POA: Diagnosis not present

## 2021-02-12 DIAGNOSIS — F1020 Alcohol dependence, uncomplicated: Secondary | ICD-10-CM | POA: Diagnosis not present

## 2021-02-12 DIAGNOSIS — F1420 Cocaine dependence, uncomplicated: Secondary | ICD-10-CM | POA: Diagnosis not present

## 2021-02-12 DIAGNOSIS — F419 Anxiety disorder, unspecified: Secondary | ICD-10-CM | POA: Diagnosis not present

## 2021-02-12 DIAGNOSIS — F1220 Cannabis dependence, uncomplicated: Secondary | ICD-10-CM | POA: Diagnosis not present

## 2021-02-13 DIAGNOSIS — Z86711 Personal history of pulmonary embolism: Secondary | ICD-10-CM | POA: Diagnosis not present

## 2021-02-13 DIAGNOSIS — K2100 Gastro-esophageal reflux disease with esophagitis, without bleeding: Secondary | ICD-10-CM | POA: Diagnosis not present

## 2021-02-13 DIAGNOSIS — F1220 Cannabis dependence, uncomplicated: Secondary | ICD-10-CM | POA: Diagnosis not present

## 2021-02-13 DIAGNOSIS — F1020 Alcohol dependence, uncomplicated: Secondary | ICD-10-CM | POA: Diagnosis not present

## 2021-02-13 DIAGNOSIS — F329 Major depressive disorder, single episode, unspecified: Secondary | ICD-10-CM | POA: Diagnosis not present

## 2021-02-13 DIAGNOSIS — I10 Essential (primary) hypertension: Secondary | ICD-10-CM | POA: Diagnosis not present

## 2021-02-13 DIAGNOSIS — I5022 Chronic systolic (congestive) heart failure: Secondary | ICD-10-CM | POA: Diagnosis not present

## 2021-02-13 DIAGNOSIS — F419 Anxiety disorder, unspecified: Secondary | ICD-10-CM | POA: Diagnosis not present

## 2021-02-13 DIAGNOSIS — F1420 Cocaine dependence, uncomplicated: Secondary | ICD-10-CM | POA: Diagnosis not present

## 2021-02-14 ENCOUNTER — Other Ambulatory Visit: Payer: Self-pay

## 2021-02-14 ENCOUNTER — Ambulatory Visit (HOSPITAL_COMMUNITY)
Admission: RE | Admit: 2021-02-14 | Discharge: 2021-02-14 | Disposition: A | Discharge status: Home / Self Care | Payer: BC Managed Care – PPO | Source: Ambulatory Visit | Attending: Cardiology | Admitting: Cardiology

## 2021-02-14 DIAGNOSIS — I5022 Chronic systolic (congestive) heart failure: Secondary | ICD-10-CM | POA: Diagnosis not present

## 2021-02-14 LAB — BASIC METABOLIC PANEL
Anion gap: 9 (ref 5–15)
BUN: 16 mg/dL (ref 6–20)
CO2: 24 mmol/L (ref 22–32)
Calcium: 9.1 mg/dL (ref 8.9–10.3)
Chloride: 104 mmol/L (ref 98–111)
Creatinine, Ser: 0.93 mg/dL (ref 0.61–1.24)
GFR, Estimated: >60 mL/min (ref >60)
Glucose, Bld: 96 mg/dL (ref 70–99)
Potassium: 4.2 mmol/L (ref 3.5–5.1)
Sodium: 137 mmol/L (ref 135–145)

## 2021-02-15 DIAGNOSIS — F1420 Cocaine dependence, uncomplicated: Secondary | ICD-10-CM | POA: Diagnosis not present

## 2021-02-15 DIAGNOSIS — I10 Essential (primary) hypertension: Secondary | ICD-10-CM | POA: Diagnosis not present

## 2021-02-15 DIAGNOSIS — F1220 Cannabis dependence, uncomplicated: Secondary | ICD-10-CM | POA: Diagnosis not present

## 2021-02-15 DIAGNOSIS — F329 Major depressive disorder, single episode, unspecified: Secondary | ICD-10-CM | POA: Diagnosis not present

## 2021-02-15 DIAGNOSIS — Z86711 Personal history of pulmonary embolism: Secondary | ICD-10-CM | POA: Diagnosis not present

## 2021-02-15 DIAGNOSIS — K2100 Gastro-esophageal reflux disease with esophagitis, without bleeding: Secondary | ICD-10-CM | POA: Diagnosis not present

## 2021-02-15 DIAGNOSIS — I5022 Chronic systolic (congestive) heart failure: Secondary | ICD-10-CM | POA: Diagnosis not present

## 2021-02-15 DIAGNOSIS — F1020 Alcohol dependence, uncomplicated: Secondary | ICD-10-CM | POA: Diagnosis not present

## 2021-02-15 DIAGNOSIS — F419 Anxiety disorder, unspecified: Secondary | ICD-10-CM | POA: Diagnosis not present

## 2021-02-16 ENCOUNTER — Other Ambulatory Visit: Payer: Self-pay

## 2021-02-19 ENCOUNTER — Ambulatory Visit (INDEPENDENT_AMBULATORY_CARE_PROVIDER_SITE_OTHER): Payer: BC Managed Care – PPO | Admitting: Nurse Practitioner

## 2021-02-19 ENCOUNTER — Other Ambulatory Visit (HOSPITAL_COMMUNITY): Payer: Self-pay | Admitting: *Deleted

## 2021-02-19 ENCOUNTER — Encounter: Payer: Self-pay | Admitting: Nurse Practitioner

## 2021-02-19 ENCOUNTER — Other Ambulatory Visit: Payer: Self-pay

## 2021-02-19 VITALS — BP 110/60 | HR 74 | Temp 98.1°F | Ht 70.0 in | Wt 211.0 lb

## 2021-02-19 DIAGNOSIS — F1020 Alcohol dependence, uncomplicated: Secondary | ICD-10-CM | POA: Diagnosis not present

## 2021-02-19 DIAGNOSIS — F39 Unspecified mood [affective] disorder: Secondary | ICD-10-CM

## 2021-02-19 DIAGNOSIS — G44219 Episodic tension-type headache, not intractable: Secondary | ICD-10-CM

## 2021-02-19 DIAGNOSIS — I5022 Chronic systolic (congestive) heart failure: Secondary | ICD-10-CM

## 2021-02-19 DIAGNOSIS — Z720 Tobacco use: Secondary | ICD-10-CM

## 2021-02-19 DIAGNOSIS — K2100 Gastro-esophageal reflux disease with esophagitis, without bleeding: Secondary | ICD-10-CM | POA: Diagnosis not present

## 2021-02-19 DIAGNOSIS — Z86711 Personal history of pulmonary embolism: Secondary | ICD-10-CM | POA: Diagnosis not present

## 2021-02-19 DIAGNOSIS — F1420 Cocaine dependence, uncomplicated: Secondary | ICD-10-CM | POA: Diagnosis not present

## 2021-02-19 DIAGNOSIS — F5102 Adjustment insomnia: Secondary | ICD-10-CM

## 2021-02-19 DIAGNOSIS — F419 Anxiety disorder, unspecified: Secondary | ICD-10-CM | POA: Diagnosis not present

## 2021-02-19 DIAGNOSIS — I10 Essential (primary) hypertension: Secondary | ICD-10-CM | POA: Diagnosis not present

## 2021-02-19 DIAGNOSIS — F1220 Cannabis dependence, uncomplicated: Secondary | ICD-10-CM | POA: Diagnosis not present

## 2021-02-19 DIAGNOSIS — D508 Other iron deficiency anemias: Secondary | ICD-10-CM

## 2021-02-19 DIAGNOSIS — F329 Major depressive disorder, single episode, unspecified: Secondary | ICD-10-CM | POA: Diagnosis not present

## 2021-02-19 NOTE — Progress Notes (Signed)
Subjective:  Patient ID: Robert [name], male    DOB: 07/02/1980  Age: 40 y.o. MRN: [id number]  CC: Acute Visit (Pt c/o issues sleeping for over 2 months. Pt states he was taking medication to help with sleeping but it was not working so he stopped it. Pt states he has also been getting a lot of headaches. /Declines flu vaccine. )  Insomnia Primary symptoms: fragmented sleep, no sleep disturbance, no difficulty falling asleep, somnolence, frequent awakening, malaise/fatigue, napping.   The current episode started more than one month. The onset quality is gradual. The problem occurs nightly. The symptoms are aggravated by caffeine and tobacco. How many beverages per day that contain caffeine: 6 or more.  Types of beverages you drink: coffee. Relieved by: sleepy time tea. Past treatments include medication. The treatment provided moderate relief. Typical bedtime:  11-12 P.M..  How long after going to bed to you fall asleep: less than 15 minutes.   Duration of naps:  One to two hours.  PMH includes: hypertension, depression, no family stress or anxiety, no restless leg syndrome, no work related stressors, no chronic pain, no apnea.  Prior diagnostic workup includes:  Blood work.   Mood disorder ([place]) Participates in a day program. Denies any alcohol use in last 30days. D/c use of seroquel, due to daytime somnolence D/c zoloft due to stuttering. Previous use of trazodone, unable to remember why medications was discontinued. Denies need for psychiatry referral Current weekly psychology sessions. No SI/HI/hallucination/delusional thoughts  Tobacco abuse Current use of 1/2-1ppd. Has nicotine patches 21mg , but has not used them Advised to use patches to help decrease tobacco use with the goal to quit eventually. He verbalized understanding  Adjustment insomnia Worsening with discontinuation of ETOH use. Bedtime at22midnight Wakes up at 5am Bedtime routine: shower and use electronic device  while laying in bed Caffeine: 4-5cups-16oz each of coffee, no caffeinated sodas No improvement with seroquel, he stopped medication 2weeks ago Previous use of trazodone, but does not remember why medication was discontinued. Current use of sleepy time tea with some improvement.  Advised to switch to decaf coffee, avoid caffiene afternoon, avoid tobacco use close to bedtime. avoid use of electronic device in bedroom. Provided printed information on behavioral changes to improve quality of sleep. He verbalized understanding  Episodic tension-type headache, not intractable Onset of headache since Etoh use cessation,  Location: occipital  Describes as heavy head, throbbing,  Headache improve with sleep and use of ibuprofen Waxing and waning Starts in evening No neurological symptoms.  With hx of gastritis and AKI, advised to avoid use of NSAIDs. Advised to use tylenol for headache, working on improving sleep quality. BP Readings from Last 3 Encounters:  02/19/21 110/60  02/02/21 104/70  01/12/21 (!) 144/90    Wt Readings from Last 3 Encounters:  02/19/21 211 lb (95.7 kg)  02/02/21 209 lb 12.8 oz (95.2 kg)  01/12/21 205 lb 4 oz (93.1 kg)    Reviewed past Medical, Social and Family history today.  Outpatient Medications Prior to Visit  Medication Sig Dispense Refill   carvedilol (COREG) 3.125 MG tablet Take 3.125 mg by mouth 2 (two) times daily with a meal.     dapagliflozin propanediol (FARXIGA) 10 MG TABS tablet Take 1 tablet (10 mg total) by mouth daily before breakfast. 30 tablet 11   folic acid (FOLVITE) 1 MG tablet Take 1 tablet (1 mg total) by mouth daily. 30 tablet 3   furosemide (LASIX) 20 MG tablet Take 1 tablet (  20 mg total) by mouth daily. 30 tablet 3   ibuprofen (ADVIL) 400 MG tablet Take 400 mg by mouth 3 (three) times daily as needed for moderate pain.     Iron, Ferrous Sulfate, 325 (65 Fe) MG TABS Take 325 mg by mouth daily. 90 tablet 1   ivabradine (CORLANOR) 7.5 MG  TABS tablet Take 1 tablet (7.5 mg total) by mouth 2 (two) times daily with a meal. 60 tablet 11   magnesium oxide (MAG-OX) 400 MG tablet Take 400 mg by mouth daily.     nicotine (NICODERM CQ - DOSED IN MG/24 HOURS) 21 mg/24hr patch Place 21 mg onto the skin daily.     ondansetron (ZOFRAN ODT) 4 MG disintegrating tablet Take 1 tablet (4 mg total) by mouth every 8 (eight) hours as needed for nausea or vomiting. 20 tablet 0   pantoprazole (PROTONIX) 40 MG tablet Take 1 tablet (40 mg total) by mouth daily. 90 tablet 0   penicillin v potassium (VEETID) 500 MG tablet Take 500 mg by mouth 4 (four) times daily.     sacubitril-valsartan (ENTRESTO) 97-103 MG Take 1 tablet by mouth 2 (two) times daily. 60 tablet 3   QUEtiapine (SEROQUEL) 100 MG tablet Take 1 tablet by mouth at bedtime. As needed (Patient not taking: Reported on 02/19/2021)     MELATONIN PO Take 1 tablet by mouth at bedtime as needed. (Patient not taking: Reported on 02/19/2021)     No facility-administered medications prior to visit.    ROS See HPI  Objective:  BP 110/60 (BP Location: Left Arm, Patient Position: Sitting, Cuff Size: Large)   Pulse 74   Temp 98.1 F (36.7 C) (Temporal)   Ht 5\' 10"  (1.778 m)   Wt 211 lb (95.7 kg)   SpO2 99%   BMI 30.28 kg/m   Physical Exam  Assessment & Plan:  This visit occurred during the SARS-CoV-2 public health emergency.  Safety protocols were in place, including screening questions prior to the visit, additional usage of staff PPE, and extensive cleaning of exam room while observing appropriate contact time as indicated for disinfecting solutions.   Kiondre was seen today for acute visit.  Diagnoses and all orders for this visit:  Mood disorder ([place])  Adjustment insomnia  Episodic tension-type headache, not intractable  Tobacco abuse   Problem List Items Addressed This Visit       Nervous and Auditory   Episodic tension-type headache, not intractable    Onset of headache  since Etoh use cessation,  Location: occipital  Describes as heavy head, throbbing,  Headache improve with sleep and use of ibuprofen Waxing and waning Starts in evening No neurological symptoms.  With hx of gastritis and AKI, advised to avoid use of NSAIDs. Advised to use tylenol for headache, working on improving sleep quality.      Relevant Medications   carvedilol (COREG) 3.125 MG tablet     Other   Adjustment insomnia    Worsening with discontinuation of ETOH use. Bedtime at63midnight Wakes up at 5am Bedtime routine: shower and use electronic device while laying in bed Caffeine: 4-5cups-16oz each of coffee, no caffeinated sodas No improvement with seroquel, he stopped medication 2weeks ago Previous use of trazodone, but does not remember why medication was discontinued. Current use of sleepy time tea with some improvement.  Advised to switch to decaf coffee, avoid caffiene afternoon, avoid tobacco use close to bedtime. avoid use of electronic device in bedroom. Provided printed information on behavioral changes to  improve quality of sleep. He verbalized understanding      Mood disorder ([place]) - Primary    Participates in a day program. Denies any alcohol use in last 30days. D/c use of seroquel, due to daytime somnolence D/c zoloft due to stuttering. Previous use of trazodone, unable to remember why medications was discontinued. Denies need for psychiatry referral Current weekly psychology sessions. No SI/HI/hallucination/delusional thoughts      Tobacco abuse    Current use of 1/2-1ppd. Has nicotine patches 21mg , but has not used them Advised to use patches to help decrease tobacco use with the goal to quit eventually. He verbalized understanding       I have spent 8mins with this patient regarding history taking, documentation, review of lab results and specialty note, formulating plan and discussing treatment options with patient.  Follow-up: Return in about 4  weeks (around 03/19/2021) for insomnia and headache.  [name], NP

## 2021-02-19 NOTE — Patient Instructions (Signed)
Decrease caffeine consumption to 1-2cups per day Avoid caffeinated drinks after 12noon Use tylenol 500-650mg  every 8hrs for headache.  Insomnia Insomnia is a sleep disorder that makes it difficult to fall asleep or stay asleep. Insomnia can cause fatigue, low energy, difficulty concentrating, mood swings, and poor performance at work or school. There are three different ways to classify insomnia: Difficulty falling asleep. Difficulty staying asleep. Waking up too early in the morning. Any type of insomnia can be long-term (chronic) or short-term (acute). Both are common. Short-term insomnia usually lasts for three months or less. Chronic insomnia occurs at least three times a week for longer than three months. What are the causes? Insomnia may be caused by another condition, situation, or substance, such as: Anxiety. Certain medicines. Gastroesophageal reflux disease (GERD) or other gastrointestinal conditions. Asthma or other breathing conditions. Restless legs syndrome, sleep apnea, or other sleep disorders. Chronic pain. Menopause. Stroke. Abuse of alcohol, tobacco, or illegal drugs. Mental health conditions, such as depression. Caffeine. Neurological disorders, such as Alzheimer's disease. An overactive thyroid (hyperthyroidism). Sometimes, the cause of insomnia may not be known. What increases the risk? Risk factors for insomnia include: Gender. Women are affected more often than men. Age. Insomnia is more common as you get older. Stress. Lack of exercise. Irregular work schedule or working night shifts. Traveling between different time zones. Certain medical and mental health conditions. What are the signs or symptoms? If you have insomnia, the main symptom is having trouble falling asleep or having trouble staying asleep. This may lead to other symptoms, such as: Feeling fatigued or having low energy. Feeling nervous about going to sleep. Not feeling rested in the  morning. Having trouble concentrating. Feeling irritable, anxious, or depressed. How is this diagnosed? This condition may be diagnosed based on: Your symptoms and medical history. Your health care provider may ask about: Your sleep habits. Any medical conditions you have. Your mental health. A physical exam. How is this treated? Treatment for insomnia depends on the cause. Treatment may focus on treating an underlying condition that is causing insomnia. Treatment may also include: Medicines to help you sleep. Counseling or therapy. Lifestyle adjustments to help you sleep better. Follow these instructions at home: Eating and drinking  Limit or avoid alcohol, caffeinated beverages, and cigarettes, especially close to bedtime. These can disrupt your sleep. Do not eat a large meal or eat spicy foods right before bedtime. This can lead to digestive discomfort that can make it hard for you to sleep. Sleep habits  Keep a sleep diary to help you and your health care provider figure out what could be causing your insomnia. Write down: When you sleep. When you wake up during the night. How well you sleep. How rested you feel the next day. Any side effects of medicines you are taking. What you eat and drink. Make your bedroom a dark, comfortable place where it is easy to fall asleep. Put up shades or blackout curtains to block light from outside. Use a white noise machine to block noise. Keep the temperature cool. Limit screen use before bedtime. This includes: Watching TV. Using your smartphone, tablet, or computer. Stick to a routine that includes going to bed and waking up at the same times every day and night. This can help you fall asleep faster. Consider making a quiet activity, such as reading, part of your nighttime routine. Try to avoid taking naps during the day so that you sleep better at night. Get out of bed if you  are still awake after 15 minutes of trying to sleep. Keep the  lights down, but try reading or doing a quiet activity. When you feel sleepy, go back to bed. General instructions Take over-the-counter and prescription medicines only as told by your health care provider. Exercise regularly, as told by your health care provider. Avoid exercise starting several hours before bedtime. Use relaxation techniques to manage stress. Ask your health care provider to suggest some techniques that may work well for you. These may include: Breathing exercises. Routines to release muscle tension. Visualizing peaceful scenes. Make sure that you drive carefully. Avoid driving if you feel very sleepy. Keep all follow-up visits as told by your health care provider. This is important. Contact a health care provider if: You are tired throughout the day. You have trouble in your daily routine due to sleepiness. You continue to have sleep problems, or your sleep problems get worse. Get help right away if: You have serious thoughts about hurting yourself or someone else. If you ever feel like you may hurt yourself or others, or have thoughts about taking your own life, get help right away. You can go to your nearest emergency department or call: Your local emergency services (911 in the [place] A suicide crisis helpline, such as the [facility] at 908-053-3286. This is open 24 hours a day. Summary Insomnia is a sleep disorder that makes it difficult to fall asleep or stay asleep. Insomnia can be long-term (chronic) or short-term (acute). Treatment for insomnia depends on the cause. Treatment may focus on treating an underlying condition that is causing insomnia. Keep a sleep diary to help you and your health care provider figure out what could be causing your insomnia. This information is not intended to replace advice given to you by your health care provider. Make sure you discuss any questions you have with your health care provider. Document  Revised: 03/09/2020 Document Reviewed: 03/09/2020 Elsevier Patient Education  2022 [employer].

## 2021-02-19 NOTE — Assessment & Plan Note (Signed)
Participates in a day program. Denies any alcohol use in last 30days. D/c use of seroquel, due to daytime somnolence D/c zoloft due to stuttering. Previous use of trazodone, unable to remember why medications was discontinued. Denies need for psychiatry referral Current weekly psychology sessions. No SI/HI/hallucination/delusional thoughts

## 2021-02-19 NOTE — Assessment & Plan Note (Signed)
Current use of 1/2-1ppd. Has nicotine patches 21mg , but has not used them Advised to use patches to help decrease tobacco use with the goal to quit eventually. He verbalized understanding

## 2021-02-19 NOTE — Assessment & Plan Note (Signed)
Onset of headache since Etoh use cessation,  Location: occipital  Describes as heavy head, throbbing,  Headache improve with sleep and use of ibuprofen Waxing and waning Starts in evening No neurological symptoms.  With hx of gastritis and AKI, advised to avoid use of NSAIDs. Advised to use tylenol for headache, working on improving sleep quality.

## 2021-02-19 NOTE — Assessment & Plan Note (Signed)
Worsening with discontinuation of ETOH use. Bedtime at73midnight Wakes up at 5am Bedtime routine: shower and use electronic device while laying in bed Caffeine: 4-5cups-16oz each of coffee, no caffeinated sodas No improvement with seroquel, he stopped medication 2weeks ago Previous use of trazodone, but does not remember why medication was discontinued. Current use of sleepy time tea with some improvement.  Advised to switch to decaf coffee, avoid caffiene afternoon, avoid tobacco use close to bedtime. avoid use of electronic device in bedroom. Provided printed information on behavioral changes to improve quality of sleep. He verbalized understanding

## 2021-02-20 DIAGNOSIS — F329 Major depressive disorder, single episode, unspecified: Secondary | ICD-10-CM | POA: Diagnosis not present

## 2021-02-20 DIAGNOSIS — F419 Anxiety disorder, unspecified: Secondary | ICD-10-CM | POA: Diagnosis not present

## 2021-02-20 DIAGNOSIS — F1020 Alcohol dependence, uncomplicated: Secondary | ICD-10-CM | POA: Diagnosis not present

## 2021-02-20 DIAGNOSIS — I5022 Chronic systolic (congestive) heart failure: Secondary | ICD-10-CM | POA: Diagnosis not present

## 2021-02-20 DIAGNOSIS — I10 Essential (primary) hypertension: Secondary | ICD-10-CM | POA: Diagnosis not present

## 2021-02-20 DIAGNOSIS — F1220 Cannabis dependence, uncomplicated: Secondary | ICD-10-CM | POA: Diagnosis not present

## 2021-02-20 DIAGNOSIS — K2100 Gastro-esophageal reflux disease with esophagitis, without bleeding: Secondary | ICD-10-CM | POA: Diagnosis not present

## 2021-02-20 DIAGNOSIS — Z86711 Personal history of pulmonary embolism: Secondary | ICD-10-CM | POA: Diagnosis not present

## 2021-02-20 DIAGNOSIS — F1420 Cocaine dependence, uncomplicated: Secondary | ICD-10-CM | POA: Diagnosis not present

## 2021-02-22 DIAGNOSIS — F419 Anxiety disorder, unspecified: Secondary | ICD-10-CM | POA: Diagnosis not present

## 2021-02-22 DIAGNOSIS — I5022 Chronic systolic (congestive) heart failure: Secondary | ICD-10-CM | POA: Diagnosis not present

## 2021-02-22 DIAGNOSIS — F1220 Cannabis dependence, uncomplicated: Secondary | ICD-10-CM | POA: Diagnosis not present

## 2021-02-22 DIAGNOSIS — F1020 Alcohol dependence, uncomplicated: Secondary | ICD-10-CM | POA: Diagnosis not present

## 2021-02-22 DIAGNOSIS — I10 Essential (primary) hypertension: Secondary | ICD-10-CM | POA: Diagnosis not present

## 2021-02-22 DIAGNOSIS — F329 Major depressive disorder, single episode, unspecified: Secondary | ICD-10-CM | POA: Diagnosis not present

## 2021-02-22 DIAGNOSIS — Z86711 Personal history of pulmonary embolism: Secondary | ICD-10-CM | POA: Diagnosis not present

## 2021-02-22 DIAGNOSIS — K2100 Gastro-esophageal reflux disease with esophagitis, without bleeding: Secondary | ICD-10-CM | POA: Diagnosis not present

## 2021-02-22 DIAGNOSIS — F1420 Cocaine dependence, uncomplicated: Secondary | ICD-10-CM | POA: Diagnosis not present

## 2021-02-28 ENCOUNTER — Other Ambulatory Visit: Payer: Self-pay

## 2021-02-28 ENCOUNTER — Ambulatory Visit (HOSPITAL_COMMUNITY)
Admission: RE | Admit: 2021-02-28 | Discharge: 2021-02-28 | Disposition: A | Discharge status: Home / Self Care | Payer: BC Managed Care – PPO | Source: Ambulatory Visit | Attending: Internal Medicine | Admitting: Internal Medicine

## 2021-02-28 DIAGNOSIS — D508 Other iron deficiency anemias: Secondary | ICD-10-CM

## 2021-02-28 DIAGNOSIS — I5022 Chronic systolic (congestive) heart failure: Secondary | ICD-10-CM

## 2021-02-28 MED ORDER — SODIUM CHLORIDE 0.9 % IV SOLN
250.0000 mg | INTRAVENOUS | Status: DC
  Administered 2021-02-28: 250 mg via INTRAVENOUS
  Filled 2021-02-28: qty 20

## 2021-03-04 NOTE — Progress Notes (Signed)
ADVANCED HF CLINIC NOTE  PCP: [name] NP Primary HF Cardiologist: Dr Haroldine Laws    Reason for F/u: Heart Failure    HPI: [name] is a 40 y.o. male with h/o heavy ETOH and cocaine use, PE,  biventricular systolic heart failure and tobacco abuse.   Admitted with acute onset biventricular HF in 5/21. EF 15%. RV down. He was drinking at least 2 beers every night and about 12-24 over the weekend. Also drinking 1/5 of liqour every other day. Cath showed normal coronaries. Elevated filling pressures with normal output. Placed on milrinone and diuresed with IV lasix. As he improved milrinone was weaned off . Transitioned to oral lasix. 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 CT showed small LLL PE. Placed on xarelto. Started on HF meds.  Discharge weight 178 pounds.   Readmitted 6/21 for mild acute blood loss related anemia. EGD showed no active bleeding but evidence of some gastritis.  He was switched to oral PPI twice daily, H&H remained stable and he did not require any transfusions.  He was instructed to continue Xarelto.   Continues to have very frequent ED visits. Admitted 10/25-10/28/21 for ETOH withdrawal symptoms and AKI. Admitted 03/23/2020 with AMS in the setting ETOH abuse. Hypotensive, AKI, and with lactic acidosis. Entresto, spiro, and lasix. All restarted prior to discharge. Returned to the ED on 03/26/20 with nausea/vomiting.   Echo 10/21 EF 30-35% mild RV HK.   Admitted 07/10/20-07/12/20 with seizure in the setting of ETOH withdrawal.   Admitted 07/29/20-07/31/20 with n/v abdominal pain. Had AKI. GI consulted and recommended EGD. He felt better and refused EGD. Plan to continue PPI + carafate. Xarelto and zoloft stopped. Discharged earlier today from the hospital.   Evaluated in the ED 08/19/20 with AKI, 09/01/20 with ETOH withdrawl, and 09/11/20 with atypical chest pain.   Admitted 10/08/20 for CP, SOB, N/V. Blood ethanol level 180. CXR concerning for PNA, possible infected  pneumatocele. Treated with IV abx. Hospitalization c/b AKI and acute ETOH withdrawal, requiring ICU care. Seroquel and sertraline stopped at discharge.  Presented to [facility] 7/8 with diarrhea, N/V. Last ETOH was 2-3 days prior. He was admitted and found to have AKI, likely due to volume depletion from diarrhea and vomiting. SCr peaked at 3.2. Entresto and spiro held, restarted at discharge. SCr down to 1.25 at discharge after gentle IV hydration. Weight 194 lbs.  Today he returns for HF follow up. He is 90 days sober. Some SOB with stairs or carrying groceries. Overall feeling fine. Denies CP, dizziness, edema, or PND/Orthopnea. Appetite ok. No fever or chills. Weight at home 210 pounds but has noticed weight going up. Taking all medications. Smoking 1/2 ppd.  Cardiac studies. Echo (5/21): EF <20%  Prominent trabeculation. Grade III DD  R/LHC (5/21): showed normal coronaries, elevated filling pressures with normal output.  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 No tamponade physiology.  Echo (10/21): EF 30-35% RV normal. No effusion.  Echo (2/22): EF 30%   ROS: All systems negative except as listed in HPI, PMH and Problem List.  SH:  Social History   Socioeconomic History   Marital status: Single    Spouse name: Not on file   Number of children: Not on file   Years of education: Not on file   Highest education level: Not on file  Occupational History   Occupation: [employer]  Tobacco Use   Smoking status:  Every Day    Packs/day: 0.25    Years: 24.00    Pack years: 6.00    Types: Cigarettes   Smokeless tobacco: Never  Vaping Use   Vaping Use: Former  Substance and Sexual Activity   Alcohol use: Yes    Alcohol/week: 8.0 standard drinks    Types: 8 Shots of liquor per week    Comment: heavy use since age 32   Drug use: Yes    Frequency: 1.0 times per week    Types:  Marijuana   Sexual activity: Yes  Other Topics Concern   Not on file  Social History Narrative   Not on file   Social Determinants of Health   Financial Resource Strain: Not on file  Food Insecurity: Not on file  Transportation Needs: Not on file  Physical Activity: Not on file  Stress: Not on file  Social Connections: Not on file  Intimate Partner Violence: Not on file   FH:  Family History  Problem Relation Age of Onset   Heart failure Father    Diabetes Father    Kidney disease Father    Heart disease Father 1   Heart disease Maternal Grandfather 32   Heart attack Maternal Grandfather    Heart disease Paternal Grandfather 1   Lung cancer Maternal Aunt    Past Medical History:  Diagnosis Date   AKI (acute kidney injury) ([place]) 10/30/2019   Alcohol use    Alcohol withdrawal ([place]) 03/06/2020   Anxiety    Asthma    CHF (congestive heart failure) (H[facility])    Depression    GERD (gastroesophageal reflux disease)    Pulmonary embolism ([facility])    Seizure ([facility])    Current Outpatient Medications  Medication Sig Dispense Refill   carvedilol (COREG) 3.125 MG tablet Take 3.125 mg by mouth 2 (two) times daily with a meal.     dapagliflozin propanediol (FARXIGA) 10 MG TABS tablet Take 1 tablet (10 mg total) by mouth daily before breakfast. 30 tablet 11   folic acid (FOLVITE) 1 MG tablet Take 1 tablet (1 mg total) by mouth daily. 30 tablet 3   furosemide (LASIX) 20 MG tablet Take 1 tablet (20 mg total) by mouth daily. 30 tablet 3   ibuprofen (ADVIL) 400 MG tablet Take 400 mg by mouth 3 (three) times daily as needed for moderate pain.     ivabradine (CORLANOR) 7.5 MG TABS tablet Take 1 tablet (7.5 mg total) by mouth 2 (two) times daily with a meal. 60 tablet 11   magnesium oxide (MAG-OX) 400 MG tablet Take 400 mg by mouth daily. As needed     nicotine (NICODERM CQ - DOSED IN MG/24 HOURS) 21 mg/24hr patch Place 21 mg onto the skin daily. As needed     ondansetron (ZOFRAN ODT) 4 MG  disintegrating tablet Take 1 tablet (4 mg total) by mouth every 8 (eight) hours as needed for nausea or vomiting. 20 tablet 0   pantoprazole (PROTONIX) 40 MG tablet Take 1 tablet (40 mg total) by mouth daily. 90 tablet 0   penicillin v potassium (VEETID) 500 MG tablet Take 500 mg by mouth 4 (four) times daily.     sacubitril-valsartan (ENTRESTO) 97-103 MG Take 1 tablet by mouth 2 (two) times daily. 60 tablet 3   No current facility-administered medications for this encounter.   Wt Readings from Last 3 Encounters:  03/05/21 97 kg (213 lb 12.8 oz)  02/28/21 96.8 kg (213 lb 8 oz)  02/19/21 95.7  kg (211 lb)   BP 106/78   Pulse 94   Wt 97 kg (213 lb 12.8 oz)   SpO2 98%   BMI 30.68 kg/m   PHYSICAL EXAM: General:  NAD. No resp difficulty HEENT: Normal Neck: Supple. No JVD. Carotids 2+ bilat; no bruits. No lymphadenopathy or thryomegaly appreciated. Cor: PMI nondisplaced. Regular rate & rhythm. No rubs, gallops or murmurs. Lungs: Clear Abdomen: Soft, nontender, nondistended. No hepatosplenomegaly. No bruits or masses. Good bowel sounds. Extremities: No cyanosis, clubbing, rash, edema Neuro: Alert & oriented x 3, cranial nerves grossly intact. Moves all 4 extremities w/o difficulty. Flat affect.  ReDs: 42%  ASSESSMENT & PLAN: 1. Chronic Systolic Heart Failure, NICM  - Echo (5/21): EF 15% RV moderately reduced. Suspect ETOH and cocaine playing a role.  - LHC (5/21): normal cors. 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  - Echo (10/21): EF 30-35% RV normal. No effusion.  - Echo (2/22): EF 30%  - Stable NYHA II. Volume looks ok today on exam, but weight up some and ReDs 42%. - Increase Lasix to 40 mg daily. - He is off spiro, no BP room to add today. - Continue Entresto 97-103 mg bid.  - Continue Coreg 3.125 mg bid.  - Continue Farxiga 10 mg daily. - Continue Corlanor 7.5 mg bid. - Referred for genetic testing  for LVNC.  - Not a candidate for ICD or advanced therapies w/ polysubstance. - Labs today, repeat in 2 weeks.  2. H/o Syncope - 07/10/20. - Denies related to ETOH intoxication.   3. Pulmonary Emboli - CTA w/ small LLL PE 5/21. - LE Venous dopplers negative for DVT.  - Given small PE. Risk of ongoing AC likely outweighs benefit. - Off Xarelto.   4. ETOH Abuse - Heavy drinker for many years since the age of 75.  - 01/2020 had 28 day detox [facility] but started drinking again.  - Congratulated on 90 days sober.  5. H/O Cocaine Abuse - No recent use.  - UDS 10/08/20 negative.  6. Tobacco Abuse - Smoking 1/2 ppd. - Discussed cessation.  7. H/o GIB/ Gastritis w/ ABLA - h/o heavy ETOH abuse + chronic anticoagulation therapy w/ Xarelto - Off xarelto.  - Refused EGD 07/28/20.  Difficult to manage with ongoing addiction issues. He has been discharged from the [facility] program due to on-going non compliance. Seems to be doing better since rehab  Follow up in 1 month with APP (add low-dose spiro) and 3 months with Dr. Melynda Keller, FNP-BC  03/05/21 3:17 PM

## 2021-03-05 ENCOUNTER — Encounter (HOSPITAL_COMMUNITY): Payer: Self-pay

## 2021-03-05 ENCOUNTER — Other Ambulatory Visit: Payer: Self-pay

## 2021-03-05 ENCOUNTER — Ambulatory Visit (HOSPITAL_COMMUNITY)
Admission: RE | Admit: 2021-03-05 | Discharge: 2021-03-05 | Disposition: A | Discharge status: Home / Self Care | Payer: BC Managed Care – PPO | Source: Ambulatory Visit | Attending: Family Medicine | Admitting: Family Medicine

## 2021-03-05 VITALS — BP 106/78 | HR 94 | Wt 213.8 lb

## 2021-03-05 DIAGNOSIS — Z8249 Family history of ischemic heart disease and other diseases of the circulatory system: Secondary | ICD-10-CM | POA: Diagnosis not present

## 2021-03-05 DIAGNOSIS — F1010 Alcohol abuse, uncomplicated: Secondary | ICD-10-CM

## 2021-03-05 DIAGNOSIS — I2699 Other pulmonary embolism without acute cor pulmonale: Secondary | ICD-10-CM | POA: Diagnosis not present

## 2021-03-05 DIAGNOSIS — F1410 Cocaine abuse, uncomplicated: Secondary | ICD-10-CM

## 2021-03-05 DIAGNOSIS — F1411 Cocaine abuse, in remission: Secondary | ICD-10-CM | POA: Insufficient documentation

## 2021-03-05 DIAGNOSIS — F1021 Alcohol dependence, in remission: Secondary | ICD-10-CM | POA: Insufficient documentation

## 2021-03-05 DIAGNOSIS — I5022 Chronic systolic (congestive) heart failure: Secondary | ICD-10-CM | POA: Diagnosis not present

## 2021-03-05 DIAGNOSIS — Z7984 Long term (current) use of oral hypoglycemic drugs: Secondary | ICD-10-CM | POA: Diagnosis not present

## 2021-03-05 DIAGNOSIS — Z720 Tobacco use: Secondary | ICD-10-CM

## 2021-03-05 DIAGNOSIS — R55 Syncope and collapse: Secondary | ICD-10-CM | POA: Diagnosis not present

## 2021-03-05 DIAGNOSIS — I428 Other cardiomyopathies: Secondary | ICD-10-CM | POA: Insufficient documentation

## 2021-03-05 DIAGNOSIS — Z79899 Other long term (current) drug therapy: Secondary | ICD-10-CM | POA: Insufficient documentation

## 2021-03-05 DIAGNOSIS — F17210 Nicotine dependence, cigarettes, uncomplicated: Secondary | ICD-10-CM | POA: Diagnosis not present

## 2021-03-05 DIAGNOSIS — Z8719 Personal history of other diseases of the digestive system: Secondary | ICD-10-CM

## 2021-03-05 LAB — BASIC METABOLIC PANEL
Anion gap: 8 (ref 5–15)
BUN: 18 mg/dL (ref 6–20)
CO2: 27 mmol/L (ref 22–32)
Calcium: 9.4 mg/dL (ref 8.9–10.3)
Chloride: 102 mmol/L (ref 98–111)
Creatinine, Ser: 1.28 mg/dL — ABNORMAL HIGH (ref 0.61–1.24)
GFR, Estimated: >60 mL/min (ref >60)
Glucose, Bld: 94 mg/dL (ref 70–99)
Potassium: 4.4 mmol/L (ref 3.5–5.1)
Sodium: 137 mmol/L (ref 135–145)

## 2021-03-05 MED ORDER — FUROSEMIDE 20 MG PO TABS: 40.0000 mg | ORAL_TABLET | Freq: Every day | ORAL | 3 refills | Status: DC

## 2021-03-05 NOTE — Progress Notes (Signed)
ReDS Vest / Clip - 03/05/21 1500       ReDS Vest / Clip   Station Marker C    Ruler Value 27.5    ReDS Value Range High volume overload    ReDS Actual Value 42

## 2021-03-05 NOTE — Patient Instructions (Signed)
Labs were done today, if any labs are abnormal the clinic will call you  INCREASE Lasix to 40 mg 2 tablets daily  Your physician recommends that you schedule a follow-up appointment in: 1 month and then in 3 months  Your physician recommends that you return for lab work in:  2 weeks  At the [facility], you and your health needs are our priority. As part of our continuing mission to provide you with exceptional heart care, we have created designated Provider Care Teams. These Care Teams include your primary Cardiologist (physician) and Advanced Practice Providers (APPs- Physician Assistants and Nurse Practitioners) who all work together to provide you with the care you need, when you need it.   You may see any of the following providers on your designated Care Team at your next follow up: Dr Glori Bickers Dr Haynes Kerns, NP Lyda Jester, [place] [facility] [place], [place] Audry Riles, PharmD   Please be sure to bring in all your medications bottles to every appointment.    If you have any questions or concerns before your next appointment please send [place] a message through [facility] or call our office at [phone].    TO LEAVE A MESSAGE FOR THE NURSE SELECT OPTION 2, PLEASE LEAVE A MESSAGE INCLUDING: YOUR NAME DATE OF BIRTH CALL BACK NUMBER REASON FOR CALL**this is important as we prioritize the call backs  YOU WILL RECEIVE A CALL BACK THE SAME DAY AS LONG AS YOU CALL BEFORE 4:00 PM

## 2021-03-07 ENCOUNTER — Other Ambulatory Visit: Payer: Self-pay

## 2021-03-07 ENCOUNTER — Ambulatory Visit (HOSPITAL_COMMUNITY)
Admission: RE | Admit: 2021-03-07 | Discharge: 2021-03-07 | Disposition: A | Discharge status: Home / Self Care | Payer: BC Managed Care – PPO | Source: Ambulatory Visit | Attending: Internal Medicine | Admitting: Internal Medicine

## 2021-03-07 DIAGNOSIS — D508 Other iron deficiency anemias: Secondary | ICD-10-CM | POA: Diagnosis not present

## 2021-03-07 DIAGNOSIS — I5022 Chronic systolic (congestive) heart failure: Secondary | ICD-10-CM | POA: Diagnosis not present

## 2021-03-07 MED ORDER — SODIUM CHLORIDE 0.9 % IV SOLN
250.0000 mg | INTRAVENOUS | Status: DC
  Administered 2021-03-07: 250 mg via INTRAVENOUS
  Filled 2021-03-07: qty 20

## 2021-03-12 ENCOUNTER — Other Ambulatory Visit: Payer: BC Managed Care – PPO

## 2021-03-12 ENCOUNTER — Encounter: Payer: Self-pay | Admitting: Gastroenterology

## 2021-03-12 ENCOUNTER — Ambulatory Visit (INDEPENDENT_AMBULATORY_CARE_PROVIDER_SITE_OTHER): Payer: BC Managed Care – PPO | Admitting: Gastroenterology

## 2021-03-12 VITALS — BP 110/70 | HR 67 | Ht 70.0 in | Wt 208.0 lb

## 2021-03-12 DIAGNOSIS — K921 Melena: Secondary | ICD-10-CM

## 2021-03-12 DIAGNOSIS — R140 Abdominal distension (gaseous): Secondary | ICD-10-CM

## 2021-03-12 DIAGNOSIS — D509 Iron deficiency anemia, unspecified: Secondary | ICD-10-CM

## 2021-03-12 DIAGNOSIS — K2950 Unspecified chronic gastritis without bleeding: Secondary | ICD-10-CM | POA: Diagnosis not present

## 2021-03-12 MED ORDER — PANTOPRAZOLE SODIUM 40 MG PO TBEC: 40.0000 mg | DELAYED_RELEASE_TABLET | Freq: Two times a day (BID) | ORAL | 3 refills | Status: DC

## 2021-03-12 NOTE — Patient Instructions (Signed)
It was my pleasure to provide care to you today. Based on our discussion, I am providing you with my recommendations below:  RECOMMENDATION(S):   Increase Pantoprazole to 40 mg twice daily 30-60 minutes before breakfast and dinner.  Avoid NSAIDs.   LABS:   Please proceed to the basement level for lab work before leaving today. Press "B" on the elevator. The lab is located at the first door on the left as you exit the elevator.  COLONOSCOPY/ENDOSCOPY:   You have been scheduled for a colonoscopy/endoscopy. Please follow written instructions given to you at your visit today.   PREP:   Please pick up your prep supplies at the pharmacy within the next 1-3 days.  INHALERS:   If you use inhalers (even only as needed), please bring them with you on the day of your procedure.  COLONOSCOPY TIPS:  To reduce nausea and dehydration, stay well hydrated for 3-4 days prior to the exam.  To prevent skin/hemorrhoid irritation - prior to wiping, put A&Dointment or vaseline on the toilet paper. Keep a towel or pad on the bed.  BEFORE STARTING YOUR PREP, drink  64oz of clear liquids in the morning. This will help to flush the colon and will ensure you are well hydrated!!!!  NOTE - This is in addition to the fluids required for to complete your prep. Use of a flavored hard candy, such as grape Anise Salvo, can counteract some of the flavor of the prep and may prevent some nausea.     FOLLOW UP:  After your procedure, you will receive a call from my office staff regarding my recommendation for follow up.  BMI:  If you are age 33 or older, your body mass index should be between 23-30. Your Body mass index is 29.84 kg/m. If this is out of the aforementioned range listed, please consider follow up with your Primary Care Provider.  If you are age 15 or younger, your body mass index should be between 19-25. Your Body mass index is 29.84 kg/m. If this is out of the aformentioned range listed, please  consider follow up with your Primary Care Provider.   MY CHART:  The [facility] providers would like to encourage you to use [facility] to communicate with providers for non-urgent requests or questions.  Due to long hold times on the telephone, sending your provider a message by [facility] may be a faster and more efficient way to get a response.  Please allow 48 business hours for a response.  Please remember that this is for non-urgent requests.   Thank you for trusting me with your gastrointestinal care!    Thornton Park, MD, MPH

## 2021-03-12 NOTE — Progress Notes (Signed)
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 male who returns in follow-up.  He was seen in consultation by Carl Best 01/12/21. He returns today in follow-up. He has asthma, biventricular heart failure, nonischemic cardiomyopathy, prolonged QTc interval, PE 09/2019, polysubstance abuse (alcohol, cocaine, marijuana and tobacco), and a history of severe alcohol withdrawal/delirium tremens which required numerous hospital admissions. He was admitted to the hospital ICU 5/29 - 10/13/2020 with severe sepsis secondary to aspiration pneumonia treated with IV antibiotics with severe alcohol withdrawal/delirium tremens. He has continued to have multiple, frequent ED visits.  He has recently completed [facility]. Echocardiogram 07/10/20 showed an EF of 30%.  Obtaining recent symptoms is challenging.  Last seen for rectal bleeding noted on the stool and on the toilet paper. There is associated straining. No recent bleeding. Intermittent rectal pain. He started using the Anusol HC suppository last week.   Reports ongoing abdominal pain, bloating, flatus.   Has been off pantoprazole over the last week, which is temporarally associated with his GI symptoms.   Has been started on iron supplements for iron deficiency and this is worsening his constipation.   Most recent labs: - 07/28/20: Fecal occult blood + - 11/21/20: hemoglobin 11, MCV 91, RDW 14, platelets 227 - 01/12/21: iron 54, ferritin 23, % saturation 13, hemoglobin 12, platelets 266 - 03/05/21: normal BMP except for creatinine 1.28  Prior endoscopic evaluation: - EGD with Dr. Penelope Coop 11/02/19: normal esophagus, erythematous mucosa in the stomach, erythematous duodenopathy. No specimens collected.  Prior abdominal imaging: - CT abd/pelvis 10/08/20: Hepatic steatosis, no CT cause of abdominal pain  Past Medical History:  Diagnosis Date   AKI (acute kidney injury) ([place]) 10/30/2019   Alcohol use    Alcohol withdrawal ([place]) 03/06/2020   Anxiety    Asthma    CHF (congestive heart failure) (HCC)    Depression    GERD (gastroesophageal reflux disease)    Pulmonary embolism (HCC)    Seizure ([place])     Past Surgical History:  Procedure Laterality Date   ESOPHAGOGASTRODUODENOSCOPY (EGD) WITH PROPOFOL N/A 11/01/2019   Procedure: ESOPHAGOGASTRODUODENOSCOPY (EGD) WITH PROPOFOL;  Surgeon: [name], MD;  [facility] ENDOSCOPY;  Service: Endoscopy;  Laterality: N/A;   RIGHT/LEFT HEART CATH AND CORONARY ANGIOGRAPHY N/A 10/06/2019   Procedure: RIGHT/LEFT HEART CATH AND CORONARY ANGIOGRAPHY;  Surgeon: [name], MD;  Location: [place] CV LAB;  Service: Cardiovascular;  Laterality: N/A;   WRIST SURGERY Right    age17     Current Outpatient Medications  Medication Sig Dispense Refill   carvedilol (COREG) 3.125 MG tablet Take 3.125 mg by mouth 2 (  two) times daily with a meal.     dapagliflozin propanediol (FARXIGA) 10 MG TABS tablet Take 1 tablet (10 mg total) by mouth daily before breakfast. 30 tablet 11   folic acid (FOLVITE) 1 MG tablet Take 1 tablet (1 mg total) by mouth daily.  30 tablet 3   furosemide (LASIX) 20 MG tablet Take 2 tablets (40 mg total) by mouth daily. 60 tablet 3   ibuprofen (ADVIL) 400 MG tablet Take 400 mg by mouth 3 (three) times daily as needed for moderate pain.     ivabradine (CORLANOR) 7.5 MG TABS tablet Take 1 tablet (7.5 mg total) by mouth 2 (two) times daily with a meal. 60 tablet 11   magnesium oxide (MAG-OX) 400 MG tablet Take 400 mg by mouth daily. As needed     nicotine (NICODERM CQ - DOSED IN MG/24 HOURS) 21 mg/24hr patch Place 21 mg onto the skin daily. As needed     pantoprazole (PROTONIX) 40 MG tablet Take 1 tablet (40 mg total) by mouth 2 (two) times daily before a meal. 60 tablet 3   penicillin v potassium (VEETID) 500 MG tablet Take 500 mg by mouth 4 (four) times daily.     sacubitril-valsartan (ENTRESTO) 97-103 MG Take 1 tablet by mouth 2 (two) times daily. 60 tablet 3   No current facility-administered medications for this visit.    Allergies as of 03/12/2021 - Review Complete 03/12/2021  Allergen Reaction Noted   Bactrim [sulfamethoxazole-trimethoprim] Rash 10/30/2019    Family History  Problem Relation Age of Onset   Heart failure Father    Diabetes Father    Kidney disease Father    Heart disease Father 47   Heart disease Maternal Grandfather 73   Heart attack Maternal Grandfather    Heart disease Paternal Grandfather 27   Lung cancer Maternal Aunt      Physical Exam: General:   Alert,  well-nourished, pleasant and cooperative in NAD Head:  Normocephalic and atraumatic. Eyes:  Sclera clear, no icterus.   Conjunctiva pink. Abdomen:  Soft, nontender, nondistended, normal bowel sounds, no rebound or guarding. No hepatosplenomegaly.   Neurologic:  Alert and  oriented x4;  grossly nonfocal Skin:  Intact without significant lesions or rashes. Psych:  Alert and cooperative. Normal mood and affect.   Olimpia Tinch L. Tarri Glenn, MD, MPH 03/12/2021, 10:22 PM

## 2021-03-13 LAB — IRON,TIBC AND FERRITIN PANEL
%SAT: 21 % (calc) (ref 20–48)
Ferritin: 210 ng/mL (ref 38–380)
Iron: 90 ug/dL (ref 50–180)
TIBC: 422 mcg/dL (calc) (ref 250–425)

## 2021-03-19 ENCOUNTER — Ambulatory Visit (HOSPITAL_COMMUNITY)
Admission: RE | Admit: 2021-03-19 | Discharge: 2021-03-19 | Disposition: A | Discharge status: Home / Self Care | Payer: BC Managed Care – PPO | Source: Ambulatory Visit | Attending: Internal Medicine | Admitting: Internal Medicine

## 2021-03-19 ENCOUNTER — Other Ambulatory Visit: Payer: Self-pay

## 2021-03-19 DIAGNOSIS — I5022 Chronic systolic (congestive) heart failure: Secondary | ICD-10-CM

## 2021-03-19 LAB — BASIC METABOLIC PANEL
Anion gap: 9 (ref 5–15)
BUN: 18 mg/dL (ref 6–20)
CO2: 25 mmol/L (ref 22–32)
Calcium: 9.2 mg/dL (ref 8.9–10.3)
Chloride: 102 mmol/L (ref 98–111)
Creatinine, Ser: 1.09 mg/dL (ref 0.61–1.24)
GFR, Estimated: >60 mL/min (ref >60)
Glucose, Bld: 106 mg/dL — ABNORMAL HIGH (ref 70–99)
Potassium: 4.1 mmol/L (ref 3.5–5.1)
Sodium: 136 mmol/L (ref 135–145)

## 2021-03-20 ENCOUNTER — Telehealth: Payer: Self-pay | Admitting: *Deleted

## 2021-03-20 NOTE — Telephone Encounter (Signed)
-----   Message from Aleatha Borer, LPN sent at 86/05/6835  7:32 AM EDT ----- I believe this was something you scheduled in clinic. Please see Dr. Tarri Glenn message below ----- Message ----- From: Thornton Park, MD Sent: 03/12/2021  10:47 PM EDT To: Aleatha Borer, LPN  Patient seen in the office. Procedures were scheduled in the [place] (I think) but they need to be scheduled at the hospital. Would you please change the location? Thank you.  KLB

## 2021-03-20 NOTE — Addendum Note (Signed)
Addended by: Horris Latino on: 03/20/2021 03:11 PM   Modules accepted: Orders, SmartSet

## 2021-03-20 NOTE — Telephone Encounter (Signed)
Spoke with patient and informed him of change from [facility] to the hospital. Patient voiced understanding. Scheduled patient at [facility] on 05/31/21 @ 10:30 am. Will send new instructions via mychart.

## 2021-03-28 ENCOUNTER — Other Ambulatory Visit: Payer: Self-pay

## 2021-03-29 ENCOUNTER — Ambulatory Visit: Payer: BC Managed Care – PPO | Admitting: Nurse Practitioner

## 2021-04-02 ENCOUNTER — Telehealth: Payer: Self-pay | Admitting: *Deleted

## 2021-04-02 NOTE — Telephone Encounter (Signed)
PA submitted for Pantoprazole twice daily via Covermymeds

## 2021-04-03 NOTE — Telephone Encounter (Addendum)
PA approved and faxed approval to pharmacy.

## 2021-04-03 NOTE — Progress Notes (Addendum)
ADVANCED HF CLINIC NOTE  PCP: [name] NP Primary HF Cardiologist: Dr Haroldine Laws    Reason for F/u: Heart Failure    HPI: Mr.Neuhaus is a 40 y.o. male with h/o heavy ETOH and cocaine use, PE,  biventricular systolic heart failure and tobacco abuse.   Admitted with acute onset biventricular HF in 5/21. EF 15%. RV down. He was drinking at least 2 beers every night and about 12-24 over the weekend. Also drinking 1/5 of liqour every other day. Cath showed normal coronaries. Elevated filling pressures with normal output. Placed on milrinone and diuresed with IV lasix. As he improved milrinone was weaned off . Transitioned to oral lasix. 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 CT showed small LLL PE. Placed on xarelto. Started on HF meds.  Discharge weight 178 pounds.   Readmitted 6/21 for mild acute blood loss related anemia. EGD showed no active bleeding but evidence of some gastritis.  He was switched to oral PPI twice daily, H&H remained stable and he did not require any transfusions.  He was instructed to continue Xarelto.   Continues to have very frequent ED visits. Admitted 10/25-10/28/21 for ETOH withdrawal symptoms and AKI. Admitted 03/23/2020 with AMS in the setting ETOH abuse. Hypotensive, AKI, and with lactic acidosis. Entresto, spiro, and lasix. All restarted prior to discharge. Returned to the ED on 03/26/20 with nausea/vomiting.   Echo 10/21 EF 30-35% mild RV HK.   Admitted 07/10/20-07/12/20 with seizure in the setting of ETOH withdrawal.   Admitted 07/29/20-07/31/20 with n/v abdominal pain. Had AKI. GI consulted and recommended EGD. He felt better and refused EGD. Plan to continue PPI + carafate. Xarelto and zoloft stopped. Discharged earlier today from the hospital.   Evaluated in the ED 08/19/20 with AKI, 09/01/20 with ETOH withdrawl, and 09/11/20 with atypical chest pain.   Admitted 10/08/20 for CP, SOB, N/V. Blood ethanol level 180. CXR concerning for PNA, possible infected  pneumatocele. Treated with IV abx. Hospitalization c/b AKI and acute ETOH withdrawal, requiring ICU care. Seroquel and sertraline stopped at discharge.  Presented to [facility] 7/8 with diarrhea, N/V. Last ETOH was 2-3 days prior. He was admitted and found to have AKI, likely due to volume depletion from diarrhea and vomiting. SCr peaked at 3.2. Entresto and spiro held, restarted at discharge. SCr down to 1.25 at discharge after gentle IV hydration. Weight 194 lbs.  Today he returns for HF follow up. Overall feeling fine. He is not SOB walking on flat ground. Going to the gym and lifting light weights 5 days a week with his brother. Denies palpitations, CP, dizziness, edema, or PND/Orthopnea. Appetite ok. No fever or chills. Weight at home 205 pounds. Taking all medications. Smoking 3 cigs/day. 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 Remains off ETOH.  Cardiac studies. Echo (5/21): EF <20%  Prominent trabeculation. Grade III DD  R/LHC (5/21): showed normal coronaries, elevated filling pressures with normal output.  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 No tamponade physiology.  Echo (10/21): EF 30-35% RV normal. No effusion.  Echo (2/22): EF 30%   ROS: All systems negative except as listed in HPI, PMH and Problem List.  SH:  Social History   Socioeconomic History   Marital status: Single    Spouse name: Not on file   Number of children: Not on file   Years of education: Not on  file   Highest education level: Not on file  Occupational History   Occupation: Ecologist  Tobacco Use   Smoking status: Every Day    Packs/day: 0.25    Years: 24.00    Pack years: 6.00    Types: Cigarettes   Smokeless tobacco: Never  Vaping Use   Vaping Use: Former  Substance and Sexual Activity   Alcohol use: Yes    Alcohol/week: 8.0  standard drinks    Types: 8 Shots of liquor per week    Comment: heavy use since age 43   Drug use: Yes    Frequency: 1.0 times per week    Types: Marijuana   Sexual activity: Yes  Other Topics Concern   Not on file  Social History Narrative   Not on file   Social Determinants of Health   Financial Resource Strain: Not on file  Food Insecurity: Not on file  Transportation Needs: Not on file  Physical Activity: Not on file  Stress: Not on file  Social Connections: Not on file  Intimate Partner Violence: Not on file   FH:  Family History  Problem Relation Age of Onset   Heart failure Father    Diabetes Father    Kidney disease Father    Heart disease Father 57   Heart disease Maternal Grandfather 84   Heart attack Maternal Grandfather    Heart disease Paternal Grandfather 34   Lung cancer Maternal Aunt    Past Medical History:  Diagnosis Date   AKI (acute kidney injury) ([place]) 10/30/2019   Alcohol use    Alcohol withdrawal ([place]) 03/06/2020   Anxiety    Asthma    CHF (congestive heart failure) (HCC)    Depression    GERD (gastroesophageal reflux disease)    Pulmonary embolism (HCC)    Seizure (HCC)    Current Outpatient Medications  Medication Sig Dispense Refill   carvedilol (COREG) 3.125 MG tablet Take 3.125 mg by mouth 2 (two) times daily with a meal.     dapagliflozin propanediol (FARXIGA) 10 MG TABS tablet Take 1 tablet (10 mg total) by mouth daily before breakfast. 30 tablet 11   folic acid (FOLVITE) 1 MG tablet Take 1 tablet (1 mg total) by mouth daily. 30 tablet 3   furosemide (LASIX) 20 MG tablet Take 2 tablets (40 mg total) by mouth daily. 60 tablet 3   ibuprofen (ADVIL) 400 MG tablet Take 400 mg by mouth 3 (three) times daily as needed for moderate pain.     ivabradine (CORLANOR) 7.5 MG TABS tablet Take 1 tablet (7.5 mg total) by mouth 2 (two) times daily with a meal. 60 tablet 11   magnesium oxide (MAG-OX) 400 MG tablet Take 400 mg by mouth daily. As  needed     nicotine (NICODERM CQ - DOSED IN MG/24 HOURS) 21 mg/24hr patch Place 21 mg onto the skin daily. As needed     pantoprazole (PROTONIX) 40 MG tablet Take 1 tablet (40 mg total) by mouth 2 (two) times daily before a meal. 60 tablet 3   sacubitril-valsartan (ENTRESTO) 97-103 MG Take 1 tablet by mouth 2 (two) times daily. 60 tablet 3   No current facility-administered medications for this encounter.   Wt Readings from Last 3 Encounters:  04/09/21 92.8 kg (204 lb 9.6 oz)  03/12/21 94.3 kg (208 lb)  03/07/21 96.6 kg (213 lb)   BP 108/78   Pulse 74   Wt 92.8 kg (204 lb 9.6 oz)  SpO2 100%   BMI 29.36 kg/m   PHYSICAL EXAM: General:  NAD. No resp difficulty HEENT: Normal Neck: Supple. No JVD. Carotids 2+ bilat; no bruits. No lymphadenopathy or thryomegaly appreciated. Cor: PMI nondisplaced. Regular rate & rhythm. No rubs, gallops or murmurs. Lungs: Clear Abdomen: Soft, nontender, nondistended. No hepatosplenomegaly. No bruits or masses. Good bowel sounds. Extremities: No cyanosis, clubbing, rash, edema Neuro: Alert & oriented x 3, cranial nerves grossly intact. Moves all 4 extremities w/o difficulty. Affect pleasant.  ECG: SR 72 bpm (personally reviewed).  ASSESSMENT & PLAN: 1. Chronic Systolic Heart Failure, NICM  - Echo (5/21): EF 15% RV moderately reduced. Suspect ETOH and cocaine playing a role.  - LHC (5/21): normal cors. 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  - Echo (10/21): EF 30-35% RV normal. No effusion.  - Echo (2/22): EF 30%  - Stable NYHA II. Volume looks good today. - Restart spiro 12.5 mg daily. - Decrease Lasix to 20 mg daily. - Continue Entresto 97-103 mg bid.  - Continue Coreg 3.125 mg bid.  - Continue Farxiga 10 mg daily. - Continue Corlanor 7.5 mg bid. - Referred for genetic testing for LVNC.  - Not a candidate for ICD or advanced therapies w/ polysubstance. - Update echo. -  BMET today, repeat in 10 days and 4 weeks.  2. H/o Syncope - 07/10/20. - Denies related to ETOH intoxication.   3. Pulmonary Emboli - CTA w/ small LLL PE 5/21. - LE Venous dopplers negative for DVT.  - Given small PE. Risk of ongoing AC likely outweighs benefit. - Off Xarelto.   4. ETOH Abuse - Heavy drinker for many years since the age of 77.  - 01/2020 had 28 day detox [facility] but started drinking again.  - Now, 4 months sober.  5. H/O Cocaine Abuse - No recent use.  - UDS 10/08/20 negative.  6. Tobacco Abuse - Down to 3 cigarettes/day. - Discussed cessation.  7. H/o GIB/ Gastritis w/ ABLA - h/o heavy ETOH abuse + chronic anticoagulation therapy w/ Xarelto - Off xarelto.  - Planning for EGD & colonoscopy in-hospital 1/23.  Seems to be doing better with medication compliance since rehab. Discussed cardiac clearance for DOT with Dr. Haroldine Laws. 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 Will arrange echo and forward recommendations to provider.  Follow up with Dr. Haroldine Laws in 2 months as scheduled.  Rafael Bihari, FNP-BC  04/09/21 10:47 AM

## 2021-04-04 ENCOUNTER — Telehealth (HOSPITAL_COMMUNITY): Payer: Self-pay | Admitting: Pharmacy Technician

## 2021-04-04 NOTE — Telephone Encounter (Signed)
Patient Advocate Encounter   Received notification from [place] that prior authorization for Corlanor is required.   PA submitted on CoverMyMeds Key  [id number]  Status is pending   Will continue to follow.

## 2021-04-09 ENCOUNTER — Encounter (HOSPITAL_COMMUNITY): Payer: Self-pay

## 2021-04-09 ENCOUNTER — Other Ambulatory Visit: Payer: Self-pay

## 2021-04-09 ENCOUNTER — Other Ambulatory Visit (HOSPITAL_COMMUNITY): Payer: Self-pay

## 2021-04-09 ENCOUNTER — Ambulatory Visit (HOSPITAL_COMMUNITY)
Admission: RE | Admit: 2021-04-09 | Discharge: 2021-04-09 | Disposition: A | Discharge status: Home / Self Care | Payer: BC Managed Care – PPO | Source: Ambulatory Visit | Attending: Family Medicine | Admitting: Family Medicine

## 2021-04-09 VITALS — BP 108/78 | HR 74 | Wt 204.6 lb

## 2021-04-09 DIAGNOSIS — I428 Other cardiomyopathies: Secondary | ICD-10-CM | POA: Diagnosis not present

## 2021-04-09 DIAGNOSIS — F1011 Alcohol abuse, in remission: Secondary | ICD-10-CM | POA: Diagnosis not present

## 2021-04-09 DIAGNOSIS — I5022 Chronic systolic (congestive) heart failure: Secondary | ICD-10-CM | POA: Insufficient documentation

## 2021-04-09 DIAGNOSIS — Z8719 Personal history of other diseases of the digestive system: Secondary | ICD-10-CM | POA: Insufficient documentation

## 2021-04-09 DIAGNOSIS — F1411 Cocaine abuse, in remission: Secondary | ICD-10-CM | POA: Insufficient documentation

## 2021-04-09 DIAGNOSIS — F17210 Nicotine dependence, cigarettes, uncomplicated: Secondary | ICD-10-CM | POA: Diagnosis not present

## 2021-04-09 DIAGNOSIS — I5082 Biventricular heart failure: Secondary | ICD-10-CM | POA: Diagnosis not present

## 2021-04-09 DIAGNOSIS — Z7901 Long term (current) use of anticoagulants: Secondary | ICD-10-CM | POA: Insufficient documentation

## 2021-04-09 DIAGNOSIS — Z86711 Personal history of pulmonary embolism: Secondary | ICD-10-CM | POA: Insufficient documentation

## 2021-04-09 LAB — BASIC METABOLIC PANEL
Anion gap: 7 (ref 5–15)
BUN: 20 mg/dL (ref 6–20)
CO2: 24 mmol/L (ref 22–32)
Calcium: 9.5 mg/dL (ref 8.9–10.3)
Chloride: 104 mmol/L (ref 98–111)
Creatinine, Ser: 1.06 mg/dL (ref 0.61–1.24)
GFR, Estimated: >60 mL/min (ref >60)
Glucose, Bld: 96 mg/dL (ref 70–99)
Potassium: 4.4 mmol/L (ref 3.5–5.1)
Sodium: 135 mmol/L (ref 135–145)

## 2021-04-09 LAB — HEMOGLOBIN A1C
Hgb A1c MFr Bld: 5.7 % — ABNORMAL HIGH (ref 4.8–5.6)
Mean Plasma Glucose: 117 mg/dL

## 2021-04-09 MED ORDER — FUROSEMIDE 20 MG PO TABS: 20.0000 mg | ORAL_TABLET | Freq: Every day | ORAL | 3 refills | Status: DC

## 2021-04-09 MED ORDER — SPIRONOLACTONE 25 MG PO TABS: 12.5000 mg | ORAL_TABLET | Freq: Every day | ORAL | 0 refills | Status: DC

## 2021-04-09 NOTE — Telephone Encounter (Signed)
Advanced Heart Failure Patient Advocate Encounter  Prior Authorization for Corlanor has been approved.    PA# [id number] Effective dates: 04/04/21 through 04/04/22  Cannot see patient's co-pay. Insurance requires patient to fill medications at [facility] or [facility]. Otherwise patient will have to pay full cost of medication. Patient has co-pay card.  Charlann Boxer, CPhT

## 2021-04-09 NOTE — Patient Instructions (Addendum)
Thank You for coming in today  Labs were done today if any labs are abnormal the clinic will call you  EKG was done today  START Spirolactone 12.5 mg 1/2 tablet daily   DECREASE Lasix to 20 mg 1 tablet daily   Your physician recommends that you schedule a follow-up appointment in: keep follow up appointment with Dr. Haroldine Laws  At the [facility], you and your health needs are our priority. As part of our continuing mission to provide you with exceptional heart care, we have created designated Provider Care Teams. These Care Teams include your primary Cardiologist (physician) and Advanced Practice Providers (APPs- Physician Assistants and Nurse Practitioners) who all work together to provide you with the care you need, when you need it.   You may see any of the following providers on your designated Care Team at your next follow up: Dr Glori Bickers Dr Haynes Kerns, NP Lyda Jester, [place] [facility] [place], [place] Audry Riles, PharmD   Please be sure to bring in all your medications bottles to every appointment.   If you have any questions or concerns before your next appointment please send [place] a message through [place] or call our office at [phone].    TO LEAVE A MESSAGE FOR THE NURSE SELECT OPTION 2, PLEASE LEAVE A MESSAGE INCLUDING: YOUR NAME DATE OF BIRTH CALL BACK NUMBER REASON FOR CALL**this is important as we prioritize the call backs  YOU WILL RECEIVE A CALL BACK THE SAME DAY AS LONG AS YOU CALL BEFORE 4:00 PM

## 2021-04-10 ENCOUNTER — Telehealth (HOSPITAL_COMMUNITY): Payer: BC Managed Care – PPO | Admitting: Family Medicine

## 2021-04-10 ENCOUNTER — Other Ambulatory Visit (HOSPITAL_COMMUNITY): Payer: Self-pay | Admitting: *Deleted

## 2021-04-10 DIAGNOSIS — I5022 Chronic systolic (congestive) heart failure: Secondary | ICD-10-CM

## 2021-04-10 NOTE — Telephone Encounter (Signed)
Called to discuss repeating echo to quantify EF for DOT physical. 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 Patient tells me he is going back to his previous job at [employer] and does not need clearance at this time, but may in the future. He would like to schedule echo. Will arrange.  Today she returns for HF follow up. Overall feeling fine. Denies increasing SOB, CP, dizziness, edema, or PND/Orthopnea. Appetite ok. No fever or chills. Weight at home 170 pounds. Taking all medications.

## 2021-04-10 NOTE — Addendum Note (Signed)
Encounter addended by: Rafael Bihari, FNP on: 04/10/2021 2:36 PM  Actions taken: Clinical Note Signed

## 2021-04-16 ENCOUNTER — Encounter: Payer: BC Managed Care – PPO | Admitting: Gastroenterology

## 2021-04-20 ENCOUNTER — Other Ambulatory Visit (HOSPITAL_COMMUNITY): Payer: BC Managed Care – PPO

## 2021-04-26 ENCOUNTER — Other Ambulatory Visit: Payer: Self-pay

## 2021-04-26 ENCOUNTER — Ambulatory Visit (HOSPITAL_COMMUNITY)
Admission: RE | Admit: 2021-04-26 | Discharge: 2021-04-26 | Disposition: A | Discharge status: Home / Self Care | Payer: BC Managed Care – PPO | Source: Ambulatory Visit | Attending: Cardiology | Admitting: Cardiology

## 2021-04-26 DIAGNOSIS — I5022 Chronic systolic (congestive) heart failure: Secondary | ICD-10-CM | POA: Diagnosis not present

## 2021-04-26 LAB — BASIC METABOLIC PANEL
Anion gap: 8 (ref 5–15)
BUN: 21 mg/dL — ABNORMAL HIGH (ref 6–20)
CO2: 25 mmol/L (ref 22–32)
Calcium: 9.2 mg/dL (ref 8.9–10.3)
Chloride: 104 mmol/L (ref 98–111)
Creatinine, Ser: 1.15 mg/dL (ref 0.61–1.24)
GFR, Estimated: >60 mL/min (ref >60)
Glucose, Bld: 89 mg/dL (ref 70–99)
Potassium: 4.2 mmol/L (ref 3.5–5.1)
Sodium: 137 mmol/L (ref 135–145)

## 2021-04-27 ENCOUNTER — Other Ambulatory Visit (HOSPITAL_COMMUNITY): Payer: Self-pay | Admitting: Adult Health

## 2021-05-08 ENCOUNTER — Ambulatory Visit (HOSPITAL_COMMUNITY)
Admission: RE | Admit: 2021-05-08 | Discharge: 2021-05-08 | Disposition: A | Discharge status: Home / Self Care | Payer: BC Managed Care – PPO | Source: Ambulatory Visit | Attending: Family Medicine | Admitting: Family Medicine

## 2021-05-08 ENCOUNTER — Other Ambulatory Visit: Payer: Self-pay

## 2021-05-08 DIAGNOSIS — I5022 Chronic systolic (congestive) heart failure: Secondary | ICD-10-CM | POA: Diagnosis not present

## 2021-05-08 LAB — BASIC METABOLIC PANEL
Anion gap: 6 (ref 5–15)
BUN: 19 mg/dL (ref 6–20)
CO2: 22 mmol/L (ref 22–32)
Calcium: 9.1 mg/dL (ref 8.9–10.3)
Chloride: 108 mmol/L (ref 98–111)
Creatinine, Ser: 0.94 mg/dL (ref 0.61–1.24)
GFR, Estimated: >60 mL/min (ref >60)
Glucose, Bld: 103 mg/dL — ABNORMAL HIGH (ref 70–99)
Potassium: 4.4 mmol/L (ref 3.5–5.1)
Sodium: 136 mmol/L (ref 135–145)

## 2021-05-22 ENCOUNTER — Encounter: Payer: Self-pay | Admitting: *Deleted

## 2021-05-22 ENCOUNTER — Encounter (HOSPITAL_COMMUNITY): Payer: Self-pay | Admitting: Gastroenterology

## 2021-05-30 NOTE — Anesthesia Preprocedure Evaluation (Addendum)
Anesthesia Evaluation  Patient identified by MRN, date of birth, ID band Patient awake    Reviewed: Allergy & Precautions, NPO status , Patient's Chart, lab work & pertinent test results  History of Anesthesia Complications Negative for: history of anesthetic complications  Airway Mallampati: II  TM Distance: >3 FB Neck ROM: Full    Dental  (+) Poor Dentition, Dental Advisory Given, Loose   Pulmonary asthma , Current Smoker and Patient abstained from smoking., PE   Pulmonary exam normal        Cardiovascular +CHF  Normal cardiovascular exam  1. 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 LVEF is severely depressed. Compared to echo from October 2021, no significant change. Global longitudinal strain is -10.9%. Left ventricular ejection fraction, by estimation, is 30%%. The left ventricle has severely decreased function. The left ventricle demonstrates global hypokinesis. The left ventricular internal cavity size was severely dilated. Left ventricular diastolic parameters are consistent with Grade I diastolic dysfunction (impaired relaxation). 2. Right ventricular systolic function is normal. The right ventricular size is normal. 3. The mitral valve is normal in structure. No evidence of mitral valve regurgitation. 4. The aortic valve is abnormal. Aortic valve regurgitation is not visualized. Mild aortic valve sclerosis is present, with no evidence of aortic valve stenosis   Neuro/Psych Seizures -,  Anxiety Depression Sz due to alcohol w/d    GI/Hepatic GERD  ,(+)     substance abuse  alcohol use,   Endo/Other  negative endocrine ROS  Renal/GU negative Renal ROS     Musculoskeletal negative musculoskeletal ROS (+)   Abdominal   Peds  Hematology negative hematology ROS (+)   Anesthesia Other Findings   Reproductive/Obstetrics                             Anesthesia Physical Anesthesia Plan  ASA: 3  Anesthesia Plan: MAC   Post-op Pain Management: Minimal or no pain anticipated   Induction:   PONV Risk Score and Plan: Ondansetron and Propofol infusion  Airway Management Planned: Natural Airway  Additional Equipment:   Intra-op Plan:   Post-operative Plan:   Informed Consent: I have reviewed the patients History and Physical, chart, labs and discussed the procedure including the risks, benefits and alternatives for the proposed anesthesia with the patient or authorized representative who has indicated his/her understanding and acceptance.     Dental advisory given  Plan Discussed with: CRNA and Anesthesiologist  Anesthesia Plan Comments:         Anesthesia Quick Evaluation

## 2021-05-31 ENCOUNTER — Ambulatory Visit (HOSPITAL_COMMUNITY)
Admission: RE | Admit: 2021-05-31 | Discharge: 2021-05-31 | Disposition: A | Discharge status: Home / Self Care | Payer: BC Managed Care – PPO | Source: Ambulatory Visit | Attending: Gastroenterology | Admitting: Gastroenterology

## 2021-05-31 ENCOUNTER — Encounter (HOSPITAL_COMMUNITY)
Admission: RE | Disposition: A | Discharge status: Home / Self Care | Payer: Self-pay | Source: Ambulatory Visit | Attending: Gastroenterology

## 2021-05-31 ENCOUNTER — Ambulatory Visit (HOSPITAL_COMMUNITY): Payer: BC Managed Care – PPO | Admitting: Anesthesiology

## 2021-05-31 ENCOUNTER — Encounter (HOSPITAL_COMMUNITY): Payer: Self-pay | Admitting: Gastroenterology

## 2021-05-31 ENCOUNTER — Other Ambulatory Visit: Payer: Self-pay

## 2021-05-31 DIAGNOSIS — D120 Benign neoplasm of cecum: Secondary | ICD-10-CM | POA: Diagnosis not present

## 2021-05-31 DIAGNOSIS — K635 Polyp of colon: Secondary | ICD-10-CM | POA: Diagnosis not present

## 2021-05-31 DIAGNOSIS — I5082 Biventricular heart failure: Secondary | ICD-10-CM | POA: Diagnosis not present

## 2021-05-31 DIAGNOSIS — F10239 Alcohol dependence with withdrawal, unspecified: Secondary | ICD-10-CM | POA: Diagnosis not present

## 2021-05-31 DIAGNOSIS — D509 Iron deficiency anemia, unspecified: Secondary | ICD-10-CM | POA: Insufficient documentation

## 2021-05-31 DIAGNOSIS — K3189 Other diseases of stomach and duodenum: Secondary | ICD-10-CM | POA: Insufficient documentation

## 2021-05-31 DIAGNOSIS — D122 Benign neoplasm of ascending colon: Secondary | ICD-10-CM

## 2021-05-31 DIAGNOSIS — J45909 Unspecified asthma, uncomplicated: Secondary | ICD-10-CM | POA: Diagnosis not present

## 2021-05-31 DIAGNOSIS — K625 Hemorrhage of anus and rectum: Secondary | ICD-10-CM | POA: Diagnosis not present

## 2021-05-31 DIAGNOSIS — K6389 Other specified diseases of intestine: Secondary | ICD-10-CM | POA: Diagnosis not present

## 2021-05-31 DIAGNOSIS — K2950 Unspecified chronic gastritis without bleeding: Secondary | ICD-10-CM | POA: Diagnosis not present

## 2021-05-31 DIAGNOSIS — R9431 Abnormal electrocardiogram [ECG] [EKG]: Secondary | ICD-10-CM | POA: Diagnosis not present

## 2021-05-31 DIAGNOSIS — D500 Iron deficiency anemia secondary to blood loss (chronic): Secondary | ICD-10-CM

## 2021-05-31 DIAGNOSIS — Z79899 Other long term (current) drug therapy: Secondary | ICD-10-CM | POA: Diagnosis not present

## 2021-05-31 DIAGNOSIS — R195 Other fecal abnormalities: Secondary | ICD-10-CM | POA: Diagnosis not present

## 2021-05-31 DIAGNOSIS — R1010 Upper abdominal pain, unspecified: Secondary | ICD-10-CM | POA: Diagnosis not present

## 2021-05-31 DIAGNOSIS — K2920 Alcoholic gastritis without bleeding: Secondary | ICD-10-CM | POA: Insufficient documentation

## 2021-05-31 DIAGNOSIS — I428 Other cardiomyopathies: Secondary | ICD-10-CM | POA: Diagnosis not present

## 2021-05-31 DIAGNOSIS — K5900 Constipation, unspecified: Secondary | ICD-10-CM | POA: Diagnosis not present

## 2021-05-31 DIAGNOSIS — F1910 Other psychoactive substance abuse, uncomplicated: Secondary | ICD-10-CM | POA: Insufficient documentation

## 2021-05-31 DIAGNOSIS — F1210 Cannabis abuse, uncomplicated: Secondary | ICD-10-CM | POA: Insufficient documentation

## 2021-05-31 DIAGNOSIS — K319 Disease of stomach and duodenum, unspecified: Secondary | ICD-10-CM | POA: Diagnosis not present

## 2021-05-31 DIAGNOSIS — K921 Melena: Secondary | ICD-10-CM

## 2021-05-31 DIAGNOSIS — K219 Gastro-esophageal reflux disease without esophagitis: Secondary | ICD-10-CM | POA: Diagnosis not present

## 2021-05-31 HISTORY — PX: POLYPECTOMY: SHX5525

## 2021-05-31 HISTORY — PX: ESOPHAGOGASTRODUODENOSCOPY (EGD) WITH PROPOFOL: SHX5813

## 2021-05-31 HISTORY — PX: COLONOSCOPY WITH PROPOFOL: SHX5780

## 2021-05-31 HISTORY — PX: BIOPSY: SHX5522

## 2021-05-31 SURGERY — ESOPHAGOGASTRODUODENOSCOPY (EGD) WITH PROPOFOL
Anesthesia: Monitor Anesthesia Care

## 2021-05-31 MED ORDER — ONDANSETRON HCL 4 MG/2ML IJ SOLN
INTRAMUSCULAR | Status: DC | PRN
  Administered 2021-05-31: 4 mg via INTRAVENOUS

## 2021-05-31 MED ORDER — PROPOFOL 500 MG/50ML IV EMUL
INTRAVENOUS | Status: AC
  Filled 2021-05-31: qty 50

## 2021-05-31 MED ORDER — LIDOCAINE 2% (20 MG/ML) 5 ML SYRINGE
INTRAMUSCULAR | Status: DC | PRN
  Administered 2021-05-31: 100 mg via INTRAVENOUS

## 2021-05-31 MED ORDER — PROPOFOL 500 MG/50ML IV EMUL
INTRAVENOUS | Status: DC | PRN
  Administered 2021-05-31: 150 ug/kg/min via INTRAVENOUS

## 2021-05-31 MED ORDER — SODIUM CHLORIDE 0.9 % IV SOLN: INTRAVENOUS | Status: DC

## 2021-05-31 MED ORDER — PHENYLEPHRINE 40 MCG/ML (10ML) SYRINGE FOR IV PUSH (FOR BLOOD PRESSURE SUPPORT)
PREFILLED_SYRINGE | INTRAVENOUS | Status: DC | PRN
  Administered 2021-05-31: 80 ug via INTRAVENOUS

## 2021-05-31 MED ORDER — LACTATED RINGERS IV SOLN
INTRAVENOUS | Status: DC
  Administered 2021-05-31: 1000 mL via INTRAVENOUS

## 2021-05-31 MED ORDER — PROPOFOL 10 MG/ML IV BOLUS
INTRAVENOUS | Status: DC | PRN
  Administered 2021-05-31: 40 mg via INTRAVENOUS
  Administered 2021-05-31: 50 mg via INTRAVENOUS

## 2021-05-31 MED ORDER — PANTOPRAZOLE SODIUM 40 MG PO TBEC: 40.0000 mg | DELAYED_RELEASE_TABLET | Freq: Two times a day (BID) | ORAL | 3 refills | Status: DC

## 2021-05-31 SURGICAL SUPPLY — 25 items

## 2021-05-31 NOTE — H&P (Addendum)
Referring Provider: No ref. 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    PLAN: EGD and colonoscopy at the hospital   HPI: Robert [name] is a 41 y.o. male who presents for endoscopic evaluation of iron deficiency anemia and Hemoccult positive stools.  He has asthma, biventricular heart failure, nonischemic cardiomyopathy, prolonged QTc interval, PE 09/2019, polysubstance abuse (alcohol, cocaine, marijuana and tobacco), and a history of severe alcohol withdrawal/delirium tremens which required numerous hospital admissions. He was admitted to the hospital ICU 5/29 - 10/13/2020 with severe sepsis secondary to aspiration pneumonia treated with IV antibiotics with severe alcohol withdrawal/delirium tremens. He has continued to have multiple, frequent ED visits.  He has recently completed [facility]. Echocardiogram 07/10/20 showed an EF of 30%.  He has had bleeding noted on the stool and on the toilet paper as well as abdominal pain, bloating, and gas. There is associated straining. No recent bleeding. Intermittent rectal pain. He started using the Anusol HC suppository last week.   Has been started on iron supplements for iron deficiency and this is worsening his constipation.   Most recent labs: - 07/28/20: Fecal occult blood + - 11/21/20: hemoglobin 11, MCV 91, RDW  14, platelets 227 - 01/12/21: iron 54, ferritin 23, % saturation 13, hemoglobin 12, platelets 266 - 03/05/21: normal BMP except for creatinine 1.28  Prior endoscopic evaluation: - EGD with Dr. Penelope Coop 11/02/19: normal esophagus, erythematous mucosa in the stomach, erythematous duodenopathy. No specimens collected.  Prior abdominal imaging: - CT abd/pelvis 10/08/20: Hepatic steatosis, no CT cause of abdominal pain  Past Medical History:  Diagnosis Date   AKI (acute kidney injury) ([place]) 10/30/2019   Alcohol use    Alcohol withdrawal ([place]) 03/06/2020   Anxiety    Asthma    CHF (congestive heart failure) ([facility])    Depression    GERD (gastroesophageal reflux disease)    Pulmonary embolism ([facility])    Seizure ([place])     Past Surgical History:  Procedure Laterality Date   ESOPHAGOGASTRODUODENOSCOPY (EGD) WITH PROPOFOL N/A 11/01/2019   Procedure: ESOPHAGOGASTRODUODENOSCOPY (EGD) WITH PROPOFOL;  Surgeon: [name], MD;  Location: [facility] ENDOSCOPY;  Service: Endoscopy;  Laterality: N/A;   RIGHT/LEFT HEART CATH AND CORONARY ANGIOGRAPHY N/A 10/06/2019   Procedure: RIGHT/LEFT HEART CATH AND CORONARY ANGIOGRAPHY;  Surgeon: [name], MD;  Location: [facility];  Service: Cardiovascular;  Laterality: N/A;   WRIST SURGERY Right    age17     Current Facility-Administered Medications  Medication Dose Route Frequency Provider Last Rate Last Admin   lactated ringers infusion   Intravenous Continuous Thornton Park, MD        Allergies as of 03/20/2021 - Review Complete 03/12/2021  Allergen Reaction Noted   Bactrim [sulfamethoxazole-trimethoprim] Rash 10/30/2019    Family History  Problem Relation Age of Onset   Heart failure  Father    Diabetes Father    Kidney disease Father    Heart disease Father 59   Heart disease Maternal Grandfather 49   Heart attack Maternal Grandfather    Heart disease Paternal Grandfather 65   Lung cancer Maternal Aunt      Physical Exam: General:    Alert,  well-nourished, pleasant and cooperative in NAD Head:  Normocephalic and atraumatic. Eyes:  Sclera clear, no icterus.   Conjunctiva pink. Abdomen:  Soft, nontender, nondistended, normal bowel sounds, no rebound or guarding. No hepatosplenomegaly.   Neurologic:  Alert and  oriented x4;  grossly nonfocal Skin:  Intact without significant lesions or rashes. Psych:  Alert and cooperative. Normal mood and affect.   Ada Holness L. Tarri Glenn, MD, MPH 05/31/2021, 10:32 AM

## 2021-05-31 NOTE — Op Note (Signed)
[facility] Patient Name: Robert [name] Procedure Date: 05/31/2021 MRN: [id number] Attending MD: Thornton Park MD, MD Date of Birth: [birth year] CSN: [id number] Age: 41 Admit Type: Outpatient Procedure:                Colonoscopy Indications:              Heme positive stool, Unexplained iron deficiency                            anemia Providers:                Thornton Park MD, MD, Jaci Carrel, RN, Cletis Athens, Technician Referring MD:              Medicines:                Monitored Anesthesia Care Complications:            No immediate complications. Estimated blood loss:                            Minimal. Estimated Blood Loss:     Estimated blood loss was minimal. Procedure:                Pre-Anesthesia Assessment:                           - Prior to the procedure, a History and Physical                            was performed, and patient medications and                            allergies were reviewed. The patient's tolerance of                            previous anesthesia was also reviewed. The risks                            and benefits of the procedure and the sedation                            options and risks were discussed with the patient.                            All questions were answered, and informed consent                            was obtained. Prior Anticoagulants: The patient has                            taken no previous anticoagulant or antiplatelet                            agents. ASA Grade Assessment: III - A patient  with                            severe systemic disease. After reviewing the risks                            and benefits, the patient was deemed in                            satisfactory condition to undergo the procedure.                           After obtaining informed consent, the colonoscope                            was passed under direct vision. Throughout the                             procedure, the patient's blood pressure, pulse, and                            oxygen saturations were monitored continuously. The                            CF-HQ190L ([id number]) Olympus colonoscope was                            introduced through the anus and advanced to the the                            cecum, identified by appendiceal orifice and                            ileocecal valve. The colonoscopy was performed                            without difficulty. The patient tolerated the                            procedure well. The quality of the bowel                            preparation was good. The ileocecal valve,                            appendiceal orifice, and rectum were photographed. Scope In: 12:14:31 PM Scope Out: 12:27:53 PM Scope Withdrawal Time: 0 hours 10 minutes 5 seconds  Total Procedure Duration: 0 hours 13 minutes 22 seconds  Findings:      The perianal and digital rectal examinations were normal.      A 2 mm polyp was found in the cecum. The polyp was flat. The polyp was       removed with a cold snare. Resection and retrieval were complete.       Estimated blood loss was minimal.      The exam  was otherwise without abnormality on direct and retroflexion       views. Impression:               - One 2 mm polyp in the cecum, removed with a cold                            snare. Resected and retrieved.                           - The examination was otherwise normal on direct                            and retroflexion views. Moderate Sedation:      Not Applicable - Patient had care per Anesthesia. Recommendation:           - Patient has a contact number available for                            emergencies. The signs and symptoms of potential                            delayed complications were discussed with the                            patient. Return to normal activities tomorrow.                            Written discharge  instructions were provided to the                            patient.                           - Resume previous diet.                           - Continue present medications.                           - Await pathology results.                           - Repeat colonoscopy date to be determined after                            pending pathology results are reviewed for                            surveillance.                           - Emerging evidence supports eating a diet of                            fruits, vegetables, grains, calcium, and yogurt  while reducing red meat and alcohol may reduce the                            risk of colon cancer. Procedure Code(s):        --- Professional ---                           [phone], Colonoscopy, flexible; with removal of                            tumor(s), polyp(s), or other lesion(s) by snare                            technique Diagnosis Code(s):        --- Professional ---                           K63.5, Polyp of colon                           R19.5, Other fecal abnormalities                           D50.9, Iron deficiency anemia, unspecified CPT copyright 2019 American Medical Association. All rights reserved. The codes documented in this report are preliminary and upon coder review may  be revised to meet current compliance requirements. Thornton Park MD, MD 05/31/2021 12:36:51 PM This report has been signed electronically. Number of Addenda: 0

## 2021-05-31 NOTE — Op Note (Signed)
[facility] Patient Name: Robert [name] Procedure Date: 05/31/2021 MRN: [id number] Attending MD: Thornton Park MD, MD Date of Birth: 1981/04/29 CSN: [id number] Age: 41 Admit Type: Outpatient Procedure:                Upper GI endoscopy Indications:              Unexplained iron deficiency anemia, Heme positive                            stool Providers:                Thornton Park MD, MD, Jaci Carrel, RN, Cletis Athens, Technician Referring MD:              Medicines:                Monitored Anesthesia Care Complications:            No immediate complications. Estimated blood loss:                            Minimal. Estimated Blood Loss:     Estimated blood loss was minimal. Procedure:                Pre-Anesthesia Assessment:                           - Prior to the procedure, a History and Physical                            was performed, and patient medications and                            allergies were reviewed. The patient's tolerance of                            previous anesthesia was also reviewed. The risks                            and benefits of the procedure and the sedation                            options and risks were discussed with the patient.                            All questions were answered, and informed consent                            was obtained. Prior Anticoagulants: The patient has                            taken no previous anticoagulant or antiplatelet                            agents. ASA Grade Assessment: III -  A patient with                            severe systemic disease. After reviewing the risks                            and benefits, the patient was deemed in                            satisfactory condition to undergo the procedure.                           After obtaining informed consent, the endoscope was                            passed under direct vision. Throughout  the                            procedure, the patient's blood pressure, pulse, and                            oxygen saturations were monitored continuously. The                            [id number]-H190 ([id number]) Olympus endoscope was introduced                            through the mouth, and advanced to the third part                            of duodenum. The upper GI endoscopy was                            accomplished without difficulty. The patient                            tolerated the procedure well. Scope In: Scope Out: Findings:      The examined esophagus was normal. Z-line located 38 cm from the       incisors.      Diffuse mild mucosal changes characterized by an increased vascular       pattern were found in the cardia, in the gastric fundus and in the       gastric body. Biopsies were taken from the atrum, body, and fundus with       a cold forceps for histology. Estimated blood loss was minimal.      Multiple small nodules with no bleeding and no stigmata of recent       bleeding were found in the distal gastric body. Tunneled biopsies were       taken with a cold forceps for histology. Estimated blood loss was       minimal.      Diffuse moderately erythematous mucosa without active bleeding and with       no stigmata of bleeding was found in the duodenal bulb. Biopsies were       taken with a cold forceps for  histology. Estimated blood loss was       minimal.      The cardia and gastric fundus were normal on retroflexion.      The exam was otherwise without abnormality. Impression:               - Normal esophagus.                           - Increased vascular pattern mucosa in the cardia,                            gastric fundus and gastric body. Biopsied.                           - Gastric nodules. Biopsied.                           - Erythematous duodenopathy. Biopsied.                           - All of these findings may contribute to anemia.                            - The examination was otherwise normal. Moderate Sedation:      Not Applicable - Patient had care per Anesthesia. Recommendation:           - Patient has a contact number available for                            emergencies. The signs and symptoms of potential                            delayed complications were discussed with the                            patient. Return to normal activities tomorrow.                            Written discharge instructions were provided to the                            patient.                           - Resume previous diet.                           - Continue present medications.                           - Resume pantoprazole 40 mg twice daily for at                            least 10 weeks, then reduce to 40 mg daily.                           -  No aspirin, ibuprofen, naproxen, or other                            non-steroidal anti-inflammatory drugs.                           - Await pathology results. Procedure Code(s):        --- Professional ---                           [phone], Esophagogastroduodenoscopy, flexible,                            transoral; with biopsy, single or multiple Diagnosis Code(s):        --- Professional ---                           K31.89, Other diseases of stomach and duodenum                           D50.9, Iron deficiency anemia, unspecified                           R19.5, Other fecal abnormalities CPT copyright 2019 American Medical Association. All rights reserved. The codes documented in this report are preliminary and upon coder review may  be revised to meet current compliance requirements. Thornton Park MD, MD 05/31/2021 12:32:48 PM This report has been signed electronically. Number of Addenda: 0

## 2021-05-31 NOTE — Discharge Instructions (Signed)
YOU HAD AN ENDOSCOPIC PROCEDURE TODAY: Refer to the procedure report and other information in the discharge instructions given to you for any specific questions about what was found during the examination. If this information does not answer your questions, please call [place] office at [phone] to clarify.  ° °YOU SHOULD EXPECT: Some feelings of bloating in the abdomen. Passage of more gas than usual. Walking can help get rid of the air that was put into your GI tract during the procedure and reduce the bloating. If you had a lower endoscopy (such as a colonoscopy or flexible sigmoidoscopy) you may notice spotting of blood in your stool or on the toilet paper. Some abdominal soreness may be present for a day or two, also. ° °DIET: Your first meal following the procedure should be a light meal and then it is ok to progress to your normal diet. A half-sandwich or bowl of soup is an example of a good first meal. Heavy or fried foods are harder to digest and may make you feel nauseous or bloated. Drink plenty of fluids but you should avoid alcoholic beverages for 24 hours. If you had a esophageal dilation, please see attached instructions for diet.   ° °ACTIVITY: Your care partner should take you home directly after the procedure. You should plan to take it easy, moving slowly for the rest of the day. You can resume normal activity the day after the procedure however YOU SHOULD NOT DRIVE, use power tools, machinery or perform tasks that involve climbing or major physical exertion for 24 hours (because of the sedation medicines used during the test).  ° °SYMPTOMS TO REPORT IMMEDIATELY: °A gastroenterologist can be reached at any hour. Please call [phone]  for any of the following symptoms:  °Following lower endoscopy (colonoscopy, flexible sigmoidoscopy) °Excessive amounts of blood in the stool  °Significant tenderness, worsening of abdominal pains  °Swelling of the abdomen that is new, acute  °Fever of 100° or  higher  °Following upper endoscopy (EGD, EUS, ERCP, esophageal dilation) °Vomiting of blood or coffee ground material  °New, significant abdominal pain  °New, significant chest pain or pain under the shoulder blades  °Painful or persistently difficult swallowing  °New shortness of breath  °Black, tarry-looking or red, bloody stools ° °FOLLOW UP:  °If any biopsies were taken you will be contacted by phone or by letter within the next 1-3 weeks. Call [phone]  if you have not heard about the biopsies in 3 weeks.  °Please also call with any specific questions about appointments or follow up tests. ° °

## 2021-05-31 NOTE — Transfer of Care (Signed)
Immediate Anesthesia Transfer of Care Note  Patient: Robert [name]  Procedure(s) Performed: ESOPHAGOGASTRODUODENOSCOPY (EGD) WITH PROPOFOL COLONOSCOPY WITH PROPOFOL BIOPSY POLYPECTOMY  Patient Location: PACU  Anesthesia Type:MAC  Level of Consciousness: awake, alert  and oriented  Airway & Oxygen Therapy: Patient Spontanous Breathing and Patient connected to face mask oxygen  Post-op Assessment: Report given to RN and Post -op Vital signs reviewed and stable  Post vital signs: Reviewed and stable  Last Vitals:  Vitals Value Taken Time  BP    Temp    Pulse    Resp 14 05/31/21 1235  SpO2    Vitals shown include unvalidated device data.  Last Pain:  Vitals:   05/31/21 1030  TempSrc: Temporal         Complications: No notable events documented.

## 2021-05-31 NOTE — Anesthesia Postprocedure Evaluation (Signed)
Anesthesia Post Note  Patient: Dewitt Judice  Procedure(s) Performed: ESOPHAGOGASTRODUODENOSCOPY (EGD) WITH PROPOFOL COLONOSCOPY WITH PROPOFOL BIOPSY POLYPECTOMY     Patient location during evaluation: Endoscopy Anesthesia Type: MAC Level of consciousness: awake and alert Pain management: pain level controlled Vital Signs Assessment: post-procedure vital signs reviewed and stable Respiratory status: spontaneous breathing, nonlabored ventilation, respiratory function stable and patient connected to nasal cannula oxygen Cardiovascular status: blood pressure returned to baseline and stable Postop Assessment: no apparent nausea or vomiting Anesthetic complications: no   No notable events documented.  Last Vitals:  Vitals:   05/31/21 1245 05/31/21 1255  BP: 130/69 (!) 141/68  Pulse: 72 60  Resp: 16 17  Temp:    SpO2: 100% 100%    Last Pain:  Vitals:   05/31/21 1255  TempSrc:   PainSc: 0-No pain                 Leilana Mcquire DANIEL

## 2021-06-01 ENCOUNTER — Encounter (HOSPITAL_COMMUNITY): Payer: Self-pay | Admitting: Gastroenterology

## 2021-06-04 LAB — SURGICAL PATHOLOGY

## 2021-06-06 ENCOUNTER — Encounter (HOSPITAL_COMMUNITY): Payer: BC Managed Care – PPO | Admitting: Internal Medicine

## 2021-06-08 ENCOUNTER — Other Ambulatory Visit: Payer: Self-pay

## 2021-06-08 DIAGNOSIS — D509 Iron deficiency anemia, unspecified: Secondary | ICD-10-CM

## 2021-06-27 ENCOUNTER — Encounter (HOSPITAL_COMMUNITY): Payer: BC Managed Care – PPO | Admitting: Internal Medicine

## 2021-07-02 NOTE — Progress Notes (Signed)
ADVANCED HF CLINIC NOTE  PCP: [name] NP Primary HF Cardiologist: Dr Haroldine Laws    Reason for F/u: Heart Failure    HPI: Robert [name] is a 41 y.o. male with h/o heavy ETOH and cocaine use, PE,  biventricular systolic heart failure and tobacco abuse.   Admitted with acute onset biventricular HF in 5/21. EF 15%. RV down. He was drinking at least 2 beers every night and about 12-24 over the weekend. Also drinking 1/5 of liqour every other day. Cath showed normal coronaries. Elevated filling pressures with normal output. Placed on milrinone and diuresed with IV lasix. As he improved milrinone was weaned off . Transitioned to oral lasix. 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 CT showed small LLL PE. Placed on xarelto. Started on HF meds.  Discharge weight 178 pounds.   Readmitted 6/21 for mild acute blood loss related anemia. EGD showed no active bleeding but evidence of some gastritis.  He was switched to oral PPI twice daily, H&H remained stable and he did not require any transfusions.  He was instructed to continue Xarelto.   Continues to have very frequent ED visits. Admitted 10/25-10/28/21 for ETOH withdrawal symptoms and AKI. Admitted 03/23/2020 with AMS in the setting ETOH abuse. Hypotensive, AKI, and with lactic acidosis. Entresto, spiro, and lasix. All restarted prior to discharge. Returned to the ED on 03/26/20 with nausea/vomiting.   Echo 10/21 EF 30-35% mild RV HK.   Admitted 07/10/20-07/12/20 with seizure in the setting of ETOH withdrawal.   Admitted 07/29/20-07/31/20 with n/v abdominal pain. Had AKI. GI consulted and recommended EGD. He felt better and refused EGD. Plan to continue PPI + carafate. Xarelto and zoloft stopped. Discharged earlier today from the hospital.   Evaluated in the ED 08/19/20 with AKI, 09/01/20 with ETOH withdrawl, and 09/11/20 with atypical chest pain.   Admitted 10/08/20 for CP, SOB, N/V. Blood ethanol level 180. CXR concerning for PNA, possible  infected pneumatocele. Treated with IV abx. Hospitalization c/b AKI and acute ETOH withdrawal, requiring ICU care. Seroquel and sertraline stopped at discharge.  Presented to [facility] 7/22 with diarrhea, N/V. Last ETOH was 2-3 days prior. He was admitted and found to have AKI, likely due to volume depletion from diarrhea and vomiting. SCr peaked at 3.2. Entresto and spiro held, restarted at discharge. SCr down to 1.25 at discharge after gentle IV hydration. Weight 194 lbs.  S/p EGD/colonoscopy 1/23 had 1 small polyp removed, normal esophagus.  Today he returns for HF follow up. He is back to working old job at [employer], no SOB with physicality of job. Occasional dizziness when he stands too fast. Overall feeling fine. Denies abnormal bleeding, CP, palpitations, edema, or PND/Orthopnea. Appetite ok. No fever or chills. Weight at home 204 pounds. Ran out of Entresto 3 days ago. No further ETOH since 11/2020, smokes a few cigs a day. Trying to be tobacco free by the end of this month.  Cardiac studies. Echo (5/21): EF <20%  Prominent trabeculation. Grade III DD  R/LHC (5/21): showed normal coronaries, elevated filling pressures with normal output.  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 No tamponade physiology.  Echo (10/21): EF 30-35% RV normal. No effusion.  Echo (2/22): EF 30%   ROS: All systems negative except as listed in HPI, PMH and Problem List.  SH:  Social History  Socioeconomic History   Marital status: Single    Spouse name: Not on file   Number of children: Not on file   Years of education: Not on file   Highest education level: Not on file  Occupational History   Occupation: [employer]  Tobacco Use   Smoking status: Every Day    Packs/day: 0.25    Years: 24.00    Pack years: 6.00    Types: Cigarettes   Smokeless tobacco: Never  Vaping Use   Vaping Use: Former   Substance and Sexual Activity   Alcohol use: Yes    Alcohol/week: 8.0 standard drinks    Types: 8 Shots of liquor per week    Comment: heavy use since age 78   Drug use: Yes    Frequency: 1.0 times per week    Types: Marijuana   Sexual activity: Yes  Other Topics Concern   Not on file  Social History Narrative   Not on file   Social Determinants of Health   Financial Resource Strain: Not on file  Food Insecurity: Not on file  Transportation Needs: Not on file  Physical Activity: Not on file  Stress: Not on file  Social Connections: Not on file  Intimate Partner Violence: Not on file   FH:  Family History  Problem Relation Age of Onset   Heart failure Father    Diabetes Father    Kidney disease Father    Heart disease Father 42   Heart disease Maternal Grandfather 87   Heart attack Maternal Grandfather    Heart disease Paternal Grandfather 58   Lung cancer Maternal Aunt    Past Medical History:  Diagnosis Date   AKI (acute kidney injury) ([place]) 10/30/2019   Alcohol use    Alcohol withdrawal ([place]) 03/06/2020   Anxiety    Asthma    CHF (congestive heart failure) (HCC)    Depression    GERD (gastroesophageal reflux disease)    Pulmonary embolism (HCC)    Seizure (HCC)    Current Outpatient Medications  Medication Sig Dispense Refill   carvedilol (COREG) 3.125 MG tablet Take 1 tablet by mouth twice daily 60 tablet 1   dapagliflozin propanediol (FARXIGA) 10 MG TABS tablet Take 1 tablet (10 mg total) by mouth daily before breakfast. 30 tablet 11   folic acid (FOLVITE) 1 MG tablet Take 1 tablet (1 mg total) by mouth daily. 30 tablet 3   furosemide (LASIX) 20 MG tablet Take 1 tablet (20 mg total) by mouth daily. 30 tablet 3   ivabradine (CORLANOR) 7.5 MG TABS tablet Take 1 tablet (7.5 mg total) by mouth 2 (two) times daily with a meal. 60 tablet 11   Menthol, Topical Analgesic, (BIOFREEZE EX) Apply 1 application topically daily as needed (pain).     Naphazoline HCl  (CLEAR EYES OP) Place 1 drop into both eyes daily as needed (redness/ irritation).     nicotine (NICODERM CQ - DOSED IN MG/24 HOURS) 21 mg/24hr patch Place 21 mg onto the skin daily as needed (nicotine dependence).     pantoprazole (PROTONIX) 40 MG tablet Take 1 tablet (40 mg total) by mouth 2 (two) times daily. 180 tablet 3   sacubitril-valsartan (ENTRESTO) 97-103 MG Take 1 tablet by mouth 2 (two) times daily. 60 tablet 3   spironolactone (ALDACTONE) 25 MG tablet Take 0.5 tablets (12.5 mg total) by mouth daily. 90 tablet 0   No current facility-administered medications for this encounter.   Wt Readings from Last  3 Encounters:  07/03/21 92.4 kg (203 lb 9.6 oz)  05/31/21 91.6 kg (202 lb)  04/09/21 92.8 kg (204 lb 9.6 oz)   BP 110/68    Pulse 72    Ht 5\' 10"  (1.778 m)    Wt 92.4 kg (203 lb 9.6 oz)    SpO2 97%    BMI 29.21 kg/m   PHYSICAL EXAM: General:  NAD. No resp difficulty HEENT: Normal Neck: Supple. No JVD. Carotids 2+ bilat; no bruits. No lymphadenopathy or thryomegaly appreciated. Cor: PMI nondisplaced. Regular rate & rhythm. No rubs, gallops or murmurs. Lungs: Clear Abdomen: Soft, nontender, nondistended. No hepatosplenomegaly. No bruits or masses. Good bowel sounds. Extremities: No cyanosis, clubbing, rash, edema Neuro: Alert & oriented x 3, cranial nerves grossly intact. Moves all 4 extremities w/o difficulty. Flat affect.  ASSESSMENT & PLAN: 1. Chronic Systolic Heart Failure, NICM  - Echo (5/21): EF 15% RV moderately reduced. Suspect ETOH and cocaine playing a role.  - LHC (5/21): normal cors. 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  - Echo (10/21): EF 30-35% RV normal. No effusion.  - Echo (2/22): EF 30%  - Stable NYHA II. Volume looks good today. - Continue spiro 12.5 mg daily. - Continue Lasix 20 mg daily. - Continue Entresto 97-103 mg bid. Needs to pick up Rx today. - Continue Coreg 3.125 mg  bid.  - Continue Farxiga 10 mg daily. - Continue Corlanor 7.5 mg bid. - Referred for genetic testing for LVNC.  - Not a candidate for ICD or advanced therapies w/ polysubstance. - Update echo. - BMET and BNP today.  2. H/o Syncope - 07/10/20. - Denies related to ETOH intoxication.   3. H/o Pulmonary Emboli - CTA w/ small LLL PE 5/21. - LE Venous dopplers negative for DVT.  - Given small PE. Risk of ongoing AC likely outweighs benefit. - Off Xarelto.   4. ETOH Abuse - Heavy drinker for many years since the age of 10.  - 01/2020 had 28 day detox [facility] but started drinking again.  - Has remained sober since 11/2020.  5. H/o Cocaine Abuse - No recent use.  - UDS 10/08/20 negative.  6. Tobacco Abuse - Down to 3 cigarettes/day. - Discussed cessation.  7. H/o GIB/ Gastritis w/ ABLA - h/o heavy ETOH abuse + chronic anticoagulation therapy w/ Xarelto - Off xarelto.  - EGD & colonoscopy 1/23 with 1 polyp removed, otherwise normal studies.  Follow up with Dr. Haroldine Laws in 3 months.  [place], FNP-BC  07/03/21 12:21 PM

## 2021-07-03 ENCOUNTER — Other Ambulatory Visit (HOSPITAL_COMMUNITY): Payer: Self-pay

## 2021-07-03 ENCOUNTER — Telehealth (HOSPITAL_COMMUNITY): Payer: Self-pay | Admitting: Cardiology

## 2021-07-03 ENCOUNTER — Ambulatory Visit (HOSPITAL_COMMUNITY)
Admission: RE | Admit: 2021-07-03 | Discharge: 2021-07-03 | Disposition: A | Discharge status: Home / Self Care | Payer: BC Managed Care – PPO | Source: Ambulatory Visit | Attending: Family Medicine | Admitting: Family Medicine

## 2021-07-03 ENCOUNTER — Encounter (HOSPITAL_COMMUNITY): Payer: Self-pay

## 2021-07-03 ENCOUNTER — Other Ambulatory Visit: Payer: Self-pay

## 2021-07-03 VITALS — BP 110/68 | HR 72 | Ht 70.0 in | Wt 203.6 lb

## 2021-07-03 DIAGNOSIS — Z79899 Other long term (current) drug therapy: Secondary | ICD-10-CM | POA: Insufficient documentation

## 2021-07-03 DIAGNOSIS — I5022 Chronic systolic (congestive) heart failure: Secondary | ICD-10-CM | POA: Diagnosis not present

## 2021-07-03 DIAGNOSIS — I2699 Other pulmonary embolism without acute cor pulmonale: Secondary | ICD-10-CM | POA: Diagnosis not present

## 2021-07-03 DIAGNOSIS — I3139 Other pericardial effusion (noninflammatory): Secondary | ICD-10-CM | POA: Diagnosis not present

## 2021-07-03 DIAGNOSIS — Z7984 Long term (current) use of oral hypoglycemic drugs: Secondary | ICD-10-CM | POA: Insufficient documentation

## 2021-07-03 DIAGNOSIS — F1410 Cocaine abuse, uncomplicated: Secondary | ICD-10-CM

## 2021-07-03 DIAGNOSIS — Z8719 Personal history of other diseases of the digestive system: Secondary | ICD-10-CM | POA: Diagnosis not present

## 2021-07-03 DIAGNOSIS — F1411 Cocaine abuse, in remission: Secondary | ICD-10-CM | POA: Diagnosis not present

## 2021-07-03 DIAGNOSIS — Z720 Tobacco use: Secondary | ICD-10-CM

## 2021-07-03 DIAGNOSIS — D508 Other iron deficiency anemias: Secondary | ICD-10-CM

## 2021-07-03 DIAGNOSIS — R55 Syncope and collapse: Secondary | ICD-10-CM | POA: Diagnosis not present

## 2021-07-03 DIAGNOSIS — D62 Acute posthemorrhagic anemia: Secondary | ICD-10-CM | POA: Diagnosis not present

## 2021-07-03 DIAGNOSIS — F1011 Alcohol abuse, in remission: Secondary | ICD-10-CM | POA: Insufficient documentation

## 2021-07-03 DIAGNOSIS — I428 Other cardiomyopathies: Secondary | ICD-10-CM | POA: Diagnosis not present

## 2021-07-03 DIAGNOSIS — Z86711 Personal history of pulmonary embolism: Secondary | ICD-10-CM | POA: Diagnosis not present

## 2021-07-03 DIAGNOSIS — F1010 Alcohol abuse, uncomplicated: Secondary | ICD-10-CM

## 2021-07-03 DIAGNOSIS — K2970 Gastritis, unspecified, without bleeding: Secondary | ICD-10-CM | POA: Insufficient documentation

## 2021-07-03 DIAGNOSIS — I5082 Biventricular heart failure: Secondary | ICD-10-CM | POA: Diagnosis not present

## 2021-07-03 DIAGNOSIS — F17210 Nicotine dependence, cigarettes, uncomplicated: Secondary | ICD-10-CM | POA: Insufficient documentation

## 2021-07-03 DIAGNOSIS — F1290 Cannabis use, unspecified, uncomplicated: Secondary | ICD-10-CM | POA: Insufficient documentation

## 2021-07-03 LAB — BASIC METABOLIC PANEL
Anion gap: 8 (ref 5–15)
BUN: 17 mg/dL (ref 6–20)
CO2: 24 mmol/L (ref 22–32)
Calcium: 9 mg/dL (ref 8.9–10.3)
Chloride: 105 mmol/L (ref 98–111)
Creatinine, Ser: 0.92 mg/dL (ref 0.61–1.24)
GFR, Estimated: >60 mL/min (ref >60)
Glucose, Bld: 92 mg/dL (ref 70–99)
Potassium: 4.3 mmol/L (ref 3.5–5.1)
Sodium: 137 mmol/L (ref 135–145)

## 2021-07-03 LAB — BRAIN NATRIURETIC PEPTIDE: B Natriuretic Peptide: 75.5 pg/mL (ref 0.0–100.0)

## 2021-07-03 MED ORDER — ENTRESTO 97-103 MG PO TABS: 1.0000 | ORAL_TABLET | Freq: Two times a day (BID) | ORAL | 1 refills | Status: DC

## 2021-07-03 MED ORDER — IVABRADINE HCL 7.5 MG PO TABS: 7.5000 mg | ORAL_TABLET | Freq: Two times a day (BID) | ORAL | 1 refills | Status: DC

## 2021-07-03 MED ORDER — DAPAGLIFLOZIN PROPANEDIOL 10 MG PO TABS: 10.0000 mg | ORAL_TABLET | Freq: Every day | ORAL | 1 refills | Status: DC

## 2021-07-03 NOTE — Patient Instructions (Signed)
Thank you for coming in today  Labs were done today, if any labs are abnormal the clinic will call you  Your physician has requested that you have an echocardiogram. Echocardiography is a painless test that uses sound waves to create images of your heart. It provides your doctor with information about the size and shape of your heart and how well your hearts chambers and valves are working. This procedure takes approximately one hour. There are no restrictions for this procedure.   Your physician recommends that you schedule a follow-up appointment in:  3 months with Dr. Haroldine Laws  At the [facility], you and your health needs are our priority. As part of our continuing mission to provide you with exceptional heart care, we have created designated Provider Care Teams. These Care Teams include your primary Cardiologist (physician) and Advanced Practice Providers (APPs- Physician Assistants and Nurse Practitioners) who all work together to provide you with the care you need, when you need it.   You may see any of the following providers on your designated Care Team at your next follow up: Dr Glori Bickers Dr Haynes Kerns, NP Lyda Jester, [place] [facility] [place], [place] Audry Riles, PharmD   Please be sure to bring in all your medications bottles to every appointment.   If you have any questions or concerns before your next appointment please send [place] a message through [place] or call our office at [phone].    TO LEAVE A MESSAGE FOR THE NURSE SELECT OPTION 2, PLEASE LEAVE A MESSAGE INCLUDING: YOUR NAME DATE OF BIRTH CALL BACK NUMBER REASON FOR CALL**this is important as we prioritize the call backs  YOU WILL RECEIVE A CALL BACK THE SAME DAY AS LONG AS YOU CALL BEFORE 4:00 PM

## 2021-07-03 NOTE — Telephone Encounter (Signed)
Pt called to report co pay card given did not  work for [employer]

## 2021-07-11 ENCOUNTER — Encounter (HOSPITAL_COMMUNITY): Payer: BC Managed Care – PPO | Admitting: Internal Medicine

## 2021-07-20 IMAGING — CT CT HEAD W/O CM
4 series · 16 of 47 positions shown, 18 images · non-contrast
Comparison: Head CT 03/26/2020.

CLINICAL DATA: Head trauma, abnormal mental status. Witness
seizure.

EXAM:
CT HEAD WITHOUT CONTRAST
TECHNIQUE: Contiguous axial images were obtained from the base of the skull
through the vertex without intravenous contrast.

[Series 3: head without · axial · non-contrast · 0.49mm/px · z∈[-144,-9]mm · 7 of 37 slices shown, 9 images]
[im 5/37  brain]
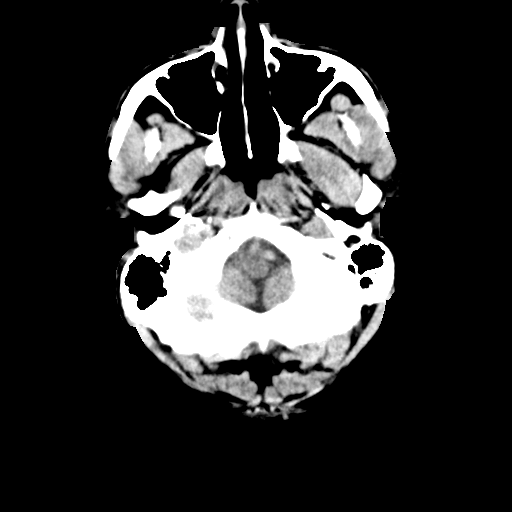
[im 5/37  bone]
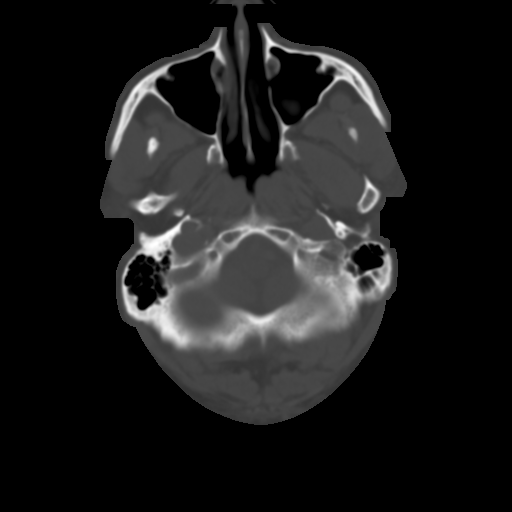
[im 10/37  brain]
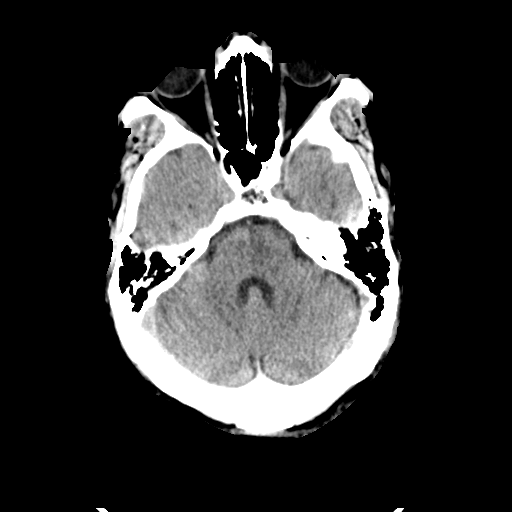
[im 14/37  brain]
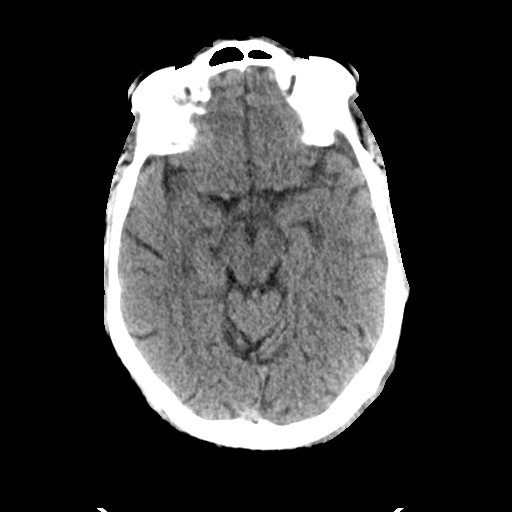
[im 19/37  brain]
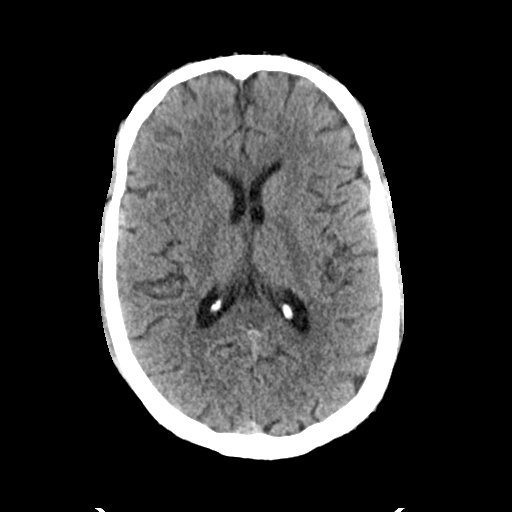
[im 23/37  brain]
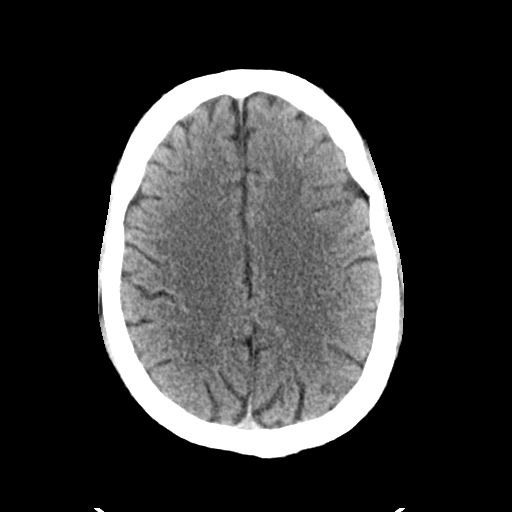
[im 23/37  bone]
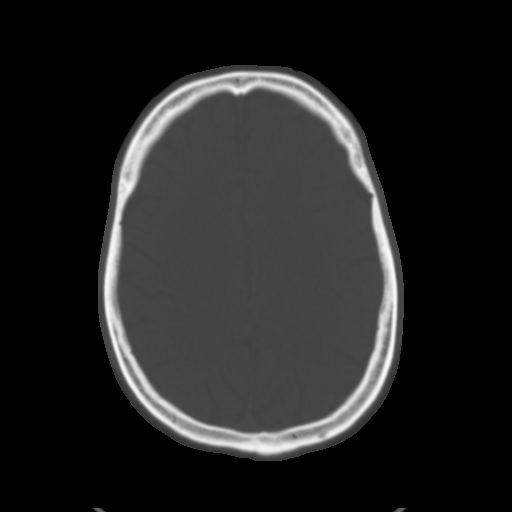
[im 28/37  brain]
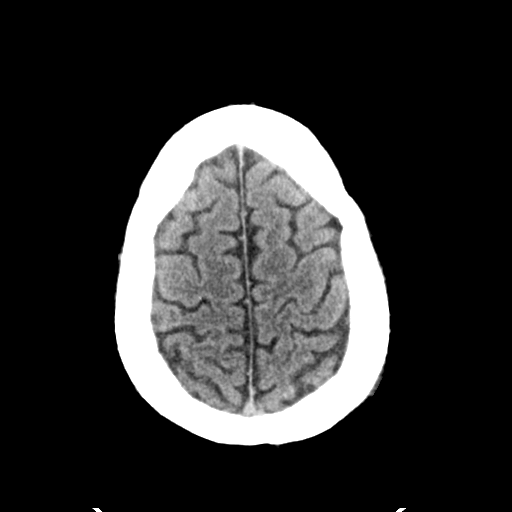
[im 32/37  brain]
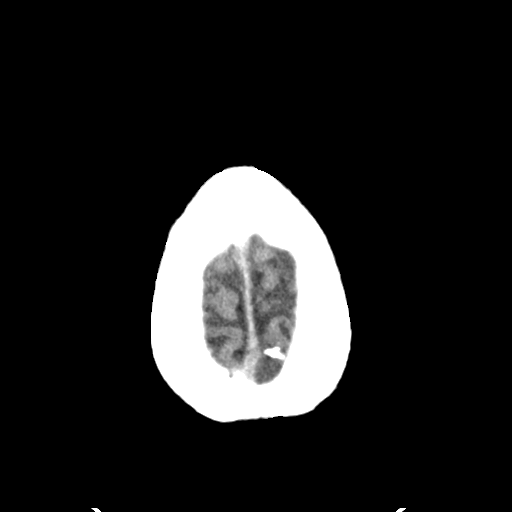

[Series 4: head bone · axial · 0.49mm/px · z∈[-146,-110]mm · 3 of 92 slices shown]
[im 10/92  bone]
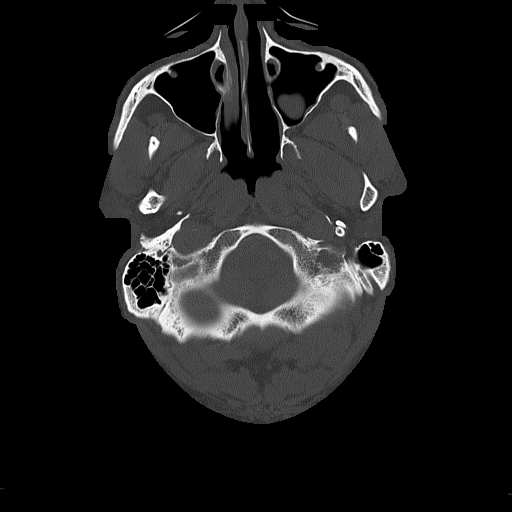
[im 19/92  bone]
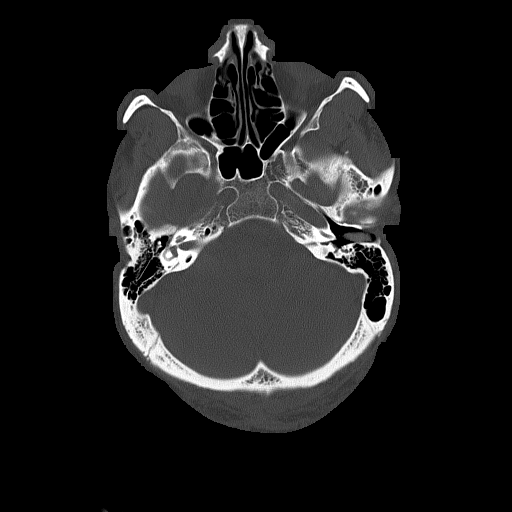
[im 28/92  bone]
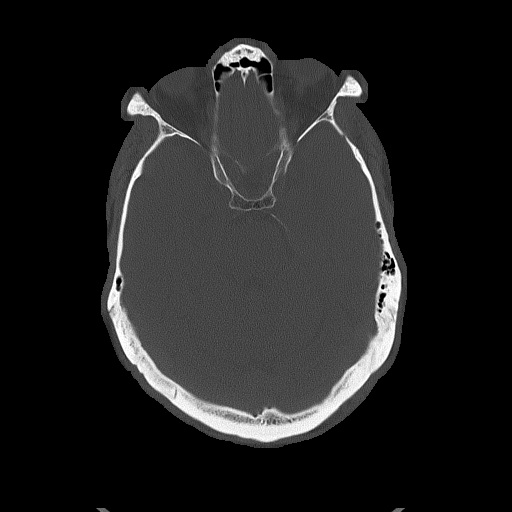

[Series 5: head without cor · coronal · non-contrast · 0.39mm/px · 3 of 70 slices shown]
[im 24/70  brain]
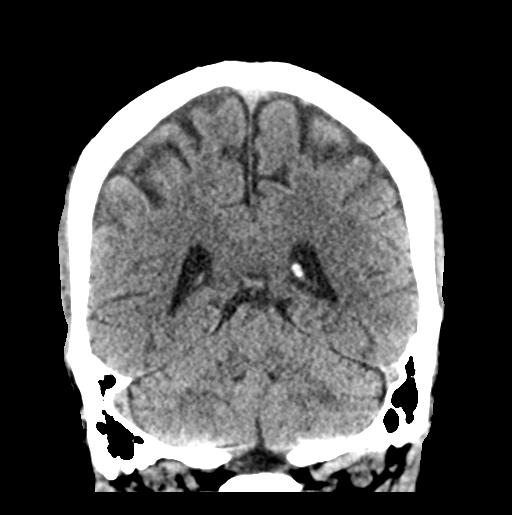
[im 31/70  brain]
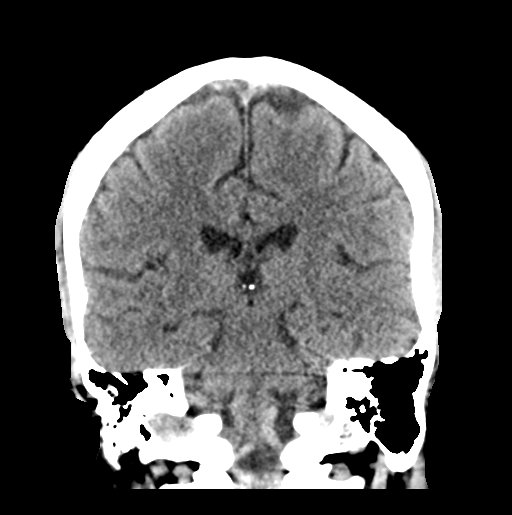
[im 39/70  brain]
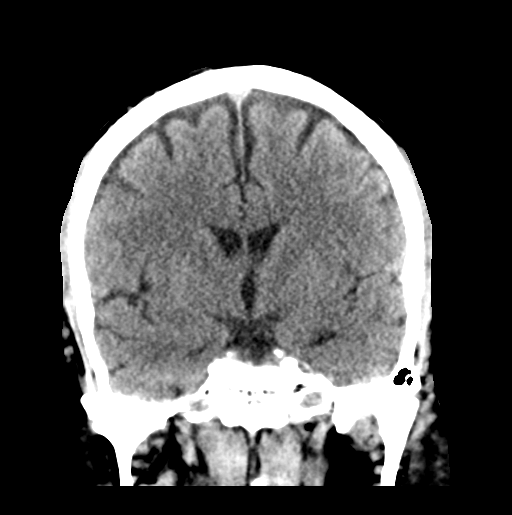

[Series 6: head without sag · sagittal · non-contrast · 0.34mm/px · 3 of 55 slices shown]
[im 19/55  brain]
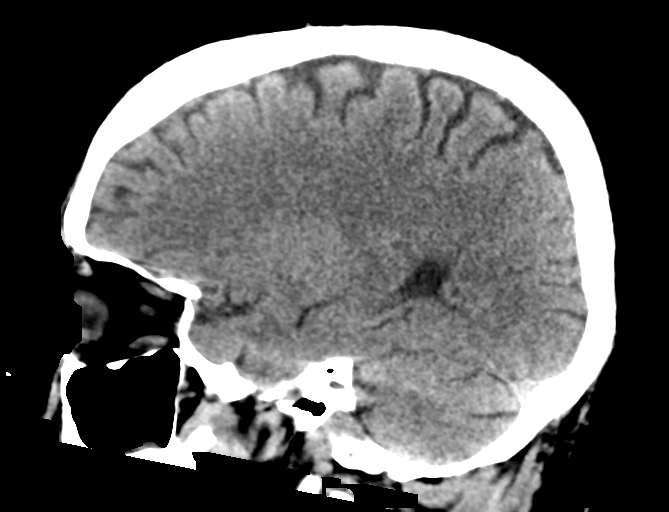
[im 28/55  brain]
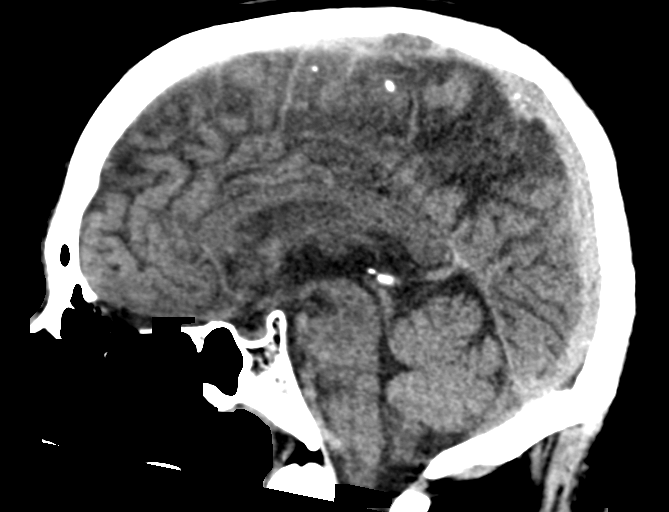
[im 37/55  brain]
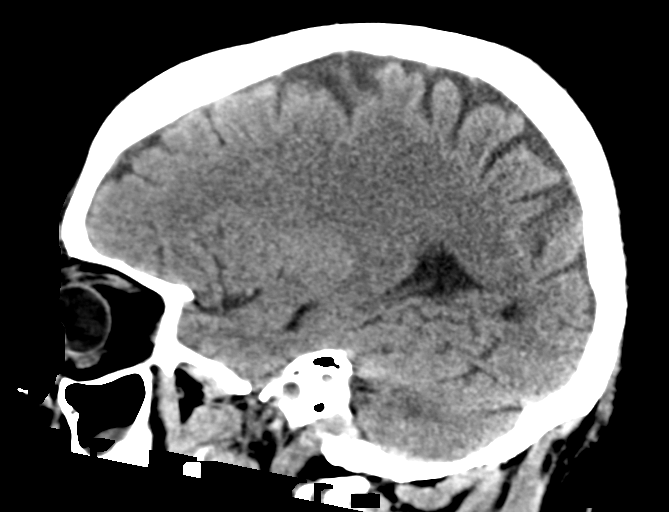

[16 of 47 positions shown; findings below may reference images not displayed]

FINDINGS: Brain:

Cerebral volume is normal.

There is no acute intracranial hemorrhage.

No demarcated cortical infarct.

No extra-axial fluid collection.

No evidence of intracranial mass.

No midline shift.

Vascular: No hyperdense vessel.

Skull: Normal. Negative for fracture or focal lesion.

Sinuses/Orbits: Visualized orbits show no acute finding. 2.7 cm left
maxillary sinus mucous retention cyst. Background trace mucosal
thickening within the bilateral maxillary sinuses. Mild partial
opacification of anterior right ethmoid air cells.
IMPRESSION: No evidence of acute intracranial abnormality.

Paranasal sinus disease as described, most notably a 2.7 cm mucous
retention cyst within the left maxillary sinus.

## 2021-07-30 ENCOUNTER — Other Ambulatory Visit (HOSPITAL_COMMUNITY): Payer: Self-pay | Admitting: Cardiology

## 2021-08-01 ENCOUNTER — Other Ambulatory Visit: Payer: Self-pay

## 2021-08-01 ENCOUNTER — Ambulatory Visit (HOSPITAL_COMMUNITY)
Admission: RE | Admit: 2021-08-01 | Discharge: 2021-08-01 | Disposition: A | Discharge status: Home / Self Care | Payer: BC Managed Care – PPO | Source: Ambulatory Visit | Attending: Nurse Practitioner | Admitting: Nurse Practitioner

## 2021-08-01 DIAGNOSIS — I5022 Chronic systolic (congestive) heart failure: Secondary | ICD-10-CM | POA: Diagnosis not present

## 2021-08-01 LAB — ECHOCARDIOGRAM COMPLETE
AR max vel: 2.48 cm2
AV Peak grad: 8.8 mmHg
Ao pk vel: 1.48 m/s
Area-P 1/2: 3.39 cm2
Calc EF: 33.6 %
S' Lateral: 4.5 cm
Single Plane A2C EF: 34.6 %
Single Plane A4C EF: 33.9 %

## 2021-10-08 NOTE — Progress Notes (Signed)
ADVANCED HF CLINIC NOTE  PCP: [name] NP Primary HF Cardiologist: Dr Haroldine Laws    Reason for F/u: Heart Failure    HPI: Mr.Larmon is a 41 y.o. male with h/o heavy ETOH and cocaine use, PE,  biventricular systolic heart failure and tobacco abuse.   Admitted with acute onset biventricular HF in 5/21. EF 15%. He was drinking at least 2 beers every night and about 12-24 over the weekend. Also drinking 1/5 of liqour every other day. Cath showed normal coronaries. Elevated filling pressures with normal output. Required milrinone. 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 CT showed small LLL PE. Placed on xarelto. Started on HF meds.  Discharge weight 178 pounds.   Readmitted 6/21 for mild acute blood loss related anemia. EGD showed no active bleeding but evidence of some gastritis. PPI increased. Repeat EGD/colonoscopy 1/23 had 1 small polyp removed, normal esophagus.  Has had numerous ED visits and admits for ETOH withdrawal and AKI. Reports stopping ETOH in 7/22  Echo 10/21 EF 30-35% mild RV HK.  Echo 3/23 EF 40-45%   Here for f/u. Doing well. Working at [employer]. No drinking. Compliant with heart meds. No CP, SOB, edema. Gets lightheaded if he stands up too quickly. Weight down 15 pounds because he is improving his diet.    Cardiac studies. Echo (5/21): EF <20%  Prominent trabeculation. Grade III DD  R/LHC (5/21): showed normal coronaries, elevated filling pressures with normal output.  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 No tamponade physiology.  Echo (10/21): EF 30-35% RV normal. No effusion.  Echo (2/22): EF 30%   ROS: All systems negative except as listed in HPI, PMH and Problem List.  SH:  Social History   Socioeconomic History   Marital status: Single    Spouse name: Not on file   Number of children: Not on file   Years of education: Not on  file   Highest education level: Not on file  Occupational History   Occupation: [employer]  Tobacco Use   Smoking status: Every Day    Packs/day: 0.25    Years: 24.00    Pack years: 6.00    Types: Cigarettes   Smokeless tobacco: Never  Vaping Use   Vaping Use: Former  Substance and Sexual Activity   Alcohol use: Yes    Alcohol/week: 8.0 standard drinks    Types: 8 Shots of liquor per week    Comment: heavy use since age 72   Drug use: Yes    Frequency: 1.0 times per week    Types: Marijuana   Sexual activity: Yes  Other Topics Concern   Not on file  Social History Narrative   Not on file   Social Determinants of Health   Financial Resource Strain: Not on file  Food Insecurity: Not on file  Transportation Needs: Not on file  Physical Activity: Not on file  Stress: Not on file  Social Connections: Not on file  Intimate Partner Violence: Not on file   FH:  Family History  Problem Relation Age of Onset   Heart failure Father    Diabetes Father    Kidney disease Father    Heart disease Father 5   Heart disease Maternal Grandfather 73   Heart attack Maternal Grandfather    Heart disease Paternal Grandfather  20   Lung cancer Maternal Aunt    Past Medical History:  Diagnosis Date   AKI (acute kidney injury) ([place]) 10/30/2019   Alcohol use    Alcohol withdrawal ([place]) 03/06/2020   Anxiety    Asthma    CHF (congestive heart failure) (HCC)    Depression    GERD (gastroesophageal reflux disease)    Pulmonary embolism (HCC)    Seizure (HCC)    Current Outpatient Medications  Medication Sig Dispense Refill   carvedilol (COREG) 3.125 MG tablet Take 1 tablet by mouth twice daily 60 tablet 0   dapagliflozin propanediol (FARXIGA) 10 MG TABS tablet Take 1 tablet (10 mg total) by mouth daily before breakfast. 90 tablet 1   folic acid (FOLVITE) 1 MG tablet Take 1 tablet (1 mg total) by mouth daily. 30 tablet 3   furosemide (LASIX) 20 MG tablet Take 1 tablet (20 mg  total) by mouth daily. 30 tablet 3   ivabradine (CORLANOR) 7.5 MG TABS tablet Take 1 tablet (7.5 mg total) by mouth 2 (two) times daily with a meal. 180 tablet 1   Menthol, Topical Analgesic, (BIOFREEZE EX) Apply 1 application topically daily as needed (pain).     Naphazoline HCl (CLEAR EYES OP) Place 1 drop into both eyes daily as needed (redness/ irritation).     nicotine (NICODERM CQ - DOSED IN MG/24 HOURS) 21 mg/24hr patch Place 21 mg onto the skin daily as needed (nicotine dependence).     pantoprazole (PROTONIX) 40 MG tablet Take 1 tablet (40 mg total) by mouth 2 (two) times daily. 180 tablet 3   sacubitril-valsartan (ENTRESTO) 97-103 MG Take 1 tablet by mouth 2 (two) times daily. 180 tablet 1   spironolactone (ALDACTONE) 25 MG tablet Take 0.5 tablets (12.5 mg total) by mouth daily. 90 tablet 0   No current facility-administered medications for this encounter.   Wt Readings from Last 3 Encounters:  10/10/21 85.5 kg (188 lb 9.6 oz)  07/03/21 92.4 kg (203 lb 9.6 oz)  05/31/21 91.6 kg (202 lb)   BP 108/70   Pulse 68   Wt 85.5 kg (188 lb 9.6 oz)   SpO2 98%   BMI 27.06 kg/m   PHYSICAL EXAM: General:  Well appearing. No resp difficulty HEENT: normal Neck: supple. no JVD. Carotids 2+ bilat; no bruits. No lymphadenopathy or thryomegaly appreciated. Cor: PMI nondisplaced. Regular rate & rhythm. No rubs, gallops or murmurs. Lungs: clear Abdomen: soft, nontender, nondistended. No hepatosplenomegaly. No bruits or masses. Good bowel sounds. Extremities: no cyanosis, clubbing, rash, edema Neuro: alert & orientedx3, cranial nerves grossly intact. moves all 4 extremities w/o difficulty. Affect pleasant  ASSESSMENT & PLAN: 1. Chronic Systolic Heart Failure, NICM  - Echo (5/21): EF 15% RV moderately reduced. Suspect ETOH and cocaine playing a role.  - LHC (5/21): normal cors. 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  -  Echo (10/21): EF 30-35% RV normal. No effusion.  - Echo (2/22): EF 30%  - Echo 3/23 EF 40-45%  - Doing great. NYHA I-II. Volume looks good today. Having some orthostasis. Change lasix to 20 mg only as needed - Continue spiro 12.5 mg daily. - Continue Entresto 97-103 mg bid. If orthostasis persists can cut Entresto back a bit.  - Continue Coreg 3.125 mg bid.  - Continue Farxiga 10 mg daily. - Drop Corlanor to 5 bid - Referred for genetic testing for LVNC.  - Labs today - Repeat  echo in 6 months  2. H/o Syncope - 07/10/20. - Resolved  3. H/o Pulmonary Emboli - CTA w/ small LLL PE 5/21. - LE Venous dopplers negative for DVT.  - Given small PE. Risk of ongoing AC likely outweighs benefit. - Off Xarelto.   4. ETOH Abuse - Heavy drinker for many years since the age of 30.  - 01/2020 had 28 day detox [facility] but started drinking again.  - Has remained sober since 11/2020. No change.   5. H/o Cocaine Abuse - No recent use.  - UDS 10/08/20 negative.  6. Tobacco Abuse - Down to 5-10 cigarettes/day. - Working on quitting  7. H/o GIB/ Gastritis w/ ABLA - EGD & colonoscopy 1/23 with 1 polyp removed, otherwise normal studies.  Glori Bickers, MD 10/10/21 10:35 AM

## 2021-10-10 ENCOUNTER — Ambulatory Visit (HOSPITAL_COMMUNITY)
Admission: RE | Admit: 2021-10-10 | Discharge: 2021-10-10 | Disposition: A | Discharge status: Home / Self Care | Payer: BC Managed Care – PPO | Source: Ambulatory Visit | Attending: Internal Medicine | Admitting: Internal Medicine

## 2021-10-10 ENCOUNTER — Encounter (HOSPITAL_COMMUNITY): Payer: Self-pay | Admitting: Internal Medicine

## 2021-10-10 VITALS — BP 108/70 | HR 68 | Wt 188.6 lb

## 2021-10-10 DIAGNOSIS — I5022 Chronic systolic (congestive) heart failure: Secondary | ICD-10-CM | POA: Insufficient documentation

## 2021-10-10 DIAGNOSIS — F17210 Nicotine dependence, cigarettes, uncomplicated: Secondary | ICD-10-CM | POA: Insufficient documentation

## 2021-10-10 DIAGNOSIS — Z720 Tobacco use: Secondary | ICD-10-CM

## 2021-10-10 DIAGNOSIS — I428 Other cardiomyopathies: Secondary | ICD-10-CM | POA: Diagnosis not present

## 2021-10-10 DIAGNOSIS — Z7984 Long term (current) use of oral hypoglycemic drugs: Secondary | ICD-10-CM | POA: Diagnosis not present

## 2021-10-10 DIAGNOSIS — Z801 Family history of malignant neoplasm of trachea, bronchus and lung: Secondary | ICD-10-CM | POA: Diagnosis not present

## 2021-10-10 DIAGNOSIS — Z79899 Other long term (current) drug therapy: Secondary | ICD-10-CM | POA: Insufficient documentation

## 2021-10-10 DIAGNOSIS — F1010 Alcohol abuse, uncomplicated: Secondary | ICD-10-CM

## 2021-10-10 DIAGNOSIS — Z8719 Personal history of other diseases of the digestive system: Secondary | ICD-10-CM | POA: Diagnosis not present

## 2021-10-10 DIAGNOSIS — Z86711 Personal history of pulmonary embolism: Secondary | ICD-10-CM | POA: Insufficient documentation

## 2021-10-10 DIAGNOSIS — F1411 Cocaine abuse, in remission: Secondary | ICD-10-CM | POA: Insufficient documentation

## 2021-10-10 DIAGNOSIS — R42 Dizziness and giddiness: Secondary | ICD-10-CM | POA: Insufficient documentation

## 2021-10-10 DIAGNOSIS — I5082 Biventricular heart failure: Secondary | ICD-10-CM | POA: Diagnosis not present

## 2021-10-10 DIAGNOSIS — I3139 Other pericardial effusion (noninflammatory): Secondary | ICD-10-CM | POA: Insufficient documentation

## 2021-10-10 DIAGNOSIS — Z8249 Family history of ischemic heart disease and other diseases of the circulatory system: Secondary | ICD-10-CM | POA: Diagnosis not present

## 2021-10-10 DIAGNOSIS — F1011 Alcohol abuse, in remission: Secondary | ICD-10-CM | POA: Diagnosis not present

## 2021-10-10 LAB — BASIC METABOLIC PANEL
Anion gap: 6 (ref 5–15)
BUN: 14 mg/dL (ref 6–20)
CO2: 26 mmol/L (ref 22–32)
Calcium: 9.3 mg/dL (ref 8.9–10.3)
Chloride: 105 mmol/L (ref 98–111)
Creatinine, Ser: 0.89 mg/dL (ref 0.61–1.24)
GFR, Estimated: >60 mL/min (ref >60)
Glucose, Bld: 98 mg/dL (ref 70–99)
Potassium: 4.2 mmol/L (ref 3.5–5.1)
Sodium: 137 mmol/L (ref 135–145)

## 2021-10-10 LAB — BRAIN NATRIURETIC PEPTIDE: B Natriuretic Peptide: 25.4 pg/mL (ref 0.0–100.0)

## 2021-10-10 MED ORDER — IVABRADINE HCL 5 MG PO TABS: 5.0000 mg | ORAL_TABLET | Freq: Two times a day (BID) | ORAL | 6 refills | Status: DC

## 2021-10-10 MED ORDER — FUROSEMIDE 20 MG PO TABS: 20.0000 mg | ORAL_TABLET | Freq: Every day | ORAL | 3 refills | Status: DC | PRN

## 2021-10-10 NOTE — Addendum Note (Signed)
Encounter addended by: Scarlette Calico, RN on: 10/10/2021 10:59 AM  Actions taken: Order list changed, Diagnosis association updated, Charge Capture section accepted, Clinical Note Signed

## 2021-10-10 NOTE — Patient Instructions (Addendum)
Medication Changes:  Decrease Corlanor to 5 mg Twice daily   Take Furosemide only AS NEEDED  Lab Work:  Labs done today, your results will be available in MyChart, we will contact you for abnormal readings.  Testing/Procedures:  Your physician has requested that you have an echocardiogram. Echocardiography is a painless test that uses sound waves to create images of your heart. It provides your doctor with information about the size and shape of your heart and how well your heart's chambers and valves are working. This procedure takes approximately one hour. There are no restrictions for this procedure.  Referrals:  None  Special Instructions // Education:  Do the following things EVERYDAY: Weigh yourself in the morning before breakfast. Write it down and keep it in a log. Take your medicines as prescribed Eat low salt foods--Limit salt (sodium) to 2000 mg per day.  Stay as active as you can everyday Limit all fluids for the day to less than 2 liters   Follow-Up in: 3-4 months  At the [facility], you and your health needs are our priority. We have a designated team specialized in the treatment of Heart Failure. This Care Team includes your primary Heart Failure Specialized Cardiologist (physician), Advanced Practice Providers (APPs- Physician Assistants and Nurse Practitioners), and Pharmacist who all work together to provide you with the care you need, when you need it.   You may see any of the following providers on your designated Care Team at your next follow up:  Dr Glori Bickers Dr Haynes Kerns, NP Lyda Jester, [place] [facility] [place], [place] Audry Riles, PharmD   Please be sure to bring in all your medications bottles to every appointment.   Need to Contact us:  If you have any questions or concerns before your next appointment please send [place] a message through [place] or call our office at [phone].    TO LEAVE  A MESSAGE FOR THE NURSE SELECT OPTION 2, PLEASE LEAVE A MESSAGE INCLUDING: YOUR NAME DATE OF BIRTH CALL BACK NUMBER REASON FOR CALL**this is important as we prioritize the call backs  YOU WILL RECEIVE A CALL BACK THE SAME DAY AS LONG AS YOU CALL BEFORE 4:00 PM

## 2021-10-23 ENCOUNTER — Other Ambulatory Visit (HOSPITAL_COMMUNITY): Payer: Self-pay | Admitting: Internal Medicine

## 2022-01-30 ENCOUNTER — Ambulatory Visit (HOSPITAL_COMMUNITY)
Admission: RE | Admit: 2022-01-30 | Discharge: 2022-01-30 | Disposition: A | Discharge status: Home / Self Care | Payer: BC Managed Care – PPO | Source: Ambulatory Visit | Attending: Nurse Practitioner | Admitting: Nurse Practitioner

## 2022-01-30 DIAGNOSIS — I509 Heart failure, unspecified: Secondary | ICD-10-CM | POA: Insufficient documentation

## 2022-01-30 DIAGNOSIS — I5022 Chronic systolic (congestive) heart failure: Secondary | ICD-10-CM | POA: Diagnosis not present

## 2022-01-30 LAB — ECHOCARDIOGRAM COMPLETE
AR max vel: 3.51 cm2
AV Peak grad: 7.7 mmHg
Ao pk vel: 1.39 m/s
Area-P 1/2: 3.39 cm2
S' Lateral: 4.4 cm
Single Plane A4C EF: 42.1 %

## 2022-02-05 ENCOUNTER — Other Ambulatory Visit (HOSPITAL_COMMUNITY): Payer: Self-pay | Admitting: Family Medicine

## 2022-02-06 ENCOUNTER — Encounter (HOSPITAL_COMMUNITY): Payer: BC Managed Care – PPO

## 2022-02-20 ENCOUNTER — Encounter (HOSPITAL_COMMUNITY): Payer: BC Managed Care – PPO

## 2022-04-03 ENCOUNTER — Ambulatory Visit (HOSPITAL_COMMUNITY)
Admission: RE | Admit: 2022-04-03 | Discharge: 2022-04-03 | Disposition: A | Discharge status: Home / Self Care | Payer: BC Managed Care – PPO | Source: Ambulatory Visit | Attending: Cardiology | Admitting: Cardiology

## 2022-04-03 ENCOUNTER — Encounter (HOSPITAL_COMMUNITY): Payer: Self-pay

## 2022-04-03 ENCOUNTER — Inpatient Hospital Stay (HOSPITAL_COMMUNITY)
Admission: RE | Admit: 2022-04-03 | Discharge: 2022-04-03 | Disposition: A | Discharge status: Home / Self Care | Payer: BC Managed Care – PPO | Source: Ambulatory Visit | Attending: Internal Medicine | Admitting: Internal Medicine

## 2022-04-03 VITALS — BP 106/68 | HR 76 | Wt 187.4 lb

## 2022-04-03 DIAGNOSIS — Z79899 Other long term (current) drug therapy: Secondary | ICD-10-CM | POA: Diagnosis not present

## 2022-04-03 DIAGNOSIS — Z8719 Personal history of other diseases of the digestive system: Secondary | ICD-10-CM | POA: Insufficient documentation

## 2022-04-03 DIAGNOSIS — F17210 Nicotine dependence, cigarettes, uncomplicated: Secondary | ICD-10-CM | POA: Diagnosis not present

## 2022-04-03 DIAGNOSIS — Z8249 Family history of ischemic heart disease and other diseases of the circulatory system: Secondary | ICD-10-CM | POA: Insufficient documentation

## 2022-04-03 DIAGNOSIS — R002 Palpitations: Secondary | ICD-10-CM | POA: Diagnosis not present

## 2022-04-03 DIAGNOSIS — Z5941 Food insecurity: Secondary | ICD-10-CM | POA: Insufficient documentation

## 2022-04-03 DIAGNOSIS — F1011 Alcohol abuse, in remission: Secondary | ICD-10-CM | POA: Insufficient documentation

## 2022-04-03 DIAGNOSIS — I428 Other cardiomyopathies: Secondary | ICD-10-CM | POA: Insufficient documentation

## 2022-04-03 DIAGNOSIS — F1411 Cocaine abuse, in remission: Secondary | ICD-10-CM | POA: Insufficient documentation

## 2022-04-03 DIAGNOSIS — Z5986 Financial insecurity: Secondary | ICD-10-CM | POA: Diagnosis not present

## 2022-04-03 DIAGNOSIS — Z86711 Personal history of pulmonary embolism: Secondary | ICD-10-CM | POA: Insufficient documentation

## 2022-04-03 DIAGNOSIS — I5022 Chronic systolic (congestive) heart failure: Secondary | ICD-10-CM | POA: Insufficient documentation

## 2022-04-03 LAB — BASIC METABOLIC PANEL
Anion gap: 11 (ref 5–15)
BUN: 18 mg/dL (ref 6–20)
CO2: 24 mmol/L (ref 22–32)
Calcium: 9.3 mg/dL (ref 8.9–10.3)
Chloride: 103 mmol/L (ref 98–111)
Creatinine, Ser: 0.88 mg/dL (ref 0.61–1.24)
GFR, Estimated: >60 mL/min (ref >60)
Glucose, Bld: 107 mg/dL — ABNORMAL HIGH (ref 70–99)
Potassium: 4.2 mmol/L (ref 3.5–5.1)
Sodium: 138 mmol/L (ref 135–145)

## 2022-04-03 LAB — BRAIN NATRIURETIC PEPTIDE: B Natriuretic Peptide: 15.6 pg/mL (ref 0.0–100.0)

## 2022-04-03 LAB — TSH: TSH: 0.379 u[IU]/mL (ref 0.350–4.500)

## 2022-04-03 LAB — MAGNESIUM: Magnesium: 2 mg/dL (ref 1.7–2.4)

## 2022-04-03 NOTE — Patient Instructions (Signed)
It was great to see you today! No medication changes are needed at this time.  Labs today We will only contact you if something comes back abnormal or we need to make some changes. Otherwise no news is good news!  Your provider has recommended that  you wear a Zio Patch for 14 days.  This monitor will record your heart rhythm for our review.  IF you have any symptoms while wearing the monitor please press the button.  If you have any issues with the patch or you notice a red or orange light on it please call the company at [phone].  Once you remove the patch please mail it back to the company as soon as possible so we can get the results.  Your physician recommends that you schedule a follow-up appointment in: 4-5 weeks  in the Advanced Practitioners (PA/NP) Clinic    Do the following things EVERYDAY: Weigh yourself in the morning before breakfast. Write it down and keep it in a log. Take your medicines as prescribed Eat low salt foods--Limit salt (sodium) to 2000 mg per day.  Stay as active as you can everyday Limit all fluids for the day to less than 2 liters  At the [facility], you and your health needs are our priority. As part of our continuing mission to provide you with exceptional heart care, we have created designated Provider Care Teams. These Care Teams include your primary Cardiologist (physician) and Advanced Practice Providers (APPs- Physician Assistants and Nurse Practitioners) who all work together to provide you with the care you need, when you need it.   You may see any of the following providers on your designated Care Team at your next follow up: Dr Glori Bickers Dr Loralie Champagne Dr. Roxana Hires, NP Lyda Jester, [place] [facility] Elsie, [place] Forestine Na, NP Audry Riles, PharmD   Please be sure to bring in all your medications bottles to every appointment.   If you have any questions or concerns before  your next appointment please send [place] a message through [place] or call our office at [phone].    TO LEAVE A MESSAGE FOR THE NURSE SELECT OPTION 2, PLEASE LEAVE A MESSAGE INCLUDING: YOUR NAME DATE OF BIRTH CALL BACK NUMBER REASON FOR CALL**this is important as we prioritize the call backs  YOU WILL RECEIVE A CALL BACK THE SAME DAY AS LONG AS YOU CALL BEFORE 4:00 PM

## 2022-04-03 NOTE — Progress Notes (Addendum)
ADVANCED HF CLINIC NOTE  PCP: [name] NP Primary HF Cardiologist: Dr Haroldine Laws    Reason for Visit/CC: Follow up for Chronic Systolic Heart Failure ; Palpitations   HPI: Robert [name] is a 41 y.o. male with h/o heavy ETOH and cocaine use, PE,  biventricular systolic heart failure and tobacco abuse.   Admitted with acute onset biventricular HF in 5/21. EF 15%. He was drinking at least 2 beers every night and about 12-24 over the weekend. Also drinking 1/5 of liqour every other day. Cath showed normal coronaries. Elevated filling pressures with normal output. Required milrinone. 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 CT showed small LLL PE. Placed on xarelto. Started on HF meds.  Discharge weight 178 pounds.   Readmitted 6/21 for mild acute blood loss related anemia. EGD showed no active bleeding but evidence of some gastritis. PPI increased. Repeat EGD/colonoscopy 1/23 had 1 small polyp removed, normal esophagus.  Has had numerous ED visits and admits for ETOH withdrawal and AKI. Reports stopping ETOH in 7/22  Echo 10/21 EF 30-35% mild RV HK.  Echo 3/23 EF 40-45%   Last visit 5/23, he was doing well. Working at [employer]. NYHA Class I. Denied ETOH use. Compliant with heart meds, and reported some orthostasis. Had lost 15 lb after dietary changes. Lasix was switched from daily to PRN. Instructed to return back in 6 months w/ repeat echo. Also referred for gene testing given concern for noncompaction.   Echo 9/23 EF 40-45%, RV normal.  Presents today for follow-up. Reports episodes of tachy palpitations and dizziness w/ occasional dyspnea. He says they feel like panic attacks. EKG obtained today, NSR + inferior and anterolateral TWIs also seen on prior EKGs. Unchanged. HR 66 bpm.   BP 106/68. ReDs 33%. + occasional orthostatic symptoms. NYHA Class I-II.   Report compliance w/ meds, though admits that he sometimes will forget to take evening doses. This is not  frequent. Denies further ETOH use. Still smoking cigarettes, < 1/4 ppd.   Of note, I reviewed gene test results. 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   Needs new PCP.      Cardiac studies. Echo (5/21): EF <20%  Prominent trabeculation. Grade III DD  R/LHC (5/21): showed normal coronaries, elevated filling pressures with normal output.  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 No tamponade physiology.  Echo (10/21): EF 30-35% RV normal. No effusion.  Echo (2/22): EF 30% Echo (9/23): EF 40-45%, RV normal   ROS: All systems negative except as listed in HPI, PMH and Problem List.  SH:  Social History   Socioeconomic History   Marital status: Single    Spouse name: Not on file   Number of children: Not on file   Years of education: Not on file   Highest education level: Not on file  Occupational History   Occupation: [employer]  Tobacco Use   Smoking status: Every Day    Packs/day: 0.25    Years: 24.00    Total pack years: 6.00    Types: Cigarettes   Smokeless tobacco: Never  Vaping Use   Vaping Use: Former  Substance and Sexual Activity   Alcohol use: Yes    Alcohol/week: 8.0 standard drinks of alcohol    Types: 8 Shots of  liquor per week    Comment: heavy use since age 85   Drug use: Yes    Frequency: 1.0 times per week    Types: Marijuana   Sexual activity: Yes  Other Topics Concern   Not on file  Social History Narrative   Not on file   Social Determinants of Health   Financial Resource Strain: High Risk (11/03/2019)   Overall Financial Resource Strain (CARDIA)    Difficulty of Paying Living Expenses: Very hard  Food Insecurity: Food Insecurity Present (11/03/2019)   Hunger Vital Sign    Worried About Running Out of Food in the Last Year: Sometimes true    Ran Out of Food in the Last Year:  Sometimes true  Transportation Needs: Not on file  Physical Activity: Not on file  Stress: Not on file  Social Connections: Not on file  Intimate Partner Violence: Not on file   FH:  Family History  Problem Relation Age of Onset   Heart failure Father    Diabetes Father    Kidney disease Father    Heart disease Father 88   Heart disease Maternal Grandfather 60   Heart attack Maternal Grandfather    Heart disease Paternal Grandfather 16   Lung cancer Maternal Aunt    Past Medical History:  Diagnosis Date   AKI (acute kidney injury) ([place]) 10/30/2019   Alcohol use    Alcohol withdrawal ([place]) 03/06/2020   Anxiety    Asthma    CHF (congestive heart failure) (HCC)    Depression    GERD (gastroesophageal reflux disease)    Pulmonary embolism (HCC)    Seizure (HCC)    Current Outpatient Medications  Medication Sig Dispense Refill   carvedilol (COREG) 3.125 MG tablet Take 1 tablet by mouth twice daily 180 tablet 3   FARXIGA 10 MG TABS tablet TAKE 1 TABLET BY MOUTH ONCE DAILY BEFORE BREAKFAST 90 tablet 3   folic acid (FOLVITE) 1 MG tablet Take 1 tablet (1 mg total) by mouth daily. 30 tablet 3   furosemide (LASIX) 20 MG tablet Take 1 tablet (20 mg total) by mouth daily as needed. 30 tablet 3   ivabradine (CORLANOR) 5 MG TABS tablet Take 1 tablet (5 mg total) by mouth 2 (two) times daily with a meal. 60 tablet 6   Menthol, Topical Analgesic, (BIOFREEZE EX) Apply 1 application topically daily as needed (pain).     Naphazoline HCl (CLEAR EYES OP) Place 1 drop into both eyes daily as needed (redness/ irritation).     nicotine (NICODERM CQ - DOSED IN MG/24 HOURS) 21 mg/24hr patch Place 21 mg onto the skin daily as needed (nicotine dependence).     pantoprazole (PROTONIX) 40 MG tablet Take 1 tablet (40 mg total) by mouth 2 (two) times daily. 180 tablet 3   sacubitril-valsartan (ENTRESTO) 97-103 MG Take 1 tablet by mouth 2 (two) times daily. 180 tablet 1   spironolactone (ALDACTONE) 25 MG  tablet Take 0.5 tablets (12.5 mg total) by mouth daily. 90 tablet 0   No current facility-administered medications for this encounter.   Wt Readings from Last 3 Encounters:  04/03/22 85 kg (187 lb 6.4 oz)  10/10/21 85.5 kg (188 lb 9.6 oz)  07/03/21 92.4 kg (203 lb 9.6 oz)   BP 106/68   Pulse 76   Wt 85 kg (187 lb 6.4 oz)   SpO2 99%   BMI 26.89 kg/m   PHYSICAL EXAM: ReDs 33%  General:  Well appearing. No resp difficulty  HEENT: normal Neck: supple. no JVD. Carotids 2+ bilat; no bruits. No lymphadenopathy or thryomegaly appreciated. Cor: PMI nondisplaced. Regular rate & rhythm. No rubs, gallops or murmurs. Lungs: clear Abdomen: soft, nontender, nondistended. No hepatosplenomegaly. No bruits or masses. Good bowel sounds. Extremities: no cyanosis, clubbing, rash, edema Neuro: alert & orientedx3, cranial nerves grossly intact. moves all 4 extremities w/o difficulty. Affect pleasant  ASSESSMENT & PLAN: 1. Chronic Systolic Heart Failure, NICM  - Echo (5/21): EF 15% RV moderately reduced. Suspect ETOH and cocaine playing a role.  - LHC (5/21): normal cors. 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  - Echo (10/21): EF 30-35% RV normal. No effusion.  - Echo (2/22): EF 30%  - Echo 3/23 EF 40-45%  - Echo 9/23 EF 40-45%, RV normal - NYHA I-II. Euvolemic on exam.  Continue Lasix 20 mg PRN  - BP soft. Having some orthostasis. Will not titrate GDMT today  - Continue spiro 12.5 mg daily. - Continue Entresto 97-103 mg bid.  - Continue Coreg 3.125 mg bid.  - Continue Farxiga 10 mg daily. - Continue Corlanor 5 mg bid - Referred for genetic testing. 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 Will refer to Dr. Broadus John (d/w patient at next visit).  - BMP/BNP today    2. H/o Syncope - 07/10/20. - denies further recurrence   3. H/o Pulmonary Emboli - CTA w/ small LLL PE  5/21. - LE Venous dopplers negative for DVT.  - Given small PE. Risk of ongoing AC likely outweighs benefit. - Off Xarelto  4. ETOH Abuse - Heavy drinker for many years since the age of 61.  - 01/2020 had 28 day detox [facility] but started drinking again.  - Has remained sober since 11/2020. No change.   5. H/o Cocaine Abuse - denies recent use.  - UDS 10/08/20 negative.  6. Tobacco Abuse - Down to 5-10 cigarettes/day. - Working on quitting  7. H/o GIB/ Gastritis w/ ABLA - EGD & colonoscopy 1/23 with 1 polyp removed, otherwise normal studies.  8. Palpitations/Dyspnea - episodes occur frequently, he thinks panic attacks - given cardiac history, will place 2 wk Zio to r/o arrhthymias - check TSH, BMP and Mg level today  - may be due to anxiety/panic attacks. Dicussed need to see PCP. Pt provided list of new providers accepting patients  F/u w/ APP in 1 month   Robert [name] Rosita Fire, [place] 04/03/22 1:44 PM

## 2022-04-03 NOTE — Addendum Note (Signed)
Encounter addended by: Consuelo Pandy, PA-C on: 04/03/2022 2:34 PM  Actions taken: Clinical Note Signed

## 2022-04-03 NOTE — Progress Notes (Signed)
ReDS Vest / Clip - 04/03/22 1400       ReDS Vest / Clip   Station Marker C    Ruler Value 30    ReDS Value Range Low volume    ReDS Actual Value 33

## 2022-04-03 NOTE — Addendum Note (Signed)
Encounter addended by: Kerry Dory, CMA on: 04/03/2022 2:33 PM  Actions taken: Order list changed, Diagnosis association updated

## 2022-04-22 ENCOUNTER — Telehealth (HOSPITAL_COMMUNITY): Payer: Self-pay

## 2022-04-22 ENCOUNTER — Other Ambulatory Visit (HOSPITAL_COMMUNITY): Payer: Self-pay

## 2022-04-22 NOTE — Telephone Encounter (Signed)
Advanced Heart Failure Patient Advocate Encounter  Prior authorization is required for Corlanor. PA submitted and APPROVED on 04/22/22.  Key [id number] Effective: 04/22/22 - 04/23/23  Clista Bernhardt, CPhT Rx Patient Advocate Phone: [phone]

## 2022-04-25 ENCOUNTER — Ambulatory Visit
Admission: EM | Admit: 2022-04-25 | Discharge: 2022-04-25 | Disposition: A | Discharge status: Home / Self Care | Payer: BC Managed Care – PPO | Attending: Urgent Care | Admitting: Urgent Care

## 2022-04-25 ENCOUNTER — Ambulatory Visit (INDEPENDENT_AMBULATORY_CARE_PROVIDER_SITE_OTHER): Payer: BC Managed Care – PPO

## 2022-04-25 DIAGNOSIS — F17200 Nicotine dependence, unspecified, uncomplicated: Secondary | ICD-10-CM | POA: Insufficient documentation

## 2022-04-25 DIAGNOSIS — Z1152 Encounter for screening for COVID-19: Secondary | ICD-10-CM | POA: Insufficient documentation

## 2022-04-25 DIAGNOSIS — I509 Heart failure, unspecified: Secondary | ICD-10-CM | POA: Insufficient documentation

## 2022-04-25 DIAGNOSIS — Z86711 Personal history of pulmonary embolism: Secondary | ICD-10-CM | POA: Insufficient documentation

## 2022-04-25 DIAGNOSIS — Z79899 Other long term (current) drug therapy: Secondary | ICD-10-CM | POA: Insufficient documentation

## 2022-04-25 DIAGNOSIS — B349 Viral infection, unspecified: Secondary | ICD-10-CM | POA: Diagnosis not present

## 2022-04-25 DIAGNOSIS — Z7952 Long term (current) use of systemic steroids: Secondary | ICD-10-CM | POA: Diagnosis not present

## 2022-04-25 DIAGNOSIS — J029 Acute pharyngitis, unspecified: Secondary | ICD-10-CM | POA: Diagnosis not present

## 2022-04-25 DIAGNOSIS — R059 Cough, unspecified: Secondary | ICD-10-CM | POA: Diagnosis not present

## 2022-04-25 MED ORDER — CETIRIZINE HCL 10 MG PO TABS: 10.0000 mg | ORAL_TABLET | Freq: Every day | ORAL | 0 refills | Status: DC

## 2022-04-25 MED ORDER — PROMETHAZINE-DM 6.25-15 MG/5ML PO SYRP
2.5000 mL | ORAL_SOLUTION | Freq: Three times a day (TID) | ORAL | 0 refills | Status: DC | PRN
Start: 2022-04-25 — End: 2022-09-06

## 2022-04-25 MED ORDER — PREDNISONE 10 MG PO TABS: 30.0000 mg | ORAL_TABLET | Freq: Every day | ORAL | 0 refills | Status: DC

## 2022-04-25 NOTE — ED Triage Notes (Signed)
Pt c/o cough, runny nose, sore throat x 4 days-NAD-steady gait

## 2022-04-25 NOTE — ED Provider Notes (Signed)
[name] - URGENT [facility]ARE [facility]  Note:  This document was prepared using Systems analyst and may include unintentional dictation errors.  MRN: [id number] DOB: 06-07-1980  Subjective:   Robert [name] is a 41 y.o. male presenting for 4-day history of acute onset runny and stuffy nose, drainage, throat pain, coughing and chest pressure, subjective fever.  No overt chest pain, shortness of breath, wheezing.  Has a history of chronic heart failure, last echocardiogram from 01/30/2022 showed an ejection fraction of 40% to 45%.  Also has a history of pulmonary embolism was discontinued off of his anticoagulants.  Smokes 1/4 pack/day.  No history of asthma.  No current drug use.  No current facility-administered medications for this encounter.  Current Outpatient Medications:    carvedilol (COREG) 3.125 MG tablet, Take 1 tablet by mouth twice daily, Disp: 180 tablet, Rfl: 3   FARXIGA 10 MG TABS tablet, TAKE 1 TABLET BY MOUTH ONCE DAILY BEFORE BREAKFAST, Disp: 90 tablet, Rfl: 3   folic acid (FOLVITE) 1 MG tablet, Take 1 tablet (1 mg total) by mouth daily., Disp: 30 tablet, Rfl: 3   furosemide (LASIX) 20 MG tablet, Take 1 tablet (20 mg total) by mouth daily as needed., Disp: 30 tablet, Rfl: 3   ivabradine (CORLANOR) 5 MG TABS tablet, Take 1 tablet (5 mg total) by mouth 2 (two) times daily with a meal., Disp: 60 tablet, Rfl: 6   Menthol, Topical Analgesic, (BIOFREEZE EX), Apply 1 application topically daily as needed (pain)., Disp: , Rfl:    Naphazoline HCl (CLEAR EYES OP), Place 1 drop into both eyes daily as needed (redness/ irritation)., Disp: , Rfl:    nicotine (NICODERM CQ - DOSED IN MG/24 HOURS) 21 mg/24hr patch, Place 21 mg onto the skin daily as needed (nicotine dependence)., Disp: , Rfl:    pantoprazole (PROTONIX) 40 MG tablet, Take 1 tablet (40 mg total) by mouth 2 (two) times daily., Disp: 180 tablet, Rfl: 3   sacubitril-valsartan (ENTRESTO) 97-103 MG, Take 1 tablet  by mouth 2 (two) times daily., Disp: 180 tablet, Rfl: 1   spironolactone (ALDACTONE) 25 MG tablet, Take 0.5 tablets (12.5 mg total) by mouth daily., Disp: 90 tablet, Rfl: 0   Allergies  Allergen Reactions   Bactrim [Sulfamethoxazole-Trimethoprim] Rash    Past Medical History:  Diagnosis Date   AKI (acute kidney injury) ([place]) 10/30/2019   Alcohol use    Alcohol withdrawal ([place]) 03/06/2020   Anxiety    Asthma    CHF (congestive heart failure) (HCC)    Depression    GERD (gastroesophageal reflux disease)    Pulmonary embolism ([place])    Seizure ([place])      Past Surgical History:  Procedure Laterality Date   BIOPSY  05/31/2021   Procedure: BIOPSY;  Surgeon: Thornton Park, MD;  Location: [facility] ENDOSCOPY;  Service: Gastroenterology;;   COLONOSCOPY WITH PROPOFOL N/A 05/31/2021   Procedure: COLONOSCOPY WITH PROPOFOL;  Surgeon: Thornton Park, MD;  Location: [facility];  Service: Gastroenterology;  Laterality: N/A;   ESOPHAGOGASTRODUODENOSCOPY (EGD) WITH PROPOFOL N/A 11/01/2019   Procedure: ESOPHAGOGASTRODUODENOSCOPY (EGD) WITH PROPOFOL;  Surgeon: [name], MD;  Location[facility] ENDOSCOPY;  Service: Endoscopy;  Laterality: N/A;   ESOPHAGOGASTRODUODENOSCOPY (EGD) WITH PROPOFOL N/A 05/31/2021   Procedure: ESOPHAGOGASTRODUODENOSCOPY (EGD) WITH PROPOFOL;  Surgeon: Thornton Park, MD;  Location: [facility];  Service: Gastroenterology;  Laterality: N/A;   POLYPECTOMY  05/31/2021   Procedure: POLYPECTOMY;  Surgeon: Thornton Park, MD;  Location: [facility];  Service: Gastroenterology;;  RIGHT/LEFT HEART CATH AND CORONARY ANGIOGRAPHY N/A 10/06/2019   Procedure: RIGHT/LEFT HEART CATH AND CORONARY ANGIOGRAPHY;  Surgeon: [name], MD;  Location: [facility] CV LAB;  Service: Cardiovascular;  Laterality: N/A;   WRIST SURGERY Right    age17    Family History  Problem Relation Age of Onset   Heart failure Father    Diabetes Father    Kidney disease Father    Heart disease  Father 61   Heart disease Maternal Grandfather 6   Heart attack Maternal Grandfather    Heart disease Paternal Grandfather 22   Lung cancer Maternal Aunt     Social History   Tobacco Use   Smoking status: Every Day    Packs/day: 0.25    Years: 24.00    Total pack years: 6.00    Types: Cigarettes   Smokeless tobacco: Never  Vaping Use   Vaping Use: Former  Substance Use Topics   Alcohol use: Not Currently    Alcohol/week: 8.0 standard drinks of alcohol    Types: 8 Shots of liquor per week   Drug use: Not Currently    ROS   Objective:   Vitals: BP 122/72 (BP Location: Left Arm)   Pulse 80   Temp 99.5 F (37.5 C) (Oral)   Resp 20   SpO2 95%   Physical Exam Constitutional:      General: He is not in acute distress.    Appearance: Normal appearance. He is well-developed and normal weight. He is not ill-appearing, toxic-appearing or diaphoretic.  HENT:     Head: Normocephalic and atraumatic.     Right Ear: Tympanic membrane, ear canal and external ear normal. No drainage, swelling or tenderness. No middle ear effusion. There is no impacted cerumen. Tympanic membrane is not erythematous or bulging.     Left Ear: Tympanic membrane, ear canal and external ear normal. No drainage, swelling or tenderness.  No middle ear effusion. There is no impacted cerumen. Tympanic membrane is not erythematous or bulging.     Nose: Nose normal. No congestion or rhinorrhea.     Mouth/Throat:     Mouth: Mucous membranes are moist.     Pharynx: No pharyngeal swelling, oropharyngeal exudate, posterior oropharyngeal erythema or uvula swelling.     Tonsils: No tonsillar exudate or tonsillar abscesses. 0 on the right. 0 on the left.  Eyes:     General: No scleral icterus.       Right eye: No discharge.        Left eye: No discharge.     Extraocular Movements: Extraocular movements intact.     Conjunctiva/sclera: Conjunctivae normal.  Cardiovascular:     Rate and Rhythm: Normal rate and  regular rhythm.     Heart sounds: Normal heart sounds. No murmur heard.    No friction rub. No gallop.  Pulmonary:     Effort: Pulmonary effort is normal. No respiratory distress.     Breath sounds: Normal breath sounds. No stridor. No wheezing, rhonchi or rales.  Musculoskeletal:     Cervical back: Normal range of motion and neck supple. No rigidity. No muscular tenderness.  Neurological:     General: No focal deficit present.     Mental Status: He is alert and oriented to person, place, and time.  Psychiatric:        Mood and Affect: Mood normal.        Behavior: Behavior normal.        Thought Content: Thought content normal.  Judgment: Judgment normal.    DG Chest 2 View  Result Date: 04/25/2022 CLINICAL DATA:  Cough, runny nose, sore throat. EXAM: CHEST - 2 VIEW COMPARISON:  Chest radiograph 11/19/2020 FINDINGS: The cardiomediastinal silhouette is stable. There is no focal consolidation or pulmonary edema. There is no pleural effusion or pneumothorax Mild anterior wedge deformity of a midthoracic vertebral body is unchanged. There is no acute osseous abnormality. IMPRESSION: No radiographic evidence of acute cardiopulmonary process. Electronically Signed   By: Valetta Mole M.D.   On: 04/25/2022 11:50    Recent Results (from the past 2160 hour(s))  ECHOCARDIOGRAM COMPLETE     Status: None   Collection Time: 01/30/22 10:37 AM  Result Value Ref Range   S' Lateral 4.40 cm   Single Plane A4C EF 42.1 %   Area-P 1/2 3.39 cm2   AR max vel 3.51 cm2   AV Peak grad 7.7 mmHg   Ao pk vel 1.39 m/s  Basic metabolic panel     Status: Abnormal   Collection Time: 04/03/22  2:13 PM  Result Value Ref Range   Sodium 138 135 - 145 mmol/L   Potassium 4.2 3.5 - 5.1 mmol/L   Chloride 103 98 - 111 mmol/L   CO2 24 22 - 32 mmol/L   Glucose, Bld 107 (H) 70 - 99 mg/dL    Comment: Glucose reference range applies only to samples taken after fasting for at least 8 hours.   BUN 18 6 - 20 mg/dL    Creatinine, Ser 0.88 0.61 - 1.24 mg/dL   Calcium 9.3 8.9 - 10.3 mg/dL   GFR, Estimated >60 >60 mL/min    Comment: (NOTE) Calculated using the CKD-EPI Creatinine Equation (2021)    Anion gap 11 5 - 15    Comment: Performed at [facility] [address] [place], [place] [address]  Brain natriuretic peptide     Status: None   Collection Time: 04/03/22  2:13 PM  Result Value Ref Range   B Natriuretic Peptide 15.6 0.0 - 100.0 pg/mL    Comment: Performed at [facility] [address], [place], [place] [address]  Magnesium     Status: None   Collection Time: 04/03/22  2:13 PM  Result Value Ref Range   Magnesium 2.0 1.7 - 2.4 mg/dL    Comment: Performed at [facility] [address] [place], [place] [address]  TSH     Status: None   Collection Time: 04/03/22  2:13 PM  Result Value Ref Range   TSH 0.379 0.350 - 4.500 uIU/mL    Comment: Performed by a 3rd Generation assay with a functional sensitivity of <=0.01 uIU/mL. Performed at [facility], [place] [address] [place],  [address]      Assessment and Plan :   PDMP not reviewed this encounter.  1. Acute viral syndrome   2. Chronic congestive heart failure, unspecified heart failure type ([place])   3. History of pulmonary embolism   4. Smoker     Chest x-ray was negative.  Recommended a lower dose of prednisone in the context of his smoking and respiratory symptoms and at the patient's request to help him with his chest discomfort, coughing.    COVID and flu test pending.  We will otherwise manage for viral upper respiratory infection.  Physical exam findings reassuring and vital signs stable for discharge. Advised supportive care, offered symptomatic relief. Counseled patient on potential for adverse effects with medications prescribed/recommended  today, ER and return-to-clinic precautions discussed, patient verbalized understanding.    Patient will be a good candidate to undergo Paxlovid should he  test positive for COVID-19.  Last GFR was greater than 60.   Jaynee Eagles, PA-C 04/25/22 1301

## 2022-04-26 LAB — SARS CORONAVIRUS 2 (TAT 6-24 HRS): SARS Coronavirus 2: NEGATIVE

## 2022-04-29 NOTE — Progress Notes (Signed)
[facility]                 PCP: [name] NP HF Cardiologist: Dr. Haroldine Laws    Reason for Visit: Follow up for Chronic Systolic Heart Failure  HPI: Robert [name] is a 41 y.o. male with h/o heavy ETOH and cocaine use, PE,  biventricular systolic heart failure and tobacco abuse.   Admitted with acute onset biventricular HF in 5/21. EF 15%. He was drinking at least 2 beers every night and about 12-24 over the weekend. Also drinking 1/5 of liqour every other day. Cath showed normal coronaries. Elevated filling pressures with normal output. Required milrinone. 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 CT showed small LLL PE. Placed on xarelto. Started on HF meds.  Discharge weight 178 pounds.   Readmitted 6/21 for mild acute blood loss related anemia. EGD showed no active bleeding but evidence of some gastritis. PPI increased. Repeat EGD/colonoscopy 1/23 had 1 small polyp removed, normal esophagus.  Has had numerous ED visits and admits for ETOH withdrawal and AKI. Reports stopping ETOH in 7/22  Echo 10/21 EF 30-35% mild RV HK.  Echo 3/23 EF 40-45%   Follow up 5/23, lasix switched form daily to PRN.  Echo 9/23 EF 40-45%, RV normal.  Follow up 11/23, Zio placed for palpitations. 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   Today he returns for HF follow up. Overall feeling fine. He works full time and no SOB with activity. Denies palpitations, CP, dizziness, edema, or PND/Orthopnea. Appetite ok. No fever or chills. Weight at home 180 pounds. Taking all medications. No ETOH. Has not needed Lasix recently. Smokes 2-5 cigs/day.  Cardiac studies - Echo (5/21): EF <20%  Prominent trabeculation. Grade III DD   - R/LHC (5/21): showed normal coronaries, elevated filling pressures with normal output.   - cMRI (5/21): showed Noncompaction- LVEF 14% RVEF 16%  Severely dialted R/L atrium  - Echo (6/21):  Pericardial effusion remains moderate in size. No tamponade physiology.   - Echo (10/21): EF 30-35% RV normal. No effusion.   - Echo (2/22): EF 30%  - Echo (9/23): EF 40-45%, RV normal   ROS: All systems negative except as listed in HPI, PMH and Problem List.  SH:  Social History   Socioeconomic History   Marital status: Single    Spouse name: Not on file   Number of children: Not on file   Years of education: Not on file   Highest education level: Not on file  Occupational History   Occupation: [employer]  Tobacco Use   Smoking status: Every Day    Packs/day: 0.25    Years: 24.00    Total pack years: 6.00    Types: Cigarettes   Smokeless tobacco: Never  Vaping Use   Vaping Use: Former  Substance and Sexual Activity   Alcohol use: Not Currently    Alcohol/week: 8.0 standard drinks of alcohol    Types: 8 Shots of liquor per week   Drug use: Not Currently   Sexual activity: Yes  Other Topics Concern   Not on file  Social History Narrative   Not on file   Social Determinants of Health   Financial Resource Strain: High Risk (11/03/2019)   Overall Financial Resource Strain (CARDIA)    Difficulty of Paying Living Expenses: Very hard  Food Insecurity: Food Insecurity Present (11/03/2019)   Hunger Vital Sign    Worried About  Running Out of Food in the Last Year: Sometimes true    Ran Out of Food in the Last Year: Sometimes true  Transportation Needs: Not on file  Physical Activity: Not on file  Stress: Not on file  Social Connections: Not on file  Intimate Partner Violence: Not on file   FH:  Family History  Problem Relation Age of Onset   Heart failure Father    Diabetes Father    Kidney disease Father    Heart disease Father 53   Heart disease Maternal Grandfather 50   Heart attack Maternal Grandfather    Heart disease Paternal Grandfather 30   Lung cancer Maternal Aunt    Past Medical History:  Diagnosis Date   AKI (acute kidney injury) ([place])  10/30/2019   Alcohol use    Alcohol withdrawal ([place]) 03/06/2020   Anxiety    Asthma    CHF (congestive heart failure) (HCC)    Depression    GERD (gastroesophageal reflux disease)    Pulmonary embolism (HCC)    Seizure (HCC)    Current Outpatient Medications  Medication Sig Dispense Refill   carvedilol (COREG) 3.125 MG tablet Take 1 tablet by mouth twice daily 180 tablet 3   cetirizine (ZYRTEC ALLERGY) 10 MG tablet Take 1 tablet (10 mg total) by mouth daily. 30 tablet 0   FARXIGA 10 MG TABS tablet TAKE 1 TABLET BY MOUTH ONCE DAILY BEFORE BREAKFAST 90 tablet 3   folic acid (FOLVITE) 1 MG tablet Take 1 tablet (1 mg total) by mouth daily. 30 tablet 3   furosemide (LASIX) 20 MG tablet Take 1 tablet (20 mg total) by mouth daily as needed. 30 tablet 3   ivabradine (CORLANOR) 5 MG TABS tablet Take 1 tablet (5 mg total) by mouth 2 (two) times daily with a meal. 60 tablet 6   Menthol, Topical Analgesic, (BIOFREEZE EX) Apply 1 application topically daily as needed (pain).     Naphazoline HCl (CLEAR EYES OP) Place 1 drop into both eyes daily as needed (redness/ irritation).     nicotine (NICODERM CQ - DOSED IN MG/24 HOURS) 21 mg/24hr patch Place 21 mg onto the skin daily as needed (nicotine dependence).     pantoprazole (PROTONIX) 40 MG tablet Take 1 tablet (40 mg total) by mouth 2 (two) times daily. 180 tablet 3   predniSONE (DELTASONE) 10 MG tablet Take 3 tablets (30 mg total) by mouth daily with breakfast. 15 tablet 0   promethazine-dextromethorphan (PROMETHAZINE-DM) 6.25-15 MG/5ML syrup Take 2.5 mLs by mouth 3 (three) times daily as needed for cough. 100 mL 0   sacubitril-valsartan (ENTRESTO) 97-103 MG Take 1 tablet by mouth 2 (two) times daily. 180 tablet 1   spironolactone (ALDACTONE) 25 MG tablet Take 0.5 tablets (12.5 mg total) by mouth daily. 90 tablet 0   No current facility-administered medications for this encounter.   Wt Readings from Last 3 Encounters:  05/01/22 83.4 kg (183 lb 12.8  oz)  04/03/22 85 kg (187 lb 6.4 oz)  10/10/21 85.5 kg (188 lb 9.6 oz)   BP 132/88   Pulse 71   Wt 83.4 kg (183 lb 12.8 oz)   SpO2 98%   BMI 26.37 kg/m   PHYSICAL EXAM: General:  NAD. No resp difficulty, walked into clinic HEENT: Normal Neck: Supple. No JVD. Carotids 2+ bilat; no bruits. No lymphadenopathy or thryomegaly appreciated. Cor: PMI nondisplaced. Regular rate & rhythm. No rubs, gallops or murmurs. Lungs: Clear Abdomen: Soft, nontender, nondistended. No hepatosplenomegaly. No bruits  or masses. Good bowel sounds. Extremities: No cyanosis, clubbing, rash, edema Neuro: Alert & oriented x 3, cranial nerves grossly intact. Moves all 4 extremities w/o difficulty. Flat affect.  ASSESSMENT & PLAN: 1. Chronic Systolic Heart Failure, NICM  - Echo (5/21): EF 15% RV moderately reduced. Suspect ETOH and cocaine playing a role.  - LHC (5/21): normal cors. 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  - Echo (10/21): EF 30-35% RV normal. No effusion.  - Echo (2/22): EF 30%  - Echo 3/23 EF 40-45%  - Echo 9/23 EF 40-45%, RV normal - NYHA I-II. Euvolemic on exam.   - Continue Lasix 20 mg PRN  - Continue spiro 12.5 mg daily. - Continue Entresto 97-103 mg bid.  - Continue Coreg 3.125 mg bid.  - Continue Farxiga 10 mg daily. - Continue Corlanor 5 mg bid. HR 71 today. - Referred for genetic counseling with Dr. Broadus John. 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 Discussed with patient today. - BMP/BNP today   2. H/o Syncope - 07/10/20. - Denies further recurrence   3. H/o Pulmonary Emboli - CTA w/ small LLL PE 5/21. - LE Venous dopplers negative for DVT.  - Given small PE. Risk of ongoing AC likely outweighs benefit. - Off Xarelto  4. ETOH Abuse - Heavy drinker for many years since the age of 76.  - 01/2020 had 28 day detox [facility] but started drinking again.   - Has remained sober since 11/2020. No change.   5. H/o Cocaine Abuse - Denies recent use.  - UDS 10/08/20 negative.  6. Tobacco Abuse - Down to 2-5 cig/daily. - Working on quitting.  7. H/o GIB/ Gastritis w/ ABLA - EGD & colonoscopy 1/23 with 1 polyp removed, otherwise normal studies.  8. Palpitations  - Resolved. - I asked he mail his Zio back today  Follow up in 4 months with Dr. Melynda Keller, FNP 05/01/22 10:47 AM

## 2022-05-01 ENCOUNTER — Encounter (HOSPITAL_COMMUNITY): Payer: Self-pay

## 2022-05-01 ENCOUNTER — Ambulatory Visit (HOSPITAL_COMMUNITY)
Admission: RE | Admit: 2022-05-01 | Discharge: 2022-05-01 | Disposition: A | Discharge status: Home / Self Care | Payer: BC Managed Care – PPO | Source: Ambulatory Visit | Attending: Family Medicine | Admitting: Family Medicine

## 2022-05-01 VITALS — BP 132/88 | HR 71 | Wt 183.8 lb

## 2022-05-01 DIAGNOSIS — I5022 Chronic systolic (congestive) heart failure: Secondary | ICD-10-CM | POA: Diagnosis not present

## 2022-05-01 DIAGNOSIS — I5082 Biventricular heart failure: Secondary | ICD-10-CM | POA: Insufficient documentation

## 2022-05-01 DIAGNOSIS — F1411 Cocaine abuse, in remission: Secondary | ICD-10-CM | POA: Insufficient documentation

## 2022-05-01 DIAGNOSIS — Z720 Tobacco use: Secondary | ICD-10-CM

## 2022-05-01 DIAGNOSIS — Z5986 Financial insecurity: Secondary | ICD-10-CM | POA: Diagnosis not present

## 2022-05-01 DIAGNOSIS — F17210 Nicotine dependence, cigarettes, uncomplicated: Secondary | ICD-10-CM | POA: Insufficient documentation

## 2022-05-01 DIAGNOSIS — Z8249 Family history of ischemic heart disease and other diseases of the circulatory system: Secondary | ICD-10-CM | POA: Diagnosis not present

## 2022-05-01 DIAGNOSIS — Z86711 Personal history of pulmonary embolism: Secondary | ICD-10-CM | POA: Diagnosis not present

## 2022-05-01 DIAGNOSIS — Z5941 Food insecurity: Secondary | ICD-10-CM | POA: Diagnosis not present

## 2022-05-01 DIAGNOSIS — I428 Other cardiomyopathies: Secondary | ICD-10-CM | POA: Diagnosis not present

## 2022-05-01 DIAGNOSIS — Z8719 Personal history of other diseases of the digestive system: Secondary | ICD-10-CM

## 2022-05-01 DIAGNOSIS — R55 Syncope and collapse: Secondary | ICD-10-CM

## 2022-05-01 DIAGNOSIS — Z79899 Other long term (current) drug therapy: Secondary | ICD-10-CM | POA: Insufficient documentation

## 2022-05-01 DIAGNOSIS — Z7901 Long term (current) use of anticoagulants: Secondary | ICD-10-CM | POA: Diagnosis not present

## 2022-05-01 DIAGNOSIS — R002 Palpitations: Secondary | ICD-10-CM | POA: Diagnosis not present

## 2022-05-01 DIAGNOSIS — F1010 Alcohol abuse, uncomplicated: Secondary | ICD-10-CM

## 2022-05-01 DIAGNOSIS — I2699 Other pulmonary embolism without acute cor pulmonale: Secondary | ICD-10-CM

## 2022-05-01 DIAGNOSIS — F1410 Cocaine abuse, uncomplicated: Secondary | ICD-10-CM

## 2022-05-01 NOTE — Patient Instructions (Signed)
It was great to see you today! No medication changes are needed at this time.  Your physician recommends that you schedule a follow-up appointment in: 4 months with Dr Haroldine Laws  Do the following things EVERYDAY: Weigh yourself in the morning before breakfast. Write it down and keep it in a log. Take your medicines as prescribed Eat low salt foods--Limit salt (sodium) to 2000 mg per day.  Stay as active as you can everyday Limit all fluids for the day to less than 2 liters  At the [facility], you and your health needs are our priority. As part of our continuing mission to provide you with exceptional heart care, we have created designated Provider Care Teams. These Care Teams include your primary Cardiologist (physician) and Advanced Practice Providers (APPs- Physician Assistants and Nurse Practitioners) who all work together to provide you with the care you need, when you need it.   You may see any of the following providers on your designated Care Team at your next follow up: Dr Glori Bickers Dr Loralie Champagne Dr. Roxana Hires, NP Lyda Jester, [place] [facility] [place], [place] Forestine Na, NP Audry Riles, PharmD   Please be sure to bring in all your medications bottles to every appointment.   If you have any questions or concerns before your next appointment please send [place] a message through [place] or call our office at [phone].    TO LEAVE A MESSAGE FOR THE NURSE SELECT OPTION 2, PLEASE LEAVE A MESSAGE INCLUDING: YOUR NAME DATE OF BIRTH CALL BACK NUMBER REASON FOR CALL**this is important as we prioritize the call backs  YOU WILL RECEIVE A CALL BACK THE SAME DAY AS LONG AS YOU CALL BEFORE 4:00 PM

## 2022-05-02 ENCOUNTER — Emergency Department (HOSPITAL_COMMUNITY)
Admission: EM | Admit: 2022-05-02 | Discharge: 2022-05-02 | Disposition: A | Discharge status: Home / Self Care | Payer: BC Managed Care – PPO | Attending: Emergency Medicine | Admitting: Emergency Medicine

## 2022-05-02 ENCOUNTER — Emergency Department (HOSPITAL_COMMUNITY): Payer: BC Managed Care – PPO

## 2022-05-02 DIAGNOSIS — Y92410 Unspecified street and highway as the place of occurrence of the external cause: Secondary | ICD-10-CM | POA: Insufficient documentation

## 2022-05-02 DIAGNOSIS — M545 Low back pain, unspecified: Secondary | ICD-10-CM | POA: Diagnosis not present

## 2022-05-02 DIAGNOSIS — Z041 Encounter for examination and observation following transport accident: Secondary | ICD-10-CM | POA: Diagnosis not present

## 2022-05-02 MED ORDER — METHOCARBAMOL 500 MG PO TABS: 500.0000 mg | ORAL_TABLET | Freq: Two times a day (BID) | ORAL | 0 refills | Status: AC

## 2022-05-02 MED ORDER — NAPROXEN 375 MG PO TABS: 375.0000 mg | ORAL_TABLET | Freq: Two times a day (BID) | ORAL | 0 refills | Status: AC

## 2022-05-02 NOTE — Discharge Instructions (Addendum)
The x-ray of your back did not show any acute findings.  You were provided with a prescription for anti-inflammatories, please take 1 tablet with food for the next 7 days.  You were also given a prescription for a muscle relaxer, this can be sedating, please take 1 tablet twice a day as needed.  Please be aware this medication can make you drowsy, do not drink alcohol or drive while taking this medication.  You may apply ice or heat to the area.
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
## 2022-06-04 NOTE — Addendum Note (Signed)
Encounter addended by: Micki Riley, RN on: 06/04/2022 1:41 PM  Actions taken: Imaging Exam ended

## 2022-06-12 DIAGNOSIS — I10 Essential (primary) hypertension: Secondary | ICD-10-CM | POA: Diagnosis not present

## 2022-06-12 DIAGNOSIS — S39012D Strain of muscle, fascia and tendon of lower back, subsequent encounter: Secondary | ICD-10-CM | POA: Diagnosis not present

## 2022-06-12 DIAGNOSIS — I5020 Unspecified systolic (congestive) heart failure: Secondary | ICD-10-CM | POA: Diagnosis not present

## 2022-07-07 ENCOUNTER — Other Ambulatory Visit (HOSPITAL_COMMUNITY): Payer: Self-pay | Admitting: Family Medicine

## 2022-07-18 ENCOUNTER — Other Ambulatory Visit (HOSPITAL_COMMUNITY): Payer: Self-pay

## 2022-07-18 MED ORDER — IVABRADINE HCL 5 MG PO TABS: 5.0000 mg | ORAL_TABLET | Freq: Two times a day (BID) | ORAL | 6 refills | Status: DC

## 2022-08-05 ENCOUNTER — Other Ambulatory Visit (HOSPITAL_COMMUNITY): Payer: Self-pay

## 2022-08-09 ENCOUNTER — Ambulatory Visit (HOSPITAL_COMMUNITY)
Admission: RE | Admit: 2022-08-09 | Discharge: 2022-08-09 | Disposition: A | Discharge status: Home / Self Care | Payer: BC Managed Care – PPO | Source: Ambulatory Visit | Attending: Internal Medicine | Admitting: Internal Medicine

## 2022-08-09 ENCOUNTER — Encounter (HOSPITAL_COMMUNITY): Payer: Self-pay | Admitting: Internal Medicine

## 2022-08-09 VITALS — BP 140/80 | HR 71 | Wt 182.2 lb

## 2022-08-09 DIAGNOSIS — Z7901 Long term (current) use of anticoagulants: Secondary | ICD-10-CM | POA: Insufficient documentation

## 2022-08-09 DIAGNOSIS — I5022 Chronic systolic (congestive) heart failure: Secondary | ICD-10-CM

## 2022-08-09 DIAGNOSIS — F17210 Nicotine dependence, cigarettes, uncomplicated: Secondary | ICD-10-CM | POA: Diagnosis not present

## 2022-08-09 DIAGNOSIS — Z79899 Other long term (current) drug therapy: Secondary | ICD-10-CM | POA: Diagnosis not present

## 2022-08-09 DIAGNOSIS — I472 Ventricular tachycardia, unspecified: Secondary | ICD-10-CM | POA: Insufficient documentation

## 2022-08-09 DIAGNOSIS — I5082 Biventricular heart failure: Secondary | ICD-10-CM | POA: Insufficient documentation

## 2022-08-09 DIAGNOSIS — F1411 Cocaine abuse, in remission: Secondary | ICD-10-CM | POA: Diagnosis not present

## 2022-08-09 DIAGNOSIS — F10129 Alcohol abuse with intoxication, unspecified: Secondary | ICD-10-CM | POA: Diagnosis not present

## 2022-08-09 DIAGNOSIS — K219 Gastro-esophageal reflux disease without esophagitis: Secondary | ICD-10-CM | POA: Insufficient documentation

## 2022-08-09 DIAGNOSIS — Z7984 Long term (current) use of oral hypoglycemic drugs: Secondary | ICD-10-CM | POA: Insufficient documentation

## 2022-08-09 DIAGNOSIS — Z86711 Personal history of pulmonary embolism: Secondary | ICD-10-CM | POA: Insufficient documentation

## 2022-08-09 DIAGNOSIS — J45909 Unspecified asthma, uncomplicated: Secondary | ICD-10-CM | POA: Insufficient documentation

## 2022-08-09 DIAGNOSIS — R002 Palpitations: Secondary | ICD-10-CM | POA: Diagnosis not present

## 2022-08-09 DIAGNOSIS — R55 Syncope and collapse: Secondary | ICD-10-CM | POA: Diagnosis not present

## 2022-08-09 DIAGNOSIS — I428 Other cardiomyopathies: Secondary | ICD-10-CM | POA: Insufficient documentation

## 2022-08-09 DIAGNOSIS — Z720 Tobacco use: Secondary | ICD-10-CM

## 2022-08-09 LAB — BASIC METABOLIC PANEL
Anion gap: 8 (ref 5–15)
BUN: 13 mg/dL (ref 6–20)
CO2: 26 mmol/L (ref 22–32)
Calcium: 9.6 mg/dL (ref 8.9–10.3)
Chloride: 102 mmol/L (ref 98–111)
Creatinine, Ser: 0.9 mg/dL (ref 0.61–1.24)
GFR, Estimated: >60 mL/min (ref >60)
Glucose, Bld: 88 mg/dL (ref 70–99)
Potassium: 4.2 mmol/L (ref 3.5–5.1)
Sodium: 136 mmol/L (ref 135–145)

## 2022-08-09 LAB — BRAIN NATRIURETIC PEPTIDE: B Natriuretic Peptide: 14.3 pg/mL (ref 0.0–100.0)

## 2022-08-09 MED ORDER — DAPAGLIFLOZIN PROPANEDIOL 10 MG PO TABS: 10.0000 mg | ORAL_TABLET | Freq: Every day | ORAL | 3 refills | Status: DC

## 2022-08-09 MED ORDER — SPIRONOLACTONE 25 MG PO TABS
12.5000 mg | ORAL_TABLET | Freq: Every day | ORAL | 3 refills | Status: DC
Start: 2022-08-09 — End: 2023-01-24

## 2022-08-09 MED ORDER — IVABRADINE HCL 5 MG PO TABS: 5.0000 mg | ORAL_TABLET | Freq: Two times a day (BID) | ORAL | 3 refills | Status: DC

## 2022-08-09 MED ORDER — CARVEDILOL 6.25 MG PO TABS: 6.2500 mg | ORAL_TABLET | Freq: Two times a day (BID) | ORAL | 3 refills | Status: DC

## 2022-08-09 NOTE — Addendum Note (Signed)
Encounter addended by: Jerl Mina, RN on: 08/09/2022 11:24 AM  Actions taken: Diagnosis association updated, Clinical Note Signed, Order list changed, Charge Capture section accepted

## 2022-08-09 NOTE — Patient Instructions (Signed)
INCREASE Carvedilol to 6.25 mg Twice daily  Labs done today, your results will be available in MyChart, we will contact you for abnormal readings.  Your physician has requested that you have an echocardiogram. Echocardiography is a painless test that uses sound waves to create images of your heart. It provides your doctor with information about the size and shape of your heart and how well your heart's chambers and valves are working. This procedure takes approximately one hour. There are no restrictions for this procedure. Please do NOT wear cologne, perfume, aftershave, or lotions (deodorant is allowed). Please arrive 15 minutes prior to your appointment time.  Your physician recommends that you schedule a follow-up appointment in: 4 months ( July) ** please call the office in May to arrange your follow up appointment. **  If you have any questions or concerns before your next appointment please send [place] a message through [place] or call our office at [phone].    TO LEAVE A MESSAGE FOR THE NURSE SELECT OPTION 2, PLEASE LEAVE A MESSAGE INCLUDING: YOUR NAME DATE OF BIRTH CALL BACK NUMBER REASON FOR CALL**this is important as we prioritize the call backs  YOU WILL RECEIVE A CALL BACK THE SAME DAY AS LONG AS YOU CALL BEFORE 4:00 PM  At the [facility], you and your health needs are our priority. As part of our continuing mission to provide you with exceptional heart care, we have created designated Provider Care Teams. These Care Teams include your primary Cardiologist (physician) and Advanced Practice Providers (APPs- Physician Assistants and Nurse Practitioners) who all work together to provide you with the care you need, when you need it.   You may see any of the following providers on your designated Care Team at your next follow up: Dr Glori Bickers Dr Loralie Champagne Dr. Roxana Hires, NP Lyda Jester, [place] [facility] [place],  [place] Forestine Na, NP Audry Riles, PharmD   Please be sure to bring in all your medications bottles to every appointment.    Thank you for choosing [facility]

## 2022-08-09 NOTE — Progress Notes (Signed)
[facility]                 PCP: [name] NP HF Cardiologist: Dr. Haroldine Laws    Reason for Visit: Follow up for Chronic Systolic Heart Failure  HPI: RobertRobert [name] is a 42 y.o. male with h/o heavy ETOH and cocaine use, PE,  biventricular systolic heart failure and tobacco abuse.   Admitted with acute onset biventricular HF in 5/21. EF 15%. He was drinking at least 2 beers every night and about 12-24 over the weekend. Also drinking 1/5 of liqour every other day. Cath showed normal coronaries. Elevated filling pressures with normal output. Required milrinone. 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 CT showed small LLL PE. Placed on xarelto. Started on HF meds.  Discharge weight 178 pounds.   Readmitted 6/21 for mild acute blood loss related anemia. EGD showed no active bleeding but evidence of some gastritis. PPI increased. Repeat EGD/colonoscopy 1/23 had 1 small polyp removed, normal esophagus.  Has had numerous ED visits and admits for ETOH withdrawal and AKI. Reports stopping ETOH in 7/22  Echo 10/21 EF 30-35% mild RV HK.  Echo 3/23 EF 40-45%   Follow up 5/23, lasix switched form daily to PRN.  Echo 9/23 EF 40-45%, RV normal.  Follow up 11/23, Zio placed for palpitations. 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   Today he returns for HF follow up. Works Medical laboratory scientific officer at [employer]. Exercising with weights and walking. Clean/sober since 7/22. Denies CP or SOB. Out of Corlanor for 1 month. Having palpitations for 55mins at night 2-3x/week. Occasionally dizzy. Drinking 3-4 cups of coffee per day. Smoking 1-2 cigs/day.  Zio 1/24 1 brief run NSVT.   Cardiac studies - Echo (5/21): EF <20%  Prominent trabeculation. Grade III DD  - R/LHC (5/21): showed normal coronaries, elevated filling pressures with normal output.  - cMRI (5/21): showed Noncompaction- LVEF 14% RVEF 16%  Severely dialted R/L [facility] - Echo  (6/21): Pericardial effusion remains moderate in size. No tamponade physiology.  - Echo (10/21): EF 30-35% RV normal. No effusion.  - Echo (2/22): EF 30% - Echo (9/23): EF 40-45%, RV normal   ROS: All systems negative except as listed in HPI, PMH and Problem List.  SH:  Social History   Socioeconomic History   Marital status: Single    Spouse name: Not on file   Number of children: Not on file   Years of education: Not on file   Highest education level: Not on file  Occupational History   Occupation: [employer]  Tobacco Use   Smoking status: Every Day    Packs/day: 0.25    Years: 24.00    Additional pack years: 0.00    Total pack years: 6.00    Types: Cigarettes   Smokeless tobacco: Never  Vaping Use   Vaping Use: Former  Substance and Sexual Activity   Alcohol use: Not Currently    Alcohol/week: 8.0 standard drinks of alcohol    Types: 8 Shots of liquor per week   Drug use: Not Currently   Sexual activity: Yes  Other Topics Concern   Not on file  Social History Narrative   Not on file   Social Determinants of Health   Financial Resource Strain: High Risk (11/03/2019)   Overall Financial Resource Strain (CARDIA)    Difficulty of Paying Living Expenses: Very hard  Food Insecurity: Food Insecurity Present (11/03/2019)   Hunger  Vital Sign    Worried About Charity fundraiser in the Last Year: Sometimes true    Ran Out of Food in the Last Year: Sometimes true  Transportation Needs: Not on file  Physical Activity: Not on file  Stress: Not on file  Social Connections: Not on file  Intimate Partner Violence: Not on file   FH:  Family History  Problem Relation Age of Onset   Heart failure Father    Diabetes Father    Kidney disease Father    Heart disease Father 17   Heart disease Maternal Grandfather 90   Heart attack Maternal Grandfather    Heart disease Paternal Grandfather 30   Lung cancer Maternal Aunt    Past Medical History:  Diagnosis Date    AKI (acute kidney injury) ([place]) 10/30/2019   Alcohol use    Alcohol withdrawal ([place]) 03/06/2020   Anxiety    Asthma    CHF (congestive heart failure) (HCC)    Depression    GERD (gastroesophageal reflux disease)    Pulmonary embolism (HCC)    Seizure (HCC)    Current Outpatient Medications  Medication Sig Dispense Refill   carvedilol (COREG) 3.125 MG tablet Take 1 tablet by mouth twice daily 180 tablet 3   ENTRESTO 97-103 MG Take 1 tablet by mouth twice daily 60 tablet 0   FARXIGA 10 MG TABS tablet TAKE 1 TABLET BY MOUTH ONCE DAILY BEFORE BREAKFAST 90 tablet 3   furosemide (LASIX) 20 MG tablet Take 1 tablet (20 mg total) by mouth daily as needed. 30 tablet 3   Menthol, Topical Analgesic, (BIOFREEZE EX) Apply 1 application topically daily as needed (pain).     Naphazoline HCl (CLEAR EYES OP) Place 1 drop into both eyes daily as needed (redness/ irritation).     nicotine (NICODERM CQ - DOSED IN MG/24 HOURS) 21 mg/24hr patch Place 21 mg onto the skin daily as needed (nicotine dependence).     pantoprazole (PROTONIX) 40 MG tablet Take 1 tablet (40 mg total) by mouth 2 (two) times daily. 180 tablet 3   spironolactone (ALDACTONE) 25 MG tablet Take 0.5 tablets (12.5 mg total) by mouth daily. 90 tablet 0   folic acid (FOLVITE) 1 MG tablet Take 1 tablet (1 mg total) by mouth daily. (Patient not taking: Reported on 08/09/2022) 30 tablet 3   ivabradine (CORLANOR) 5 MG TABS tablet Take 1 tablet (5 mg total) by mouth 2 (two) times daily with a meal. (Patient not taking: Reported on 08/09/2022) 60 tablet 6   promethazine-dextromethorphan (PROMETHAZINE-DM) 6.25-15 MG/5ML syrup Take 2.5 mLs by mouth 3 (three) times daily as needed for cough. (Patient not taking: Reported on 08/09/2022) 100 mL 0   No current facility-administered medications for this encounter.   Wt Readings from Last 3 Encounters:  08/09/22 82.6 kg (182 lb 3.2 oz)  05/01/22 83.4 kg (183 lb 12.8 oz)  04/03/22 85 kg (187 lb 6.4 oz)   BP  (!) 140/80   Pulse 71   Wt 82.6 kg (182 lb 3.2 oz)   SpO2 98%   BMI 26.14 kg/m   PHYSICAL EXAM: General:  Well appearing. No resp difficulty HEENT: normal Neck: supple. no JVD. Carotids 2+ bilat; no bruits. No lymphadenopathy or thryomegaly appreciated. Cor: PMI nondisplaced. Regular rate & rhythm. No rubs, gallops or murmurs. Lungs: clear Abdomen: soft, nontender, nondistended. No hepatosplenomegaly. No bruits or masses. Good bowel sounds. Extremities: no cyanosis, clubbing, rash, edema Neuro: alert & orientedx3, cranial nerves grossly intact. moves  all 4 extremities w/o difficulty. Affect pleasant  ECG NSR 73 diffuse ST-T abnormalities Personally reviewed   ASSESSMENT & PLAN: 1. Chronic Systolic Heart Failure, NICM  - Echo (5/21): EF 15% RV moderately reduced. Suspect ETOH and cocaine playing a role.  - LHC (5/21): normal cors. 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  - Echo (10/21): EF 30-35% RV normal. No effusion.  - Echo (2/22): EF 30%  - Echo 3/23 EF 40-45%  - Echo 9/23 EF 40-45%, RV normal - Doing great. NYHA IVolume ok  - Continue Lasix 20 mg PRN  - Continue spiro 12.5 mg daily. - Continue Entresto 97-103 mg bid.  - Increase Coreg to 6.25 mg bid.  - Continue Farxiga 10 mg daily. - Continue Corlanor 5 mg bid. HR 71 today. - Referred for genetic counseling with Dr. Broadus John. 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 - Labs today. Due for repeat echo   2. H/o Syncope - 07/10/20. - No recurrence   3. H/o Pulmonary Emboli - CTA w/ small LLL PE 5/21. - LE Venous dopplers negative for DVT.  - Given small PE. Risk of ongoing AC likely outweighs benefit. - Off Xarelto  4. ETOH Abuse - Heavy drinker for many years since the age of 37.  - 01/2020 had 28 day detox [facility] but started drinking again.  - Has remained sober since 11/2020. No  change.   5. H/o Cocaine Abuse - Denies recent use.  - UDS 10/08/20 negative.  6. Tobacco Abuse - Down to 2-5 cig/daily. - Working on quitting.  7. Palpitations  - Zio 1/24 ok   Glori Bickers, MD 08/09/22 11:08 AM

## 2022-08-12 ENCOUNTER — Other Ambulatory Visit (HOSPITAL_COMMUNITY): Payer: Self-pay

## 2022-08-29 ENCOUNTER — Ambulatory Visit (HOSPITAL_COMMUNITY): Admission: RE | Admit: 2022-08-29 | Payer: BC Managed Care – PPO | Source: Ambulatory Visit

## 2022-08-29 ENCOUNTER — Other Ambulatory Visit (HOSPITAL_COMMUNITY): Payer: Self-pay | Admitting: Family Medicine

## 2022-09-06 ENCOUNTER — Ambulatory Visit (INDEPENDENT_AMBULATORY_CARE_PROVIDER_SITE_OTHER): Payer: BC Managed Care – PPO

## 2022-09-06 ENCOUNTER — Ambulatory Visit
Admission: EM | Admit: 2022-09-06 | Discharge: 2022-09-06 | Disposition: A | Discharge status: Home / Self Care | Payer: BC Managed Care – PPO | Attending: Urgent Care | Admitting: Urgent Care

## 2022-09-06 DIAGNOSIS — J45909 Unspecified asthma, uncomplicated: Secondary | ICD-10-CM | POA: Insufficient documentation

## 2022-09-06 DIAGNOSIS — Z79899 Other long term (current) drug therapy: Secondary | ICD-10-CM | POA: Diagnosis not present

## 2022-09-06 DIAGNOSIS — R059 Cough, unspecified: Secondary | ICD-10-CM | POA: Diagnosis not present

## 2022-09-06 DIAGNOSIS — I5022 Chronic systolic (congestive) heart failure: Secondary | ICD-10-CM | POA: Diagnosis not present

## 2022-09-06 DIAGNOSIS — F17210 Nicotine dependence, cigarettes, uncomplicated: Secondary | ICD-10-CM | POA: Diagnosis not present

## 2022-09-06 DIAGNOSIS — J209 Acute bronchitis, unspecified: Secondary | ICD-10-CM | POA: Insufficient documentation

## 2022-09-06 DIAGNOSIS — R0602 Shortness of breath: Secondary | ICD-10-CM | POA: Diagnosis not present

## 2022-09-06 DIAGNOSIS — R062 Wheezing: Secondary | ICD-10-CM

## 2022-09-06 DIAGNOSIS — Z1152 Encounter for screening for COVID-19: Secondary | ICD-10-CM | POA: Insufficient documentation

## 2022-09-06 MED ORDER — IPRATROPIUM-ALBUTEROL 0.5-2.5 (3) MG/3ML IN SOLN
3.0000 mL | Freq: Once | RESPIRATORY_TRACT | Status: AC
  Administered 2022-09-06: 3 mL via RESPIRATORY_TRACT

## 2022-09-06 MED ORDER — PREDNISONE 10 MG PO TABS: 30.0000 mg | ORAL_TABLET | Freq: Every day | ORAL | 0 refills | Status: DC

## 2022-09-06 MED ORDER — PROMETHAZINE-DM 6.25-15 MG/5ML PO SYRP: 2.5000 mL | ORAL_SOLUTION | Freq: Three times a day (TID) | ORAL | 0 refills | Status: DC | PRN

## 2022-09-06 MED ORDER — ALBUTEROL SULFATE HFA 108 (90 BASE) MCG/ACT IN AERS: 1.0000 | INHALATION_SPRAY | Freq: Four times a day (QID) | RESPIRATORY_TRACT | 0 refills | Status: AC | PRN

## 2022-09-06 NOTE — ED Triage Notes (Signed)
Pt reports shortness of breath and chest congestion x 2 days. Mucinex gives some relief.

## 2022-09-06 NOTE — Discharge Instructions (Addendum)
Please make sure you follow-up with your cardiologist.  For now with the bronchitis you have used the prednisone course, albuterol inhaler.  Can also use cough syrup as needed.

## 2022-09-06 NOTE — ED Provider Notes (Signed)
[name]  Note:  This document was prepared using Conservation officer, historic buildings and may include unintentional dictation errors.  MRN: [id number] DOB: 12-04-1980  Subjective:   Robert [name] is a 42 y.o. male presenting for 2 day history of coughing, chest congestion, shortness of breath. Vapes daily, has 3-4 cigarettes daily as well. Takes Entresto, carvedilol, spironolactone. Last echocardiogram was 01/30/2022, EF was 40%-45%.  No history of asthma.  Has had bronchitis in the past.  Did a COVID test at home and was negative.  No current facility-administered medications for this encounter.  Current Outpatient Medications:    carvedilol (COREG) 6.25 MG tablet, Take 1 tablet (6.25 mg total) by mouth 2 (two) times daily., Disp: 180 tablet, Rfl: 3   dapagliflozin propanediol (FARXIGA) 10 MG TABS tablet, Take 1 tablet (10 mg total) by mouth daily before breakfast., Disp: 90 tablet, Rfl: 3   folic acid (FOLVITE) 1 MG tablet, Take 1 tablet (1 mg total) by mouth daily. (Patient not taking: Reported on 08/09/2022), Disp: 30 tablet, Rfl: 3   ivabradine (CORLANOR) 5 MG TABS tablet, Take 1 tablet (5 mg total) by mouth 2 (two) times daily with a meal., Disp: 180 tablet, Rfl: 3   Menthol, Topical Analgesic, (BIOFREEZE EX), Apply 1 application topically daily as needed (pain)., Disp: , Rfl:    Naphazoline HCl (CLEAR EYES OP), Place 1 drop into both eyes daily as needed (redness/ irritation)., Disp: , Rfl:    nicotine (NICODERM CQ - DOSED IN MG/24 HOURS) 21 mg/24hr patch, Place 21 mg onto the skin daily as needed (nicotine dependence)., Disp: , Rfl:    pantoprazole (PROTONIX) 40 MG tablet, Take 1 tablet (40 mg total) by mouth 2 (two) times daily., Disp: 180 tablet, Rfl: 3   promethazine-dextromethorphan (PROMETHAZINE-DM) 6.25-15 MG/5ML syrup, Take 2.5 mLs by mouth 3 (three) times daily as needed for cough. (Patient not taking: Reported on 08/09/2022), Disp: 100 mL, Rfl: 0    sacubitril-valsartan (ENTRESTO) 97-103 MG, Take 1 tablet by mouth 2 (two) times daily., Disp: 60 tablet, Rfl: 3   spironolactone (ALDACTONE) 25 MG tablet, Take 0.5 tablets (12.5 mg total) by mouth daily., Disp: 45 tablet, Rfl: 3   Allergies  Allergen Reactions   Bactrim [Sulfamethoxazole-Trimethoprim] Rash    Past Medical History:  Diagnosis Date   AKI (acute kidney injury) (HCC) 10/30/2019   Alcohol use    Alcohol withdrawal (HCC) 03/06/2020   Anxiety    Asthma    CHF (congestive heart failure) (HCC)    Depression    GERD (gastroesophageal reflux disease)    Pulmonary embolism (HCC)    Seizure (HCC)      Past Surgical History:  Procedure Laterality Date   BIOPSY  05/31/2021   Procedure: BIOPSY;  Surgeon: Tressia Danas, MD;  Location: [facility] ENDOSCOPY;  Service: Gastroenterology;;   COLONOSCOPY WITH PROPOFOL N/A 05/31/2021   Procedure: COLONOSCOPY WITH PROPOFOL;  Surgeon: Tressia Danas, MD;  Location: [facility] ENDOSCOPY;  Service: Gastroenterology;  Laterality: N/A;   ESOPHAGOGASTRODUODENOSCOPY (EGD) WITH PROPOFOL N/A 11/01/2019   Procedure: ESOPHAGOGASTRODUODENOSCOPY (EGD) WITH PROPOFOL;  Surgeon: Graylin Shiver, MD;  Location: [facility] ENDOSCOPY;  Service: Endoscopy;  Laterality: N/A;   ESOPHAGOGASTRODUODENOSCOPY (EGD) WITH PROPOFOL N/A 05/31/2021   Procedure: ESOPHAGOGASTRODUODENOSCOPY (EGD) WITH PROPOFOL;  Surgeon: Tressia Danas, MD;  Location: [facility] ENDOSCOPY;  Service: Gastroenterology;  Laterality: N/A;   POLYPECTOMY  05/31/2021   Procedure: POLYPECTOMY;  Surgeon: Tressia Danas, MD;  Location: [facility] ENDOSCOPY;  Service: Gastroenterology;;   RIGHT/LEFT  HEART CATH AND CORONARY ANGIOGRAPHY N/A 10/06/2019   Procedure: RIGHT/LEFT HEART CATH AND CORONARY ANGIOGRAPHY;  Surgeon: Lennette Bihari, MD;  Location: [facility] INVASIVE CV LAB;  Service: Cardiovascular;  Laterality: N/A;   WRIST SURGERY Right    age17    Family History  Problem Relation Age of Onset   Heart failure Father    Diabetes  Father    Kidney disease Father    Heart disease Father 79   Heart disease Maternal Grandfather 1   Heart attack Maternal Grandfather    Heart disease Paternal Grandfather 27   Lung cancer Maternal Aunt     Social History   Tobacco Use   Smoking status: Every Day    Packs/day: 0.25    Years: 24.00    Additional pack years: 0.00    Total pack years: 6.00    Types: Cigarettes   Smokeless tobacco: Never  Vaping Use   Vaping Use: Former  Substance Use Topics   Alcohol use: Not Currently    Alcohol/week: 8.0 standard drinks of alcohol    Types: 8 Shots of liquor per week   Drug use: Not Currently    ROS   Objective:   Vitals: BP 130/87 (BP Location: Right Arm)   Pulse 80   Temp 99 F (37.2 C) (Oral)   Resp 18   SpO2 95%   Physical Exam Constitutional:      General: He is not in acute distress.    Appearance: Normal appearance. He is well-developed and normal weight. He is not ill-appearing, toxic-appearing or diaphoretic.  HENT:     Head: Normocephalic and atraumatic.     Right Ear: Tympanic membrane, ear canal and external ear normal. No drainage, swelling or tenderness. No middle ear effusion. There is no impacted cerumen. Tympanic membrane is not erythematous or bulging.     Left Ear: Tympanic membrane, ear canal and external ear normal. No drainage, swelling or tenderness.  No middle ear effusion. There is no impacted cerumen. Tympanic membrane is not erythematous or bulging.     Nose: Nose normal. No congestion or rhinorrhea.     Mouth/Throat:     Mouth: Mucous membranes are moist.     Pharynx: No oropharyngeal exudate or posterior oropharyngeal erythema.  Eyes:     General: No scleral icterus.       Right eye: No discharge.        Left eye: No discharge.     Extraocular Movements: Extraocular movements intact.     Conjunctiva/sclera: Conjunctivae normal.  Cardiovascular:     Rate and Rhythm: Normal rate and regular rhythm.     Heart sounds: Normal heart  sounds. No murmur heard.    No friction rub. No gallop.  Pulmonary:     Effort: Pulmonary effort is normal. No respiratory distress.     Breath sounds: No stridor. Examination of the right-upper field reveals wheezing. Examination of the left-upper field reveals wheezing. Examination of the right-middle field reveals wheezing. Examination of the left-middle field reveals wheezing. Examination of the right-lower field reveals wheezing. Examination of the left-lower field reveals wheezing. Wheezing and rhonchi present. No decreased breath sounds or rales.  Musculoskeletal:     Cervical back: Normal range of motion and neck supple. No rigidity. No muscular tenderness.  Neurological:     General: No focal deficit present.     Mental Status: He is alert and oriented to person, place, and time.  Psychiatric:  Mood and Affect: Mood normal.        Behavior: Behavior normal.        Thought Content: Thought content normal.     DG Chest 2 View  Result Date: 09/06/2022 CLINICAL DATA:  Cough, shortness of breath EXAM: CHEST - 2 VIEW COMPARISON:  04/25/2022 FINDINGS: Cardiac size is within normal limits. Peribronchial thickening is seen. There is no focal pulmonary consolidation. There is no pleural effusion or pneumothorax. IMPRESSION: Peribronchial thickening suggests bronchitis. There is no focal pulmonary consolidation. There is no pleural effusion or pneumothorax. Electronically Signed   By: Ernie Avena M.D.   On: 09/06/2022 17:19    A 0.5 mg - 2.5 mg Atrovent albuterol nebulized treatment was administered in clinic.  Assessment and Plan :   PDMP not reviewed this encounter.  1. Acute bronchitis, unspecified organism   2. Chronic systolic congestive heart failure (HCC)   3. Wheezing    Recommended an oral prednisone course, albuterol and general supportive care for management of his acute bronchitis.  Ejection fraction is reasonable to use this and he did well the last time he had  this treatment.  Will use a lower dose of 30 mg.  No vascular congestion seen on the chest x-ray.  Maintain close follow-up with his cardiologist.  Counseled patient on potential for adverse effects with medications prescribed/recommended today, ER and return-to-clinic precautions discussed, patient verbalized understanding.    Wallis Bamberg, [place] 09/06/22 [phone]

## 2022-09-07 LAB — SARS CORONAVIRUS 2 (TAT 6-24 HRS): SARS Coronavirus 2: NEGATIVE

## 2022-09-23 ENCOUNTER — Ambulatory Visit (HOSPITAL_COMMUNITY)
Admission: RE | Admit: 2022-09-23 | Discharge: 2022-09-23 | Disposition: A | Discharge status: Home / Self Care | Payer: BC Managed Care – PPO | Source: Ambulatory Visit | Attending: Internal Medicine | Admitting: Internal Medicine

## 2022-09-23 DIAGNOSIS — I5022 Chronic systolic (congestive) heart failure: Secondary | ICD-10-CM | POA: Diagnosis not present

## 2022-09-23 LAB — ECHOCARDIOGRAM COMPLETE
Area-P 1/2: 2.95 cm2
Calc EF: 56.4 %
S' Lateral: 3.9 cm
Single Plane A2C EF: 57 %
Single Plane A4C EF: 55.2 %

## 2022-09-23 NOTE — Progress Notes (Signed)
Echocardiogram 2D Echocardiogram has been performed.  Robert [name] 09/23/2022, 9:46 AM

## 2022-10-22 DIAGNOSIS — Z0000 Encounter for general adult medical examination without abnormal findings: Secondary | ICD-10-CM | POA: Diagnosis not present

## 2022-10-22 DIAGNOSIS — I5020 Unspecified systolic (congestive) heart failure: Secondary | ICD-10-CM | POA: Diagnosis not present

## 2022-10-22 DIAGNOSIS — Z131 Encounter for screening for diabetes mellitus: Secondary | ICD-10-CM | POA: Diagnosis not present

## 2022-10-22 DIAGNOSIS — Z23 Encounter for immunization: Secondary | ICD-10-CM | POA: Diagnosis not present

## 2022-10-22 DIAGNOSIS — Z13220 Encounter for screening for lipoid disorders: Secondary | ICD-10-CM | POA: Diagnosis not present

## 2022-11-06 ENCOUNTER — Ambulatory Visit
Admission: EM | Admit: 2022-11-06 | Discharge: 2022-11-06 | Disposition: A | Discharge status: Home / Self Care | Payer: BC Managed Care – PPO | Attending: Urgent Care | Admitting: Urgent Care

## 2022-11-06 ENCOUNTER — Ambulatory Visit (INDEPENDENT_AMBULATORY_CARE_PROVIDER_SITE_OTHER): Payer: BC Managed Care – PPO

## 2022-11-06 DIAGNOSIS — Z760 Encounter for issue of repeat prescription: Secondary | ICD-10-CM

## 2022-11-06 DIAGNOSIS — S60455A Superficial foreign body of left ring finger, initial encounter: Secondary | ICD-10-CM | POA: Diagnosis not present

## 2022-11-06 DIAGNOSIS — Z043 Encounter for examination and observation following other accident: Secondary | ICD-10-CM | POA: Diagnosis not present

## 2022-11-06 MED ORDER — ENTRESTO 97-103 MG PO TABS: 1.0000 | ORAL_TABLET | Freq: Two times a day (BID) | ORAL | 0 refills | Status: DC

## 2022-11-06 NOTE — Discharge Instructions (Signed)
Change your dressing tomorrow. You can use warm soapy water to remove the dressing. Keep the wound clean and covered tomorrow as well. If you develop fever, drainage, redness and swelling, then you will need to come back as that is a sign of infection.

## 2022-11-06 NOTE — ED Provider Notes (Signed)
[name] - URGENT [facility]ARE C[facility]  Note:  This document was prepared using Conservation officer, historic buildings and may include unintentional dictation errors.  MRN: [id number] DOB: 1981/03/15  Subjective:   Robert [name] is a 42 y.o. male presenting for 1.5 week history of persistent foreign body sensation of the left 4th finger pad. Believes it is a piece of metal from changing a wheel at work. Has tried to pick at the area himself using a safety pin needle. He is also requesting a refill of his Entresto. Has follow up set up with his cardiologist.   No current facility-administered medications for this encounter.  Current Outpatient Medications:    albuterol (VENTOLIN HFA) 108 (90 Base) MCG/ACT inhaler, Inhale 1-2 puffs into the lungs every 6 (six) hours as needed for wheezing or shortness of breath., Disp: 18 g, Rfl: 0   carvedilol (COREG) 6.25 MG tablet, Take 1 tablet (6.25 mg total) by mouth 2 (two) times daily., Disp: 180 tablet, Rfl: 3   dapagliflozin propanediol (FARXIGA) 10 MG TABS tablet, Take 1 tablet (10 mg total) by mouth daily before breakfast., Disp: 90 tablet, Rfl: 3   folic acid (FOLVITE) 1 MG tablet, Take 1 tablet (1 mg total) by mouth daily. (Patient not taking: Reported on 08/09/2022), Disp: 30 tablet, Rfl: 3   ivabradine (CORLANOR) 5 MG TABS tablet, Take 1 tablet (5 mg total) by mouth 2 (two) times daily with a meal., Disp: 180 tablet, Rfl: 3   Menthol, Topical Analgesic, (BIOFREEZE EX), Apply 1 application topically daily as needed (pain)., Disp: , Rfl:    Naphazoline HCl (CLEAR EYES OP), Place 1 drop into both eyes daily as needed (redness/ irritation)., Disp: , Rfl:    nicotine (NICODERM CQ - DOSED IN MG/24 HOURS) 21 mg/24hr patch, Place 21 mg onto the skin daily as needed (nicotine dependence)., Disp: , Rfl:    pantoprazole (PROTONIX) 40 MG tablet, Take 1 tablet (40 mg total) by mouth 2 (two) times daily., Disp: 180 tablet, Rfl: 3   predniSONE (DELTASONE) 10 MG  tablet, Take 3 tablets (30 mg total) by mouth daily with breakfast., Disp: 15 tablet, Rfl: 0   promethazine-dextromethorphan (PROMETHAZINE-DM) 6.25-15 MG/5ML syrup, Take 2.5 mLs by mouth 3 (three) times daily as needed for cough., Disp: 100 mL, Rfl: 0   sacubitril-valsartan (ENTRESTO) 97-103 MG, Take 1 tablet by mouth 2 (two) times daily., Disp: 60 tablet, Rfl: 3   spironolactone (ALDACTONE) 25 MG tablet, Take 0.5 tablets (12.5 mg total) by mouth daily., Disp: 45 tablet, Rfl: 3   Allergies  Allergen Reactions   Bactrim [Sulfamethoxazole-Trimethoprim] Rash    Past Medical History:  Diagnosis Date   AKI (acute kidney injury) (HCC) 10/30/2019   Alcohol use    Alcohol withdrawal (HCC) 03/06/2020   Anxiety    Asthma    CHF (congestive heart failure) (HCC)    Depression    GERD (gastroesophageal reflux disease)    Pulmonary embolism (HCC)    Seizure (HCC)      Past Surgical History:  Procedure Laterality Date   BIOPSY  05/31/2021   Procedure: BIOPSY;  Surgeon: Tressia Danas, MD;  Location: [facility];  Service: Gastroenterology;;   COLONOSCOPY WITH PROPOFOL N/A 05/31/2021   Procedure: COLONOSCOPY WITH PROPOFOL;  Surgeon: Tressia Danas, MD;  Location: [facility];  Service: Gastroenterology;  Laterality: N/A;   ESOPHAGOGASTRODUODENOSCOPY (EGD) WITH PROPOFOL N/A 11/01/2019   Procedure: ESOPHAGOGASTRODUODENOSCOPY (EGD) WITH PROPOFOL;  Surgeon: Graylin Shiver, MD;  Location: [facility] END[facility]COPY;  Service: Endoscopy;  Laterality: N/A;   ESOPHAGOGASTRODUODENOSCOPY (EGD) WITH PROPOFOL N/A 05/31/2021   Procedure: ESOPHAGOGASTRODUODENOSCOPY (EGD) WITH PROPOFOL;  Surgeon: Tressia Danas, MD;  Location: [facility];  Service: Gastroenterology;  Laterality: N/A;   POLYPECTOMY  05/31/2021   Procedure: POLYPECTOMY;  Surgeon: Tressia Danas, MD;  Location: [facility];  Service: Gastroenterology;;   RIGHT/LEFT HEART CATH AND CORONARY ANGIOGRAPHY N/A 10/06/2019   Procedure: RIGHT/LEFT HEART  CATH AND CORONARY ANGIOGRAPHY;  Surgeon: Lennette Bihari, MD;  Location: [facility];  Service: Cardiovascular;  Laterality: N/A;   WRIST SURGERY Right    age17    Family History  Problem Relation Age of Onset   Heart failure Father    Diabetes Father    Kidney disease Father    Heart disease Father 61   Heart disease Maternal Grandfather 63   Heart attack Maternal Grandfather    Heart disease Paternal Grandfather 71   Lung cancer Maternal Aunt     Social History   Tobacco Use   Smoking status: Every Day    Packs/day: 0.25    Years: 24.00    Additional pack years: 0.00    Total pack years: 6.00    Types: Cigarettes   Smokeless tobacco: Never  Vaping Use   Vaping Use: Former  Substance Use Topics   Alcohol use: Not Currently    Alcohol/week: 8.0 standard drinks of alcohol    Types: 8 Shots of liquor per week   Drug use: Not Currently    ROS   Objective:   Vitals: BP 123/77 (BP Location: Right Arm)   Pulse 84   Temp 98.3 F (36.8 C) (Oral)   Resp 18   SpO2 94%   Physical Exam Constitutional:      General: He is not in acute distress.    Appearance: Normal appearance. He is well-developed and normal weight. He is not ill-appearing, toxic-appearing or diaphoretic.  HENT:     Head: Normocephalic and atraumatic.     Right Ear: External ear normal.     Left Ear: External ear normal.     Nose: Nose normal.     Mouth/Throat:     Pharynx: Oropharynx is clear.  Eyes:     General: No scleral icterus.       Right eye: No discharge.        Left eye: No discharge.     Extraocular Movements: Extraocular movements intact.  Cardiovascular:     Rate and Rhythm: Normal rate.  Pulmonary:     Effort: Pulmonary effort is normal.  Musculoskeletal:     Cervical back: Normal range of motion.  Neurological:     Mental Status: He is alert and oriented to person, place, and time.  Psychiatric:        Mood and Affect: Mood normal.        Behavior: Behavior normal.         Thought Content: Thought content normal.        Judgment: Judgment normal.    DG Finger Ring Left  Result Date: 11/06/2022 CLINICAL DATA:  Left finger finger foreign body. Patient states he got a piece of metal in the left ring finger. EXAM: LEFT RING FINGER 2+V COMPARISON:  None Available. FINDINGS: There is no evidence of fracture or dislocation. There is no evidence of arthropathy or other focal bone abnormality. Soft tissues are unremarkable. No radiopaque foreign body identified. IMPRESSION: Negative. Electronically Signed   By: Larose Hires D.O.   On: 11/06/2022 17:19  I disagree with this over-read. There is a 1-59mm radiopaque sliver superficially over the mid finger pad of the 4th right finger seen best on a lateral view.   Foreign body removal: Verbal consent obtained.  Risks of infection and injury reviewed thoroughly with patient and he would like to proceed.  A digital block was performed using 1% lidocaine without epinephrine.  A cumulative amount of 5 cc was used.  Sterile prep and drape.  An 11 blade was used to make an incision at the mid finger pad of the left fourth finger.  Forceps were used to retrieve the foreign body which is imaged below in the center revealing ~95mm metallic sliver.  Wound was cleansed and dressed.    Follow up images no longer demonstrate the radiopaque foreign body.  Assessment and Plan :   PDMP not reviewed this encounter.  1. Foreign body in skin of left ring finger   2. Encounter for medication refill    Successful foreign body removal.  Discussed wound care.  Use Tylenol for pain relief.  Return to clinic precautions reviewed.  Refilled his Entresto, follow-up with his cardiologist.  Counseled patient on potential for adverse effects with medications prescribed/recommended today, ER and return-to-clinic precautions discussed, patient verbalized understanding.    Wallis Bamberg, [place] 11/06/22 1610

## 2022-11-06 NOTE — ED Triage Notes (Signed)
Pt reports he has a piece of metal in the left ring finger x 1 1/2 week when he was changing a wheel at work.   Pt needs Entresto refill.

## 2022-11-20 ENCOUNTER — Ambulatory Visit (HOSPITAL_COMMUNITY)
Admission: RE | Admit: 2022-11-20 | Discharge: 2022-11-20 | Disposition: A | Discharge status: Home / Self Care | Payer: BC Managed Care – PPO | Source: Ambulatory Visit | Attending: Internal Medicine | Admitting: Internal Medicine

## 2022-11-20 ENCOUNTER — Encounter (HOSPITAL_COMMUNITY): Payer: Self-pay | Admitting: Internal Medicine

## 2022-11-20 VITALS — BP 120/78 | HR 79 | Wt 193.8 lb

## 2022-11-20 DIAGNOSIS — Z86711 Personal history of pulmonary embolism: Secondary | ICD-10-CM | POA: Insufficient documentation

## 2022-11-20 DIAGNOSIS — F17210 Nicotine dependence, cigarettes, uncomplicated: Secondary | ICD-10-CM | POA: Diagnosis not present

## 2022-11-20 DIAGNOSIS — Z8249 Family history of ischemic heart disease and other diseases of the circulatory system: Secondary | ICD-10-CM | POA: Insufficient documentation

## 2022-11-20 DIAGNOSIS — Z720 Tobacco use: Secondary | ICD-10-CM | POA: Diagnosis not present

## 2022-11-20 DIAGNOSIS — Z5986 Financial insecurity: Secondary | ICD-10-CM | POA: Diagnosis not present

## 2022-11-20 DIAGNOSIS — Z79899 Other long term (current) drug therapy: Secondary | ICD-10-CM | POA: Insufficient documentation

## 2022-11-20 DIAGNOSIS — F1010 Alcohol abuse, uncomplicated: Secondary | ICD-10-CM | POA: Diagnosis not present

## 2022-11-20 DIAGNOSIS — I5022 Chronic systolic (congestive) heart failure: Secondary | ICD-10-CM | POA: Diagnosis not present

## 2022-11-20 DIAGNOSIS — Z7984 Long term (current) use of oral hypoglycemic drugs: Secondary | ICD-10-CM | POA: Insufficient documentation

## 2022-11-20 DIAGNOSIS — I428 Other cardiomyopathies: Secondary | ICD-10-CM | POA: Diagnosis not present

## 2022-11-20 LAB — BASIC METABOLIC PANEL
Anion gap: 6 (ref 5–15)
BUN: 16 mg/dL (ref 6–20)
CO2: 27 mmol/L (ref 22–32)
Calcium: 8.9 mg/dL (ref 8.9–10.3)
Chloride: 102 mmol/L (ref 98–111)
Creatinine, Ser: 0.96 mg/dL (ref 0.61–1.24)
GFR, Estimated: >60 mL/min (ref >60)
Glucose, Bld: 84 mg/dL (ref 70–99)
Potassium: 4.2 mmol/L (ref 3.5–5.1)
Sodium: 135 mmol/L (ref 135–145)

## 2022-11-20 LAB — CBC
HCT: 38.5 % — ABNORMAL LOW (ref 39.0–52.0)
Hemoglobin: 12.6 g/dL — ABNORMAL LOW (ref 13.0–17.0)
MCH: 28.9 pg (ref 26.0–34.0)
MCHC: 32.7 g/dL (ref 30.0–36.0)
MCV: 88.3 fL (ref 80.0–100.0)
Platelets: 237 10*3/uL (ref 150–400)
RBC: 4.36 MIL/uL (ref 4.22–5.81)
RDW: 12.9 % (ref 11.5–15.5)
WBC: 4.6 10*3/uL (ref 4.0–10.5)
nRBC: 0 % (ref 0.0–0.2)

## 2022-11-20 LAB — HEMOGLOBIN A1C
Hgb A1c MFr Bld: 5.4 % (ref 4.8–5.6)
Mean Plasma Glucose: 108.28 mg/dL

## 2022-11-20 LAB — BRAIN NATRIURETIC PEPTIDE: B Natriuretic Peptide: 52.9 pg/mL (ref 0.0–100.0)

## 2022-11-20 NOTE — Progress Notes (Signed)
[facility]                 PCP: Alysia Penna NP HF Cardiologist: Dr. Gala Romney    Reason for Visit: Follow up for Chronic Systolic Heart Failure  HPI: Robert [name] is a 42 y.o. male with h/o heavy ETOH and cocaine use, PE,  biventricular systolic heart failure and tobacco abuse.   Admitted with acute onset biventricular HF in 5/21. EF 15%. He was drinking at least 2 beers every night and about 12-24 over the weekend. Also drinking 1/5 of liqour every other day. Cath showed normal coronaries. Elevated filling pressures with normal output. Required milrinone. 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 CT showed small LLL PE. Placed on xarelto. Started on HF meds.  Discharge weight 178 pounds.   Readmitted 6/21 for mild acute blood loss related anemia. EGD showed no active bleeding but evidence of some gastritis. PPI increased. Repeat EGD/colonoscopy 1/23 had 1 small polyp removed, normal esophagus.  Has had numerous ED visits and admits for ETOH withdrawal and AKI. Reports stopping ETOH in 7/22  Echo 10/21 EF 30-35% mild RV HK.  Echo 3/23 EF 40-45%   Follow up 5/23, lasix switched form daily to PRN.  Echo 9/23 EF 40-45%, RV normal.  Follow up 11/23, Zio placed for palpitations. 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   Today he returns for HF follow up. Works Teacher, English as a foreign language at [employer]. Walks a lot. Exercising regularly with push-ups and weights. No CP, SOB or edema. No ETOH since 12/09/20. Compliant with meds. Trying to quit smoking 1-2 cigs/day. Working on Clinical research associate.    Zio 1/24 1 brief run NSVT.   Cardiac studies - Echo (5/21): EF <20%  Prominent trabeculation. Grade III DD  - R/LHC (5/21): showed normal coronaries, elevated filling pressures with normal output.  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 No tamponade physiology.  - Echo (10/21): EF 30-35% RV normal. No effusion.  - Echo (2/22): EF 30% - Echo (9/23): EF 40-45%, RV normal   ROS: All systems negative except as listed in HPI, PMH and Problem List.  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  Tobacco Use   Smoking status: Some Days    Packs/day: 0.25    Years: 24.00    Additional pack years: 0.00    Total pack years: 6.00    Types: Cigarettes   Smokeless tobacco: Never  Vaping Use   Vaping Use: Some days  Substance and Sexual Activity   Alcohol use: Not Currently    Alcohol/week: 8.0 standard drinks of alcohol    Types: 8 Shots of liquor per week   Drug use: Not Currently   Sexual activity: Yes  Other Topics Concern   Not on file  Social History Narrative   Not on file   Social Determinants of Health   Financial Resource Strain: High Risk (11/03/2019)   Overall Financial Resource Strain (CARDIA)    Difficulty of Paying Living Expenses: Very hard  Food Insecurity: Food Insecurity Present (11/03/2019)   Hunger Vital Sign  Worried About Programme researcher, broadcasting/film/video in the Last Year: Sometimes true    Barista in the Last Year: Sometimes true  Transportation Needs: Not on file  Physical Activity: Not on file  Stress: Not on file  Social Connections: Not on file  Intimate Partner Violence: Not on file   FH:  Family History  Problem Relation Age of Onset   Heart failure Father    Diabetes Father    Kidney disease Father    Heart disease Father 69   Heart disease Maternal Grandfather 22   Heart attack Maternal Grandfather    Heart disease Paternal Grandfather 61   Lung cancer Maternal Aunt    Past Medical History:  Diagnosis Date   AKI (acute kidney injury) (HCC) 10/30/2019    Alcohol use    Alcohol withdrawal (HCC) 03/06/2020   Anxiety    Asthma    CHF (congestive heart failure) (HCC)    Depression    GERD (gastroesophageal reflux disease)    Pulmonary embolism (HCC)    Seizure (HCC)    Current Outpatient Medications  Medication Sig Dispense Refill   albuterol (VENTOLIN HFA) 108 (90 Base) MCG/ACT inhaler Inhale 1-2 puffs into the lungs every 6 (six) hours as needed for wheezing or shortness of breath. 18 g 0   carvedilol (COREG) 6.25 MG tablet Take 1 tablet (6.25 mg total) by mouth 2 (two) times daily. 180 tablet 3   dapagliflozin propanediol (FARXIGA) 10 MG TABS tablet Take 1 tablet (10 mg total) by mouth daily before breakfast. 90 tablet 3   Menthol, Topical Analgesic, (BIOFREEZE EX) Apply 1 application topically daily as needed (pain).     Naphazoline HCl (CLEAR EYES OP) Place 1 drop into both eyes daily as needed (redness/ irritation).     promethazine-dextromethorphan (PROMETHAZINE-DM) 6.25-15 MG/5ML syrup Take 2.5 mLs by mouth 3 (three) times daily as needed for cough. 100 mL 0   sacubitril-valsartan (ENTRESTO) 97-103 MG Take 1 tablet by mouth 2 (two) times daily. 60 tablet 0   spironolactone (ALDACTONE) 25 MG tablet Take 0.5 tablets (12.5 mg total) by mouth daily. 45 tablet 3   nicotine (NICODERM CQ - DOSED IN MG/24 HOURS) 21 mg/24hr patch Place 21 mg onto the skin daily as needed (nicotine dependence). (Patient not taking: Reported on 11/20/2022)     No current facility-administered medications for this encounter.   Wt Readings from Last 3 Encounters:  11/20/22 87.9 kg (193 lb 12.8 oz)  08/09/22 82.6 kg (182 lb 3.2 oz)  05/01/22 83.4 kg (183 lb 12.8 oz)   BP 120/78   Pulse 79   Wt 87.9 kg (193 lb 12.8 oz)   SpO2 99%   BMI 27.81 kg/m   PHYSICAL EXAM: General:  Well appearing. No resp difficulty HEENT: normal Neck: supple. no JVD. Carotids 2+ bilat; no bruits. No lymphadenopathy or thryomegaly appreciated. Cor: PMI nondisplaced. Regular rate &  rhythm. No rubs, gallops or murmurs. Lungs: clear Abdomen: soft, nontender, nondistended. No hepatosplenomegaly. No bruits or masses. Good bowel sounds. Extremities: no cyanosis, clubbing, rash, edema Neuro: alert & orientedx3, cranial nerves grossly intact. moves all 4 extremities w/o difficulty. Affect pleasant   ECG NSR 77 non-specific TWI  Personally reviewed   ASSESSMENT & PLAN:  1. Chronic Systolic Heart Failure, NICM  - Echo (5/21): EF 15% RV moderately reduced. Suspect ETOH and cocaine playing a role.  - LHC (5/21): normal cors. RHC with preserved cardiac output and elevated filling pressures - cMRI (5/21): EF  16% RVEF 14% + noncompaction.  - Echo (10/21): EF 30-35% RV normal. No effusion.  - Echo (2/22): EF 30%  - Echo 3/23 EF 40-45%  - Echo 9/23 EF 40-45%, RV normal - Echo 5/24 EF 50-55% RV ok  EF now normal  - Doing great. NYHA I Volume ok  - Continue Lasix 20 mg PRN  - Continue spiro 12.5 mg daily. - Continue Entresto 97-103 mg bid.  - Continue Coreg 6.25 mg bid.  - Continue Farxiga 10 mg daily. - Continue Corlanor 5 mg bid.  - Referred for genetic counseling with Dr. Jomarie Longs. 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 - Labs today  2. H/o Pulmonary Emboli - CTA w/ small LLL PE 5/21. - LE Venous dopplers negative for DVT.  - Given small PE. Risk of ongoing AC likely outweighs benefit. - Off Xarelto  3. ETOH Abuse - Heavy drinker for many years since the age of 65.  - 01/2020 had 28 day detox [facility] but started drinking again.  - Has remained sober since 11/2020. No change.   4. H/o Cocaine Abuse - Denies recent use.  - UDS 10/08/20 negative.  5. Tobacco Abuse - Down to 1-2 cig/daily. - Working on quitting.  6. Palpitations  - Zio 1/24 ok   Arvilla Meres, MD 11/20/22 3:24 PM

## 2022-11-20 NOTE — Patient Instructions (Signed)
Medication Changes:  No Changes In Medications at this time.   Lab Work:  Labs done today, your results will be available in MyChart, we will contact you for abnormal readings.   Testing/Procedures:  Your physician has requested that you have an echocardiogram IN 6 MONTHS. Echocardiography is a painless test that uses sound waves to create images of your heart. It provides your doctor with information about the size and shape of your heart and how well your heart's chambers and valves are working. You may receive an ultrasound enhancing agent through an IV if needed to better visualize your heart during the echo.This procedure takes approximately one hour. There are no restrictions for this procedure.   Follow-Up in: 6 MONTHS WITH DR. Gala Romney PLEASE CALL OUR OFFICE AROUND NOVEMBER 2024 TO GET SCHEDULED FOR YOUR APPOINTMENT. PHONE NUMBER IS [phone] OPTION 2    At the [facility], you and your health needs are our priority. We have a designated team specialized in the treatment of Heart Failure. This Care Team includes your primary Heart Failure Specialized Cardiologist (physician), Advanced Practice Providers (APPs- Physician Assistants and Nurse Practitioners), and Pharmacist who all work together to provide you with the care you need, when you need it.   You may see any of the following providers on your designated Care Team at your next follow up:  Dr. Arvilla Meres Dr. Marca Ancona Dr. Marcos Eke, NP Robbie Lis, [place] [facility] [place], [place] Brynda Peon, NP Karle Plumber, PharmD   Please be sure to bring in all your medications bottles to every appointment.   Need to Contact us:  If you have any questions or concerns before your next appointment please send [place] a message through [place] or call our office at [phone].    TO LEAVE A MESSAGE FOR THE NURSE SELECT OPTION 2, PLEASE LEAVE A MESSAGE INCLUDING: YOUR  NAME DATE OF BIRTH CALL BACK NUMBER REASON FOR CALL**this is important as we prioritize the call backs  YOU WILL RECEIVE A CALL BACK THE SAME DAY AS LONG AS YOU CALL BEFORE 4:00 PM

## 2022-12-20 ENCOUNTER — Telehealth (HOSPITAL_COMMUNITY): Payer: Self-pay

## 2022-12-20 NOTE — Telephone Encounter (Signed)
Received a fax requesting medical records from [facility]. Records were successfully faxed to: [phone] ,which was the number provided.. Medical request form will be scanned into patients chart. Continuation of care.

## 2022-12-28 DIAGNOSIS — U071 COVID-19: Secondary | ICD-10-CM | POA: Diagnosis not present

## 2023-01-24 ENCOUNTER — Other Ambulatory Visit (HOSPITAL_COMMUNITY): Payer: Self-pay

## 2023-01-24 MED ORDER — SPIRONOLACTONE 25 MG PO TABS: 12.5000 mg | ORAL_TABLET | Freq: Every day | ORAL | 3 refills | Status: DC

## 2023-01-24 MED ORDER — ENTRESTO 97-103 MG PO TABS: 1.0000 | ORAL_TABLET | Freq: Two times a day (BID) | ORAL | 3 refills | Status: AC

## 2023-01-24 MED ORDER — CARVEDILOL 6.25 MG PO TABS: 6.2500 mg | ORAL_TABLET | Freq: Two times a day (BID) | ORAL | 3 refills | Status: DC

## 2023-01-24 MED ORDER — DAPAGLIFLOZIN PROPANEDIOL 10 MG PO TABS: 10.0000 mg | ORAL_TABLET | Freq: Every day | ORAL | 3 refills | Status: AC

## 2023-01-24 NOTE — Telephone Encounter (Signed)
Meds ordered this encounter  Medications   dapagliflozin propanediol (FARXIGA) 10 MG TABS tablet    Sig: Take 1 tablet (10 mg total) by mouth daily before breakfast.    Dispense:  90 tablet    Refill:  3   sacubitril-valsartan (ENTRESTO) 97-103 MG    Sig: Take 1 tablet by mouth 2 (two) times daily.    Dispense:  180 tablet    Refill:  3   spironolactone (ALDACTONE) 25 MG tablet    Sig: Take 0.5 tablets (12.5 mg total) by mouth daily.    Dispense:  45 tablet    Refill:  3   carvedilol (COREG) 6.25 MG tablet    Sig: Take 1 tablet (6.25 mg total) by mouth 2 (two) times daily.    Dispense:  180 tablet    Refill:  3    Please cancel all previous orders for current medication. Change in dosage or pill size.

## 2023-02-03 ENCOUNTER — Other Ambulatory Visit (HOSPITAL_COMMUNITY): Payer: Self-pay

## 2023-02-19 DIAGNOSIS — Z6830 Body mass index (BMI) 30.0-30.9, adult: Secondary | ICD-10-CM | POA: Diagnosis not present

## 2023-02-19 DIAGNOSIS — J3489 Other specified disorders of nose and nasal sinuses: Secondary | ICD-10-CM | POA: Diagnosis not present

## 2023-02-19 DIAGNOSIS — Z3141 Encounter for fertility testing: Secondary | ICD-10-CM | POA: Diagnosis not present

## 2023-04-01 DIAGNOSIS — L700 Acne vulgaris: Secondary | ICD-10-CM | POA: Diagnosis not present

## 2023-04-01 DIAGNOSIS — L810 Postinflammatory hyperpigmentation: Secondary | ICD-10-CM | POA: Diagnosis not present

## 2023-04-28 ENCOUNTER — Ambulatory Visit
Admission: EM | Admit: 2023-04-28 | Discharge: 2023-04-28 | Payer: BC Managed Care – PPO | Attending: Physician Assistant | Admitting: Physician Assistant

## 2023-04-28 DIAGNOSIS — R0789 Other chest pain: Secondary | ICD-10-CM | POA: Diagnosis not present

## 2023-04-28 DIAGNOSIS — R079 Chest pain, unspecified: Secondary | ICD-10-CM | POA: Diagnosis not present

## 2023-04-28 DIAGNOSIS — R9431 Abnormal electrocardiogram [ECG] [EKG]: Secondary | ICD-10-CM | POA: Diagnosis not present

## 2023-04-28 NOTE — Discharge Instructions (Signed)
You have been advised to follow up immediately in the emergency department for concerning signs.symptoms. If you declined EMS transport, please have a family member take you directly to the ED at this time. Do not delay. Based on concerns about condition, if you do not follow up in th e ED, you may risk poor outcomes including worsening of condition, delayed treatment and potentially life threatening issues. If you have declined to go to the ED at this time, you should call your PCP immediately to set up a follow up appointment. ° °Go to ED for red flag symptoms, including; fevers you cannot reduce with Tylenol/Motrin, severe headaches, vision changes, numbness/weakness in part of the body, lethargy, confusion, intractable vomiting, severe dehydration, chest pain, breathing difficulty, severe persistent abdominal or pelvic pain, signs of severe infection (increased redness, swelling of an area), feeling faint or passing out, dizziness, etc. You should especially go to the ED for sudden acute worsening of condition if you do not elect to go at this time.  °

## 2023-04-28 NOTE — ED Provider Notes (Signed)
42 year old male with history of CHF, tobacco abuse, alcohol abuse, polysubstance abuse and PE presents for 1 week history of substernal and left-sided chest pain which is intermittent.  Reports occasional pain in his right neck and shoulder.  No associated shortness of breath, weakness or dizziness.  He does feel little fatigued.  Not currently having any chest discomfort but did earlier this morning.  He says he followed up with his cardiologist in the summer and reports that his ejection fraction is up to 50.  Vitals are stable.  Blood pressure 149/97.  He is overall well-appearing.  Heart regular rate and rhythm.  Chest clear.  EKG performed today shows abnormalities.  It does show normal sinus rhythm with bradycardia and ST and T wave abnormality.  In the inferior/lateral leads he has larger T wave inversion then he had during the July 2024 EKG.  Given these changes in his health history advised further and immediate evaluation in the emergency department.  Wife to take him immediately to [facility] ED [facility].  He is leaving in stable condition and declined EMS transport.   Shirlee Latch, PA-C 04/28/23 1118

## 2023-04-28 NOTE — ED Notes (Signed)
Patient is being discharged from the Urgent Care and sent to the Emergency Department via personal vehicle . Per Athena Masse PA, patient is in need of higher level of care due to chest pain. Patient is aware and verbalizes understanding of plan of care.  Vitals:   04/28/23 1044  BP: (!) 149/97  Pulse: 66  Resp: 19  Temp: 98.2 F (36.8 C)  SpO2: 100%

## 2023-04-28 NOTE — ED Triage Notes (Addendum)
Neck shoulder that started Tuesday. Chest pain comes since Tuesday. Does have a cardiologist but didn't contact them. Patient didn't come in because he thought it was a panic attack.

## 2023-04-29 ENCOUNTER — Encounter (HOSPITAL_COMMUNITY): Payer: Self-pay | Admitting: Internal Medicine

## 2023-04-29 ENCOUNTER — Ambulatory Visit (HOSPITAL_COMMUNITY)
Admission: RE | Admit: 2023-04-29 | Discharge: 2023-04-29 | Disposition: A | Discharge status: Home / Self Care | Payer: BC Managed Care – PPO | Source: Ambulatory Visit | Attending: Internal Medicine | Admitting: Internal Medicine

## 2023-04-29 VITALS — BP 144/104 | HR 71 | Ht 70.0 in | Wt 207.8 lb

## 2023-04-29 DIAGNOSIS — F1010 Alcohol abuse, uncomplicated: Secondary | ICD-10-CM

## 2023-04-29 DIAGNOSIS — M542 Cervicalgia: Secondary | ICD-10-CM | POA: Diagnosis not present

## 2023-04-29 DIAGNOSIS — Z86711 Personal history of pulmonary embolism: Secondary | ICD-10-CM | POA: Insufficient documentation

## 2023-04-29 DIAGNOSIS — I428 Other cardiomyopathies: Secondary | ICD-10-CM | POA: Insufficient documentation

## 2023-04-29 DIAGNOSIS — F1011 Alcohol abuse, in remission: Secondary | ICD-10-CM | POA: Insufficient documentation

## 2023-04-29 DIAGNOSIS — F17210 Nicotine dependence, cigarettes, uncomplicated: Secondary | ICD-10-CM | POA: Diagnosis not present

## 2023-04-29 DIAGNOSIS — I5082 Biventricular heart failure: Secondary | ICD-10-CM | POA: Diagnosis not present

## 2023-04-29 DIAGNOSIS — R9431 Abnormal electrocardiogram [ECG] [EKG]: Secondary | ICD-10-CM | POA: Insufficient documentation

## 2023-04-29 DIAGNOSIS — R0789 Other chest pain: Secondary | ICD-10-CM | POA: Diagnosis not present

## 2023-04-29 DIAGNOSIS — I5022 Chronic systolic (congestive) heart failure: Secondary | ICD-10-CM | POA: Diagnosis not present

## 2023-04-29 DIAGNOSIS — I110 Hypertensive heart disease with heart failure: Secondary | ICD-10-CM | POA: Diagnosis not present

## 2023-04-29 DIAGNOSIS — F1411 Cocaine abuse, in remission: Secondary | ICD-10-CM | POA: Insufficient documentation

## 2023-04-29 DIAGNOSIS — I10 Essential (primary) hypertension: Secondary | ICD-10-CM

## 2023-04-29 DIAGNOSIS — Z720 Tobacco use: Secondary | ICD-10-CM | POA: Diagnosis not present

## 2023-04-29 DIAGNOSIS — R002 Palpitations: Secondary | ICD-10-CM | POA: Diagnosis not present

## 2023-04-29 MED ORDER — CARVEDILOL 12.5 MG PO TABS: 12.5000 mg | ORAL_TABLET | Freq: Two times a day (BID) | ORAL | 6 refills | Status: AC

## 2023-04-29 MED ORDER — SPIRONOLACTONE 25 MG PO TABS: 25.0000 mg | ORAL_TABLET | Freq: Every day | ORAL | 3 refills | Status: AC

## 2023-04-29 NOTE — Progress Notes (Addendum)
[facility]                 PCP: Alysia Penna NP HF Cardiologist: Dr. Gala Romney    Reason for Visit: Follow up for Chronic Systolic Heart Failure  HPI: Robert [name] is a 42 y.o. male with h/o heavy ETOH and cocaine use, PE,  biventricular systolic heart failure and tobacco abuse.   Admitted with acute onset biventricular HF in 5/21. EF 15%. He was drinking at least 2 beers every night and about 12-24 over the weekend. Also drinking 1/5 of liqour every other day. Cath showed normal coronaries. Elevated filling pressures with normal output. Required milrinone. 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 CT showed small LLL PE. Placed on xarelto. Started on HF meds.  Discharge weight 178 pounds.   Readmitted 6/21 for mild acute blood loss related anemia. EGD showed no active bleeding but evidence of some gastritis. PPI increased. Repeat EGD/colonoscopy 1/23 had 1 small polyp removed, normal esophagus.  Has had numerous ED visits and admits for ETOH withdrawal and AKI. Reports stopping ETOH in 7/22  Echo 10/21 EF 30-35% mild RV HK.  Echo 3/23 EF 40-45%   Follow up 5/23, lasix switched form daily to PRN.  Echo 9/23 EF 40-45%, RV normal.  Follow up 11/23, Zio placed for palpitations. 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   Presents for ED f/u visit. Has been doing well. 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 Has had neck pain going down to shoulder for several weeks. Yesterday was in car and pain was also in his chest. Went to UC and then sent to ER. Hs trop 3 x 2. BNP 52. ECG with anterolateral TWI more prominent than previous.Denies DOE, edema, orthopnea or PND. Compliant with HF meds. No ETOH/drug use.    Zio 1/24 1 brief run NSVT.   Cardiac studies - Echo (5/21): EF <20%  Prominent trabeculation. Grade III DD  - R/LHC (5/21): showed normal coronaries, elevated filling pressures with normal output.  -  cMRI (5/21): showed Noncompaction- LVEF 14% RVEF 16%  Severely dialted R/L [facility] - Echo (6/21): Pericardial effusion remains moderate in size. No tamponade physiology.  - Echo (10/21): EF 30-35% RV normal. No effusion.  - Echo (2/22): EF 30% - Echo (9/23): EF 40-45%, RV normal   ROS: All systems negative except as listed in HPI, PMH and Problem List.  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  Tobacco Use   Smoking status: Some Days    Current packs/day: 0.25    Average packs/day: 0.3 packs/day for 24.0 years (6.0 ttl pk-yrs)    Types: Cigarettes    Passive exposure: Current   Smokeless tobacco: Never  Vaping Use   Vaping status: Some Days  Substance and Sexual Activity   Alcohol use: Not Currently    Alcohol/week: 8.0 standard drinks of alcohol    Types: 8 Shots of liquor per week   Drug use: Not Currently   Sexual activity: Yes  Other Topics Concern   Not on file  Social History Narrative   Not on file   Social Drivers of Health   Financial Resource Strain: High Risk (11/03/2019)  Overall Financial Resource Strain (CARDIA)    Difficulty of Paying Living Expenses: Very hard  Food Insecurity: Food Insecurity Present (11/03/2019)   Hunger Vital Sign    Worried About Running Out of Food in the Last Year: Sometimes true    Ran Out of Food in the Last Year: Sometimes true  Transportation Needs: Not on file  Physical Activity: Not on file  Stress: Not on file  Social Connections: Not on file  Intimate Partner Violence: Not on file   FH:  Family History  Problem Relation Age of Onset   Heart failure Father    Diabetes Father    Kidney disease Father    Heart disease Father 52   Heart disease Maternal Grandfather 51   Heart attack Maternal  Grandfather    Heart disease Paternal Grandfather 77   Lung cancer Maternal Aunt    Past Medical History:  Diagnosis Date   AKI (acute kidney injury) (HCC) 10/30/2019   Alcohol use    Alcohol withdrawal (HCC) 03/06/2020   Anxiety    Asthma    CHF (congestive heart failure) (HCC)    Depression    GERD (gastroesophageal reflux disease)    Pulmonary embolism (HCC)    Seizure (HCC)    Current Outpatient Medications  Medication Sig Dispense Refill   albuterol (VENTOLIN HFA) 108 (90 Base) MCG/ACT inhaler Inhale 1-2 puffs into the lungs every 6 (six) hours as needed for wheezing or shortness of breath. 18 g 0   carvedilol (COREG) 6.25 MG tablet Take 1 tablet (6.25 mg total) by mouth 2 (two) times daily. 180 tablet 3   dapagliflozin propanediol (FARXIGA) 10 MG TABS tablet Take 1 tablet (10 mg total) by mouth daily before breakfast. 90 tablet 3   Menthol, Topical Analgesic, (BIOFREEZE EX) Apply 1 application topically daily as needed (pain).     Naphazoline HCl (CLEAR EYES OP) Place 1 drop into both eyes daily as needed (redness/ irritation).     nicotine (NICODERM CQ - DOSED IN MG/24 HOURS) 21 mg/24hr patch Place 21 mg onto the skin daily as needed (nicotine dependence).     sacubitril-valsartan (ENTRESTO) 97-103 MG Take 1 tablet by mouth 2 (two) times daily. 180 tablet 3   spironolactone (ALDACTONE) 25 MG tablet Take 0.5 tablets (12.5 mg total) by mouth daily. 45 tablet 3   No current facility-administered medications for this encounter.   Wt Readings from Last 3 Encounters:  04/29/23 94.3 kg (207 lb 12.8 oz)  11/20/22 87.9 kg (193 lb 12.8 oz)  08/09/22 82.6 kg (182 lb 3.2 oz)   BP (!) 144/104   Pulse 71   Ht 5\' 10"  (1.778 m)   Wt 94.3 kg (207 lb 12.8 oz)   SpO2 100%   BMI 29.82 kg/m   PHYSICAL EXAM: General:  Well appearing. No resp difficulty HEENT: normal Neck: supple. no JVD. Carotids 2+ bilat; no bruits. No lymphadenopathy or thryomegaly appreciated. Cor: PMI nondisplaced.  Regular rate & rhythm. No rubs, gallops or murmurs. Lungs: clear Abdomen: soft, nontender, nondistended. No hepatosplenomegaly. No bruits or masses. Good bowel sounds. Extremities: no cyanosis, clubbing, rash, edema Neuro: alert & orientedx3, cranial nerves grossly intact. moves all 4 extremities w/o difficulty. Affect pleasant    ECG NSR 81 LVH with diffuse repol Personally reviewed   ASSESSMENT & PLAN:  1. Chest, neck and shoulder pain - pain is atypical. Had normal cors on cath 2021 and hstrop negative. Doubt cardiac (? Cervical disc disease) - reassured him  2. Chronic Systolic Heart Failure, NICM  - Echo (5/21): EF 15% RV moderately reduced. Suspect ETOH and cocaine playing a role.  - LHC (5/21): normal cors. 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  - Echo (10/21): EF 30-35% RV normal. No effusion.  - Echo (2/22): EF 30%  - Echo 3/23 EF 40-45%  - Echo 9/23 EF 40-45%, RV normal - Echo 5/24 EF 50-55% RV ok  EF now normal  - Doing well. NYHA I. Volume ok  - Continue Lasix 20 mg PRN  - Increase spiro to 25 daily  - Continue Entresto 97-103 mg bid.  - Increase carvedilol to 12.5 bid mg bid.  - Continue Farxiga 10 mg daily. - Referred for genetic counseling with Dr. Jomarie Longs. 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  2. H/o Pulmonary Emboli - CTA w/ small LLL PE 5/21. - LE Venous dopplers negative for DVT.  - Given small PE. Risk of ongoing AC likely outweighs benefit. - Off Xarelto  3. ETOH Abuse - Heavy drinker for many years since the age of 58.  - 01/2020 had 28 day detox [facility] but started drinking again.  - Has remained sober since 11/2020.  - No change  4. H/o Cocaine Abuse - Denies recent use.  - UDS 10/08/20 negative.  5. Tobacco Abuse - Down to 1-2 cig/daily. - Working on quitting  6. Palpitations  - Zio 1/24 ok   7. HTN -  BP up. Increase meds as above  Arvilla Meres, MD 04/29/23 12:03 PM

## 2023-04-29 NOTE — Patient Instructions (Signed)
Medication Changes:  INCREASE Spironolactone to 25 mg (1 tab) Daily  INCREASE Carvedilol to 12.5 mg Twice daily   Lab Work:  none  Testing/Procedures:  Your physician has requested that you have an echocardiogram. Echocardiography is a painless test that uses sound waves to create images of your heart. It provides your doctor with information about the size and shape of your heart and how well your heart's chambers and valves are working. This procedure takes approximately one hour. There are no restrictions for this procedure. Please do NOT wear cologne, perfume, aftershave, or lotions (deodorant is allowed). Please arrive 15 minutes prior to your appointment time.  Please note: We ask at that you not bring children with you during ultrasound (echo/ vascular) testing. Due to room size and safety concerns, children are not allowed in the ultrasound rooms during exams. Our front office staff cannot provide observation of children in our lobby area while testing is being conducted. An adult accompanying a patient to their appointment will only be allowed in the ultrasound room at the discretion of the ultrasound technician under special circumstances. We apologize for any inconvenience.   Referrals:  none  Special Instructions // Education:  Do the following things EVERYDAY: Weigh yourself in the morning before breakfast. Write it down and keep it in a log. Take your medicines as prescribed Eat low salt foods--Limit salt (sodium) to 2000 mg per day.  Stay as active as you can everyday Limit all fluids for the day to less than 2 liters   Follow-Up in: 4 months (April 2025), **PLEASE CALL OUR OFFICE FEBRUARY TO SCHEDULE THIS APPOINTMENT   At the [facility], you and your health needs are our priority. We have a designated team specialized in the treatment of Heart Failure. This Care Team includes your primary Heart Failure Specialized Cardiologist (physician),  Advanced Practice Providers (APPs- Physician Assistants and Nurse Practitioners), and Pharmacist who all work together to provide you with the care you need, when you need it.   You may see any of the following providers on your designated Care Team at your next follow up:  Dr. Arvilla Meres Dr. Marca Ancona Dr. Dorthula Nettles Dr. Theresia Bough Tonye Becket, NP Robbie Lis, [facility], [place] Brynda Peon, NP [place] Lee, NP Karle Plumber, PharmD   Please be sure to bring in all your medications bottles to every appointment.   Need to Contact us:  If you have any questions or concerns before your next appointment please send [place] a message through [place] or call our office at [phone].    TO LEAVE A MESSAGE FOR THE NURSE SELECT OPTION 2, PLEASE LEAVE A MESSAGE INCLUDING: YOUR NAME DATE OF BIRTH CALL BACK NUMBER REASON FOR CALL**this is important as we prioritize the call backs  YOU WILL RECEIVE A CALL BACK THE SAME DAY AS LONG AS YOU CALL BEFORE 4:00 PM

## 2023-05-16 ENCOUNTER — Telehealth (HOSPITAL_COMMUNITY): Payer: Self-pay

## 2023-05-16 NOTE — Telephone Encounter (Signed)
 Received call from patient stating that he needs his leave of absence forms filled out- checked Dr. Prescott Gum box do not see these. Provided patient with fax number to have them resent to [place].  Patient aware and verbalized understanding.

## 2023-05-27 ENCOUNTER — Ambulatory Visit (HOSPITAL_COMMUNITY)
Admission: RE | Admit: 2023-05-27 | Discharge: 2023-05-27 | Disposition: A | Discharge status: Home / Self Care | Payer: BC Managed Care – PPO | Source: Ambulatory Visit | Attending: Internal Medicine | Admitting: Internal Medicine

## 2023-05-27 ENCOUNTER — Encounter (HOSPITAL_COMMUNITY): Payer: Self-pay | Admitting: *Deleted

## 2023-05-27 DIAGNOSIS — I5022 Chronic systolic (congestive) heart failure: Secondary | ICD-10-CM

## 2023-05-27 LAB — ECHOCARDIOGRAM COMPLETE
Area-P 1/2: 10.84 cm2
Calc EF: 45.7 %
S' Lateral: 5.2 cm
Single Plane A2C EF: 42.4 %
Single Plane A4C EF: 50.2 %

## 2023-05-27 MED ORDER — PERFLUTREN LIPID MICROSPHERE
1.0000 mL | INTRAVENOUS | Status: AC | PRN
  Administered 2023-05-27: 1 mL via INTRAVENOUS

## 2023-06-03 NOTE — Telephone Encounter (Signed)
Disability forms completed, signed by Dr Gala Romney and faxed into [facility] on 05/29/23, pt is aware this was done

## 2023-08-05 ENCOUNTER — Telehealth (HOSPITAL_COMMUNITY): Payer: Self-pay | Admitting: *Deleted

## 2023-08-05 NOTE — Telephone Encounter (Signed)
 Called patient and left message reminding him of appointment in [facility] tomorrow at 9:30. Asked patient to call us at [phone] if he cannot keep appointment.

## 2023-08-06 ENCOUNTER — Encounter (HOSPITAL_COMMUNITY)

## 2023-08-25 ENCOUNTER — Telehealth (HOSPITAL_COMMUNITY): Payer: Self-pay

## 2023-08-25 NOTE — Telephone Encounter (Signed)
 Called to confirm/remind patient of their appointment at the [facility] on 08/26/23.   Appointment:   [] Confirmed  [] Left mess   [x] No answer/No voice mail  [] Phone not in service  And to bring in all medications and/or complete list.

## 2023-08-26 ENCOUNTER — Encounter (HOSPITAL_COMMUNITY): Payer: BC Managed Care – PPO

## 2024-06-03 ENCOUNTER — Ambulatory Visit
Admission: EM | Admit: 2024-06-03 | Discharge: 2024-06-03 | Disposition: A | Discharge status: Home / Self Care | Payer: Self-pay | Attending: Physician Assistant | Admitting: Physician Assistant

## 2024-06-03 ENCOUNTER — Ambulatory Visit (HOSPITAL_COMMUNITY): Payer: Self-pay

## 2024-06-03 ENCOUNTER — Ambulatory Visit: Payer: Self-pay

## 2024-06-03 DIAGNOSIS — J45909 Unspecified asthma, uncomplicated: Secondary | ICD-10-CM

## 2024-06-03 DIAGNOSIS — R0602 Shortness of breath: Secondary | ICD-10-CM

## 2024-06-03 DIAGNOSIS — R051 Acute cough: Secondary | ICD-10-CM

## 2024-06-03 DIAGNOSIS — R197 Diarrhea, unspecified: Secondary | ICD-10-CM

## 2024-06-03 DIAGNOSIS — J069 Acute upper respiratory infection, unspecified: Secondary | ICD-10-CM

## 2024-06-03 LAB — POC COVID19/FLU A&B COMBO
Covid Antigen, POC: NEGATIVE
Influenza A Antigen, POC: NEGATIVE
Influenza B Antigen, POC: NEGATIVE

## 2024-06-03 MED ORDER — PROMETHAZINE-DM 6.25-15 MG/5ML PO SYRP: 5.0000 mL | ORAL_SOLUTION | Freq: Four times a day (QID) | ORAL | 0 refills | Status: AC | PRN

## 2024-06-03 NOTE — ED Provider Notes (Signed)
 " [facility]    CSN: [id number] Arrival date & time: 06/03/24  1134      History   Chief Complaint Chief Complaint  Patient presents with   Cough   Diarrhea    HPI Robert [name] is a 44 y.o. male presenting for fatigue, cough, congestion, headaches, shortness of breath, diarrhea and abdominal cramping since yesterday. Denies fever, sore throat, ear pain, sinus pain, chest pain, wheezing, and vomiting.  Patient has been taking over-the-counter Tylenol  and Mucinex. Multiple sick contacts. No known COVID/flu exposure. No other complaints.   Patient's medical history significant for asthma, CHF, history of PE, GERD, seizures, polysubstance abuse.  HPI  Past Medical History:  Diagnosis Date   AKI (acute kidney injury) 10/30/2019   Alcohol  use    Alcohol  withdrawal (HCC) 03/06/2020   Anxiety    Asthma    CHF (congestive heart failure) (HCC)    Depression    GERD (gastroesophageal reflux disease)    Pulmonary embolism (HCC)    Seizure (HCC)     Patient Active Problem List   Diagnosis Date Noted   Iron  deficiency anemia due to chronic blood loss    Gastric nodule    Adenomatous polyp of ascending colon    Blood in the stool    Adjustment insomnia 02/19/2021   Episodic tension-type headache, not intractable 02/19/2021   Hypokalemia 11/20/2020   Sepsis without acute organ dysfunction (HCC)    Pneumonia 10/08/2020   Severe sepsis (HCC) 10/08/2020   Delirium tremens (HCC) 09/02/2020   Hypomagnesemia 08/19/2020   Seizure (HCC) 07/10/2020   Mood disorder 05/16/2020   Hypotension 03/24/2020   Polysubstance abuse (HCC) 03/24/2020   Gastritis 10/30/2019   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)    Acute heart failure (HCC) 10/05/2019   Pulmonary embolism (HCC) 10/04/2019   Chronic systolic CHF (congestive heart failure) (HCC)    Inappropriate sinus tachycardia    Chest pain 11/03/2013   Tobacco abuse 11/03/2013    Past Surgical  History:  Procedure Laterality Date   BIOPSY  05/31/2021   Procedure: BIOPSY;  Surgeon: Eda Iha, MD;  Location: [facility] ENDOSCOPY;  Service: Gastroenterology;;   COLONOSCOPY WITH PROPOFOL  N/A 05/31/2021   Procedure: COLONOSCOPY WITH PROPOFOL ;  Surgeon: Eda Iha, MD;  Location: [facility];  Service: Gastroenterology;  Laterality: N/A;   ESOPHAGOGASTRODUODENOSCOPY (EGD) WITH PROPOFOL  N/A 11/01/2019   Procedure: ESOPHAGOGASTRODUODENOSCOPY (EGD) WITH PROPOFOL ;  Surgeon: Lennard Lesta FALCON, MD;  Location: [facility] ENDOSCOPY;  Service: Endoscopy;  Laterality: N/A;   ESOPHAGOGASTRODUODENOSCOPY (EGD) WITH PROPOFOL  N/A 05/31/2021   Procedure: ESOPHAGOGASTRODUODENOSCOPY (EGD) WITH PROPOFOL ;  Surgeon: Eda Iha, MD;  Location: [facility] ENDOSCOPY;  Service: Gastroenterology;  Laterality: N/A;   POLYPECTOMY  05/31/2021   Procedure: POLYPECTOMY;  Surgeon: Eda Iha, MD;  Location: [facility] END[facility];  Service: Gastroenterology;;   RIGHT/LEFT HEART CATH AND CORONARY ANGIOGRAPHY N/A 10/06/2019   Procedure: RIGHT/LEFT HEART CATH AND CORONARY ANGIOGRAPHY;  Surgeon: Burnard Debby LABOR, MD;  Location: [facility] INVASIVE CV LAB;  Service: Cardiovascular;  Laterality: N/A;   WRIST SURGERY Right    age17       Home Medications    Prior to Admission medications  Medication Sig Start Date End Date Taking? Authorizing Provider  promethazine -dextromethorphan (PROMETHAZINE -DM) 6.25-15 MG/5ML syrup Take 5 mLs by mouth 4 (four) times daily as needed. 06/03/24  Yes Arvis Jolan NOVAK, PA-C  albuterol  (VENTOLIN  HFA) 108 (90 Base) MCG/ACT inhaler Inhale 1-2 puffs into the lungs every 6 (six) hours as needed  for wheezing or shortness of breath. 09/06/22   Christopher Savannah, PA-C  carvedilol  (COREG ) 12.5 MG tablet Take 1 tablet (12.5 mg total) by mouth 2 (two) times daily. 04/29/23   Bensimhon, Toribio SAUNDERS, MD  dapagliflozin  propanediol (FARXIGA ) 10 MG TABS tablet Take 1 tablet (10 mg total) by mouth daily before breakfast. 01/24/23    Bensimhon, Daniel R, MD  Menthol, Topical Analgesic, (BIOFREEZE EX) Apply 1 application topically daily as needed (pain).    [provider]  Naphazoline HCl (CLEAR EYES OP) Place 1 drop into both eyes daily as needed (redness/ irritation).    [provider]  nicotine  (NICODERM CQ  - DOSED IN MG/24 HOURS) 21 mg/24hr patch Place 21 mg onto the skin daily as needed (nicotine  dependence).    [provider]  sacubitril -valsartan  (ENTRESTO ) 97-103 MG Take 1 tablet by mouth 2 (two) times daily. 01/24/23   Bensimhon, Toribio SAUNDERS, MD  spironolactone  (ALDACTONE ) 25 MG tablet Take 1 tablet (25 mg total) by mouth daily. 04/29/23   Bensimhon, Toribio SAUNDERS, MD    Family History Family History  Problem Relation Age of Onset   Heart failure Father    Diabetes Father    Kidney disease Father    Heart disease Father 23   Heart disease Maternal Grandfather 32   Heart attack Maternal Grandfather    Heart disease Paternal Grandfather 47   Lung cancer Maternal Aunt     Social History Social History[1]   Allergies   Bactrim [sulfamethoxazole-trimethoprim]   Review of Systems Review of Systems  Constitutional:  Positive for chills and fatigue. Negative for fever.  HENT:  Positive for congestion and rhinorrhea. Negative for sinus pressure, sinus pain and sore throat.   Respiratory:  Positive for cough and shortness of breath.   Cardiovascular:  Negative for chest pain.  Gastrointestinal:  Positive for abdominal pain and diarrhea. Negative for nausea and vomiting.  Musculoskeletal:  Negative for myalgias.  Neurological:  Positive for dizziness and headaches. Negative for weakness and light-headedness.     Physical Exam Triage Vital Signs ED Triage Vitals  Encounter Vitals Group     BP 06/03/24 1149 122/85     Girls Systolic BP Percentile --      Girls Diastolic BP Percentile --      Boys Systolic BP Percentile --      Boys Diastolic BP Percentile --      Pulse Rate  06/03/24 1149 81     Resp 06/03/24 1149 18     Temp 06/03/24 1149 98.9 F (37.2 C)     Temp Source 06/03/24 1149 Oral     SpO2 06/03/24 1149 97 %     Weight --      Height --      Head Circumference --      Peak Flow --      Pain Score 06/03/24 1148 7     Pain Loc --      Pain Education --      Exclude from Growth Chart --    No data found.  Updated Vital Signs BP 122/85 (BP Location: Right Arm)   Pulse 81   Temp 98.9 F (37.2 C) (Oral)   Resp 18   SpO2 97%       Physical Exam Vitals and nursing note reviewed.  Constitutional:      General: He is not in acute distress.    Appearance: Normal appearance. He is well-developed. He is not ill-appearing.  HENT:  Head: Normocephalic and atraumatic.     Right Ear: Tympanic membrane, ear canal and external ear normal.     Left Ear: Tympanic membrane, ear canal and external ear normal.     Nose: Congestion present.     Mouth/Throat:     Mouth: Mucous membranes are moist.     Pharynx: Oropharynx is clear.  Eyes:     General: No scleral icterus.    Conjunctiva/sclera: Conjunctivae normal.  Cardiovascular:     Rate and Rhythm: Normal rate and regular rhythm.  Pulmonary:     Effort: Pulmonary effort is normal. No respiratory distress.     Breath sounds: Normal breath sounds.  Musculoskeletal:     Cervical back: Neck supple.  Skin:    General: Skin is warm and dry.     Capillary Refill: Capillary refill takes less than 2 seconds.  Neurological:     General: No focal deficit present.     Mental Status: He is alert. Mental status is at baseline.     Motor: No weakness.     Gait: Gait normal.  Psychiatric:        Mood and Affect: Mood normal.        Behavior: Behavior normal.      UC Treatments / Results  Labs (all labs ordered are listed, but only abnormal results are displayed) Labs Reviewed  POC COVID19/FLU A&B COMBO - Normal    EKG   Radiology DG Chest 2 View Result Date: 06/03/2024 CLINICAL DATA:   Productive cough. EXAM: CHEST - 2 VIEW COMPARISON:  09/06/2022 FINDINGS: The heart size and mediastinal contours are within normal limits. Both lungs are clear. The visualized skeletal structures are unremarkable. IMPRESSION: No active cardiopulmonary disease. Electronically Signed   By: Norleen DELENA Kil M.D.   On: 06/03/2024 12:40    Procedures Procedures (including critical care time)  Medications Ordered in UC Medications - No data to display  Initial Impression / Assessment and Plan / UC Course  I have reviewed the triage vital signs and the nursing notes.  Pertinent labs & imaging results that were available during my care of the patient were reviewed by me and considered in my medical decision making (see chart for details).   44 year old male with history of CHF, alcohol  abuse and asthma presents for onset of fatigue, cough, congestion, runny nose, dizziness, headaches, abdominal cramping and diarrhea yesterday.  Vitals are normal and stable.  Overall well-appearing.  No acute distress.  On exam has nasal congestion.  Throat clear.  Chest clear.  Heart regular rate and rhythm.  Rapid COVID/flu negative.  Chest x-ray obtained.  Normal.  Viral illness.  Supportive care encouraged with increased rest and fluids.  Sent Promethazine  DM to pharmacy.  I advised to use inhaler if needed.  Reviewed typical course of most viral illnesses.  Discussed return precautions.   Final Clinical Impressions(s) / UC Diagnoses   Final diagnoses:  Acute cough  Viral upper respiratory tract infection  Diarrhea, unspecified type  Shortness of breath  Asthma, unspecified asthma severity, unspecified whether complicated, unspecified whether persistent     Discharge Instructions      -Negative flu and COVID -Chest x-ray  normal   URI/COLD SYMPTOMS: Your exam today is consistent with a viral illness. Antibiotics are not indicated at this time. Use medications as directed, including cough syrup, nasal  saline, and decongestants. Your symptoms should improve over the next few days and resolve within 7-10 days. Increase rest and fluids. F/u if  symptoms worsen or predominate such as sore throat, ear pain, productive cough, shortness of breath, or if you develop high fevers or worsening fatigue over the next several days.        ED Prescriptions     Medication Sig Dispense Auth. Provider   promethazine -dextromethorphan (PROMETHAZINE -DM) 6.25-15 MG/5ML syrup Take 5 mLs by mouth 4 (four) times daily as needed. 118 mL Arvis Jolan NOVAK, PA-C      PDMP not reviewed this encounter.     [1]  Social History Tobacco Use   Smoking status: Some Days    Current packs/day: 0.25    Average packs/day: 0.3 packs/day for 24.0 years (6.0 ttl pk-yrs)    Types: Cigarettes    Passive exposure: Current   Smokeless tobacco: Never  Vaping Use   Vaping status: Some Days  Substance Use Topics   Alcohol  use: Not Currently    Alcohol /week: 8.0 standard drinks of alcohol     Types: 8 Shots of liquor per week   Drug use: Not Currently     Arvis Jolan NOVAK DEVONNA 06/03/24 1248  "

## 2024-06-03 NOTE — ED Triage Notes (Signed)
 Onset yesterday with congestion, dizziness, anxiety, headache, and productive cough. States the cough is ongoing for him. Had diarrhea and stomach pain as well yesterday.   Patient tried tylenol  and Mucinex with mild relief.

## 2024-06-03 NOTE — Discharge Instructions (Addendum)
-  Negative flu and COVID -Chest x-ray  normal   URI/COLD SYMPTOMS: Your exam today is consistent with a viral illness. Antibiotics are not indicated at this time. Use medications as directed, including cough syrup, nasal saline, and decongestants. Your symptoms should improve over the next few days and resolve within 7-10 days. Increase rest and fluids. F/u if symptoms worsen or predominate such as sore throat, ear pain, productive cough, shortness of breath, or if you develop high fevers or worsening fatigue over the next several days.

## 2024-06-08 ENCOUNTER — Telehealth (HOSPITAL_COMMUNITY): Payer: Self-pay

## 2024-06-08 NOTE — Telephone Encounter (Signed)
 Called to confirm/remind patient of their appointment at the [facility] on 06/09/24 3:30.   Appointment:   [x] Confirmed  [] Left mess   [] No answer/No voice mail  [] VM Full/unable to leave message  [] Phone not in service  Patient reminded to bring all medications and/or complete list.  Confirmed patient has transportation. Gave directions, instructed to utilize valet parking.

## 2024-06-09 ENCOUNTER — Ambulatory Visit (HOSPITAL_COMMUNITY)

## 2024-06-09 NOTE — Progress Notes (Incomplete)
 "  [facility]                 PCP: Roselie Mood NP HF Cardiologist: Dr. Cherrie    Reason for Visit: Follow up for Chronic Systolic Heart Failure  HPI: [name] is a 44 y.o. male with h/o heavy ETOH and cocaine use, PE,  biventricular systolic heart failure and tobacco abuse.   Admitted with acute onset biventricular HF in 5/21. EF 15%. He was drinking at least 2 beers every night and about 12-24 over the weekend. Also drinking 1/5 of liqour every other day. Cath showed normal coronaries. Elevated filling pressures with normal output. Required milrinone . 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 CT showed small LLL PE. Placed on xarelto . Started on HF meds.  Discharge weight 178 pounds.   Readmitted 6/21 for mild acute blood loss related anemia. EGD showed no active bleeding but evidence of some gastritis. PPI increased. Repeat EGD/colonoscopy 1/23 had 1 small polyp removed, normal esophagus.  Has had numerous ED visits and admits for ETOH withdrawal and AKI. Reports stopping ETOH in 7/22  Echo 10/21 EF 30-35% mild RV HK.  Echo 3/23 EF 40-45%   Follow up 5/23, lasix  switched form daily to PRN.  Echo 9/23 EF 40-45%, RV normal.  Follow up 11/23, Zio placed for palpitations. 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   Presents for ED f/u visit. Has been doing well. 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 Has had neck pain going down to shoulder for several weeks.    Compliant with HF meds. No ETOH/drug use.    Zio 1/24 1 brief run NSVT.   Cardiac studies - Echo (5/21): EF <20%  Prominent trabeculation. Grade III DD  - R/LHC (5/21): showed normal coronaries, elevated filling pressures with normal output.  - cMRI (5/21): showed Noncompaction- LVEF 14% RVEF 16%  Severely dialted R/L atrium - Echo (6/21): Pericardial effusion remains moderate in size. No tamponade physiology.  - Echo (10/21): EF  30-35% RV normal. No effusion.  - Echo (2/22): EF 30% - Echo (9/23): EF 40-45%, RV normal  - Echo (1/25): EF 45-50%, RV normal   ROS: All systems negative except as listed in HPI, PMH and Problem List.  SH:  Social History   Socioeconomic History   Marital status: Single    Spouse name: Not on file   Number of children: Not on file   Years of education: Not on file   Highest education level: Not on file  Occupational History   Occupation: Runner, [employer]  Tobacco Use   Smoking status: Some Days    Current packs/day: 0.25    Average packs/day: 0.3 packs/day for 24.0 years (6.0 ttl pk-yrs)    Types: Cigarettes    Passive exposure: Current   Smokeless tobacco: Never  Vaping Use   Vaping status: Some Days  Substance and Sexual Activity   Alcohol  use: Not Currently    Alcohol /week: 8.0 standard drinks of alcohol     Types: 8 Shots of liquor per week   Drug use: Not Currently   Sexual activity: Yes  Other Topics Concern   Not on file  Social History Narrative   Not on file   Social Drivers of Health   Tobacco Use: High Risk (06/03/2024)   Patient History    Smoking Tobacco Use: Some Days    Smokeless Tobacco Use: Never    Passive Exposure: Current  Physicist, Medical  Strain: Not on file  Food Insecurity: Not on file  Transportation Needs: Not on file  Physical Activity: Not on file  Stress: Not on file  Social Connections: Not on file  Intimate Partner Violence: Not At Risk (12/09/2023)   Received from [facility]   Epic    Within the last year, have you been afraid of your partner or ex-partner?: No    Within the last year, have you been humiliated or emotionally abused in other ways by your partner or ex-partner?: No    Within the last year, have you been kicked, hit, slapped, or otherwise physically hurt by your partner or ex-partner?: No    Within the last year, have you been raped or forced to have any kind of sexual activity by your partner or  ex-partner?: No  Depression (PHQ2-9): Not on file  Alcohol  Screen: Not on file  Housing: Not on file  Utilities: Not on file  Health Literacy: Not on file   FH:  Family History  Problem Relation Age of Onset   Heart failure Father    Diabetes Father    Kidney disease Father    Heart disease Father 72   Heart disease Maternal Grandfather 62   Heart attack Maternal Grandfather    Heart disease Paternal Grandfather 60   Lung cancer Maternal Aunt    Past Medical History:  Diagnosis Date   AKI (acute kidney injury) 10/30/2019   Alcohol  use    Alcohol  withdrawal (HCC) 03/06/2020   Anxiety    Asthma    CHF (congestive heart failure) (HCC)    Depression    GERD (gastroesophageal reflux disease)    Pulmonary embolism (HCC)    Seizure (HCC)    Current Outpatient Medications  Medication Sig Dispense Refill   albuterol  (VENTOLIN  HFA) 108 (90 Base) MCG/ACT inhaler Inhale 1-2 puffs into the lungs every 6 (six) hours as needed for wheezing or shortness of breath. 18 g 0   carvedilol  (COREG ) 12.5 MG tablet Take 1 tablet (12.5 mg total) by mouth 2 (two) times daily. 60 tablet 6   dapagliflozin  propanediol (FARXIGA ) 10 MG TABS tablet Take 1 tablet (10 mg total) by mouth daily before breakfast. 90 tablet 3   Menthol, Topical Analgesic, (BIOFREEZE EX) Apply 1 application topically daily as needed (pain).     Naphazoline HCl (CLEAR EYES OP) Place 1 drop into both eyes daily as needed (redness/ irritation).     nicotine  (NICODERM CQ  - DOSED IN MG/24 HOURS) 21 mg/24hr patch Place 21 mg onto the skin daily as needed (nicotine  dependence).     promethazine -dextromethorphan (PROMETHAZINE -DM) 6.25-15 MG/5ML syrup Take 5 mLs by mouth 4 (four) times daily as needed. 118 mL 0   sacubitril -valsartan  (ENTRESTO ) 97-103 MG Take 1 tablet by mouth 2 (two) times daily. 180 tablet 3   spironolactone  (ALDACTONE ) 25 MG tablet Take 1 tablet (25 mg total) by mouth daily. 90 tablet 3   No current  facility-administered medications for this visit.   Wt Readings from Last 3 Encounters:  04/29/23 94.3 kg (207 lb 12.8 oz)  11/20/22 87.9 kg (193 lb 12.8 oz)  08/09/22 82.6 kg (182 lb 3.2 oz)   There were no vitals taken for this visit.  Physical Exam  GENERAL: NAD Lungs- *** CARDIAC:  JVP: *** cm          Normal rate with regular rhythm. *** murmur.  Pulses ***. *** edema.  ABDOMEN: Soft, non-tender, non-distended.  EXTREMITIES: Warm and well perfused.  NEUROLOGIC: No obvious FND     ECG *** Personally reviewed   ASSESSMENT & PLAN:   1. Chronic Systolic Heart Failure, NICM  - Echo (5/21): EF 15% RV moderately reduced. Suspect ETOH and cocaine playing a role.  - LHC (5/21): normal cors. 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  - Echo (10/21): EF 30-35% RV normal. No effusion.  - Echo (2/22): EF 30%  - Echo 3/23 EF 40-45%  - Echo 9/23 EF 40-45%, RV normal - Echo 5/24 EF 50-55% RV ok   - Echo 1/25 EF 45-50%, RV ok  - NYHA - Volume  - Continue Lasix  20 mg PRN  - Continue Spironolactone  25 mg daily  - Continue Entresto  97-103 mg bid.  - Continue Coreg  12.5 mg bid  - Continue Farxiga  10 mg daily. - Plan was to refer for genetic counseling with Dr. Fairy, pt appt never made. 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 Will place referral  - update echo  - check BMP today   2. H/o Pulmonary Emboli - CTA w/ small LLL PE 5/21. - LE Venous dopplers negative for DVT.  - Given small PE. Risk of ongoing AC likely outweighs benefit. - Off Xarelto   3. ETOH Abuse - Heavy drinker for many years since the age of 68.  - 01/2020 had 28 day detox [facility] but started drinking again.  - Has remained sober since 11/2020.  - No change  4. H/o Cocaine Abuse - Denies recent use.  - UDS 10/08/20 negative.  5. Tobacco Abuse - Down to 1-2 cig/daily. -  Working on quitting  6. Palpitations  - Zio 1/24 ok   7. HTN - ***   Robert Shed, PA-C 06/09/24 11:51 AM "

## 2024-06-17 ENCOUNTER — Telehealth (HOSPITAL_COMMUNITY): Payer: Self-pay

## 2024-06-17 NOTE — Telephone Encounter (Signed)
 Called to confirm/remind patient of their appointment at the [facility] on 06/17/24.   Appointment:   [] Confirmed  [] Left mess   [] No answer/No voice mail  [] VM Full/unable to leave message  [] Phone not in service  Patient reminded to bring all medications and/or complete list.  Confirmed patient has transportation. Gave directions, instructed to utilize valet parking.   Patient rescheduled 2/18

## 2024-06-17 NOTE — Progress Notes (Incomplete)
 "  [facility]                 PCP: Roselie Mood NP HF Cardiologist: Dr. Cherrie    Reason for Visit: Follow up for Chronic Systolic Heart Failure  HPI: Robert [name] is a 44 y.o. male with h/o heavy ETOH and cocaine use, PE,  biventricular systolic heart failure and tobacco abuse.   Admitted with acute onset biventricular HF in 5/21. EF 15%. He was drinking at least 2 beers every night and about 12-24 over the weekend. Also drinking 1/5 of liqour every other day. Cath showed normal coronaries. Elevated filling pressures with normal output. Required milrinone . 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 CT showed small LLL PE. Placed on xarelto . Started on HF meds.  Discharge weight 178 pounds.   Readmitted 6/21 for mild acute blood loss related anemia. EGD showed no active bleeding but evidence of some gastritis. PPI increased. Repeat EGD/colonoscopy 1/23 had 1 small polyp removed, normal esophagus.  Has had numerous ED visits and admits for ETOH withdrawal and AKI. Reports stopping ETOH in 7/22  Echo 10/21 EF 30-35% mild RV HK.  Echo 3/23 EF 40-45%   Follow up 5/23, lasix  switched form daily to PRN.  Echo 9/23 EF 40-45%, RV normal.  Follow up 11/23, Zio placed for palpitations. 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   Echo 5/24: EF 50-55%.   Last seen in AHF clinic 12/24. Seen at [facility] ED with costochondritis and CHF exacerbation. Seen at [facility] ED 7/25 with chest pain and ETOH withdrawal. Reported drinking several bottles of wine daily. Overall workup negative.   Echo 1/25: EF 45-50%, LV with GHK, RV normal, trivial MR. Today he returns for AHF follow up. Overall feeling ***. Denies palpitations, CP, dizziness, edema, or PND/Orthopnea. *** SOB. Appetite ok. No fever or chills. Weight at home *** pounds. Taking all medications. Denies ETOH, tobacco or drug use.   Compliant with HF meds. No ETOH/drug  use.   Cardiac studies - Echo (5/21): EF <20%  Prominent trabeculation. Grade III DD  - R/LHC (5/21): showed normal coronaries, elevated filling pressures with normal output.  - cMRI (5/21): showed Noncompaction- LVEF 14% RVEF 16%  Severely dialted R/L [facility] - Echo (6/21): Pericardial effusion remains moderate in size. No tamponade physiology.  - Echo (10/21): EF 30-35% RV normal. No effusion.  - Echo (2/22): EF 30% - Echo (9/23): EF 40-45%, RV normal  - Echo 5/24: EF 50-55%.  - Echo (1/25): EF 45-50%, RV normal   Zio 1/24 1 brief run NSVT.   ROS: All systems negative except as listed in HPI, PMH and Problem List.  SH:  Social History   Socioeconomic History   Marital status: Single    Spouse name: Not on file   Number of children: Not on file   Years of education: Not on file   Highest education level: Not on file  Occupational History   Occupation: Runner, [employer]  Tobacco Use   Smoking status: Some Days    Current packs/day: 0.25    Average packs/day: 0.3 packs/day for 24.0 years (6.0 ttl pk-yrs)    Types: Cigarettes    Passive exposure: Current   Smokeless tobacco: Never  Vaping Use   Vaping status: Some Days  Substance and Sexual Activity   Alcohol  use: Not Currently    Alcohol /week: 8.0 standard drinks of alcohol     Types: 8 Shots of  liquor per week   Drug use: Not Currently   Sexual activity: Yes  Other Topics Concern   Not on file  Social History Narrative   Not on file   Social Drivers of Health   Tobacco Use: High Risk (06/03/2024)   Patient History    Smoking Tobacco Use: Some Days    Smokeless Tobacco Use: Never    Passive Exposure: Current  Financial Resource Strain: Not on file  Food Insecurity: Not on file  Transportation Needs: Not on file  Physical Activity: Not on file  Stress: Not on file  Social Connections: Not on file  Intimate Partner Violence: Not At Risk (12/09/2023)   Received from [facility]   Epic    Within the last  year, have you been afraid of your partner or ex-partner?: No    Within the last year, have you been humiliated or emotionally abused in other ways by your partner or ex-partner?: No    Within the last year, have you been kicked, hit, slapped, or otherwise physically hurt by your partner or ex-partner?: No    Within the last year, have you been raped or forced to have any kind of sexual activity by your partner or ex-partner?: No  Depression (PHQ2-9): Not on file  Alcohol  Screen: Not on file  Housing: Not on file  Utilities: Not on file  Health Literacy: Not on file   FH:  Family History  Problem Relation Age of Onset   Heart failure Father    Diabetes Father    Kidney disease Father    Heart disease Father 70   Heart disease Maternal Grandfather 72   Heart attack Maternal Grandfather    Heart disease Paternal Grandfather 72   Lung cancer Maternal Aunt    Past Medical History:  Diagnosis Date   AKI (acute kidney injury) 10/30/2019   Alcohol  use    Alcohol  withdrawal (HCC) 03/06/2020   Anxiety    Asthma    CHF (congestive heart failure) (HCC)    Depression    GERD (gastroesophageal reflux disease)    Pulmonary embolism (HCC)    Seizure (HCC)    Current Outpatient Medications  Medication Sig Dispense Refill   albuterol  (VENTOLIN  HFA) 108 (90 Base) MCG/ACT inhaler Inhale 1-2 puffs into the lungs every 6 (six) hours as needed for wheezing or shortness of breath. 18 g 0   carvedilol  (COREG ) 12.5 MG tablet Take 1 tablet (12.5 mg total) by mouth 2 (two) times daily. 60 tablet 6   dapagliflozin  propanediol (FARXIGA ) 10 MG TABS tablet Take 1 tablet (10 mg total) by mouth daily before breakfast. 90 tablet 3   Menthol, Topical Analgesic, (BIOFREEZE EX) Apply 1 application topically daily as needed (pain).     Naphazoline HCl (CLEAR EYES OP) Place 1 drop into both eyes daily as needed (redness/ irritation).     nicotine  (NICODERM CQ  - DOSED IN MG/24 HOURS) 21 mg/24hr patch Place 21 mg  onto the skin daily as needed (nicotine  dependence).     promethazine -dextromethorphan (PROMETHAZINE -DM) 6.25-15 MG/5ML syrup Take 5 mLs by mouth 4 (four) times daily as needed. 118 mL 0   sacubitril -valsartan  (ENTRESTO ) 97-103 MG Take 1 tablet by mouth 2 (two) times daily. 180 tablet 3   spironolactone  (ALDACTONE ) 25 MG tablet Take 1 tablet (25 mg total) by mouth daily. 90 tablet 3   No current facility-administered medications for this visit.   Wt Readings from Last 3 Encounters:  04/29/23 94.3 kg (207 lb  12.8 oz)  11/20/22 87.9 kg (193 lb 12.8 oz)  08/09/22 82.6 kg (182 lb 3.2 oz)   There were no vitals taken for this visit.  Physical Exam  General:  *** appearing.  No respiratory difficulty Neck: JVD *** cm.  Cor: Regular rate & rhythm. No murmurs. Lungs: clear Extremities: no edema  Neuro: alert & oriented x 3. Affect pleasant.   ECG *** Personally reviewed   ASSESSMENT & PLAN: 1. Chronic Systolic Heart Failure, NICM  - Echo (5/21): EF 15% RV moderately reduced. Suspect ETOH and cocaine playing a role.  - LHC (5/21): normal cors. 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  - Echo (10/21): EF 30-35% RV normal. No effusion.  - Echo (2/22): EF 30%  - Echo 3/23 EF 40-45%  - Echo 9/23 EF 40-45%, RV normal - Echo 5/24 EF 50-55% RV ok   - Echo 1/25 EF 45-50%, RV ok  - NYHA - Volume  - Continue Lasix  20 mg PRN  - Continue Spironolactone  25 mg daily  - Continue Entresto  97-103 mg bid.  - Continue Coreg  12.5 mg bid  - Continue Farxiga  10 mg daily. - Plan was to refer for genetic counseling with Dr. Fairy, pt appt never made. 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 Will place referral  - update echo  - check BMP today   2. H/o Pulmonary Emboli - CTA w/ small LLL PE 5/21. - LE Venous dopplers negative for DVT.  - Given small PE. Risk of ongoing AC likely  outweighs benefit. - Off Xarelto   3. ETOH Abuse - Heavy drinker for many years since the age of 81.  - 01/2020 had 28 day detox [facility] but started drinking again.  - Has remained sober since 11/2020.  - No change ***  4. H/o Cocaine Abuse - Denies recent use.  - UDS 10/08/20 negative.  5. Tobacco Abuse - Down to 1-2 cig/daily. - Working on quitting  6. Palpitations  - Zio 1/24 ok   7. HTN - ***   Follow up ***  Robert LITTIE Coe, NP 06/17/24 10:51 AM "

## 2024-06-18 ENCOUNTER — Ambulatory Visit (HOSPITAL_COMMUNITY): Payer: Self-pay

## 2024-06-30 ENCOUNTER — Ambulatory Visit (HOSPITAL_COMMUNITY): Payer: Self-pay
